# Patient Record
Sex: Female | Born: 1935 | Race: Black or African American | Hispanic: No | Smoking: Never smoker
Health system: Southern US, Community
[De-identification: ages and names within clinical notes are randomized; demographics above are authoritative.]

## PROBLEM LIST (undated history)

## (undated) ENCOUNTER — Emergency Department (HOSPITAL_COMMUNITY): Disposition: A | Discharge status: Home / Self Care | Payer: PRIVATE HEALTH INSURANCE

## (undated) DIAGNOSIS — M1990 Unspecified osteoarthritis, unspecified site: Secondary | ICD-10-CM

## (undated) DIAGNOSIS — I4719 Other supraventricular tachycardia: Secondary | ICD-10-CM

## (undated) DIAGNOSIS — I872 Venous insufficiency (chronic) (peripheral): Secondary | ICD-10-CM

## (undated) DIAGNOSIS — I4729 Other ventricular tachycardia: Secondary | ICD-10-CM

## (undated) DIAGNOSIS — I4821 Permanent atrial fibrillation: Secondary | ICD-10-CM

## (undated) DIAGNOSIS — E7800 Pure hypercholesterolemia, unspecified: Secondary | ICD-10-CM

## (undated) DIAGNOSIS — J189 Pneumonia, unspecified organism: Secondary | ICD-10-CM

## (undated) DIAGNOSIS — Z9981 Dependence on supplemental oxygen: Secondary | ICD-10-CM

## (undated) DIAGNOSIS — I3139 Other pericardial effusion (noninflammatory): Secondary | ICD-10-CM

## (undated) DIAGNOSIS — M301 Polyarteritis with lung involvement [Churg-Strauss]: Secondary | ICD-10-CM

## (undated) DIAGNOSIS — M1120 Other chondrocalcinosis, unspecified site: Secondary | ICD-10-CM

## (undated) DIAGNOSIS — I776 Arteritis, unspecified: Secondary | ICD-10-CM

## (undated) DIAGNOSIS — I10 Essential (primary) hypertension: Secondary | ICD-10-CM

## (undated) DIAGNOSIS — E876 Hypokalemia: Secondary | ICD-10-CM

## (undated) DIAGNOSIS — E0500 Thyrotoxicosis with diffuse goiter without thyrotoxic crisis or storm: Secondary | ICD-10-CM

## (undated) DIAGNOSIS — I509 Heart failure, unspecified: Secondary | ICD-10-CM

## (undated) DIAGNOSIS — J90 Pleural effusion, not elsewhere classified: Secondary | ICD-10-CM

## (undated) DIAGNOSIS — I5042 Chronic combined systolic (congestive) and diastolic (congestive) heart failure: Secondary | ICD-10-CM

## (undated) DIAGNOSIS — I471 Supraventricular tachycardia: Secondary | ICD-10-CM

## (undated) DIAGNOSIS — Z8489 Family history of other specified conditions: Secondary | ICD-10-CM

## (undated) DIAGNOSIS — I313 Pericardial effusion (noninflammatory): Secondary | ICD-10-CM

## (undated) DIAGNOSIS — E119 Type 2 diabetes mellitus without complications: Secondary | ICD-10-CM

## (undated) DIAGNOSIS — I38 Endocarditis, valve unspecified: Secondary | ICD-10-CM

## (undated) DIAGNOSIS — I272 Pulmonary hypertension, unspecified: Secondary | ICD-10-CM

## (undated) DIAGNOSIS — I472 Ventricular tachycardia: Secondary | ICD-10-CM

## (undated) DIAGNOSIS — D7218 Eosinophilia in diseases classified elsewhere: Secondary | ICD-10-CM

## (undated) DIAGNOSIS — M8580 Other specified disorders of bone density and structure, unspecified site: Secondary | ICD-10-CM

## (undated) DIAGNOSIS — R0602 Shortness of breath: Secondary | ICD-10-CM

## (undated) HISTORY — DX: Other chondrocalcinosis, unspecified site: M11.20

## (undated) HISTORY — DX: Arteritis, unspecified: I77.6

## (undated) HISTORY — DX: Thyrotoxicosis with diffuse goiter without thyrotoxic crisis or storm: E05.00

## (undated) HISTORY — PX: CATARACT EXTRACTION: SUR2

## (undated) HISTORY — DX: Venous insufficiency (chronic) (peripheral): I87.2

## (undated) HISTORY — PX: ABDOMINAL HYSTERECTOMY: SHX81

## (undated) HISTORY — DX: Other specified disorders of bone density and structure, unspecified site: M85.80

## (undated) HISTORY — DX: Polyarteritis with lung involvement (churg-strauss): M30.1

## (undated) HISTORY — PX: SALPINGOOPHORECTOMY: SHX82

## (undated) HISTORY — DX: Unspecified osteoarthritis, unspecified site: M19.90

## (undated) HISTORY — PX: THYROIDECTOMY: SHX17

## (undated) HISTORY — DX: Hypokalemia: E87.6

## (undated) HISTORY — DX: Essential (primary) hypertension: I10

## (undated) HISTORY — PX: FRACTURE SURGERY: SHX138

## (undated) HISTORY — DX: Pure hypercholesterolemia, unspecified: E78.00

## (undated) HISTORY — DX: Eosinophilia in diseases classified elsewhere: D72.18

---
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
## 1998-03-14 ENCOUNTER — Ambulatory Visit (HOSPITAL_COMMUNITY): Admission: RE | Admit: 1998-03-14 | Discharge: 1998-03-14 | Payer: Self-pay | Admitting: Specialist

## 1998-03-19 ENCOUNTER — Encounter: Admission: RE | Admit: 1998-03-19 | Discharge: 1998-03-19 | Payer: Self-pay | Admitting: Internal Medicine

## 1998-03-21 ENCOUNTER — Encounter: Admission: RE | Admit: 1998-03-21 | Discharge: 1998-03-21 | Payer: Self-pay | Admitting: Hematology and Oncology

## 1998-04-24 ENCOUNTER — Encounter: Admission: RE | Admit: 1998-04-24 | Discharge: 1998-04-24 | Payer: Self-pay | Admitting: Internal Medicine

## 1998-08-21 ENCOUNTER — Encounter: Admission: RE | Admit: 1998-08-21 | Discharge: 1998-08-21 | Payer: Self-pay | Admitting: Hematology and Oncology

## 1998-11-03 ENCOUNTER — Inpatient Hospital Stay (HOSPITAL_COMMUNITY): Admission: EM | Admit: 1998-11-03 | Discharge: 1998-11-06 | Payer: Self-pay | Admitting: Emergency Medicine

## 1998-11-03 ENCOUNTER — Encounter: Payer: Self-pay | Admitting: Emergency Medicine

## 1998-11-04 ENCOUNTER — Encounter: Payer: Self-pay | Admitting: Internal Medicine

## 1998-11-06 ENCOUNTER — Encounter: Payer: Self-pay | Admitting: Internal Medicine

## 1998-12-26 ENCOUNTER — Encounter: Admission: RE | Admit: 1998-12-26 | Discharge: 1998-12-26 | Payer: Self-pay | Admitting: Internal Medicine

## 1999-04-10 ENCOUNTER — Encounter: Admission: RE | Admit: 1999-04-10 | Discharge: 1999-04-10 | Payer: Self-pay | Admitting: Internal Medicine

## 1999-04-17 ENCOUNTER — Encounter: Admission: RE | Admit: 1999-04-17 | Discharge: 1999-04-17 | Payer: Self-pay | Admitting: Internal Medicine

## 1999-05-18 ENCOUNTER — Encounter: Payer: Self-pay | Admitting: Emergency Medicine

## 1999-05-18 ENCOUNTER — Emergency Department (HOSPITAL_COMMUNITY): Admission: EM | Admit: 1999-05-18 | Discharge: 1999-05-18 | Payer: Self-pay | Admitting: Emergency Medicine

## 1999-05-21 ENCOUNTER — Ambulatory Visit (HOSPITAL_COMMUNITY): Admission: RE | Admit: 1999-05-21 | Discharge: 1999-05-21 | Payer: Self-pay | Admitting: *Deleted

## 1999-05-30 ENCOUNTER — Ambulatory Visit (HOSPITAL_COMMUNITY): Admission: RE | Admit: 1999-05-30 | Discharge: 1999-05-30 | Payer: Self-pay | Admitting: *Deleted

## 1999-06-26 ENCOUNTER — Emergency Department (HOSPITAL_COMMUNITY): Admission: EM | Admit: 1999-06-26 | Discharge: 1999-06-26 | Payer: Self-pay | Admitting: Emergency Medicine

## 1999-06-26 ENCOUNTER — Encounter: Payer: Self-pay | Admitting: Emergency Medicine

## 1999-06-28 ENCOUNTER — Encounter: Admission: RE | Admit: 1999-06-28 | Discharge: 1999-06-28 | Payer: Self-pay | Admitting: Internal Medicine

## 1999-07-07 ENCOUNTER — Inpatient Hospital Stay (HOSPITAL_COMMUNITY): Admission: EM | Admit: 1999-07-07 | Discharge: 1999-07-08 | Payer: Self-pay | Admitting: Emergency Medicine

## 1999-07-07 ENCOUNTER — Encounter: Payer: Self-pay | Admitting: Internal Medicine

## 1999-07-07 ENCOUNTER — Encounter: Payer: Self-pay | Admitting: Emergency Medicine

## 1999-07-08 ENCOUNTER — Encounter: Payer: Self-pay | Admitting: Internal Medicine

## 1999-08-18 ENCOUNTER — Emergency Department (HOSPITAL_COMMUNITY): Admission: EM | Admit: 1999-08-18 | Discharge: 1999-08-18 | Payer: Self-pay | Admitting: Emergency Medicine

## 1999-08-18 ENCOUNTER — Encounter: Payer: Self-pay | Admitting: Internal Medicine

## 1999-09-21 ENCOUNTER — Inpatient Hospital Stay (HOSPITAL_COMMUNITY): Admission: EM | Admit: 1999-09-21 | Discharge: 1999-09-27 | Payer: Self-pay | Admitting: Emergency Medicine

## 1999-09-21 ENCOUNTER — Encounter: Payer: Self-pay | Admitting: Emergency Medicine

## 1999-09-21 ENCOUNTER — Encounter (INDEPENDENT_AMBULATORY_CARE_PROVIDER_SITE_OTHER): Payer: Self-pay | Admitting: Specialist

## 1999-09-23 ENCOUNTER — Encounter: Payer: Self-pay | Admitting: Internal Medicine

## 1999-09-26 ENCOUNTER — Encounter: Payer: Self-pay | Admitting: Internal Medicine

## 1999-10-01 ENCOUNTER — Encounter: Admission: RE | Admit: 1999-10-01 | Discharge: 1999-10-01 | Payer: Self-pay | Admitting: Internal Medicine

## 1999-10-28 ENCOUNTER — Encounter: Admission: RE | Admit: 1999-10-28 | Discharge: 1999-10-28 | Payer: Self-pay | Admitting: *Deleted

## 1999-10-30 ENCOUNTER — Encounter: Admission: RE | Admit: 1999-10-30 | Discharge: 1999-10-30 | Payer: Self-pay | Admitting: Hematology and Oncology

## 1999-12-04 ENCOUNTER — Ambulatory Visit (HOSPITAL_COMMUNITY): Admission: RE | Admit: 1999-12-04 | Discharge: 1999-12-04 | Payer: Self-pay | Admitting: *Deleted

## 1999-12-06 ENCOUNTER — Encounter: Admission: RE | Admit: 1999-12-06 | Discharge: 1999-12-06 | Payer: Self-pay | Admitting: Internal Medicine

## 1999-12-26 ENCOUNTER — Encounter: Admission: RE | Admit: 1999-12-26 | Discharge: 1999-12-26 | Payer: Self-pay | Admitting: Hematology and Oncology

## 2000-01-02 ENCOUNTER — Encounter: Admission: RE | Admit: 2000-01-02 | Discharge: 2000-01-02 | Payer: Self-pay | Admitting: Hematology and Oncology

## 2000-02-06 ENCOUNTER — Encounter: Admission: RE | Admit: 2000-02-06 | Discharge: 2000-02-06 | Payer: Self-pay | Admitting: Internal Medicine

## 2000-02-16 HISTORY — PX: SURAL NERVE BIOPSY: SUR153

## 2000-03-02 ENCOUNTER — Encounter: Admission: RE | Admit: 2000-03-02 | Discharge: 2000-03-02 | Payer: Self-pay | Admitting: Internal Medicine

## 2000-03-02 ENCOUNTER — Encounter: Payer: Self-pay | Admitting: Internal Medicine

## 2000-03-03 ENCOUNTER — Encounter: Payer: Self-pay | Admitting: Internal Medicine

## 2000-03-03 ENCOUNTER — Inpatient Hospital Stay (HOSPITAL_COMMUNITY): Admission: EM | Admit: 2000-03-03 | Discharge: 2000-03-13 | Payer: Self-pay | Admitting: Internal Medicine

## 2000-03-04 ENCOUNTER — Encounter: Payer: Self-pay | Admitting: Internal Medicine

## 2000-03-09 ENCOUNTER — Encounter: Payer: Self-pay | Admitting: Internal Medicine

## 2000-03-12 ENCOUNTER — Encounter: Payer: Self-pay | Admitting: Internal Medicine

## 2000-03-17 ENCOUNTER — Ambulatory Visit (HOSPITAL_COMMUNITY): Admission: RE | Admit: 2000-03-17 | Discharge: 2000-03-17 | Payer: Self-pay | Admitting: Neurosurgery

## 2000-03-17 ENCOUNTER — Encounter (INDEPENDENT_AMBULATORY_CARE_PROVIDER_SITE_OTHER): Payer: Self-pay | Admitting: Specialist

## 2000-04-02 ENCOUNTER — Encounter: Admission: RE | Admit: 2000-04-02 | Discharge: 2000-04-02 | Payer: Self-pay | Admitting: Internal Medicine

## 2000-04-22 ENCOUNTER — Encounter: Admission: RE | Admit: 2000-04-22 | Discharge: 2000-04-22 | Payer: Self-pay | Admitting: Hematology and Oncology

## 2000-05-01 ENCOUNTER — Encounter: Admission: RE | Admit: 2000-05-01 | Discharge: 2000-05-01 | Payer: Self-pay | Admitting: Internal Medicine

## 2000-05-18 ENCOUNTER — Encounter: Admission: RE | Admit: 2000-05-18 | Discharge: 2000-05-18 | Payer: Self-pay | Admitting: Internal Medicine

## 2000-05-25 ENCOUNTER — Encounter: Admission: RE | Admit: 2000-05-25 | Discharge: 2000-05-25 | Payer: Self-pay | Admitting: Internal Medicine

## 2000-07-01 ENCOUNTER — Encounter: Payer: Self-pay | Admitting: *Deleted

## 2000-07-01 ENCOUNTER — Ambulatory Visit (HOSPITAL_COMMUNITY): Admission: RE | Admit: 2000-07-01 | Discharge: 2000-07-01 | Payer: Self-pay | Admitting: *Deleted

## 2000-07-30 ENCOUNTER — Encounter: Admission: RE | Admit: 2000-07-30 | Discharge: 2000-07-30 | Payer: Self-pay | Admitting: Hematology and Oncology

## 2000-08-06 ENCOUNTER — Ambulatory Visit (HOSPITAL_COMMUNITY): Admission: RE | Admit: 2000-08-06 | Discharge: 2000-08-06 | Payer: Self-pay | Admitting: Hematology and Oncology

## 2000-08-27 ENCOUNTER — Encounter: Admission: RE | Admit: 2000-08-27 | Discharge: 2000-08-27 | Payer: Self-pay | Admitting: Internal Medicine

## 2000-09-28 ENCOUNTER — Encounter: Admission: RE | Admit: 2000-09-28 | Discharge: 2000-09-28 | Payer: Self-pay | Admitting: Internal Medicine

## 2000-11-11 ENCOUNTER — Ambulatory Visit (HOSPITAL_COMMUNITY): Admission: RE | Admit: 2000-11-11 | Discharge: 2000-11-11 | Payer: Self-pay

## 2000-11-23 ENCOUNTER — Encounter: Admission: RE | Admit: 2000-11-23 | Discharge: 2000-11-23 | Payer: Self-pay | Admitting: Internal Medicine

## 2001-01-19 ENCOUNTER — Encounter: Payer: Self-pay | Admitting: *Deleted

## 2001-01-19 ENCOUNTER — Encounter: Admission: RE | Admit: 2001-01-19 | Discharge: 2001-01-19 | Payer: Self-pay | Admitting: *Deleted

## 2001-01-25 ENCOUNTER — Encounter: Admission: RE | Admit: 2001-01-25 | Discharge: 2001-01-25 | Payer: Self-pay | Admitting: Internal Medicine

## 2001-04-12 ENCOUNTER — Encounter: Admission: RE | Admit: 2001-04-12 | Discharge: 2001-04-12 | Payer: Self-pay | Admitting: Internal Medicine

## 2001-06-21 ENCOUNTER — Encounter (INDEPENDENT_AMBULATORY_CARE_PROVIDER_SITE_OTHER): Payer: Self-pay | Admitting: Internal Medicine

## 2001-07-06 ENCOUNTER — Encounter: Admission: RE | Admit: 2001-07-06 | Discharge: 2001-07-06 | Payer: Self-pay

## 2001-10-05 ENCOUNTER — Encounter: Admission: RE | Admit: 2001-10-05 | Discharge: 2001-10-05 | Payer: Self-pay | Admitting: Internal Medicine

## 2001-10-07 ENCOUNTER — Encounter: Admission: RE | Admit: 2001-10-07 | Discharge: 2001-10-07 | Payer: Self-pay | Admitting: Internal Medicine

## 2002-01-25 ENCOUNTER — Encounter: Admission: RE | Admit: 2002-01-25 | Discharge: 2002-01-25 | Payer: Self-pay | Admitting: Internal Medicine

## 2002-03-02 ENCOUNTER — Encounter: Payer: Self-pay | Admitting: Internal Medicine

## 2002-03-02 ENCOUNTER — Ambulatory Visit (HOSPITAL_COMMUNITY): Admission: RE | Admit: 2002-03-02 | Discharge: 2002-03-02 | Payer: Self-pay | Admitting: Internal Medicine

## 2002-05-04 ENCOUNTER — Encounter: Admission: RE | Admit: 2002-05-04 | Discharge: 2002-05-04 | Payer: Self-pay | Admitting: Internal Medicine

## 2002-06-01 ENCOUNTER — Encounter: Admission: RE | Admit: 2002-06-01 | Discharge: 2002-06-01 | Payer: Self-pay | Admitting: Internal Medicine

## 2002-06-01 ENCOUNTER — Encounter: Payer: Self-pay | Admitting: Cardiology

## 2002-06-01 ENCOUNTER — Ambulatory Visit (HOSPITAL_COMMUNITY): Admission: RE | Admit: 2002-06-01 | Discharge: 2002-06-01 | Payer: Self-pay | Admitting: Internal Medicine

## 2002-08-12 ENCOUNTER — Emergency Department (HOSPITAL_COMMUNITY): Admission: EM | Admit: 2002-08-12 | Discharge: 2002-08-12 | Payer: Self-pay | Admitting: Emergency Medicine

## 2002-08-19 ENCOUNTER — Encounter: Payer: Self-pay | Admitting: Emergency Medicine

## 2002-08-19 ENCOUNTER — Emergency Department (HOSPITAL_COMMUNITY): Admission: EM | Admit: 2002-08-19 | Discharge: 2002-08-19 | Payer: Self-pay | Admitting: Emergency Medicine

## 2002-11-21 ENCOUNTER — Encounter: Admission: RE | Admit: 2002-11-21 | Discharge: 2002-11-21 | Payer: Self-pay | Admitting: Internal Medicine

## 2003-04-06 ENCOUNTER — Encounter: Admission: RE | Admit: 2003-04-06 | Discharge: 2003-04-06 | Payer: Self-pay | Admitting: Internal Medicine

## 2003-04-27 ENCOUNTER — Emergency Department (HOSPITAL_COMMUNITY): Admission: AD | Admit: 2003-04-27 | Discharge: 2003-04-27 | Payer: Self-pay | Admitting: Emergency Medicine

## 2003-05-09 ENCOUNTER — Encounter: Payer: Self-pay | Admitting: Internal Medicine

## 2003-05-09 ENCOUNTER — Encounter: Admission: RE | Admit: 2003-05-09 | Discharge: 2003-05-09 | Payer: Self-pay | Admitting: Internal Medicine

## 2003-05-09 LAB — HM DEXA SCAN

## 2003-06-06 ENCOUNTER — Encounter: Admission: RE | Admit: 2003-06-06 | Discharge: 2003-06-06 | Payer: Self-pay | Admitting: Internal Medicine

## 2004-01-10 ENCOUNTER — Ambulatory Visit (HOSPITAL_COMMUNITY): Admission: RE | Admit: 2004-01-10 | Discharge: 2004-01-10 | Payer: Self-pay | Admitting: Internal Medicine

## 2004-01-10 ENCOUNTER — Encounter: Admission: RE | Admit: 2004-01-10 | Discharge: 2004-01-10 | Payer: Self-pay | Admitting: Internal Medicine

## 2004-01-26 ENCOUNTER — Ambulatory Visit (HOSPITAL_COMMUNITY): Admission: RE | Admit: 2004-01-26 | Discharge: 2004-01-26 | Payer: Self-pay | Admitting: Internal Medicine

## 2004-05-06 ENCOUNTER — Ambulatory Visit: Payer: Self-pay | Admitting: Internal Medicine

## 2004-06-03 ENCOUNTER — Ambulatory Visit: Payer: Self-pay | Admitting: Internal Medicine

## 2004-12-20 ENCOUNTER — Ambulatory Visit: Payer: Self-pay | Admitting: Internal Medicine

## 2005-01-14 ENCOUNTER — Ambulatory Visit: Payer: Self-pay | Admitting: Internal Medicine

## 2005-03-04 ENCOUNTER — Ambulatory Visit: Payer: Self-pay | Admitting: Internal Medicine

## 2005-11-10 ENCOUNTER — Ambulatory Visit: Payer: Self-pay | Admitting: Internal Medicine

## 2005-12-17 ENCOUNTER — Ambulatory Visit (HOSPITAL_COMMUNITY): Admission: RE | Admit: 2005-12-17 | Discharge: 2005-12-17 | Payer: Self-pay | Admitting: Internal Medicine

## 2006-02-06 ENCOUNTER — Emergency Department (HOSPITAL_COMMUNITY): Admission: EM | Admit: 2006-02-06 | Discharge: 2006-02-06 | Payer: Self-pay | Admitting: Emergency Medicine

## 2006-04-03 ENCOUNTER — Ambulatory Visit: Payer: Self-pay | Admitting: Internal Medicine

## 2006-07-27 ENCOUNTER — Encounter (INDEPENDENT_AMBULATORY_CARE_PROVIDER_SITE_OTHER): Payer: Self-pay | Admitting: Internal Medicine

## 2006-07-27 DIAGNOSIS — H409 Unspecified glaucoma: Secondary | ICD-10-CM

## 2006-07-27 DIAGNOSIS — M949 Disorder of cartilage, unspecified: Secondary | ICD-10-CM

## 2006-07-27 DIAGNOSIS — M899 Disorder of bone, unspecified: Secondary | ICD-10-CM | POA: Insufficient documentation

## 2006-07-27 DIAGNOSIS — E0500 Thyrotoxicosis with diffuse goiter without thyrotoxic crisis or storm: Secondary | ICD-10-CM | POA: Insufficient documentation

## 2006-07-27 DIAGNOSIS — J45909 Unspecified asthma, uncomplicated: Secondary | ICD-10-CM | POA: Insufficient documentation

## 2006-07-27 DIAGNOSIS — I428 Other cardiomyopathies: Secondary | ICD-10-CM

## 2006-07-27 DIAGNOSIS — I10 Essential (primary) hypertension: Secondary | ICD-10-CM | POA: Insufficient documentation

## 2006-07-27 DIAGNOSIS — E7800 Pure hypercholesterolemia, unspecified: Secondary | ICD-10-CM

## 2006-08-05 ENCOUNTER — Ambulatory Visit: Payer: Self-pay | Admitting: Internal Medicine

## 2006-11-02 ENCOUNTER — Telehealth: Payer: Self-pay | Admitting: *Deleted

## 2007-03-09 ENCOUNTER — Ambulatory Visit: Payer: Self-pay | Admitting: Hospitalist

## 2007-03-31 ENCOUNTER — Encounter (INDEPENDENT_AMBULATORY_CARE_PROVIDER_SITE_OTHER): Payer: Self-pay | Admitting: Hospitalist

## 2007-03-31 ENCOUNTER — Ambulatory Visit: Payer: Self-pay | Admitting: Infectious Disease

## 2007-03-31 LAB — CONVERTED CEMR LAB
BUN: 12 mg/dL (ref 6–23)
CO2: 27 meq/L (ref 19–32)
Calcium: 9.2 mg/dL (ref 8.4–10.5)
Chloride: 105 meq/L (ref 96–112)
Cholesterol: 125 mg/dL (ref 0–200)
Creatinine, Ser: 0.74 mg/dL (ref 0.40–1.20)
Eosinophils Relative: 12 % — ABNORMAL HIGH (ref 0–5)
Glucose, Bld: 109 mg/dL — ABNORMAL HIGH (ref 70–99)
Lymphocytes Relative: 44 % (ref 12–46)
MCHC: 33.3 g/dL (ref 30.0–36.0)
MCV: 92 fL (ref 78.0–100.0)
Monocytes Absolute: 0.3 10*3/uL (ref 0.2–0.7)
Platelets: 264 10*3/uL (ref 150–400)
Potassium: 3.5 meq/L (ref 3.5–5.3)
RBC: 4.14 M/uL (ref 3.87–5.11)
RDW: 12.9 % (ref 11.5–14.0)
Sodium: 140 meq/L (ref 135–145)
Total Bilirubin: 0.7 mg/dL (ref 0.3–1.2)
Total Protein: 7 g/dL (ref 6.0–8.3)

## 2007-04-27 ENCOUNTER — Ambulatory Visit (HOSPITAL_COMMUNITY): Admission: RE | Admit: 2007-04-27 | Discharge: 2007-04-27 | Payer: Self-pay | Admitting: Anesthesiology

## 2007-04-27 LAB — HM MAMMOGRAPHY

## 2007-05-10 ENCOUNTER — Telehealth: Payer: Self-pay | Admitting: *Deleted

## 2007-08-17 ENCOUNTER — Telehealth (INDEPENDENT_AMBULATORY_CARE_PROVIDER_SITE_OTHER): Payer: Self-pay | Admitting: *Deleted

## 2007-08-30 ENCOUNTER — Telehealth: Payer: Self-pay | Admitting: *Deleted

## 2007-10-22 ENCOUNTER — Telehealth: Payer: Self-pay | Admitting: *Deleted

## 2008-01-07 ENCOUNTER — Telehealth: Payer: Self-pay | Admitting: *Deleted

## 2008-04-03 ENCOUNTER — Telehealth (INDEPENDENT_AMBULATORY_CARE_PROVIDER_SITE_OTHER): Payer: Self-pay | Admitting: *Deleted

## 2008-04-19 ENCOUNTER — Ambulatory Visit: Payer: Self-pay | Admitting: Internal Medicine

## 2008-04-19 ENCOUNTER — Encounter (INDEPENDENT_AMBULATORY_CARE_PROVIDER_SITE_OTHER): Payer: Self-pay | Admitting: *Deleted

## 2008-04-19 LAB — CONVERTED CEMR LAB
Alkaline Phosphatase: 61 units/L (ref 39–117)
BUN: 14 mg/dL (ref 6–23)
Basophils Relative: 1 % (ref 0–1)
Blood Glucose, Fingerstick: 111
CO2: 24 meq/L (ref 19–32)
Chloride: 103 meq/L (ref 96–112)
HCT: 39.2 % (ref 36.0–46.0)
Hemoglobin: 13.2 g/dL (ref 12.0–15.0)
Hgb A1c MFr Bld: 7.3 %
Lymphocytes Relative: 46 % (ref 12–46)
Lymphs Abs: 2.6 10*3/uL (ref 0.7–4.0)
MCHC: 33.7 g/dL (ref 30.0–36.0)
MCV: 92 fL (ref 78.0–100.0)
Microalb Creat Ratio: 68.6 mg/g — ABNORMAL HIGH (ref 0.0–30.0)
Monocytes Absolute: 0.4 10*3/uL (ref 0.1–1.0)
Neutrophils Relative %: 38 % — ABNORMAL LOW (ref 43–77)
Platelets: 271 10*3/uL (ref 150–400)
Potassium: 3.6 meq/L (ref 3.5–5.3)
RDW: 13.1 % (ref 11.5–15.5)
TSH: 0.545 microintl units/mL (ref 0.350–4.50)
Total Bilirubin: 0.6 mg/dL (ref 0.3–1.2)

## 2008-04-20 ENCOUNTER — Encounter (INDEPENDENT_AMBULATORY_CARE_PROVIDER_SITE_OTHER): Payer: Self-pay | Admitting: *Deleted

## 2008-06-19 ENCOUNTER — Encounter (INDEPENDENT_AMBULATORY_CARE_PROVIDER_SITE_OTHER): Payer: Self-pay | Admitting: *Deleted

## 2008-06-19 ENCOUNTER — Ambulatory Visit: Payer: Self-pay | Admitting: Infectious Diseases

## 2008-06-19 LAB — CONVERTED CEMR LAB
Cholesterol: 148 mg/dL (ref 0–200)
HDL: 50 mg/dL (ref >39)
Total CHOL/HDL Ratio: 3
Triglycerides: 108 mg/dL (ref <150)

## 2008-06-20 ENCOUNTER — Encounter (INDEPENDENT_AMBULATORY_CARE_PROVIDER_SITE_OTHER): Payer: Self-pay | Admitting: *Deleted

## 2008-07-17 ENCOUNTER — Encounter (INDEPENDENT_AMBULATORY_CARE_PROVIDER_SITE_OTHER): Payer: Self-pay | Admitting: *Deleted

## 2008-07-17 ENCOUNTER — Ambulatory Visit: Payer: Self-pay | Admitting: Infectious Diseases

## 2008-07-17 LAB — CONVERTED CEMR LAB
Bilirubin Urine: NEGATIVE
Glucose, Urine, Semiquant: NEGATIVE
Hgb A1c MFr Bld: 7.6 %
Nitrite: NEGATIVE
Protein, U semiquant: NEGATIVE
Specific Gravity, Urine: <1.005
Urobilinogen, UA: 0.2
WBC Urine, dipstick: NEGATIVE

## 2008-07-21 ENCOUNTER — Telehealth: Payer: Self-pay | Admitting: *Deleted

## 2008-11-23 ENCOUNTER — Telehealth (INDEPENDENT_AMBULATORY_CARE_PROVIDER_SITE_OTHER): Payer: Self-pay | Admitting: *Deleted

## 2008-12-27 ENCOUNTER — Ambulatory Visit: Payer: Self-pay | Admitting: Infectious Disease

## 2008-12-27 ENCOUNTER — Encounter (INDEPENDENT_AMBULATORY_CARE_PROVIDER_SITE_OTHER): Payer: Self-pay | Admitting: *Deleted

## 2008-12-27 LAB — CONVERTED CEMR LAB: Hgb A1c MFr Bld: 7.6 %

## 2009-01-01 LAB — CONVERTED CEMR LAB
Calcium: 9.7 mg/dL (ref 8.4–10.5)
GFR calc Af Amer: >60 mL/min (ref >60)
GFR calc non Af Amer: >60 mL/min (ref >60)
TSH: 0.742 microintl units/mL (ref 0.350–4.500)

## 2009-01-10 ENCOUNTER — Telehealth (INDEPENDENT_AMBULATORY_CARE_PROVIDER_SITE_OTHER): Payer: Self-pay | Admitting: *Deleted

## 2009-01-23 ENCOUNTER — Ambulatory Visit: Payer: Self-pay | Admitting: Internal Medicine

## 2009-01-23 LAB — CONVERTED CEMR LAB: Blood Glucose, Fingerstick: 102

## 2009-07-07 ENCOUNTER — Emergency Department (HOSPITAL_COMMUNITY): Admission: EM | Admit: 2009-07-07 | Discharge: 2009-07-07 | Payer: Self-pay | Admitting: Emergency Medicine

## 2009-07-18 ENCOUNTER — Ambulatory Visit: Payer: Self-pay | Admitting: Infectious Disease

## 2009-07-18 LAB — CONVERTED CEMR LAB
ALT: <8 units/L (ref 0–35)
AST: 10 units/L (ref 0–37)
Albumin: 4.4 g/dL (ref 3.5–5.2)
Blood Glucose, Fingerstick: 99
Chloride: 101 meq/L (ref 96–112)
Hgb A1c MFr Bld: 7 %
Microalb Creat Ratio: 24 mg/g (ref 0.0–30.0)
Microalb, Ur: 0.5 mg/dL (ref 0.00–1.89)
Sodium: 141 meq/L (ref 135–145)
Total Bilirubin: 0.6 mg/dL (ref 0.3–1.2)
Total Protein: 7.4 g/dL (ref 6.0–8.3)

## 2009-11-24 ENCOUNTER — Encounter: Payer: Self-pay | Admitting: Internal Medicine

## 2009-11-24 ENCOUNTER — Ambulatory Visit: Payer: Self-pay | Admitting: Infectious Diseases

## 2009-11-24 ENCOUNTER — Inpatient Hospital Stay (HOSPITAL_COMMUNITY): Admission: EM | Admit: 2009-11-24 | Discharge: 2009-11-24 | Payer: Self-pay | Admitting: Emergency Medicine

## 2009-12-06 ENCOUNTER — Ambulatory Visit: Payer: Self-pay | Admitting: Internal Medicine

## 2009-12-17 ENCOUNTER — Telehealth: Payer: Self-pay | Admitting: Internal Medicine

## 2010-07-29 ENCOUNTER — Encounter: Payer: Self-pay | Admitting: Internal Medicine

## 2010-09-08 ENCOUNTER — Encounter: Payer: Self-pay | Admitting: Hospitalist

## 2010-09-17 NOTE — Miscellaneous (Signed)
Summary: [facility] Discharge  Date of admission: 11/24/2009  Date of discharge: 11/24/2009  Brief reason for admission/active problems:  Ms. Heiner is a pleasant 77 year old woman with PMH of Asthma, CHF, DM, HTN, HLD, and pseudogoutwho was admitted to the hospital for neck pain and whitish productive cough and CXR suspects bilateral PNA. during the hospitalization, she does not have fever, WBC wnl, she feels good, so she will be discharged to home and continue antibiotics for 10 days.  Followup needed:  She will call outpatient Clinic ext week for appointment after she finishes her antibiotic. At that time, need to check her symptoms and may need CXR if symptoms no improvement.     The medication and problem lists have been updated.  Please see the dictated discharge summary for details.    Updated Prior Medication List: KLOR-CON M20 20 MEQ CR-TABS (POTASSIUM CHLORIDE CRYS CR) Take 1 tablet by mouth once a day HYDROCHLOROTHIAZIDE 25 MG TABS (HYDROCHLOROTHIAZIDE) Take one tablet by mouth once daily LOTENSIN 40 MG TABS (BENAZEPRIL HCL) Take 1 tablet by mouth two times a day AMLODIPINE BESYLATE 10 MG  TABS (AMLODIPINE BESYLATE) Take 1 tablet by mouth once a day ASPIRIN 81 MG  TBEC (ASPIRIN) one by mouth every day PREVACID 30 MG CPDR (LANSOPRAZOLE) Take 1 capsule by mouth once a day METFORMIN HCL 1000 MG TABS (METFORMIN HCL) Take 1 tablet by mouth two times a day SIMVASTATIN 40 MG TABS (SIMVASTATIN) Take 1 tablet by mouth once a day ADVAIR DISKUS 100-50 MCG/DOSE MISC (FLUTICASONE-SALMETEROL) every twelve hours daily SPIRIVA HANDIHALER 18 MCG CAPS (TIOTROPIUM BROMIDE MONOHYDRATE) one puff inhaled daily OSCAL 500/200 D-3 500-200 MG-UNIT TABS (CALCIUM-VITAMIN D) one tab three times daily with meals ISOPTO ATROPINE 1 % SOLN (ATROPINE SULFATE) 1 drop Left eye once daily ALPHAGAN P 0.1 % SOLN (BRIMONIDINE TARTRATE) 1 drop each eye every 12 hours PREDNISOLONE ACETATE 1 % SUSP  (PREDNISOLONE ACETATE) 1 drop left eye every other day TRUSOPT  SOLN (DORZOLAMIDE HCL SOLN)  AVELOX 400 MG TABS (MOXIFLOXACIN HCL) Take 1 tablet by mouth once a day for 10 days  Current Allergies: No known allergies  Prescriptions: AVELOX 400 MG TABS (MOXIFLOXACIN HCL) Take 1 tablet by mouth once a day for 10 days  #10 x 0   Entered and Authorized by:   Jackson Latino MD   Signed by:   Jackson Latino MD on 11/24/2009   Method used:   Electronically to        [facility] [address] #[id number]* (retail)       [address] [place], [place]  [address]       Ph: [phone]       Fax: [phone]   RxID:   [id number]    Patient Instructions: 1)  Please call outpatient Clinic at [phone] next week for appointment in 2 weeks after she finishes her avelox.  2)  If your symptoms become worsening, please call Clinic for appointment earlier.

## 2010-09-17 NOTE — Initial Assessments (Signed)
INTERNAL MEDICINE ADMISSION HISTORY AND PHYSICAL  First Contact: Dr. Tobie Lords [phone] Second Contact: Dr. Threasa Beards [phone]  PCP: Dr. Eben Burow  CC: Cough  HPI: [name] is a pleasant 77 year old woman with pmh significant for Asthma, CHF, DM, HTN, HLD, and pseudogout, who presents to the ED for neck pain and coughing spells that first began 1 day PTA. She states her pain began when she woke up, and said her pain was worse when she turned her head to the right. No fevers, or chills. No HA, chest pain, shortness of breath, abdominal pain, N/V, changes in bowel habits, and urinary complaints. Patient does mention having coughing spells, with productive white sputum, for approximately 5 days. Cough is worse at night. No sick contacts. No recent travel.   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 Cataract, left eye s/p removal 7/99 Grave's Disease, s/p partial thyroidectomy at age 33 Goiter, TSH 0.56 3/07 Chronic venous insufficiency Dilated cardiomyopathy Echo 2003 which revealed EF of 50-55%   MEDICATIONS:  KLOR-CON M20 20 MEQ CR-TABS  Take 1 tablet by mouth once a day HYDROCHLOROTHIAZIDE 25 MG TABS (HYDROCHLOROTHIAZIDE) Take one tablet by mouth once daily LOTENSIN 40 MG TABS (BENAZEPRIL HCL) Take 1 tablet by mouth two times a day AMLODIPINE BESYLATE 10 MG  TABS (AMLODIPINE BESYLATE) Take 1 tablet by mouth once a day ASPIRIN 81 MG  TBEC (ASPIRIN) one by mouth every day PREVACID 30 MG CPDR (LANSOPRAZOLE) Take 1 capsule by mouth once a day METFORMIN HCL 1000 MG TABS (METFORMIN HCL) Take 1 tablet by mouth two times a day SIMVASTATIN 40 MG TABS (SIMVASTATIN) Take 1 tablet by mouth once a day ADVAIR DISKUS 100-50 MCG/DOSE MISC (FLUTICASONE-SALMETEROL) every twelve hours daily SPIRIVA HANDIHALER 18 MCG CAPS (TIOTROPIUM BROMIDE MONOHYDRATE) one puff  inhaled daily OSCAL 500/200 D-3 500-200 MG-UNIT TABS (CALCIUM-VITAMIN D) one tab three times daily with meals ISOPTO ATROPINE 1 % SOLN (ATROPINE SULFATE) 1 drop Left eye once daily ALPHAGAN P 0.1 % SOLN (BRIMONIDINE TARTRATE) 1 drop each eye every 12 hours PREDNISOLONE ACETATE 1 % SUSP (PREDNISOLONE ACETATE) 1 drop left eye every other day TRUSOPT  SOLN (DORZOLAMIDE HCL SOLN)    SOCIAL HISTORY:  Lives in house, with family just next door. never smoker no alcohol.   FAMILY HISTORY Non-contributory  ROS:As per HPI  VITALS: T: 100.1 P:91  BP:122/77  R:18  O2SAT: 93 ON:RA  PHYSICAL EXAM: General:  alert, well-developed, and cooperative to examination.   Head:  normocephalic and atraumatic.   Eyes:  vision grossly intact, pupils equal, pupils round, pupils reactive to light, no injection and anicteric.   Mouth:  MMM, mildly erythematous pharynx, and no exudates.   Neck:  supple, full ROM, no thyromegaly, no JVD, and no carotid bruits.   Lungs:  fair air movement, coarse breath sounds, no wheezing and no crackles Heart:  tachy regular, no murmur, no gallop, and no rub.   Abdomen:  soft, non-tender, normal bowel sounds, no distention, no guarding, no rebound tenderness, no hepatomegaly, and no splenomegaly.   Msk:  no joint swelling, no joint warmth, and no redness over joints.   Pulses:  2+ DP/PT pulses bilaterally Extremities:  No cyanosis, clubbing, edema, Chronic pigmentation changes bilaterally in tibial areas Neurologic:  alert & oriented X3, cranial nerves II-XII intact, strength normal in all extremities,  Negative Brudinski and Kernig's sign.  LABS:  I-STAT-Comment                           SEE NOTE.    NOTIFIED PHYSICIAN  TCO2                                     27                0-100            mmol/L  Ionized Calcium                          1.05       l      1.12-1.32        mmol/L  Hemoglobin (HGB)                         12.2              12.0-15.0        g/dL   Hematocrit (HCT)                         36.0              36.0-46.0        %  Sodium (NA)                              136               135-145          mEq/L  Potassium (K)                            2.6        L      3.5-5.1          mEq/L  Chloride                                 99                96-112           mEq/L  Glucose                                  174        h      70-99            mg/dL  BUN                                      6                 6-23             mg/dL  Creatinine                               0.6  0.4-1.2          mg/dL     WBC                                      8.2               4.0-10.5         K/uL  RBC                                      3.75       l      3.87-5.11        MIL/uL  Hemoglobin (HGB)                         12.1              12.0-15.0        g/dL  Hematocrit (HCT)                         35.9       l      36.0-46.0        %  MCV                                      95.7              78.0-100.0       fL  MCHC                                     33.7              30.0-36.0        g/dL  RDW                                      12.6              11.5-15.5        %  Platelet Count (PLT)                     338               150-400          K/uL  Neutrophils, %                           70                43-77            %  Lymphocytes, %                           17                12-46            %  Monocytes, %  10                3-12             %  Eosinophils, %                           2                 0-5              %  Basophils, %                             1                 0-1              %  Neutrophils, Absolute                    5.8               1.7-7.7          K/uL  Lymphocytes, Absolute                    1.4               0.7-4.0          K/ul  Monocytes, Absolute                      0.8               0.1-1.0          K/uL  Eosinophils, Absolute                    0.2                0.0-0.7          K/uL  Basophils, Absolute                      0.0               0.0-0.1          K/uL  CXR  1.  Bibasilar infiltrates, suspicious for pneumonia.  Radiographic   followup recommended to confirm resolution.   2.  Mild cardiomegaly and probable COPD.   ASSESSMENT AND PLAN:  1) Cough - Differential diagnosis including CAP and Asthma exacerbation, however patient not wheezing and not short of breath, therefore asthma unlikely. CXR consistent with bibasilar infiltrates and patient did present with low grade fever. WBC is stable. Acute CHF exacerbation also on differential, however very low on the differential as no vascular congestion seen on CXR, and also no clinical signs of CHF.   Plan: -Start patient on Rocephin and Azithro for CAP -Check urine legionella and strep ag, sputum cx, and blood cx  2) Neck pain - likely MSK as patient awoke with pain. Kernig sign, Brudinski sign sign negative. Neck pain resolved when patient was seen. On physical exam patient had full ROM, without difficulty or pain.   3) HTN - Will resume home meds, Benezepril and Norvasc.   4) DM - Last A1C 7 from 07/2009. Will check A1C, hold metformin and start patient on SSI.  5) HLD - Last lipid panel 06/2008, therefore will check FLP and  restart statin.  6) Hypokalemia - likely secondary to hypertensive regimen and not taking potassium supplements. Will replete as needed, check magnesium and recheck K in am.   7) VTE PROPH: lovenox    Attending Physician:  I performed and/or observed a history and physical examination of the patient.  I discussed the case with the residents as noted and reviewed the residents' notes.  I agree with the findings and plan--please refer to the attending physician note for more details.  Signature  Printed Name

## 2010-09-17 NOTE — Progress Notes (Signed)
Summary: med refill/gp  Phone Note Refill Request Message from:  Fax from Pharmacy on Dec 17, 2009 11:38 AM  Refills Requested: Medication #1:  KLOR-CON M20 20 MEQ CR-TABS Take 1 tablet by mouth once a day   Dosage confirmed as above?Dosage Confirmed   Brand Name Necessary? No   Supply Requested: 1 year   Last Refilled: 01/01/2009  Method Requested: Electronic Initial call taken by: Chinita Pester RN,  Dec 17, 2009 11:38 AM  Follow-up for Phone Call        Refill approved-nurse to complete Follow-up by: Lars Mage MD,  Dec 18, 2009 8:49 AM     Appended Document: med refill/gp    Prescriptions: KLOR-CON M20 20 MEQ CR-TABS (POTASSIUM CHLORIDE CRYS CR) Take 1 tablet by mouth once a day  #30 x 11   Entered and Authorized by:   Lars Mage MD   Signed by:   Lars Mage MD on 12/18/2009   Method used:   Electronically to        [id number]* (retail)       [address] [place], [place]  [address]       Ph: [phone]       Fax: [phone]   RxID:   [id number]

## 2010-09-17 NOTE — Assessment & Plan Note (Signed)
Summary: ERFU FOR PNUEMONIA   Vital Signs:  Patient profile:   77 year old female Height:      63 inches Weight:      168.8 pounds BMI:     30.01 Temp:     99.0 degrees F oral Pulse rate:   91 / minute BP sitting:   109 / 70  Vitals Entered By: Filomena Jungling NT II (December 06, 2009 8:59 AM) CC: HFU Nutritional Status BMI of > 30 = obese  Does patient need assistance? Functional Status Self care Ambulation Normal   Primary Care Provider:  Lars Mage MD  CC:  HFU.  History of Present Illness: [name] is a 77 yo lady with PMH as outliined in the EMR comes today for a hospital f/u for PNA.   1: PNA: pt has completed her course of abx with resolution of her symptoms.  She denies any persitant cough, SOB, fever, and chills.    2. C/O left shoulder and arm pain: pt reports intermittent, achy pain in her left shoulder of moderate intensity.  She has not tried any OTC meds and feels the pain is currently tolerable.  SHe denies any trauma, falls, tingling, numbess, and weakness in her extremities.  She has no other concerns/complaints today.    Preventive Screening-Counseling & Management  Alcohol-Tobacco     Smoking Status: quit     Year Quit: MANAY YEARS AGO  Caffeine-Diet-Exercise     Does Patient Exercise: yes     Type of exercise: YARD WORK     Times/week: 1-2  Current Problems (verified): 1)  Pseudogout  (ICD-275.49) 2)  Hypertension  (ICD-401.9) 3)  Diabetes Mellitus, Type II  (ICD-250.00) 4)  Asthma  (ICD-493.90) 5)  Hypokalemia  (ICD-276.8) 6)  Unspecified Cardiac Dysrhythmia  (ICD-427.9) 7)  Vasculitis  (ICD-447.6) 8)  Total Hysterectomy and Bilateral Salpingoopherectomy, Hx of  (ICD-V45.77) 9)  Grave's Disease  (ICD-242.00) 10)  Thyroidectomy, Hx of  (ICD-V45.79) 11)  Cataract, Left Eye  (ICD-366.9) 12)  Glaucoma  (ICD-365.9) 13)  Hypercholesterolemia  (ICD-272.0) 14)  Venous Insufficiency, Chronic  (ICD-459.81) 15)  Cardiomyopathy, Dilated   (ICD-425.4) 16)  Lipoma Nec  (ICD-214.8) 17)  Osteopenia  (ICD-733.90) 18)  Uti  (ICD-599.0) 19)  Urinary Frequency  (ICD-788.41)  Current Medications (verified): 1)  Klor-Con M20 20 Meq Cr-Tabs (Potassium Chloride Crys Cr) .... Take 1 Tablet By Mouth Once A Day 2)  Hydrochlorothiazide 25 Mg Tabs (Hydrochlorothiazide) .... Take One Tablet By Mouth Once Daily 3)  Lotensin 40 Mg Tabs (Benazepril Hcl) .... Take 1 Tablet By Mouth Two Times A Day 4)  Amlodipine Besylate 10 Mg  Tabs (Amlodipine Besylate) .... Take 1 Tablet By Mouth Once A Day 5)  Aspirin 81 Mg  Tbec (Aspirin) .... One By Mouth Every Day 6)  Prevacid 30 Mg Cpdr (Lansoprazole) .... Take 1 Capsule By Mouth Once A Day 7)  Metformin Hcl 1000 Mg Tabs (Metformin Hcl) .... Take 1 Tablet By Mouth Two Times A Day 8)  Simvastatin 40 Mg Tabs (Simvastatin) .... Take 1 Tablet By Mouth Once A Day 9)  Advair Diskus 100-50 Mcg/dose Misc (Fluticasone-Salmeterol) .... Every Twelve Hours Daily 10)  Spiriva Handihaler 18 Mcg Caps (Tiotropium Bromide Monohydrate) .... One Puff Inhaled Daily 11)  Oscal 500/200 D-3 500-200 Mg-Unit Tabs (Calcium-Vitamin D) .... One Tab Three Times Daily With Meals 12)  Isopto Atropine 1 % Soln (Atropine Sulfate) .Marland Kitchen.. 1 Drop Left Eye Once Daily 13)  Alphagan P 0.1 % Soln (  Brimonidine Tartrate) .Marland Kitchen.. 1 Drop Each Eye Every 12 Hours 14)  Prednisolone Acetate 1 % Susp (Prednisolone Acetate) .Marland Kitchen.. 1 Drop Left Eye Every Other Day 15)  Trusopt  Soln (Dorzolamide Hcl Soln)  Allergies (verified): No Known Drug Allergies  Past History:  Past medical, surgical, family and social histories (including risk factors) reviewed, and no changes noted (except as noted below).  Past Medical History: Reviewed history from 12/27/2008 and no changes required. 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  Social History: Reviewed history from 07/17/2008 and no changes required. Lives in house, with family just next door. never smoker no alcohol.  Physical Exam  General:  alert, well-developed, well-nourished, and well-hydrated.   Head:  normocephalic and atraumatic.   Eyes:  vision grossly intact.   Mouth:  pharynx pink and moist.   Neck:  supple and no masses.   Lungs:  normal respiratory effort.  lungs are clear to ascultation. Heart:  normal rate, regular rhythm, no murmur, and no gallop.   Extremities:  trace edema in bilateral lower extremities, L>R.  Neurologic:  alert & oriented X3.     Impression & Recommendations:  Problem # 1:  PNEUMONIA (ICD-486) Pts symptoms are resolved and she has completed her course of abx.   I do not feel a repeat CXR or CBC is warranted at this time; as pt is entirely asymptomatic.  Advised her to return to clinic if she develops fever, chills, productive cough, SOB, or other concerning symptoms.  The following medications were removed from the medication list:    Avelox 400 Mg Tabs (Moxifloxacin hcl) .Marland Kitchen... Take 1 tablet by mouth once a day for 10 days  Problem # 2:  SHOULDER PAIN, LEFT (ICD-719.41) Her pain is most c/w OA.  Advised her to try OTC tylenol as needed for pain and to return to clinic if the pain becomes intolerable, or she develops any weakness.  Will f/u at her next visit.   Her updated medication list for this problem includes:    Aspirin 81 Mg Tbec (Aspirin) ..... One by mouth every day  Problem # 3:  SPECIAL SCREENING FOR MALIGNANT NEOPLASMS COLON (ICD-V76.51) Discharge summary indicated need for possibe GI referral for upper EGD/colonoscopy.  Pt is without abdominal pain, heartburn, hematemesis, changes in bowel  habits, dark black stool, BRBPR, nausea, vomiting, diarrhea, fam hx of colon ca, or other GI symptoms.  She is not interested in routing screening at this time but will consider it.    Complete Medication List: 1)  Klor-con M20 20 Meq Cr-tabs (Potassium chloride crys cr) .... Take 1 tablet by mouth once a day 2)  Hydrochlorothiazide 25 Mg Tabs (Hydrochlorothiazide) .... Take one tablet by mouth once daily 3)  Lotensin 40 Mg Tabs (Benazepril hcl) .... Take 1 tablet by mouth two times a day 4)  Amlodipine Besylate 10 Mg Tabs (Amlodipine besylate) .... Take 1 tablet by mouth once a day 5)  Aspirin 81 Mg Tbec (Aspirin) .... One by mouth every day 6)  Prevacid 30 Mg Cpdr (Lansoprazole) .... Take 1 capsule by mouth once a day 7)  Metformin Hcl 1000 Mg Tabs (Metformin hcl) .... Take 1 tablet by mouth two times a day  8)  Simvastatin 40 Mg Tabs (Simvastatin) .... Take 1 tablet by mouth once a day 9)  Advair Diskus 100-50 Mcg/dose Misc (Fluticasone-salmeterol) .... Every twelve hours daily 10)  Spiriva Handihaler 18 Mcg Caps (Tiotropium bromide monohydrate) .... One puff inhaled daily 11)  Oscal 500/200 D-3 500-200 Mg-unit Tabs (Calcium-vitamin d) .... One tab three times daily with meals 12)  Isopto Atropine 1 % Soln (Atropine sulfate) .Marland Kitchen.. 1 drop left eye once daily 13)  Alphagan P 0.1 % Soln (Brimonidine tartrate) .Marland Kitchen.. 1 drop each eye every 12 hours 14)  Prednisolone Acetate 1 % Susp (Prednisolone acetate) .Marland Kitchen.. 1 drop left eye every other day 15)  Trusopt Soln (Dorzolamide hcl soln)  Patient Instructions: 1)  Please schedule a follow-up appointment as needed. 2)  Continue to take all of your medicine regularly.  3)  Take 650-1000mg  of Tylenol (ACETAMINOPHEN) every 4-6 hours as needed for relief of pain.  AVOID taking more than 4000mg   in a 24 hour period (can cause liver damage in higher doses). Prescriptions: METFORMIN HCL 1000 MG TABS (METFORMIN HCL) Take 1 tablet by mouth two times a day  #180 x  3   Entered and Authorized by:   Nelda Bucks DO   Signed by:   Nelda Bucks DO on 12/06/2009   Method used:   Electronically to        [id number]* (retail)       [address] [place], [place]  [address]       Ph: [phone]       Fax: [phone]   RxID:   [id number]   Prevention & Chronic Care Immunizations   Influenza vaccine: Historical  (06/03/2004)    Tetanus booster: Not documented    Pneumococcal vaccine: Historical  (05/04/2002)    H. zoster vaccine: Not documented  Colorectal Screening   Hemoccult: Not documented    Colonoscopy: Not documented  Other Screening   Pap smear: Normal  (06/21/2001)    Mammogram: Assessment: BIRADS 1. No specific mammographic evidence of malignancy.  [facility]    (04/27/2007)   Mammogram due: 04/2008    DXA bone density scan: Not documented   Smoking status: quit  (12/06/2009)  Diabetes Mellitus   HgbA1C: 7.0  (07/18/2009)    Eye exam: No diabetic retinopathy.   Glaucoma.  -Exfolation syndrome OU Cataract.  OD Posterier Vitreous Detachment OU exam by Dr Eulah Pont   (05/22/2008)   Eye exam due: 08/2008    Foot exam: yes  (06/19/2008)   High risk foot: Not documented   Foot care education: Not documented    Urine microalbumin/creatinine ratio: 24.0  (07/18/2009)  Lipids   Total Cholesterol: 148  (06/19/2008)   LDL: 76  (06/19/2008)   LDL Direct: Not documented   HDL: 50  (06/19/2008)   Triglycerides: 108  (06/19/2008)    SGOT (AST): 10  (07/18/2009)   SGPT (ALT): <8 U/L  (07/18/2009)   Alkaline phosphatase: 52  (07/18/2009)   Total bilirubin: 0.6  (07/18/2009)  Hypertension   Last Blood Pressure: 109 / 70  (12/06/2009)   Serum creatinine: 0.70  (07/18/2009)   Serum potassium 3.5  (07/18/2009)  Self-Management Support :   Personal Goals (by the next clinic visit) :     Personal A1C goal: 7  (07/18/2009)      Personal blood pressure goal: 140/90  (07/18/2009)     Personal LDL goal:  70  (07/18/2009)    Diabetes self-management support: Written self-care plan  (07/18/2009)    Hypertension self-management support: Written self-care plan  (07/18/2009)    Lipid self-management support: Written self-care plan  (07/18/2009)

## 2010-09-19 NOTE — Letter (Signed)
Summary: [facility]  [facility]   Imported By: Shon Hough 08/30/2010 10:53:34  _____________________________________________________________________  External Attachment:    Type:   Image     Comment:   External Document

## 2010-10-17 HISTORY — PX: HEMIARTHROPLASTY HIP: SUR652

## 2010-10-21 ENCOUNTER — Emergency Department (HOSPITAL_COMMUNITY): Payer: PRIVATE HEALTH INSURANCE

## 2010-10-21 ENCOUNTER — Encounter: Payer: Self-pay | Admitting: Internal Medicine

## 2010-10-21 ENCOUNTER — Emergency Department (HOSPITAL_COMMUNITY)
Admission: EM | Admit: 2010-10-21 | Discharge: 2010-10-22 | Disposition: A | Discharge status: Other Acute Inpatient Hospital | Payer: PRIVATE HEALTH INSURANCE | Source: Home / Self Care | Attending: Emergency Medicine | Admitting: Emergency Medicine

## 2010-10-21 DIAGNOSIS — I498 Other specified cardiac arrhythmias: Secondary | ICD-10-CM | POA: Insufficient documentation

## 2010-10-21 DIAGNOSIS — S72009A Fracture of unspecified part of neck of unspecified femur, initial encounter for closed fracture: Secondary | ICD-10-CM | POA: Insufficient documentation

## 2010-10-21 DIAGNOSIS — E876 Hypokalemia: Secondary | ICD-10-CM | POA: Insufficient documentation

## 2010-10-21 DIAGNOSIS — E119 Type 2 diabetes mellitus without complications: Secondary | ICD-10-CM | POA: Insufficient documentation

## 2010-10-21 DIAGNOSIS — Y92009 Unspecified place in unspecified non-institutional (private) residence as the place of occurrence of the external cause: Secondary | ICD-10-CM | POA: Insufficient documentation

## 2010-10-21 DIAGNOSIS — W010XXA Fall on same level from slipping, tripping and stumbling without subsequent striking against object, initial encounter: Secondary | ICD-10-CM | POA: Insufficient documentation

## 2010-10-21 DIAGNOSIS — I10 Essential (primary) hypertension: Secondary | ICD-10-CM | POA: Insufficient documentation

## 2010-10-21 LAB — PROTIME-INR
INR: 0.98 (ref 0.00–1.49)
Prothrombin Time: 13.2 seconds (ref 11.6–15.2)

## 2010-10-21 LAB — POCT I-STAT, CHEM 8
BUN: 14 mg/dL (ref 6–23)
Calcium, Ion: 1.11 mmol/L — ABNORMAL LOW (ref 1.12–1.32)
Chloride: 100 mEq/L (ref 96–112)
Glucose, Bld: 197 mg/dL — ABNORMAL HIGH (ref 70–99)
Sodium: 140 mEq/L (ref 135–145)

## 2010-10-21 LAB — DIFFERENTIAL
Basophils Absolute: 0 10*3/uL (ref 0.0–0.1)
Basophils Relative: 0 % (ref 0–1)
Eosinophils Relative: 1 % (ref 0–5)
Monocytes Absolute: 0.5 10*3/uL (ref 0.1–1.0)
Neutro Abs: 7.2 10*3/uL (ref 1.7–7.7)
Neutrophils Relative %: 84 % — ABNORMAL HIGH (ref 43–77)

## 2010-10-21 LAB — URINE MICROSCOPIC-ADD ON

## 2010-10-21 LAB — URINALYSIS, ROUTINE W REFLEX MICROSCOPIC
Bilirubin Urine: NEGATIVE
Ketones, ur: NEGATIVE mg/dL
Nitrite: NEGATIVE
Protein, ur: 100 mg/dL — AB
Specific Gravity, Urine: 1.011 (ref 1.005–1.030)
Urobilinogen, UA: 1 mg/dL (ref 0.0–1.0)

## 2010-10-21 LAB — TYPE AND SCREEN: Antibody Screen: NEGATIVE

## 2010-10-21 LAB — CBC
MCHC: 33.7 g/dL (ref 30.0–36.0)
MCV: 92.5 fL (ref 78.0–100.0)
Platelets: 260 10*3/uL (ref 150–400)
RBC: 4.14 MIL/uL (ref 3.87–5.11)
WBC: 8.6 10*3/uL (ref 4.0–10.5)

## 2010-10-22 ENCOUNTER — Inpatient Hospital Stay (HOSPITAL_COMMUNITY): Payer: PRIVATE HEALTH INSURANCE

## 2010-10-22 ENCOUNTER — Encounter: Payer: Self-pay | Admitting: Internal Medicine

## 2010-10-22 ENCOUNTER — Inpatient Hospital Stay (HOSPITAL_COMMUNITY)
Admission: EM | Admit: 2010-10-22 | Discharge: 2010-10-29 | DRG: 470 | Disposition: A | Discharge status: Skilled Nursing Facility | Payer: PRIVATE HEALTH INSURANCE | Source: Other Acute Inpatient Hospital | Attending: Internal Medicine | Admitting: Internal Medicine

## 2010-10-22 DIAGNOSIS — R29898 Other symptoms and signs involving the musculoskeletal system: Secondary | ICD-10-CM | POA: Diagnosis not present

## 2010-10-22 DIAGNOSIS — A498 Other bacterial infections of unspecified site: Secondary | ICD-10-CM | POA: Diagnosis present

## 2010-10-22 DIAGNOSIS — Z7982 Long term (current) use of aspirin: Secondary | ICD-10-CM

## 2010-10-22 DIAGNOSIS — E876 Hypokalemia: Secondary | ICD-10-CM | POA: Diagnosis present

## 2010-10-22 DIAGNOSIS — I872 Venous insufficiency (chronic) (peripheral): Secondary | ICD-10-CM | POA: Diagnosis present

## 2010-10-22 DIAGNOSIS — H409 Unspecified glaucoma: Secondary | ICD-10-CM | POA: Diagnosis present

## 2010-10-22 DIAGNOSIS — I10 Essential (primary) hypertension: Secondary | ICD-10-CM | POA: Diagnosis present

## 2010-10-22 DIAGNOSIS — E0500 Thyrotoxicosis with diffuse goiter without thyrotoxic crisis or storm: Secondary | ICD-10-CM | POA: Diagnosis present

## 2010-10-22 DIAGNOSIS — S72009A Fracture of unspecified part of neck of unspecified femur, initial encounter for closed fracture: Principal | ICD-10-CM | POA: Diagnosis present

## 2010-10-22 DIAGNOSIS — N179 Acute kidney failure, unspecified: Secondary | ICD-10-CM | POA: Diagnosis not present

## 2010-10-22 DIAGNOSIS — E119 Type 2 diabetes mellitus without complications: Secondary | ICD-10-CM | POA: Diagnosis present

## 2010-10-22 DIAGNOSIS — K5900 Constipation, unspecified: Secondary | ICD-10-CM | POA: Diagnosis not present

## 2010-10-22 DIAGNOSIS — M1990 Unspecified osteoarthritis, unspecified site: Secondary | ICD-10-CM | POA: Diagnosis present

## 2010-10-22 DIAGNOSIS — W010XXA Fall on same level from slipping, tripping and stumbling without subsequent striking against object, initial encounter: Secondary | ICD-10-CM | POA: Diagnosis present

## 2010-10-22 DIAGNOSIS — E7800 Pure hypercholesterolemia, unspecified: Secondary | ICD-10-CM | POA: Diagnosis present

## 2010-10-22 DIAGNOSIS — I428 Other cardiomyopathies: Secondary | ICD-10-CM | POA: Diagnosis present

## 2010-10-22 DIAGNOSIS — T502X5A Adverse effect of carbonic-anhydrase inhibitors, benzothiadiazides and other diuretics, initial encounter: Secondary | ICD-10-CM | POA: Diagnosis present

## 2010-10-22 DIAGNOSIS — N390 Urinary tract infection, site not specified: Secondary | ICD-10-CM | POA: Diagnosis present

## 2010-10-22 DIAGNOSIS — J45909 Unspecified asthma, uncomplicated: Secondary | ICD-10-CM | POA: Diagnosis present

## 2010-10-22 LAB — CBC
MCH: 30.9 pg (ref 26.0–34.0)
MCHC: 33.7 g/dL (ref 30.0–36.0)
Platelets: 222 10*3/uL (ref 150–400)
RDW: 12.9 % (ref 11.5–15.5)

## 2010-10-22 LAB — GLUCOSE, CAPILLARY: Glucose-Capillary: 189 mg/dL — ABNORMAL HIGH (ref 70–99)

## 2010-10-22 LAB — BASIC METABOLIC PANEL
BUN: 7 mg/dL (ref 6–23)
CO2: 28 mEq/L (ref 19–32)
Chloride: 103 mEq/L (ref 96–112)
Creatinine, Ser: 0.69 mg/dL (ref 0.4–1.2)
GFR calc non Af Amer: >60 mL/min (ref >60)
GFR calc non Af Amer: >60 mL/min (ref >60)
Glucose, Bld: 201 mg/dL — ABNORMAL HIGH (ref 70–99)
Potassium: 2.8 mEq/L — ABNORMAL LOW (ref 3.5–5.1)
Potassium: 3.1 mEq/L — ABNORMAL LOW (ref 3.5–5.1)
Sodium: 138 mEq/L (ref 135–145)

## 2010-10-22 LAB — SURGICAL PCR SCREEN: MRSA, PCR: NEGATIVE

## 2010-10-22 LAB — ABO/RH
ABO/RH(D): A POS
ABO/RH(D): A POS

## 2010-10-22 NOTE — H&P (Signed)
Hospital Admission Note Date: 10/22/2010  Patient name: [name] Medical record number: [id number] Date of birth: [birth year] Age: 77 y.o. Gender: female PCP: Lars Mage, MD  Medical Service:  Attending physician: Dr. Coralee Pesa    Pager: Resident (R2/R3): Dr. Arvilla Market                Pager: [phone] Resident (R1): Dr. Odis Luster     Pager: [phone]  Chief Complaint:  History of Present Illness: Pt is an 77 y/o female with h/o osteopenia, HTN, DM2, HL presenting with a chief complaint of fall. According to the patient, she slipped and fell while taking out her garbage and landed on her right side. She states she was in her usual state of health, denies any preceding episode of chest pain, palpitations, weakness, feeling dizzy or lightheaded, blurry vision or any other systemic symptoms. She did not hit her head nor lose consciousness. She denies any prior history of falls. She did not She lives alone and normally functional and able to carry out her ADLs independently.  On presentation to the [facility] ED, she was found to be hypokalemic to 2.5, received 80mg  po and 20mg  KCl IV repletion.   Current Outpatient Prescriptions  Medication Sig Dispense Refill  . amLODipine (NORVASC) 10 MG tablet Take 10 mg by mouth daily.        Marland Kitchen aspirin 81 MG tablet Take 81 mg by mouth daily.        Marland Kitchen atropine 1 % ophthalmic solution Place 1 drop into the left eye daily.        . benazepril (LOTENSIN) 40 MG tablet Take 40 mg by mouth daily.        . brimonidine (ALPHAGAN P) 0.1 % SOLN 1 drop each eye every 12 hours       . calcium-vitamin D (OSCAL WITH D 500-200) 500-200 MG-UNIT per tablet Take 1 tablet by mouth 3 (three) times daily. With meals       . dorzolamide (TRUSOPT) 2 % ophthalmic solution        . Fluticasone-Salmeterol (ADVAIR DISKUS) 100-50 MCG/DOSE AEPB Inhale 1 puff into the lungs every 12 (twelve) hours.        . hydrochlorothiazide 25 MG tablet Take 25 mg by mouth daily.        . lansoprazole (PREVACID) 30  MG capsule Take 30 mg by mouth daily.        . metFORMIN (GLUCOPHAGE) 1000 MG tablet Take 1,000 mg by mouth 2 (two) times daily with a meal.        . potassium chloride SA (K-DUR,KLOR-CON) 20 MEQ tablet Take 20 mEq by mouth daily.        . prednisoLONE acetate (PRED FORTE) 1 % ophthalmic suspension 1 drop left eye every other day       . simvastatin (ZOCOR) 40 MG tablet Take 40 mg by mouth at bedtime.        Marland Kitchen tiotropium (SPIRIVA) 18 MCG inhalation capsule Place 18 mcg into inhaler and inhale daily.          Allergies: NKDA  Past Medical History  Diagnosis Date  . Hypertension   . Diabetes mellitus   . Asthma   . Osteoarthritis     left shoulder  . Pseudogout   . Hypokalemia     2/2 HCTZ on chronic K oral supp  . 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 Grave's disease     s/p thyroidectomy  . Glaucoma   .  Hypercholesteremia   . Venous insufficiency   . Cardiomyopathy     Dilated CM, EF 50-55% 2003  . Osteopenia     DEXA 2004  . Lipoma     left inner thigh  . Cataract     left eye    Past Surgical History  Procedure Date  . Abdominal hysterectomy   . Salpingoophorectomy   . Thyroidectomy     No family history on file.  Social History:    Lives in house, with family just next door.never smokerno alcohol.    Review of Systems: A comprehensive review of systems was negative.  Physical Exam: Vitals:   There were no vitals filed for this visit. General appearance: alert, cooperative and no distress Head: Normocephalic, without obvious abnormality, atraumatic Eyes: PERRL, EOMI Throat: lips, mucosa, and tongue normal; teeth and gums normal Neck: no adenopathy, no carotid bruit, no JVD, supple, symmetrical, trachea midline and thyroid not enlarged, symmetric, no tenderness/mass/nodules Lungs: clear to auscultation bilaterally Heart: regular rate and rhythm, S1, S2 normal, no murmur, click, rub or gallop Abdomen: soft, non-tender; bowel  sounds normal; no masses,  no organomegaly Extremities: extremities normal, atraumatic, no cyanosis or edema. Right hip is tender to palpation, no bruising or scarring noted. Pulses: 2+ and symmetric Skin: Skin color, texture, turgor normal. No rashes or lesions Neurologic: Grossly normal  Lab results: WBC                                      8.6               4.0-10.5         K/uL  RBC                                      4.14              3.87-5.11        MIL/uL  Hemoglobin (HGB)                         12.9              12.0-15.0        g/dL  Hematocrit (HCT)                         38.3              36.0-46.0        %  MCV                                      92.5              78.0-100.0       fL  MCH -                                    31.2              26.0-34.0        pg  MCHC  33.7              30.0-36.0        g/dL  RDW                                      12.7              11.5-15.5        %  Platelet Count (PLT)                     260               150-400          K/uL  Neutrophils, %                           84         h      43-77            %  Lymphocytes, %                           9          l      12-46            %  Monocytes, %                             6                 3-12             %  Eosinophils, %                           1                 0-5              %  Basophils, %                             0                 0-1              %  Neutrophils, Absolute                    7.2               1.7-7.7          K/uL  Lymphocytes, Absolute                    0.8               0.7-4.0          K/uL  Monocytes, Absolute                      0.5               0.1-1.0          K/uL  Eosinophils, Absolute  0.1               0.0-0.7          K/uL  Basophils, Absolute                      0.0               0.0-0.1          K/uL  Smear Review                             SEE NOTE.    LARGE PLATELETS  PRESENT  TCO2                                     25                0-100            mmol/L  Ionized Calcium                          1.11       l      1.12-1.32        mmol/L  Hemoglobin (HGB)                         13.9              12.0-15.0        g/dL  Hematocrit (HCT)                         41.0              36.0-46.0        %  Sodium (NA)                              140               135-145          mEq/L  Potassium (K)                            2.5        L      3.5-5.1          MEq/L  Chloride                                 100               96-112           mEq/L  Glucose                                  197        h      70-99            mg/dL  BUN  14                6-23             mg/dL  Creatinine                               0.9               0.4-1.2          Mg/dL   Color, Urine                             YELLOW            YELLOW  Appearance                               CLEAR             CLEAR  Specific Gravity                         1.011             1.005-1.030  pH                                       7.0               5.0-8.0  Urine Glucose                            NEGATIVE          NEG              mg/dL  Bilirubin                                NEGATIVE          NEG  Ketones                                  NEGATIVE          NEG              mg/dL  Blood                                    SMALL      a      NEG  Protein                                  100        a      NEG              mg/dL  Urobilinogen                             1.0               0.0-1.0  mg/dL  Nitrite                                  NEGATIVE          NEG  Leukocytes                               NEGATIVE          NEG  Squamous Epithelial / LPF                RARE              RARE  Casts / HPF                              SEE NOTE.  a      NEG    HYALINE CASTS  WBC / HPF                                0-2               <3                WBC/hpf  RBC / HPF                                3-6               <3               RBC/hpf  Bacteria / HPF                           RARE              RARE  Protime ( Prothrombin Time)              13.2              11.6-15.2        seconds  INR                                      0.98              0.00-1.49  PTT(a-Partial Thromboplastn Time)        30                24-37            seconds   Imaging results:   RIGHT HIP - COMPLETE 2+ VIEW    Comparison: None    Findings: There is a right-sided subcapital femoral neck fracture.   Fracture fragments are slightly impacted.  No dislocation.  No   radiopaque foreign bodies or soft tissue calcifications.  There is   a scoliosis deformity and multilevel spondylosis within the lumbar   spine.    IMPRESSION:    1.  Impacted right subcapital femoral neck fracture.  CHEST - 1 VIEW    IMPRESSION:   Mild cardiomegaly and pulmonary vascular congestion.    Low lung volumes and left basilar atelectasis or scarring.  Stable chronic rightward deviation of the trachea, likely due to   longstanding goiter.  Assessment & Plan by Problem:  1. Femoral neck fracture - surgery per ortho, planned for 10/22/10 am. Concern for osteoporosis given history. May consider repeating DEXA scan on outpt basis, last one was in 2004, patient may need to begin bisphosphonate therapy to reduce risk of recurrent fractures. In the meantime, f/u ortho recs, fall precautions, IVF NS @75cc /hr and pain control, currently on Morphine 0.5mg  IV q4h and Norco 10-325mg .  2. Hypokalemia - chronic and 2/2 Hydrochlorothiazide therapy, for which she is on K oral supplementation. No recent h/o diarrhea, vomitting or reduced po intake. Will check Mg, f/u am BMET and replete as needed.  3. HTN - will hold HCTZ given low K. Continue Norvasc 10mg .  4. DM2 - will start on SSI sensitive.   5. HL - continue ASA and statin.  6. Asthma - continue inhalers.  7. DVT ppx -  Heparin.

## 2010-10-23 DIAGNOSIS — S72009A Fracture of unspecified part of neck of unspecified femur, initial encounter for closed fracture: Secondary | ICD-10-CM

## 2010-10-23 DIAGNOSIS — E876 Hypokalemia: Secondary | ICD-10-CM

## 2010-10-23 DIAGNOSIS — W19XXXA Unspecified fall, initial encounter: Secondary | ICD-10-CM

## 2010-10-23 LAB — GLUCOSE, CAPILLARY: Glucose-Capillary: 209 mg/dL — ABNORMAL HIGH (ref 70–99)

## 2010-10-23 LAB — MAGNESIUM: Magnesium: 1.6 mg/dL (ref 1.5–2.5)

## 2010-10-23 LAB — CARDIAC PANEL(CRET KIN+CKTOT+MB+TROPI)
CK, MB: 6.4 ng/mL (ref 0.3–4.0)
CK, MB: 4.9 ng/mL — ABNORMAL HIGH (ref 0.3–4.0)
Relative Index: 1.2 (ref 0.0–2.5)
Relative Index: 1.1 (ref 0.0–2.5)
Total CK: 421 U/L — ABNORMAL HIGH (ref 7–177)

## 2010-10-23 LAB — BASIC METABOLIC PANEL
CO2: 27 mEq/L (ref 19–32)
Chloride: 101 mEq/L (ref 96–112)
GFR calc Af Amer: >60 mL/min (ref >60)
Sodium: 134 mEq/L — ABNORMAL LOW (ref 135–145)

## 2010-10-23 LAB — CBC
HCT: 31.6 % — ABNORMAL LOW (ref 36.0–46.0)
MCV: 91.6 fL (ref 78.0–100.0)
RBC: 3.45 MIL/uL — ABNORMAL LOW (ref 3.87–5.11)
WBC: 7.1 10*3/uL (ref 4.0–10.5)

## 2010-10-23 LAB — URINE CULTURE: Culture  Setup Time: 201203060111

## 2010-10-23 LAB — CROSSMATCH: Antibody Screen: NEGATIVE

## 2010-10-24 LAB — PROTIME-INR
INR: 1.29 (ref 0.00–1.49)
Prothrombin Time: 16.3 seconds — ABNORMAL HIGH (ref 11.6–15.2)

## 2010-10-24 LAB — MAGNESIUM: Magnesium: 2 mg/dL (ref 1.5–2.5)

## 2010-10-24 LAB — GLUCOSE, CAPILLARY
Glucose-Capillary: 186 mg/dL — ABNORMAL HIGH (ref 70–99)
Glucose-Capillary: 153 mg/dL — ABNORMAL HIGH (ref 70–99)
Glucose-Capillary: 234 mg/dL — ABNORMAL HIGH (ref 70–99)

## 2010-10-24 LAB — CBC
Hemoglobin: 9.8 g/dL — ABNORMAL LOW (ref 12.0–15.0)
MCH: 31.5 pg (ref 26.0–34.0)
Platelets: 177 10*3/uL (ref 150–400)
RBC: 3.11 MIL/uL — ABNORMAL LOW (ref 3.87–5.11)
WBC: 6.2 10*3/uL (ref 4.0–10.5)

## 2010-10-24 LAB — BASIC METABOLIC PANEL
Calcium: 8.1 mg/dL — ABNORMAL LOW (ref 8.4–10.5)
GFR calc Af Amer: >60 mL/min (ref >60)
GFR calc non Af Amer: >60 mL/min (ref >60)
Glucose, Bld: 168 mg/dL — ABNORMAL HIGH (ref 70–99)
Potassium: 4.5 mEq/L (ref 3.5–5.1)
Sodium: 134 mEq/L — ABNORMAL LOW (ref 135–145)

## 2010-10-25 ENCOUNTER — Inpatient Hospital Stay (HOSPITAL_COMMUNITY): Payer: PRIVATE HEALTH INSURANCE

## 2010-10-25 DIAGNOSIS — S72009A Fracture of unspecified part of neck of unspecified femur, initial encounter for closed fracture: Secondary | ICD-10-CM

## 2010-10-25 DIAGNOSIS — I6330 Cerebral infarction due to thrombosis of unspecified cerebral artery: Secondary | ICD-10-CM

## 2010-10-25 LAB — GLUCOSE, CAPILLARY
Glucose-Capillary: 197 mg/dL — ABNORMAL HIGH (ref 70–99)
Glucose-Capillary: 185 mg/dL — ABNORMAL HIGH (ref 70–99)

## 2010-10-25 LAB — CBC
HCT: 30.7 % — ABNORMAL LOW (ref 36.0–46.0)
Hemoglobin: 10.5 g/dL — ABNORMAL LOW (ref 12.0–15.0)
MCH: 32 pg (ref 26.0–34.0)
MCHC: 34.2 g/dL (ref 30.0–36.0)
MCV: 93.6 fL (ref 78.0–100.0)
RBC: 3.28 MIL/uL — ABNORMAL LOW (ref 3.87–5.11)

## 2010-10-25 LAB — BASIC METABOLIC PANEL
CO2: 22 mEq/L (ref 19–32)
Calcium: 8.5 mg/dL (ref 8.4–10.5)
Chloride: 106 mEq/L (ref 96–112)
Creatinine, Ser: 0.8 mg/dL (ref 0.4–1.2)
GFR calc Af Amer: >60 mL/min (ref >60)
Sodium: 137 mEq/L (ref 135–145)

## 2010-10-25 LAB — PROTIME-INR: Prothrombin Time: 16 seconds — ABNORMAL HIGH (ref 11.6–15.2)

## 2010-10-26 LAB — GLUCOSE, CAPILLARY
Glucose-Capillary: 216 mg/dL — ABNORMAL HIGH (ref 70–99)
Glucose-Capillary: 198 mg/dL — ABNORMAL HIGH (ref 70–99)

## 2010-10-26 LAB — PROTIME-INR: Prothrombin Time: 18.4 seconds — ABNORMAL HIGH (ref 11.6–15.2)

## 2010-10-27 LAB — BASIC METABOLIC PANEL
CO2: 22 mEq/L (ref 19–32)
Calcium: 8.5 mg/dL (ref 8.4–10.5)
Chloride: 103 mEq/L (ref 96–112)
Creatinine, Ser: 1.13 mg/dL (ref 0.4–1.2)
GFR calc Af Amer: 44 mL/min — ABNORMAL LOW (ref >60)
GFR calc non Af Amer: 46 mL/min — ABNORMAL LOW (ref >60)
Glucose, Bld: 208 mg/dL — ABNORMAL HIGH (ref 70–99)
Glucose, Bld: 202 mg/dL — ABNORMAL HIGH (ref 70–99)
Sodium: 135 mEq/L (ref 135–145)
Sodium: 132 mEq/L — ABNORMAL LOW (ref 135–145)

## 2010-10-27 LAB — GLUCOSE, CAPILLARY
Glucose-Capillary: 227 mg/dL — ABNORMAL HIGH (ref 70–99)
Glucose-Capillary: 225 mg/dL — ABNORMAL HIGH (ref 70–99)

## 2010-10-27 LAB — LIPID PANEL
Cholesterol: 132 mg/dL (ref 0–200)
LDL Cholesterol: 69 mg/dL (ref 0–99)
Total CHOL/HDL Ratio: 2.9 RATIO
Triglycerides: 92 mg/dL (ref <150)
VLDL: 18 mg/dL (ref 0–40)

## 2010-10-27 LAB — URINALYSIS, ROUTINE W REFLEX MICROSCOPIC
Nitrite: NEGATIVE
Specific Gravity, Urine: 1.02 (ref 1.005–1.030)
Urobilinogen, UA: 0.2 mg/dL (ref 0.0–1.0)

## 2010-10-27 LAB — URINE MICROSCOPIC-ADD ON

## 2010-10-28 LAB — BASIC METABOLIC PANEL
BUN: 33 mg/dL — ABNORMAL HIGH (ref 6–23)
CO2: 23 mEq/L (ref 19–32)
Calcium: 8.5 mg/dL (ref 8.4–10.5)
Chloride: 101 mEq/L (ref 96–112)
Creatinine, Ser: 0.89 mg/dL (ref 0.4–1.2)
GFR calc Af Amer: >60 mL/min (ref >60)
Glucose, Bld: 179 mg/dL — ABNORMAL HIGH (ref 70–99)

## 2010-10-28 LAB — CBC
MCH: 30.5 pg (ref 26.0–34.0)
MCHC: 33.3 g/dL (ref 30.0–36.0)
MCV: 91.5 fL (ref 78.0–100.0)
Platelets: 339 10*3/uL (ref 150–400)
RDW: 13 % (ref 11.5–15.5)
WBC: 7.9 10*3/uL (ref 4.0–10.5)

## 2010-10-28 LAB — GLUCOSE, CAPILLARY
Glucose-Capillary: 262 mg/dL — ABNORMAL HIGH (ref 70–99)
Glucose-Capillary: 220 mg/dL — ABNORMAL HIGH (ref 70–99)
Glucose-Capillary: 164 mg/dL — ABNORMAL HIGH (ref 70–99)

## 2010-10-28 LAB — URINE CULTURE: Culture: NO GROWTH

## 2010-10-28 NOTE — Op Note (Signed)
[name], [name]                ACCOUNT NO.:  [id number]  MEDICAL RECORD NO.:  [id number]           PATIENT TYPE:  I  LOCATION:  4733                         FACILITY:  [facility]  PHYSICIAN:  Eulas Post, MD    DATE OF BIRTH:  [birth year]  DATE OF PROCEDURE:  10/22/2010 DATE OF DISCHARGE:                              OPERATIVE REPORT   PREOPERATIVE DIAGNOSIS:  Right hip fracture, femoral neck.  POSTOPERATIVE DIAGNOSIS:  Right hip fracture, femoral neck.  OPERATIVE PROCEDURE:  Right hip cemented hemiarthroplasty.  ANESTHESIA:  General.  ESTIMATED BLOOD LOSS:  250 mL.  ATTENDING SURGEON:  Eulas Post, MD  FIRST ASSISTANT:  Janace Litten, PA-C  OPERATIVE IMPLANTS:  DePuy [place] Basic cemented size 3 femoral fracture stem of for the hip with a size 51-mm head ball and a size +0 femoral neck with a size 4 cement restrictor and a total of two bags of DePuy CMW1 cement with a single 13-mm stem centralizer.  PREOPERATIVE INDICATIONS:  [name] is an 77 year old woman who complained of right hip pain after a fall.  She was found to have a right femoral neck fracture.  She was optimized medically and then elected for right hip hemiarthroplasty.  Risks, benefits, and alternatives were discussed before the procedure including, but not limited to risks of infection, bleeding, nerve injury, leg-length discrepancy, dislocation, the need for revision surgery, periprosthetic fracture, blood clots, blood transfusion, cardiopulmonary complications, among others and she is willing to proceed.  OPERATIVE PROCEDURE:  The patient was brought to the operating room, placed in supine position.  IV antibiotics were given.  General anesthesia administered.  She returned in the lateral decubitus position.  All bony prominences were padded.  Right hip prep and drape was performed.  Posterolateral approach was performed.  Incision was made through the capsule, and the capsule was teed.   I tagged the capsule on the external rotators with the piriformis with #2 FiberWire. The femoral neck was exposed.  I resected the proximal femoral neck with a saw.  I was just slightly less than one thumb's breadth from the lesser trochanter.  I then exposed the femoral head and removed this from the acetabulum and all bony debris was removed.  I then sized femoral head to a 51.  Proximal femur was exposed and I used a cookie cutter followed by the opening reamer and then the canal finder followed by lateralizing reamer and then sequential broaching up to a size 3.  A +0 was trialed and had excellent restoration of soft tissue tension and leg length.  The above- named components were selected.  I sized the canal and placed the restrictor and then cleaned a canal and then cemented the real prosthesis into place.  I placed slight increased anteversion of her femoral neck compared to her native anatomy.  Once the cement had cured, I placed a +0 femoral neck and head and reduced the hip and had excellent range of motion with excellent stability and minimal shuck. The wounds were irrigated copiously.  I repaired the capsule and external rotators through drill holes in the  trochanter using #2 FiberWire.  I then irrigated again and repaired the capsule with #1 followed by 2-0 for subcutaneous tissue and 4-0 Monocryl for the skin. Steri-Strips and sterile gauze was applied.  She was awakened and extubated and returned to the PACU in stable satisfactory condition. There were no complications.  She tolerated the procedure well.  Janace Litten, Orthopedic PA-C was present and scrubbed throughout the case and was critical for completion including retraction as well as exposure and holding of leg and manipulation with dislocation and reduction maneuvers.  He also assisted with closure.  There were no complications and she tolerated the procedure well.     Eulas Post, MD     JPL/MEDQ   D:  10/22/2010  T:  10/23/2010  Job:  [id number]  Electronically Signed by Teryl Lucy MD on 10/28/2010 08:42:35 AM

## 2010-10-29 DIAGNOSIS — S72009A Fracture of unspecified part of neck of unspecified femur, initial encounter for closed fracture: Secondary | ICD-10-CM

## 2010-10-29 DIAGNOSIS — W19XXXA Unspecified fall, initial encounter: Secondary | ICD-10-CM

## 2010-10-29 DIAGNOSIS — E876 Hypokalemia: Secondary | ICD-10-CM

## 2010-10-29 LAB — PROTIME-INR
INR: 2.06 — ABNORMAL HIGH (ref 0.00–1.49)
Prothrombin Time: 23.4 seconds — ABNORMAL HIGH (ref 11.6–15.2)

## 2010-10-29 LAB — POCT I-STAT 4, (NA,K, GLUC, HGB,HCT): Hemoglobin: 13.6 g/dL (ref 12.0–15.0)

## 2010-10-29 LAB — GLUCOSE, CAPILLARY: Glucose-Capillary: 247 mg/dL — ABNORMAL HIGH (ref 70–99)

## 2010-11-06 LAB — POCT I-STAT, CHEM 8
Chloride: 99 mEq/L (ref 96–112)
Creatinine, Ser: 0.6 mg/dL (ref 0.4–1.2)
Glucose, Bld: 174 mg/dL — ABNORMAL HIGH (ref 70–99)
Hemoglobin: 12.2 g/dL (ref 12.0–15.0)
Potassium: 2.6 mEq/L — CL (ref 3.5–5.1)

## 2010-11-06 LAB — URINALYSIS, ROUTINE W REFLEX MICROSCOPIC
Bilirubin Urine: NEGATIVE
Ketones, ur: NEGATIVE mg/dL
Nitrite: NEGATIVE
Specific Gravity, Urine: 1.004 — ABNORMAL LOW (ref 1.005–1.030)
Urobilinogen, UA: 1 mg/dL (ref 0.0–1.0)

## 2010-11-06 LAB — CBC
HCT: 35.9 % — ABNORMAL LOW (ref 36.0–46.0)
Hemoglobin: 12.1 g/dL (ref 12.0–15.0)
MCV: 95.7 fL (ref 78.0–100.0)
Platelets: 338 10*3/uL (ref 150–400)
RBC: 3.75 MIL/uL — ABNORMAL LOW (ref 3.87–5.11)
RDW: 12.6 % (ref 11.5–15.5)
WBC: 7.2 10*3/uL (ref 4.0–10.5)
WBC: 8.2 10*3/uL (ref 4.0–10.5)

## 2010-11-06 LAB — COMPREHENSIVE METABOLIC PANEL
ALT: <8 U/L (ref 0–35)
Alkaline Phosphatase: 74 U/L (ref 39–117)
CO2: 28 mEq/L (ref 19–32)
GFR calc non Af Amer: >60 mL/min (ref >60)
Glucose, Bld: 205 mg/dL — ABNORMAL HIGH (ref 70–99)
Potassium: 3.2 mEq/L — ABNORMAL LOW (ref 3.5–5.1)
Sodium: 135 mEq/L (ref 135–145)
Total Protein: 6.7 g/dL (ref 6.0–8.3)

## 2010-11-06 LAB — HEMOGLOBIN A1C: Hgb A1c MFr Bld: 7.3 % — ABNORMAL HIGH (ref 4.6–6.1)

## 2010-11-06 LAB — DIFFERENTIAL
Basophils Absolute: 0 10*3/uL (ref 0.0–0.1)
Basophils Relative: 1 % (ref 0–1)
Eosinophils Absolute: 0.2 10*3/uL (ref 0.0–0.7)
Eosinophils Relative: 2 % (ref 0–5)
Neutrophils Relative %: 70 % (ref 43–77)

## 2010-11-06 LAB — LIPID PANEL
Total CHOL/HDL Ratio: 3.7 RATIO
Triglycerides: 83 mg/dL (ref <150)
VLDL: 17 mg/dL (ref 0–40)

## 2010-11-06 LAB — CULTURE, BLOOD (ROUTINE X 2)

## 2010-11-06 LAB — URINE CULTURE: Colony Count: NO GROWTH

## 2010-11-06 LAB — URINE MICROSCOPIC-ADD ON

## 2010-11-06 LAB — CULTURE, RESPIRATORY W GRAM STAIN: Culture: NORMAL

## 2010-11-06 LAB — GLUCOSE, CAPILLARY: Glucose-Capillary: 171 mg/dL — ABNORMAL HIGH (ref 70–99)

## 2010-11-06 LAB — MAGNESIUM: Magnesium: 1.7 mg/dL (ref 1.5–2.5)

## 2010-11-10 NOTE — Discharge Summary (Signed)
[name], [name]                ACCOUNT NO.:  [id number]  MEDICAL RECORD NO.:  [id number]           PATIENT TYPE:  I  LOCATION:  4733                         FACILITY:  [facility]  PHYSICIAN:  Tilford Pillar, MD     DATE OF BIRTH:  11-Mar-1926  DATE OF ADMISSION:  10/22/2010 DATE OF DISCHARGE:  10/29/2010                              DISCHARGE SUMMARY   DISCHARGE DIAGNOSES: 1. Right femoral neck fracture status post right hip cemented     hemiarthroplasty. 2. Question of urinary tract infection. 3. Hypertension. 4. Diabetes mellitus type 2. 5. Asthma. 6. Osteoarthritis. 7. Hypokalemia secondary to hydrochlorothiazide. 8. Pseudogout. 9. History of Churg-Strauss syndrome. 10.Graves disease status post thyroidectomy. 11.Glaucoma. 12.Hypercholesterolemia. 13.Venous insufficiency. 14.Dilated cardiomyopathy. 15.History of cataract, left eye.  DISCHARGE MEDICATIONS: 1. Albuterol 90 mcg inhaler 1 puff inhaled every 4 hours as needed for     shortness of breath or wheezing. 2. Amlodipine 10 mg 1 tablet by mouth daily. 3. Benazepril 40 mg 1 tablet by mouth daily. 4. Colace 100 mg 1 tablet by mouth twice daily. 5. Hydrocodone/acetaminophen 10/325 mg 1-2 tablets by mouth every 4     hours as needed for pain. 6. Crestor 10 mg 1 tablet by mouth daily. 7. Warfarin 4 mg 1 tablet by mouth daily.  Check INR on Wednesday 3/14     and weekly thereafter.  Adjust for a goal INR of 2.0 to 2.5. 8. Aspirin 81 mg 1 tablet by mouth daily. 9. Advair Diskus 100/50 one puff inhaled every 12 hours. 10.Alphagan ophthalmic drops 1 drop in each eye every 12 hours. 11.Atropine ophthalmic solution 1% 1 drop in left eye daily. 12.Dorzolamide ophthalmic drops 1 drop in each eye twice daily. 13.Metformin 1000 mg 1 tablet by mouth twice daily. 14.Prednisolone acetate ophthalmic drops 1 drop in left eye every     other day.  DISPOSITION AND FOLLOWUP:  [name] was discharged from [facility] on  October 29, 2010 in stable and improved condition.  She was discharged to [facility] skilled nursing facility where she should continue to undergo intensive physical therapy as she recovers from hip replacement.  She should continue warfarin therapy for an additional 4-6 weeks for DVT prophylaxis while recovering from hip arthroplasty.  INR should be checked on Wednesday March 14 and weekly thereafter with a goal INR of 2.0 to 2.5.  At discharge, she was at goal INR, stable on a dose of 4 mg warfarin daily.  She will follow up in clinic with the orthopedics group for evaluation of her recovery from hip repair.  She will also follow up with Dr. Eben Burow in the [facility] on December 02, 2010 an 8:15 a.m. at which time a BMET should be drawn to check her potassium and creatinine.  Her hypertension and diabetes management should also be addressed at that visit.  CONSULTATIONS:  Orthopedic Surgery, Eulas Post, MD  PROCEDURES PERFORMED: 1. Two-view plain x-ray of right hip on October 21, 2010 which    demonstrated impacted right subcapital femoral neck fracture. 2. One-view chest x-ray on October 21, 2010 which  demonstrated mild     cardiomegaly and pulmonary vascular congestion, low lung volumes     and left basilar atelectasis or scarring also seen.  Stable chronic     rightward deviation of the trachea likely due to longstanding     goiter. 3. Right hip cemented hemiarthroplasty on October 22, 2010 performed by     Dr. Teryl Lucy.  There were no complications and she tolerated     the procedure well. 4. Portable x-ray of pelvis on October 22, 2010 which demonstrated right     hip arthroplasty.  No complicating features. 5. CT of head without contrast on October 25, 2010 which demonstrated no     evidence of acute intracranial abnormality, atrophy, chronic small     vessel white matter ischemic changes and remote left cerebellar     infarct observed as well as paranasal  sinusitis.  ADMISSION HISTORY:  The patient is an 77 year old woman with history of osteopenia, hypertension, type 2 diabetes mellitus, hyperlipidemia who presented with a chief complaint of fall.  According to the patient, she had slipped and fallen while taking out her garbage and landed on her right side.  She stated that she was in her usual state of health prior to this incident.  She denied any preceding episode of chest pain, palpitations, weakness, feeling dizzy or lightheaded, blurry vision or any other systemic symptoms.  She did not strike her head or lose consciousness.  She denied any prior history of falls.  She had previously been living alone and was normally functional and able to carry out her activities of daily living independently.  On presentation to the [facility] Emergency Department, she was found to be hypokalemic to 2.5, received 80 mg of KCl by mouth and 20 mg of KCl by IV.  PHYSICAL EXAMINATION ON ADMISSION:  GENERAL APPEARANCE:  Alert, cooperative in no distress. HEENT:  HEAD:  Normocephalic without obvious abnormality, atraumatic. EYES:  Pupils equal, round, and reactive to light.  Extraocular motions intact.  Throat, lips, mucosa and tongue normal.  Teeth and gums normal. NECK:  No adenopathy.  No carotid bruit.  No JVD, supple, symmetric. Trachea midline and thyroid not enlarged, symmetric.  No tenderness, mass nodules. LUNGS:  Clear to auscultation bilaterally. HEART:  Regular rate and rhythm.  S1 and S2 normal.  No murmur, click, rub or gallop. ABDOMEN:  Soft, nontender.  Bowel sounds normal.  No masses or organomegaly. EXTREMITIES:  Extremities normal, atraumatic.  No cyanosis or edema. Right hip tender to palpation.  No bruising or scarring noted.  Pulses 2+ and symmetric. SKIN:  Skin color, texture, turgor are normal.  No rashes or lesions. NEUROLOGIC:  Grossly normal.  ADMITTING LABORATORY DATA:  White blood cell count 8.6, hemoglobin  12.9, hematocrit 38.3, platelet count 260, percent neutrophils 84.  Sodium 140, potassium 2.5, chloride 100, bicarb 25, BUN 14, creatinine 0.9, glucose 197.  HOSPITAL COURSE: 1. Right femoral neck fracture status post partial right hip     arthroplasty.  The patient presented with hip pain following a fall     at home while taking out the garbage.  X-ray of right hip     demonstrated femoral neck fracture.  She underwent partial right     hip arthroplasty on October 22, 2010 by Dr. Dion Saucier.  She tolerated the     procedure well.  Pain was controlled initially with morphine     following surgery.  She was started on warfarin for DVT  prophylaxis     which she will continue for 4-6 weeks with a target INR of 2 to     2.5.  The patient was mobilized with physical therapy starting the     day following surgery.  She will need extensive physical therapy     during recovery.  At discharge, pain was well-controlled with     Vicodin.  She was discharged to [facility] for further rehabilitation and inpatient physical therapy.     She will follow up with Orthopedics. 2. Tachycardia.  The patient appeared to be in sinus tachycardia with     frequent PACs throughout admission.  Heart rate ranged from 90s-     120s both before and after surgery.  She was asymptomatic.  Cardiac     enzymes were cycled and showed no indication of myocardial injury.     EKG showed no worrisome findings of active ischemia.  This should     continue to be monitored as an outpatient. 3. Hypokalemia.  The patient was hypokalemic with a potassium of 2.5     on admission.  She had been hypokalemic in the past as well.  It     was thought that this hypokalemia was secondary to her home dose of     hydrochlorothiazide which was stopped on admission.  Potassium was     repleted and was normal at discharge.  Hydrochlorothiazide should     be removed from her home medication list.  A BMET should be  checked     at her followup visit with Dr. Eben Burow for further evaluation of     potassium homeostasis. 4. Hypertension.  The patient was previously on hydrochlorothiazide,     amlodipine, and benazepril at home.  Hydrochlorothiazide was     stopped because of hypokalemia.  She was continued on amlodipine     and benazepril.  Blood pressure was stable throughout admission.     Hypertension management should continue to be followed as an     outpatient. 5. Asthma.  The patient has a long history of asthma.  She has never     smoked.  Home regimen included Spiriva and Advair.  Spiriva was     discontinued because it was thought to not be contributing much to     her asthma control.  The patient developed wheezes on the seventh     day of admission, which resolved with a few treatments of nebulized     albuterol and ipratropium.  She was discharged on Advair and     albuterol inhalers.  Asthma should continued to be managed as an     outpatient. 6. Question of new onset left-sided weakness.  On the third day of     admission, the patient was seen by PM&R physician for possible     rehabilitation placement.  Concern was raised for possible new     onset left-sided weakness.  The patient and family confirmed that     she had noticed some decreased strength in her left arm and leg     that had begun that morning, greater than 6 hours prior to     examination.  Exam demonstrated 4+/5 strength on the left compared     to 5/5 strength on the right.  Stat head CT was ordered which did     not show any acute findings.  Suspicion for acute stroke was low;  therefore, it was thought that the patient would not benefit from     further stroke workup.  However, patient was continued on daily     aspirin therapy, and a fasting lipid panel was checked for risk     stratification, which demonstrated good lipid control (LDL 69,      HDL 45 = at goal). The patient's symptoms improved over the next few       days.  At discharge, the patient's daughter stated that she felt her      mother was at baseline medically and neurologically. 7. Acute renal insufficiency.  On the sixth day of admission, the     patient's creatinine became elevated above baseline to 1.38.  This     was felt to be most likely prerenal and the patient was treated     with 1 liter of IV fluids.  Her creatinine subsequently returned to     baseline of 0.8.  At followup with a repeat BMET should be checked. 8. Diabetes mellitus type 2.  Metformin initially held on admission.     However, this was restarted prior to discharge.  Diabetes     management should be addressed at next clinic visit. 9. Question of urinary tract infection.  Urine culture on admission     grew E. coli; however, the patient denied urinary symptoms such as     dysuria or increased urinary frequency.  However, because the     patient had some mild fevers following surgery and seemed to have     poor insight into her physical symptoms, it was decided to treat     her possibly asymptomatic bacteriuria with 3 days of ciprofloxacin.     She completed this treatment prior to discharge.  DISCHARGE VITALS:  Temperature 98.6, blood pressure 125/73, pulse 81, respiratory rate 18, oxygen saturation 92% on room air.  DISCHARGE LABS:  Sodium 134, potassium 4.1, chloride 101, bicarb 23, BUN 33, creatinine 0.89, glucose 179.    ______________________________ Whitney Post, MD   ______________________________ Tilford Pillar, MD    EB/MEDQ  D:  10/29/2010  T:  10/29/2010  Job:  [id number]  cc:   Eulas Post, MD Lars Mage, MD  Electronically Signed by Whitney Post MD on 11/08/2010 08:14:17 AM Electronically Signed by Tilford Pillar  on 11/10/2010 06:55:50 PM

## 2010-11-19 LAB — GLUCOSE, CAPILLARY: Glucose-Capillary: 99 mg/dL (ref 70–99)

## 2010-11-20 LAB — BODY FLUID CULTURE: Culture: NO GROWTH

## 2010-11-20 LAB — PROTEIN, BODY FLUID: Total protein, fluid: <3 g/dL

## 2010-11-20 LAB — SYNOVIAL CELL COUNT + DIFF, W/ CRYSTALS: Monocyte-Macrophage-Synovial Fluid: 2 % — ABNORMAL LOW (ref 50–90)

## 2010-11-20 LAB — GLUCOSE, SEROUS FLUID

## 2010-12-02 ENCOUNTER — Ambulatory Visit (INDEPENDENT_AMBULATORY_CARE_PROVIDER_SITE_OTHER): Payer: 59 | Admitting: Internal Medicine

## 2010-12-02 ENCOUNTER — Encounter: Payer: Self-pay | Admitting: Internal Medicine

## 2010-12-02 VITALS — BP 135/68 | HR 65 | Temp 98.3°F

## 2010-12-02 DIAGNOSIS — E7800 Pure hypercholesterolemia, unspecified: Secondary | ICD-10-CM

## 2010-12-02 DIAGNOSIS — E876 Hypokalemia: Secondary | ICD-10-CM

## 2010-12-02 DIAGNOSIS — I10 Essential (primary) hypertension: Secondary | ICD-10-CM

## 2010-12-02 DIAGNOSIS — E119 Type 2 diabetes mellitus without complications: Secondary | ICD-10-CM

## 2010-12-02 DIAGNOSIS — M25561 Pain in right knee: Secondary | ICD-10-CM | POA: Insufficient documentation

## 2010-12-02 DIAGNOSIS — M949 Disorder of cartilage, unspecified: Secondary | ICD-10-CM

## 2010-12-02 DIAGNOSIS — M899 Disorder of bone, unspecified: Secondary | ICD-10-CM

## 2010-12-02 DIAGNOSIS — M25569 Pain in unspecified knee: Secondary | ICD-10-CM

## 2010-12-02 LAB — GLUCOSE, CAPILLARY: Glucose-Capillary: 151 mg/dL — ABNORMAL HIGH (ref 70–99)

## 2010-12-02 LAB — POCT GLYCOSYLATED HEMOGLOBIN (HGB A1C): Hemoglobin A1C: 6.5

## 2010-12-02 MED ORDER — LISINOPRIL-HYDROCHLOROTHIAZIDE 20-25 MG PO TABS: 1.0000 | ORAL_TABLET | Freq: Every day | ORAL | Status: DC

## 2010-12-02 MED ORDER — PRAVASTATIN SODIUM 40 MG PO TABS: 40.0000 mg | ORAL_TABLET | Freq: Every day | ORAL | Status: DC

## 2010-12-02 MED ORDER — MELOXICAM 7.5 MG PO TABS: 7.5000 mg | ORAL_TABLET | Freq: Every day | ORAL | Status: DC

## 2010-12-02 NOTE — Assessment & Plan Note (Signed)
I would start patient on pravastatin 40 mg a day. Given her age and risk factors I would not be very concerned about goal LDL. Patient was agreeable to the plan. We should not check her future lipid profiles.

## 2010-12-02 NOTE — Progress Notes (Signed)
  Subjective:    Patient ID: [name], female    DOB: 23-Mar-1926, 77 y.o.   MRN: [id number]  HPI patient is 77 year old female who is a patient of mine with past medical history most significant for a recent hospitalization for hip fracture status post hemiarthroplasty. Patient comes in today for hospital followup as well as complaint of knee pain.  Patient's blood pressure is well controlled today at 135/68. I would combine her blood pressure medications to a simpler regimen.  Patient's HBa1C is 6.5 today. I would continue metformin at thousand milligrams twice a day.  The patient complains of right knee pain, pain is 5/10 at its worse, worse with walking, better with rest, unable to sleep at night due to pain sometimes, associated with swelling, cold compresses helps with swelling and pain. Pain is nonradiating. No fever, chills or localized redness noted. Patient is currently undergoing physical therapy and is meeting all her goals.  No other complaints today.    Review of Systems  Constitutional: Negative for fever, activity change and appetite change.  HENT: Negative for sore throat.   Respiratory: Negative for cough and shortness of breath.   Cardiovascular: Negative for chest pain and leg swelling.  Gastrointestinal: Negative for nausea, abdominal pain, diarrhea, constipation and abdominal distention.  Genitourinary: Negative for frequency, hematuria and difficulty urinating.  Neurological: Negative for dizziness and headaches.  Psychiatric/Behavioral: Negative for suicidal ideas and behavioral problems.       Objective:   Physical Exam  Constitutional: She is oriented to person, place, and time. She appears well-developed and well-nourished.  HENT:  Head: Normocephalic and atraumatic.  Eyes: Conjunctivae and EOM are normal. Pupils are equal, round, and reactive to light. No scleral icterus.  Neck: Normal range of motion. Neck supple. No JVD present. No thyromegaly  present.  Cardiovascular: Normal rate, regular rhythm, normal heart sounds and intact distal pulses.  Exam reveals no gallop and no friction rub.   No murmur heard. Pulmonary/Chest: Effort normal and breath sounds normal. No respiratory distress. She has no wheezes. She has no rales.  Abdominal: Soft. Bowel sounds are normal. She exhibits no distension and no mass. There is no tenderness. There is no rebound and no guarding.  Musculoskeletal: Normal range of motion. She exhibits no edema and no tenderness.  Lymphadenopathy:    She has no cervical adenopathy.  Neurological: She is alert and oriented to person, place, and time.  Psychiatric: She has a normal mood and affect. Her behavior is normal.          Assessment & Plan:

## 2010-12-02 NOTE — Assessment & Plan Note (Signed)
Patient's most recent being that was done last week at [facility]. Her potassium was 3.4. Patient is currently taking 20 mEq tablet a day. I have asked her to stop her her potassium tablets and instead change in her table salt to rock salt which is potassium chloride. I would also consider stopping patient's hydrochlorothiazide as we can probably control her blood pressure with nondiuretic meds.

## 2010-12-02 NOTE — Assessment & Plan Note (Signed)
Encouraged the patient and daughter to buy calcium and vitamin D tablets over-the-counter and take it one tablet 2-3 times a day with meals.

## 2010-12-02 NOTE — Patient Instructions (Signed)
Knee Pain The knee is a joint between your thigh and lower leg. It is made up of bones, tendons, ligaments, and cartilage. Knee pain can be caused by many things. A few causes could be injury to the knee, gout, arthritis, infections, or overuse during physical activity. HOME CARE  Only take medicine as told by your doctor.   Keep a healthy weight. Being overweight can make the knee hurt more.   Stretch before exercising or playing sports.   If there is constant knee pain, change the way you or your child exercises. Ask your doctor for advice.   Make sure shoes fit well. Choose the right shoe for the sport or activity.   Protect the knees. Wear kneepads if needed.   Rest when tired.  GET HELP IF:  There is knee pain that does not stop.   There is knee pain that does not seem to be getting better.  GET HELP RIGHT AWAY IF:  The knee joint feels hot to the touch.   You or your child has a temperature by mouth above 101F, not controlled by medicine.   Your baby is older than 3 months with a rectal temperature of 102 F (38.9 C) or higher.   Your baby is 44 months old or younger with a rectal temperature of 100.4 F (38 C) or higher.  MAKE SURE YOU:  Understand these instructions.   Will watch this condition.   Will get help right away if you or your child is not doing well or gets worse.  Document Released: 10/31/2008 Document Re-Released: 10/29/2009 [facility] Patient Information 2011 [place], [place].   Please replace your table salt with rock salt. You have been started on a pain medication at night which you should take with food. Drink plenty of fluids throughout the day.

## 2010-12-02 NOTE — Assessment & Plan Note (Addendum)
Patient's blood pressure is very well controlled on current meds. Given patient's age and risk factors I would consider relaxing the goal of blood pressure to 160 systolic. I would change the patient's blood pressure medications hydrochlorothiazide and benezepril to a combination of lisinopril 20 and hydrochlorothiazide 25 for the sake of simplifying her meds list and decreasing the number of pills that she has to take a day. Both daughter and patient were agreeable to the above change. Patient's creatinine is 0.6 and potassium is 3.4 labs checked on 11/26/2010.

## 2010-12-05 ENCOUNTER — Other Ambulatory Visit: Payer: Self-pay | Admitting: Sports Medicine

## 2010-12-05 DIAGNOSIS — S72009A Fracture of unspecified part of neck of unspecified femur, initial encounter for closed fracture: Secondary | ICD-10-CM

## 2010-12-06 ENCOUNTER — Telehealth: Payer: Self-pay | Admitting: *Deleted

## 2010-12-06 MED ORDER — HYDROCODONE-ACETAMINOPHEN 5-500 MG PO TABS: 1.0000 | ORAL_TABLET | Freq: Four times a day (QID) | ORAL | Status: DC | PRN

## 2010-12-06 NOTE — Telephone Encounter (Signed)
I reviewed Dr. Sumner Boast note from her last visit. Knee and Hip pain- I will call her in Vicodin and she will need to be scheduled for a pain management visit in text 2-3 weeks.

## 2010-12-06 NOTE — Telephone Encounter (Signed)
Pt called and states she was given meloxicam for pain during last visit ( 4/16 ), is not helping  She states the doctor  said if it's not helping he would change to something stronger. Pain in knee on right.  Pharmacy - [facility] on [address].   Do you want to order something else or wait until Monday for Dr Eben Burow to change/

## 2010-12-10 ENCOUNTER — Ambulatory Visit
Admission: RE | Admit: 2010-12-10 | Discharge: 2010-12-10 | Disposition: A | Discharge status: Home / Self Care | Payer: 59 | Source: Ambulatory Visit | Attending: Sports Medicine | Admitting: Sports Medicine

## 2010-12-10 DIAGNOSIS — S72009A Fracture of unspecified part of neck of unspecified femur, initial encounter for closed fracture: Secondary | ICD-10-CM

## 2010-12-14 ENCOUNTER — Other Ambulatory Visit: Payer: Self-pay | Admitting: Internal Medicine

## 2010-12-15 ENCOUNTER — Other Ambulatory Visit: Payer: Self-pay | Admitting: Internal Medicine

## 2010-12-26 ENCOUNTER — Encounter: Payer: Self-pay | Admitting: Internal Medicine

## 2010-12-29 ENCOUNTER — Other Ambulatory Visit: Payer: Self-pay | Admitting: Internal Medicine

## 2010-12-31 NOTE — Telephone Encounter (Signed)
Called to pharmacy 

## 2011-01-03 NOTE — Consult Note (Signed)
[facility]  Patient:    LAIKEN, SANDY                         MRN: [id number] Proc. Date: 03/09/00 Adm. Date:  [id number] Attending:  Alfonso Ramus                          Consultation Report  BRIEF HISTORY:  Ms. Furnish is a 77 year old female who was admitted to the Teaching Service because of weakness of the left foot.  On top of that this lady has an ulcer in the right foot which has not healed and it has been draining it.  She also has a history of weight loss.  As part of the complete medical workup it was found that this lady has weakness of the left foot.  She denies any sensory changes or any shooting pain whatsoever.  Her history is positive for diabetes, hypertension, hyperthyroidism, asthma, glaucoma, venous insufficiency, weight loss, congestive heart failure, hyperlipidemia.  She also has a lesion on the liver and glaucoma.  PHYSICAL EXAMINATION:  Shows that indeed she had weakness of the left foot. She has 4/5 plantar flexion and 2/5 dorsiflexion.  Sensation seemed to be normal and the straight leg raising is negative.  On the right side beside the ulcer there is no evidence of any weakness.  The patient had EMG and nerve conduction velocities and this is the main reason that they called [place].  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  Dr. Noreene Filbert from the neurological service felt that probably a muscle biopsy would be indicated to help with the diagnoses.  We are going to schedule her for this week provided that the family and the physician involved agree with him. DD:  03/09/00 TD:  03/10/00 Job: [id number]

## 2011-01-03 NOTE — Discharge Summary (Signed)
[place]. [facility]  Patient:    [name], [name]                         MRN: [id number] Adm. Date:  [id number] Disc. Date: [id number] Attending:  Madaline Guthrie Dictator:   Bernell List, MS4 CC:         Gwenlyn Found, M.D.             Jannette Fogo, M.D.                           Discharge Summary  DICTATING FOR:  Jannette Fogo, M.D.  DISCHARGE DIAGNOSES: 1. Congestive heart failure. 2. Anasarca. 3. Hypokalemia. 4. Transient ventricular tachycardia.  DISCHARGE MEDICATIONS: 1. Lotensin 20 mg 1 p.o. q.d. 2. Digoxin 0.25 mg q p.o. q.d. 3. K-Dur 20 mEq 2 tablets p.o. b.i.d. 4. Lasix 40 mg 1 tablet p.o. q.d. 5. All asthma medications as prescribed previously which include Atrovent,    Flovent, Serevent, and Accolate. 6. All eye drops as previously prescribed which included scopolamine and    prednisone.  FOLLOWUP:  The patient will return to [facility] on Tuesday, October 01, 1999, for basic metabolic panel and digoxin level. Subsequently followups will be scheduled as needed by Dr. Gwenlyn Found. 1) Biventricular congestive heart failure, to assess patients symptoms as well s abdominal and lower extremity swelling.  2) Hypokalemia.  The patient will have  basic metabolic panel drawn on October 01, 1999.  3) Transient ventricular tachycardia.  The patient will have digoxin level drawn on October 01, 1999.  PROCEDURES:  None performed during this admission.  CONSULTS:  None during this admission.  HISTORY AND PHYSICAL:  The patient is a 77 year old African-American female with previous history of asthma, type 2 diabetes, Graves disease, glaucoma, and status post TAH and BSO for dysfunctional uterine bleeding.  The patient presented with increasing bilateral leg swelling as well as abdominal distention since January  2001.  The patient denied any palpitations or chest pain.  She denied any shortness of  breath at this time.  No fevers or chills.  PHYSICAL EXAMINATION:  VITAL SIGNS:  At presentation, her blood pressure was 144/105, pulse 112, respirations 20, 97% O2 saturation on room air.  GENERAL:  The patient was very obese and in no acute distress.  Alert and oriented x 4.  CARDIAC:  She had a 3/6 mid systolic murmur at the left upper sternal border. There was JVD present of 4 cm.  LUNGS:  Mild wheezes with decreased lung sounds on the left basilar region.  ABDOMEN:  Distended with positive bowel sounds and no tenderness.  EXTREMITIES:  Revealed 3+ pitting edema bilaterally up to the level of the knees. LABORATORY DATA:  At presentation, sodium 139, potassium 4.2, chloride 103, CO2 28, BUN 11, creatinine 0.8, glucose 144.  Total protein 6.1, albumin 2.9, AST 34, ALT 10, alkaline phosphatase 97, and total bilirubin 1.8.  WBC 3.4, hemoglobin 14.1, and platelets 247.  EKG revealed sinus tachycardia with occasional SVC with left axis deviation and  left atrial enlargement, poor R wave progression.  HOSPITAL COURSE:  #1 - CONGESTIVE HEART FAILURE AND ANASARCA:  The patient initially presented with extended abdomen and 3+ pitting edema.  Chest x-ray revealed enlarged heart borde4rs with what appeared to be left-sided pleural effusion.  Radiology was consulted to perform a left thoracentesis which was ultrasound-guided, and approximately  500 cc of pooled fluid was evacuated at this time.  The fluid was  analyzed to be transudative, and cultures were negative.  No malignant cells were seen.  The patient was then taken to radiology for a 2-D echocardiogram which revealed mild left ventricular dilatation with septal akinesis and arterial wall akinesis.  Left ventricular ejection fraction was decreased to 25 to 35%, and there was moderate decrease in right ventricular ejection fraction.  There was a small pericardial effusion and a moderate to severe tricuspid regurgitation  as well as moderate pulmonary hypertension.  The patient was also taken to lower extremity  Doppler scan to rule out DVT which was negative.  The patient was started on Lasix 40 mg b.i.d.  The patient diuresed to the point of decreased abdominal distention and decreasing lower extremity edema.  Ramipril was also initiated and later increased to 2.5 mg b.i.d.   Digoxin was added to the list of medications.  At he time of discharge, the patient had progressively diuresed most of her ascitic fluid as well as lower extremity edema.  Her blood pressure remained stable at around  110/80 with pulse between 90 and 105.  The patient remained in a good respiratory state with no complaints of shortness of breath or wheezing.  #2 - HYPOKALEMIA:  The patient was initially found to have normal level of potassium at 4.2 on admission.  However, two days after admission, her potassium was decreased to 2.3, probably due to vigorous diuresis.  Her potassium was replaced with oral potassium chloride, and at time of discharge, the potassium level was normalized between 3.5 and 4.0.  The patients magnesium level was also checked and was normal at 2.1.  #3 - VENTRICULAR TACHYCARDIA:  On day #6 of hospitalization, the patient had two runs of brief episodes of ventricular tachycardia which lasted only several seconds.  The patient remained asymptomatic, not complaining of heart palpitation, chest pain, or shortness of breath with no mental alterations.  The patients electrolytes were corrected with potassium having been normalized and magnesium  being normal.  The patient was started the day before on digoxin to symptomatically improve her congestive heart failure, and this may have caused those two brief episodes of ventricular tachycardia.  However, during the subsequent days, she ad no other episodes of ventricular tachycardia, and the patient continued to remain asymptomatic.  DISCHARGE  LABORATORY DATA:   Sodium 139, potassium 3.8, chloride 100, CO2 31, BUN  16, creatinine 0.8, glucose 162, magnesium 2.1, calcium 8.8.  DISPOSITION:  The patient will be discharged to home in stable condition.  RESIDENTS:  Bernell List, MS4, and Dr. Gwenlyn Found  ATTENDING:  Madaline Guthrie, M.D. DD:  09/27/99 TD:  09/29/99 Job: [id number]

## 2011-01-03 NOTE — Discharge Summary (Signed)
[place]. [facility]  Patient:    [name]                          MRN: [id number] Adm. Date:  [id number] Disc. Date: [id number] Attending:  Danella Penton Dictator:   Adrian Prows, ____                           Discharge Summary  DATE OF BIRTH:  [birth year]  NEUROLOGIC:  Cranial nerves II-XII intact.  Motor tone 5/5 in bilateral upper extremities, 5/5 in right lower extremity, and 4/5 in left lower extremity, except left foot dorsiflexion, which was 3/5.  Sensation was decreased to coarse touch on left foot dorsum plus a decrease to vibration on the left lower extremity distal to the ankle and the right lower extremity distal to the knee, otherwise normal.  The deep tendon reflexes were decreased bilaterally at the knee and the ankle.  ADMISSION LABORATORY DATA:  White blood cell count 10.8, hemoglobin 11.6, hematocrit 34, and platelets 323.  Sodium 136, potassium 2.6, chloride 103, bicarbonate 28, BUN 14, creatinine 0.7, glucose 119.  AST 18, ALT 8, alkaline phosphatase 160, total bilirubin 0.9, total protein 7.3, albumin 2.7, calcium 8.4.  Serum digoxin level 0.5 ng/ml.  HOSPITAL COURSE:  Overall the patient remained stable and without worsening complaints during her hospitalization, however, her presenting complaints remained at discharge despite extensive work-up and she is scheduled to receive biopsy the week after discharge in hopes of uncovering the underlying cause for all of her complaints.  #1 - WEIGHT LOSS:  The patient continued to lose small amounts of weight during her hospitalization despite good p.o. intake.  Work-up for the weight loss included multiple views of her chest and abdomen, including CT and liver ultrasound, which demonstrated a cystic liver and a large thyroid goiter which had been biopsied in the past without evidence of malignancy.  Cardiovascular and thoracic surgery, D. Karle Plumber, M.D., reviewed films and  determined there was no need for further work-up of this thyroid mass as it was definitely a goiter.  Also, the spot on the left lower lobe of the lung was deemed to be resolving since a prior admission this year and without likelihood of malignancy.  Further possibility of weight loss secondary to diuresis was deemed unlikely by history.  Other work-up included serial guaiacs which were negative, stools sent for ova and parasites which was negative, acute and chronic hepatitis B and C panels negative, PPD negative x 2, blood culture negative, SPEP and UPEP negative, and HIV negative.  The patient also denied depression and did not appear depressed during her hospitalization.  Amyloidosis was also considered as a possibility, but further work-up with ______ _____ and ________ biopsy were deferred pending left leg nerve and muscle biopsy.  Cardiac cachexia was also a possibility given her poor cardiac function.  The patients CHF was treated as per #6 below.  #2 - EOSINOPHILIA:  The patients eosinophilia remained 5000-6000 absolute eosinophils throughout her hospitalization.  Negative work-up included negative Cortrosyn stimulation test for Addisons disease, biopsy of ulcer site without evidence of Churg-Strauss, negative ANCA which was testing for for Churg-Strauss, negative ova and parasites, negative neoplasm work-up as per #1, total cholesterol within normal limits, and decreasing the likelihood of cholesterol emboli as the cause as also her lack of localizing neurologic symptoms besides #3 below makes this less likely.  As medicines were also considered a possibility for eosinophilia, zafirlukast, Lasix, and ramipril, were all discontinued.  Keflex, which the patient was taking before admission, was also considered a possibility, but was less likely given that it was held throughout her hospitalization and her eosinophil count did not decline.  #3 - FOOT DROP:  The patients foot drop  did not worsen or improve during her hospitalization.  She received assistance from physical therapy throughout with good mobility.  Work-up for causes included a positive erythrocyte sedimentation rate, although this was nonspecific.  It also included negative urine toxicology for heavy metals, except for a mild increase of arsenic just beyond the upper limits of normal, which was considered not to be a likely causative of her current problem.  Negative workup for Churg-Strauss and malignancy as mentioned above.  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  Follow-up for that included a negative MRI of the lumbar spine without evidence of spinal stenosis.  The patient was scheduled to be biopsied as an outpatient and is awaiting that procedure for further evaluation of her foot drop.  #4 - LEG ULCER:  X-ray on admit ruled out osteomyelitis.  This problem was treated by wound care nursing with slight improvement and without evidence of active infection during her hospitalization.  Venous insufficiency may have been causative.  Also, the patient has diabetes, but the location and appearance of the lesion and the patients well-controlled and short-length diabetes mellitus make these two possibilities less likely.  Biopsy of the ulcer lesion was performed on March 05, 2000, and was nonspecific, in particular without evidence of for Churg-Strauss.  Malignancy work-up with a related vasculitis was considered with a negative work-up thus far as above. Nerve and muscle biopsy may turn up some evidence for an etiology of this problem, as well as those mentioned above.  #5 - HYPERTENSION:  The patients hypertension was within reasonable control, but numerous medication changes were made in order to evaluate the likelihood of eosinophilia secondary  to medications.  #6 - CONGESTIVE HEART FAILURE:  An echocardiogram on March 05, 2000, showed a 20% left ventricular ejection fraction, as well as septal and anterior  akinesis, otherwise diffuse hypokinesis.  However, the patient had no signs or symptoms of problems with worsening congestive heart failure during her stay. Spironolactone was started in an attempt to decrease her likelihood of morbidities and mortality from her recurrent congestive heart failure, but was discontinued at discharge secondary itching, which was considered possibly due to the spironolactone.  #7 - ASTHMA:  The patient had no symptoms during her stay and had good oxygen saturation despite discontinuing Accolate secondary to concerns of its causing an eosinophilia.  #8 - GLAUCOMA:  The patient was without complaints during her hospitalization with continuation of home medicines per Alcide Clever. Eulah Pont, M.D., her ophthalmologist.  #9 - DIABETES MELLITUS TYPE 2:  The blood glucose remained good on CBG checks during her stay.  Diet was changed to regular, i.e., non-ADA, and CBG were decreased in frequency after the patients glucose was demonstrated to remain well controlled without medications or insulin.  Ever after these changes, the patients glucose well controlled and she does not seem to need medication or insulin at this point.  Part of these change may be due to her decrease in weight and thus her decreased peripheral insulin resistance.  DISCHARGE LABORATORY DATA:  Total cholesterol 166, triglycerides  63, HDL 47, LDL 84.  The erythrocyte sedimentation rate on March 04, 2000, was 51.  The IgE on March 05, 2000, was 503.  Total bilirubin 0.9 on March 08, 2000, alkaline phosphatase 140, AST 22, ALT 10, total protein 7.1, albumin 2.8, GGT 44, sodium 136, potassium 3.8, chloride 100, bicarbonate 32, BUN 14, creatinine 0.5, glucose 101, calcium 8.8.  WBC 9.0 with 53% eosinophils, hemoglobin 12.1, hematocrit 35.9,  platelets 349. DD:  03/15/00 TD:  03/17/00 Job: [id number]

## 2011-01-03 NOTE — Op Note (Signed)
[place]. [facility] ([place])  Patient:    [name]                          MRN: [id number] Proc. Date: 03/17/00 Adm. Date:  [id number] Disc. Date: [id number] Attending:  Danella Penton CC:         Mena Goes. Franky Macho, M.D.   Operative Report  PREOPERATIVE DIAGNOSIS: Peripheral neuropathy, diabetes mellitus.  POSTOPERATIVE DIAGNOSIS: Peripheral neuropathy, diabetes mellitus.  OPERATION/PROCEDURE: 1. Left sural nerve biopsy. 2. Left vastus lateralis muscle biopsy.  ANESTHESIA: Local plus IV sedation.  SURGEON: Tanya Nones. Jeral Fruit, M.D.  ASSISTANTMena Goes. Franky Macho, M.D.  BRIEF CLINICAL NOTE:  The patient is a 77 year old female with weakness of the left foot. She denies any pain. Clinical history of atrophy. She has a history of cancer, as well as diabetes. The patient was seen by the physical therapy service, as well as the neurologist who requested nerve and muscle biopsy. The patient was fully explained about the risks of the surgery and the procedure may not improve her condition, and probably will never know what is wrong with her, infection, and hematoma.  DESCRIPTION OF PROCEDURE:  The patient was taken to the operating room. IV sedation was started. The left leg was prepped with Betadine. Incision of the skin was made in the lateral aspect of the left side was made. Incision was carried down through the fascia. We found the vastus muscle which was identified by videography. Incision proximal and distal with removal of a good piece of muscle was done. The muscle was stretched over the tongue blade. Having good muscle biopsy, the area was irrigated. Hemostasis was achieved with bipolar and the wound was closed with Vicryl and Steri-Strips.  Then in the left foot, at the level of the lateral aspect of the mid ankle, the incision was carried out down through skin, fascia, and we were able to identify the sural nerve. Having done this, silk was  attached proximal and distal and the nerve was pulled, getting a good portion of the nerve. The nerve was found to be hypertrophic. It was light pale. Having achieved good hemostasis and having a good biopsy, the area was irrigated and hemostasis was done with bipolar and the wound was closed with Vicryl and Steri-Strips. The patient did well. DD:  03/17/00 TD:  03/18/00 Job: [id number]

## 2011-01-03 NOTE — Discharge Summary (Signed)
[place]. [facility]  Patient:    [name]                          MRN: [id number] Adm. Date:  [id number] Disc. Date: [id number] Attending:  Danella Penton Dictator:   Adrian Prows CC:         Outpatient clinic  [facility]  Alcide Clever. Eulah Pont, M.D.  Candy Sledge, M.D.  D. Karle Plumber, M.D.   Discharge Summary  DATE OF BIRTH:  [birth year].  DISCHARGE DIAGNOSES:  1. Weight loss.  2. Foot drop.  3. Nonhealing leg ulcer.  4. Hypereosinophilia.  5. Diabetes mellitus type 2, diet controlled.  6. Asthma.  7. Glaucoma.  8. Congestive heart failure.  9. History of Graves, now euthyroid with thyroid goiter. 10. Hypertension. 11. Left inner thigh lipoma. 12. Status post total abdominal hysterectomy and bilateral     salpingo-oophorectomy.  FOLLOW-UP:  Biopsies of the left sural nerve and overlying nerve to be scheduled the week of Sunday, March 15, 2000.  Please discontinue ________ acid if the patient is still itching after spironolactone was discontinued for that reason before discharge.  PROCEDURE: 1. March 02, 2000, right lower extremity x-ray negative for osteomyelitis. 2. March 02, 2000, abdominal CT revealed liver cyst and nothing else. 3. March 02, 2000, chest CT revealed evidence of a thyroid goiter, also    left lower lobe of the lung atelectasis versus scarring; much improved    from prior films. 4. March 02, 2000, chest x-ray revealed mediastinal mass, small right basal    pneumonia versus atelectasis. 5. March 05, 2000, echocardiogram demonstrating left ventricular ejection    fraction of 20%, also septal and anterior akinesis, otherwise diffuse    hypokinesis. 6. March 05, 2000, ulcer site punch biopsy showing no evidence of Churg-Strauss    as suspected, otherwise nonspecific. 7. March 12, 2000, MRI of the lumbar spine showing several insignificant    disc herniations but without evidence of spinal  stenosis.  CONSULTANTS: 1. Candy Sledge, M.D., neurology. 2. Norton Blizzard, M.D., cardiovascular and thoracic surgery. 3. Tanya Nones. Jeral Fruit, M.D., neurosurgery. 4. Noreene Larsson, M.D., pathology. 5. Oley Balm. Sung Amabile, M.D., pulmonology.  ADMITTING HISTORY AND PHYSICAL:  This is a 77 year old African-American woman who presented to the clinic with a nonhealing ulcer on the medial side of her right ankle x about two months with continuous serous drainage status post treatment with Keflex which illuminated a purulent drainage but did not cause the wound to heal. The patient also complains of a left foot weakness on dorsiflexion and thirdly of a 100 pound weight loss over the previous approximately six months which was documented and unattended.  PHYSICAL EXAMINATION:  GENERAL APPEARANCE:  The patient was in mild distress.  VITAL SIGNS:  Weight 152 pounds, temperature 97.9, blood pressure 136/86, pulse 88, respiratory rate 14, saturations 100% on room air.  HEENT:  The patients extraocular movements were intact.  Pupils are equal, round and reactive to light.  NECK:  There was no JVD, no carotid bruit.  There was positive thyromegaly.  LUNGS:  Clear to auscultation bilaterally.  CARDIOVASCULAR:  S1 and S2, regular rate and rhythm without murmurs, rubs, or gallops.  ABDOMEN:  Her abdomen was soft, nontender and nondistended with positive bowel sounds.  BACK:  There was no CVA tenderness.  EXTREMITIES:  Skin was dystrophic with thickened spots on bilateral lower extremities.  Right medial  malleolus ulceration about 1 cm in diameter with sharp borders, clear drainage and no smell, which was pink and appeared healthy without evidence of necrosis.  NEUROLOGICAL:  Cranial nerves II-XII intact.  Motor tone was 5/5 upper extremities and 4/5 in the left lower extremity.DD:  03/15/00 TD:  03/17/00 Job: [id number]

## 2011-03-18 ENCOUNTER — Ambulatory Visit (INDEPENDENT_AMBULATORY_CARE_PROVIDER_SITE_OTHER): Payer: PRIVATE HEALTH INSURANCE | Admitting: Internal Medicine

## 2011-03-18 ENCOUNTER — Ambulatory Visit (HOSPITAL_COMMUNITY)
Admission: RE | Admit: 2011-03-18 | Discharge: 2011-03-18 | Disposition: A | Discharge status: Home / Self Care | Payer: PRIVATE HEALTH INSURANCE | Source: Ambulatory Visit | Attending: Internal Medicine | Admitting: Internal Medicine

## 2011-03-18 VITALS — BP 174/88 | HR 79 | Temp 97.3°F | Ht 63.0 in | Wt 176.3 lb

## 2011-03-18 DIAGNOSIS — I499 Cardiac arrhythmia, unspecified: Secondary | ICD-10-CM

## 2011-03-18 DIAGNOSIS — E7800 Pure hypercholesterolemia, unspecified: Secondary | ICD-10-CM

## 2011-03-18 DIAGNOSIS — I4891 Unspecified atrial fibrillation: Secondary | ICD-10-CM | POA: Insufficient documentation

## 2011-03-18 DIAGNOSIS — R9431 Abnormal electrocardiogram [ECG] [EKG]: Secondary | ICD-10-CM | POA: Insufficient documentation

## 2011-03-18 DIAGNOSIS — E0500 Thyrotoxicosis with diffuse goiter without thyrotoxic crisis or storm: Secondary | ICD-10-CM

## 2011-03-18 DIAGNOSIS — I10 Essential (primary) hypertension: Secondary | ICD-10-CM

## 2011-03-18 DIAGNOSIS — E119 Type 2 diabetes mellitus without complications: Secondary | ICD-10-CM

## 2011-03-18 LAB — POCT GLYCOSYLATED HEMOGLOBIN (HGB A1C): Hemoglobin A1C: 7.2

## 2011-03-18 MED ORDER — LISINOPRIL-HYDROCHLOROTHIAZIDE 20-25 MG PO TABS: 1.0000 | ORAL_TABLET | Freq: Every day | ORAL | Status: DC

## 2011-03-18 NOTE — Assessment & Plan Note (Signed)
Patient was advised him to lose weight. No new changes to the medications today.

## 2011-03-18 NOTE — Assessment & Plan Note (Signed)
Check TSH today

## 2011-03-18 NOTE — Assessment & Plan Note (Signed)
Her lead EKG was reviewed and patient does not have atrial fibrillation. The left axis deviation and atrial premature complexes are seen which were seen in the previous EKG from 2009.

## 2011-03-18 NOTE — Progress Notes (Signed)
  Subjective:    Patient ID: [name], female    DOB: 1926/03/22, 77 y.o.   MRN: [id number]  HPI  [name] is 77 year old woman with past medical history as noted in the chart. Patient comes in today for a regular followup visit. She does not have any specific complaints.  Foot exam was done today which shows decrease in peripheral pulses but no decrease in any sensations.  HbA1c 7.2 today on thousand milligrams twice daily of metformin. Eye exam was done last week and patient is already on ACE inhibitor.   Patient's blood pressure is 174/88 today. She did not take her morning doses of blood pressure medication.  Review of Systems  Constitutional: Negative for fever, activity change and appetite change.  HENT: Negative for sore throat.   Respiratory: Negative for cough and shortness of breath.   Cardiovascular: Negative for chest pain and leg swelling.  Gastrointestinal: Negative for nausea, abdominal pain, diarrhea, constipation and abdominal distention.  Genitourinary: Negative for frequency, hematuria and difficulty urinating.  Neurological: Negative for dizziness and headaches.  Psychiatric/Behavioral: Negative for suicidal ideas and behavioral problems.       Objective:   Physical Exam  Constitutional: She is oriented to person, place, and time. She appears well-developed and well-nourished.  HENT:  Head: Normocephalic and atraumatic.  Eyes: Conjunctivae and EOM are normal. Pupils are equal, round, and reactive to light. No scleral icterus.  Neck: Normal range of motion. Neck supple. No JVD present. No thyromegaly present.  Cardiovascular: Normal heart sounds and intact distal pulses.  Exam reveals no gallop and no friction rub.   No murmur heard.      Patient had a regular rhythm on auscultation.  Pulmonary/Chest: Effort normal and breath sounds normal. No respiratory distress. She has no wheezes. She has no rales.  Abdominal: Soft. Bowel sounds are normal. She exhibits  no distension and no mass. There is no tenderness. There is no rebound and no guarding.  Musculoskeletal: Normal range of motion. She exhibits edema. She exhibits no tenderness.       Trace pedal edema bilaterally up to the knees.  Lymphadenopathy:    She has no cervical adenopathy.  Neurological: She is alert and oriented to person, place, and time.  Psychiatric: She has a normal mood and affect. Her behavior is normal.          Assessment & Plan:   GRAVE'S DISEASE Check TSH today.

## 2011-03-18 NOTE — Assessment & Plan Note (Signed)
Patient is currently taking Pravachol 40 continue on current medications.

## 2011-03-18 NOTE — Assessment & Plan Note (Signed)
The patient did not take her blood pressure medication this morning. Last office visit patient's blood pressure was well controlled at 135/78. I will continue to monitor this at this time and not change her medications today.

## 2011-03-18 NOTE — Patient Instructions (Signed)
Sodium-Controlled Diet Sodium is a mineral. It is found in many foods. Sodium may be found naturally or added during the making of a food. The most common form of sodium is salt, which is made up of sodium and chloride. Reducing your sodium intake involves changing your eating habits. The following guidelines will help you reduce the sodium in your diet:  Stop using the salt shaker.   Use salt sparingly in cooking and baking.   Substitute with sodium-free seasonings and spices.   Do not use a salt substitute (potassium chloride) without your caregiver's permission.   Include a variety of fresh, unprocessed foods in your diet.   Limit the use of processed and convenience foods that are high in sodium.  USE THE FOLLOWING FOODS SPARINGLY: Breads/Starches  Commercial bread stuffing, commercial pancake or waffle mixes, coating mixes. Waffles. Croutons. Prepared (boxed or frozen) potato, rice, or noodle mixes that contain salt or sodium. Salted French fries or hash browns. Salted popcorn, breads, crackers, chips, or snack foods.  Vegetables  Vegetables canned with salt or prepared in cream, butter, or cheese sauces. Sauerkraut. Tomato or vegetable juices canned with salt.   Fresh vegetables are allowed if rinsed thoroughly.  Fruit  Fruit is okay to eat.  Meat and Meat Substitutes  Salted or smoked meats, such as bacon or Canadian bacon, chipped or corned beef, hot dogs, salt pork, luncheon meats, pastrami, ham, or sausage. Canned or smoked fish, poultry, or meat. Processed cheese or cheese spreads, blue or Roquefort cheese. Battered or frozen fish products. Prepared spaghetti sauce. Baked beans. Reuben sandwiches. Salted nuts. Caviar.  Milk  Limit buttermilk to 1 cup per week.  Soups and Combination Foods  Bouillon cubes, canned or dried soups, broth, consomm. Convenience (frozen or packaged) dinners with more than 600 milligrams (mg) sodium. Pot pies, pizza, Asian food, fast food  cheeseburgers, and specialty sandwiches.  Desserts and Sweets  Regular (salted) desserts, pie, commercial fruit snack pies, commercial snack cakes, canned puddings.   Eat desserts and sweets in moderation.  Fats and Oils  Gravy mixes or canned gravy. No more than 1 to 2 tablespoons of salad dressing. Chip dips.   Eat fats and oils in moderation.  Beverages  See those listed under the vegetables and milk groups.  Condiments/Miscellaneous  Ketchup, mustard, meat sauces, salsa, regular (salted) and lite soy sauce or mustard. Dill pickles, olives, meat tenderizer. Prepared horseradish or pickle relish. Dutch-processed cocoa. Baking powder or baking soda used medicinally. Worcestershire sauce. "Light" salt. Salt substitute, unless approved by your caregiver.  Document Released: 01/24/2002 Document Re-Released: 10/29/2009 [employer] Patient Information 2011 [employer]. 

## 2011-03-20 ENCOUNTER — Other Ambulatory Visit: Payer: Self-pay | Admitting: Internal Medicine

## 2011-03-24 ENCOUNTER — Other Ambulatory Visit: Payer: PRIVATE HEALTH INSURANCE

## 2011-03-24 LAB — TSH: TSH: 1.039 u[IU]/mL (ref 0.350–4.500)

## 2011-05-21 LAB — GLUCOSE, CAPILLARY: Glucose-Capillary: 133 — ABNORMAL HIGH

## 2011-06-16 ENCOUNTER — Other Ambulatory Visit: Payer: Self-pay | Admitting: Internal Medicine

## 2011-06-19 NOTE — Telephone Encounter (Signed)
Refill called to Pharmacy. Angelina Ok, RN 06/19/2011 2:34 PM.

## 2011-06-23 ENCOUNTER — Other Ambulatory Visit: Payer: Self-pay | Admitting: Internal Medicine

## 2011-07-16 ENCOUNTER — Ambulatory Visit (INDEPENDENT_AMBULATORY_CARE_PROVIDER_SITE_OTHER): Payer: PRIVATE HEALTH INSURANCE | Admitting: Internal Medicine

## 2011-07-16 ENCOUNTER — Encounter: Payer: Self-pay | Admitting: Internal Medicine

## 2011-07-16 VITALS — BP 153/91 | HR 92 | Temp 99.1°F

## 2011-07-16 DIAGNOSIS — E7800 Pure hypercholesterolemia, unspecified: Secondary | ICD-10-CM

## 2011-07-16 DIAGNOSIS — M899 Disorder of bone, unspecified: Secondary | ICD-10-CM

## 2011-07-16 DIAGNOSIS — M949 Disorder of cartilage, unspecified: Secondary | ICD-10-CM

## 2011-07-16 DIAGNOSIS — Z299 Encounter for prophylactic measures, unspecified: Secondary | ICD-10-CM

## 2011-07-16 DIAGNOSIS — E119 Type 2 diabetes mellitus without complications: Secondary | ICD-10-CM

## 2011-07-16 DIAGNOSIS — Z23 Encounter for immunization: Secondary | ICD-10-CM

## 2011-07-16 DIAGNOSIS — I10 Essential (primary) hypertension: Secondary | ICD-10-CM

## 2011-07-16 DIAGNOSIS — D649 Anemia, unspecified: Secondary | ICD-10-CM

## 2011-07-16 LAB — CBC WITH DIFFERENTIAL/PLATELET
Eosinophils Absolute: 0.3 10*3/uL (ref 0.0–0.7)
Eosinophils Relative: 7 % — ABNORMAL HIGH (ref 0–5)
Hemoglobin: 12.8 g/dL (ref 12.0–15.0)
Lymphs Abs: 1.6 10*3/uL (ref 0.7–4.0)
MCH: 30.5 pg (ref 26.0–34.0)
MCV: 93.3 fL (ref 78.0–100.0)
Monocytes Relative: 8 % (ref 3–12)
Neutrophils Relative %: 49 % (ref 43–77)
RBC: 4.2 MIL/uL (ref 3.87–5.11)

## 2011-07-16 LAB — COMPLETE METABOLIC PANEL WITH GFR
BUN: 15 mg/dL (ref 6–23)
CO2: 27 mEq/L (ref 19–32)
Calcium: 9.8 mg/dL (ref 8.4–10.5)
Chloride: 101 mEq/L (ref 96–112)
Creat: 0.8 mg/dL (ref 0.50–1.10)
GFR, Est African American: 79 mL/min

## 2011-07-16 LAB — LIPID PANEL
Cholesterol: 175 mg/dL (ref 0–200)
Triglycerides: 87 mg/dL (ref <150)

## 2011-07-16 LAB — POCT GLYCOSYLATED HEMOGLOBIN (HGB A1C): Hemoglobin A1C: 7.6

## 2011-07-16 NOTE — Patient Instructions (Signed)
Please follow up in 6 months or as needed.

## 2011-07-17 ENCOUNTER — Other Ambulatory Visit: Payer: Self-pay | Admitting: Internal Medicine

## 2011-07-17 DIAGNOSIS — Z299 Encounter for prophylactic measures, unspecified: Secondary | ICD-10-CM | POA: Insufficient documentation

## 2011-07-17 MED ORDER — POTASSIUM CHLORIDE ER 10 MEQ PO TBCR: 10.0000 meq | EXTENDED_RELEASE_TABLET | Freq: Two times a day (BID) | ORAL | Status: DC

## 2011-07-17 NOTE — Assessment & Plan Note (Signed)
Continue calcium and vitamin D at this time.

## 2011-07-17 NOTE — Assessment & Plan Note (Signed)
Tdap given today.

## 2011-07-17 NOTE — Assessment & Plan Note (Signed)
Patient is very well controlled at this time. Continue metformin.

## 2011-07-17 NOTE — Progress Notes (Signed)
  Subjective:    Patient ID: [name], female    DOB: [birth year], 77 y.o.   MRN: [id number]  HPI  Patient is 77 years old woman with past medical history as noted in the chart. She is here today for an annual followup. Her blood pressure is slightly elevated at 153/91.  She has no complaints.  Review of Systems  Constitutional: Negative for fever, activity change and appetite change.  HENT: Negative for sore throat.   Respiratory: Negative for cough and shortness of breath.   Cardiovascular: Negative for chest pain and leg swelling.  Gastrointestinal: Negative for nausea, abdominal pain, diarrhea, constipation and abdominal distention.  Genitourinary: Negative for frequency, hematuria and difficulty urinating.  Neurological: Negative for dizziness and headaches.  Psychiatric/Behavioral: Negative for suicidal ideas and behavioral problems.       Objective:   Physical Exam  Constitutional: She is oriented to person, place, and time. She appears well-developed and well-nourished.  HENT:  Head: Normocephalic and atraumatic.  Eyes: Conjunctivae and EOM are normal. Pupils are equal, round, and reactive to light. No scleral icterus.  Neck: Normal range of motion. Neck supple. No JVD present. No thyromegaly present.  Cardiovascular: Normal rate, regular rhythm, normal heart sounds and intact distal pulses.  Exam reveals no gallop and no friction rub.   No murmur heard. Pulmonary/Chest: Effort normal and breath sounds normal. No respiratory distress. She has no wheezes. She has no rales.  Abdominal: Soft. Bowel sounds are normal. She exhibits no distension and no mass. There is no tenderness. There is no rebound and no guarding.  Musculoskeletal: Normal range of motion. She exhibits no edema and no tenderness.  Lymphadenopathy:    She has no cervical adenopathy.  Neurological: She is alert and oriented to person, place, and time.  Psychiatric: She has a normal mood and affect. Her  behavior is normal.          Assessment & Plan:

## 2011-07-17 NOTE — Assessment & Plan Note (Addendum)
Continue Pravachol at this time. I will check lipid profile and update. Will check lipid profile and hepatic enzymes.

## 2011-07-17 NOTE — Assessment & Plan Note (Addendum)
I will not change any medication at this time considering the benefit of blood pressure reduction versus risk of orthostasis. Will check metabolic profile for crt and K.

## 2011-09-21 ENCOUNTER — Other Ambulatory Visit: Payer: Self-pay | Admitting: Internal Medicine

## 2011-10-15 ENCOUNTER — Other Ambulatory Visit: Payer: Self-pay | Admitting: Internal Medicine

## 2011-11-08 ENCOUNTER — Other Ambulatory Visit: Payer: Self-pay | Admitting: Internal Medicine

## 2011-12-23 ENCOUNTER — Other Ambulatory Visit: Payer: Self-pay | Admitting: Internal Medicine

## 2012-01-16 ENCOUNTER — Emergency Department (HOSPITAL_COMMUNITY)
Admission: EM | Admit: 2012-01-16 | Discharge: 2012-01-16 | Disposition: A | Discharge status: Home / Self Care | Payer: PRIVATE HEALTH INSURANCE | Attending: Emergency Medicine | Admitting: Emergency Medicine

## 2012-01-16 ENCOUNTER — Encounter (HOSPITAL_COMMUNITY): Payer: Self-pay | Admitting: Emergency Medicine

## 2012-01-16 DIAGNOSIS — R0602 Shortness of breath: Secondary | ICD-10-CM | POA: Insufficient documentation

## 2012-01-16 DIAGNOSIS — E0500 Thyrotoxicosis with diffuse goiter without thyrotoxic crisis or storm: Secondary | ICD-10-CM | POA: Insufficient documentation

## 2012-01-16 DIAGNOSIS — J45909 Unspecified asthma, uncomplicated: Secondary | ICD-10-CM

## 2012-01-16 DIAGNOSIS — M19019 Primary osteoarthritis, unspecified shoulder: Secondary | ICD-10-CM | POA: Insufficient documentation

## 2012-01-16 DIAGNOSIS — R05 Cough: Secondary | ICD-10-CM | POA: Insufficient documentation

## 2012-01-16 DIAGNOSIS — I10 Essential (primary) hypertension: Secondary | ICD-10-CM | POA: Insufficient documentation

## 2012-01-16 DIAGNOSIS — M3130 Wegener's granulomatosis without renal involvement: Secondary | ICD-10-CM | POA: Insufficient documentation

## 2012-01-16 DIAGNOSIS — R059 Cough, unspecified: Secondary | ICD-10-CM | POA: Insufficient documentation

## 2012-01-16 DIAGNOSIS — E7800 Pure hypercholesterolemia, unspecified: Secondary | ICD-10-CM | POA: Insufficient documentation

## 2012-01-16 DIAGNOSIS — E119 Type 2 diabetes mellitus without complications: Secondary | ICD-10-CM | POA: Insufficient documentation

## 2012-01-16 MED ORDER — PREDNISONE 20 MG PO TABS
60.0000 mg | ORAL_TABLET | Freq: Once | ORAL | Status: AC
  Administered 2012-01-16: 60 mg via ORAL
  Filled 2012-01-16: qty 3

## 2012-01-16 MED ORDER — ALBUTEROL SULFATE HFA 108 (90 BASE) MCG/ACT IN AERS
2.0000 | INHALATION_SPRAY | RESPIRATORY_TRACT | Status: DC
  Administered 2012-01-16: 2 via RESPIRATORY_TRACT
  Filled 2012-01-16: qty 6.7

## 2012-01-16 MED ORDER — ALBUTEROL SULFATE (5 MG/ML) 0.5% IN NEBU
5.0000 mg | INHALATION_SOLUTION | Freq: Once | RESPIRATORY_TRACT | Status: AC
  Administered 2012-01-16: 5 mg via RESPIRATORY_TRACT
  Filled 2012-01-16: qty 1

## 2012-01-16 MED ORDER — IPRATROPIUM BROMIDE 0.02 % IN SOLN
0.5000 mg | Freq: Once | RESPIRATORY_TRACT | Status: AC
  Administered 2012-01-16: 0.5 mg via RESPIRATORY_TRACT
  Filled 2012-01-16: qty 2.5

## 2012-01-16 MED ORDER — PREDNISONE 20 MG PO TABS: 40.0000 mg | ORAL_TABLET | Freq: Every day | ORAL | Status: DC

## 2012-01-16 NOTE — ED Provider Notes (Signed)
History     CSN: [id number]  Arrival date & time 01/16/12  1306   First MD Initiated Contact with Patient 01/16/12 1318      Chief Complaint  Patient presents with  . Shortness of Breath    (Consider location/radiation/quality/duration/timing/severity/associated sxs/prior treatment) The history is provided by the patient.   patient's had shortness of breath since yesterday. Patient does not feel like passing out. The severity of the difficulty breathing is mild to moderate. She's a history of asthma. She states this feels like a typical asthma exacerbation. She states she is out of her albuterol inhaler. She still has her Advair. She states that he stopped both of her inhalers because she's doing so well. No Chest pain. She's had a cough with clear sputum it is chronic for her. No fevers. No peripheral swelling. No abdominal pain.  Past Medical History  Diagnosis Date  . Hypertension   . Diabetes mellitus   . Asthma   . Osteoarthritis     left shoulder  . Pseudogout   . Hypokalemia     2/2 HCTZ on chronic K oral supp  . 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 Grave's disease     s/p thyroidectomy  . Glaucoma   . Hypercholesteremia   . Venous insufficiency   . Cardiomyopathy     Dilated CM, EF 50-55% 2003  . Osteopenia     DEXA 2004  . Lipoma     left inner thigh  . Cataract     left eye  . VASCULITIS 07/27/2006    Past Surgical History  Procedure Date  . Abdominal hysterectomy   . Salpingoophorectomy   . Thyroidectomy     No family history on file.  History  Substance Use Topics  . Smoking status: Never Smoker   . Smokeless tobacco: Not on file  . Alcohol Use: No    OB History    Grav Para Term Preterm Abortions TAB SAB Ect Mult Living                  Review of Systems  Constitutional: Negative for activity change and appetite change.  HENT: Negative for neck stiffness.   Eyes: Negative for pain.  Respiratory: Positive for  cough and shortness of breath. Negative for chest tightness.   Cardiovascular: Negative for chest pain and leg swelling.  Gastrointestinal: Negative for nausea, vomiting, abdominal pain and diarrhea.  Genitourinary: Negative for flank pain.  Musculoskeletal: Negative for back pain.  Skin: Negative for rash.  Neurological: Negative for weakness, numbness and headaches.  Psychiatric/Behavioral: Negative for behavioral problems.    Allergies  Review of patient's allergies indicates no known allergies.  Home Medications   Current Outpatient Rx  Name Route Sig Dispense Refill  . ASPIRIN 81 MG PO TABS Oral Take 81 mg by mouth daily.      . ATROPINE SULFATE 1 % OP SOLN Left Eye Place 1 drop into the left eye as directed.     Marland Kitchen BRIMONIDINE TARTRATE 0.1 % OP SOLN Left Eye Place 1 drop into the left eye every 12 (twelve) hours.     Marland Kitchen CALCIUM CARBONATE-VITAMIN D 500-200 MG-UNIT PO TABS Oral Take 1 tablet by mouth daily. With meals    . DORZOLAMIDE HCL 2 % OP SOLN Left Eye Place 1 drop into the left eye as directed.     Marland Kitchen FLUTICASONE-SALMETEROL 100-50 MCG/DOSE IN AEPB Inhalation Inhale 1 puff into the lungs 2 (two)  times daily.     Marland Kitchen LISINOPRIL-HYDROCHLOROTHIAZIDE 20-25 MG PO TABS Oral Take 1 tablet by mouth daily. 30 tablet 11  . METFORMIN HCL 1000 MG PO TABS Oral Take 1,000 mg by mouth daily with breakfast.    . PRAVASTATIN SODIUM 40 MG PO TABS Oral Take 40 mg by mouth daily.    Marland Kitchen PREDNISOLONE ACETATE 1 % OP SUSP Left Eye Place 1 drop into the left eye every other day.     Marland Kitchen PREDNISONE 20 MG PO TABS Oral Take 2 tablets (40 mg total) by mouth daily. 4 tablet 0    BP 147/87  Pulse 108  Temp 98.5 F (36.9 C)  Resp 17  SpO2 94%  Physical Exam  Nursing note and vitals reviewed. Constitutional: She is oriented to person, place, and time. She appears well-developed and well-nourished.  HENT:  Head: Normocephalic and atraumatic.  Eyes: EOM are normal. Pupils are equal, round, and reactive to  light.  Neck: Normal range of motion. Neck supple.  Cardiovascular: Normal rate, regular rhythm and normal heart sounds.   No murmur heard. Pulmonary/Chest: Effort normal. No respiratory distress. She has wheezes. She has no rales.       Mild diffuse wheezes without rales. No respiratory distress.  Abdominal: Soft. Bowel sounds are normal. She exhibits no distension. There is no tenderness. There is no rebound and no guarding.  Musculoskeletal: Normal range of motion.  Neurological: She is alert and oriented to person, place, and time. No cranial nerve deficit.  Skin: Skin is warm and dry.  Psychiatric: She has a normal mood and affect. Her speech is normal.    ED Course  Procedures (including critical care time)  Labs Reviewed - No data to display No results found.   1. Asthma      Date: 01/16/2012  Rate: 107  Rhythm: sinus tachycardia and premature ventricular contractions (PVC)  QRS Axis: left  Intervals: normal  ST/T Wave abnormalities: nonspecific ST/T changes  Conduction Disutrbances:none  Narrative Interpretation:   Old EKG Reviewed: none available and changes noted    MDM   patient was shortness of breath and history of asthma. She feels much better after albuterol treatment and steroids. She'll be discharged home with a short course of steroids and an inhaler.         Juliet Rude. Rubin Payor, MD 01/16/12 1424

## 2012-01-16 NOTE — ED Notes (Signed)
Onset one day ago states history of asthma and had shortness of breath with wheezing took inhaler one day ago with relief and today no relief.  Denies any pain or chest pain.

## 2012-01-21 ENCOUNTER — Ambulatory Visit (INDEPENDENT_AMBULATORY_CARE_PROVIDER_SITE_OTHER): Payer: PRIVATE HEALTH INSURANCE | Admitting: Internal Medicine

## 2012-01-21 ENCOUNTER — Encounter: Payer: Self-pay | Admitting: Internal Medicine

## 2012-01-21 ENCOUNTER — Ambulatory Visit (HOSPITAL_COMMUNITY)
Admission: RE | Admit: 2012-01-21 | Discharge: 2012-01-21 | Disposition: A | Discharge status: Home / Self Care | Payer: PRIVATE HEALTH INSURANCE | Source: Ambulatory Visit | Attending: Internal Medicine | Admitting: Internal Medicine

## 2012-01-21 VITALS — BP 146/86 | HR 118 | Temp 99.4°F | Wt 182.2 lb

## 2012-01-21 DIAGNOSIS — I420 Dilated cardiomyopathy: Secondary | ICD-10-CM

## 2012-01-21 DIAGNOSIS — I499 Cardiac arrhythmia, unspecified: Secondary | ICD-10-CM

## 2012-01-21 DIAGNOSIS — I428 Other cardiomyopathies: Secondary | ICD-10-CM

## 2012-01-21 DIAGNOSIS — Z79899 Other long term (current) drug therapy: Secondary | ICD-10-CM

## 2012-01-21 DIAGNOSIS — E119 Type 2 diabetes mellitus without complications: Secondary | ICD-10-CM

## 2012-01-21 DIAGNOSIS — I10 Essential (primary) hypertension: Secondary | ICD-10-CM

## 2012-01-21 LAB — COMPLETE METABOLIC PANEL WITH GFR
ALT: 7 U/L (ref 0–35)
AST: 13 U/L (ref 0–37)
Alkaline Phosphatase: 58 U/L (ref 39–117)
CO2: 30 mEq/L (ref 19–32)
Sodium: 137 mEq/L (ref 135–145)
Total Bilirubin: 0.8 mg/dL (ref 0.3–1.2)
Total Protein: 7.3 g/dL (ref 6.0–8.3)

## 2012-01-21 LAB — POCT GLYCOSYLATED HEMOGLOBIN (HGB A1C): Hemoglobin A1C: 7.6

## 2012-01-21 LAB — PRO B NATRIURETIC PEPTIDE: Pro B Natriuretic peptide (BNP): 1879 pg/mL — ABNORMAL HIGH (ref <451)

## 2012-01-21 LAB — GLUCOSE, CAPILLARY: Glucose-Capillary: 183 mg/dL — ABNORMAL HIGH (ref 70–99)

## 2012-01-21 MED ORDER — FUROSEMIDE 20 MG PO TABS: 20.0000 mg | ORAL_TABLET | Freq: Every day | ORAL | Status: DC

## 2012-01-21 MED ORDER — METOPROLOL SUCCINATE ER 25 MG PO TB24: 25.0000 mg | ORAL_TABLET | Freq: Every day | ORAL | Status: DC

## 2012-01-21 NOTE — Progress Notes (Signed)
  Subjective:    Patient ID: [name], female    DOB: June 14, 1926, 78 y.o.   MRN: [id number]  HPI [name] is a 78 year old female with PMH of multiple medical problems most significant for DM, HRN and dilated cardiomyopathy.   She comes in today with chief complaint of dizziness. Comes in spells. No exacerbating factors. More likely when she is washing the dishes or sweeping the floor(prolonged standing). She has had h/o irregular heart rhythm but she denies palpitations when she has these spells. No falls but she had broken her hip 1 year ago from one of these falls. She uses a cane mostly and uses walker when she is coming down the steps. She gets short of breath/weak with few steps inside the home.  She complains of constipation and has been using prune juice to get her bowels moving.  Patient feels that she is about the same in terms of how she feels as compared to what she was feeling last week.   Review of Systems  Constitutional: Negative for fever, activity change and appetite change.  HENT: Negative for sore throat.   Respiratory: Positive for shortness of breath. Negative for cough.   Cardiovascular: Positive for leg swelling. Negative for chest pain.  Gastrointestinal: Positive for constipation. Negative for nausea, abdominal pain, diarrhea and abdominal distention.  Genitourinary: Negative for frequency, hematuria and difficulty urinating.  Neurological: Positive for dizziness. Negative for headaches.  Psychiatric/Behavioral: Negative for suicidal ideas and behavioral problems.       Objective:   Physical Exam  Constitutional: She is oriented to person, place, and time. She appears well-developed and well-nourished.  HENT:  Head: Normocephalic and atraumatic.  Eyes: Conjunctivae and EOM are normal. Pupils are equal, round, and reactive to light. No scleral icterus.  Neck: Normal range of motion. Neck supple. No JVD present. No thyromegaly present.  Cardiovascular:  Normal rate, regular rhythm, normal heart sounds and intact distal pulses.  Exam reveals no gallop and no friction rub.   No murmur heard. Pulmonary/Chest: Effort normal and breath sounds normal. No respiratory distress. She has no wheezes. She has no rales.  Abdominal: Soft. Bowel sounds are normal. She exhibits no distension and no mass. There is no tenderness. There is no rebound and no guarding.  Musculoskeletal: Normal range of motion. She exhibits no edema and no tenderness.  Lymphadenopathy:    She has no cervical adenopathy.  Neurological: She is alert and oriented to person, place, and time.  Psychiatric: She has a normal mood and affect. Her behavior is normal.          Assessment & Plan:

## 2012-01-21 NOTE — Patient Instructions (Signed)
Cardiac Arrhythmia Your heart is a muscle that works to pump blood through your body by regular contractions. The beating of your heart is controlled by a system of special pacemaker cells. These cells control the electrical activity of the heart. When the system controlling this regular beating is disturbed, a heart rhythm abnormality (arrhythmia) results. WHEN YOUR HEART SKIPS A BEAT One of the most common and least serious heart arrhythmias is called an ectopic or premature atrial heartbeat (PAC). This may be noticed as a small change in your regular pulse. A PAC originates from the top part (atrium) of the heart. Within the right atrium, the SA node is the area that normally controls the regularity of the heart. PACs occur in heart tissue outside of the SA node region. You may feel this as a skipped beat or heart flutter, especially if several occur in succession or occur frequently.  Another arrhythmia is ventricular premature complex (VCP or PVC). These extra beats start out in the bottom, more muscular chambers of the heart. In most cases a PVC is harmless. If there are underlying causes that are making the heart irritable such as an overactive thyroid or a prior heart attack PVCs may be of more concern. In a few cases, medications to control the heart rhythm may be prescribed. Things to try at home:  Cut down or avoid alcohol, tobacco and caffeine.   Get enough sleep.   Reduce stress.   Exercise more.  WHEN THE HEART BEATS TOO FAST Atrial tachycardia is a fast heart rate, which starts out in the atrium. It may last from minutes to much longer. Your heart may beat 140 to 240 times per minute instead of the normal 60 to 100.  Symptoms include a worried feeling (anxiety) and a sense that your heart is beating fast and hard.   You may be able to stop the fast rate by holding your breath or bearing down as if you were going to have a bowel movement.   This type of fast rate is usually not  dangerous.  Atrial fibrillation and atrial flutter are other fast rhythms that start in the atria. Both conditions keep the atria from filling with enough blood so the heart does not work well.  Symptoms include feeling light-headed or faint.   These fast rates may be the result of heart damage or disease. Too much thyroid hormone may play a role.   There may be no clear cause or it may be from heart disease or damage.   Medication or a special electrical treatment (cardioversion) may be needed to get the heart beating normally.  Ventricular tachycardia is a fast heart rate that starts in the lower muscular chambers (ventricles) This is a serious disorder that requires treatment as soon as possible. You need someone else to get and use a small defibrillator.  Symptoms include collapse, chest pain, or being short of breath.   Treatment may include medication, procedures to improve blood flow to the heart, or an implantable cardiac defibrillator (ICD).  DIAGNOSIS   A cardiogram (EKG or ECG) will be done to see the arrhythmia, as well as lab tests to check the underlying cause.   If the extra beats or fast rate come and go, you may wear a Holter monitor that records your heart rate for a longer period of time.  SEEK MEDICAL CARE IF:  You have irregular or fast heartbeats (palpitations).   You experience skipped beats.   You develop lightheadedness.     You have chest discomfort.   You have shortness of breath.   You have more frequent episodes, if you are already being treated.  SEEK IMMEDIATE MEDICAL CARE IF:   You have severe chest pain, especially if the pain is crushing or pressure-like and spreads to the arms, back, neck, or jaw, or if you have sweating, feeling sick to your stomach (nausea), or shortness of breath. THIS IS AN EMERGENCY. Do not wait to see if the pain will go away. Get medical help at once. Call 911 or 0 (operator). DO NOT drive yourself to the hospital.   You  feel dizzy or faint.   You have episodes of previously documented atrial tachycardia that do not resolve with the techniques your caregiver has taught you.   Irregular or rapid heartbeats begin to occur more often than in the past, especially if they are associated with more pronounced symptoms or of longer duration.  Document Released: 08/04/2005 Document Revised: 07/24/2011 Document Reviewed: 03/22/2008 [employer] Patient Information 2012 [employer]. 

## 2012-01-22 NOTE — Assessment & Plan Note (Signed)
Patient states that she has always had this irregular rhythm and and it does not bother her. I obtained 12-lead EKG to make sure the patient does not atrial fibrillation. With the new onset of weakness and 12-lead EKG suggestive of multifocal atrial tachycardia with a heart rate of about 120, I believe that patient would benefit from metoprolol xr. I would follow up the patient in 3-4 days after starting the medication.

## 2012-01-22 NOTE — Assessment & Plan Note (Addendum)
Although patient did not complain of any acute events. I discussed the case with Dr. Aundria Rud who agrees and feels that patient has been gradually worsening and likely on the verge of some collapse. Patient lives by herself although her daughters and son live nearby.  Patient has gained about 6 pounds as compared to July 2012. She is currently not taking any diuretics. I feel that patient would benefit from a diuretic at this time. I will start her on Lasix 20 mg daily. I would also obtain proBNP and kidney function test to get an idea of her electrolytes and creatinine.  Patient would need close followup and hence her son was called into the room and the situation was explained to him. His son agreed to bring her next week.

## 2012-01-22 NOTE — Assessment & Plan Note (Signed)
Patient is slightly hypertensive at this time. I will start her on metoprolol today along with Lasix. Also metoprolol was started for multifocal atrial tachycardia and Lasix for volume overload, these medications will help get the blood pressure down. Kidney function tests performed today to check creatinine and potassium.

## 2012-01-22 NOTE — Assessment & Plan Note (Signed)
HbA1c is fairly controlled at this time. I believe that patient's general health has deteriorated over the last few months. The goal for HbA1c should be liberal given her age. I would shoot for an HbA1c goal of less than 8 instead of less than 7.

## 2012-01-27 ENCOUNTER — Encounter: Payer: Self-pay | Admitting: Internal Medicine

## 2012-01-27 ENCOUNTER — Ambulatory Visit (INDEPENDENT_AMBULATORY_CARE_PROVIDER_SITE_OTHER): Payer: PRIVATE HEALTH INSURANCE | Admitting: Internal Medicine

## 2012-01-27 VITALS — BP 146/90 | HR 71 | Temp 98.5°F | Wt 182.2 lb

## 2012-01-27 DIAGNOSIS — I428 Other cardiomyopathies: Secondary | ICD-10-CM

## 2012-01-27 DIAGNOSIS — I10 Essential (primary) hypertension: Secondary | ICD-10-CM

## 2012-01-27 DIAGNOSIS — E7800 Pure hypercholesterolemia, unspecified: Secondary | ICD-10-CM

## 2012-01-27 DIAGNOSIS — I509 Heart failure, unspecified: Secondary | ICD-10-CM

## 2012-01-27 DIAGNOSIS — I499 Cardiac arrhythmia, unspecified: Secondary | ICD-10-CM

## 2012-01-27 LAB — COMPLETE METABOLIC PANEL WITH GFR
Albumin: 3.7 g/dL (ref 3.5–5.2)
BUN: 16 mg/dL (ref 6–23)
CO2: 32 mEq/L (ref 19–32)
Calcium: 9.5 mg/dL (ref 8.4–10.5)
Chloride: 98 mEq/L (ref 96–112)
Creat: 0.72 mg/dL (ref 0.50–1.10)
GFR, Est African American: 89 mL/min
Glucose, Bld: 167 mg/dL — ABNORMAL HIGH (ref 70–99)
Potassium: 3.1 mEq/L — ABNORMAL LOW (ref 3.5–5.3)

## 2012-01-27 LAB — PRO B NATRIURETIC PEPTIDE: Pro B Natriuretic peptide (BNP): 2209 pg/mL — ABNORMAL HIGH (ref <451)

## 2012-01-27 LAB — MAGNESIUM: Magnesium: 1.9 mg/dL (ref 1.5–2.5)

## 2012-01-27 MED ORDER — LISINOPRIL 20 MG PO TABS: 20.0000 mg | ORAL_TABLET | Freq: Every day | ORAL | Status: DC

## 2012-01-27 NOTE — Assessment & Plan Note (Signed)
The goal for blood pressure would be liberal given patient's dizziness.  I would aim for 160/90 other than 140/90. I discontinued her hydrochlorothiazide today in view of patient being on Lasix.

## 2012-01-27 NOTE — Patient Instructions (Signed)
Home Safety and Preventing Falls Falls are a leading cause of injury and while they affect all age groups, falls have greater short-term and long-term impact on older age groups. However, falls should not be a part of life or aging. It is possible for individuals and their families to use preventive measures to significantly decrease the likelihood that anyone, especially an older adult, will fall. There are many simple measures which can make your home safer with respect to preventing falls. The following actions can help reduce falls among all members of your family and are especially important as you age, when your balance, lower limb strength, coordination, and eyesight may be declining. The use of preventive measures will help to reduce you and your family's risk of falls and serious medical consequences. OUTDOORS  Repair cracks and edges of walkways and driveways.   Remove high doorway thresholds and trim shrubbery on the main path into your home.   Ensure there is good outside lighting at main entrances and along main walkways.   Clear walkways of tools, rocks, debris, and clutter.   Check that handrails are not broken and are securely fastened. Both sides of steps should have handrails.   In the garage, be attentive to and clean up grease or oil spills on the cement. This can make the surface extremely slippery.   In winter, have leaves, snow, and ice cleared regularly.   Use sand or salt on walkways during winter months.  BATHROOM  Install grab bars by the toilet and in the tub and shower.   Use non-skid mats or decals in the tub or shower.   If unable to easily stand unsupported while showering, place a plastic non slip stool in the shower to sit on when needed.   Install night lights.   Keep floors dry and clean up all water on the floor immediately.   Remove soap buildup in tub or shower on a regular basis.   Secure bath mats with non-slip, double-sided rug tape.    Remove tripping hazards from the floors.  BEDROOMS  Install night lights.   Do not use oversized bedding.   Make sure a bedside light is easy to reach.   Keep a telephone by your bedside.   Make sure that you can get in and out of your bed easily.   Have a firm chair, with side arms, to use for getting dressed.   Remove clutter from around closets.   Store clothing, bed coverings, and other household items where you can reach them comfortably.   Remove tripping hazards from the floor.  LIVING AREAS AND STAIRWAYS  Turn on lights to avoid having to walk through dark areas.   Keep lighting uniform in each room. Place brighter lightbulbs in darker areas, including stairways.   Replace lightbulbs that burn out in stairways immediately.   Arrange furniture to provide for clear pathways.   Keep furniture in the same place.   Eliminate or tape down electrical cables in high traffic areas.   Place handrails on both sides of stairways. Use handrails when going up or down stairs.   Most falls occur on the top or bottom 3 steps.   Fix any loose handrails. Make sure handrails on both sides of the stairways are as long as the stairs.   Remove all walkway obstacles.   Coil or tape electrical cords off to the side of walking areas and out of the way. If using many extension cords, have an electrician   put in a new wall outlet to reduce or eliminate them.   Make sure spills are cleaned up quickly and allow time for drying before walking on freshly cleaned floors.   Firmly attach carpet with non-skid or two-sided tape.   Keep frequently used items within easy reach.   Remove tripping hazards such as throw rugs and clutter in walkways. Never leave objects on stairs.   Get rid of throw rugs elsewhere if possible.   Eliminate uneven floor surfaces.   Make sure couches and chairs are easy to get into and out of.   Check carpeting to make sure it is firmly attached along stairs.    Make repairs to worn or loose carpet promptly.   Select a carpet pattern that does not visually hide the edge of steps.   Avoid placing throw rugs or scatter rugs at the top or bottom of stairways, or properly secure with carpet tape to prevent slippage.   Have an electrician put in a light switch at the top and bottom of the stairs.   Get light switches that glow.   Avoid the following practices: hurrying, inattention, obscured vision, carrying large loads, and wearing slip-on shoes.   Be aware of all pets.  KITCHEN  Place items that are used frequently, such as dishes and food, within easy reach.   Keep handles on pots and pans toward the center of the stove. Use back burners when possible.   Make sure spills are cleaned up quickly and allow time for drying.   Avoid walking on wet floors.   Avoid hot utensils and knives.   Position shelves so they are not too high or low.   Place commonly used objects within easy reach.   If necessary, use a sturdy step stool with a grab bar when reaching.   Make sure electrical cables are out of the way.   Do not use floor polish or wax that makes floors slippery.  OTHER HOME FALL PREVENTION STRATEGIES  Wear low heel or rubber sole shoes that are supportive and fit well.   Wear closed toe shoes.   Know and watch for side effects of medications. Have your caregiver or pharmacist look at all your medicines, even over-the-counter medicines. Some medicines can make you sleepy or dizzy.   Exercise regularly. Exercise makes you stronger and improves your balance and coordination.   Limit use of alcohol.   Use eyeglasses if necessary and keep them clean. Have your vision checked every year.   Organize your household in a manner that minimizes the need to walk distances when hurried, or go up and down stairs unnecessarily. For example, have a phone placed on at least each floor of your home. If possible, have a phone beside each sitting  or lying area where you spend the most time at home. Keep emergency numbers posted at all phones.   Use non-skid floor wax.   When using a ladder, make sure:   The base is firm.   All ladder feet are on level ground.   The ladder is angled against the wall properly.   When climbing a ladder, face the ladder and hold the ladder rungs firmly.   If reaching, always keep your hips and body weight centered between the rails.   When using a stepladder, make sure it is fully opened and both spreaders are firmly locked.   Do not climb a closed stepladder.   Avoid climbing beyond the second step from the top   of a stepladder and the 4th rung from the top of an extension ladder.   Learn and use mobility aids as needed.   Change positions slowly. Arise slowly from sitting and lying positions. Sit on the edge of your bed before getting to your feet.   If you have a history of falls, ask someone to add color or contrast paint or tape to grab bars and handrails in your home.   If you have a history of falls, ask someone to place contrasting color strips on first and last steps.   Install an electrical emergency response system if you need one, and know how to use it.   If you have a medical or other condition that causes you to have limited physical strength, it is important that you reach out to family and friends for occasional help.  FOR CHILDREN:  If young children are in the home, use safety gates. At the top of stairs use screw-mounted gates; use pressure-mounted gates for the bottom of the stairs and doorways between rooms.   Young children should be taught to descend stairs on their stomachs, feet first, and later using the handrail.   Keep drawers fully closed to prevent them from being climbed on or pulled out entirely.   Move chairs, cribs, beds and other furniture away from windows.   Consider installing window guards on windows ground floor and up, unless they are emergency  fire exits. Make sure they have easy release mechanisms.   Consider installing special locks that only allow the window to be opened to a certain height.   Never rely on window screens to prevent falls.   Never leave babies alone on changing tables, beds or sofas. Use a changing table that has a restraining strap.   When a child can pull to a standing position, the crib mattress should be adjusted to its lowest position. There should be at least 26 inches between the top rails of the crib drop side and the mattress. Toys, bumper pads, and other objects that can be used as steps to climb out should be removed from the crib.   On bunk beds never allow a child under age 6 to sleep on the top bunk. For older children, if the upper bunk is not against a wall, use guard rails on both sides. No matter how old a child is, keep the guard rails in place on the top bunk since children roll during sleep. Do not permit horseplay on bunks.   Grass and soil surfaces beneath backyard playground equipment should be replaced with hardwood chips, shredded wood mulch, sand, pea gravel, rubber, crushed stone, or another safer material at depths of at least 9 to 12 inches.   When riding bikes or using skates, skateboards, skis, or snowboards, require children to wear helmets. Look for those that have stickers stating that they meet or exceed safety standards.   Vertical posts or pickets in deck, balcony, and stairway railings should be no more than 3 1/2 inches apart if a young baby will have access to the area. The space between horizontal rails or bars, and between the floor and the first horizontal rail or bar, should be no more than 3 1/2 inches.  Document Released: 07/25/2002 Document Revised: 07/24/2011 Document Reviewed: 05/24/2009 [employer] Patient Information 2012 [employer]. 

## 2012-01-27 NOTE — Assessment & Plan Note (Signed)
Patient was started on Lasix 20 mg daily at last office visit. She was also started on potassium chloride 10 mEq twice a day. Patient said that she hadn't been taking her potassium as prescribed and instead has been eating a lot of bananas. Patient's potassium was 2.8 at last office visit and is 3.1 today. I will check magnesium today. I also discontinued hydrochlorothiazide from her medication list. Patient assured that she would start taking her potassium. I will have her come back in one to 2 weeks for a repeat basic metabolic profile. Patient's heart rate is much better today with metoprolol. Patient's blood pressure is still on the elevated side but given her age and symptoms of dizziness I would not treat it but he aggressively. The last echo on file is from 2003 and I believe that repeating an echo at this time would not change our management and hence decided against it.

## 2012-01-27 NOTE — Progress Notes (Signed)
  Subjective:    Patient ID: [name], female    DOB: 09/13/1925, 78 y.o.   MRN: [id number]  HPI Patient is here today for follow up of her last week's visit.  She was seen last week for the following:  1. Increased weakness and dizziness on and off: found to be in multifocal atrial tachycardia with HR of 120 and metoprolol started.  2. Dilated cardiomyopathy: Volume overload and started on lasix  20 mg daily. ProBNP was ~1800 last week.  3. Hypokalemia noted on BMP and oral potassium replacement started.  Patient is feeling much better today. Her HR is down to 71 per minute today. She has not had dizziness in last 6 days. Her weakness has improved. No complaints. No change in urinary habits. No change in bowel habits.      Review of Systems  Constitutional: Negative for fever, activity change and appetite change.  HENT: Negative for sore throat.   Respiratory: Negative for cough and shortness of breath.   Cardiovascular: Positive for leg swelling. Negative for chest pain.  Gastrointestinal: Negative for nausea, abdominal pain, diarrhea, constipation and abdominal distention.  Genitourinary: Negative for frequency, hematuria and difficulty urinating.  Neurological: Negative for dizziness and headaches.  Psychiatric/Behavioral: Negative for suicidal ideas and behavioral problems.       Objective:   Physical Exam  Constitutional: She is oriented to person, place, and time. She appears well-developed and well-nourished.  HENT:  Head: Normocephalic and atraumatic.  Eyes: Conjunctivae and EOM are normal. Pupils are equal, round, and reactive to light. No scleral icterus.  Neck: Normal range of motion. Neck supple. JVD present. No thyromegaly present.       6-7 cm with positive hepatojugular reflex  Cardiovascular: Normal rate, regular rhythm, normal heart sounds and intact distal pulses.  Exam reveals no gallop and no friction rub.   No murmur heard. Pulmonary/Chest: Effort  normal and breath sounds normal. No respiratory distress. She has no wheezes. She has no rales.  Abdominal: Soft. Bowel sounds are normal. She exhibits no distension and no mass. There is no tenderness. There is no rebound and no guarding.  Musculoskeletal: Normal range of motion. She exhibits edema. She exhibits no tenderness.       1+ pitting edema bilaterally left>right  Lymphadenopathy:    She has no cervical adenopathy.  Neurological: She is alert and oriented to person, place, and time.  Psychiatric: She has a normal mood and affect. Her behavior is normal.          Assessment & Plan:

## 2012-01-27 NOTE — Assessment & Plan Note (Signed)
Patient was started on metoprolol 25 mg XR. Her heart rate is much better controlled now and she feels a lot better than what she felt last week. Continue monitoring and adjust the dosage accordingly at future followup visits. No changes today.

## 2012-01-27 NOTE — Assessment & Plan Note (Signed)
Patient and her son are requesting to minimize the number of medications that she should be on. In view of this I discontinued pravastatin. Patient's last LDL was below 100.

## 2012-02-10 ENCOUNTER — Encounter: Payer: Self-pay | Admitting: Internal Medicine

## 2012-02-10 ENCOUNTER — Encounter: Payer: PRIVATE HEALTH INSURANCE | Admitting: Internal Medicine

## 2012-02-10 ENCOUNTER — Ambulatory Visit (INDEPENDENT_AMBULATORY_CARE_PROVIDER_SITE_OTHER): Payer: PRIVATE HEALTH INSURANCE | Admitting: Internal Medicine

## 2012-02-10 VITALS — BP 161/95 | HR 93 | Ht 63.0 in | Wt 180.8 lb

## 2012-02-10 DIAGNOSIS — N390 Urinary tract infection, site not specified: Secondary | ICD-10-CM

## 2012-02-10 LAB — BASIC METABOLIC PANEL WITH GFR
BUN: 7 mg/dL (ref 6–23)
Chloride: 102 mEq/L (ref 96–112)
Creat: 0.66 mg/dL (ref 0.50–1.10)
GFR, Est African American: >89 mL/min
GFR, Est Non African American: 81 mL/min
Glucose, Bld: 132 mg/dL — ABNORMAL HIGH (ref 70–99)

## 2012-02-10 MED ORDER — CIPROFLOXACIN HCL 500 MG PO TABS: 500.0000 mg | ORAL_TABLET | Freq: Two times a day (BID) | ORAL | Status: AC

## 2012-02-10 NOTE — Patient Instructions (Signed)

## 2012-02-10 NOTE — Progress Notes (Signed)
  Subjective:    Patient ID: [name], female    DOB: 1926/01/22, 78 y.o.   MRN: [id number]  HPI  Patient is here today for follow up of her last week's visit.   She was seen 2 weeks ago for the following:   1. Increased weakness and dizziness on and off: found to be in multifocal atrial tachycardia with HR of 120 and metoprolol was started. Patient's heart rate was 71 at last office visit after starting metoprolol. Patient's weakness and dizziness has completely disappeared now.  2. Dilated cardiomyopathy: Volume overload and started on lasix 20 mg daily. ProBNP was ~2200 last office visit. Patient was hypokalemic at last office visit and was told to take potassium as prescribed. I will check basic metabolic profile today.  3. Hypokalemia noted on BMP and oral potassium replacement started.   Patient is complaining of pain in her left knee which is worse than usual as she hit her leg. Swelling in her legs is much better. Patient is also complaining of lower abdominal pain associated with increased frequency of urination and burning. She denies any fever or chills at this time.  Review of Systems  Constitutional: Negative for fever, activity change and appetite change.  HENT: Negative for sore throat.   Respiratory: Negative for cough and shortness of breath.   Cardiovascular: Positive for leg swelling. Negative for chest pain.  Gastrointestinal: Positive for abdominal pain. Negative for nausea, diarrhea, constipation and abdominal distention.  Genitourinary: Positive for dysuria and frequency. Negative for hematuria and difficulty urinating.  Musculoskeletal: Positive for arthralgias and gait problem.  Neurological: Negative for dizziness and headaches.  Psychiatric/Behavioral: Negative for suicidal ideas and behavioral problems.       Objective:   Physical Exam  Constitutional: She is oriented to person, place, and time. She appears well-developed and well-nourished.  HENT:    Head: Normocephalic and atraumatic.  Eyes: Conjunctivae and EOM are normal. Pupils are equal, round, and reactive to light. No scleral icterus.  Neck: Normal range of motion. Neck supple. No JVD present. No thyromegaly present.  Cardiovascular: Normal rate, regular rhythm, normal heart sounds and intact distal pulses.  Exam reveals no gallop and no friction rub.   No murmur heard. Pulmonary/Chest: Effort normal and breath sounds normal. No respiratory distress. She has no wheezes. She has no rales.  Abdominal: Soft. Bowel sounds are normal. She exhibits no distension and no mass. There is tenderness. There is no rebound and no guarding.       Mild lower abdominal tenderness  Musculoskeletal: Normal range of motion. She exhibits edema (1+ pitting edema bilaterally right more than left). She exhibits no tenderness.  Lymphadenopathy:    She has no cervical adenopathy.  Neurological: She is alert and oriented to person, place, and time.  Psychiatric: She has a normal mood and affect. Her behavior is normal.          Assessment & Plan:

## 2012-02-12 NOTE — Assessment & Plan Note (Signed)
Urine analysis, urine culture for sensitivity. Treat with ciprofloxacin 500 mg twice a day for 3 days. Advised to drink plenty of water and fluids. Danger signs and symptoms discussed. Followup as needed.

## 2012-02-17 ENCOUNTER — Other Ambulatory Visit: Payer: Self-pay | Admitting: Internal Medicine

## 2012-02-18 ENCOUNTER — Emergency Department (HOSPITAL_COMMUNITY)
Admission: EM | Admit: 2012-02-18 | Discharge: 2012-02-18 | Disposition: A | Discharge status: Home / Self Care | Payer: PRIVATE HEALTH INSURANCE | Attending: Emergency Medicine | Admitting: Emergency Medicine

## 2012-02-18 ENCOUNTER — Encounter (HOSPITAL_COMMUNITY): Payer: Self-pay | Admitting: Physical Medicine and Rehabilitation

## 2012-02-18 ENCOUNTER — Emergency Department (HOSPITAL_COMMUNITY): Payer: PRIVATE HEALTH INSURANCE

## 2012-02-18 DIAGNOSIS — E7800 Pure hypercholesterolemia, unspecified: Secondary | ICD-10-CM | POA: Insufficient documentation

## 2012-02-18 DIAGNOSIS — M19019 Primary osteoarthritis, unspecified shoulder: Secondary | ICD-10-CM | POA: Insufficient documentation

## 2012-02-18 DIAGNOSIS — M7989 Other specified soft tissue disorders: Secondary | ICD-10-CM | POA: Insufficient documentation

## 2012-02-18 DIAGNOSIS — Z90710 Acquired absence of both cervix and uterus: Secondary | ICD-10-CM | POA: Insufficient documentation

## 2012-02-18 DIAGNOSIS — J45909 Unspecified asthma, uncomplicated: Secondary | ICD-10-CM | POA: Insufficient documentation

## 2012-02-18 DIAGNOSIS — I428 Other cardiomyopathies: Secondary | ICD-10-CM | POA: Insufficient documentation

## 2012-02-18 DIAGNOSIS — M3130 Wegener's granulomatosis without renal involvement: Secondary | ICD-10-CM | POA: Insufficient documentation

## 2012-02-18 DIAGNOSIS — M109 Gout, unspecified: Secondary | ICD-10-CM | POA: Insufficient documentation

## 2012-02-18 DIAGNOSIS — E119 Type 2 diabetes mellitus without complications: Secondary | ICD-10-CM | POA: Insufficient documentation

## 2012-02-18 DIAGNOSIS — I10 Essential (primary) hypertension: Secondary | ICD-10-CM | POA: Insufficient documentation

## 2012-02-18 DIAGNOSIS — M899 Disorder of bone, unspecified: Secondary | ICD-10-CM | POA: Insufficient documentation

## 2012-02-18 DIAGNOSIS — R609 Edema, unspecified: Secondary | ICD-10-CM

## 2012-02-18 DIAGNOSIS — Z7982 Long term (current) use of aspirin: Secondary | ICD-10-CM | POA: Insufficient documentation

## 2012-02-18 DIAGNOSIS — M949 Disorder of cartilage, unspecified: Secondary | ICD-10-CM | POA: Insufficient documentation

## 2012-02-18 LAB — BASIC METABOLIC PANEL
BUN: 17 mg/dL (ref 6–23)
Calcium: 8.9 mg/dL (ref 8.4–10.5)
Chloride: 101 mEq/L (ref 96–112)
Creatinine, Ser: 0.76 mg/dL (ref 0.50–1.10)
GFR calc Af Amer: 87 mL/min — ABNORMAL LOW (ref >90)
GFR calc non Af Amer: 75 mL/min — ABNORMAL LOW (ref >90)

## 2012-02-18 LAB — CBC WITH DIFFERENTIAL/PLATELET
Basophils Absolute: 0.1 10*3/uL (ref 0.0–0.1)
Basophils Relative: 3 % — ABNORMAL HIGH (ref 0–1)
Eosinophils Absolute: 0.2 10*3/uL (ref 0.0–0.7)
HCT: 35 % — ABNORMAL LOW (ref 36.0–46.0)
Hemoglobin: 11.6 g/dL — ABNORMAL LOW (ref 12.0–15.0)
MCH: 30.8 pg (ref 26.0–34.0)
MCHC: 33.1 g/dL (ref 30.0–36.0)
Monocytes Absolute: 0.4 10*3/uL (ref 0.1–1.0)
Monocytes Relative: 15 % — ABNORMAL HIGH (ref 3–12)
Neutro Abs: 1.1 10*3/uL — ABNORMAL LOW (ref 1.7–7.7)
RDW: 12.9 % (ref 11.5–15.5)

## 2012-02-18 LAB — TROPONIN I: Troponin I: <0.3 ng/mL (ref <0.30)

## 2012-02-18 MED ORDER — MORPHINE SULFATE 4 MG/ML IJ SOLN
4.0000 mg | Freq: Once | INTRAMUSCULAR | Status: AC
  Administered 2012-02-18: 4 mg via INTRAVENOUS
  Filled 2012-02-18: qty 1

## 2012-02-18 MED ORDER — FUROSEMIDE 10 MG/ML IJ SOLN
40.0000 mg | INTRAMUSCULAR | Status: AC
  Administered 2012-02-18: 40 mg via INTRAVENOUS
  Filled 2012-02-18: qty 4

## 2012-02-18 NOTE — ED Provider Notes (Signed)
Medical screening examination/treatment/procedure(s) were conducted as a shared visit with non-physician practitioner(s) and myself.  I personally evaluated the patient during the encounter She has chf.  Had recent decrease in diuretics by pcp.  She c/o leg pain and swelling. No doe. No cp. No cough, fever, chills, n/v.   No orthopnea or nocturia.  pe  Leg edema. Heart and lungs nl.  Not hypoxic.  cxr mild congestion. No pulm edema.  Will give diuretic and release to f/u with pcp.   Cheri Guppy, MD 02/18/12 [id number]

## 2012-02-18 NOTE — ED Notes (Signed)
Pt. C/o intermittent bilateral leg swelling. Edema present in both legs. Pedal pulses heard by doppler. Also c/o a sharp, tingling in sharp pain down the lateral side of the left foot. Not tender to touch.

## 2012-02-18 NOTE — ED Provider Notes (Signed)
History     CSN: [id number]  Arrival date & time 02/18/12  0944   First MD Initiated Contact with Patient 02/18/12 1017      Chief Complaint  Patient presents with  . Leg Swelling    (Consider location/radiation/quality/duration/timing/severity/associated sxs/prior treatment) HPI Comments: [name] 78 y.o. Female with a history significant for diabetes II and dilated cardiomyopathy presents with bilateral leg swelling that has been present for the past 4 days. The patient states that she has had this problem before but that it has normally resolved spontaneously. She states the swelling is less in the morning but worsens as the day progresses.Patient also complains of burning and tingling on the left lateral aspect of her foot. Denies, chest pain, palpitations, SOB, orthopnea.    The patient has medical history significant for:   Past Medical History:   Hypertension                                                 Diabetes mellitus                                            Asthma                                                       Osteoarthritis                                                 Comment:left shoulder   Pseudogout                                                   Hypokalemia                                                    Comment:2/2 HCTZ on chronic K oral supp   Churg-Strauss syndrome                                         Comment:Sural nerve biopsy 03/15/2000   Grave's disease                                                Comment:s/p thyroidectomy   Glaucoma  Hypercholesteremia                                           Venous insufficiency                                         Cardiomyopathy                                                 Comment:Dilated CM, EF 50-55% 2003   Osteopenia                                                     Comment:DEXA 2004   Lipoma                                                          Comment:left inner thigh   Cataract                                                       Comment:left eye   VASCULITIS                                      07/27/2006        The history is provided by the patient.    Past Medical History  Diagnosis Date  . Hypertension   . Diabetes mellitus   . Asthma   . Osteoarthritis     left shoulder  . Pseudogout   . Hypokalemia     2/2 HCTZ on chronic K oral supp  . 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 Grave's disease     s/p thyroidectomy  . Glaucoma   . Hypercholesteremia   . Venous insufficiency   . Cardiomyopathy     Dilated CM, EF 50-55% 2003  . Osteopenia     DEXA 2004  . Lipoma     left inner thigh  . Cataract     left eye  . VASCULITIS 07/27/2006    Past Surgical History  Procedure Date  . Abdominal hysterectomy   . Salpingoophorectomy   . Thyroidectomy     No family history on file.  History  Substance Use Topics  . Smoking status: Never Smoker   . Smokeless tobacco: Not on file  . Alcohol Use: No    OB History    Grav Para Term Preterm Abortions TAB SAB Ect Mult Living                  Review of Systems  Respiratory: Positive for wheezing. Negative  for shortness of breath.        Patient has mild expiratory wheezing secondary to knows asthma.  Cardiovascular: Positive for leg swelling. Negative for chest pain and palpitations.  Gastrointestinal: Negative for abdominal pain.  Skin: Positive for color change.       Hyperpigmentation due to venous stasis.    Allergies  Review of patient's allergies indicates no known allergies.  Home Medications   Current Outpatient Rx  Name Route Sig Dispense Refill  . ASPIRIN 81 MG PO TABS Oral Take 81 mg by mouth daily.      Marland Kitchen BRIMONIDINE TARTRATE 0.1 % OP SOLN Left Eye Place 1 drop into the left eye every 12 (twelve) hours.     . DORZOLAMIDE HCL 2 % OP SOLN Left Eye Place 1 drop into the left eye as directed.      Marland Kitchen FLUTICASONE-SALMETEROL 100-50 MCG/DOSE IN AEPB Inhalation Inhale 1 puff into the lungs 2 (two) times daily.     . FUROSEMIDE 20 MG PO TABS Oral Take 1 tablet (20 mg total) by mouth daily. 30 tablet 0  . LISINOPRIL 20 MG PO TABS Oral Take 1 tablet (20 mg total) by mouth daily. 30 tablet 5  . METFORMIN HCL 1000 MG PO TABS Oral Take 1,000 mg by mouth daily with breakfast.    . METOPROLOL SUCCINATE ER 25 MG PO TB24 Oral Take 1 tablet (25 mg total) by mouth daily. 30 tablet 0  . POTASSIUM CHLORIDE ER 10 MEQ PO CPCR Oral Take 10 mEq by mouth 2 (two) times daily.      BP 110/74  Pulse 84  Temp 99.5 F (37.5 C) (Oral)  Resp 20  SpO2 100%  Physical Exam  Constitutional: She appears well-developed and well-nourished.  HENT:  Head: Normocephalic and atraumatic.  Cardiovascular: Normal rate, regular rhythm, normal heart sounds and intact distal pulses.        2+ pitting edema noted on lower extremities bilaterally.  Pulmonary/Chest: Effort normal. No respiratory distress. She has wheezes. She has no rales.  Abdominal: Soft.    ED Course  Procedures (including critical care time)  Labs Reviewed  CBC WITH DIFFERENTIAL - Abnormal; Notable for the following:    WBC 2.6 (*)     RBC 3.77 (*)     Hemoglobin 11.6 (*)     HCT 35.0 (*)     Neutrophils Relative 40 (*)     Neutro Abs 1.1 (*)     Monocytes Relative 15 (*)     Eosinophils Relative 6 (*)     Basophils Relative 3 (*)     All other components within normal limits  BASIC METABOLIC PANEL - Abnormal; Notable for the following:    Glucose, Bld 140 (*)     GFR calc non Af Amer 75 (*)     GFR calc Af Amer 87 (*)     All other components within normal limits  PRO B NATRIURETIC PEPTIDE - Abnormal; Notable for the following:    Pro B Natriuretic peptide (BNP) 3170.0 (*)     All other components within normal limits  TROPONIN I   Dg Chest Portable 1 View  02/18/2012  *RADIOLOGY REPORT*  Clinical Data: Bilateral leg swelling,  hypertension, diabetes, asthma, shortness of breath  PORTABLE CHEST - 1 VIEW  Comparison: Portable exam 1052 hours compared to 10/21/2010  Findings: Enlargement of cardiac silhouette with pulmonary vascular congestion. Calcified tortuous aorta. Bibasilar atelectasis. No gross failure or consolidation. Prominent mediastinum likely accentuated  by rotation to the right but there appears to be chronic enlargement of the left paratracheal soft tissues question related to thyroid enlargement or mass unchanged since 11/23/2009. Question tiny left pleural effusion. No pneumothorax.  IMPRESSION: Enlargement of cardiac silhouette with pulmonary vascular congestion. Bibasilar atelectasis with question tiny left pleural effusion. Chronic enlargement of left superior mediastinal soft tissues with mass effect upon the trachea, question related to thyroid enlargement/goiter versus mass, unchanged since 2011.  Original Report Authenticated By: Lollie Marrow, M.D.     No diagnosis found.  Results for orders placed during the hospital encounter of 02/18/12  CBC WITH DIFFERENTIAL      Component Value Range   WBC 2.6 (*) 4.0 - 10.5 K/uL   RBC 3.77 (*) 3.87 - 5.11 MIL/uL   Hemoglobin 11.6 (*) 12.0 - 15.0 g/dL   HCT 54.0 (*) 98.1 - 19.1 %   MCV 92.8  78.0 - 100.0 fL   MCH 30.8  26.0 - 34.0 pg   MCHC 33.1  30.0 - 36.0 g/dL   RDW 47.8  29.5 - 62.1 %   Platelets 236  150 - 400 K/uL   Neutrophils Relative 40 (*) 43 - 77 %   Neutro Abs 1.1 (*) 1.7 - 7.7 K/uL   Lymphocytes Relative 37  12 - 46 %   Lymphs Abs 1.0  0.7 - 4.0 K/uL   Monocytes Relative 15 (*) 3 - 12 %   Monocytes Absolute 0.4  0.1 - 1.0 K/uL   Eosinophils Relative 6 (*) 0 - 5 %   Eosinophils Absolute 0.2  0.0 - 0.7 K/uL   Basophils Relative 3 (*) 0 - 1 %   Basophils Absolute 0.1  0.0 - 0.1 K/uL  BASIC METABOLIC PANEL      Component Value Range   Sodium 137  135 - 145 mEq/L   Potassium 4.7  3.5 - 5.1 mEq/L   Chloride 101  96 - 112 mEq/L   CO2 24  19  - 32 mEq/L   Glucose, Bld 140 (*) 70 - 99 mg/dL   BUN 17  6 - 23 mg/dL   Creatinine, Ser 3.08  0.50 - 1.10 mg/dL   Calcium 8.9  8.4 - 65.7 mg/dL   GFR calc non Af Amer 75 (*) >90 mL/min   GFR calc Af Amer 87 (*) >90 mL/min  TROPONIN I      Component Value Range   Troponin I <0.30  <0.30 ng/mL  PRO B NATRIURETIC PEPTIDE      Component Value Range   Pro B Natriuretic peptide (BNP) 3170.0 (*) 0 - 450 pg/mL     MDM  Patient is an 78 y/o female with a history of dilated CMO who presents with bilateral worsening leg edema. She denies SOB, DOE, CP, orthopnea. CBC, BMP, Troponin unremarkable. BNP elevated when compared to prior results. CXR remarkable for a small left sided pleural effusion and pulmonary congestion. O2% is 100% on room air. Home lasix is 20mg ; 40mg  given in ED for diuresis. On re-evaluation patient is diuresing and she feels improved. Discussed with [facility] resident Dr. Tonny Branch, who will arrange close follow-up in office. Patient is stable for discharge based on lab evidence that she is not in fulminant failure and improved in the ER. Patient comfortable with discharge home on 40mg  of Lasix. These findings were discussed with Dr. Jannifer Franklin.    Pixie Casino, PA-C 02/18/12 1355

## 2012-02-18 NOTE — ED Notes (Signed)
Pt presents to department for evaluation of bilateral lower leg swelling and pain. Onset x3 days ago. Pitting edema noted to bilateral lower legs. 8/10 pain at the time. Denies chest pain. Respirations unlabored. Pt is alert and oriented x4.

## 2012-02-18 NOTE — ED Provider Notes (Signed)
Medical screening examination/treatment/procedure(s) were conducted as a shared visit with non-physician practitioner(s) and myself.  I personally evaluated the patient during the encounter  Cheri Guppy, MD 02/18/12 [phone]

## 2012-02-19 ENCOUNTER — Other Ambulatory Visit: Payer: Self-pay | Admitting: Internal Medicine

## 2012-02-23 NOTE — Telephone Encounter (Signed)
I am refusing this opiate refill because I cannot find a diagnosis for it.  If she has pain, she will have to be seen.

## 2012-02-26 ENCOUNTER — Ambulatory Visit (INDEPENDENT_AMBULATORY_CARE_PROVIDER_SITE_OTHER): Payer: PRIVATE HEALTH INSURANCE | Admitting: Internal Medicine

## 2012-02-26 ENCOUNTER — Encounter: Payer: Self-pay | Admitting: Internal Medicine

## 2012-02-26 VITALS — BP 162/97 | HR 94 | Temp 97.6°F | Ht 63.0 in | Wt 196.4 lb

## 2012-02-26 DIAGNOSIS — I10 Essential (primary) hypertension: Secondary | ICD-10-CM

## 2012-02-26 DIAGNOSIS — J45909 Unspecified asthma, uncomplicated: Secondary | ICD-10-CM

## 2012-02-26 DIAGNOSIS — E119 Type 2 diabetes mellitus without complications: Secondary | ICD-10-CM

## 2012-02-26 DIAGNOSIS — I428 Other cardiomyopathies: Secondary | ICD-10-CM

## 2012-02-26 DIAGNOSIS — I429 Cardiomyopathy, unspecified: Secondary | ICD-10-CM

## 2012-02-26 MED ORDER — FUROSEMIDE 40 MG PO TABS: 40.0000 mg | ORAL_TABLET | Freq: Every day | ORAL | Status: DC

## 2012-02-26 NOTE — Assessment & Plan Note (Signed)
She is currently taking metformin 1000 mg twice a day. Her previous A1c was 7.6 at 01/21/12. We'll continue current regimen.  Marland Kitchen

## 2012-02-26 NOTE — Assessment & Plan Note (Signed)
Patient bilateral leg edema is likely due to cardiomyopathy. Patient may have mild congestive heart failure. Her pro BNP was 3170 at 02/18/12. Currently she is taking Lasix 20 mg daily, lisinopril and metoprolol. But her leg edema has not improved recently. Patient does not seem to have pulmonary edema given no rales or crackles on auscultation.   -Will increase her Lasix dose from 20 mg to 40 mg daily. We'll followup the patient's response in one week.

## 2012-02-26 NOTE — Assessment & Plan Note (Signed)
Patient is taking Proventil inhaler and a Advair.  Auscultation showed mild wheezing. She feels like her condition is at her baseline. We'll continue current regimen.

## 2012-02-26 NOTE — Progress Notes (Signed)
Patient ID: [name], female   DOB: 1926-03-07, 78 y.o.   MRN: [id number]  Subjective:   Patient ID: [name] female   DOB: [birth year] 78 y.o.   MRN: [id number]  HPI: [name] is a 78 y.o. with a past medical history as outlined below, who presents for a regular checkup.  Patient's reports that he's currently taking Lasix 20 mg daily, but her leg edema has not improved significantly. She still has severe bilateral leg edema,  worse on the left side. She also reports having mild shortness of breath which is near her baseline.  She does not have cough, chest pain, or palpitation.  Regarding her diabetes, she is currently taking metformin 1000 mg twice a day. Her previous A1c was 7.6 at 01/21/12.  Regarding her hypertension, she is currently taking lisinopril, Lasix and metoprolol. Today her blood pressure is 151/92. Repeat blood pressure is 162/97.  Regarding her hypercholesterolemia, patient is not taking any medications currently. Her LDL was 89 at 07/16/11.  Regarding her asthma, patient isn't taking Proventil inhaler and a Advair. She feels like her condition is at her baseline.    Past Medical History  Diagnosis Date  . Hypertension   . Diabetes mellitus   . Asthma   . Osteoarthritis     left shoulder  . Pseudogout   . Hypokalemia     2/2 HCTZ on chronic K oral supp  . 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 Grave's disease     s/p thyroidectomy  . Glaucoma   . Hypercholesteremia   . Venous insufficiency   . Cardiomyopathy     Dilated CM, EF 50-55% 2003  . Osteopenia     DEXA 2004  . Lipoma     left inner thigh  . Cataract     left eye  . VASCULITIS 07/27/2006   Current Outpatient Prescriptions  Medication Sig Dispense Refill  . aspirin 81 MG tablet Take 81 mg by mouth daily.        . brimonidine (ALPHAGAN P) 0.1 % SOLN Place 1 drop into the left eye every 12 (twelve) hours.       . dorzolamide (TRUSOPT) 2 % ophthalmic solution Place  1 drop into the left eye 2 (two) times daily.       . Fluticasone-Salmeterol (ADVAIR DISKUS) 100-50 MCG/DOSE AEPB Inhale 1 puff into the lungs 2 (two) times daily.       . furosemide (LASIX) 20 MG tablet Take 1 tablet (20 mg total) by mouth daily.  30 tablet  0  . lisinopril (PRINIVIL,ZESTRIL) 20 MG tablet Take 1 tablet (20 mg total) by mouth daily.  30 tablet  5  . metFORMIN (GLUCOPHAGE) 1000 MG tablet Take 1,000 mg by mouth daily with breakfast.      . metoprolol succinate (TOPROL XL) 25 MG 24 hr tablet Take 1 tablet (25 mg total) by mouth daily.  30 tablet  0  . potassium chloride (MICRO-K) 10 MEQ CR capsule Take 10 mEq by mouth 2 (two) times daily.      Marland Kitchen albuterol (PROVENTIL HFA;VENTOLIN HFA) 108 (90 BASE) MCG/ACT inhaler Inhale 1-2 puffs into the lungs every 6 (six) hours as needed. Shortness of breath       No family history on file. History   Social History  . Marital Status: Widowed    Spouse Name: N/A    Number of Children: N/A  . Years of Education: N/A   Social History Main  Topics  . Smoking status: Never Smoker   . Smokeless tobacco: None  . Alcohol Use: No  . Drug Use: No  . Sexually Active: None   Other Topics Concern  . None   Social History Narrative   Lives in house, with family just next door.never smokerno alcohol.   Review of Systems:   General: no fevers, chills, no changes in body weight, no changes in appetite Skin: no rash HEENT: no blurry vision, hearing changes or sore throat Pulm: no dyspnea, coughing, wheezing CV: no chest pain, palpitations, shortness of breath Abd: no nausea/vomiting, abdominal pain, diarrhea/constipation GU: no dysuria, hematuria, polyuria Ext: has bilateral leg edema. no arthralgias, myalgias Neuro: no weakness, numbness, or tingling  Objective:  Physical Exam: Filed Vitals:   02/26/12 0904 02/26/12 0918  BP: 151/92 162/97  Pulse: 96 94  Temp: 97.6 F (36.4 C)   TempSrc: Oral   Height: 5\' 3"  (1.6 m)   Weight: 196  lb 6.4 oz (89.086 kg)   SpO2: 97%    General: resting in bed, not in acute distress HEENT: PERRL, EOMI, no scleral icterus. Bilateral external jugular vein distension. Cardiac: S1/S2, RRR, No murmurs, gallops or rubs Pulm: Good air movement bilaterally, there is mild expiratory wheezing bilaterally.  No rales, rhonchi or rubs. Abd: Soft,  nondistended, nontender, no rebound pain, no organomegaly, BS present Ext:  2+DP/PT pulse bilaterally. bilateral pitting edema in legs, 2+ on the right and 3+ on the left side. Musculoskeletal: No joint deformities, erythema, or stiffness, ROM full and no nontender Skin: no rashes. No skin bruise. Neuro: alert and oriented X3, cranial nerves II-XII grossly intact, muscle strength 5/5 in all extremeties,  sensation to light touch intact.  Psych.: patient is not psychotic, no suicidal or hemocidal ideation.   Assessment & Plan:

## 2012-02-26 NOTE — Patient Instructions (Signed)
1. I increased your Lasix dose from 20 mg to 40 mg daily. Please take 40 mg Lasix daily from now. Please continue to take your potassium pills. 2. Please take all medications as prescribed.  3. If you have worsening of your symptoms or new symptoms arise, please call the clinic ([phone]), or go to the ER immediately if symptoms are severe.

## 2012-02-26 NOTE — Assessment & Plan Note (Signed)
she is currently taking lisinopril, Lasix and metoprolol. Today her blood pressure is 151/92. Repeat blood pressure is 162/97. Her Lasix dose will be increased to 40 mg daily for her leg edema, which showed also improve her blood pressure control.

## 2012-03-04 ENCOUNTER — Encounter (HOSPITAL_COMMUNITY): Payer: Self-pay | Admitting: General Practice

## 2012-03-04 ENCOUNTER — Ambulatory Visit (HOSPITAL_COMMUNITY)
Admission: RE | Admit: 2012-03-04 | Discharge: 2012-03-04 | Disposition: A | Discharge status: Home / Self Care | Payer: PRIVATE HEALTH INSURANCE | Source: Ambulatory Visit | Attending: Internal Medicine | Admitting: Internal Medicine

## 2012-03-04 ENCOUNTER — Inpatient Hospital Stay (HOSPITAL_COMMUNITY)
Admission: AD | Admit: 2012-03-04 | Discharge: 2012-03-12 | DRG: 292 | Disposition: A | Discharge status: Home Health | Payer: PRIVATE HEALTH INSURANCE | Source: Ambulatory Visit | Attending: Internal Medicine | Admitting: Internal Medicine

## 2012-03-04 ENCOUNTER — Ambulatory Visit (INDEPENDENT_AMBULATORY_CARE_PROVIDER_SITE_OTHER): Payer: PRIVATE HEALTH INSURANCE | Admitting: Internal Medicine

## 2012-03-04 ENCOUNTER — Encounter: Payer: Self-pay | Admitting: Internal Medicine

## 2012-03-04 VITALS — BP 127/86 | HR 100 | Temp 98.9°F | Ht 63.0 in | Wt 199.8 lb

## 2012-03-04 DIAGNOSIS — D72819 Decreased white blood cell count, unspecified: Secondary | ICD-10-CM | POA: Diagnosis present

## 2012-03-04 DIAGNOSIS — J45909 Unspecified asthma, uncomplicated: Secondary | ICD-10-CM | POA: Diagnosis present

## 2012-03-04 DIAGNOSIS — I5023 Acute on chronic systolic (congestive) heart failure: Principal | ICD-10-CM | POA: Diagnosis present

## 2012-03-04 DIAGNOSIS — H409 Unspecified glaucoma: Secondary | ICD-10-CM | POA: Diagnosis present

## 2012-03-04 DIAGNOSIS — I509 Heart failure, unspecified: Secondary | ICD-10-CM

## 2012-03-04 DIAGNOSIS — I428 Other cardiomyopathies: Secondary | ICD-10-CM | POA: Diagnosis present

## 2012-03-04 DIAGNOSIS — I471 Supraventricular tachycardia: Secondary | ICD-10-CM

## 2012-03-04 DIAGNOSIS — I5022 Chronic systolic (congestive) heart failure: Secondary | ICD-10-CM

## 2012-03-04 DIAGNOSIS — R9431 Abnormal electrocardiogram [ECG] [EKG]: Secondary | ICD-10-CM | POA: Insufficient documentation

## 2012-03-04 DIAGNOSIS — I872 Venous insufficiency (chronic) (peripheral): Secondary | ICD-10-CM | POA: Diagnosis present

## 2012-03-04 DIAGNOSIS — E785 Hyperlipidemia, unspecified: Secondary | ICD-10-CM | POA: Diagnosis present

## 2012-03-04 DIAGNOSIS — K219 Gastro-esophageal reflux disease without esophagitis: Secondary | ICD-10-CM | POA: Diagnosis present

## 2012-03-04 DIAGNOSIS — R197 Diarrhea, unspecified: Secondary | ICD-10-CM | POA: Diagnosis present

## 2012-03-04 DIAGNOSIS — I10 Essential (primary) hypertension: Secondary | ICD-10-CM | POA: Diagnosis present

## 2012-03-04 DIAGNOSIS — K2970 Gastritis, unspecified, without bleeding: Secondary | ICD-10-CM | POA: Diagnosis present

## 2012-03-04 DIAGNOSIS — IMO0001 Reserved for inherently not codable concepts without codable children: Secondary | ICD-10-CM | POA: Diagnosis present

## 2012-03-04 DIAGNOSIS — R0602 Shortness of breath: Secondary | ICD-10-CM

## 2012-03-04 DIAGNOSIS — E119 Type 2 diabetes mellitus without complications: Secondary | ICD-10-CM

## 2012-03-04 DIAGNOSIS — E876 Hypokalemia: Secondary | ICD-10-CM | POA: Diagnosis present

## 2012-03-04 DIAGNOSIS — Z7982 Long term (current) use of aspirin: Secondary | ICD-10-CM

## 2012-03-04 DIAGNOSIS — M7989 Other specified soft tissue disorders: Secondary | ICD-10-CM

## 2012-03-04 DIAGNOSIS — I498 Other specified cardiac arrhythmias: Secondary | ICD-10-CM | POA: Diagnosis present

## 2012-03-04 DIAGNOSIS — Z79899 Other long term (current) drug therapy: Secondary | ICD-10-CM

## 2012-03-04 HISTORY — DX: Heart failure, unspecified: I50.9

## 2012-03-04 LAB — COMPREHENSIVE METABOLIC PANEL
Alkaline Phosphatase: 75 U/L (ref 39–117)
BUN: 14 mg/dL (ref 6–23)
CO2: 26 mEq/L (ref 19–32)
Chloride: 102 mEq/L (ref 96–112)
Creatinine, Ser: 0.74 mg/dL (ref 0.50–1.10)
GFR calc Af Amer: 88 mL/min — ABNORMAL LOW (ref >90)
GFR calc non Af Amer: 76 mL/min — ABNORMAL LOW (ref >90)
Glucose, Bld: 187 mg/dL — ABNORMAL HIGH (ref 70–99)
Potassium: 3.2 mEq/L — ABNORMAL LOW (ref 3.5–5.1)
Total Bilirubin: 0.8 mg/dL (ref 0.3–1.2)

## 2012-03-04 LAB — PRO B NATRIURETIC PEPTIDE: Pro B Natriuretic peptide (BNP): 3468 pg/mL — ABNORMAL HIGH (ref 0–450)

## 2012-03-04 LAB — CBC WITH DIFFERENTIAL/PLATELET
Hemoglobin: 12.2 g/dL (ref 12.0–15.0)
Lymphocytes Relative: 29 % (ref 12–46)
Lymphs Abs: 0.9 10*3/uL (ref 0.7–4.0)
Monocytes Relative: 14 % — ABNORMAL HIGH (ref 3–12)
Neutro Abs: 1.4 10*3/uL — ABNORMAL LOW (ref 1.7–7.7)
Neutrophils Relative %: 46 % (ref 43–77)
RBC: 3.98 MIL/uL (ref 3.87–5.11)
WBC: 3 10*3/uL — ABNORMAL LOW (ref 4.0–10.5)

## 2012-03-04 LAB — GLUCOSE, CAPILLARY
Glucose-Capillary: 148 mg/dL — ABNORMAL HIGH (ref 70–99)
Glucose-Capillary: 146 mg/dL — ABNORMAL HIGH (ref 70–99)

## 2012-03-04 LAB — CARDIAC PANEL(CRET KIN+CKTOT+MB+TROPI)
CK, MB: 3.9 ng/mL (ref 0.3–4.0)
CK, MB: 3.8 ng/mL (ref 0.3–4.0)
Relative Index: 3.4 — ABNORMAL HIGH (ref 0.0–2.5)
Total CK: 102 U/L (ref 7–177)

## 2012-03-04 LAB — MAGNESIUM: Magnesium: 1.9 mg/dL (ref 1.5–2.5)

## 2012-03-04 LAB — T4, FREE: Free T4: 1.33 ng/dL (ref 0.80–1.80)

## 2012-03-04 MED ORDER — METOPROLOL SUCCINATE ER 25 MG PO TB24
25.0000 mg | ORAL_TABLET | Freq: Every day | ORAL | Status: DC
  Administered 2012-03-04 – 2012-03-06 (×3): 25 mg via ORAL
  Filled 2012-03-04 (×3): qty 1

## 2012-03-04 MED ORDER — ONDANSETRON HCL 4 MG/2ML IJ SOLN
4.0000 mg | Freq: Four times a day (QID) | INTRAMUSCULAR | Status: DC | PRN
  Administered 2012-03-06: 4 mg via INTRAVENOUS
  Filled 2012-03-04: qty 2

## 2012-03-04 MED ORDER — BRIMONIDINE TARTRATE 0.2 % OP SOLN
1.0000 [drp] | Freq: Two times a day (BID) | OPHTHALMIC | Status: DC
  Administered 2012-03-04 – 2012-03-12 (×17): 1 [drp] via OPHTHALMIC
  Filled 2012-03-04: qty 5

## 2012-03-04 MED ORDER — INSULIN ASPART 100 UNIT/ML ~~LOC~~ SOLN
0.0000 [IU] | Freq: Three times a day (TID) | SUBCUTANEOUS | Status: DC
  Administered 2012-03-04: 1 [IU] via SUBCUTANEOUS
  Administered 2012-03-05: 2 [IU] via SUBCUTANEOUS
  Administered 2012-03-05: 1 [IU] via SUBCUTANEOUS
  Administered 2012-03-06 (×2): 2 [IU] via SUBCUTANEOUS
  Administered 2012-03-06 – 2012-03-08 (×4): 1 [IU] via SUBCUTANEOUS
  Administered 2012-03-09: 3 [IU] via SUBCUTANEOUS
  Administered 2012-03-09: 2 [IU] via SUBCUTANEOUS
  Administered 2012-03-10: 3 [IU] via SUBCUTANEOUS
  Administered 2012-03-11 – 2012-03-12 (×2): 2 [IU] via SUBCUTANEOUS
  Administered 2012-03-12: 1 [IU] via SUBCUTANEOUS

## 2012-03-04 MED ORDER — LISINOPRIL 20 MG PO TABS
20.0000 mg | ORAL_TABLET | Freq: Every day | ORAL | Status: DC
  Administered 2012-03-04 – 2012-03-09 (×6): 20 mg via ORAL
  Filled 2012-03-04 (×8): qty 1

## 2012-03-04 MED ORDER — FLUTICASONE-SALMETEROL 100-50 MCG/DOSE IN AEPB
1.0000 | INHALATION_SPRAY | Freq: Two times a day (BID) | RESPIRATORY_TRACT | Status: DC
  Administered 2012-03-04 – 2012-03-05 (×3): 1 via RESPIRATORY_TRACT
  Filled 2012-03-04: qty 14

## 2012-03-04 MED ORDER — DORZOLAMIDE HCL 2 % OP SOLN
1.0000 [drp] | Freq: Two times a day (BID) | OPHTHALMIC | Status: DC
  Administered 2012-03-04 – 2012-03-12 (×17): 1 [drp] via OPHTHALMIC
  Filled 2012-03-04: qty 10

## 2012-03-04 MED ORDER — ALBUTEROL SULFATE (5 MG/ML) 0.5% IN NEBU
2.5000 mg | INHALATION_SOLUTION | RESPIRATORY_TRACT | Status: DC | PRN
  Administered 2012-03-05: 2.5 mg via RESPIRATORY_TRACT
  Filled 2012-03-04: qty 0.5

## 2012-03-04 MED ORDER — ASPIRIN 81 MG PO CHEW
81.0000 mg | CHEWABLE_TABLET | Freq: Every day | ORAL | Status: DC
  Administered 2012-03-04 – 2012-03-12 (×9): 81 mg via ORAL
  Filled 2012-03-04 (×7): qty 1

## 2012-03-04 MED ORDER — SODIUM CHLORIDE 0.9 % IJ SOLN
3.0000 mL | Freq: Two times a day (BID) | INTRAMUSCULAR | Status: DC
  Administered 2012-03-04 – 2012-03-12 (×16): 3 mL via INTRAVENOUS

## 2012-03-04 MED ORDER — HEPARIN SODIUM (PORCINE) 5000 UNIT/ML IJ SOLN
5000.0000 [IU] | Freq: Three times a day (TID) | INTRAMUSCULAR | Status: DC
  Administered 2012-03-04 – 2012-03-12 (×23): 5000 [IU] via SUBCUTANEOUS
  Filled 2012-03-04 (×27): qty 1

## 2012-03-04 MED ORDER — IPRATROPIUM BROMIDE 0.02 % IN SOLN
0.5000 mg | RESPIRATORY_TRACT | Status: DC | PRN
  Administered 2012-03-05: 0.5 mg via RESPIRATORY_TRACT
  Filled 2012-03-04: qty 2.5

## 2012-03-04 MED ORDER — BRIMONIDINE TARTRATE 0.1 % OP SOLN: 1.0000 [drp] | Freq: Two times a day (BID) | OPHTHALMIC | Status: DC

## 2012-03-04 MED ORDER — FUROSEMIDE 10 MG/ML IJ SOLN
40.0000 mg | Freq: Two times a day (BID) | INTRAMUSCULAR | Status: DC
  Administered 2012-03-05 (×2): 40 mg via INTRAVENOUS
  Filled 2012-03-04 (×6): qty 4

## 2012-03-04 MED ORDER — BIOTENE DRY MOUTH MT LIQD
15.0000 mL | Freq: Two times a day (BID) | OROMUCOSAL | Status: DC
  Administered 2012-03-05 – 2012-03-12 (×12): 15 mL via OROMUCOSAL

## 2012-03-04 MED ORDER — ASPIRIN 81 MG PO TABS: 81.0000 mg | ORAL_TABLET | Freq: Every day | ORAL | Status: DC

## 2012-03-04 MED ORDER — ACETAMINOPHEN 325 MG PO TABS
650.0000 mg | ORAL_TABLET | ORAL | Status: DC | PRN
  Administered 2012-03-06: 650 mg via ORAL
  Filled 2012-03-04: qty 2

## 2012-03-04 MED ORDER — SODIUM CHLORIDE 0.9 % IJ SOLN
3.0000 mL | INTRAMUSCULAR | Status: DC | PRN
  Administered 2012-03-09: 3 mL via INTRAVENOUS

## 2012-03-04 MED ORDER — FUROSEMIDE 10 MG/ML IJ SOLN
40.0000 mg | Freq: Every day | INTRAMUSCULAR | Status: DC
  Administered 2012-03-04: 40 mg via INTRAVENOUS
  Filled 2012-03-04: qty 4

## 2012-03-04 NOTE — Assessment & Plan Note (Addendum)
Patient's bilateral leg edema is likely due to congestive heart failure. Her pro BNP was 3170 at 02/18/12.  We increased her Lasix dose from 20 mg daily to 40 mg daily. She had good urine output after she started taking high dose of Lasix, but her leg edema did not improve. Her external jugular vein is extended. Her lung auscultation showed bilateral wheezing which was worse than previous visit. Her worsening wheezing could be from her chronic asthma, but it could also be from a cardiogenic source.   -will admit patient to Telemetry bed. -will get EKG -will suggest admitting team to repeat 2 D echo and venous doppler to rule out DVT given her asymmetric lower leg edema.

## 2012-03-04 NOTE — H&P (Signed)
Hospital Admission Note Date: 03/04/2012  Patient name: [name] Medical record number: [id number] Date of birth: Mar 05, 1926 Age: 78 y.o. Gender: female PCP: Lars Mage, MD  Medical Service: Internal Medicine Teaching Service, [facility]  Attending physician:  Dr. Blanch Media    1st Contact: Dr. Gwynn Burly  Pager: [phone] 2nd Contact: Dr. Johnette Abraham Pager: [phone] After 5 pm or weekends: 1st Contact:      Pager: [phone] 2nd Contact:      Pager: [phone]  Chief Complaint: bilateral leg swelling, shortness of breath  History of Present Illness: [name] is a 78 year old woman with a PMH significant for dilated cardiomyopathy, multifocal atrial tachycardia, hypertension, diabetes, grave's disease, hyperlipidemia, and pseudogout who presented to the internal medicine outpatient clinic complaining of worsening bilateral leg swelling over the last 2 months.  Over that same period she has noticed that she has gained about 20 lbs without trying as well as becoming more short of breath whenever she does any activity.  She denies chest pain, orthopnea, cough, fever, or chills.  She was seen in the [facility] on 7/11 and they increased her lasix to 40 mg daily.  She states that it did not help and over the week between visits she still gained 8 lbs.  She states that she was urinating often and without problems with the medication.    Meds: Current Outpatient Prescriptions on File Prior to Encounter  Medication Sig Dispense Refill  . albuterol (PROVENTIL HFA;VENTOLIN HFA) 108 (90 BASE) MCG/ACT inhaler Inhale 1-2 puffs into the lungs every 6 (six) hours as needed. Shortness of breath      . aspirin 81 MG tablet Take 81 mg by mouth daily.        . brimonidine (ALPHAGAN P) 0.1 % SOLN Place 1 drop into the left eye every 12 (twelve) hours.       . dorzolamide (TRUSOPT) 2 % ophthalmic solution Place 1 drop into the left eye 2 (two) times daily.       . Fluticasone-Salmeterol (ADVAIR  DISKUS) 100-50 MCG/DOSE AEPB Inhale 1 puff into the lungs 2 (two) times daily.       . potassium chloride (MICRO-K) 10 MEQ CR capsule Take 10 mEq by mouth 2 (two) times daily.       Allergies: Allergies as of 03/04/2012  . (No Known Allergies)   Past Medical History  Diagnosis Date  . Hypertension   . Diabetes mellitus   . Asthma   . Osteoarthritis     left shoulder  . Pseudogout   . Hypokalemia     2/2 HCTZ on chronic K oral supp  . 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 Grave's disease     s/p thyroidectomy  . Glaucoma   . Hypercholesteremia   . Venous insufficiency   . Cardiomyopathy     Dilated CM, EF 50-55% 2003  . Osteopenia     DEXA 2004  . Lipoma     left inner thigh  . Cataract     left eye  . VASCULITIS 07/27/2006  . CHF (congestive heart failure)    Past Surgical History  Procedure Date  . Abdominal hysterectomy   . Salpingoophorectomy   . Thyroidectomy   . Rt hip    History reviewed. No pertinent family history. History   Social History  . Marital Status: Widowed    Spouse Name: N/A    Number of Children: N/A  . Years of Education: N/A  Occupational History  . Not on file.   Social History Main Topics  . Smoking status: Never Smoker   . Smokeless tobacco: Never Used  . Alcohol Use: No  . Drug Use: No  . Sexually Active: No   Other Topics Concern  . Not on file   Social History Narrative   Lives in house, with family just next door.never smokerno alcohol.   Review of Systems: Constitutional: Denies fever, chills, diaphoresis, appetite change and fatigue.  HEENT: Denies photophobia, eye pain, redness, hearing loss, ear pain, congestion, sore throat, rhinorrhea, sneezing, mouth sores, trouble swallowing, neck pain, neck stiffness and tinnitus.   Respiratory: Positive for SOB, DOE.  Denies cough, chest tightness,  and wheezing.   Cardiovascular: Positive for  leg swelling.  Denies chest pain, palpitations.    Gastrointestinal: Denies nausea, vomiting, abdominal pain, diarrhea, constipation, blood in stool and abdominal distention.  Genitourinary: Denies dysuria, urgency, frequency, hematuria, flank pain and difficulty urinating.  Musculoskeletal: Denies myalgias, back pain, joint swelling, arthralgias and gait problem.  Skin: Denies pallor, rash and wound.  Neurological: Denies dizziness, seizures, syncope, weakness, light-headedness, numbness and headaches.  Hematological: Denies adenopathy. Easy bruising, personal or family bleeding history  Psychiatric/Behavioral: Denies suicidal ideation, mood changes, confusion, nervousness, sleep disturbance and agitation  Physical Exam: Blood pressure 151/94, pulse 97, temperature 99 F (37.2 C), temperature source Oral, resp. rate 20, height 5\' 7"  (1.702 m), weight 198 lb 3.1 oz (89.9 kg), SpO2 96.00%. Constitutional: Vital signs reviewed.  Patient is a well-developed and well-nourished elderly woman in no acute distress and cooperative with exam. Alert and oriented x3.  Head: Normocephalic and atraumatic Ear: TM normal bilaterally Mouth: no erythema or exudates, MMM Eyes: PERRL, EOMI, conjunctivae normal, No scleral icterus.  Neck: Supple, JVD to the angle of the jaw.  Trachea midline normal ROM, No mass, thyromegaly, or carotid bruit present.  Cardiovascular: irregularly irregular rate.  2/6 systolic murmur best heard at the apex.  S1 normal, S2 normal, no RG, pulses symmetric and intact bilaterally Pulmonary/Chest: mild bibasilar crackles noted on inspiration. Mild upper airway stridor noted. no wheezes, rales, or rhonchi Abdominal: Soft. Non-tender, non-distended, bowel sounds are normal, no masses, organomegaly, or guarding present.  GU: no CVA tenderness Musculoskeletal: 3+ pitting edema bilaterally with L>R.  Right leg edema extends to the knee while left extends to the upper thigh.  There is presacral edema.  No pain to palpation of the bilateral  calves.  No joint deformities, erythema, or stiffness, ROM full and no nontender Hematology: no cervical, inginal, or axillary adenopathy.  Neurological: A&O x3, Strength is normal and symmetric bilaterally, cranial nerve II-XII are grossly intact, no focal motor deficit, sensory intact to light touch bilaterally.  Skin: Warm, dry and intact. No rash, cyanosis, or clubbing.  Psychiatric: Normal mood and affect. speech and behavior is normal. Judgment and thought content normal. Cognition and memory are normal.   Lab results: Basic Metabolic Panel:  Basename 03/04/12 1300  NA 140  K 3.2*  CL 102  CO2 26  GLUCOSE 187*  BUN 14  CREATININE 0.74  CALCIUM 9.2  MG 1.9  PHOS --   Liver Function Tests:  Basename 03/04/12 1300  AST 18  ALT 7  ALKPHOS 75  BILITOT 0.8  PROT 6.9  ALBUMIN 3.7   CBC:  Basename 03/04/12 1300  WBC 3.0*  NEUTROABS 1.4*  HGB 12.2  HCT 37.2  MCV 93.5  PLT 233   Cardiac Enzymes:  Basename 03/04/12 1300  CKTOTAL 115  CKMB 3.9  CKMBINDEX --  TROPONINI <0.30   BNP:  Basename 03/04/12 1300  PROBNP 3468.0*   CBG:  Basename 03/04/12 0904  GLUCAP 148*   Other results: EKG: sinus rhythm with premature atrial complexes as well as PVC.  There is a loss of R wave progression in the precordial leads that has changed mildly since the previous (previous only had R wave in V6).  I, aVL and V6 have inverted T waves unchanged from previous.    Assessment & Plan by Problem: 1. Bilateral leg swelling:  Ms. Minch has a 2 month history of increasing lower extremity swelling that is mildly concerning because of its obvious asymmetry.  Her last Echocardiogram was in 2003 and showed a EF on the lower end of normal around 50-55%.  She has no classic orthopnea symptoms but also has some trouble breathing on exertion as well as occasional PND at night time per her report.  Differential diagnosis includes heart failure, both diastolic or systolic, nephrotic syndrome.   Her Creatinine is normal and she has no increased urination before the start of the Lasix.  With her previously low normal EF and obvious signs of fluid overload she likely has a worsening of CHF.  Because of the asymmetry we will have to check a venous doppler for possible DVT but I think it is much less likely.  - Admit to tele  - Lasix 40 mg IV BID for now but will likely need to increase it  - Daily weights  - Strict I&O  - 2D echo  - Venous doppler to r/o DVT  - Optimize her medical therapy if possible with increases of her lisinopril and metoprolol  - CE x3 to rule out ischemia as the cause for worsening  - Repeat EKG because of the loss of anterior forces that are noted on the EKG from clinic  - TED hose   2. Diabetes Mellitus Type II:  Her last A1C was relatively well controlled at 7.6 on metformin at home.  We will hold while she is in the hospital and cover her with SSI.  She will likely be able to go back on it as an outpatient and will likely need an addition of a second agent like Glipizide.  3.  Hypertension: Her BP is mildly elevated on evaluation in the ED today and she is mildly tachycardic.  With the need for diuresis we will continue her lisinopril and Metoprolol at her home doses for now but if she continues to be hypertensive we will need to push her Lisinopril and metoprolol higher for better control.  4.  Multifocal atrial tachycardia:  On admission her pulse was irregular and she was mildly tachycardic.  We will continue to monitor with diuresis and likely need to increase her metoprolol for better control of her heart rate.   5. Shortness of breath:  She has a history of "asthma" and is on Advair as an outpatient and uses her albuterol several times a week.  She has no PFTs that I see.  While in the hospital we will continue Duonebs PRN.    6. VTE: Heparin  Signed: Payal Stanforth 03/04/2012, 4:08 PM

## 2012-03-04 NOTE — Progress Notes (Signed)
Report called to RN on 4700.

## 2012-03-04 NOTE — Progress Notes (Signed)
1005AM - #22G angiocath  NSL started in right wrist; site unremarkable; blood return noted.

## 2012-03-04 NOTE — Progress Notes (Signed)
Utilization Review Completed.[name] T7/18/2013   

## 2012-03-04 NOTE — Progress Notes (Signed)
1017 telephone  Report received from [place] , Clinic RN 1130 To rm [phone] with family member .MD at bedside discussed DX and plan of care . Verbalized undestanding

## 2012-03-04 NOTE — Progress Notes (Signed)
Patient ID: [name], female   DOB: 14-Aug-1926, 78 y.o.   MRN: [id number]  Subjective:   Patient ID: [name] female   DOB: 1925-11-12 78 y.o.   MRN: [id number]  HPI:  [name] is a 78 y.o. with a past medical history as outlined below, who presents for followup visit.  Patient was seen at clinic by me at 02/26/12 due to leg edema. Her pro BNP was 3170 at 02/18/12. She was taking Lasix 20 mg daily, lisinopril and metoprolol. She had severe bilateral leg edema, worse on the left side. She also reported having mild shortness of breath which was near her baseline. She did not have cough, chest pain, or palpitation. Her lung auscultation shows expiratory wheezing. Patient was treated as mild congestive heart failure by increasing Lasix dose from 20 mg daily to 40 mg daily. Patient reports that she was urinating a lot after she started taking Lasix. Her blood pressure decreased from 162/79 to 127/86, but her body weight increased from 196.8 to 199.8. She still has similar level of leg edema, worse on the left side. She reports that she has occasional PND. She does not have cough, chest pain or palpitation. She has mild shortness of breath and wheezing. Patient does not have a fever, chills, any pain in her calf areas.   Denies abdominal pain,diarrhea, constipation, dysuria, urgency, frequency or hematuria.    Past Medical History  Diagnosis Date  . Hypertension   . Diabetes mellitus   . Asthma   . Osteoarthritis     left shoulder  . Pseudogout   . Hypokalemia     2/2 HCTZ on chronic K oral supp  . 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 Grave's disease     s/p thyroidectomy  . Glaucoma   . Hypercholesteremia   . Venous insufficiency   . Cardiomyopathy     Dilated CM, EF 50-55% 2003  . Osteopenia     DEXA 2004  . Lipoma     left inner thigh  . Cataract     left eye  . VASCULITIS 07/27/2006  . CHF (congestive heart failure)    No current  facility-administered medications for this visit.   No current outpatient prescriptions on file.   Facility-Administered Medications Ordered in Other Visits  Medication Dose Route Frequency Provider Last Rate Last Dose  . acetaminophen (TYLENOL) tablet 650 mg  650 mg Oral Q4H PRN Leodis Sias, MD      . albuterol (PROVENTIL) (5 MG/ML) 0.5% nebulizer solution 2.5 mg  2.5 mg Nebulization Q4H PRN Leodis Sias, MD       And  . ipratropium (ATROVENT) nebulizer solution 0.5 mg  0.5 mg Nebulization Q4H PRN Leodis Sias, MD      . aspirin chewable tablet 81 mg  81 mg Oral Daily Leodis Sias, MD      . brimonidine (ALPHAGAN) 0.2 % ophthalmic solution 1 drop  1 drop Left Eye Q12H Leodis Sias, MD      . dorzolamide (TRUSOPT) 2 % ophthalmic solution 1 drop  1 drop Left Eye BID Leodis Sias, MD      . Fluticasone-Salmeterol (ADVAIR) 100-50 MCG/DOSE inhaler 1 puff  1 puff Inhalation BID Leodis Sias, MD      . furosemide (LASIX) injection 40 mg  40 mg Intravenous Daily Leodis Sias, MD      . heparin injection 5,000 Units  5,000 Units Subcutaneous Q8H Leodis Sias, MD      .  insulin aspart (novoLOG) injection 0-9 Units  0-9 Units Subcutaneous TID WC Leodis Sias, MD      . lisinopril (PRINIVIL,ZESTRIL) tablet 20 mg  20 mg Oral Daily Leodis Sias, MD      . metoprolol succinate (TOPROL-XL) 24 hr tablet 25 mg  25 mg Oral Daily Leodis Sias, MD      . ondansetron [facility]) injection 4 mg  4 mg Intravenous Q6H PRN Leodis Sias, MD      . sodium chloride 0.9 % injection 3 mL  3 mL Intravenous Q12H Leodis Sias, MD      . sodium chloride 0.9 % injection 3 mL  3 mL Intravenous PRN Leodis Sias, MD      . DISCONTD: aspirin tablet 81 mg  81 mg Oral Daily Leodis Sias, MD      . DISCONTD: brimonidine (ALPHAGAN P) 0.1 % ophthalmic solution 1 drop  1 drop Left Eye Q12H Leodis Sias, MD       No  family history on file. History   Social History  . Marital Status: Widowed    Spouse Name: N/A    Number of Children: N/A  . Years of Education: N/A   Social History Main Topics  . Smoking status: Never Smoker   . Smokeless tobacco: Never Used  . Alcohol Use: No  . Drug Use: No  . Sexually Active: No   Other Topics Concern  . None   Social History Narrative   Lives in house, with family just next door.never smokerno alcohol.   General: no fevers, chills, no changes in body weight, no changes in appetite Skin: no rash HEENT: no blurry vision, hearing changes or sore throat Pulm: no coughing, has wheezing and mild SOB CV: no chest pain, palpitations, shortness of breath Abd: no nausea/vomiting, abdominal pain, diarrhea/constipation GU: no dysuria, hematuria, polyuria Ext: has bilateral leg edema. no arthralgias, myalgias Neuro: no weakness, numbness, or tingling    Objective:  Physical Exam: Filed Vitals:   03/04/12 0858  BP: 127/86  Pulse: 100  Temp: 98.9 F (37.2 C)  TempSrc: Oral  Height: 5\' 3"  (1.6 m)  Weight: 199 lb 12.8 oz (90.629 kg)  SpO2: 93%   General: resting in bed, not in acute distress  HEENT: PERRL, EOMI, no scleral icterus. Bilateral external jugular vein distension.  Cardiac: S1/S2, RRR, No murmurs, gallops or rubs  Pulm: Good air movement bilaterally, there is expiratory wheezing bilaterally. No rales, rhonchi or rubs.  Abd: Soft, nondistended, nontender, no rebound pain, no organomegaly, BS present  Ext: 2+DP/PT pulse bilaterally. bilateral pitting edema in legs, 2+ on the right and 3+ on the left side.  Musculoskeletal: No joint deformities, erythema, or stiffness, ROM full and no nontender  Skin: no rashes. No skin bruise.  Neuro: alert and oriented X3, cranial nerves II-XII grossly intact, muscle strength 5/5 in all extremeties, sensation to light touch intact.  Psych.: patient is not psychotic, no suicidal or hemocidal  ideation.    Assessment & Plan:

## 2012-03-05 ENCOUNTER — Other Ambulatory Visit: Payer: Self-pay

## 2012-03-05 ENCOUNTER — Encounter (HOSPITAL_COMMUNITY): Payer: Self-pay | Admitting: Internal Medicine

## 2012-03-05 ENCOUNTER — Inpatient Hospital Stay (HOSPITAL_COMMUNITY): Payer: PRIVATE HEALTH INSURANCE

## 2012-03-05 DIAGNOSIS — M7989 Other specified soft tissue disorders: Secondary | ICD-10-CM

## 2012-03-05 DIAGNOSIS — I509 Heart failure, unspecified: Secondary | ICD-10-CM

## 2012-03-05 DIAGNOSIS — I5023 Acute on chronic systolic (congestive) heart failure: Principal | ICD-10-CM

## 2012-03-05 DIAGNOSIS — R109 Unspecified abdominal pain: Secondary | ICD-10-CM

## 2012-03-05 DIAGNOSIS — R110 Nausea: Secondary | ICD-10-CM

## 2012-03-05 LAB — BASIC METABOLIC PANEL
CO2: 29 mEq/L (ref 19–32)
Chloride: 104 mEq/L (ref 96–112)
Creatinine, Ser: 0.75 mg/dL (ref 0.50–1.10)
GFR calc Af Amer: 88 mL/min — ABNORMAL LOW (ref >90)
GFR calc non Af Amer: 76 mL/min — ABNORMAL LOW (ref >90)
Potassium: 3.3 mEq/L — ABNORMAL LOW (ref 3.5–5.1)

## 2012-03-05 LAB — GLUCOSE, CAPILLARY
Glucose-Capillary: 126 mg/dL — ABNORMAL HIGH (ref 70–99)
Glucose-Capillary: 190 mg/dL — ABNORMAL HIGH (ref 70–99)
Glucose-Capillary: 115 mg/dL — ABNORMAL HIGH (ref 70–99)

## 2012-03-05 LAB — CARDIAC PANEL(CRET KIN+CKTOT+MB+TROPI)
Relative Index: INVALID (ref 0.0–2.5)
Troponin I: <0.3 ng/mL (ref <0.30)

## 2012-03-05 LAB — POTASSIUM: Potassium: 3.7 mEq/L (ref 3.5–5.1)

## 2012-03-05 MED ORDER — POTASSIUM CHLORIDE CRYS ER 20 MEQ PO TBCR
40.0000 meq | EXTENDED_RELEASE_TABLET | ORAL | Status: AC
  Administered 2012-03-05: 40 meq via ORAL
  Filled 2012-03-05 (×2): qty 2

## 2012-03-05 MED ORDER — FLUTICASONE-SALMETEROL 250-50 MCG/DOSE IN AEPB
1.0000 | INHALATION_SPRAY | Freq: Two times a day (BID) | RESPIRATORY_TRACT | Status: DC
  Administered 2012-03-06 – 2012-03-12 (×13): 1 via RESPIRATORY_TRACT
  Filled 2012-03-05 (×2): qty 14

## 2012-03-05 MED ORDER — POTASSIUM CHLORIDE CRYS ER 20 MEQ PO TBCR
40.0000 meq | EXTENDED_RELEASE_TABLET | Freq: Once | ORAL | Status: AC
  Administered 2012-03-05: 40 meq via ORAL

## 2012-03-05 NOTE — Progress Notes (Signed)
Notified that patient had a rhythm change from atrial fib to sinus rhythm.  EKG done to confirm, showed sinus rhythm with PVC's. Resident on call notified of change, no new orders needed at this time.  Will continue to monitor.  Troy Sine

## 2012-03-05 NOTE — Progress Notes (Signed)
Resident Addendum to Medical Student Note   I have seen and examined the patient, and reviewed the daily progress note by Eather Colas, MS IV and discussed the care of the patient with them. See below for documentation of my findings, assessment, and plans.   Subjective:    Currently, the patient is feeling well without chest pain, difficulty breathing, shortness of breath. States she continues to have occasional palpitations without associated shortness of breath or difficulty breathing. She also had episode of nausea this AM which has since resolved. LE edema improving per patient.  Interval Events: None.   Objective:    VS: Reviewed as documented in electronic medical record  Meds: Reviewed as documented in electronic medical record  Labs: Reviewed as documented in electronic medical record  Imaging: Reviewed as documented in electronic medical record   Physical Exam: General: Vital signs reviewed and noted. Well-developed, well-nourished, in no acute distress; alert, appropriate and cooperative throughout examination.  Lungs:  Normal respiratory effort. Mild basilar crackles.   Heart: Irregular rhythm, regular rate. S1 and S2 normal without gallop, rubs. (+) murmur.  Abdomen:  BS normoactive. Soft, Nondistended, non-tender.  No masses or organomegaly.  Extremities: 2-3+ pretibial edema, L > R. Ted hose in place.    Assessment/ Plan:    Pt is a 78 y.o. yo female with a PMHx of non-insulin dependent DMII, uncontrolled (A1c 7.6), multifocal atrial tachycardia, diastolic CHF, and remove history of systolic CHF (LV EF 25-35% in 4034) of unclear significance  who was admitted on 03/04/2012 with symptoms of worsening lower extremity edema. 2D echocardiogram on admission indicates systolic dysfunction with EF of 20-25%. Etiology of the cardiomyopathy remains unclear, and is yet to be elucidated.  1) New systolic cardiomyopathy - patient is noted to have progressively worsening  lower extremity edema, shortness of breath on admission, with 2D echocardiogram confirming LV EF 20-25% which is severely reduced from last echocardiogram in 2003 showing EF of 50-55%. It does not appear that this cardiomyopathy has previously been worked up. Acute ischemia contributing towards systolic dysfunction has been ruled out with negative cardiac enzymes and no new EKG changes.  - Will consult cardiology to aide in continued workup of her cardiomyopathy and optimization of her medical regimen. - Appreciate cardiology assistance and input in the management of our patient.  - Continue Lisinopril and BB for now.  2) Bilateral leg swelling - at least partially contributed by #1 in addition to venous insufficiency. Despite asymmetrical edema noted, LE dopplers are negative for DVT, but do show ruptured baker's cyst on left, likely contributing towards asymmetry. No indication of nephrotic syndrome, however, given hx of DMII and HTN, will also evaluate for nephrotic syndrome. Last microalb/ cr was within normal limits. - Continue TED hose. - Check urinalysis to evaluate for proteinuria  3) Diabetes Mellitus Type II, uncontrolled, HA1c 7.6 - noninsulin dependent. - Continue SSI for now.  - If no cardiac intervention needed, will resume Metformin soon. - Given age, can accept less tight blood sugar control.   4) Hypertension - Well controlled on current Lisinopril and Metoprolol. - Continue current.    5) Multifocal atrial tachycardia: On admission her pulse was irregular and she was mildly tachycardic.  - We will continue to monitor with diuresis  6) History of asthma - on Advair as an outpatient and uses her albuterol several times a week. She has no PFTs that I see.  - Will escalate dose of Advair given frequency of albuterol usage. -  While in the hospital we will continue Duonebs PRN.   7) VTE: Heparin   Length of Stay: 1   Signed: Johnette Abraham, Roma Schanz, Internal  Medicine Resident Pager: [phone] (7AM-5PM) 03/05/2012, 2:02 PM

## 2012-03-05 NOTE — Consult Note (Signed)
CARDIOLOGY CONSULT NOTE   Patient ID: [name] MRN: [id number] DOB/AGE: 1926-02-03 78 y.o.  Admit date: 03/04/2012  Primary Physician   Wendy Mage, MD Primary Cardiologist   New Reason for Consultation   CHF, LVD  MVH:QIONG [name] is an 78 y.o. female h/o CHF, DCM, HTN, DM , asthma,Grave's disease s/p thyroidectomy who cardiology has been consulted for evaluation of CHF exacerbation. She was admitted through the internal medicine service for bilateral leg edema, weight gain and shortness of breath. According to patient she been going to the  "clinic downstairs" (the internal medicine outpatient clinic) for swelling of her legs which has been going on for about a week.  She noticed that the swelling was not resolving. It was associated with SOB and DOE so saw the IM MD yesterday and was admitted to the hospital for further work up. She sleeps on one pillow at home but occasionally wakes up in the middle of the night, short of breath.Her weight is down from 198Ibs (03/04/12) to 188Ibs since admission. Currently, she complains of orthopnea, DOE, and she is still SOB at rest. She attributes part of this to her asthma. She also complains of generalized weakness, nausea and diarrhea x1. She denies chest pain, palpitations or dizziness.  Past Medical History  Diagnosis Date  . Hypertension   . Diabetes mellitus   . Asthma   . Osteoarthritis     left shoulder  . Pseudogout   . Hypokalemia     2/2 HCTZ on chronic K oral supp  . 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 Grave's disease     s/p thyroidectomy  . Glaucoma   . Hypercholesteremia   . Venous insufficiency   . Cardiomyopathy     Dilated CM, EF 50-55% 2003  . Osteopenia     DEXA 2004  . Lipoma     left inner thigh  . Cataract     left eye  . VASCULITIS 07/27/2006  . CHF (congestive heart failure)      Past Surgical History  Procedure Date  . Abdominal hysterectomy   . Salpingoophorectomy   .  Thyroidectomy   . Right hip cemented hemiarthroplasty 10/2010    for Right hip fracture, femoral neck - by Wendy Post, MD    No Known Allergies  I have reviewed the patient's current medications    . antiseptic oral rinse  15 mL Mouth Rinse BID  . aspirin  81 mg Oral Daily  . brimonidine  1 drop Left Eye Q12H  . dorzolamide  1 drop Left Eye BID  . Fluticasone-Salmeterol  1 puff Inhalation BID  . furosemide  40 mg Intravenous BID  . heparin  5,000 Units Subcutaneous Q8H  . insulin aspart  0-9 Units Subcutaneous TID WC  . lisinopril  20 mg Oral Daily  . metoprolol succinate  25 mg Oral Daily  . potassium chloride  40 mEq Oral Q4H  . sodium chloride  3 mL Intravenous Q12H  . DISCONTD: furosemide  40 mg Intravenous Daily     acetaminophen, albuterol, ipratropium, ondansetron (ZOFRAN) IV, sodium chloride  Medication Sig  albuterol (PROVENTIL HFA;VENTOLIN HFA) 108 (90 BASE) MCG/ACT inhaler Inhale 1-2 puffs into the lungs every 6 (six) hours as needed. Shortness of breath  aspirin 81 MG tablet Take 81 mg by mouth daily.    atropine 1 % ophthalmic solution Place 1 drop into the left eye every other day.  brimonidine (ALPHAGAN P) 0.1 %  SOLN Place 1 drop into the left eye every 12 (twelve) hours.   dorzolamide (TRUSOPT) 2 % ophthalmic solution Place 1 drop into the left eye 2 (two) times daily.   Fluticasone-Salmeterol (ADVAIR DISKUS) 100-50 MCG/DOSE AEPB Inhale 1 puff into the lungs 2 (two) times daily.   furosemide (LASIX) 40 MG tablet Take 40 mg by mouth daily.  lisinopril (PRINIVIL,ZESTRIL) 20 MG tablet Take 20 mg by mouth daily.  metFORMIN (GLUCOPHAGE) 1000 MG tablet Take 1,000 mg by mouth 2 (two) times daily with a meal.  metoprolol succinate (TOPROL-XL) 25 MG 24 hr tablet Take 25 mg by mouth daily.  potassium chloride (MICRO-K) 10 MEQ CR capsule Take 10 mEq by mouth 2 (two) times daily.  prednisoLONE acetate (PRED FORTE) 1 % ophthalmic suspension Place 1 drop into the left  eye 2 (two) times daily.   History   Social History  . Marital Status: Widowed    Spouse Name: N/A    Number of Children: N/A  . Years of Education: N/A   Occupational History  . Retired    Social History Main Topics  . Smoking status: Never Smoker   . Smokeless tobacco: Never Used  . Alcohol Use: No  . Drug Use: No  . Sexually Active: No   Other Topics Concern  . Not on file   Social History Narrative   Lives in house, with family just next door. Never smoker, no alcohol.   Family Status  Relation Status Death Age  . Mother Deceased   . Father Deceased       ROS:  + Nausea, +generalized weakness, +DOE. Full 14 point review of systems is negative except as stated in the history of present illness.                                                                                                           Physical Exam: Blood pressure 119/76, pulse 83, temperature 98.4 F (36.9 C), temperature source Oral, resp. rate 19, height 5\' 7"  (1.702 m), weight 188 lb 15 oz (85.7 kg), SpO2 100.00%.  General: Well developed, well nourished, female mild distress from SOB. Head: Eyes PERRLA, No xanthomas.   Normocephalic and atraumatic, oropharynx without edema or exudate. Dentition - poor Lungs: wheezing, rales no rhonchi. Heart: HRRR S1 S2, no rub/gallop, no significant murmur, pulses are 2+ radials, pedal pulses are decreased but capillary refill is WNL.  Neck: No carotid bruits. No lymphadenopathy.  +JVD at 10 cm. Abdomen: Bowel sounds present, abdomen soft and non-tender . Msk:   + weakness, no spine or cva tenderness, no joint deformities or effusions. Extremities: 4+ pitting edema left leg, 3+ pitting edema right leg.  Neuro: Alert and oriented X 3. No focal deficits noted. Psych:  Good affect, responds appropriately Skin: No rashes or lesions noted.  Labs:   Lab Results  Component Value Date   WBC 3.0* 03/04/2012   HGB 12.2 03/04/2012   HCT 37.2 03/04/2012   MCV 93.5  03/04/2012   PLT 233 03/04/2012  Lab 03/05/12 0437 03/04/12 1300  NA 141 --  K 3.3* --  CL 104 --  CO2 29 --  BUN 13 --  CREATININE 0.75 --  CALCIUM 8.7 --  PROT -- 6.9  BILITOT -- 0.8  ALKPHOS -- 75  ALT -- 7  AST -- 18  GLUCOSE 139* --   Magnesium  Date Value Range Status  03/04/2012 1.9  1.5 - 2.5 mg/dL Final    Basename 57/84/69 0437 03/04/12 1957 03/04/12 1300  CKTOTAL 89 102 115  CKMB 3.4 3.8 3.9  TROPONINI <0.30 <0.30 <0.30   Pro B Natriuretic peptide (BNP)  Date/Time Value Range Status  03/04/2012  1:00 PM 3468.0* 0 - 450 pg/mL Final  02/18/2012 10:50 AM 3170.0* 0 - 450 pg/mL Final   TSH  Date/Time Value Range Status  03/04/2012  1:00 PM 1.627  0.350 - 4.500 uIU/mL Final    Echo: 03/04/2012 Study Conclusions - Left ventricle: The cavity size was normal. [name] thickness was increased in a pattern of moderate LVH. Systolic function was severely reduced. The estimated ejection fraction was in the range of 20% to 25%. there were no regional [name] motion abnormalities. - Aortic valve: Mild regurgitation. - Mitral valve: Moderate regurgitation. - Right atrium: The atrium was moderately dilated. - Tricuspid valve: Moderate regurgitation. - Pulmonary arteries: Systolic pressure was moderately increased. PA peak pressure: 56mm Hg (S). - Pericardium, extracardiac: A small to moderate pericardial effusion was identified.  06/01/2002 SUMMARY - Overall left ventricular systolic function was at the lower limits of normal. Left ventricular ejection fraction was estimated , range being 50 % to 55 %. Although no diagnostic left ventricular regional [name] motion abnormality was identified, this possibility cannot be completely excluded on the basis of this study. Left ventricular [name] thickness was increased. Doppler parameters were consistent with abnormal left ventricular relaxation. - Aortic valve thickness was mildly increased. There was mild aortic valvular  regurgitation. - There was mild mitral annular calcification. There was mild mitral valvular regurgitation. - The left atrium was dilated. The possibility of a patent foramen ovale cannot be excluded on the basis of this study.  February 2001  2-D echocardiogram revealed mild left ventricular dilatation with septal akinesis and arterial  [name] akinesis. Left ventricular ejection fraction was decreased to 25 to 35%, and  There was moderate decrease in right ventricular ejection fraction. There was a  small pericardial effusion and a moderate to severe tricuspid regurgitation as well  as moderate pulmonary hypertension.  ECG: 04-Mar-2012 08:41:20  Sinus rhythm with Premature atrial complexes with Abberant conduction Left axis deviation Septal infarct , age undetermined Possible Lateral infarct , age undetermined Inferior infarct , age undetermined Abnormal ECG Vent. rate 88 BPM PR interval 174 ms QRS duration 102 ms QT/QTc 382/462 ms P-R-T axes 74 -59 87   Radiology:  Dg Chest 2 View 03/05/2012  *RADIOLOGY REPORT*  Clinical Data: Heart failure  CHEST - 2 VIEW  Comparison: 02/18/2012  Findings: Enlargement of the cardiac silhouette appears the same. The aorta shows calcification and unfolding.  There are small bilateral pleural effusions with basilar atelectasis.  There is venous hypertension but no frank edema.  IMPRESSION: Cardiomegaly, venous hypertension, small effusions and mild basilar atelectasis. Since the comparison study of 07/03, there is more pleural fluid.  Original Report Authenticated By: Thomasenia Sales, M.D.   ASSESSMENT AND PLAN:   1. Acute on chronic systolic heart failure- LV function has markedly reduced from 50-55% to 20-25%. First set of enzymes  negative, ECG non acute. ProBnp (drawn yesterday) was elevated. Although she has lost 10Ibs since yesterday, she is still volume overloaded, continues to have DOE and SOB. Continue increased dose of Lasix, continue BB, ACEI,  follow daily weights, and I &Os. CEs were negative. Pt has never had an ischemic eval. MD advise on further eval.   2. Atrial tachycardia- resolved, continue BB.   Other issues per IM 1. Asthma- She has a history of asthma which could be contributing to her SOB. Consider PRN meds (albuterol MDI at home) in addition to current regimen. Will leave to primary MD 2.Bakers cyst - seen on [place] 3. DM 4. Hypokalemia 5. leucopenia 6. Diarrhea 7. HTN- stable. 8. Hypercholesterolemia- Her last cholesterol level was in 2012 which was abnormal. Will recheck levels and treat based on results.  Signed: Theodore Demark 03/05/2012, 3:10 PM Co-Sign MD I have taken a history, reviewed medications, allergies, PMH, SH, FH, and reviewed ROS and examined the patient.  I agree with the assessment and plan . I cannot explain the significant drop in EF from previous echocardiogram. At this time, she presents with right ventricular heart failure, secondary to left ventricular heart failure and asthma. Diurese for comfort. Give educational HF materials for self-management at home. Family also needs to be instructed.   Kaelon Weekes C. Daleen Squibb, MD, [facility] Pager:  [phone]

## 2012-03-05 NOTE — Progress Notes (Signed)
VASCULAR LAB PRELIMINARY  PRELIMINARY  PRELIMINARY  PRELIMINARY  Bilateral lower extremity venous duplex completed.    Preliminary report:  Right - No evidence of DVT, superficial thrombosis, or Baker's cyst. Left - No evidence of DVT or superficial thrombosis. There is an area of mixed echoes coursing approximately 4 cm from the popliteal fossa into the calf which is consistent with a ruptured Baker's cyst.  Jared Whorley, RVS 03/05/2012, 9:49 AM

## 2012-03-05 NOTE — H&P (Signed)
Internal Medicine Teaching Service Attending Note Date: 03/05/2012  Patient name: [name]  Medical record number: [id number]  Date of birth: 05/09/1926   I have seen and evaluated Ferne Coe and discussed their care with the Residency Team. Please see Dr Donnelly Stager H&P for full details. Briefly, Ms Nishida has had 2 months of increasing B leg edema & weight gain despite escalation of her home lasix dose. She has also noticed increased DOE these summer months. She notices this when she goes to the grocery store. She contributes this to not seeing her pul MD in over one year.   She lives alone although her daughter lives nearby and her son helps her with errands. She is independent in her ADL's inc cooking, cleaning, and hygiene.   Her PMHx, meds, allergies, soc hx, fam hx were reviewed.  On exam, she was a documented 20 lb weight gain since 02/10/2012. Her BP is now 119/76, O2 sat 100%, HR 83.  She is able to speak in full sentences.  H regular rhythm freq interrupted by an irr irr rhythm, systolic murmur. L good air movement with wheezing B. No crackles. Ext stocks on but pitting edema to knees B and above knee on R post thigh   Labs and imagine reviewed  Assessment and Plan: I agree with the formulated Assessment and Plan with the following changes:  1. New systolic cardiomyopathy - EF was found to be 20-25% with no wall motion abnl. She had mod LVH. Her last ECHo 2003 showed na EF of 55% although the 2001 ECHO also showed an EF of 25-35%. She didn't have an ischemic W/U at that time. She is on an ACEI and BB. She is diuresing well on IV lasix and her creatinine remains stable. Cards has been asked to eval pt - ischemic W/U on an 78 yo without wall motion abnl may not be indicated but will await their recs. We are cycling CE and EKG's. TSH is nl.   2. LE edema - her new cardiomyopathy could be contributing but also has d/o venous insuff. She is wearing stockings. Lasix might help a bit.  Will check UA to R/O nephrotic syndrome but unlikely. Dopplers negative for DVT but showed ruptured baker's cyst that could contribute to asymmetry.   3. Multifocal atrial tachy - monitor on tele.  4. DM - Hold metformin as we need to diurese her and use SSI.  5. HTN - now normotensive.  6. Dispo - need to improve sxs and try to est euvolemia. Likely D/C 2 days.  Burns Spain, MD 7/19/20132:49 PM

## 2012-03-05 NOTE — Progress Notes (Signed)
Patient ID: [name], female   DOB: [birth year], 78 y.o.   MRN: [id number] Medical Student Daily Progress Note  Subjective: Patient reported one instance of nausea but that it went away. Otherwise she's doing well. She's urinating a lot.  Interval Events: No acute events overnight. Objective: Vital signs in last 24 hours: Filed Vitals:   03/04/12 2203 03/05/12 0202 03/05/12 0514 03/05/12 1043  BP: 117/76 109/57 139/89 125/84  Pulse: 84 78 85 76  Temp: 97.5 F (36.4 C) 97.6 F (36.4 C) 97.5 F (36.4 C)   TempSrc: Oral Oral Oral   Resp: 20 22 20    Height:      Weight:   85.7 kg (188 lb 15 oz)   SpO2: 91% 91% 98% 100%   Weight change:   Intake/Output Summary (Last 24 hours) at 03/05/12 1135 Last data filed at 03/05/12 1104  Gross per 24 hour  Intake   1163 ml  Output   1550 ml  Net   -387 ml   Physical Exam: BP 125/84  Pulse 76  Temp 97.5 F (36.4 C) (Oral)  Resp 20  Ht 5\' 7"  (1.702 m)  Wt 85.7 kg (188 lb 15 oz)  BMI 29.59 kg/m2  SpO2 100%  General Appearance:    Alert, cooperative, no distress, appears stated age  Back:     Symmetric, no curvature, ROM normal, no CVA tenderness  Lungs:     Wheezes bilaterally  Chest Wall:    No tenderness or deformity   Heart:    Irregular rhythm, PVC's  Abdomen:     Soft, non-tender, bowel sounds active,    no masses, no organomegaly   Lab Results: Basic Metabolic Panel:  Lab 03/05/12 4098 03/04/12 1300  NA 141 140  K 3.3* 3.2*  CL 104 102  CO2 29 26  GLUCOSE 139* 187*  BUN 13 14  CREATININE 0.75 0.74  CALCIUM 8.7 9.2  MG -- 1.9  PHOS -- --   Liver Function Tests:  Lab 03/04/12 1300  AST 18  ALT 7  ALKPHOS 75  BILITOT 0.8  PROT 6.9  ALBUMIN 3.7   No results found for this basename: LIPASE:2,AMYLASE:2 in the last 168 hours No results found for this basename: AMMONIA:2 in the last 168 hours CBC:  Lab 03/04/12 1300  WBC 3.0*  NEUTROABS 1.4*  HGB 12.2  HCT 37.2  MCV 93.5  PLT 233   Cardiac  Enzymes:  Lab 03/05/12 0437 03/04/12 1957 03/04/12 1300  CKTOTAL 89 102 115  CKMB 3.4 3.8 3.9  CKMBINDEX -- -- --  TROPONINI <0.30 <0.30 <0.30   BNP:  Lab 03/04/12 1300  PROBNP 3468.0*   D-Dimer: No results found for this basename: DDIMER:2 in the last 168 hours CBG:  Lab 03/05/12 1106 03/05/12 0617 03/04/12 1712 03/04/12 0904  GLUCAP 190* 126* 146* 148*   Hemoglobin A1C: No results found for this basename: HGBA1C in the last 168 hours Fasting Lipid Panel: No results found for this basename: CHOL,HDL,LDLCALC,TRIG,CHOLHDL,LDLDIRECT in the last 119 hours Thyroid Function Tests:  Lab 03/04/12 1300  TSH 1.627  T4TOTAL --  FREET4 1.33  T3FREE --  THYROIDAB --   Coagulation: No results found for this basename: LABPROT:4,INR:4 in the last 168 hours Anemia Panel: No results found for this basename: VITAMINB12,FOLATE,FERRITIN,TIBC,IRON,RETICCTPCT in the last 168 hours Urine Drug Screen: Drugs of Abuse  No results found for this basename: labopia, cocainscrnur, labbenz, amphetmu, thcu, labbarb    Alcohol Level: No results found for this basename:  ETH:2 in the last 168 hours Urinalysis: No results found for this basename: COLORURINE:2,APPERANCEUR:2,LABSPEC:2,PHURINE:2,GLUCOSEU:2,HGBUR:2,BILIRUBINUR:2,KETONESUR:2,PROTEINUR:2,UROBILINOGEN:2,NITRITE:2,LEUKOCYTESUR:2 in the last 168 hours Misc. Labs:   Micro Results: No results found for this or any previous visit (from the past 240 hour(s)). Studies/Results: Dg Chest 2 View  03/05/2012  *RADIOLOGY REPORT*  Clinical Data: Heart failure  CHEST - 2 VIEW  Comparison: 02/18/2012  Findings: Enlargement of the cardiac silhouette appears the same. The aorta shows calcification and unfolding.  There are small bilateral pleural effusions with basilar atelectasis.  There is venous hypertension but no frank edema.  IMPRESSION: Cardiomegaly, venous hypertension, small effusions and mild basilar atelectasis. Since the comparison study of  07/03, there is more pleural fluid.  Original Report Authenticated By: Thomasenia Sales, M.D.   Medications: I have reviewed the patient's current medications. Scheduled Meds:   . antiseptic oral rinse  15 mL Mouth Rinse BID  . aspirin  81 mg Oral Daily  . brimonidine  1 drop Left Eye Q12H  . dorzolamide  1 drop Left Eye BID  . Fluticasone-Salmeterol  1 puff Inhalation BID  . furosemide  40 mg Intravenous BID  . heparin  5,000 Units Subcutaneous Q8H  . insulin aspart  0-9 Units Subcutaneous TID WC  . lisinopril  20 mg Oral Daily  . metoprolol succinate  25 mg Oral Daily  . potassium chloride  40 mEq Oral Q4H  . sodium chloride  3 mL Intravenous Q12H  . DISCONTD: aspirin  81 mg Oral Daily  . DISCONTD: brimonidine  1 drop Left Eye Q12H  . DISCONTD: furosemide  40 mg Intravenous Daily   Continuous Infusions:  PRN Meds:.acetaminophen, albuterol, ipratropium, ondansetron (ZOFRAN) IV, sodium chloride Assessment/Plan: 1. CHF exacerbation- Initial differential in patient with edema and history of CHF is ischemia, pneumonia, or over hydration. CE in the ED were negative and chest x-ray was unremarkable. Patient's EF is 25% on 2d echo. This is likely a CHF exacerbation, Lasix 40 mg BID was started and since yesterday at 4 pm she is negative 1.25 liters. Creatinine is normal. Most of the edema is in her legs and so hose will be continued. Cardiology will be consulted for recs and possible f/u on discharge. - on tele -Continue Lasix 40mg  BID -Continue TED hose -Consult Cards  2. DM II- On SSI, will need adjustments as an outpatient.  3. Hypertension- On home doses of lisinopril and metoprolol. Blood pressure has been stable. -Continue lisinopril and metoprolol.  4. Irregular heart rhythm- On exam, heart rhythm is irregular. On EKG there's two distinct p waves. Will need to be follow up as an outpatient. -On aspirin  5. Shortness of breath- Awaiting venous dopplers giving presentation of left  swelling greater than right. Patient has history of decreased venous drainage on left so this is likely the cause. Patient has an unclear history of "Asthma". On exam today she had wheezes and so patient was encouraged to use PRN medication albuterol. Will need outpatient PFT's and follow up for optimal management of her possible asthma. - PRN advair - Venous dopplers - Outpatient PFT's and follow up  6. Dispo- Patient lives at home alone and has a history of falls. She walks with a cain but unclear on her ability to get around. Will appreciate PT/OT recs. -PT/OT consult   LOS: 1 day   This is a Psychologist, occupational Note.  The care of the patient was discussed with Dr. Saralyn Pilar and the assessment and plan formulated with their assistance.  Please  see their attached note for official documentation of the daily encounter.  Eather Colas T 03/05/2012, 11:35 AM

## 2012-03-05 NOTE — Progress Notes (Signed)
1825  Pt complained of shortness of breath ,with expiratory wheezing on auscultation . Respiratory treatment provided as charted . Respiratory therapist at bedside . Kept hob elevated   1845  Pt breathing better no wheezing . Family in attendance.

## 2012-03-06 ENCOUNTER — Encounter (HOSPITAL_COMMUNITY): Payer: Self-pay | Admitting: Internal Medicine

## 2012-03-06 DIAGNOSIS — I471 Supraventricular tachycardia: Secondary | ICD-10-CM

## 2012-03-06 DIAGNOSIS — I498 Other specified cardiac arrhythmias: Secondary | ICD-10-CM

## 2012-03-06 LAB — GLUCOSE, CAPILLARY
Glucose-Capillary: 161 mg/dL — ABNORMAL HIGH (ref 70–99)
Glucose-Capillary: 163 mg/dL — ABNORMAL HIGH (ref 70–99)

## 2012-03-06 LAB — BASIC METABOLIC PANEL
BUN: 18 mg/dL (ref 6–23)
CO2: 27 mEq/L (ref 19–32)
CO2: 25 mEq/L (ref 19–32)
Calcium: 9.3 mg/dL (ref 8.4–10.5)
Calcium: 9.1 mg/dL (ref 8.4–10.5)
Creatinine, Ser: 0.83 mg/dL (ref 0.50–1.10)
GFR calc Af Amer: 73 mL/min — ABNORMAL LOW (ref >90)
GFR calc non Af Amer: 63 mL/min — ABNORMAL LOW (ref >90)
GFR calc non Af Amer: 65 mL/min — ABNORMAL LOW (ref >90)
Glucose, Bld: 169 mg/dL — ABNORMAL HIGH (ref 70–99)
Potassium: 4 mEq/L (ref 3.5–5.1)
Sodium: 139 mEq/L (ref 135–145)
Sodium: 137 mEq/L (ref 135–145)

## 2012-03-06 LAB — URINALYSIS, MICROSCOPIC ONLY
Ketones, ur: 15 mg/dL — AB
Nitrite: NEGATIVE
Protein, ur: 100 mg/dL — AB
Specific Gravity, Urine: 1.029 (ref 1.005–1.030)
Urobilinogen, UA: 1 mg/dL (ref 0.0–1.0)
pH: 5.5 (ref 5.0–8.0)

## 2012-03-06 MED ORDER — FUROSEMIDE 40 MG PO TABS
40.0000 mg | ORAL_TABLET | Freq: Two times a day (BID) | ORAL | Status: DC
  Filled 2012-03-06 (×3): qty 1

## 2012-03-06 MED ORDER — ALBUTEROL SULFATE (5 MG/ML) 0.5% IN NEBU: 2.5000 mg | INHALATION_SOLUTION | Freq: Four times a day (QID) | RESPIRATORY_TRACT | Status: DC

## 2012-03-06 MED ORDER — IPRATROPIUM BROMIDE 0.02 % IN SOLN
0.5000 mg | RESPIRATORY_TRACT | Status: DC
  Administered 2012-03-06: 0.5 mg via RESPIRATORY_TRACT
  Filled 2012-03-06: qty 2.5

## 2012-03-06 MED ORDER — ALBUTEROL SULFATE (5 MG/ML) 0.5% IN NEBU
2.5000 mg | INHALATION_SOLUTION | RESPIRATORY_TRACT | Status: DC
  Administered 2012-03-06: 2.5 mg via RESPIRATORY_TRACT
  Filled 2012-03-06: qty 0.5

## 2012-03-06 MED ORDER — ALBUTEROL SULFATE (5 MG/ML) 0.5% IN NEBU
2.5000 mg | INHALATION_SOLUTION | Freq: Four times a day (QID) | RESPIRATORY_TRACT | Status: DC
  Filled 2012-03-06: qty 0.5

## 2012-03-06 MED ORDER — FUROSEMIDE 10 MG/ML IJ SOLN
60.0000 mg | Freq: Two times a day (BID) | INTRAMUSCULAR | Status: DC
  Administered 2012-03-06 (×2): 60 mg via INTRAVENOUS
  Filled 2012-03-06 (×5): qty 6

## 2012-03-06 MED ORDER — ALBUTEROL SULFATE (5 MG/ML) 0.5% IN NEBU
2.5000 mg | INHALATION_SOLUTION | RESPIRATORY_TRACT | Status: DC | PRN
  Filled 2012-03-06: qty 0.5

## 2012-03-06 MED ORDER — METOPROLOL SUCCINATE ER 50 MG PO TB24
50.0000 mg | ORAL_TABLET | Freq: Every day | ORAL | Status: DC
  Administered 2012-03-06 – 2012-03-10 (×5): 50 mg via ORAL
  Filled 2012-03-06 (×6): qty 1

## 2012-03-06 MED ORDER — FUROSEMIDE 10 MG/ML IJ SOLN: 40.0000 mg | Freq: Two times a day (BID) | INTRAMUSCULAR | Status: DC

## 2012-03-06 NOTE — Progress Notes (Signed)
Subjective:    Currently, the patient is feeling well this morning without shortness of breath, difficulty breathing, chest pain, abdominal pain, nausea, vomiting. However, overnight, she did have some intermittent lower abdominal pain. As well, she experienced episode of shorntess of breath overnight while laying in bed. She was   Interval Events: None.   Objective:    Vital Signs:   Temp:  [97.4 F (36.3 C)-98.4 F (36.9 C)] 97.4 F (36.3 C) (07/20 0418) Pulse Rate:  [75-86] 75  (07/20 0418) Resp:  [18-20] 20  (07/20 0418) BP: (119-141)/(76-88) 137/88 mmHg (07/20 0418) SpO2:  [95 %-100 %] 96 % (07/20 0750) Weight:  [188 lb 6.4 oz (85.458 kg)] 188 lb 6.4 oz (85.458 kg) (07/20 0418) Last BM Date: 03/05/12  24-hour weight change: Weight change: -9 lb 12.7 oz (-4.442 kg)  Intake/Output: This is inaccurate because was not being strictly monitored.  Intake/Output Summary (Last 24 hours) at 03/06/12 0940 Last data filed at 03/06/12 0900  Gross per 24 hour  Intake    563 ml  Output    850 ml  Net   -287 ml      Physical Exam: General: Vital signs reviewed and noted. Well-developed, well-nourished, in no acute distress; alert, appropriate and cooperative throughout examination. No JVD appreciated.  Lungs:  Normal respiratory effort. Occasional expiratory wheezes. Otherwise, no significant crackles appreciated.   Heart: RRR. S1 and S2 normal without gallop, or rubs. (+) Murmur.   Abdomen:  BS normoactive. Soft, Nondistended, non-tender.  No masses or organomegaly.  Extremities: 1-2+ pretibial edema, L > R. Ted hose in place.      Labs:  Basic Metabolic Panel:  Lab 03/06/12 1610 03/05/12 1732 03/05/12 0437 03/04/12 1300  NA 139 -- 141 140  K 4.0 3.7 3.3* 3.2*  CL 101 -- 104 102  CO2 27 -- 29 26  GLUCOSE 136* -- 139* 187*  BUN 18 -- 13 14  CREATININE 0.83 -- 0.75 0.74  CALCIUM 9.3 -- 8.7 9.2  MG -- -- -- 1.9  PHOS -- -- -- --    Liver Function Tests:  Lab  03/04/12 1300  AST 18  ALT 7  ALKPHOS 75  BILITOT 0.8  PROT 6.9  ALBUMIN 3.7    CBC:  Lab 03/04/12 1300  WBC 3.0*  NEUTROABS 1.4*  HGB 12.2  HCT 37.2  MCV 93.5  PLT 233    Cardiac Enzymes:  Lab 03/05/12 0437 03/04/12 1957 03/04/12 1300  CKTOTAL 89 102 115  CKMB 3.4 3.8 3.9  CKMBINDEX -- -- --  TROPONINI <0.30 <0.30 <0.30    CBG:  Lab 03/06/12 0520 03/05/12 2056 03/05/12 1637 03/05/12 1106 03/05/12 0617  GLUCAP 143* 159* 115* 190* 126*    Imaging: Dg Chest 2 View  03/05/2012  *RADIOLOGY REPORT*  Clinical Data: Heart failure  CHEST - 2 VIEW  Comparison: 02/18/2012  Findings: Enlargement of the cardiac silhouette appears the same. The aorta shows calcification and unfolding.  There are small bilateral pleural effusions with basilar atelectasis.  There is venous hypertension but no frank edema.  IMPRESSION: Cardiomegaly, venous hypertension, small effusions and mild basilar atelectasis. Since the comparison study of 07/03, there is more pleural fluid.  Original Report Authenticated By: Thomasenia Sales, M.D.       Medications:    Infusions:    Scheduled Medications:    . ipratropium  0.5 mg Nebulization Q4H   And  . albuterol  2.5 mg Nebulization Q4H  . antiseptic oral  rinse  15 mL Mouth Rinse BID  . aspirin  81 mg Oral Daily  . brimonidine  1 drop Left Eye Q12H  . dorzolamide  1 drop Left Eye BID  . Fluticasone-Salmeterol  1 puff Inhalation BID  . furosemide  40 mg Intravenous BID  . heparin  5,000 Units Subcutaneous Q8H  . insulin aspart  0-9 Units Subcutaneous TID WC  . lisinopril  20 mg Oral Daily  . metoprolol succinate  25 mg Oral Daily  . potassium chloride  40 mEq Oral Q4H  . potassium chloride  40 mEq Oral Once  . sodium chloride  3 mL Intravenous Q12H  . DISCONTD: Fluticasone-Salmeterol  1 puff Inhalation BID    PRN Medications: acetaminophen, ondansetron (ZOFRAN) IV, sodium chloride, DISCONTD: albuterol, DISCONTD: ipratropium   Assessment/  Plan:    Pt is a 78 y.o. yo female with a PMHx of non-insulin dependent DMII, uncontrolled (A1c 7.6), multifocal atrial tachycardia, diastolic CHF, and remove history of systolic CHF (LV EF 25-35% in 1610) of unclear significance  who was admitted on 03/04/2012 with symptoms of worsening lower extremity edema. 2D echocardiogram on admission indicates systolic dysfunction with EF of 20-25%. Etiology of the cardiomyopathy remains unclear, and is yet to be elucidated.  1) Acute on chronic systolic CHF exacerbation - patient is noted to have progressively worsening lower extremity edema, shortness of breath on admission, with 2D echocardiogram confirming LV EF 20-25% which is severely reduced from last echocardiogram in 2003 showing EF of 50-55%. It does not appear that this cardiomyopathy has previously been worked up. Acute ischemia contributing towards systolic dysfunction has been ruled out with negative cardiac enzymes and no new EKG changes.  - Appreciate cardiology assistance and input in the management of our patient.  - Lasix regimen has been increased per cardiology given the minimal input and output and noted.  - Continue Lisinopril at current. - Toprol-XL was increased in dosage on 7 /20 per cardiology. - Cardiology plans Myoview in future to exclude any significant ischemic burden. Is not clear yet, when this will be scheduled. - Unclear if input and output is being accurately measured. I will discuss this with the nurse.  2) Bilateral leg swelling - improving. At least partially contributed by #1 in addition to venous insufficiency. Despite asymmetrical edema noted, LE dopplers are negative for DVT, but do show ruptured baker's cyst on left, likely contributing towards asymmetry. No indication of nephrotic syndrome, however, given hx of DMII and HTN, will also evaluate for nephrotic syndrome. Last microalb/ cr was within normal limits. - Continue TED hose. - Check urinalysis to evaluate for  proteinuria - pending at this time.  3) Abdominal pain - unclear etiology, the patient is not tender on my examination. She does have malodorous urine, although no overt dysuria. - Will continue to monitor the - Check urinalysis.  4) Hypertension - Well controlled on current Lisinopril and Metoprolol. - Continue current.    5) Multifocal atrial tachycardia: On admission her pulse was irregular and she was mildly tachycardic.  - We will continue to monitor with diuresis  6) History of asthma - on Advair as an outpatient and uses her albuterol several times a week (3x) - thereby indicating that she is not at baseline controlled of her asthma. She has no PFTs that I am able to locate in Epic - Dosage of Advair was increased on 7/19, given the frequency of her baseline albuterol usage. - While in the hospital we will continue  PRN albuterol now (will DC atrovent as the patient has asthma, not specifically COPD, and she is already received for the 24 hours of treatment with Atrovent as needed.)  7) Diabetes Mellitus Type II, HA1c 7.6 - noninsulin dependent. - Continue SSI for now.  - If no cardiac intervention needed, will resume Metformin soon. - Given age, can accept less tight blood sugar control with a goal A1c of < 8.   8) VTE: Heparin  9) Disposition - deferred at this time, pending improved diuresis.   Length of Stay: 2   Signed: Johnette Abraham, Roma Schanz, Internal Medicine Resident Pager: [phone] (7AM-5PM) 03/06/2012, 9:40 AM

## 2012-03-06 NOTE — Progress Notes (Signed)
   Subjective: Feels somewhat better. Less leg swelling. No chest pain. Appetite OK.   Objective: Blood pressure 137/88, pulse 75, temperature 97.4 F (36.3 C), temperature source Oral, resp. rate 20, height 5\' 7"  (1.702 m), weight 188 lb 6.4 oz (85.458 kg), SpO2 96.00%.  Intake/Output Summary (Last 24 hours) at 03/06/12 1001 Last data filed at 03/06/12 0900  Gross per 24 hour  Intake    563 ml  Output    850 ml  Net   -287 ml    Telemetry - Presently sinus rhythm with prolonged PR. Frequent ectopy noted.  Exam -   General - NAD.  Lungs - Mild EEW, decreased at bases.  Cardiac - RRR with ectopy.  Extremities - 2+ edema.  Lab Results -  Basic Metabolic Panel:  Lab 03/06/12 2956 03/05/12 1732 03/05/12 0437 03/04/12 1300  NA 139 -- 141 140  K 4.0 3.7 3.3* --  CL 101 -- 104 102  CO2 27 -- 29 26  GLUCOSE 136* -- 139* 187*  BUN 18 -- 13 14  CREATININE 0.83 -- 0.75 0.74  CALCIUM 9.3 -- 8.7 9.2  MG -- -- -- 1.9    Liver Function Tests:  Lab 03/04/12 1300  AST 18  ALT 7  ALKPHOS 75  BILITOT 0.8  PROT 6.9  ALBUMIN 3.7    CBC:  Lab 03/04/12 1300  WBC 3.0*  HGB 12.2  HCT 37.2  MCV 93.5  PLT 233    Cardiac Enzymes:  Lab 03/05/12 0437 03/04/12 1957 03/04/12 1300  CKTOTAL 89 102 115  CKMB 3.4 3.8 3.9  CKMBINDEX -- -- --  TROPONINI <0.30 <0.30 <0.30    BNP:  Basename 03/04/12 1300 02/18/12 1050 01/27/12 0902  PROBNP 3468.0* 3170.0* 2209*    Current Medications    . ipratropium  0.5 mg Nebulization Q4H   And  . albuterol  2.5 mg Nebulization Q4H  . antiseptic oral rinse  15 mL Mouth Rinse BID  . aspirin  81 mg Oral Daily  . brimonidine  1 drop Left Eye Q12H  . dorzolamide  1 drop Left Eye BID  . Fluticasone-Salmeterol  1 puff Inhalation BID  . furosemide  40 mg Oral BID  . heparin  5,000 Units Subcutaneous Q8H  . insulin aspart  0-9 Units Subcutaneous TID WC  . lisinopril  20 mg Oral Daily  . metoprolol succinate  25 mg Oral Daily  .  potassium chloride  40 mEq Oral Q4H  . potassium chloride  40 mEq Oral Once  . sodium chloride  3 mL Intravenous Q12H  . DISCONTD: Fluticasone-Salmeterol  1 puff Inhalation BID  . DISCONTD: furosemide  40 mg Intravenous BID    Assessment:  1. A/C systolic CHF, LVEF now 20% with moderate pulmonary HTN. DIffuse HK with normal troponins. No clear h/o IHD and infarct. Medical therapy being adjusted. Weight change does not seem realistic based on intake and output.  2. Palpitations with documented ectopic atrial tachycardia on monitor. Wonder if she has possible tachycardia-mediated CM.  3. Asthma.  4. HTN.  5. DM2.  Plan:  On ASA, Lisinopril, Lasix, Toprol XL - will increase dose if tolerated from pulmonary perspective. Needs further diuresis. Would eventually consider at least a Myoview to exclude any significant ischemic burden that might need further evaluation, although history is of nonischemic CM.  Jonelle Sidle, M.D., F.A.C.C.

## 2012-03-07 LAB — HEPATIC FUNCTION PANEL
ALT: 7 U/L (ref 0–35)
AST: 23 U/L (ref 0–37)
Albumin: 3.4 g/dL — ABNORMAL LOW (ref 3.5–5.2)
Indirect Bilirubin: 0.4 mg/dL (ref 0.3–0.9)
Total Protein: 6.4 g/dL (ref 6.0–8.3)

## 2012-03-07 LAB — CBC
HCT: 34.6 % — ABNORMAL LOW (ref 36.0–46.0)
MCH: 30.9 pg (ref 26.0–34.0)
MCHC: 32.7 g/dL (ref 30.0–36.0)
Platelets: 206 10*3/uL (ref 150–400)
RDW: 13.5 % (ref 11.5–15.5)
WBC: 2.7 10*3/uL — ABNORMAL LOW (ref 4.0–10.5)

## 2012-03-07 LAB — BASIC METABOLIC PANEL
BUN: 17 mg/dL (ref 6–23)
CO2: 28 mEq/L (ref 19–32)
Calcium: 9 mg/dL (ref 8.4–10.5)
Chloride: 102 mEq/L (ref 96–112)
Creatinine, Ser: 0.88 mg/dL (ref 0.50–1.10)
GFR calc Af Amer: 68 mL/min — ABNORMAL LOW (ref >90)
GFR calc non Af Amer: 59 mL/min — ABNORMAL LOW (ref >90)

## 2012-03-07 LAB — GLUCOSE, CAPILLARY: Glucose-Capillary: 129 mg/dL — ABNORMAL HIGH (ref 70–99)

## 2012-03-07 MED ORDER — FUROSEMIDE 10 MG/ML IJ SOLN
80.0000 mg | Freq: Two times a day (BID) | INTRAMUSCULAR | Status: DC
  Administered 2012-03-08 – 2012-03-09 (×4): 80 mg via INTRAVENOUS
  Filled 2012-03-07 (×6): qty 8

## 2012-03-07 NOTE — Evaluation (Signed)
Physical Therapy Evaluation Patient Details Name: [name] MRN: [id number] DOB: 03-Sep-1925 Today's Date: 03/07/2012 Time: 1478-2956 PT Time Calculation (min): 18 min  PT Assessment / Plan / Recommendation Clinical Impression  Pt is an 78 y/o female admitted s/p CHF exacerbation along with the below PT problem list.  Pt would benefit from acute PT to maximize independence and facilitate d/c home with HHPT.    PT Assessment  Patient needs continued PT services    Follow Up Recommendations  Home health PT    Barriers to Discharge None      Equipment Recommendations  None recommended by PT    Recommendations for Other Services     Frequency Min 3X/week    Precautions / Restrictions Precautions Precautions: Fall Restrictions Weight Bearing Restrictions: No   Pertinent Vitals/Pain None      Mobility  Bed Mobility Bed Mobility: Supine to Sit Supine to Sit: 4: Min guard;HOB flat Details for Bed Mobility Assistance: Guarding for balance with cues for sequence. Transfers Transfers: Sit to Stand;Stand to Sit Sit to Stand: 4: Min assist;With upper extremity assist;From bed Stand to Sit: 4: Min assist;With upper extremity assist;To chair/3-in-1 Details for Transfer Assistance: Assist for balance and to slow descent with cues for safest hand placement due to patient with tendency to put hands on RW. Ambulation/Gait Ambulation/Gait Assistance: 4: Min assist Ambulation Distance (Feet): 110 Feet Assistive device: Rolling walker Ambulation/Gait Assistance Details: Assist for balance with cues for tall posture and pursed lip breathing. Gait Pattern: Step-through pattern;Decreased stride length;Trunk flexed;Narrow base of support Stairs: No Wheelchair Mobility Wheelchair Mobility: No    Exercises     PT Diagnosis: Difficulty walking;Generalized weakness  PT Problem List: Decreased strength;Decreased activity tolerance;Decreased balance;Decreased mobility;Decreased knowledge of  use of DME;Cardiopulmonary status limiting activity PT Treatment Interventions: DME instruction;Gait training;Stair training;Functional mobility training;Therapeutic activities;Balance training;Patient/family education   PT Goals Acute Rehab PT Goals PT Goal Formulation: With patient Time For Goal Achievement: 03/14/12 Potential to Achieve Goals: Good Pt will go Supine/Side to Sit: with modified independence PT Goal: Supine/Side to Sit - Progress: Goal set today Pt will go Sit to Supine/Side: with modified independence PT Goal: Sit to Supine/Side - Progress: Goal set today Pt will go Sit to Stand: with modified independence PT Goal: Sit to Stand - Progress: Goal set today Pt will go Stand to Sit: with modified independence PT Goal: Stand to Sit - Progress: Goal set today Pt will Ambulate: >150 feet;with modified independence;with least restrictive assistive device PT Goal: Ambulate - Progress: Goal set today Pt will Go Up / Down Stairs: 1-2 stairs;with min assist;with least restrictive assistive device PT Goal: Up/Down Stairs - Progress: Goal set today  Visit Information  Last PT Received On: 03/07/12 Assistance Needed: +1    Subjective Data  Subjective: "I am up for it!" Patient Stated Goal: Get well.   Prior Functioning  Home Living Lives With: Alone Available Help at Discharge: Family;Available PRN/intermittently Type of Home: House Home Access: Stairs to enter [employer] of Steps: 2 Entrance Stairs-Rails: None Home Layout: One level Home Adaptive Equipment: Walker - rolling Prior Function Level of Independence: Independent with assistive device(s) (Uses RW or single point cane.) Able to Take Stairs?: Yes Driving: No Vocation: Retired Musician: No difficulties    Cognition  Overall Cognitive Status: Appears within functional limits for tasks assessed/performed Arousal/Alertness: Awake/alert Orientation Level: Appears intact for tasks  assessed Behavior During Session: [facility] for tasks performed    Extremity/Trunk Assessment Right Upper Extremity Assessment  RUE ROM/Strength/Tone: Within functional levels RUE Sensation: WFL - Light Touch RUE Coordination: WFL - gross/fine motor Left Upper Extremity Assessment LUE ROM/Strength/Tone: Within functional levels LUE Sensation: WFL - Light Touch LUE Coordination: WFL - gross/fine motor Right Lower Extremity Assessment RLE ROM/Strength/Tone: Within functional levels RLE Sensation: WFL - Light Touch RLE Coordination: WFL - gross/fine motor Left Lower Extremity Assessment LLE ROM/Strength/Tone: Within functional levels LLE Sensation: WFL - Light Touch LLE Coordination: WFL - gross/fine motor Trunk Assessment Trunk Assessment: Normal   Balance Balance Balance Assessed: No  End of Session PT - End of Session Equipment Utilized During Treatment: Gait belt;Oxygen (On 4L O2 via [place].) Activity Tolerance: Patient tolerated treatment well Patient left: in chair;with call bell/phone within reach Nurse Communication: Mobility status  GP     Cephus Shelling 03/07/2012, 8:11 AM  03/07/2012 Cephus Shelling, PT, DPT [phone]

## 2012-03-07 NOTE — Progress Notes (Signed)
   Subjective: Feels better today. No chest pain. Nonproductive cough.   Objective: Blood pressure 126/69, pulse 79, temperature 98.4 F (36.9 C), temperature source Oral, resp. rate 18, height 5\' 7"  (1.702 m), weight 190 lb 4.1 oz (86.3 kg), SpO2 100.00%.  Intake/Output Summary (Last 24 hours) at 03/07/12 0851 Last data filed at 03/07/12 0810  Gross per 24 hour  Intake   1020 ml  Output   1502 ml  Net   -482 ml    Telemetry - Sinus rhythm with prolonged PR. Frequent ectopy noted.   Exam -   General - NAD.   Lungs - Mild EEW, decreased at bases.   Cardiac - RRR with ectopy.   Extremities - 2+ edema.  Lab Results -  Basic Metabolic Panel:  Lab 03/07/12 1610 03/06/12 0950 03/06/12 0540 03/04/12 1300  NA 142 137 139 --  K 4.0 4.0 4.0 --  CL 102 100 101 --  CO2 28 25 27  --  GLUCOSE 144* 169* 136* --  BUN 17 18 18  --  CREATININE 0.88 0.81 0.83 --  CALCIUM 9.0 9.1 9.3 --  MG -- -- -- 1.9    Liver Function Tests:  Lab 03/04/12 1300  AST 18  ALT 7  ALKPHOS 75  BILITOT 0.8  PROT 6.9  ALBUMIN 3.7    CBC:  Lab 03/07/12 0500 03/04/12 1300  WBC 2.7* 3.0*  HGB 11.3* 12.2  HCT 34.6* 37.2  MCV 94.5 93.5  PLT 206 233    Cardiac Enzymes:  Lab 03/05/12 0437 03/04/12 1957 03/04/12 1300  CKTOTAL 89 102 115  CKMB 3.4 3.8 3.9  CKMBINDEX -- -- --  TROPONINI <0.30 <0.30 <0.30    Current Medications    . antiseptic oral rinse  15 mL Mouth Rinse BID  . aspirin  81 mg Oral Daily  . brimonidine  1 drop Left Eye Q12H  . dorzolamide  1 drop Left Eye BID  . Fluticasone-Salmeterol  1 puff Inhalation BID  . furosemide  60 mg Intravenous BID  . heparin  5,000 Units Subcutaneous Q8H  . insulin aspart  0-9 Units Subcutaneous TID WC  . lisinopril  20 mg Oral Daily  . metoprolol succinate  50 mg Oral Daily  . sodium chloride  3 mL Intravenous Q12H  . DISCONTD: albuterol  2.5 mg Nebulization Q4H  . DISCONTD: albuterol  2.5 mg Nebulization Q6H  . DISCONTD: albuterol   2.5 mg Nebulization Q6H  . DISCONTD: furosemide  40 mg Intravenous BID  . DISCONTD: furosemide  40 mg Intravenous BID  . DISCONTD: furosemide  40 mg Oral BID  . DISCONTD: ipratropium  0.5 mg Nebulization Q4H  . DISCONTD: metoprolol succinate  25 mg Oral Daily    Assessment:  1. A/C systolic CHF, LVEF now 20% with moderate pulmonary HTN. DIffuse HK with normal troponins. No clear h/o IHD or infarct. Medical therapy being adjusted. Gentle diuresis continues. Still evidence of volume overload.  2. Palpitations with documented ectopic atrial tachycardia on monitor. Wonder if she has possible tachycardia-mediated CM.   3. Asthma.   4. HTN.   5. DM2.  Plan:  Will increase Lasix dose and continue other cardiac medications. BMET AM. Would consider followup Myoview to assess for ischemia once she is more stable from volume perspective.  Jonelle Sidle, M.D., F.A.C.C.

## 2012-03-07 NOTE — Progress Notes (Signed)
Subjective:    Currently, the patient states that her lower extremity edema is improving, she remains without chest pain, shortness of breath, difficulty breathing. She has experienced some nausea upon attempting to eat her lunch today. However, she has not experienced any overt abdominal pain, or vomiting. Confirms loose stools, 3-4 times daily. No fevers or chills.  Interval Events: None.    Objective:    Vital Signs:   Temp:  [98.1 F (36.7 C)-98.4 F (36.9 C)] 98.4 F (36.9 C) (07/21 0532) Pulse Rate:  [79-88] 79  (07/21 0532) Resp:  [18] 18  (07/21 0532) BP: (123-126)/(69-80) 126/69 mmHg (07/21 0532) SpO2:  [95 %-100 %] 100 % (07/21 0700) Weight:  [190 lb 4.1 oz (86.3 kg)] 190 lb 4.1 oz (86.3 kg) (07/21 0532) Last BM Date: 03/06/12  24-hour weight change: Weight change: 1 lb 13.7 oz (0.842 kg)  Intake/Output:   Intake/Output Summary (Last 24 hours) at 03/07/12 1014 Last data filed at 03/07/12 0810  Gross per 24 hour  Intake    780 ml  Output   1101 ml  Net   -321 ml      Physical Exam: General: Vital signs reviewed and noted. Well-developed, well-nourished, in no acute distress; alert, appropriate and cooperative throughout examination. No JVD appreciated.  Lungs:  Normal respiratory effort. CTA bilaterally, without significant crackles, wheezes appreciated.   Heart: RRR. S1 and S2 normal without gallop, or rubs. (+) Murmur.   Abdomen:  BS normoactive. Soft, Nondistended, non-tender.  No masses or organomegaly.  Extremities: 1-2+ pretibial edema, L > R. No TED hose today.     Labs:  Basic Metabolic Panel:  Lab 03/07/12 9147 03/06/12 0950 03/06/12 0540 03/05/12 1732 03/05/12 0437 03/04/12 1300  NA 142 137 139 -- 141 140  K 4.0 4.0 4.0 3.7 3.3* --  CL 102 100 101 -- 104 102  CO2 28 25 27  -- 29 26  GLUCOSE 144* 169* 136* -- 139* 187*  BUN 17 18 18  -- 13 14  CREATININE 0.88 0.81 0.83 -- 0.75 0.74  CALCIUM 9.0 9.1 9.3 -- -- --  MG -- -- -- -- -- 1.9  PHOS  -- -- -- -- -- --    Liver Function Tests:  Lab 03/04/12 1300  AST 18  ALT 7  ALKPHOS 75  BILITOT 0.8  PROT 6.9  ALBUMIN 3.7    CBC:  Lab 03/07/12 0500 03/04/12 1300  WBC 2.7* 3.0*  NEUTROABS -- 1.4*  HGB 11.3* 12.2  HCT 34.6* 37.2  MCV 94.5 93.5  PLT 206 233    Cardiac Enzymes:  Lab 03/05/12 0437 03/04/12 1957 03/04/12 1300  CKTOTAL 89 102 115  CKMB 3.4 3.8 3.9  CKMBINDEX -- -- --  TROPONINI <0.30 <0.30 <0.30    CBG:  Lab 03/07/12 0637 03/06/12 2101 03/06/12 1627 03/06/12 1130 03/06/12 0520  GLUCAP 129* 163* 161* 149* 143*    Imaging: No results found.     Medications:    Infusions:    Scheduled Medications:    . antiseptic oral rinse  15 mL Mouth Rinse BID  . aspirin  81 mg Oral Daily  . brimonidine  1 drop Left Eye Q12H  . dorzolamide  1 drop Left Eye BID  . Fluticasone-Salmeterol  1 puff Inhalation BID  . furosemide  80 mg Intravenous BID  . heparin  5,000 Units Subcutaneous Q8H  . insulin aspart  0-9 Units Subcutaneous TID WC  . lisinopril  20 mg Oral Daily  .  metoprolol succinate  50 mg Oral Daily  . sodium chloride  3 mL Intravenous Q12H  . DISCONTD: albuterol  2.5 mg Nebulization Q4H  . DISCONTD: albuterol  2.5 mg Nebulization Q6H  . DISCONTD: albuterol  2.5 mg Nebulization Q6H  . DISCONTD: furosemide  40 mg Intravenous BID  . DISCONTD: furosemide  60 mg Intravenous BID  . DISCONTD: furosemide  40 mg Oral BID  . DISCONTD: ipratropium  0.5 mg Nebulization Q4H  . DISCONTD: metoprolol succinate  25 mg Oral Daily    PRN Medications: acetaminophen, albuterol, ondansetron (ZOFRAN) IV, sodium chloride   Assessment/ Plan:    Pt is a 78 y.o. yo Lowery with a PMHx of non-insulin dependent DMII, uncontrolled (A1c 7.6), multifocal atrial tachycardia, diastolic CHF, and remove history of systolic CHF (LV EF 25-35% in 1610) of unclear significance  who was admitted on 03/04/2012 with symptoms of worsening lower extremity edema. 2D  echocardiogram on admission indicates systolic dysfunction with EF of 20-25%. Etiology of the cardiomyopathy remains unclear, and is yet to be elucidated.  1) Acute on chronic systolic CHF exacerbation - patient is noted to have progressively worsening lower extremity edema, shortness of breath on admission, with 2D echocardiogram confirming LV EF 20-25% which is severely reduced from last echocardiogram in 2003 showing EF of 50-55%. It does not appear that this cardiomyopathy has previously been worked up. Acute ischemia contributing towards systolic dysfunction has been ruled out with negative cardiac enzymes and no new EKG changes.  - Appreciate cardiology assistance and input in the management of our patient.  - Lasix regimen has been was further increased this AM. - Continue Lisinopril at current. - Toprol-XL was increased in dosage on 7 /20 per cardiology. - Cardiology plans Myoview in future to exclude any significant ischemic burden. Is not clear yet, when this will be scheduled. - Unclear if input and output is being accurately measured. I will discuss this with the nurse.  2) Bilateral leg swelling - improving. At least partially contributed by #1 in addition to venous insufficiency. Despite asymmetrical edema noted, LE dopplers are negative for DVT, but do show ruptured baker's cyst on left, likely contributing towards asymmetry. No indication of nephrotic syndrome, however, given hx of DMII and HTN, will also evaluate for nephrotic syndrome. Last microalb/ cr was within normal limits. - Continue TED hose. - Check urinalysis to evaluate for proteinuria - pending at this time.  3) Nausea / abdominal pain - unclear etiology, the patient is not tender on my examination. UA negative for evidence of significant infection (WBC only 3-6 even though small leukocytes noted). However, she does also have a history of gallbladder thickening and sludge noted per [place] in 2001 and hepatic cysts. Loose  stools improving. - Will provide supportive tx with Zofran. - Add urine culture to UA already collected. - Check lipase and CMET - if abnormal, will consider abdominal ultrasound.  - If increased frequency of loose stools, or worsening leukocytosis, will consider to check C. difficile PCR.  4) Hypertension - Well controlled on current Lisinopril and Metoprolol, and is tolerating the Lasix regimen. - Continue current.    5) Multifocal atrial tachycardia: On admission her pulse was irregular and she was mildly tachycardic.  - We will continue to monitor with diuresis  6) History of asthma - on Advair as an outpatient and uses her albuterol several times a week (3x) - thereby indicating that she is not at baseline controlled of her asthma. She has no  PFTs that I am able to locate in Epic - Dosage of Advair was increased on 7/19, given the frequency of her baseline albuterol usage. - While in the hospital we will continue PRN albuterol now  7) Diabetes Mellitus Type II, HA1c 7.6 - noninsulin dependent. - Continue SSI for now.  - If no cardiac intervention needed, will resume Metformin soon. - Given age, can accept less tight blood sugar control with a goal A1c of < 8.   8) Neutropenia -   9) VTE: Heparin  10) Disposition - deferred at this time, pending improved diuresis.   Length of Stay: 3   Signed: Johnette Abraham, Roma Schanz, Internal Medicine Resident Pager: [phone] (7AM-5PM) 03/07/2012, 10:14 AM

## 2012-03-08 DIAGNOSIS — I5023 Acute on chronic systolic (congestive) heart failure: Secondary | ICD-10-CM

## 2012-03-08 LAB — GLUCOSE, CAPILLARY
Glucose-Capillary: 186 mg/dL — ABNORMAL HIGH (ref 70–99)
Glucose-Capillary: 137 mg/dL — ABNORMAL HIGH (ref 70–99)
Glucose-Capillary: 190 mg/dL — ABNORMAL HIGH (ref 70–99)
Glucose-Capillary: 146 mg/dL — ABNORMAL HIGH (ref 70–99)

## 2012-03-08 MED ORDER — PANTOPRAZOLE SODIUM 40 MG PO TBEC
40.0000 mg | DELAYED_RELEASE_TABLET | Freq: Every day | ORAL | Status: DC
  Administered 2012-03-08 – 2012-03-12 (×5): 40 mg via ORAL
  Filled 2012-03-08 (×3): qty 1

## 2012-03-08 MED ORDER — ALUM & MAG HYDROXIDE-SIMETH 200-200-20 MG/5ML PO SUSP: 15.0000 mL | ORAL | Status: DC | PRN

## 2012-03-08 NOTE — Progress Notes (Signed)
Patient ID: [name], female   DOB: June 09, 1926, 78 y.o.   MRN: [id number] Medical Student Daily Progress Note  Subjective: States that she is doing ok. Says she has abdominal pain which comes and goes. Also has nausea associated with eating.  Interval Events: No acute events overnight. Objective: Vital signs in last 24 hours: Filed Vitals:   03/07/12 2245 03/08/12 0630 03/08/12 0859 03/08/12 0904  BP:  124/66 124/69   Pulse:  74 81   Temp:  98.2 F (36.8 C)    TempSrc:  Oral    Resp:  18 16   Height:      Weight:  86.6 kg (190 lb 14.7 oz)    SpO2: 95% 97% 99% 97%   Weight change: 0.3 kg (10.6 oz)  Intake/Output Summary (Last 24 hours) at 03/08/12 1338 Last data filed at 03/08/12 1324  Gross per 24 hour  Intake    700 ml  Output    951 ml  Net   -251 ml   Physical Exam: General appearance: alert, cooperative and no distress Back: symmetric, no curvature. ROM normal. No CVA tenderness. Lungs: clear to auscultation bilaterally Heart: irregularly irregular rhythm Abdomen: soft, non-tender; bowel sounds normal; no masses,  no organomegaly Extremities: edema 1+ on right and 2+ on left Lab Results: Basic Metabolic Panel:  Lab 03/07/12 4098 03/06/12 0950 03/04/12 1300  NA 142 137 --  K 4.0 4.0 --  CL 102 100 --  CO2 28 25 --  GLUCOSE 144* 169* --  BUN 17 18 --  CREATININE 0.88 0.81 --  CALCIUM 9.0 9.1 --  MG -- -- 1.9  PHOS -- -- --   Liver Function Tests:  Lab 03/07/12 0500 03/04/12 1300  AST 23 18  ALT 7 7  ALKPHOS 67 75  BILITOT 0.6 0.8  PROT 6.4 6.9  ALBUMIN 3.4* 3.7   No results found for this basename: LIPASE:2,AMYLASE:2 in the last 168 hours No results found for this basename: AMMONIA:2 in the last 168 hours CBC:  Lab 03/07/12 0500 03/04/12 1300  WBC 2.7* 3.0*  NEUTROABS -- 1.4*  HGB 11.3* 12.2  HCT 34.6* 37.2  MCV 94.5 93.5  PLT 206 233   Cardiac Enzymes:  Lab 03/05/12 0437 03/04/12 1957 03/04/12 1300  CKTOTAL 89 102 115  CKMB 3.4 3.8  3.9  CKMBINDEX -- -- --  TROPONINI <0.30 <0.30 <0.30   BNP:  Lab 03/04/12 1300  PROBNP 3468.0*   D-Dimer: No results found for this basename: DDIMER:2 in the last 168 hours CBG:  Lab 03/08/12 1113 03/08/12 0620 03/07/12 2119 03/07/12 1620 03/07/12 1118 03/07/12 0637  GLUCAP 146* 132* 190* 137* 186* 129*   Hemoglobin A1C: No results found for this basename: HGBA1C in the last 168 hours Fasting Lipid Panel: No results found for this basename: CHOL,HDL,LDLCALC,TRIG,CHOLHDL,LDLDIRECT in the last 119 hours Thyroid Function Tests:  Lab 03/04/12 1300  TSH 1.627  T4TOTAL --  FREET4 1.33  T3FREE --  THYROIDAB --   Coagulation: No results found for this basename: LABPROT:4,INR:4 in the last 168 hours Anemia Panel: No results found for this basename: VITAMINB12,FOLATE,FERRITIN,TIBC,IRON,RETICCTPCT in the last 168 hours Urine Drug Screen: Drugs of Abuse  No results found for this basename: labopia, cocainscrnur, labbenz, amphetmu, thcu, labbarb    Alcohol Level: No results found for this basename: ETH:2 in the last 168 hours Urinalysis:  Lab 03/06/12 1112  COLORURINE AMBER*  LABSPEC 1.029  PHURINE 5.5  GLUCOSEU NEGATIVE  HGBUR NEGATIVE  BILIRUBINUR SMALL*  KETONESUR 15*  PROTEINUR 100*  UROBILINOGEN 1.0  NITRITE NEGATIVE  LEUKOCYTESUR SMALL*   Misc. Labs:   Micro Results: No results found for this or any previous visit (from the past 240 hour(s)). Studies/Results: No results found. Medications: I have reviewed the patient's current medications. Scheduled Meds:   . antiseptic oral rinse  15 mL Mouth Rinse BID  . aspirin  81 mg Oral Daily  . brimonidine  1 drop Left Eye Q12H  . dorzolamide  1 drop Left Eye BID  . Fluticasone-Salmeterol  1 puff Inhalation BID  . furosemide  80 mg Intravenous BID  . heparin  5,000 Units Subcutaneous Q8H  . insulin aspart  0-9 Units Subcutaneous TID WC  . lisinopril  20 mg Oral Daily  . metoprolol succinate  50 mg Oral Daily    . pantoprazole  40 mg Oral Q1200  . sodium chloride  3 mL Intravenous Q12H   Continuous Infusions:  PRN Meds:.acetaminophen, albuterol, alum & mag hydroxide-simeth, ondansetron (ZOFRAN) IV, sodium chloride Assessment/Plan: [name] is a 78 y/o lady with PMH significant for DMII not dependent on insulin, multifocal atrial tachycardia, diastolic CHF, and a history of systolic CHF (25%-35% in 2001) was admitted on 03/04/2012 from an outpatient medical clinic with worsening leg edema and shortness of breath. After admission, echocardiogram confirmed systolic heart failure with an EF of 20-25%. Unknown cause of heart failure but is being worked up by the primary team and Cardiology.  1. Acute on chronic CHF exacerbation- patient had initial 3+ edema bilaterally along with shortness of breath upon presentation to the ED from a medical clinic. Patient's past history indicated an initial EF that would classify her as having systolic heart failure but a following echo showed a normal EF which with symptoms of CHF, would classify her as having diastolic heart failure. Echo for this admission on 03/04/2012 showed EF of 20-25%. The cause of this heart failure is unclear at this time and has not been previously worked up. In the ED acute ischemia was ruled out 2/2 negative cardiac enzymes and normal EKG. Cardiology has been consulted to further work up this acute exacerbation 2/2 what appears to be chronic congestive heart failure. -Cardiology assistance appreciated -Lasix regiment is 80 mg IV BID -Continue Lisinopril 20 mg -Continue Metoprolol 50 mg -Cardiology is considering possible Myoview on Wednesday -I/O's, patient was negative 314 ml from yesterday at 0800. Unclear if accurate.  2. Leg Swelling- Patient's chief complaint was bilateral leg swelling. On presentation, that patient had 3+ pitting edema bilaterally. Today the patient has slight or 1+ edema on the right and 2+ edema on the left. LE dopplers  were ordered because of worse edema on the left side and revealed no DVT's only previous ruptured Bursa Sac. Also, [name] has a history of venous insufficiency on the left which might contribute to the worse edema on that side as compared to the right. Initial Lasix regiment was 40 mg BID but this has been increased to it's current level of 80 mg IV BID. No sign of nephrotic syndrome. -continue diuresis, baseline weight appears to be around 180 lbs -continue TED hose  3. Nausea/ abdominal pain- patient denies any tenderness to palpation. Etiologies include UTI, GERD, dyspepsia, hepatitis, and gall bladder etiologies. Hepatitis panel was negative and UA was negative for any signs of a UTI. Patient has a history of gallbladder sludge in 2001. Bowel sounds are present. Will prescribe Zofran for a nausea and a PPI for possible  GERD/dyspepsia. Consider abdominal ultrasound if no improvement. -Continue Zofran -Add PPI -Consider abdominal ultrasound if no improvement  4. Hypertension- Well controlled on Lisinopril 20 mg and Metoprolol 50 mg.  5. Multifocal atrial tachycardia- Has an irregular rhythm but is not afib. -Will continue to monitor -On aspirin 81mg   6. Self report of Asthma- Patient states that she has asthma but there's no record of any PFT's diagnosing the patient with asthma. Would consider outpatient PFT's to assess lung function. -Continue scheduled Advair -Continue albuterol PRN  7. Non-insulin dependent DM II- Sugars are ranging from 132-190 which are adequate considering the inpatient setting. Will continue coverage with SSI -will likely restart Metformin as outpatient pending Cardiac work up  8. Prophy- Heparin  9. Dispo- Pending further diuresis and further cardiac work-up     LOS: 4 days   This is a Psychologist, occupational Note.  The care of the patient was discussed with Dr. Saralyn Pilar and the assessment and plan formulated with their assistance.  Please see their attached  note for official documentation of the daily encounter.  Eather Colas T 03/08/2012, 1:38 PM

## 2012-03-08 NOTE — Progress Notes (Signed)
Physical Therapy Treatment Patient Details Name: [name] MRN: [id number] DOB: 09-Apr-1926 Today's Date: 03/08/2012 Time: 7829-5621 PT Time Calculation (min): 17 min  PT Assessment / Plan / Recommendation Comments on Treatment Session  Pt fatigues quickly.      Follow Up Recommendations  Home health PT    Barriers to Discharge        Equipment Recommendations  None recommended by PT    Recommendations for Other Services    Frequency Min 3X/week   Plan Discharge plan remains appropriate    Precautions / Restrictions Precautions Precautions: Fall Restrictions Weight Bearing Restrictions: No       Mobility  Bed Mobility Bed Mobility: Supine to Sit;Sitting - Scoot to Edge of Bed Supine to Sit: 5: Supervision;HOB flat Sitting - Scoot to Edge of Bed: 5: Supervision Details for Bed Mobility Assistance: supervision for safety.  no physical assist needed.  Transfers Transfers: Sit to Stand;Stand to Sit Sit to Stand: 4: Min guard;With upper extremity assist;From bed Stand to Sit: 4: Min guard;With upper extremity assist;With armrests;To chair/3-in-1 Details for Transfer Assistance: cues for hand placement & technique.   Ambulation/Gait Ambulation/Gait Assistance: 4: Min guard Ambulation Distance (Feet): 80 Feet Assistive device: Rolling walker Ambulation/Gait Assistance Details: distance limited by fatigue.  Cues for tall posture & proper breathing.  Trialed ambulation without supplemental 02.  Sats 95% at midpoint of distance.     Gait Pattern: Step-through pattern;Decreased stride length;Trunk flexed      PT Goals Acute Rehab PT Goals Time For Goal Achievement: 03/14/12 Potential to Achieve Goals: Good PT Goal: Supine/Side to Sit - Progress: Progressing toward goal PT Goal: Sit to Stand - Progress: Not progressing PT Goal: Stand to Sit - Progress: Not progressing PT Goal: Ambulate - Progress: Not progressing  Visit Information  Last PT Received On:  03/08/12 Assistance Needed: +1    Subjective Data      Cognition  Overall Cognitive Status: Appears within functional limits for tasks assessed/performed Arousal/Alertness: Awake/alert Orientation Level: Appears intact for tasks assessed Behavior During Session: [facility] for tasks performed    Balance     End of Session PT - End of Session Equipment Utilized During Treatment: Gait belt;Oxygen Activity Tolerance: Patient limited by fatigue Patient left: in chair;with call bell/phone within reach Nurse Communication: Mobility status    Verdell Face, [place] [phone] 03/08/2012

## 2012-03-08 NOTE — Progress Notes (Signed)
   Subjective: No chest pain. Dyspnea improved   Objective: Blood pressure 124/69, pulse 81, temperature 98.2 F (36.8 C), temperature source Oral, resp. rate 16, height 5\' 7"  (1.702 m), weight 86.6 kg (190 lb 14.7 oz), SpO2 97.00%.  Intake/Output Summary (Last 24 hours) at 03/08/12 1132 Last data filed at 03/08/12 0823  Gross per 24 hour  Intake    480 ml  Output    551 ml  Net    -71 ml    Telemetry - Sinus rhythm  Exam -   General - NAD.   Lungs - Mild decreased at bases.   Cardiac - RRR  Extremities - 2+ edema.  Lab Results -  Basic Metabolic Panel:  Lab 03/07/12 5409 03/06/12 0950 03/06/12 0540 03/04/12 1300  NA 142 137 139 --  K 4.0 4.0 4.0 --  CL 102 100 101 --  CO2 28 25 27  --  GLUCOSE 144* 169* 136* --  BUN 17 18 18  --  CREATININE 0.88 0.81 0.83 --  CALCIUM 9.0 9.1 9.3 --  MG -- -- -- 1.9    Liver Function Tests:  Lab 03/07/12 0500 03/04/12 1300  AST 23 18  ALT 7 7  ALKPHOS 67 75  BILITOT 0.6 0.8  PROT 6.4 6.9  ALBUMIN 3.4* 3.7    CBC:  Lab 03/07/12 0500 03/04/12 1300  WBC 2.7* 3.0*  HGB 11.3* 12.2  HCT 34.6* 37.2  MCV 94.5 93.5  PLT 206 233    Cardiac Enzymes:  Lab 03/05/12 0437 03/04/12 1957 03/04/12 1300  CKTOTAL 89 102 115  CKMB 3.4 3.8 3.9  CKMBINDEX -- -- --  TROPONINI <0.30 <0.30 <0.30    Current Medications    . antiseptic oral rinse  15 mL Mouth Rinse BID  . aspirin  81 mg Oral Daily  . brimonidine  1 drop Left Eye Q12H  . dorzolamide  1 drop Left Eye BID  . Fluticasone-Salmeterol  1 puff Inhalation BID  . furosemide  80 mg Intravenous BID  . heparin  5,000 Units Subcutaneous Q8H  . insulin aspart  0-9 Units Subcutaneous TID WC  . lisinopril  20 mg Oral Daily  . metoprolol succinate  50 mg Oral Daily  . pantoprazole  40 mg Oral Q1200  . sodium chloride  3 mL Intravenous Q12H    Assessment:  1. A/C systolic CHF, LVEF now 20% with moderate pulmonary HTN. DIffuse HK with normal troponins. No clear h/o IHD or  infarct. Medical therapy being adjusted. Gentle diuresis continues. Still evidence of volume overload. Follow renal function.  2. Palpitations with documented ectopic atrial tachycardia on monitor. Wonder if she has possible tachycardia-mediated CM. Continue beta blocker.  3. Asthma.   4. HTN.   5. DM2.  Plan:  Continue lasix other cardiac medications. BMET AM. Plan Myoview to assess for ischemia once she is more stable from volume perspective, probably Wed  Olga Millers M.D., F.A.C.C. 11:36 AM

## 2012-03-08 NOTE — Progress Notes (Signed)
Resident Addendum to Medical Student Note   I have seen and examined the patient, and reviewed the daily progress note by Eather Colas, MS IV and discussed the care of the patient with them. See below for documentation of my findings, assessment, and plans.   Subjective:    Currently, the patient has no acute pulmonary complaints. She does complain of persistent abdominal discomfort and nausea. No vomiting, blood in stools, loose stools any more, no fevers, chills.  Interval Events: None.   Objective:    VS: Reviewed as documented in electronic medical record  Meds: Reviewed as documented in electronic medical record  Labs: Reviewed as documented in electronic medical record  Imaging: Reviewed as documented in electronic medical record    Physical Exam: General: Vital signs reviewed and noted. Well-developed, well-nourished, in no acute distress; alert, appropriate and cooperative throughout examination. No JVD appreciated.  Lungs:  Normal respiratory effort. CTA bilaterally, without significant crackles, wheezes appreciated.   Heart: RRR. S1 and S2 normal without gallop, or rubs. (+) Murmur.   Abdomen:  BS normoactive. Soft, Nondistended, non-tender.  No masses or organomegaly.  Extremities: 1-2+ pretibial edema, L > R. (+) TED hose today.     Assessment/ Plan:    Pt is a 78 y.o. yo female with a PMHx of non-insulin dependent DMII, uncontrolled (A1c 7.6), multifocal atrial tachycardia, diastolic CHF, and remove history of systolic CHF (LV EF 25-35% in 4540) of unclear significance who was admitted on 03/04/2012 with symptoms of worsening lower extremity edema. 2D echocardiogram on admission indicates systolic dysfunction with EF of 20-25%. Etiology of the cardiomyopathy remains unclear, and is yet to be elucidated.   1) Acute on chronic systolic CHF exacerbation - patient is noted to have progressively worsening lower extremity edema, shortness of breath on admission, with 2D  echocardiogram confirming LV EF 20-25% which is severely reduced from last echocardiogram in 2003 showing EF of 50-55%. It does not appear that this cardiomyopathy has previously been worked up. Acute ischemia contributing towards systolic dysfunction has been ruled out with negative cardiac enzymes and no new EKG changes.  - Appreciate cardiology assistance and input in the management of our patient.  - Continue Lasix at current. - Continue Lisinopril at current.  - Toprol-XL was increased in dosage on 7/20 per cardiology.  - Cardiology plans Myoview in future to exclude any significant ischemic burden - anticipated to be performed 03/10/2012  2) Bilateral leg swelling - improving. At least partially contributed by #1 in addition to venous insufficiency. Despite asymmetrical edema noted, LE dopplers are negative for DVT, but do show ruptured baker's cyst on left, likely contributing towards asymmetry. No indication of nephrotic syndrome, however, given hx of DMII and HTN, will also evaluate for nephrotic syndrome. Last microalb/ cr was within normal limits. UA showing 100 protein, not consistent with a nephrotic range. - Continue TED hose.    3) Nausea / abdominal pain - unclear etiology, the patient is not tender on my examination. UA negative for evidence of significant infection (WBC only 3-6 even though small leukocytes noted). However, she does also have a history of gallbladder thickening and sludge noted per Abd [place] in 2001 and hepatic cysts. Loose stools improving. LFTs and lipase unremarkable. - Will provide supportive tx with Zofran.  - Add urine culture to UA already collected.  - Adding PPI and PRN Maalox today. - If increased frequency of loose stools, or worsening leukocytosis, will consider to check C. difficile PCR.  4) Hypertension - Well controlled on current Lisinopril and Metoprolol, and is tolerating the Lasix regimen.  - Continue current.   5) Multifocal atrial tachycardia:  On admission her pulse was irregular and she was mildly tachycardic.  - We will continue to monitor with diuresis   6) History of asthma - on Advair as an outpatient and uses her albuterol several times a week (3x) - thereby indicating that she is not at baseline controlled of her asthma. She has no PFTs that I am able to locate in Epic  - Dosage of Advair was increased on 7/19, given the frequency of her baseline albuterol usage.  - While in the hospital we will continue PRN albuterol now   7) Diabetes Mellitus Type II, HA1c 7.6 - noninsulin dependent.  - Continue SSI for now.  - If no cardiac intervention needed, will resume Metformin soon.  - Given age, can accept less tight blood sugar control with a goal A1c of < 8.   8) VTE: Heparin   10) Disposition - deferred at this time, pending improved diuresis and further cardiac workup.  Length of Stay: 4   Signed: Johnette Abraham, Roma Schanz, Internal Medicine Resident Pager: [phone] (7AM-5PM) 03/08/2012, 5:46 PM

## 2012-03-09 LAB — LIPID PANEL
Cholesterol: 115 mg/dL (ref 0–200)
Total CHOL/HDL Ratio: 3.1 RATIO
Triglycerides: 75 mg/dL (ref <150)
VLDL: 15 mg/dL (ref 0–40)

## 2012-03-09 LAB — GLUCOSE, CAPILLARY
Glucose-Capillary: 218 mg/dL — ABNORMAL HIGH (ref 70–99)
Glucose-Capillary: 109 mg/dL — ABNORMAL HIGH (ref 70–99)
Glucose-Capillary: 209 mg/dL — ABNORMAL HIGH (ref 70–99)

## 2012-03-09 MED ORDER — FUROSEMIDE 80 MG PO TABS
80.0000 mg | ORAL_TABLET | Freq: Two times a day (BID) | ORAL | Status: DC
  Administered 2012-03-09: 80 mg via ORAL
  Filled 2012-03-09 (×5): qty 1

## 2012-03-09 MED ORDER — FUROSEMIDE 80 MG PO TABS
80.0000 mg | ORAL_TABLET | Freq: Two times a day (BID) | ORAL | Status: DC
  Filled 2012-03-09 (×3): qty 1

## 2012-03-09 NOTE — Progress Notes (Signed)
Patient ID: Maricruz Lucero, female   DOB: 04-07-1926, 78 y.o.   MRN: [id number] Medical Student Daily Progress Note  Subjective: Patient states she did fine overnight. No shortness of breath, abdominal pain, nausea, or vomiting. Tolerated her diet well this morning. Has been up and walking with PT/OT and that has been going well.  Interval Events: No acute events overnight. Objective: Vital signs in last 24 hours: Filed Vitals:   03/09/12 0722 03/09/12 0836 03/09/12 0838 03/09/12 0909  BP: 135/98 159/84    Pulse: 75     Temp:      TempSrc:      Resp:      Height:      Weight:      SpO2:  87% 94% 96%   Weight change: 2.8 kg (6 lb 2.8 oz)  Intake/Output Summary (Last 24 hours) at 03/09/12 0950 Last data filed at 03/09/12 4098  Gross per 24 hour  Intake   1263 ml  Output   2901 ml  Net  -1638 ml   Physical Exam: General appearance: alert, cooperative and no distress  Back: symmetric, no curvature. ROM normal. No CVA tenderness.  Lungs: clear to auscultation bilaterally  Heart: irregularly irregular rhythm  Abdomen: soft, non-tender; bowel sounds normal; no masses, no organomegaly  Extremities: slight edema on right and 1+ on left  Lab Results: Basic Metabolic Panel:  Lab 03/07/12 1191 03/06/12 0950 03/04/12 1300  NA 142 137 --  K 4.0 4.0 --  CL 102 100 --  CO2 28 25 --  GLUCOSE 144* 169* --  BUN 17 18 --  CREATININE 0.88 0.81 --  CALCIUM 9.0 9.1 --  MG -- -- 1.9  PHOS -- -- --   Liver Function Tests:  Lab 03/07/12 0500 03/04/12 1300  AST 23 18  ALT 7 7  ALKPHOS 67 75  BILITOT 0.6 0.8  PROT 6.4 6.9  ALBUMIN 3.4* 3.7   No results found for this basename: LIPASE:2,AMYLASE:2 in the last 168 hours No results found for this basename: AMMONIA:2 in the last 168 hours CBC:  Lab 03/07/12 0500 03/04/12 1300  WBC 2.7* 3.0*  NEUTROABS -- 1.4*  HGB 11.3* 12.2  HCT 34.6* 37.2  MCV 94.5 93.5  PLT 206 233   Cardiac Enzymes:  Lab 03/05/12 0437 03/04/12 1957 03/04/12  1300  CKTOTAL 89 102 115  CKMB 3.4 3.8 3.9  CKMBINDEX -- -- --  TROPONINI <0.30 <0.30 <0.30   BNP:  Lab 03/04/12 1300  PROBNP 3468.0*   D-Dimer: No results found for this basename: DDIMER:2 in the last 168 hours CBG:  Lab 03/09/12 0601 03/08/12 2125 03/08/12 1638 03/08/12 1113 03/08/12 0620 03/07/12 2119  GLUCAP 196* 197* 116* 146* 132* 190*   Hemoglobin A1C: No results found for this basename: HGBA1C in the last 168 hours Fasting Lipid Panel:  Lab 03/09/12 0525  CHOL 115  HDL 37*  LDLCALC 63  TRIG 75  CHOLHDL 3.1  LDLDIRECT --   Thyroid Function Tests:  Lab 03/04/12 1300  TSH 1.627  T4TOTAL --  FREET4 1.33  T3FREE --  THYROIDAB --   Coagulation: No results found for this basename: LABPROT:4,INR:4 in the last 168 hours Anemia Panel: No results found for this basename: VITAMINB12,FOLATE,FERRITIN,TIBC,IRON,RETICCTPCT in the last 168 hours Urine Drug Screen: Drugs of Abuse  No results found for this basename: labopia, cocainscrnur, labbenz, amphetmu, thcu, labbarb    Alcohol Level: No results found for this basename: ETH:2 in the last 168 hours Urinalysis:  Lab  03/06/12 1112  COLORURINE AMBER*  LABSPEC 1.029  PHURINE 5.5  GLUCOSEU NEGATIVE  HGBUR NEGATIVE  BILIRUBINUR SMALL*  KETONESUR 15*  PROTEINUR 100*  UROBILINOGEN 1.0  NITRITE NEGATIVE  LEUKOCYTESUR SMALL*   Misc. Labs:   Micro Results: Recent Results (from the past 240 hour(s))  URINE CULTURE     Status: Normal   Collection Time   03/07/12 11:12 AM      Component Value Range Status Comment   Specimen Description URINE, CLEAN CATCH   Final    Special Requests ADDED 03/07/12 1325   Final    Culture  Setup Time 03/07/2012 19:13   Final    Colony Count >=100,000 COLONIES/ML   Final    Culture GROUP B STREP(S.AGALACTIAE)ISOLATED   Final    Report Status 03/09/2012 FINAL   Final    Studies/Results: No results found. Medications: I have reviewed the patient's current medications. Scheduled  Meds:   . antiseptic oral rinse  15 mL Mouth Rinse BID  . aspirin  81 mg Oral Daily  . brimonidine  1 drop Left Eye Q12H  . dorzolamide  1 drop Left Eye BID  . Fluticasone-Salmeterol  1 puff Inhalation BID  . furosemide  80 mg Oral BID  . heparin  5,000 Units Subcutaneous Q8H  . insulin aspart  0-9 Units Subcutaneous TID WC  . lisinopril  20 mg Oral Daily  . metoprolol succinate  50 mg Oral Daily  . pantoprazole  40 mg Oral Q1200  . sodium chloride  3 mL Intravenous Q12H  . DISCONTD: furosemide  80 mg Intravenous BID   Continuous Infusions:  PRN Meds:.acetaminophen, albuterol, alum & mag hydroxide-simeth, ondansetron (ZOFRAN) IV, sodium chloride Assessment/Plan: Ms. Regner is a 78 y/o lady with PMH significant for DMII not dependent on insulin, multifocal atrial tachycardia, diastolic CHF, and a history of systolic CHF (25%-35% in 2001) was admitted on 03/04/2012 from an outpatient medical clinic with worsening leg edema and shortness of breath. After admission, echocardiogram confirmed systolic heart failure with an EF of 20-25%. Unknown cause of heart failure but is being worked up by the primary team and Cardiology.   1. Acute on chronic CHF exacerbation- patient had initial 3+ edema bilaterally along with shortness of breath upon presentation to the ED from a medical clinic. Patient's past history indicated an initial EF that would classify her as having systolic heart failure but a following echo showed a normal EF which with symptoms of CHF, would classify her as having diastolic heart failure. Echo for this admission on 03/04/2012 showed EF of 20-25%. The cause of this heart failure is unclear at this time and has not been previously worked up. In the ED acute ischemia was ruled out 2/2 negative cardiac enzymes and normal EKG. Cardiology has been consulted to further work up this acute exacerbation 2/2 what appears to be chronic congestive heart failure.  -Cardiology assistance  appreciated  -Lasix regiment was 80 mg IV BID, will switch to 80mg  Lasix PO  -Continue Lisinopril 20 mg  -Continue Metoprolol 50 mg  -Cardiology is considering possible Myoview on Wednesday  -I/O's, patient was negative 1.5 to 2 liters from yesterday at 0800.   2. Leg Swelling- Patient's chief complaint was bilateral leg swelling. On presentation, that patient had 3+ pitting edema bilaterally. Today the patient has slight edema on the right and 1+ edema on the left. LE dopplers were ordered because of worse edema on the left side and revealed no DVT's only previous ruptured  Bursa Sac. Also, Ms. Amrhein has a history of venous insufficiency on the left which might contribute to the worse edema on that side as compared to the right. Initial Lasix regiment was 40 mg BID but was increased to 80 mg IV BID. Now that patient has been diuresis well, we will switch to PO Lasix. No sign of nephrotic syndrome.  -continue diuresis, baseline weight appears to be around 180 lbs  -continue TED hose   3. Nausea/ abdominal pain- patient denies any tenderness to palpation. Etiologies include UTI, GERD, dyspepsia, hepatitis, and gall bladder etiologies. Hepatitis panel was negative and UA was negative for any signs of a UTI, however urine cultures grew out Group B Strep. Since patient is asymptomatic, treatment is not required. Patient has a history of gallbladder sludge in 2001. Bowel sounds are present. Will prescribe Zofran for a nausea and a PPI for possible GERD/dyspepsia. Patient denies any nausea or abdominal pain since adding PPI.  -Continue Zofran  -Continue PPI  -Will not treat asymptomatic UTI    4. Hypertension- Well controlled on Lisinopril 20 mg and Metoprolol 50 mg.   5. Multifocal atrial tachycardia- Has an irregular rhythm but is not afib.  -Will continue to monitor  -On aspirin 81mg    6. Self report of Asthma- Patient states that she has asthma but there's no record of any PFT's diagnosing the  patient with asthma. Would consider outpatient PFT's to assess lung function. Will have patient ambulate with and without oxygen and measure 02 sat's.  -Continue scheduled Advair  -Continue albuterol PRN -Continue ambulation via nurse and PT/OT   7. Non-insulin dependent DM II- Sugars are ranging from 132-190 which are adequate considering the inpatient setting. Will continue coverage with SSI  -will likely restart Metformin as outpatient pending Cardiac work up   8. Prophy- Heparin   9. Dispo- Pending further diuresis and further cardiac work-up    LOS: 5 days   This is a Psychologist, occupational Note.  The care of the patient was discussed with Dr. Tonny Branch and the assessment and plan formulated with their assistance.  Please see their attached note for official documentation of the daily encounter.  Eather Colas T 03/09/2012, 9:50 AM

## 2012-03-09 NOTE — Care Management Note (Signed)
    Page 1 of 1   03/09/2012     4:42:43 PM   CARE MANAGEMENT NOTE 03/09/2012  Patient:  [name], [name]   Account Number:  [id number]  Date Initiated:  03/09/2012  Documentation initiated by:  Donn Pierini  Subjective/Objective Assessment:   Pt admitted with CHF     Action/Plan:   PTA pt lived at home alone, daughter lives nearby- PT eval   Anticipated DC Date:  03/11/2012   Anticipated DC Plan:  HOME W HOME HEALTH SERVICES      DC Planning Services  CM consult      [facility] Choice  HOME HEALTH   Choice offered to / List presented to:  C-1 Patient        HH arranged  HH-2 PT      Status of service:  In process, will continue to follow Medicare Important Message given?   (If response is "NO", the following Medicare IM given date fields will be blank) Date Medicare IM given:   Date Additional Medicare IM given:    Discharge Disposition:    Per UR Regulation:    If discussed at Long Length of Stay Meetings, dates discussed:    Comments:  03/09/12- 1430- Donn Pierini RN, BSN [phone] Pt with order for HH-PT- spoke with pt at bedside regarding HH needs- per conversation pt states that she has cane and walker at home- she is agreeable to [facility]- states that she has had HH in past but does not remember agency name- list of HH agencies for [facility] left with pt for review with her daughter. CM to f/u in am regarding agency of choice.

## 2012-03-09 NOTE — Progress Notes (Signed)
Internal Medicine Teaching Service Attending Note Date: 03/09/2012  Patient name: [name]  Medical record number: [id number]  Date of birth: 07-18-1926    This patient has been seen and discussed with the house staff. Please see their note for complete details. I concur with their findings with the following additions/corrections: I have reviewed MSIV Locklear's progress note.  Ms Berdan was sitting in chair visiting with her daughter. She has no complaints. Her breathing is good. Her leg edema has decreased. She is walking with PT. She slept well.  Filed Vitals:   03/09/12 0836 03/09/12 0838 03/09/12 0909 03/09/12 1458  BP: 159/84   126/82  Pulse:    66  Temp:    98.2 F (36.8 C)  TempSrc:    Oral  Resp:    18  Height:      Weight:      SpO2: 87% 94% 96% 98%   GEN : NAD, able to speak in full sentences H irr irr  LCTA B with GAM Ext trace edema on R and + 1 on L LE Neuro no focal  Labs reviewed - creatinine stable. LDL 63.  Dr Fabio Bering note reviewed - needs cont diuresis prior to Myoview  A/P. 1. New onset acute systolic cardiomyopathy - pt has diuresed well. Negative 1641 yesterday and 703 today so far. Lasix changed to PO 80 BID. Follow I/O and creatinine. Dr Eden Emms to arrange [facility] when pt is clinically stable. She remains on ACEI, BB.   2. HTN - not well at goal. Cont to adjust anti-HTN.  3. DM II - Pt was on metformin and glipizide at home. Those have been held and is on SSI here. She was well controlled at home. Follow CBG's  4. MAT - rate controlled.  5. Asthma - on dounebs. Outpt PFT's  Alorah Mcree 03/09/2012, 3:40 PM

## 2012-03-09 NOTE — Progress Notes (Signed)
Physical Therapy Treatment Patient Details Name: [name] MRN: [id number] DOB: 24-Jul-1926 Today's Date: 03/09/2012 Time: 4098-1191 PT Time Calculation (min): 22 min  PT Assessment / Plan / Recommendation Comments on Treatment Session  Pt able to increase ambulation distance today.  Ambulates without supplemental 02 but pt with SOB.  Cues for pursed lip breathing- pt with difficulty returning demonstration for breathing technique.     Follow Up Recommendations  Home health PT    Barriers to Discharge        Equipment Recommendations       Recommendations for Other Services    Frequency Min 3X/week   Plan Discharge plan remains appropriate    Precautions / Restrictions Precautions Precautions: Fall Restrictions Weight Bearing Restrictions: No     Pertinent Vitals/Pain 02 sats 95% RA during ambulation 88% RA at rest when returned from room.  Supplemental 02 reapplied.      Mobility  Bed Mobility Bed Mobility: Supine to Sit;Sitting - Scoot to Edge of Bed Supine to Sit: 6: Modified independent (Device/Increase time) Sitting - Scoot to Edge of Bed: 6: Modified independent (Device/Increase time) Transfers Transfers: Sit to Stand;Stand to Sit Sit to Stand: 4: Min guard;With upper extremity assist;From bed;From chair/3-in-1;With armrests Stand to Sit: 4: Min guard;With upper extremity assist;With armrests;To chair/3-in-1 Details for Transfer Assistance: Performed sit<>stand 5x's for strengthening, activity tolerance, & safe technique.   Ambulation/Gait Ambulation/Gait Assistance: 4: Min guard Ambulation Distance (Feet): 120 Feet Assistive device: Rolling walker Ambulation/Gait Assistance Details: Cues for tall posture & reinforcement of proper breathing technique.   Gait Pattern: Decreased stride length;Trunk flexed Stairs: No Wheelchair Mobility Wheelchair Mobility: No      PT Goals Acute Rehab PT Goals Time For Goal Achievement: 03/14/12 Potential to Achieve  Goals: Good PT Goal: Supine/Side to Sit - Progress: Met PT Goal: Sit to Stand - Progress: Progressing toward goal PT Goal: Stand to Sit - Progress: Progressing toward goal PT Goal: Ambulate - Progress: Progressing toward goal  Visit Information  Last PT Received On: 03/09/12 Assistance Needed: +1    Subjective Data      Cognition  Overall Cognitive Status: Appears within functional limits for tasks assessed/performed Arousal/Alertness: Awake/alert Orientation Level: Appears intact for tasks assessed Behavior During Session: [facility] [employer] for tasks performed    Balance  Balance Balance Assessed: No  End of Session PT - End of Session Equipment Utilized During Treatment: Gait belt Activity Tolerance: Patient tolerated treatment well Patient left: in chair;with call bell/phone within reach;with nursing in room Nurse Communication: Mobility status    Verdell Face, [place] [phone] 03/09/2012

## 2012-03-09 NOTE — Progress Notes (Signed)
PROGRESS NOTE  Subjective:   Pt was admitted with worsening dyspnea.  Echo showed new severely reduced LV function with EF of 20-25%.  She has been diuresing well for the past several days.   Objective:    Vital Signs:   Temp:  [97.5 F (36.4 C)-97.9 F (36.6 C)] 97.5 F (36.4 C) (07/23 0532) Pulse Rate:  [70-77] 75  (07/23 0722) Resp:  [18-20] 18  (07/23 0532) BP: (121-159)/(77-109) 159/84 mmHg (07/23 0836) SpO2:  [87 %-99 %] 96 % (07/23 0909) Weight:  [197 lb 1.5 oz (89.4 kg)] 197 lb 1.5 oz (89.4 kg) (07/23 0532)  Last BM Date: 03/07/12   24-hour weight change: Weight change: 6 lb 2.8 oz (2.8 kg)  Weight trends: Filed Weights   03/07/12 0532 03/08/12 0630 03/09/12 0532  Weight: 190 lb 4.1 oz (86.3 kg) 190 lb 14.7 oz (86.6 kg) 197 lb 1.5 oz (89.4 kg)    Intake/Output:  07/22 0701 - 07/23 0700 In: 1260 [P.O.:1260] Out: 2901 [Urine:2900; Stool:1] Total I/O In: 583 [P.O.:580; I.V.:3] Out: 1126 [Urine:1125; Stool:1]   Physical Exam: BP 159/84  Pulse 75  Temp 97.5 F (36.4 C) (Oral)  Resp 18  Ht 5\' 7"  (1.702 m)  Wt 197 lb 1.5 oz (89.4 kg)  BMI 30.87 kg/m2  SpO2 96%  General: Vital signs reviewed and noted. Well-developed, well-nourished, in no acute distress; alert, appropriate and cooperative .  Head: Normocephalic, atraumatic.  Eyes: conjunctivae/corneas clear.  EOM's intact.   Throat: normal  Neck: Supple. Normal carotids. No JVD  Lungs:  Clear to auscultation  Heart: Irregularly irregular,  + systolic murmur  Abdomen:  Soft, non-tender, non-distended with normoactive bowel sounds. No hepatomegaly. No rebound/guarding. No abdominal masses.  Extremities: Distal pedal pulses are 2+ . 2+ pitting edema  Neurologic: A&O X3, CN II - XII are grossly intact. Motor strength is 5/5 in the all 4 extremities.  Psych: Responds to questions appropriately with normal affect.    Labs: BMET:  Basename 03/07/12 0500  NA 142  K 4.0  CL 102  CO2 28  GLUCOSE 144*    BUN 17  CREATININE 0.88  CALCIUM 9.0  MG --  PHOS --    Liver function tests:  Basename 03/07/12 0500  AST 23  ALT 7  ALKPHOS 67  BILITOT 0.6  PROT 6.4  ALBUMIN 3.4*   No results found for this basename: LIPASE:2,AMYLASE:2 in the last 72 hours  CBC:  Basename 03/07/12 0500  WBC 2.7*  NEUTROABS --  HGB 11.3*  HCT 34.6*  MCV 94.5  PLT 206    Cardiac Enzymes: No results found for this basename: CKTOTAL:4,CKMB:4,TROPONINI:4 in the last 72 hours  Coagulation Studies: No results found for this basename: LABPROT:5,INR:5 in the last 72 hours  Other: No components found with this basename: POCBNP:3 No results found for this basename: DDIMER in the last 72 hours No results found for this basename: HGBA1C in the last 72 hours  Basename 03/09/12 0525  CHOL 115  HDL 37*  LDLCALC 63  TRIG 75  CHOLHDL 3.1   No results found for this basename: TSH,T4TOTAL,FREET3,T3FREE,THYROIDAB in the last 72 hours No results found for this basename: VITAMINB12,FOLATE,FERRITIN,TIBC,IRON,RETICCTPCT in the last 72 hours   Tele:  03/09/2012 Sinus rhythm, frequent PACs Medications:    Infusions:    Scheduled Medications:    . antiseptic oral rinse  15 mL Mouth Rinse BID  . aspirin  81 mg Oral Daily  . brimonidine  1 drop Left  Eye Q12H  . dorzolamide  1 drop Left Eye BID  . Fluticasone-Salmeterol  1 puff Inhalation BID  . furosemide  80 mg Oral BID  . heparin  5,000 Units Subcutaneous Q8H  . insulin aspart  0-9 Units Subcutaneous TID WC  . lisinopril  20 mg Oral Daily  . metoprolol succinate  50 mg Oral Daily  . pantoprazole  40 mg Oral Q1200  . sodium chloride  3 mL Intravenous Q12H  . DISCONTD: furosemide  80 mg Intravenous BID  . DISCONTD: furosemide  80 mg Oral BID    Assessment/ Plan:     Acute on chronic systolic heart failure (03/04/2012) Etiology is not clear at this point.  For myoview testing soon,  I think she needs further "tuning "  Continue diuresis.     DIABETES MELLITUS, TYPE II (07/27/2006) Plan per Int. Med  HYPERTENSION (07/27/2006) She will need to avoid salt.   Disposition:  Length of Stay: 5  Vesta Mixer, Montez Hageman., MD, [facility] 03/09/2012, 11:20 AM Office [phone] Pager [phone]

## 2012-03-10 LAB — URINE CULTURE: Colony Count: >=100000

## 2012-03-10 LAB — GLUCOSE, CAPILLARY
Glucose-Capillary: 137 mg/dL — ABNORMAL HIGH (ref 70–99)
Glucose-Capillary: 131 mg/dL — ABNORMAL HIGH (ref 70–99)
Glucose-Capillary: 225 mg/dL — ABNORMAL HIGH (ref 70–99)

## 2012-03-10 MED ORDER — FUROSEMIDE 80 MG PO TABS
120.0000 mg | ORAL_TABLET | Freq: Two times a day (BID) | ORAL | Status: DC
  Administered 2012-03-10 – 2012-03-11 (×2): 120 mg via ORAL
  Filled 2012-03-10 (×4): qty 1

## 2012-03-10 MED ORDER — REGADENOSON 0.4 MG/5ML IV SOLN
0.4000 mg | Freq: Once | INTRAVENOUS | Status: AC
  Administered 2012-03-11: 0.4 mg via INTRAVENOUS
  Filled 2012-03-10: qty 5

## 2012-03-10 MED ORDER — LISINOPRIL 40 MG PO TABS
40.0000 mg | ORAL_TABLET | Freq: Every day | ORAL | Status: DC
  Administered 2012-03-10 – 2012-03-12 (×3): 40 mg via ORAL
  Filled 2012-03-10 (×3): qty 1

## 2012-03-10 MED ORDER — FUROSEMIDE 80 MG PO TABS
80.0000 mg | ORAL_TABLET | Freq: Three times a day (TID) | ORAL | Status: DC
  Filled 2012-03-10 (×3): qty 1

## 2012-03-10 NOTE — Progress Notes (Addendum)
Resident Addendum to Medical Student Note   I have seen and examined the patient, and reviewed the daily progress note by Eather Colas, MS IV and discussed the care of the patient with them. See below for documentation of my findings, assessment, and plans.   Subjective:    Currently, the patient denies chest pain, shortness of breath, difficulty breathing, abdominal pain, nausea, vomiting, diarrhea. Not urinating as well on the oral lasix at current dose.    Interval Events: None.    Objective:    VS: Reviewed as documented in electronic medical record  Meds: Reviewed as documented in electronic medical record  Labs: Reviewed as documented in electronic medical record  Imaging: Reviewed as documented in electronic medical record    Physical Exam: General: Vital signs reviewed and noted. Well-developed, well-nourished, in no acute distress; alert, appropriate and cooperative throughout examination. No JVD appreciated.  Lungs:  Normal respiratory effort. CTA bilaterally, without significant crackles, wheezes appreciated.   Heart: RRR. S1 and S2 normal without gallop, or rubs. (+) Murmur.   Abdomen:  BS normoactive. Soft, Nondistended, non-tender.  No masses or organomegaly.  Extremities: 1-2+ pretibial edema, L > R. (+) TED hose today.     Assessment/ Plan:    Pt is a 78 y.o. yo female with a PMHx of non-insulin dependent DMII, uncontrolled (A1c 7.6), multifocal atrial tachycardia, diastolic CHF, and remove history of systolic CHF (LV EF 25-35% in 1610) of unclear significance who was admitted on 03/04/2012 with symptoms of worsening lower extremity edema. 2D echocardiogram on admission indicates systolic dysfunction with EF of 20-25%. Etiology of the cardiomyopathy remains unclear, and is yet to be elucidated.   1) Acute on chronic systolic CHF exacerbation - patient is noted to have progressively worsening lower extremity edema, shortness of breath on admission, with 2D  echocardiogram confirming LV EF 20-25% which is severely reduced from last echocardiogram in 2003 showing EF of 50-55%. It does not appear that this cardiomyopathy has previously been worked up. Acute ischemia contributing towards systolic dysfunction has been ruled out with negative cardiac enzymes and no new EKG changes.  - Appreciate cardiology assistance and input in the management of our patient.  - Continue Lasix at current - consider to escalate given reduced urinary output.  - Continue Lisinopril at current.  - Toprol-XL was increased in dosage on 7/20 per cardiology.  - Cardiology plans Myoview today - pending result.  2) Bilateral leg swelling - improving. At least partially contributed by #1 in addition to venous insufficiency. Despite asymmetrical edema noted, LE dopplers are negative for DVT, but do show ruptured baker's cyst on left, likely contributing towards asymmetry. No indication of nephrotic syndrome, however, given hx of DMII and HTN, will also evaluate for nephrotic syndrome. Last microalb/ cr was within normal limits. UA showing 100 protein, not consistent with a nephrotic range. - Continue TED hose.    3) Nausea / abdominal pain - Resolved. Possible secondary to gastritis/ GERD. Improved after addition of PPI. Urine culture does show GBS, which can be seen in elderly patients. However, the patient has remained without urinary complaints such as dysuria, hematuria and WBC only 3-6 even though small leukocytes noted). Otherwise, LFTs and lipase unremarkable. - Continue PPI and PRN Maalox today. - Will consider treatment for GBS if symptoms arise. - If increased frequency of loose stools, or worsening leukocytosis, will consider to check C. difficile PCR.   4) Hypertension - Well controlled on current Lisinopril and Metoprolol, and is  tolerating the Lasix regimen.  - Continue current.   5) Multifocal atrial tachycardia: Stable. On admission her pulse was irregular and she was  mildly tachycardic.  - We will continue to monitor with diuresis   6) History of asthma - on Advair as an outpatient and uses her albuterol several times a week (3x) - thereby indicating that she is not at baseline controlled of her asthma. She has no PFTs that I am able to locate in Epic  - Dosage of Advair was increased on 7/19, given the frequency of her baseline albuterol usage.  - Continue PRN albuterol now   7) Diabetes Mellitus Type II, HA1c 7.6 - noninsulin dependent.  - Continue SSI for now.  - Will resume Metformin at least 48 hours after Myoview.  - Given age, can accept less tight blood sugar control with a goal A1c of < 8.   8) VTE: Heparin   10) Disposition - deferred at this time, pending improved diuresis and Myoview today. Will need PT at home. Hopefully home soon after Myoview results obtained.    Length of Stay: 6 days.   Signed: Johnette Abraham, Roma Schanz, Internal Medicine Resident Pager: [phone] (7AM-5PM) 03/10/2012, 7:53 AM      ADDENDUM TO ABOVE: Unfortunately, the Myoview could not be performed today, and therefore, will be deferred until tomorrow. I have placed order for Myoview and Lexiscan to be performed tomorrow. Therefore, pt will need to be made NPO after midnight. She will be presently resumed of her prior diet, and will resume Lasix at prior recently increased dose of 120mg  BID (to improve compliance).   Signed: Johnette Abraham, Roma Schanz, Internal Medicine Resident Pager: [phone] (7AM-5PM) 03/10/2012, 1:12 PM

## 2012-03-10 NOTE — Progress Notes (Signed)
Internal Medicine Teaching Service Attending Note Date: 03/10/2012  Patient name: [name]  Medical record number: [id number]  Date of birth: 1925/10/10    This patient has been seen and discussed with the house staff. Please see their note for complete details. I concur with their findings with the following additions/corrections: Ms Wenk has no complaints today. She still has +2 edema in legs B. Lasix has been increased to TID - to improve compliance at home we will simplify to BID dosing, same total daily dose. Pt states she is able to get up and go to toilet at home without difficulty. For Myoview today.    BUTCHER,ELIZABETH 03/10/2012, 12:15 PM

## 2012-03-10 NOTE — Progress Notes (Signed)
RESIDENT ADDENDUM TO MEDICAL STUDENT NOTE    I have seen and examined the patient, and reviewed the daily progress note by Eather Colas, MS IV and discussed the care of the patient with them. Please see my progress note from 03/10/2012 for further details regarding assessment and plan.    Signed:  Johnette Abraham, Roma Schanz, Internal Medicine Resident  Pager: [phone] (7AM-5PM)  03/10/2012, 12:34 PM

## 2012-03-10 NOTE — Progress Notes (Signed)
Physical Therapy Treatment Patient Details Name: [name] MRN: [id number] DOB: Jun 06, 1926 Today's Date: 03/10/2012 Time: 4098-1191 PT Time Calculation (min): 19 min  PT Assessment / Plan / Recommendation Comments on Treatment Session  Pt cont's to make consistent progress with mobility but she reports feeling weak & she is aware she needs to increase activity.  Encouraged ambulation with Nsing 2-3x's/day with Nsing.  Although pt not mod (I) with all mobilty, she reports her son checks in on her multiple times during the day.      Follow Up Recommendations  Home health PT    Barriers to Discharge        Equipment Recommendations  None recommended by PT    Recommendations for Other Services    Frequency Min 3X/week   Plan Discharge plan remains appropriate    Precautions / Restrictions Precautions Precautions: Fall Restrictions Weight Bearing Restrictions: No     Pertinent Vitals/Pain No pain reported.     Mobility  Bed Mobility Bed Mobility: Supine to Sit;Sitting - Scoot to Edge of Bed Supine to Sit: 6: Modified independent (Device/Increase time) Sitting - Scoot to Edge of Bed: 6: Modified independent (Device/Increase time) Transfers Transfers: Sit to Stand;Stand to Sit Sit to Stand: 5: Supervision;With upper extremity assist;With armrests;From bed;From chair/3-in-1 Stand to Sit: 5: Supervision;With upper extremity assist;With armrests;To chair/3-in-1 Details for Transfer Assistance: reinforcement cues for hand placement.  performed 5x's.  by final trial pt mod (I).   Ambulation/Gait Ambulation/Gait Assistance: 5: Supervision Ambulation Distance (Feet): 140 Feet Assistive device: Rolling walker Ambulation/Gait Assistance Details: cues for tall posture, increased step/stride length, & pursed lip breathing.   Gait Pattern: Decreased stride length;Trunk flexed (decreased floor clearance) Stairs: No Wheelchair Mobility Wheelchair Mobility: No      PT Goals Acute  Rehab PT Goals Time For Goal Achievement: 03/14/12 Potential to Achieve Goals: Good PT Goal: Supine/Side to Sit - Progress: Met PT Goal: Sit to Stand - Progress: Progressing toward goal PT Goal: Stand to Sit - Progress: Progressing toward goal PT Goal: Ambulate - Progress: Progressing toward goal  Visit Information  Last PT Received On: 03/10/12 Assistance Needed: +1    Subjective Data      Cognition  Overall Cognitive Status: Appears within functional limits for tasks assessed/performed Arousal/Alertness: Awake/alert Orientation Level: Appears intact for tasks assessed Behavior During Session: [facility] for tasks performed    Balance     End of Session PT - End of Session Equipment Utilized During Treatment: Gait belt Activity Tolerance: Patient tolerated treatment well;Patient limited by fatigue Patient left: in chair;with call bell/phone within reach Nurse Communication: Mobility status    Verdell Face, [place] [phone] 03/10/2012

## 2012-03-10 NOTE — Progress Notes (Addendum)
Patient ID: Josephina Melcher, female   DOB: 08/27/1925, 78 y.o.   MRN: [id number]   Patient Name: Raylea Adcox Date of Encounter: 03/10/2012    SUBJECTIVE  Sitting in chair. Says she can lay flat for Myoview. Minimal diuresis yesterday. HRand SBP still increased.  CURRENT MEDS    . antiseptic oral rinse  15 mL Mouth Rinse BID  . aspirin  81 mg Oral Daily  . brimonidine  1 drop Left Eye Q12H  . dorzolamide  1 drop Left Eye BID  . Fluticasone-Salmeterol  1 puff Inhalation BID  . furosemide  80 mg Oral BID  . heparin  5,000 Units Subcutaneous Q8H  . insulin aspart  0-9 Units Subcutaneous TID WC  . lisinopril  20 mg Oral Daily  . metoprolol succinate  50 mg Oral Daily  . pantoprazole  40 mg Oral Q1200  . sodium chloride  3 mL Intravenous Q12H  . DISCONTD: furosemide  80 mg Intravenous BID  . DISCONTD: furosemide  80 mg Oral BID    OBJECTIVE  Filed Vitals:   03/09/12 1919 03/09/12 2100 03/10/12 0449 03/10/12 0759  BP:  116/61 154/81   Pulse:  71 82 84  Temp:  98.1 F (36.7 C) 98.1 F (36.7 C)   TempSrc:  Oral Oral   Resp:  18 18 18   Height:      Weight:   195 lb 15.8 oz (88.9 kg)   SpO2: 94% 95% 100% 98%    Intake/Output Summary (Last 24 hours) at 03/10/12 0846 Last data filed at 03/10/12 0453  Gross per 24 hour  Intake   1223 ml  Output   1325 ml  Net   -102 ml   Filed Weights   03/08/12 0630 03/09/12 0532 03/10/12 0449  Weight: 190 lb 14.7 oz (86.6 kg) 197 lb 1.5 oz (89.4 kg) 195 lb 15.8 oz (88.9 kg)    PHYSICAL EXAM  General: Pleasant, NAD. Obese Neuro: Alert and oriented X 3. Moves all extremities spontaneously. Psych: Normal affect. HEENT:  Normal  Neck: Supple without bruits or JVD. Lungs:  Resp regular and unlabored, CTA. Heart: IRRR no s3, s4, or murmurs. Abdomen: Soft, non-tender, non-distended, BS + x 4.  Extremities: No clubbing, cyanosis, 2+ pitting edema, DP/PT/Radials 2+ and equal bilaterally.  Accessory Clinical Findings  CBC No results  found for this basename: WBC:2,NEUTROABS:2,HGB:2,HCT:2,MCV:2,PLT:2 in the last 72 hours Basic Metabolic Panel No results found for this basename: NA:2,K:2,CL:2,CO2:2,GLUCOSE:2,BUN:2,CREATININE:2,CALCIUM:2,MG:2,PHOS:2 in the last 72 hours Liver Function Tests No results found for this basename: AST:2,ALT:2,ALKPHOS:2,BILITOT:2,PROT:2,ALBUMIN:2 in the last 72 hours No results found for this basename: LIPASE:2,AMYLASE:2 in the last 72 hours Cardiac Enzymes No results found for this basename: CKTOTAL:3,CKMB:3,CKMBINDEX:3,TROPONINI:3 in the last 72 hours BNP No components found with this basename: POCBNP:3 D-Dimer No results found for this basename: DDIMER:2 in the last 72 hours Hemoglobin A1C No results found for this basename: HGBA1C in the last 72 hours Fasting Lipid Panel  Basename 03/09/12 0525  CHOL 115  HDL 37*  LDLCALC 63  TRIG 75  CHOLHDL 3.1  LDLDIRECT --   Thyroid Function Tests No results found for this basename: TSH,T4TOTAL,FREET3,T3FREE,THYROIDAB in the last 72 hours  TELE  NSR  ECG    Radiology/Studies  Dg Chest 2 View  03/05/2012  *RADIOLOGY REPORT*  Clinical Data: Heart failure  CHEST - 2 VIEW  Comparison: 02/18/2012  Findings: Enlargement of the cardiac silhouette appears the same. The aorta shows calcification and unfolding.  There are small bilateral pleural effusions with  basilar atelectasis.  There is venous hypertension but no frank edema.  IMPRESSION: Cardiomegaly, venous hypertension, small effusions and mild basilar atelectasis. Since the comparison study of 07/03, there is more pleural fluid.  Original Report Authenticated By: Thomasenia Sales, M.D.   Dg Chest Portable 1 View  02/18/2012  *RADIOLOGY REPORT*  Clinical Data: Bilateral leg swelling, hypertension, diabetes, asthma, shortness of breath  PORTABLE CHEST - 1 VIEW  Comparison: Portable exam 1052 hours compared to 10/21/2010  Findings: Enlargement of cardiac silhouette with pulmonary vascular  congestion. Calcified tortuous aorta. Bibasilar atelectasis. No gross failure or consolidation. Prominent mediastinum likely accentuated by rotation to the right but there appears to be chronic enlargement of the left paratracheal soft tissues question related to thyroid enlargement or mass unchanged since 11/23/2009. Question tiny left pleural effusion. No pneumothorax.  IMPRESSION: Enlargement of cardiac silhouette with pulmonary vascular congestion. Bibasilar atelectasis with question tiny left pleural effusion. Chronic enlargement of left superior mediastinal soft tissues with mass effect upon the trachea, question related to thyroid enlargement/goiter versus mass, unchanged since 2011.  Original Report Authenticated By: Lollie Marrow, M.D.    ASSESSMENT AND PLAN  Principal Problem:  *Acute on chronic systolic heart failure Active Problems:  DIABETES MELLITUS, TYPE II  HYPERTENSION  Irregular heart rhythm  Leg swelling  Shortness of breath  Atrial tachycardia   For myoview today. Will increase lisinopril, check BMET in am. Will also increase lasix to 80mg  po tid today. May need to readdress dose in am.  Signed, Valera Castle MD

## 2012-03-10 NOTE — Progress Notes (Signed)
Patient ID: [name], female   DOB: 1926-02-09, 78 y.o.   MRN: [id number] Medical Student Daily Progress Note  Subjective: No nausea or vomiting. Has a good appetite. Slept on and off last night. No more abdominal pain.  Interval Events: No acute events overnight. Objective: Vital signs in last 24 hours: Filed Vitals:   03/09/12 2100 03/10/12 0449 03/10/12 0759 03/10/12 0930  BP: 116/61 154/81  133/66  Pulse: 71 82 84 80  Temp: 98.1 F (36.7 C) 98.1 F (36.7 C)  97.1 F (36.2 C)  TempSrc: Oral Oral  Oral  Resp: 18 18 18 20   Height:      Weight:  88.9 kg (195 lb 15.8 oz)    SpO2: 95% 100% 98% 100%   Weight change: -0.5 kg (-1 lb 1.6 oz)  Intake/Output Summary (Last 24 hours) at 03/10/12 1150 Last data filed at 03/10/12 1114  Gross per 24 hour  Intake    883 ml  Output    901 ml  Net    -18 ml   Physical Exam: General appearance: alert, cooperative and no distress  Back: symmetric, no curvature. ROM normal. No CVA tenderness.  Lungs: some wheezes bilaterally  Heart: irregularly irregular rhythm  Abdomen: soft, non-tender; bowel sounds normal; no masses, no organomegaly  Extremities: 1+ edema on right and 1+ on left  Lab Results: Basic Metabolic Panel:  Lab 03/07/12 4098 03/06/12 0950 03/04/12 1300  NA 142 137 --  K 4.0 4.0 --  CL 102 100 --  CO2 28 25 --  GLUCOSE 144* 169* --  BUN 17 18 --  CREATININE 0.88 0.81 --  CALCIUM 9.0 9.1 --  MG -- -- 1.9  PHOS -- -- --   Liver Function Tests:  Lab 03/07/12 0500 03/04/12 1300  AST 23 18  ALT 7 7  ALKPHOS 67 75  BILITOT 0.6 0.8  PROT 6.4 6.9  ALBUMIN 3.4* 3.7   No results found for this basename: LIPASE:2,AMYLASE:2 in the last 168 hours No results found for this basename: AMMONIA:2 in the last 168 hours CBC:  Lab 03/07/12 0500 03/04/12 1300  WBC 2.7* 3.0*  NEUTROABS -- 1.4*  HGB 11.3* 12.2  HCT 34.6* 37.2  MCV 94.5 93.5  PLT 206 233   Cardiac Enzymes:  Lab 03/05/12 0437 03/04/12 1957 03/04/12 1300   CKTOTAL 89 102 115  CKMB 3.4 3.8 3.9  CKMBINDEX -- -- --  TROPONINI <0.30 <0.30 <0.30   BNP:  Lab 03/04/12 1300  PROBNP 3468.0*   D-Dimer: No results found for this basename: DDIMER:2 in the last 168 hours CBG:  Lab 03/10/12 1105 03/10/12 0618 03/09/12 2102 03/09/12 1644 03/09/12 1119 03/09/12 0601  GLUCAP 131* 137* 209* 109* 218* 196*   Hemoglobin A1C: No results found for this basename: HGBA1C in the last 168 hours Fasting Lipid Panel:  Lab 03/09/12 0525  CHOL 115  HDL 37*  LDLCALC 63  TRIG 75  CHOLHDL 3.1  LDLDIRECT --   Thyroid Function Tests:  Lab 03/04/12 1300  TSH 1.627  T4TOTAL --  FREET4 1.33  T3FREE --  THYROIDAB --   Coagulation: No results found for this basename: LABPROT:4,INR:4 in the last 168 hours Anemia Panel: No results found for this basename: VITAMINB12,FOLATE,FERRITIN,TIBC,IRON,RETICCTPCT in the last 168 hours Urine Drug Screen: Drugs of Abuse  No results found for this basename: labopia, cocainscrnur, labbenz, amphetmu, thcu, labbarb    Alcohol Level: No results found for this basename: ETH:2 in the last 168 hours Urinalysis:  Lab 03/06/12 1112  COLORURINE AMBER*  LABSPEC 1.029  PHURINE 5.5  GLUCOSEU NEGATIVE  HGBUR NEGATIVE  BILIRUBINUR SMALL*  KETONESUR 15*  PROTEINUR 100*  UROBILINOGEN 1.0  NITRITE NEGATIVE  LEUKOCYTESUR SMALL*   Misc. Labs:   Micro Results: Recent Results (from the past 240 hour(s))  URINE CULTURE     Status: Normal   Collection Time   03/07/12 11:12 AM      Component Value Range Status Comment   Specimen Description URINE, CLEAN CATCH   Final    Special Requests ADDED 03/07/12 1325   Final    Culture  Setup Time 03/07/2012 19:13   Final    Colony Count >=100,000 COLONIES/ML   Final    Culture GROUP B STREP(S.AGALACTIAE)ISOLATED   Final    Report Status 03/09/2012 FINAL   Final    Studies/Results: No results found. Medications: I have reviewed the patient's current medications. Scheduled Meds:    . antiseptic oral rinse  15 mL Mouth Rinse BID  . aspirin  81 mg Oral Daily  . brimonidine  1 drop Left Eye Q12H  . dorzolamide  1 drop Left Eye BID  . Fluticasone-Salmeterol  1 puff Inhalation BID  . heparin  5,000 Units Subcutaneous Q8H  . insulin aspart  0-9 Units Subcutaneous TID WC  . lisinopril  40 mg Oral Daily  . metoprolol succinate  50 mg Oral Daily  . pantoprazole  40 mg Oral Q1200  . sodium chloride  3 mL Intravenous Q12H  . DISCONTD: furosemide  80 mg Oral BID  . DISCONTD: furosemide  80 mg Oral TID  . DISCONTD: lisinopril  20 mg Oral Daily   Continuous Infusions:  PRN Meds:.acetaminophen, albuterol, alum & mag hydroxide-simeth, ondansetron (ZOFRAN) IV, sodium chloride Assessment/Plan: Ms. Pusch is a 78 y/o lady with PMH significant for DMII not dependent on insulin, multifocal atrial tachycardia, diastolic CHF, and a history of systolic CHF (25%-35% in 2001) was admitted on 03/04/2012 from an outpatient medical clinic with worsening leg edema and shortness of breath. After admission, echocardiogram confirmed systolic heart failure with an EF of 20-25%. Unknown cause of heart failure but is being worked up by the primary team and Cardiology.   1. Acute on chronic CHF exacerbation- patient had initial 3+ edema bilaterally along with shortness of breath upon presentation to the ED from a medical clinic. Patient's past history indicated an initial EF that would classify her as having systolic heart failure but a following echo showed a normal EF which with symptoms of CHF, would classify her as having diastolic heart failure. Echo for this admission on 03/04/2012 showed EF of 20-25%. The cause of this heart failure is unclear at this time and has not been previously worked up. In the ED acute ischemia was ruled out 2/2 negative cardiac enzymes and normal EKG. Cardiology has been consulted to further work up this acute exacerbation 2/2 what appears to be chronic congestive heart  failure.  -Cardiology assistance appreciated  -Lasix regiment was 80 mg PO TID, will switch to 120mg  Lasix PO BID for easier dosing  -increase Lisinopril 20 mg to 40 mg  -Continue Metoprolol 50 mg  -Cardiology will be doing a myoview today -I/O's, patient was negative 260 ml from yesterday at 0800.   2. Leg Swelling- Patient's chief complaint was bilateral leg swelling. On presentation, that patient had 3+ pitting edema bilaterally. Today the patient has slight edema on the right and 1+ edema on the left. LE dopplers were ordered  because of worse edema on the left side and revealed no DVT's only previous ruptured Bursa Sac. Also, Ms. Day has a history of venous insufficiency on the left which might contribute to the worse edema on that side as compared to the right. Initial Lasix regiment was 40 mg BID but was increased to 80 mg IV BID and now she is on 120 mg PO BID. No sign of nephrotic syndrome.  -continue diuresis, baseline weight appears to be less than 190 lbs  -continue TED hose   3. Nausea/ abdominal pain- patient denies any tenderness to palpation. Etiologies include UTI, GERD, dyspepsia, hepatitis, and gall bladder etiologies. Hepatitis panel was negative and UA was negative for any signs of a UTI, however urine cultures grew out Group B Strep. Since patient is asymptomatic, treatment is not required. Patient has a history of gallbladder sludge in 2001. Bowel sounds are present. Will prescribe Zofran for a nausea and a PPI for possible GERD/dyspepsia. Patient denies any nausea or abdominal pain since adding PPI.  -Continue Zofran  -Continue PPI  -Will not treat asymptomatic UTI   4. Hypertension-  on Lisinopril 40 mg and Metoprolol 50 mg.   5. Multifocal atrial tachycardia- Has an irregular rhythm but is not afib.  -Will continue to monitor  -On aspirin 81mg    6. Self report of Asthma- Patient states that she has asthma but there's no record of any PFT's diagnosing the patient  with asthma. Would consider outpatient PFT's to assess lung function. Will have patient ambulate with and without oxygen and measure 02 sat's.  -Continue scheduled Advair  -Continue albuterol PRN  -Continue ambulation via nurse and PT/OT   7. Non-insulin dependent DM II- Sugars are ranging from 130's to 190 which are adequate considering the inpatient setting. Will continue coverage with SSI  -will likely restart Metformin as outpatient pending Cardiac work up   8. Prophy- Heparin   9. Dispo- Pending further diuresis and further cardiac work-up. Will need home PT.   LOS: 6 days   This is a Psychologist, occupational Note.  The care of the patient was discussed with Dr. Saralyn Pilar and the assessment and plan formulated with their assistance.  Please see their attached note for official documentation of the daily encounter.  Eather Colas T 03/10/2012, 11:50 AM

## 2012-03-11 ENCOUNTER — Inpatient Hospital Stay (HOSPITAL_COMMUNITY): Payer: PRIVATE HEALTH INSURANCE

## 2012-03-11 DIAGNOSIS — R079 Chest pain, unspecified: Secondary | ICD-10-CM

## 2012-03-11 DIAGNOSIS — J45909 Unspecified asthma, uncomplicated: Secondary | ICD-10-CM

## 2012-03-11 DIAGNOSIS — E119 Type 2 diabetes mellitus without complications: Secondary | ICD-10-CM

## 2012-03-11 DIAGNOSIS — I10 Essential (primary) hypertension: Secondary | ICD-10-CM

## 2012-03-11 DIAGNOSIS — E876 Hypokalemia: Secondary | ICD-10-CM

## 2012-03-11 LAB — BASIC METABOLIC PANEL
Chloride: 99 mEq/L (ref 96–112)
Creatinine, Ser: 0.69 mg/dL (ref 0.50–1.10)
GFR calc Af Amer: >90 mL/min (ref >90)
Sodium: 144 mEq/L (ref 135–145)

## 2012-03-11 LAB — GLUCOSE, CAPILLARY
Glucose-Capillary: 134 mg/dL — ABNORMAL HIGH (ref 70–99)
Glucose-Capillary: 127 mg/dL — ABNORMAL HIGH (ref 70–99)
Glucose-Capillary: 165 mg/dL — ABNORMAL HIGH (ref 70–99)
Glucose-Capillary: 193 mg/dL — ABNORMAL HIGH (ref 70–99)

## 2012-03-11 MED ORDER — OMEPRAZOLE 20 MG PO CPDR: 20.0000 mg | DELAYED_RELEASE_CAPSULE | Freq: Every day | ORAL | Status: DC

## 2012-03-11 MED ORDER — POTASSIUM CHLORIDE CRYS ER 20 MEQ PO TBCR
40.0000 meq | EXTENDED_RELEASE_TABLET | ORAL | Status: AC
  Administered 2012-03-11 (×2): 40 meq via ORAL
  Filled 2012-03-11 (×2): qty 2

## 2012-03-11 MED ORDER — POTASSIUM CHLORIDE ER 10 MEQ PO CPCR: 40.0000 meq | ORAL_CAPSULE | Freq: Every day | ORAL | Status: DC

## 2012-03-11 MED ORDER — POTASSIUM CHLORIDE CRYS ER 20 MEQ PO TBCR
40.0000 meq | EXTENDED_RELEASE_TABLET | ORAL | Status: DC
  Administered 2012-03-11: 40 meq via ORAL
  Filled 2012-03-11: qty 2

## 2012-03-11 MED ORDER — TECHNETIUM TC 99M TETROFOSMIN IV KIT
10.0000 | PACK | Freq: Once | INTRAVENOUS | Status: AC | PRN
  Administered 2012-03-11: 10 via INTRAVENOUS

## 2012-03-11 MED ORDER — LISINOPRIL 40 MG PO TABS: 40.0000 mg | ORAL_TABLET | Freq: Every day | ORAL | Status: DC

## 2012-03-11 MED ORDER — POTASSIUM CHLORIDE CRYS ER 20 MEQ PO TBCR: 40.0000 meq | EXTENDED_RELEASE_TABLET | Freq: Once | ORAL | Status: DC

## 2012-03-11 MED ORDER — FUROSEMIDE 40 MG PO TABS: 80.0000 mg | ORAL_TABLET | Freq: Two times a day (BID) | ORAL | Status: DC

## 2012-03-11 MED ORDER — TECHNETIUM TC 99M TETROFOSMIN IV KIT
30.0000 | PACK | Freq: Once | INTRAVENOUS | Status: AC | PRN
  Administered 2012-03-11: 30 via INTRAVENOUS

## 2012-03-11 MED ORDER — FUROSEMIDE 80 MG PO TABS
80.0000 mg | ORAL_TABLET | Freq: Two times a day (BID) | ORAL | Status: DC
  Administered 2012-03-11 – 2012-03-12 (×2): 80 mg via ORAL
  Filled 2012-03-11 (×4): qty 1

## 2012-03-11 MED ORDER — REGADENOSON 0.4 MG/5ML IV SOLN
INTRAVENOUS | Status: AC | PRN
  Administered 2012-03-11: 0.4 mg via INTRAVENOUS

## 2012-03-11 MED ORDER — METFORMIN HCL 1000 MG PO TABS: 1000.0000 mg | ORAL_TABLET | Freq: Two times a day (BID) | ORAL | Status: DC

## 2012-03-11 MED ORDER — POTASSIUM CHLORIDE CRYS ER 20 MEQ PO TBCR: 40.0000 meq | EXTENDED_RELEASE_TABLET | ORAL | Status: DC

## 2012-03-11 MED ORDER — FLUTICASONE-SALMETEROL 250-50 MCG/DOSE IN AEPB: 1.0000 | INHALATION_SPRAY | Freq: Two times a day (BID) | RESPIRATORY_TRACT | Status: DC

## 2012-03-11 MED ORDER — METOPROLOL SUCCINATE ER 50 MG PO TB24
75.0000 mg | ORAL_TABLET | Freq: Every day | ORAL | Status: DC
  Administered 2012-03-11: 25 mg via ORAL
  Administered 2012-03-12: 75 mg via ORAL
  Filled 2012-03-11 (×2): qty 1

## 2012-03-11 MED ORDER — METOPROLOL SUCCINATE ER 25 MG PO TB24: 75.0000 mg | ORAL_TABLET | Freq: Every day | ORAL | Status: DC

## 2012-03-11 NOTE — Progress Notes (Signed)
Internal Medicine Teaching Service Attending Note Date: 03/11/2012  Patient name: [name]  Medical record number: [id number]  Date of birth: 1926/02/29    This patient has been seen and discussed with the house staff. Please see their note for complete details. I concur with their findings with the following additions/corrections:  Pt had no issues with her myoview. Pt had trouble sleeping 2/2 polyuria. She thinks her legs have gone down and is happy that she can feel her R shin. Hasn't used O2 past few days. No dyspnea.  Soc Hx : lives with daughter who is there all the time. Knows about home PT referral  Filed Vitals:   03/11/12 1022 03/11/12 1025 03/11/12 1027 03/11/12 1343  BP: 151/82 151/84 152/85 137/82  Pulse: 99 91 105 102  Temp:    98.6 F (37 C)  TempSrc:    Oral  Resp:    18  Height:      Weight:      SpO2:    98%   Gen : sitting in recliner. NAD. Speaking in full sentences Ext : + 1 edema LLE , trace on R  Creatinine normal at 0.69, K low at 2.9  A/P 1. New onset acute systolic cardionyopathy - on ACEI, BB, Lasix. Ischemic W/UI (myoview) P. Await results. Diuresing well - net negative 4 L yesterday. Will cont lasix as outpt but will need close F/U. Explained that we cannot remove all fluid from her LE - just try to help control it. Consider stockings  2. HTN - will need cont monitoring and med adjustment as an outpt.   3. Asthma - Cont advair and alb  4. Dispo - in AM if myoview is without ischemia.   BUTCHER,ELIZABETH 03/11/2012, 3:51 PM

## 2012-03-11 NOTE — ED Notes (Signed)
Chest pressure and shortness of breath resolved.  Mild dizziness remaining

## 2012-03-11 NOTE — Progress Notes (Addendum)
Lexiscan Myoview done. Pt K+ 2.9, frequent ectopy on tele with PVCs, PACs and pairs. Will make sure she gets the second KDUR 40 meq this am.

## 2012-03-11 NOTE — ED Notes (Signed)
Start of exam

## 2012-03-11 NOTE — Clinical Documentation Improvement (Addendum)
Abnormal Labs Clarification  THIS DOCUMENT IS NOT A PERMANENT PART OF THE MEDICAL RECORD  Please update your documentation within the medical record to reflect your response to this query.                                                                                    03/11/12  Dr. Rogelia Boga and/or Associates,  Abnormal findings (laboratory, x-ray, pathologic, and other diagnostic results) are not coded and reported unless the physician indicates their clinical significance.     The medical record reflects the following clinical findings:  03/11/12 ==> THIS WILL BE ADDRESSED TODAY, PROGRESS NOTE IS NOT COMPLETE BECAUSE PT IS IN A PROCEDURE.  K+ 2.9 Treated with po K+ Serial Chemistries  03/10/12  Urine Cx.  ==> THIS IS NOT CLINICALLY SIGNIFICANT - THIS HAS ALREADY BEEN ADDRESSED IN MY NOTE, PLEASE READ THE SECTION DETAINING THIS. >=100,000 COLONIES/ML GROUP B STREP(S.AGALACTIAE)ISOLATED Note: TESTING AGAINST S. AGALACTIAE NOT ROUTINELY PERFORMED DUE TO PREDICTABILITY OF AMP/PEN/VAN SUSCEPTIBILITY       Based on your clinical judgment, please document the clinical conditions associated with the abnormal results and associated treatment in the progress notes and discharge summary.    In responding to this query please exercise your independent judgment.    The fact that a query is asked, does not imply that any particular answer is desired or expected.   Reviewed:  no additional documentation provided - Johnette Abraham, D.O., 12:53 PM, 03/11/2012    Thank You,  Jerral Ralph  RN BSN CCDS Certified Clinical Documentation Specialist: Cell   [phone]  Health Information Management [place]   TO RESPOND TO THE THIS QUERY, FOLLOW THE INSTRUCTIONS BELOW:  If needed, update documentation for the patient's encounter via the notes activity.  Access this query again and click edit on the [employer].  After updating, or not, click F2 to complete all  highlighted (required) fields concerning your review. Select "additional documentation in the medical record" OR "no additional documentation provided".  Click Sign note button.  The deficiency will fall out of your InBasket *Please let us know if you are not able to complete this workflow by phone or e-mail (listed below).

## 2012-03-11 NOTE — Progress Notes (Signed)
   Subjective: No chest pain or dyspnea   Objective: Blood pressure 152/84, pulse 82, temperature 98 F (36.7 C), temperature source Oral, resp. rate 20, height 5\' 7"  (1.702 m), weight 85.276 kg (188 lb), SpO2 95.00%.  Intake/Output Summary (Last 24 hours) at 03/11/12 0800 Last data filed at 03/11/12 0700  Gross per 24 hour  Intake    460 ml  Output   3703 ml  Net  -3243 ml    Telemetry - Sinus rhythm, NSVT  Exam -   General - NAD.   Lungs - CTA  Cardiac - RRR  Extremities - 1+ edema.  Lab Results -  Basic Metabolic Panel:  Lab 03/11/12 1610 03/07/12 0500 03/06/12 0950 03/04/12 1300  NA 144 142 137 --  K 2.9* 4.0 4.0 --  CL 99 102 100 --  CO2 36* 28 25 --  GLUCOSE 140* 144* 169* --  BUN 12 17 18  --  CREATININE 0.69 0.88 0.81 --  CALCIUM 8.7 9.0 9.1 --  MG -- -- -- 1.9    Liver Function Tests:  Lab 03/07/12 0500 03/04/12 1300  AST 23 18  ALT 7 7  ALKPHOS 67 75  BILITOT 0.6 0.8  PROT 6.4 6.9  ALBUMIN 3.4* 3.7    CBC:  Lab 03/07/12 0500 03/04/12 1300  WBC 2.7* 3.0*  HGB 11.3* 12.2  HCT 34.6* 37.2  MCV 94.5 93.5  PLT 206 233    Cardiac Enzymes:  Lab 03/05/12 0437 03/04/12 1957 03/04/12 1300  CKTOTAL 89 102 115  CKMB 3.4 3.8 3.9  CKMBINDEX -- -- --  TROPONINI <0.30 <0.30 <0.30    Current Medications    . antiseptic oral rinse  15 mL Mouth Rinse BID  . aspirin  81 mg Oral Daily  . brimonidine  1 drop Left Eye Q12H  . dorzolamide  1 drop Left Eye BID  . Fluticasone-Salmeterol  1 puff Inhalation BID  . furosemide  120 mg Oral BID  . heparin  5,000 Units Subcutaneous Q8H  . insulin aspart  0-9 Units Subcutaneous TID WC  . lisinopril  40 mg Oral Daily  . metoprolol succinate  50 mg Oral Daily  . pantoprazole  40 mg Oral Q1200  . potassium chloride  40 mEq Oral Q4H  . regadenoson  0.4 mg Intravenous Once  . sodium chloride  3 mL Intravenous Q12H  . DISCONTD: furosemide  80 mg Oral BID  . DISCONTD: furosemide  80 mg Oral TID  .  DISCONTD: lisinopril  20 mg Oral Daily    Assessment:  1. A/C systolic CHF, LVEF now 20% with moderate pulmonary HTN. DIffuse HK with normal troponins. No clear h/o IHD or infarct. Medical therapy being adjusted. Gentle diuresis continues. Still evidence of volume overload. Continue present dose of lasix; supplement K. Follow renal function.  2. Palpitations with documented ectopic atrial tachycardia on monitor; now in sinus. Wonder if she has possible tachycardia-mediated CM. Continue beta blocker; increase toprol to 75 mg po daily.  3. Asthma.   4. HTN.   5. DM2.  Plan:  Continue lasix other cardiac medications. BMET AM. Plan Myoview to assess for ischemia (scheduled for today per patient).  Olga Millers M.D., F.A.C.C. 8:00 AM

## 2012-03-11 NOTE — ED Notes (Signed)
Lexiscan given IV

## 2012-03-11 NOTE — ED Notes (Signed)
C/O mild shortness of breath, dizziness and chest pressure

## 2012-03-11 NOTE — ED Notes (Signed)
Dizziness resolved

## 2012-03-11 NOTE — Progress Notes (Signed)
HOME HEALTH AGENCIES SERVING [place]   Agencies that are Medicare-Certified and are affiliated with [facility]  Telephone Number Address  [facility]   [facility] has ownership interest in this company; however, you are under no obligation to use this agency. [phone] or  [phone] [address] [place], [place] [address]   Agencies that are Medicare-Certified and are not affiliated with [facility] Agency Telephone Number Address  [facility] [phone] Fax [phone] [address] [place], [place]  [address]  [facility] [phone] or [phone] Fax [phone] [address] [place], [place] [address]  [facility] [phone] Fax [phone] [address] [place], [place] [address]  [facility] [phone] Fax [phone] [address] [place], [place]  [address]  [facility] [phone] Fax [phone] [address] [place], [place] [address]  [facility] [phone] [address] [place], [place] [address]  [facility] [phone]  [address] [place], [place] [address]  [facility] [phone] or [phone] Fax [phone] [address] [place], [place]  [address]  [facility] [phone] Fax [phone] [address] [place], [place]  [address]  [facility]  [phone] Fax [phone] [address] [place], [place] [address]               Agencies that are not Medicare-Certified and are not affiliated with [facility] Agency Telephone Number Address  [facility] [place] [phone] or [phone] Fax [phone] [address] [place], [place]  [address]  [facility] [phone] Fax [phone] [address] [place], [place]  [address]  [facility]  [phone] Fax [phone] [address] [place], [place] [address]  [facility] [phone] Fax [phone] [address] [place], [place] [address]  [facility] [phone] or [phone] Fax [phone] [address] [place], [place]  [address]  [facility] [phone] or [phone] Fax [phone] [address], [address] [place], [place]  [address]  [facility] [phone] Fax [phone] [address] [place], [place]  [address]  [facility] [phone] [address] [place], [place]  [address]  [facility] [phone] Fax [phone] [address] [place], [place]  [address]  [facility] [phone] Fax [phone] [address] [place], [place]  [address]  [facility] [phone] Fax [phone] [address] [place], [place] [address]  [facility] [phone] Fax [phone] [address] [place], [place]  [address]  In to speak with patient regarding discharge to home with home health RN and PT. Choice of agencies had been  given to patient on 03/09/12. [facility] afternoon for services. Debbie,RN with [facility] notified of referral.

## 2012-03-11 NOTE — ED Notes (Signed)
Stress test completed without adverse effects or complications

## 2012-03-11 NOTE — Progress Notes (Signed)
RESIDENT INTERIM PROGRESS NOTE   I have been unable to see the patient today, as she has been in her Myoview, however, my attending Dr. Rogelia Boga, will be seeing the patient. I spoke with Dr. Jens Som, [facility], regarding discharge planning. He suggested that ONLY IF Myoview is negative, would the patient be ok for discharge for today. He recommended to decrease the Lasix to 80 mg BID, so as to hopefully avoid overdiuresis. He also recommended follow-up BMET in 1 week - which we have already planned during her follow-up clinic appt on 03/17/2012.   I have completed the discharge prescriptions in anticipation - However, will obviously not input the discharge instructions until results of Myoview are obtained and it is determined that the patient is safe for discharge.   Cardiology and PCP follow-up have been arranged, and Dr. Daleen Squibb will ultimately be her primary cardiologist for long-term care.    Signed:  Johnette Abraham, Roma Schanz, Internal Medicine Resident  Pager: [phone] (7AM-5PM)  03/11/2012, 1:13 PM

## 2012-03-11 NOTE — Progress Notes (Signed)
Pt's cbg=127 this am and due to get novolog,  pt has been npo since 0001 for a stress test this am. Dr.  Burtis Junes  Made aware, she said it was ok not to give insulin coverage this am.----Diantha Paxson, Avin Gibbons, rn

## 2012-03-11 NOTE — Progress Notes (Deleted)
In speaking with patient about why she was readmitted the following is noted: Conservation officer, historic buildings monitors weights and the [employer] has provided a weight machine that sends in daily weights and she is called if the weight is not received. She has no trouble getting her medications and takes them as ordered. She has had fluid build up even with medications and had went to [place] office for SOB for which they increased Lasix which did not seem to help, thus, she was admitted for diuresing. Last two months this patient has needed two pillows to sleep on. Base weight is 189.  Increase in weight took two weeks per patient.  Weight on admission 89.9 kg.  CHF education reinforced.  Thanks, [employer]

## 2012-03-11 NOTE — ED Notes (Signed)
R Barrett, PA at bedside for start and throughout stress test

## 2012-03-11 NOTE — Progress Notes (Signed)
Patient ID: [name], female   DOB: 1925/09/10, 78 y.o.   MRN: [id number] Medical Student Daily Progress Note  Subjective: Patient states that she did not sleep well because she went to the bathroom twice. No abdominal pain, nausea, vomiting. States she is breathing well without the nasal cannula. The myoview test went ok, had some dizzyness.  Interval Events: Did not receive myoview yesterday, received it today. Was a little dizzy during test but denies any dizzyness at the moment. Objective: Vital signs in last 24 hours: Filed Vitals:   03/10/12 1400 03/10/12 2033 03/10/12 2100 03/11/12 0623  BP: 138/63  123/73 152/84  Pulse: 95  80 82  Temp: 98.1 F (36.7 C)  98.4 F (36.9 C) 98 F (36.7 C)  TempSrc: Oral  Oral Oral  Resp: 20  20 20   Height:      Weight:    85.276 kg (188 lb)  SpO2: 95% 94% 91% 95%   Weight change: -3.624 kg (-7 lb 15.8 oz)  Intake/Output Summary (Last 24 hours) at 03/11/12 0909 Last data filed at 03/11/12 0829  Gross per 24 hour  Intake    460 ml  Output   4453 ml  Net  -3993 ml   Physical Exam: General appearance: alert, cooperative and no distress  Back: symmetric, no curvature. ROM normal. No CVA tenderness.  Lungs: slight wheezes bilaterally Heart: irregularly irregular rhythm  Abdomen: soft, non-tender; bowel sounds normal; no masses, no organomegaly  Extremities: 1+ edema on right and 1+ on left  Lab Results: Basic Metabolic Panel:  Lab 03/11/12 7829 03/07/12 0500 03/04/12 1300  NA 144 142 --  K 2.9* 4.0 --  CL 99 102 --  CO2 36* 28 --  GLUCOSE 140* 144* --  BUN 12 17 --  CREATININE 0.69 0.88 --  CALCIUM 8.7 9.0 --  MG 1.8 -- 1.9  PHOS -- -- --   Liver Function Tests:  Lab 03/07/12 0500 03/04/12 1300  AST 23 18  ALT 7 7  ALKPHOS 67 75  BILITOT 0.6 0.8  PROT 6.4 6.9  ALBUMIN 3.4* 3.7   No results found for this basename: LIPASE:2,AMYLASE:2 in the last 168 hours No results found for this basename: AMMONIA:2 in the last 168  hours CBC:  Lab 03/07/12 0500 03/04/12 1300  WBC 2.7* 3.0*  NEUTROABS -- 1.4*  HGB 11.3* 12.2  HCT 34.6* 37.2  MCV 94.5 93.5  PLT 206 233   Cardiac Enzymes:  Lab 03/05/12 0437 03/04/12 1957 03/04/12 1300  CKTOTAL 89 102 115  CKMB 3.4 3.8 3.9  CKMBINDEX -- -- --  TROPONINI <0.30 <0.30 <0.30   BNP:  Lab 03/04/12 1300  PROBNP 3468.0*   D-Dimer: No results found for this basename: DDIMER:2 in the last 168 hours CBG:  Lab 03/11/12 0639 03/10/12 2148 03/10/12 1626 03/10/12 1105 03/10/12 0618 03/09/12 2102  GLUCAP 127* 134* 225* 131* 137* 209*   Hemoglobin A1C: No results found for this basename: HGBA1C in the last 168 hours Fasting Lipid Panel:  Lab 03/09/12 0525  CHOL 115  HDL 37*  LDLCALC 63  TRIG 75  CHOLHDL 3.1  LDLDIRECT --   Thyroid Function Tests:  Lab 03/04/12 1300  TSH 1.627  T4TOTAL --  FREET4 1.33  T3FREE --  THYROIDAB --   Coagulation: No results found for this basename: LABPROT:4,INR:4 in the last 168 hours Anemia Panel: No results found for this basename: VITAMINB12,FOLATE,FERRITIN,TIBC,IRON,RETICCTPCT in the last 168 hours Urine Drug Screen: Drugs of Abuse  No  results found for this basename: labopia, cocainscrnur, labbenz, amphetmu, thcu, labbarb    Alcohol Level: No results found for this basename: ETH:2 in the last 168 hours Urinalysis:  Lab 03/06/12 1112  COLORURINE AMBER*  LABSPEC 1.029  PHURINE 5.5  GLUCOSEU NEGATIVE  HGBUR NEGATIVE  BILIRUBINUR SMALL*  KETONESUR 15*  PROTEINUR 100*  UROBILINOGEN 1.0  NITRITE NEGATIVE  LEUKOCYTESUR SMALL*   Misc. Labs:   Micro Results: Recent Results (from the past 240 hour(s))  URINE CULTURE     Status: Normal   Collection Time   03/07/12 11:12 AM      Component Value Range Status Comment   Specimen Description URINE, CLEAN CATCH   Final    Special Requests ADDED 03/07/12 1325   Final    Culture  Setup Time 03/07/2012 19:13   Final    Colony Count >=100,000 COLONIES/ML   Final     Culture     Final    Value: GROUP B STREP(S.AGALACTIAE)ISOLATED     Note: TESTING AGAINST S. AGALACTIAE NOT ROUTINELY PERFORMED DUE TO PREDICTABILITY OF AMP/PEN/VAN SUSCEPTIBILITY.   Report Status 03/10/2012 FINAL   Final    Studies/Results: No results found. Medications: I have reviewed the patient's current medications. Scheduled Meds:   . antiseptic oral rinse  15 mL Mouth Rinse BID  . aspirin  81 mg Oral Daily  . brimonidine  1 drop Left Eye Q12H  . dorzolamide  1 drop Left Eye BID  . Fluticasone-Salmeterol  1 puff Inhalation BID  . furosemide  120 mg Oral BID  . heparin  5,000 Units Subcutaneous Q8H  . insulin aspart  0-9 Units Subcutaneous TID WC  . lisinopril  40 mg Oral Daily  . metoprolol succinate  75 mg Oral Daily  . pantoprazole  40 mg Oral Q1200  . potassium chloride  40 mEq Oral Q4H  . regadenoson  0.4 mg Intravenous Once  . sodium chloride  3 mL Intravenous Q12H  . DISCONTD: furosemide  80 mg Oral TID  . DISCONTD: metoprolol succinate  50 mg Oral Daily  . DISCONTD: potassium chloride  40 mEq Oral Q4H  . DISCONTD: potassium chloride  40 mEq Oral Once   Continuous Infusions:  PRN Meds:.acetaminophen, albuterol, alum & mag hydroxide-simeth, ondansetron (ZOFRAN) IV, sodium chloride Assessment/Plan: [name] is a 78 y/o lady with PMH significant for DMII not dependent on insulin, multifocal atrial tachycardia, diastolic CHF, and a history of systolic CHF (25%-35% in 2001) was admitted on 03/04/2012 from an outpatient medical clinic with worsening leg edema and shortness of breath. After admission, echocardiogram confirmed systolic heart failure with an EF of 20-25%. Unknown cause of heart failure but is being worked up by the primary team and Cardiology.   1. Acute on chronic CHF exacerbation- patient had initial 3+ edema bilaterally along with shortness of breath upon presentation to the ED from a medical clinic. Patient's past history indicated an initial EF that would  classify her as having systolic heart failure but a following echo showed a normal EF which with symptoms of CHF, would classify her as having diastolic heart failure. Echo for this admission on 03/04/2012 showed EF of 20-25%. The cause of this heart failure is unclear at this time and has not been previously worked up. In the ED acute ischemia was ruled out 2/2 negative cardiac enzymes and normal EKG. Cardiology has been consulted to further work up this acute exacerbation 2/2 what appears to be chronic congestive heart failure.  -Cardiology assistance  appreciated  -Lasix regiment was 80 mg PO TID, was switched to 120mg  Lasix PO BID yesterday. Cardiology recommended decreasing Lasix to 80 mg BID  -continue Lisinopril 40 mg  -will increase Metoprolol from 50 mg to 75 mg  -Cardiology will be doing a myoview today  -I/O's, patient was negative 2.5 L from yesterday -will replace K+   2. Leg Swelling- Patient's chief complaint was bilateral leg swelling. On presentation, that patient had 3+ pitting edema bilaterally. Today the patient has slight edema on the right and 1+ edema on the left. LE dopplers were ordered because of worse edema on the left side and revealed no DVT's only previous ruptured Bursa Sac. Also, [name] has a history of venous insufficiency on the left which might contribute to the worse edema on that side as compared to the right. Lasix regiment now is 80 mg BID. No sign of nephrotic syndrome.  -continue diuresis regiment on 80 mg BID -continue TED hose   3. Nausea/ abdominal pain- patient denies any tenderness to palpation. Etiologies include UTI, GERD, dyspepsia, hepatitis, and gall bladder etiologies. Hepatitis panel was negative  however urine cultures grew out Group B Strep. Since patient is asymptomatic, treatment is not required. Patient has a history of gallbladder sludge in 2001. Bowel sounds are present. On Zofran for nausea and a PPI for possible GERD/dyspepsia. Patient  denies any nausea or abdominal pain since adding PPI.  -Continue Zofran  -Continue PPI  -Will not treat asymptomatic UTI   4. Hypertension- on Lisinopril 40 mg and will increase Metoprolol to 75 mg.   5. Multifocal atrial tachycardia- Has an irregular rhythm but is not afib. Rate controled. -Will continue to monitor  -On aspirin 81mg    6. Self report of Asthma- Patient states that she has asthma but there's no record of any PFT's diagnosing the patient with asthma. Would consider outpatient PFT's to assess lung function. Will have patient ambulate with and without oxygen and measure 02 sat's.  -Continue scheduled Advair  -Continue albuterol PRN  -Continue ambulation via nurse and PT/OT   7. Non-insulin dependent DM II- Sugars are ranging from 130's to 190 which are adequate considering the inpatient setting. Will continue coverage with SSI  -will likely restart home regiment on discharge   8. Prophy- Heparin   9. Dispo- Pending myoview results. Will need home PT. Consider OT consult. Has walker and bed side commode at home. Maybe home today depending on myoview results.    LOS: 7 days   This is a Psychologist, occupational Note.  The care of the patient was discussed with Dr. Rogelia Boga and the assessment and plan formulated with their assistance.  Please see their attached note for official documentation of the daily encounter.  Eather Colas T 03/11/2012, 9:09 AM

## 2012-03-11 NOTE — Progress Notes (Signed)
PT Cancel Note:   PT session cancelled due to pt just returning back to room from stress test & lunch arriving.    Verdell Face, [place] [phone] 03/11/2012

## 2012-03-12 LAB — GLUCOSE, CAPILLARY
Glucose-Capillary: 143 mg/dL — ABNORMAL HIGH (ref 70–99)
Glucose-Capillary: 196 mg/dL — ABNORMAL HIGH (ref 70–99)

## 2012-03-12 LAB — BASIC METABOLIC PANEL
CO2: 36 mEq/L — ABNORMAL HIGH (ref 19–32)
Calcium: 8.9 mg/dL (ref 8.4–10.5)
Chloride: 102 mEq/L (ref 96–112)
Creatinine, Ser: 0.74 mg/dL (ref 0.50–1.10)
GFR calc non Af Amer: 76 mL/min — ABNORMAL LOW (ref >90)
Potassium: 3.5 mEq/L (ref 3.5–5.1)

## 2012-03-12 MED ORDER — POTASSIUM CHLORIDE CRYS ER 20 MEQ PO TBCR
40.0000 meq | EXTENDED_RELEASE_TABLET | Freq: Once | ORAL | Status: AC
  Administered 2012-03-12: 40 meq via ORAL
  Filled 2012-03-12: qty 2

## 2012-03-12 NOTE — Progress Notes (Signed)
Medical Student Daily Progress Note  Subjective: Patient slept well yesterday. No nausea, vomiting, abdominal pain, shortness of breath, or chest pain.  Interval Events: Patient had myoview yesterday which was abnormal. Objective: Vital signs in last 24 hours: Filed Vitals:   03/11/12 2141 03/12/12 0527 03/12/12 0832 03/12/12 0900  BP: 113/84 124/84  115/70  Pulse: 82 81  82  Temp: 98.2 F (36.8 C) 98.2 F (36.8 C)  97.6 F (36.4 C)  TempSrc: Oral Oral  Oral  Resp: 18 18  18   Height:      Weight:  80.559 kg (177 lb 9.6 oz)    SpO2: 98% 100% 95% 98%   Weight change: -4.717 kg (-10 lb 6.4 oz)  Intake/Output Summary (Last 24 hours) at 03/12/12 1250 Last data filed at 03/12/12 1100  Gross per 24 hour  Intake    923 ml  Output   2150 ml  Net  -1227 ml   Physical Exam: General appearance: alert, cooperative and no distress  Back: symmetric, no curvature. ROM normal. No CVA tenderness.  Lungs: CTA bilaterally  Heart: irregularly irregular rhythm  Abdomen: soft, non-tender; bowel sounds normal; no masses, no organomegaly  Extremities: trace edema on right and 1+ on left  Lab Results: Basic Metabolic Panel:  Lab 03/12/12 0960 03/11/12 0514  NA 144 144  K 3.5 2.9*  CL 102 99  CO2 36* 36*  GLUCOSE 144* 140*  BUN 11 12  CREATININE 0.74 0.69  CALCIUM 8.9 8.7  MG -- 1.8  PHOS -- --   Liver Function Tests:  Lab 03/07/12 0500  AST 23  ALT 7  ALKPHOS 67  BILITOT 0.6  PROT 6.4  ALBUMIN 3.4*   No results found for this basename: LIPASE:2,AMYLASE:2 in the last 168 hours No results found for this basename: AMMONIA:2 in the last 168 hours CBC:  Lab 03/07/12 0500  WBC 2.7*  NEUTROABS --  HGB 11.3*  HCT 34.6*  MCV 94.5  PLT 206   Cardiac Enzymes: No results found for this basename: CKTOTAL:3,CKMB:3,CKMBINDEX:3,TROPONINI:3 in the last 168 hours BNP: No results found for this basename: PROBNP:3 in the last 168 hours D-Dimer: No results found for this basename:  DDIMER:2 in the last 168 hours CBG:  Lab 03/12/12 1113 03/12/12 0619 03/11/12 2140 03/11/12 1608 03/11/12 1406 03/11/12 0639  GLUCAP 196* 143* 187* 193* 165* 127*   Hemoglobin A1C: No results found for this basename: HGBA1C in the last 168 hours Fasting Lipid Panel:  Lab 03/09/12 0525  CHOL 115  HDL 37*  LDLCALC 63  TRIG 75  CHOLHDL 3.1  LDLDIRECT --   Thyroid Function Tests: No results found for this basename: TSH,T4TOTAL,FREET4,T3FREE,THYROIDAB in the last 168 hours Coagulation: No results found for this basename: LABPROT:4,INR:4 in the last 168 hours Anemia Panel: No results found for this basename: VITAMINB12,FOLATE,FERRITIN,TIBC,IRON,RETICCTPCT in the last 168 hours Urine Drug Screen: Drugs of Abuse  No results found for this basename: labopia, cocainscrnur, labbenz, amphetmu, thcu, labbarb    Alcohol Level: No results found for this basename: ETH:2 in the last 168 hours Urinalysis:  Lab 03/06/12 1112  COLORURINE AMBER*  LABSPEC 1.029  PHURINE 5.5  GLUCOSEU NEGATIVE  HGBUR NEGATIVE  BILIRUBINUR SMALL*  KETONESUR 15*  PROTEINUR 100*  UROBILINOGEN 1.0  NITRITE NEGATIVE  LEUKOCYTESUR SMALL*   Misc. Labs:   Micro Results: Recent Results (from the past 240 hour(s))  URINE CULTURE     Status: Normal   Collection Time   03/07/12 11:12 AM  Component Value Range Status Comment   Specimen Description URINE, CLEAN CATCH   Final    Special Requests ADDED 03/07/12 1325   Final    Culture  Setup Time 03/07/2012 19:13   Final    Colony Count >=100,000 COLONIES/ML   Final    Culture     Final    Value: GROUP B STREP(S.AGALACTIAE)ISOLATED     Note: TESTING AGAINST S. AGALACTIAE NOT ROUTINELY PERFORMED DUE TO PREDICTABILITY OF AMP/PEN/VAN SUSCEPTIBILITY.   Report Status 03/10/2012 FINAL   Final    Studies/Results: Nm Myocar Multi W/spect W/wall Motion / Ef  03/11/2012  *RADIOLOGY REPORT*  Clinical Data:  chest pain, CHF  MYOCARDIAL IMAGING WITH SPECT (REST AND  PHARMACOLOGIC-STRESS) GATED LEFT VENTRICULAR WALL MOTION STUDY LEFT VENTRICULAR EJECTION FRACTION  Technique:  Standard myocardial SPECT imaging was performed after resting intravenous injection of 10 mCi Tc-55m tetrofosmin. Subsequently, intravenous infusion of regadenoson was performed under the supervision of the Cardiology staff.  At peak effect of the drug, 30 mCi Tc-77m tetrofosmin was injected intravenously and standard myocardial SPECT  imaging was performed.  Quantitative gated imaging was also performed to evaluate left ventricular wall motion, and estimate left ventricular ejection fraction.  Comparison:  None.  Findings: Lexiscan stress ECG showed PACs but no significant ST changes from baseline.  This is reported elsewhere in the record. The LV was significantly dilated.  On gated images, there was global hypokinesis with EF 17%.  On perfusion images, there was was medium-sized, moderate basal to mid inferolateral perfusion defect seen both at rest and with stress (primarily fixed).  There was a small, mild mid-anteroseptal perfusion defect seen only with stress (reversible).  IMPRESSION: 1. EF 17% with dilated LV and global hypokinesis.  2. Primarily fixed basal to mid inferolateral perfusion defect could represent prior infarction with minimal peri-infarct ischemia.  The reversible, mild mid-anteroseptal perfusion defect may be attenuation from shifting breast artifact but cannot rule out ischemia.  3. The above noted perfusion defects do not seem profound enough to have caused the degree of cardiomyopathy that is present.  Original Report Authenticated By: [email or web address]   Medications: I have reviewed the patient's current medications. Scheduled Meds:   . antiseptic oral rinse  15 mL Mouth Rinse BID  . aspirin  81 mg Oral Daily  . brimonidine  1 drop Left Eye Q12H  . dorzolamide  1 drop Left Eye BID  . Fluticasone-Salmeterol  1 puff Inhalation BID  . furosemide  80 mg Oral BID  . heparin  5,000  Units Subcutaneous Q8H  . insulin aspart  0-9 Units Subcutaneous TID WC  . lisinopril  40 mg Oral Daily  . metoprolol succinate  75 mg Oral Daily  . pantoprazole  40 mg Oral Q1200  . potassium chloride  40 mEq Oral Q4H  . potassium chloride  40 mEq Oral Once  . sodium chloride  3 mL Intravenous Q12H  . DISCONTD: furosemide  120 mg Oral BID   Continuous Infusions:  PRN Meds:.acetaminophen, albuterol, alum & mag hydroxide-simeth, ondansetron (ZOFRAN) IV, sodium chloride Assessment/Plan: Ms. Rhudy is a 78 y/o lady with PMH significant for DMII not dependent on insulin, multifocal atrial tachycardia, diastolic CHF, and a history of systolic CHF (25%-35% in 2001) was admitted on 03/04/2012 from an outpatient medical clinic with worsening leg edema and shortness of breath. After admission, echocardiogram confirmed systolic heart failure with an EF of 20-25%.  1. Acute on chronic CHF exacerbation- patient had initial 3+ edema bilaterally along  with shortness of breath upon presentation to the ED from a medical clinic. Patient's past history indicated an initial EF that would classify her as having systolic heart failure but a following echo showed a normal EF which with symptoms of CHF, would classify her as having diastolic heart failure. Echo for this admission on 03/04/2012 showed EF of 20-25%. The cause of this heart failure is unclear at this time and has not been previously worked up. In the ED acute ischemia was ruled out 2/2 negative cardiac enzymes and normal EKG. Cardiology has performed a myoview which shows global hypokinesis and some perfusion defects that might represent some prior ischemia. -Cardiology assistance appreciated  -Lasix regiment is 80 mg BID -continue Lisinopril 40 mg  -continue metoprolol 75 mg  -I/O's, patient was negative 1.1 L from yesterday  - K+ is normal   2. Leg Swelling- Patient's chief complaint was bilateral leg swelling. On presentation, the patient had 3+  pitting edema bilaterally. Today the patient has slight edema on the right and 1+ edema on the left. LE dopplers were ordered because of worse edema on the left side and revealed no DVT's only previous ruptured Bursa Sac. Also, Ms. Grupp has a history of venous insufficiency on the left which might contribute to the worse edema on that side as compared to the right. Lasix regiment now is 80 mg BID. No sign of nephrotic syndrome.  -continue diuresis regiment on 80 mg BID  -continue TED hose   3. Nausea/ abdominal pain- patient denies any tenderness to palpation. Etiologies include UTI, GERD, dyspepsia, hepatitis, and gall bladder etiologies. Hepatitis panel was negative however urine cultures grew out Group B Strep. Since patient is asymptomatic, treatment is not required. Patient has a history of gallbladder sludge in 2001. Bowel sounds are present. On Zofran for nausea and a PPI for possible GERD/dyspepsia. Patient denies any nausea or abdominal pain since adding PPI.  -Continue Zofran  -Continue PPI  -Will not treat asymptomatic UTI   4. Hypertension- on Lisinopril 40 mg and Metoprolol 75 mg.   5. Multifocal atrial tachycardia- Has an irregular rhythm but is not afib. Rate controled.  -Will continue to monitor  -On aspirin 81mg    6. Self report of Asthma- Patient states that she has asthma but there's no record of any PFT's diagnosing the patient with asthma. Would consider outpatient PFT's to assess lung function. Will have patient ambulate with and without oxygen and measure 02 sat's.  -Continue scheduled Advair  -Continue albuterol PRN  -Continue ambulation via nurse and PT/OT   7. Non-insulin dependent DM II- Sugars are ranging from 130's to 190 which are adequate considering the inpatient setting. Will continue coverage with SSI  -will likely restart home regiment on discharge   8. Prophy- Heparin   9. Dispo-  Will need home PT. Has walker and bed side commode at home. Cardiology has  recommended medical management so we will optimize her regiment and d/c her home today.     LOS: 8 days   This is a Psychologist, occupational Note.  The care of the patient was discussed with Dr. Saralyn Pilar and the assessment and plan formulated with their assistance.  Please see their attached note for official documentation of the daily encounter.  Eather Colas T 03/12/2012, 12:50 PM

## 2012-03-12 NOTE — Progress Notes (Signed)
Pt d/c to home. D/c instructions and medications reviewed with PT > pt questions answered. Pt states understanding

## 2012-03-12 NOTE — Progress Notes (Signed)
Internal Medicine Teaching Service Attending Note Date: 03/12/2012  Patient name: [name]  Medical record number: [id number]  Date of birth: [birth year]    This patient has been seen and discussed with the house staff. Please see their note for complete details. I concur with their findings with the following additions/corrections: Ms Bilello is feeling well today. Lungs are clear. Trace edema on R & L LE. Dr Saralyn Pilar discussed Myoview with cards and med mgmt only. Pt is on BB, ACEI, and ASA. Close F/U as outpt.   BUTCHER,ELIZABETH 03/12/2012, 1:13 PM

## 2012-03-12 NOTE — Progress Notes (Signed)
PT Cancellation Note  Treatment cancelled today due to patient's refusal to participate.  Patient prepared for discharge.  Patient declined stair instruction.  Vena Austria 03/12/2012, 4:09 PM [phone]

## 2012-03-12 NOTE — Progress Notes (Signed)
Resident Addendum to Medical Student Note   I have seen and examined the patient, and reviewed the daily progress note by Eather Colas, MS IV and discussed the care of the patient with them. See below for documentation of my findings, assessment, and plans.   Subjective:    Currently, the patient states she is feeling well. She has no shortness of breath, difficulty breathing, chest pain, nausea, vomiting, diarrhea, fever, chills. States she tolerated the myoview well yesterday without resultant chest pain, shortness of breath.    Interval Events: Myoview performed 07/25, with abnormal results.   Objective:    VS: Reviewed as documented in electronic medical record  Meds: Reviewed as documented in electronic medical record  Labs: Reviewed as documented in electronic medical record  Imaging: Reviewed as documented in electronic medical record    Physical Exam: General: Vital signs reviewed and noted. Well-developed, well-nourished, in no acute distress; alert, appropriate and cooperative throughout examination. No JVD appreciated.  Lungs:  Normal respiratory effort. CTA bilaterally, without significant crackles, wheezes appreciated.   Heart: RRR. S1 and S2 normal without gallop, or rubs. (+) Murmur.   Abdomen:  BS normoactive. Soft, Nondistended, non-tender.  No masses or organomegaly.  Extremities: Trace right pretibial edema, persistent 1-2+ Lt pretibial edema. (-) TED hose today.     Assessment/ Plan:    Pt is a 78 y.o. yo female with a PMHx of non-insulin dependent DMII, uncontrolled (A1c 7.6), multifocal atrial tachycardia, diastolic CHF, and remove history of systolic CHF (LV EF 25-35% in 4010) of unclear significance who was admitted on 03/04/2012 with symptoms of worsening lower extremity edema. 2D echocardiogram on admission indicates systolic dysfunction with EF of 20-25%. Etiology of the cardiomyopathy remains unclear, and is yet to be elucidated.   1) Acute on  chronic systolic CHF exacerbation - Volume status stable at this time. Patient is noted to have progressively worsening lower extremity edema, shortness of breath on admission, with 2D echocardiogram confirming LV EF 20-25% which is severely reduced from last echocardiogram in 2003 showing EF of 50-55%. This cardiomyopathy was not previously worked up. CE negative x 3. Myoview performed on 07/25 was abnormal showing  EF 17% with dilated LV and global hypokinesis. Also showed primarily fixed basal to mid inferolateral perfusion defect could represent prior infarction with minimal peri-infarct ischemia.  The reversible, mild mid-anteroseptal perfusion defect may be attenuation from shifting breast artifact but cannot rule out ischemia.  - Appreciate cardiology assistance and input in the management of our patient.  - Continue Lasix, Lisinopril, and Toprol-XL at current dosing. - Follow-up with cardiology regarding further inpatient workup.  2) Bilateral leg swelling - improving. At least partially contributed by #1 in addition to venous insufficiency. Despite asymmetrical edema noted, LE dopplers are negative for DVT, but do show ruptured baker's cyst on left, likely contributing towards asymmetry. No indication of nephrotic syndrome, however, given hx of DMII and HTN, will also evaluate for nephrotic syndrome. Last microalb/ cr was within normal limits. UA showing 100 protein, not consistent with a nephrotic range. - Continue TED hose.    3) Nausea / abdominal pain - Resolved. Possible secondary to gastritis/ GERD. Improved after addition of PPI. Urine culture does show GBS, which can be seen in elderly patients. However, the patient has remained without urinary complaints such as dysuria, hematuria and WBC only 3-6 even though small leukocytes noted). Otherwise, LFTs and lipase unremarkable. - Continue PPI and PRN Maalox today. - Will consider treatment for GBS  if symptoms arise. - If increased frequency  of loose stools, or worsening leukocytosis, will consider to check C. difficile PCR.   4) Hypokalemia - Resolved after repletion. Noted to have hypokalemia with K 2.9 on  07/25 secondary to high doses of lasix, was treated with KDur, and is stable this AM.  - Will add daily KDur while on this elevated dosage of lasix.  5) Multifocal atrial tachycardia: Stable. On admission her pulse was irregular and she was mildly tachycardic. Also thought possible contributer to #1. - We will continue to monitor with diuresis and with escalations of beta-blocker.  6) History of asthma - on Advair as an outpatient and uses her albuterol several times a week (3x) - thereby indicating that she is not at baseline controlled of her asthma. She has no PFTs that I am able to locate in Epic  - Dosage of Advair was increased on 7/19, given the frequency of her baseline albuterol usage.  - Continue PRN albuterol now   7) Diabetes Mellitus Type II, HA1c 7.6 - noninsulin dependent.  - Continue SSI for now.  - Will resume Metformin at least 48 hours after Myoview (on 03/13/2012).  - Given age, can accept less tight blood sugar control with a goal A1c of < 8.   8) Hypertension - Well controlled on current Lisinopril and Metoprolol, and is tolerating the Lasix regimen.  - Continue current.   9) VTE: Heparin   10) Disposition - deferred at this time. Will need PT at home. Hopefully home soon after follow-up with cardiology.   Length of Stay: 8 days.   Signed: Johnette Abraham, Roma Schanz, Internal Medicine Resident Pager: [phone] (7AM-5PM) 03/12/2012, 9:24 AM

## 2012-03-12 NOTE — Progress Notes (Signed)
RESIDENT ADDENDUM TO MEDICAL STUDENT NOTE    I have seen and examined the patient, and reviewed the daily progress note by Eather Colas, MS IV and discussed the care of the patient with them. Please see my progress note from 03/12/2012 for further details regarding assessment and plan.    Signed:  Johnette Abraham, Roma Schanz, Internal Medicine Resident  Pager: [phone] (7AM-5PM)  03/12/2012, 1:07 PM

## 2012-03-12 NOTE — Discharge Summary (Signed)
Patient Name:  [name] MRN: [id number]  PCP: Lars Mage, MD DOB:  [birth year]       Date of Admission:  03/04/2012  Date of Discharge:  03/12/2012      Attending Physician: Dr. Bonnetta Barry att. providers found         DISCHARGE DIAGNOSES: 1. Acute on chronic systolic CHF exacerbation - 2-D echocardiogram (03/04/2012) showed LV EF 20-25%. This was likely the cause of the patient's progressively worsening shortness breath and lower extremity edema. Provided IV diuresis with Lasix, which was then transitioned to oral. Net -8.7 L during hospital course. Escalated of her home ACE-I, BB, and lasix regimen. Myoview performed (03/11/2012) with abnormal results, see below for details. She was not started on statin, as her LDL was 63. 2. Bilateral leg swelling - improving, Multifactorial, contributed by #1 in addition to venous insufficiency, and ruptured Baker's cyst left. LE dopplers are negative for DVT 3. Nausea / abdominal pain - Resolved. Possibly secondary to gastritis/ GERD as her symptoms improved significantly after initiation of PPI. Urine culture grew group B strep, however, without urinary symptoms - will need to follow-up outpt if not improved. 4. Hypokalemia - secondary to lasix, repleted. Normal by discharge. To go home on potassium supplementation. 5. Multifocal atrial tachycardia - Stable. Escalated her beta blocker therapy to Toprol-XL 75 mg daily. 6. Asthma - without acute exacerbation. However, had prior uncontrolled baseline asthma with albuterol usage 2-3 times weekly. Was escalated of her home Advair dosage. Likely needs outpatient PFTs to further classify.   7. Diabetes Mellitus Type II, HA1c 7.6 - noninsulin dependent. Metformin held 2/2 need for Myoview, can be resumed on 03/13/2012.  8. Hypertension - overall regimen significantly escalated - needs outpatient monitoring.    DISPOSITION AND FOLLOW-UP: [name] is to follow-up with the listed providers as detailed below, at  which time, the following should be addressed:   1. Internal medicine:  Acute on chronic CHF exacerbation - please assess volume status, she was increased of her home lasix significantly (from prior 40mg  to 80mg  BID). Check BMET, assess stability of renal function, and check potassium to see if the KDur is appropriate.  Abdominal pain - please assess for continued resolution. If persistent, consider recheck UA, ask about symptoms, and possibly treat for UTI if appropriate, as her UCx did show GBS, that was not treated because pt remained without urinary symptoms.   Asthma - Will need outpatient PFTs once pulmonary symptoms stabilize. Please ask about improvement of symptoms with escalation of her Advair (that was done in the hospital course).  DMII - please check CBG, consider goal of < 8 instead of tight blood sugar control, if determined appropriate, given pt's age.   2. Cardiology  Please follow-up the patient's heart failure, and determine if additional workup is needed.  3. Labs / imaging needed at time of follow-up:   BMET - assess renal function, and K to assess stability with KDur that was prescribed at discharge.  CBG - assess blood sugar control after restarting metformin.  4. Pending labs/ test needing follow-up: None.    DISCHARGE INSTRUCTIONS: Follow-up Information    Follow up with Annett Gula, MD on 03/17/2012. (INTERNAL [facility]ICINE OUTPATIENT CLINIC - You have an appointment on 03/17/2012 at 9:00 AM .)    Contact information:   [address] [place] [address] [phone]       Follow up with Jacolyn Reedy, PA on 03/24/2012. [facility]  C[facility]Y - Your appointment is on 03/24/2012 at 10:00AM --> YOU MUST ARRIVE BY 9:45AM)    Contact information:   [address] [place] [address] [phone]         Discharge Orders    Future Appointments: Provider: Department:  Dept Phone: Center:   03/17/2012 9:00 AM Annett Gula, MD [facility] [phone] [facility]   03/24/2012 10:00 AM Dyann Kief, PA [facility] [phone] [facility]     Future Orders Please Complete By Expires   Diet - low sodium heart healthy      Increase activity slowly      Call MD for:  temperature >100.4      Call MD for:  persistant nausea and vomiting      (HEART FAILURE PATIENTS) Call MD:  Anytime you have any of the following symptoms: 1) 3 pound weight gain in 24 hours or 5 pounds in 1 week 2) shortness of breath, with or without a dry hacking cough 3) swelling in the hands, feet or stomach 4) if you have to sleep on extra pillows at night in order to breathe.      Call MD for:  extreme fatigue      Call MD for:  persistant dizziness or light-headedness      Call MD for:  difficulty breathing, headache or visual disturbances      Call MD for:  severe uncontrolled pain          DISCHARGE MEDICATIONS: Medication List  As of 03/12/2012  2:44 PM   STOP taking these medications         ADVAIR DISKUS 100-50 MCG/DOSE Aepb      lisinopril 20 MG tablet      prednisoLONE acetate 1 % ophthalmic suspension         TAKE these medications         albuterol 108 (90 BASE) MCG/ACT inhaler   Commonly known as: PROVENTIL HFA;VENTOLIN HFA   Inhale 1-2 puffs into the lungs every 6 (six) hours as needed. Shortness of breath      aspirin 81 MG tablet   Take 81 mg by mouth daily.      atropine 1 % ophthalmic solution   Place 1 drop into the left eye every other day.      brimonidine 0.1 % Soln   Commonly known as: ALPHAGAN P   Place 1 drop into the left eye every 12 (twelve) hours.      Fluticasone-Salmeterol 250-50 MCG/DOSE Aepb   Commonly known as: ADVAIR   Inhale 1 puff into the lungs 2 (two) times daily.      furosemide 40 MG tablet   Commonly known as: LASIX   Take 2 tablets (80 mg total) by mouth 2 (two) times daily.      lisinopril 40 MG tablet   Commonly  known as: PRINIVIL,ZESTRIL   Take 1 tablet (40 mg total) by mouth daily.      metFORMIN 1000 MG tablet   Commonly known as: GLUCOPHAGE   Take 1 tablet (1,000 mg total) by mouth 2 (two) times daily with a meal. RESTART ON 03/13/2012.   Start taking on: 03/13/2012      metoprolol succinate 25 MG 24 hr tablet   Commonly known as: TOPROL-XL   Take 3 tablets (75 mg total) by mouth daily.      omeprazole 20 MG capsule   Commonly known as: PRILOSEC  Take 1 capsule (20 mg total) by mouth daily. PLEASE TAKE 30 MINUTES BEFORE YOUR BREAKFAST.      potassium chloride 10 MEQ CR capsule   Commonly known as: MICRO-K   Take 4 capsules (40 mEq total) by mouth daily.      TRUSOPT 2 % ophthalmic solution   Generic drug: dorzolamide   Place 1 drop into the left eye 2 (two) times daily.             CONSULTS:  [facility]   PROCEDURES PERFORMED:   Dg Chest 2 View (03/05/2012) - Cardiomegaly, venous hypertension, small effusions and mild basilar atelectasis. Since the comparison study of 07/03, there is more pleural fluid.  Original Report Authenticated By: Thomasenia Sales, M.D.   Nm Myocar Multi W/spect W/wall Motion / Ef (03/11/2012) - 1. EF 17% with dilated LV and global hypokinesis.  2. Primarily fixed basal to mid inferolateral perfusion defect could represent prior infarction with minimal peri-infarct ischemia.  The reversible, mild mid-anteroseptal perfusion defect may be attenuation from shifting breast artifact but cannot rule out ischemia.  3. The above noted perfusion defects do not seem profound enough to have caused the degree of cardiomyopathy that is present.  Original Report Authenticated By: [email or web address]   Dg Chest Portable 1 View (02/18/2012) - Enlargement of cardiac silhouette with pulmonary vascular congestion. Bibasilar atelectasis with question tiny left pleural effusion. Chronic enlargement of left superior mediastinal soft tissues with mass effect upon the trachea, question  related to thyroid enlargement/goiter versus mass, unchanged since 2011.  Original Report Authenticated By: Lollie Marrow, M.D.  2-D Echocardiogram (03/04/2012)   Left ventricle: Moderate LVH. Systolic function was severely reduced with LV EF in the range of 20% to 25%. No regional wall motion abnormalities.  Aortic valve: Mild regurgitation.  Mitral valve: Moderate regurgitation  Right atrium: The atrium was moderately dilated.   Tricuspid valve: Moderate regurgitation.  Pulmonary arteries: Systolic pressure was moderately increased. PA peak pressure: 56mm Hg (S).  Pericardium, extracardiac: A small to moderate pericardial effusion was identified.   ADMISSION DATA: H&P: Ms. Ashbaugh is a 78 year old woman with a PMH significant for dilated cardiomyopathy, multifocal atrial tachycardia, hypertension, diabetes, grave's disease, hyperlipidemia, and pseudogout who presented to the internal medicine outpatient clinic complaining of worsening bilateral leg swelling over the last 2 months. Over that same period she has noticed that she has gained about 20 lbs without trying as well as becoming more short of breath whenever she does any activity. She denies chest pain, orthopnea, cough, fever, or chills. She was seen in the [facility] on 7/11 and they increased her lasix to 40 mg daily. She states that it did not help and over the week between visits she still gained 8 lbs. She states that she was urinating often and without problems with the medication.   Physical Exam: Vital Signs: Blood pressure 151/94, pulse 97, temperature 99 F (37.2 C), temperature source Oral, resp. rate 20, height 5\' 7"  (1.702 m), weight 198 lb 3.1 oz (89.9 kg), SpO2 96.00%.   Exam: Constitutional: Vital signs reviewed. Patient is a well-developed and well-nourished elderly woman in no acute distress and cooperative with exam. Alert and oriented x3.  Head: Normocephalic and atraumatic  Ear: TM normal bilaterally  Mouth: no  erythema or exudates, MMM  Eyes: PERRL, EOMI, conjunctivae normal, No scleral icterus.  Neck: Supple, JVD to the angle of the jaw. Trachea midline normal ROM, No mass, thyromegaly, or carotid bruit present.  Cardiovascular: irregularly irregular rate. 2/6 systolic murmur best heard at the apex. S1 normal, S2 normal, no RG, pulses symmetric and intact bilaterally  Pulmonary/Chest: mild bibasilar crackles noted on inspiration. Mild upper airway stridor noted. no wheezes, rales, or rhonchi  Abdominal: Soft. Non-tender, non-distended, bowel sounds are normal, no masses, organomegaly, or guarding present.  GU: no CVA tenderness Musculoskeletal: 3+ pitting edema bilaterally with L>R. Right leg edema extends to the knee while left extends to the upper thigh. There is presacral edema. No pain to palpation of the bilateral calves. No joint deformities, erythema, or stiffness, ROM full and no nontender Hematology: no cervical, inginal, or axillary adenopathy.  Neurological: A&O x3, Strength is normal and symmetric bilaterally, cranial nerve II-XII are grossly intact, no focal motor deficit, sensory intact to light touch bilaterally.  Skin: Warm, dry and intact. No rash, cyanosis, or clubbing.  Psychiatric: Normal mood and affect. speech and behavior is normal. Judgment and thought content normal. Cognition and memory are normal.    Labs: Basic Metabolic Panel:  Basename  03/04/12 1300   NA  140   K  3.2*   CL  102   CO2  26   GLUCOSE  187*   BUN  14   CREATININE  0.74   CALCIUM  9.2   MG  1.9   PHOS  --    Liver Function Tests:  Basename  03/04/12 1300   AST  18   ALT  7   ALKPHOS  75   BILITOT  0.8   PROT  6.9   ALBUMIN  3.7    CBC:  Basename  03/04/12 1300   WBC  3.0*   NEUTROABS  1.4*   HGB  12.2   HCT  37.2   MCV  93.5   PLT  233    Cardiac Enzymes:  Basename  03/04/12 1300   CKTOTAL  115   CKMB  3.9   CKMBINDEX  --   TROPONINI  <0.30    BNP:  Basename  03/04/12 1300     PROBNP  3468.0*    CBG:  Basename  03/04/12 0904   GLUCAP  148*      HOSPITAL COURSE: 1. Acute on chronic systolic CHF exacerbation - on admission, the patient described a 2 month history of progressively worsening lower extremity edema, shortness of breath. A 2D echocardiogram was performed on 03/04/2012 and confirmed LV EF 20-25% which is severely reduced from last echocardiogram in 2003 showing EF of 50-55% (and previously actually noted to be 20-25% in 2001 - which was not further investigated at that time). Given the progressive worsening of this patient's cardiomyopathy, [facility] was consulted. The patient was provided with initial IV diuresis with Lasix, which was transitioned to oral Lasix (at a much increased dose from prior 20mg  BID) by the time of discharge, with great diuretic response. Specifically, the patient was able to diurese 8.7 L during hospital course, as well, her shortness of breath resolved, and lower extremity edema significantly improved by the time of discharge. As well, the patient was escalated of her home lisinopril and Toprol-XL. To better understand the cause of this patient's cardiomyopathy, cardiac enzymes were cycled and were negative x3. As well, a Lexiscan Myoview was performed on 03/11/2012. The Myoview showed EF 17% with dilated LV and global hypokinesis. Also, it showed primarily fixed basal to mid inferolateral perfusion defect could represent prior infarction with minimal peri-infarct ischemia. The reversible, mild mid-anteroseptal perfusion defect may be  attenuation from shifting breast artifact but cannot rule out ischemia. I discussed these findings with the cardiologist, who recommended to continue current medical management including aspirin, beta blocker, ACE-I, and lasix. The patient will follow-up closely with outpatient clinic for repeat BMET, and with cardiology for continued monitoring and workup of her cardiomyopathy.  2. Bilateral leg  swelling - improving with right trace edema and left 1-2+ edema by time of discharge. Thought to be multifactorial, contributed by #1 in addition to venous insufficiency, and ruptured Baker's cyst left. Despite asymmetrical edema noted, LE dopplers are negative for DVT, but do show ruptured baker's cyst on left, likely contributing towards asymmetry. No indication of nephrotic syndrome, with last microalb/ cr was within normal limits. UA showing 100 protein. She was maintained on TED hose and was diuresed as described in #1 above.   3. Nausea / abdominal pain - Resolved. On 07/21, the patient described some nausea, and lower quadrant abdominal pain. It is not completely clear the etiology of her pain. However, thought possible secondary to gastritis/ GERD as her symptoms improved significantly after initiation of PPI and PRN Maalox. Otherwise, LFTs and lipase were within normal limits. Urinalysis was checked, and urine culture did ultimately grow group B strep, however, this was thought not to be clinically significant because the patient remained afebrile without symptoms of dysuria, hematuria, malodorous urine.   4. Hypokalemia - on 03/11/2012, the patient was noted to be hypokalemic with K 2.9 secondary to high doses of lasix, was treated with KDur, and is stable this AM. Given that she will require ongoing higher dose Lasix at the time of discharge, she will be continued on daily potassium supplementation. Her renal function and potassium levels will need to be followed as an outpatient, with adjustment of dosage of potassium as indicated.  5. Multifocal atrial tachycardia: Stable. On admission her pulse was irregular and she was mildly tachycardic. This was thought possible contributer to #1. Cardiology service escalated her beta blocker therapy to Toprol-XL 75 mg daily, from 25 mg daily. With this escalation of therapy, the patient remained in normal sinus rhythm with regular rates during hospital  course.  6. Asthma - without acute exacerbation. On admission, the patient was noted to have some wheezing, although in mild. She did not have other indication of acute asthma exacerbation. The patient did however, indicate that at home, she uses her albuterol 2-3 times a week. Therefore, in the setting of uncontrolled baseline asthma, the patient was escalated of her dosages Advair during hospital course. She will be discharged home with this increased dosage. She was maintained on when necessary albuterol. Note, the patient does not have pulmonary function tests in our Epic system, and may benefit from outpatient PFTs after respiratory and cardiac symptoms stabilize.  7. Diabetes Mellitus Type II, HA1c 7.6 - noninsulin dependent. At home, the patient takes metformin. However, during hospital course, with need for Myoview, her metformin was held. It can be resumed on 03/13/2012. She was continued on sliding-scale insulin, with good blood sugar control in the 100s. Ultimately, given the patient's age, I suspect that she can  have a higher blood sugar goal (maintaining hemoglobin A1c less than 8), this can be discussed as an outpatient with her PCP.   8. Hypertension - Well controlled during the hospital course. Of note, her regimen was adjusted significantly during hospital course (prior to admission, the patient was on lisinopril 20mg , Toprol-XL 25mg , lasix 40mg ). During the admission, the patient's lisinopril was increased to  40 mg, Toprol-XL increased to 75 mg, and Lasix increased to 80 mg twice a day. As well, her blood pressures well tolerated these escalations, without resultant hypotension.    DISCHARGE DATA: Vital Signs: BP 113/67  Pulse 90  Temp 97.7 F (36.5 C) (Oral)  Resp 19  Ht 5\' 7"  (1.702 m)  Wt 177 lb 9.6 oz (80.559 kg)  BMI 27.82 kg/m2  SpO2 94%  Labs: Results for orders placed during the hospital encounter of 03/04/12 (from the past 24 hour(s))  GLUCOSE, CAPILLARY     Status:  Abnormal   Collection Time   03/11/12  4:08 PM      Component Value Range   Glucose-Capillary 193 (*) 70 - 99 mg/dL  GLUCOSE, CAPILLARY     Status: Abnormal   Collection Time   03/11/12  9:40 PM      Component Value Range   Glucose-Capillary 187 (*) 70 - 99 mg/dL   Comment 1 Notify RN     Comment 2 Documented in Chart    GLUCOSE, CAPILLARY     Status: Abnormal   Collection Time   03/12/12  6:19 AM      Component Value Range   Glucose-Capillary 143 (*) 70 - 99 mg/dL   Comment 1 Notify RN     Comment 2 Documented in Chart    BASIC METABOLIC PANEL     Status: Abnormal   Collection Time   03/12/12  6:25 AM      Component Value Range   Sodium 144  135 - 145 mEq/L   Potassium 3.5  3.5 - 5.1 mEq/L   Chloride 102  96 - 112 mEq/L   CO2 36 (*) 19 - 32 mEq/L   Glucose, Bld 144 (*) 70 - 99 mg/dL   BUN 11  6 - 23 mg/dL   Creatinine, Ser 1.61  0.50 - 1.10 mg/dL   Calcium 8.9  8.4 - 09.6 mg/dL   GFR calc non Af Amer 76 (*) >90 mL/min   GFR calc Af Amer 88 (*) >90 mL/min  GLUCOSE, CAPILLARY     Status: Abnormal   Collection Time   03/12/12 11:13 AM      Component Value Range   Glucose-Capillary 196 (*) 70 - 99 mg/dL   Comment 1 Notify RN     Comment 2 Documented in Chart       Time spent on discharge: 45 minutes  Signed: Johnette Abraham, DO  PGY III, Internal Medicine Resident 03/12/2012, 2:44 PM

## 2012-03-12 NOTE — Progress Notes (Signed)
   Interim Progress Note   I spoke with Dr. Jens Som, of [facility], regarding the patient's Myoview completed on 07/25. He has had an opportunity to review the study, and feels that medical management of any underlying cardiac disease is most appropriate at this time. As no further cardiac workup will occur during the hospital course, it is okay to discharge the patient home today. He recommended to continue current lasix therapy, have close clinic follow-up for repeat BMET, and to follow-up with cardiology, with Dr. Daleen Squibb ultimately to serve as her primary cardiologist.  I have discussed the plan with Dr. Rogelia Boga as well, and we will plan to discharge the patient home today with home health PT.  Signed: Johnette Abraham, Roma Schanz, Internal Medicine Resident Pager: [phone] (7AM-5PM) 03/12/2012, 1:00 PM

## 2012-03-17 ENCOUNTER — Encounter: Payer: PRIVATE HEALTH INSURANCE | Admitting: Internal Medicine

## 2012-03-24 ENCOUNTER — Encounter: Payer: PRIVATE HEALTH INSURANCE | Admitting: Physician Assistant

## 2012-03-24 ENCOUNTER — Telehealth: Payer: Self-pay | Admitting: Internal Medicine

## 2012-03-24 ENCOUNTER — Other Ambulatory Visit: Payer: Self-pay | Admitting: Internal Medicine

## 2012-03-24 ENCOUNTER — Other Ambulatory Visit (HOSPITAL_COMMUNITY): Payer: Self-pay | Admitting: Internal Medicine

## 2012-03-24 NOTE — Telephone Encounter (Signed)
Ms. Berte had  A Rx refill request today, however, she had missed her post hospitalization appointment and Cardiology appointment today.  I called and spoke with her and she notes that she was not aware of these two appointments.  I discussed with her the need to be seen within the next few weeks to evaluate her for her new medications and possible side effects and to get some blood work.  She expressed understanding.  I have requested that she have an appointment either with her PCP Dr. Eben Burow or within an acute visit slot.

## 2012-03-24 NOTE — Telephone Encounter (Signed)
Called and spoke with [name].  She was not aware that she had a Cardiology appointment this morning and thus was not there.  Will refill her medication for 1 month and attempt to schedule an appointment for her with her PCP within the month for bloodwork and medication evaluation.

## 2012-03-24 NOTE — Telephone Encounter (Signed)
Ms. Pittinger has an appointment currently with Cardiology.  Will defer to that clinic concerning possible changes to her lasix and K-dur dosages.  If Rx are not filled by that clinic at appointment today, will readdress this afternoon.

## 2012-03-29 ENCOUNTER — Telehealth: Payer: Self-pay | Admitting: *Deleted

## 2012-03-29 NOTE — Telephone Encounter (Signed)
Physical therapist calls and ask for 1 f/u visit next week for pt and then home PT will end, permission is given, is this ok w/ you?

## 2012-03-29 NOTE — Telephone Encounter (Signed)
Yes it is ok with me for her to have 1 additional PT visit.  Thank you

## 2012-04-08 ENCOUNTER — Other Ambulatory Visit (HOSPITAL_COMMUNITY): Payer: Self-pay | Admitting: Internal Medicine

## 2012-04-12 ENCOUNTER — Telehealth: Payer: Self-pay | Admitting: *Deleted

## 2012-04-12 ENCOUNTER — Other Ambulatory Visit: Payer: Self-pay | Admitting: Internal Medicine

## 2012-04-12 MED ORDER — FUROSEMIDE 40 MG PO TABS: 80.0000 mg | ORAL_TABLET | Freq: Two times a day (BID) | ORAL | Status: DC

## 2012-04-12 NOTE — Telephone Encounter (Signed)
Patient was discharged on lasix 40mg  tab  2 tabs twice daily (80mg  Lasix twice daily) when discharged from hospital. I have changed the medlist on EPIC to reflect the change.

## 2012-04-12 NOTE — Telephone Encounter (Signed)
Please clarify the disch from hosp med list and correct the chart med list, NP called about furosemide, i spoke to dr Criselda Peaches and she did clarify that med  Thanks, h.

## 2012-04-26 ENCOUNTER — Encounter: Payer: Self-pay | Admitting: Internal Medicine

## 2012-04-26 ENCOUNTER — Ambulatory Visit (INDEPENDENT_AMBULATORY_CARE_PROVIDER_SITE_OTHER): Payer: PRIVATE HEALTH INSURANCE | Admitting: Internal Medicine

## 2012-04-26 VITALS — BP 141/81 | HR 79 | Temp 97.3°F | Wt 178.1 lb

## 2012-04-26 DIAGNOSIS — I498 Other specified cardiac arrhythmias: Secondary | ICD-10-CM

## 2012-04-26 DIAGNOSIS — I428 Other cardiomyopathies: Secondary | ICD-10-CM

## 2012-04-26 DIAGNOSIS — I10 Essential (primary) hypertension: Secondary | ICD-10-CM

## 2012-04-26 DIAGNOSIS — I5023 Acute on chronic systolic (congestive) heart failure: Secondary | ICD-10-CM

## 2012-04-26 DIAGNOSIS — R0602 Shortness of breath: Secondary | ICD-10-CM

## 2012-04-26 DIAGNOSIS — Z299 Encounter for prophylactic measures, unspecified: Secondary | ICD-10-CM

## 2012-04-26 DIAGNOSIS — E119 Type 2 diabetes mellitus without complications: Secondary | ICD-10-CM

## 2012-04-26 DIAGNOSIS — I471 Supraventricular tachycardia: Secondary | ICD-10-CM

## 2012-04-26 DIAGNOSIS — Z79899 Other long term (current) drug therapy: Secondary | ICD-10-CM

## 2012-04-26 LAB — BASIC METABOLIC PANEL WITH GFR
Calcium: 9.5 mg/dL (ref 8.4–10.5)
GFR, Est African American: 86 mL/min
GFR, Est Non African American: 75 mL/min
Sodium: 138 mEq/L (ref 135–145)

## 2012-04-26 LAB — PRO B NATRIURETIC PEPTIDE: Pro B Natriuretic peptide (BNP): 4510 pg/mL — ABNORMAL HIGH (ref <451)

## 2012-04-26 LAB — GLUCOSE, CAPILLARY: Glucose-Capillary: 127 mg/dL — ABNORMAL HIGH (ref 70–99)

## 2012-04-26 MED ORDER — FUROSEMIDE 80 MG PO TABS: 80.0000 mg | ORAL_TABLET | Freq: Two times a day (BID) | ORAL | Status: DC

## 2012-04-26 MED ORDER — POTASSIUM CHLORIDE ER 10 MEQ PO CPCR: 20.0000 meq | ORAL_CAPSULE | Freq: Two times a day (BID) | ORAL | Status: DC

## 2012-04-26 NOTE — Patient Instructions (Signed)
Heart Failure Heart failure (HF) means your heart has trouble pumping blood. The blood is not circulated very well in your body because your heart is weak. HF may cause blood to back up into your lungs. This is commonly called "fluid in the lungs." HF may also cause your ankles and legs to puff up (swell). It is important to take good care of yourself when you have HF. HOME CARE Medicine  Take your medicine as told by your doctor.   Do not stop taking your medicine unless told to by your doctor.   Be sure to get your medicine refilled before it runs out.   Do not skip any doses of medicine.   Tell your doctor if you cannot afford your medicine.   Keep a list of all the medicine you take. This should include the name, how much you take, and when you take it.   Ask your doctor if you have any questions about your medicine. Do not take over-the-counter medicine unless your doctor says it is okay.  What you eat  Do not drink alcohol unless your doctor says it is okay.   Avoid food that is high in fat. Avoid foods fried in oil or made with fat.   Eat a healthy diet. A dietitian can help you with healthy food choices.   Limit how much salt you eat. Do not eat more than 1500 milligrams (mg) of salt (sodium) a day.   Do not add salt to your food.   Do not eat food made with a lot of salt. Here are some examples:   Canned vegetables.   Canned soups.   Canned drinks.   Hot dogs.   Fast food.   Pizza.   Chips.  Check your weight  Weigh yourself every morning. You should do this after you pee (urinate) and before you eat breakfast.   Wear the same amount of clothes each time you weigh yourself.   Write down your weight every day. Tell your doctor if you gain 3 lb/1.4 kg or more in 1 day or 5 lb/2.3 kg in a week.  Blood pressure monitoring  Buy a home blood pressure cuff.   Check your blood pressure as told by your doctor. Write down your blood pressure numbers on a sheet  of paper.   Bring your blood pressure numbers to your doctor visits.  Smoking  Smoking is bad for your heart.   Ask your doctor how to stop smoking.  Exercise  Talk to your doctor about exercise.   Ask how much exercise is right for you.   Exercise as much as you can. Stop if you feel tired, have problems breathing, or have chest pain.  Keep all your doctor appointments. GET HELP RIGHT AWAY IF:   You have trouble breathing.   You have a cough that does not go away.   You cannot sleep because you have trouble breathing.   You gain 3 lb/1.4 kg or more in 1 day or 5 lb/2.3 kg in a week.   You have puffy ankles or legs.   You have an enlarged (bloated) belly (abdomen).   You pass out (faint).   You have really bad chest pain or pressure. This includes pain or pressure in your:   Arms.   Jaw.   Neck.   Back.  If you have any of the above problems, call your local emergency services (911 in [place] Do not drive yourself to the hospital. MAKE   SURE YOU:   Understand these instructions.   Will watch your condition.   Will get help right away if you are not doing well or get worse.  Document Released: 05/13/2008 Document Revised: 07/24/2011 Document Reviewed: 12/05/2008 [employer] Patient Information 2012 [employer]. 

## 2012-04-27 NOTE — Assessment & Plan Note (Signed)
Continue beta blocker at current doses as heart rate is better controlled. We may consider going up to 100 mg dose.

## 2012-04-27 NOTE — Progress Notes (Signed)
  Subjective:    Patient ID: [name], female    DOB: [birth year], 78 y.o.   MRN: [id number]  HPI  [name] is 78 year old female with past medical history most significant for recent admission for acute on chronic systolic congestive heart failure.  This is a hospital followup appointment.  Patient is out of her Lasix and potassium pills and has not taken them for last 2 days. Patient complains of progressive leg swelling for last 2 days. No shortness of breath, orthopnea or PND noted. E. she does have a herself every day and her weight is right what it was at discharge from the hospital.   Blood pressure is also mildly elevated which could be because of the volume overload from lack of Lasix.  No other complaints today.  Review of Systems  Constitutional: Negative for fever, activity change and appetite change.  HENT: Negative for sore throat.   Respiratory: Negative for cough and shortness of breath.   Cardiovascular: Positive for leg swelling. Negative for chest pain.  Gastrointestinal: Negative for nausea, abdominal pain, diarrhea, constipation and abdominal distention.  Genitourinary: Negative for frequency, hematuria and difficulty urinating.  Neurological: Negative for dizziness and headaches.  Psychiatric/Behavioral: Negative for suicidal ideas and behavioral problems.       Objective:   Physical Exam  Constitutional: She is oriented to person, place, and time. She appears well-developed and well-nourished.  HENT:  Head: Normocephalic and atraumatic.  Eyes: Conjunctivae and EOM are normal. Pupils are equal, round, and reactive to light. No scleral icterus.  Neck: Normal range of motion. Neck supple. No JVD present. No thyromegaly present.  Cardiovascular: Normal rate, regular rhythm, normal heart sounds and intact distal pulses.  Exam reveals no gallop and no friction rub.   No murmur heard.      JVD about 8 cm  Pulmonary/Chest: Effort normal and breath sounds normal.  No respiratory distress. She has no wheezes. She has no rales.  Abdominal: Soft. Bowel sounds are normal. She exhibits no distension and no mass. There is no tenderness. There is no rebound and no guarding.  Musculoskeletal: Normal range of motion. She exhibits edema (2+ pitting edema bilaterally left more than right). She exhibits no tenderness.  Lymphadenopathy:    She has no cervical adenopathy.  Neurological: She is alert and oriented to person, place, and time.  Psychiatric: She has a normal mood and affect. Her behavior is normal.          Assessment & Plan:

## 2012-04-27 NOTE — Assessment & Plan Note (Signed)
Patient refused flu vaccine and the zoster vaccine

## 2012-04-27 NOTE — Assessment & Plan Note (Signed)
Patient has chronic systolic congestive heart failure. Patient is on maximal doses of lisinopril and 75 mg metoprolol xr at this time. I believe that patient's accumulation of fluid is secondary to lack of Lasix for last 2 days. I would restart Lasix at 80 mg twice a day. I gave the son and [name] strict instructions on daily weight monitoring.  I would check basic metabolic profile today. I would also check a pro BNP today. Followup in one month.

## 2012-04-27 NOTE — Assessment & Plan Note (Signed)
HbA1c today is 8.1. I would have a liberal goal with [name]. I would not make any changes to the regimen today.

## 2012-05-27 ENCOUNTER — Encounter: Payer: Self-pay | Admitting: Internal Medicine

## 2012-05-27 ENCOUNTER — Ambulatory Visit (INDEPENDENT_AMBULATORY_CARE_PROVIDER_SITE_OTHER): Payer: PRIVATE HEALTH INSURANCE | Admitting: Internal Medicine

## 2012-05-27 VITALS — BP 145/83 | HR 89 | Temp 97.4°F | Wt 170.0 lb

## 2012-05-27 DIAGNOSIS — I5023 Acute on chronic systolic (congestive) heart failure: Secondary | ICD-10-CM

## 2012-05-27 DIAGNOSIS — K219 Gastro-esophageal reflux disease without esophagitis: Secondary | ICD-10-CM

## 2012-05-27 DIAGNOSIS — E119 Type 2 diabetes mellitus without complications: Secondary | ICD-10-CM

## 2012-05-27 LAB — BASIC METABOLIC PANEL WITH GFR
BUN: 12 mg/dL (ref 6–23)
Creat: 0.8 mg/dL (ref 0.50–1.10)
GFR, Est African American: 78 mL/min
GFR, Est Non African American: 68 mL/min

## 2012-05-27 MED ORDER — METOPROLOL SUCCINATE ER 100 MG PO TB24: 100.0000 mg | ORAL_TABLET | Freq: Every day | ORAL | Status: DC

## 2012-05-27 MED ORDER — OMEPRAZOLE 20 MG PO CPDR: 20.0000 mg | DELAYED_RELEASE_CAPSULE | ORAL | Status: DC

## 2012-05-27 MED ORDER — BRIMONIDINE TARTRATE 0.1 % OP SOLN: 1.0000 [drp] | Freq: Two times a day (BID) | OPHTHALMIC | Status: DC

## 2012-05-27 NOTE — Assessment & Plan Note (Signed)
Patient on omeprazole since long time. No symptoms. I will taper it with goal to take it off from med list.

## 2012-05-27 NOTE — Patient Instructions (Signed)
Take omeprazole every other day and then stop once you are done with current prescription. Keep weighing every day and call our office if she gains >5 pounds or if she feels worse.

## 2012-05-27 NOTE — Progress Notes (Signed)
  Subjective:    Patient ID: [name], female    DOB: March 18, 1926, 78 y.o.   MRN: [id number]  HPI  This is a follow up visit for heart failure management. Patient's weight has decreased to her dry weight. She feels her breathing is much better. No orthopnea or PND noted. She is taking all her meds as prescribed. Swelling in her feet has decreased significantly. She is making good amount of urine.  She wants a refil of alphagan. i encouraged her to see her eye doctor.  Foot exam done today.  No health maintenance pending at this time.   Review of Systems  Constitutional: Negative for fever, activity change and appetite change.  HENT: Negative for sore throat.   Respiratory: Negative for cough and shortness of breath.   Cardiovascular: Positive for leg swelling. Negative for chest pain.  Gastrointestinal: Negative for nausea, abdominal pain, diarrhea, constipation and abdominal distention.  Genitourinary: Negative for frequency, hematuria and difficulty urinating.  Neurological: Negative for dizziness and headaches.  Psychiatric/Behavioral: Negative for suicidal ideas and behavioral problems.       Objective:   Physical Exam  Constitutional: She is oriented to person, place, and time. She appears well-developed and well-nourished.  HENT:  Head: Normocephalic and atraumatic.       No JVD noted  Eyes: Conjunctivae normal and EOM are normal. Pupils are equal, round, and reactive to light. No scleral icterus.  Neck: Normal range of motion. Neck supple. No JVD present. No thyromegaly present.  Cardiovascular: Normal rate, regular rhythm, normal heart sounds and intact distal pulses.  Exam reveals no gallop and no friction rub.   No murmur heard. Pulmonary/Chest: Effort normal and breath sounds normal. No respiratory distress. She has no wheezes. She has no rales.  Abdominal: Soft. Bowel sounds are normal. She exhibits no distension and no mass. There is no tenderness. There is no  rebound and no guarding.  Musculoskeletal: Normal range of motion. She exhibits edema (2+ bilateral upto knees). She exhibits no tenderness.  Lymphadenopathy:    She has no cervical adenopathy.  Neurological: She is alert and oriented to person, place, and time.  Psychiatric: She has a normal mood and affect. Her behavior is normal.          Assessment & Plan:

## 2012-05-27 NOTE — Assessment & Plan Note (Signed)
Foot exam today. Slightly above goal HBa1c (<8) Liberal goal for BP given her age(150/90) although we can argue that with CHF this is too high for her. Increase metoprolol today and recheck at next visit.

## 2012-05-27 NOTE — Assessment & Plan Note (Addendum)
Change metoprolol to 100mg  ER daily as HR is 89 yoday. Continue current doses of lasix and lisinopril. Continue to monitor daily weights. Check BMET today for electrolyte abnormalities.

## 2012-05-31 ENCOUNTER — Encounter: Payer: PRIVATE HEALTH INSURANCE | Admitting: Internal Medicine

## 2012-06-01 ENCOUNTER — Encounter: Payer: PRIVATE HEALTH INSURANCE | Admitting: Internal Medicine

## 2012-10-11 ENCOUNTER — Encounter: Payer: Self-pay | Admitting: Internal Medicine

## 2012-10-11 ENCOUNTER — Ambulatory Visit (INDEPENDENT_AMBULATORY_CARE_PROVIDER_SITE_OTHER): Payer: PRIVATE HEALTH INSURANCE | Admitting: Internal Medicine

## 2012-10-11 VITALS — BP 136/82 | HR 68 | Temp 98.4°F | Ht 64.0 in | Wt 183.3 lb

## 2012-10-11 DIAGNOSIS — E119 Type 2 diabetes mellitus without complications: Secondary | ICD-10-CM

## 2012-10-11 DIAGNOSIS — Z79899 Other long term (current) drug therapy: Secondary | ICD-10-CM

## 2012-10-11 DIAGNOSIS — I428 Other cardiomyopathies: Secondary | ICD-10-CM

## 2012-10-11 DIAGNOSIS — I498 Other specified cardiac arrhythmias: Secondary | ICD-10-CM

## 2012-10-11 DIAGNOSIS — I10 Essential (primary) hypertension: Secondary | ICD-10-CM

## 2012-10-11 DIAGNOSIS — Z299 Encounter for prophylactic measures, unspecified: Secondary | ICD-10-CM

## 2012-10-11 DIAGNOSIS — I471 Supraventricular tachycardia: Secondary | ICD-10-CM

## 2012-10-11 LAB — POCT GLYCOSYLATED HEMOGLOBIN (HGB A1C): Hemoglobin A1C: 7.6

## 2012-10-11 LAB — BASIC METABOLIC PANEL WITH GFR
CO2: 31 mEq/L (ref 19–32)
Glucose, Bld: 143 mg/dL — ABNORMAL HIGH (ref 70–99)
Potassium: 3.7 mEq/L (ref 3.5–5.3)
Sodium: 143 mEq/L (ref 135–145)

## 2012-10-11 MED ORDER — METOPROLOL SUCCINATE ER 25 MG PO TB24: 75.0000 mg | ORAL_TABLET | Freq: Every day | ORAL | Status: DC

## 2012-10-11 MED ORDER — PRAVASTATIN SODIUM 40 MG PO TABS: 40.0000 mg | ORAL_TABLET | Freq: Every evening | ORAL | Status: DC

## 2012-10-11 MED ORDER — LISINOPRIL 20 MG PO TABS: ORAL_TABLET | ORAL | Status: DC

## 2012-10-11 NOTE — Assessment & Plan Note (Signed)
Keep metoprolol at current doses.

## 2012-10-11 NOTE — Assessment & Plan Note (Addendum)
The patient's diabetes is controlled at this time with Hbac1 of 7.6. We have liberal goal of HBa1c of 8.

## 2012-10-11 NOTE — Progress Notes (Signed)
  Subjective:    Patient ID: [name], female    DOB: 02/11/1926, 79 y.o.   MRN: [id number]  HPI  Patient is an 79 year old female with past medical history as noted in the chart.  She is complaining of occasional dizziness especially when she stands up. Patient denies any feeling of spinning of head. Her blood pressure today is well-controlled at 136/82.   Patient's heart rate is now much better controlled with metoprolol.  Patient is currently taking 80 mg twice a day of Lasix which has kept her bilateral leg swelling down. She is still taking only 10 mEq twice a day of potassium.  Her diabetes is better controlled with Hba1c of 7.6 today.  Patient requests refills on her medications.   Review of Systems  Constitutional: Negative for fever, activity change and appetite change.  HENT: Negative for sore throat.   Respiratory: Negative for cough and shortness of breath.   Cardiovascular: Negative for chest pain and leg swelling.  Gastrointestinal: Negative for nausea, abdominal pain, diarrhea, constipation and abdominal distention.  Genitourinary: Negative for frequency, hematuria and difficulty urinating.  Neurological: Negative for dizziness and headaches.  Psychiatric/Behavioral: Negative for suicidal ideas and behavioral problems.       Objective:   Physical Exam  Constitutional: She is oriented to person, place, and time. She appears well-developed and well-nourished.  HENT:  Head: Normocephalic and atraumatic.  Eyes: Conjunctivae and EOM are normal. Pupils are equal, round, and reactive to light. No scleral icterus.  Neck: Normal range of motion. Neck supple. No JVD present. No thyromegaly present.  Cardiovascular: Normal rate, regular rhythm, normal heart sounds and intact distal pulses.  Exam reveals no gallop and no friction rub.   No murmur heard. Pulmonary/Chest: Effort normal and breath sounds normal. No respiratory distress. She has no wheezes. She has no rales.   Abdominal: Soft. Bowel sounds are normal. She exhibits no distension and no mass. There is no tenderness. There is no rebound and no guarding.  Musculoskeletal: Normal range of motion. She exhibits edema (1+ pitting edema bilaterally up to knees). She exhibits no tenderness.  Lymphadenopathy:    She has no cervical adenopathy.  Neurological: She is alert and oriented to person, place, and time.  Psychiatric: She has a normal mood and affect. Her behavior is normal.          Assessment & Plan:

## 2012-10-11 NOTE — Assessment & Plan Note (Addendum)
Keep Lasix at current dose of 80 mg twice a day. Metoprolol 75 mg daily. Statin Aspirin lisinopril

## 2012-10-11 NOTE — Assessment & Plan Note (Signed)
Patient blood pressure is very well-controlled at this time. Given her symptoms of dizziness and her age I think I would be a little more liberal with blood pressure control. Decrease the lisinopril to 20 mg daily. Check kidney function test for potassium and creatinine.

## 2012-10-11 NOTE — Patient Instructions (Signed)
Fall Prevention, Elderly Falls are the leading cause of injuries, accidents, and accidental deaths in people over the age of 65. Falling is a real threat to your ability to live on your own. CAUSES    Poor eyesight or poor hearing can make you more likely to fall.   Illnesses and physical conditions can affect your strength and balance.   Poor lighting, throw rugs and pets in your home can make you more likely to trip or slip.   The side effects of some medicines can upset your balance and lead to falling. These include medicines for depression, sleep problems, high blood pressure, diabetes, and heart conditions.  PREVENTION   Be sure your home is as safe as possible. Here are some tips:  Wear shoes with non-skid soles (not house slippers).   Be sure your home and outside area are well lit.   Use night lights throughout your house, including hallways and stairways.   Remove clutter and clean up spills on floors and walkways.   Remove throw rugs or fasten them to the floor with carpet tape. Tack down carpet edges.   Do not place electrical cords across pathways.   Install grab bars in your bathtub, shower, and toilet area. Towel bars should not be used as a grab bar.   Install handrails on both sides of stairways.   Do not climb on stools or stepladders. Get someone else to help with jobs that require climbing.   Do not wax your floors at all, or use a non-skid wax.   Repair uneven or unsafe sidewalks, walkways or stairs.   Keep frequently used items within reach.   Be aware of pets so you do not trip.  Get regular check-ups from your doctor, and take good care of yourself:  Have your eyes checked every year for vision changes, cataracts, glaucoma, and other eye problems. Wear eyeglasses as directed.   Have your hearing checked every 2 years, or anytime you or others think that you cannot hear well. Use hearing aids as directed.   See your caregiver if you have foot pain  or corns. Sore feet can contribute to falls.   Let your caregiver know if a medicine is making you feel dizzy or making you lose your balance.   Use a cane, walker, or wheelchair as directed. Use walker or wheelchair brakes when getting in and out.   When you get up from bed, sit on the side of the bed for 1 to 2 minutes before you stand up. This will give your blood pressure time to adjust, and you will feel less dizzy.   If you need to go to the bathroom often, consider using a bedside commode.  Keep your body in good shape:  Get regular exercise, especially walking.   Do exercises to strengthen the muscles you use for walking and lifting.   Do not smoke.   Minimize use of alcohol.  SEEK IMMEDIATE MEDICAL CARE IF:    You feel dizzy, weak, or unsteady on your feet.   You feel confused.   You fall.  Document Released: 08/04/2005 Document Revised: 07/24/2011 Document Reviewed: 01/29/2007 [employer] Patient Information 2012 [employer]. 

## 2012-10-11 NOTE — Assessment & Plan Note (Signed)
Patient advised to use walker. She was given printout material on how to avoid falls. Patient was also counseled for about 10 minutes on the importance of avoiding falls at this age and doing everything possible to avoid them.

## 2012-10-12 ENCOUNTER — Other Ambulatory Visit: Payer: PRIVATE HEALTH INSURANCE

## 2012-10-14 ENCOUNTER — Other Ambulatory Visit (HOSPITAL_COMMUNITY): Payer: Self-pay | Admitting: Internal Medicine

## 2012-10-30 ENCOUNTER — Emergency Department (HOSPITAL_COMMUNITY)
Admission: EM | Admit: 2012-10-30 | Discharge: 2012-10-30 | Disposition: A | Discharge status: Home / Self Care | Payer: PRIVATE HEALTH INSURANCE | Attending: Emergency Medicine | Admitting: Emergency Medicine

## 2012-10-30 ENCOUNTER — Emergency Department (HOSPITAL_COMMUNITY): Payer: PRIVATE HEALTH INSURANCE

## 2012-10-30 ENCOUNTER — Encounter (HOSPITAL_COMMUNITY): Payer: Self-pay | Admitting: Emergency Medicine

## 2012-10-30 DIAGNOSIS — E119 Type 2 diabetes mellitus without complications: Secondary | ICD-10-CM | POA: Insufficient documentation

## 2012-10-30 DIAGNOSIS — Z8739 Personal history of other diseases of the musculoskeletal system and connective tissue: Secondary | ICD-10-CM | POA: Insufficient documentation

## 2012-10-30 DIAGNOSIS — Z8669 Personal history of other diseases of the nervous system and sense organs: Secondary | ICD-10-CM | POA: Insufficient documentation

## 2012-10-30 DIAGNOSIS — Z8639 Personal history of other endocrine, nutritional and metabolic disease: Secondary | ICD-10-CM | POA: Insufficient documentation

## 2012-10-30 DIAGNOSIS — Z7982 Long term (current) use of aspirin: Secondary | ICD-10-CM | POA: Insufficient documentation

## 2012-10-30 DIAGNOSIS — I5022 Chronic systolic (congestive) heart failure: Secondary | ICD-10-CM | POA: Insufficient documentation

## 2012-10-30 DIAGNOSIS — Z9849 Cataract extraction status, unspecified eye: Secondary | ICD-10-CM | POA: Insufficient documentation

## 2012-10-30 DIAGNOSIS — Z8679 Personal history of other diseases of the circulatory system: Secondary | ICD-10-CM | POA: Insufficient documentation

## 2012-10-30 DIAGNOSIS — Z862 Personal history of diseases of the blood and blood-forming organs and certain disorders involving the immune mechanism: Secondary | ICD-10-CM | POA: Insufficient documentation

## 2012-10-30 DIAGNOSIS — J45909 Unspecified asthma, uncomplicated: Secondary | ICD-10-CM | POA: Insufficient documentation

## 2012-10-30 DIAGNOSIS — R05 Cough: Secondary | ICD-10-CM | POA: Insufficient documentation

## 2012-10-30 DIAGNOSIS — I10 Essential (primary) hypertension: Secondary | ICD-10-CM | POA: Insufficient documentation

## 2012-10-30 DIAGNOSIS — R059 Cough, unspecified: Secondary | ICD-10-CM | POA: Insufficient documentation

## 2012-10-30 DIAGNOSIS — Z79899 Other long term (current) drug therapy: Secondary | ICD-10-CM | POA: Insufficient documentation

## 2012-10-30 DIAGNOSIS — R042 Hemoptysis: Secondary | ICD-10-CM

## 2012-10-30 LAB — BASIC METABOLIC PANEL
Calcium: 9.1 mg/dL (ref 8.4–10.5)
Chloride: 100 mEq/L (ref 96–112)
Creatinine, Ser: 0.62 mg/dL (ref 0.50–1.10)
GFR calc Af Amer: >90 mL/min (ref >90)
GFR calc non Af Amer: 81 mL/min — ABNORMAL LOW (ref >90)

## 2012-10-30 LAB — CBC WITH DIFFERENTIAL/PLATELET
Basophils Absolute: 0.1 10*3/uL (ref 0.0–0.1)
Basophils Relative: 2 % — ABNORMAL HIGH (ref 0–1)
Eosinophils Relative: 13 % — ABNORMAL HIGH (ref 0–5)
HCT: 40.8 % (ref 36.0–46.0)
MCHC: 32.4 g/dL (ref 30.0–36.0)
Monocytes Absolute: 0.3 10*3/uL (ref 0.1–1.0)
Neutro Abs: 1.2 10*3/uL — ABNORMAL LOW (ref 1.7–7.7)
RDW: 14 % (ref 11.5–15.5)

## 2012-10-30 LAB — PROTIME-INR: Prothrombin Time: 13.4 seconds (ref 11.6–15.2)

## 2012-10-30 MED ORDER — SALINE NASAL SPRAY 0.65 % NA SOLN: 2.0000 | NASAL | Status: DC

## 2012-10-30 NOTE — ED Notes (Signed)
Back from xray

## 2012-10-30 NOTE — ED Notes (Addendum)
Pt. Started spitting up blood 2 days ago and also had a nose bleed at the same time.  Denies Fever, cold, chills, N/V or diarrhea. Has hx. Of asthma and keeps a cough. Sputum has a yellow tinge with old blood in it.

## 2012-10-30 NOTE — ED Notes (Signed)
Old and New EKG given to Dr Read Drivers

## 2012-10-30 NOTE — ED Notes (Signed)
Patient transported to X-ray 

## 2012-10-30 NOTE — ED Provider Notes (Signed)
History    CSN: [id number] Arrival date & time 10/30/12  0605 First MD Initiated Contact with Patient 10/30/12 [phone]    Chief Complaint  Patient presents with  . Hemoptysis   HPI  Pt has woken up this last couple of days and has had to spit up blood.  She has had a sporadic bloody nose the last couple of days.  She has noticed some blood streaking in the sputum.  No fever.  She noticed the blood only in the am.  Not during the day.  The amount has been scant.  No chest pain.  No bleeding elsewhere.  She has history of asthma and uses an inhaler but that is not bothering her today.  She has a cup with her where she has some mucoid sputum with a tinge of blood streaking.  No frank gross blood.  No blood clots coughed up. Past Medical History  Diagnosis Date  . Hypertension   . Diabetes mellitus   . Asthma   . Osteoarthritis     left shoulder  . Pseudogout   . Hypokalemia     2/2 HCTZ on chronic K oral supp  . 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 Grave's disease     s/p thyroidectomy  . Glaucoma(365)   . Hypercholesteremia   . Venous insufficiency   . Cardiomyopathy     Dilated CM, EF 50-55% 2003  . Osteopenia     DEXA 2004  . Lipoma     left inner thigh  . Cataract     left eye  . VASCULITIS 07/27/2006  . Systolic CHF, chronic     Past Surgical History  Procedure Laterality Date  . Abdominal hysterectomy    . Salpingoophorectomy    . Thyroidectomy    . Right hip cemented hemiarthroplasty  10/2010    for Right hip fracture, femoral neck - by Eulas Post, MD    History reviewed. No pertinent family history.  History  Substance Use Topics  . Smoking status: Never Smoker   . Smokeless tobacco: Never Used  . Alcohol Use: No    OB History   Grav Para Term Preterm Abortions TAB SAB Ect Mult Living                  Review of Systems  Constitutional: Negative for fever.  Respiratory: Positive for cough. Negative for shortness of breath.    Cardiovascular: Negative for chest pain.  All other systems reviewed and are negative.    Allergies  Review of patient's allergies indicates no known allergies.  Home Medications   Current Outpatient Rx  Name  Route  Sig  Dispense  Refill  . ADVAIR DISKUS 250-50 MCG/DOSE AEPB      INHALE 1 PUFF INTO THE LUNGS TWICE DAILY   1 each   5   . albuterol (PROVENTIL HFA;VENTOLIN HFA) 108 (90 BASE) MCG/ACT inhaler   Inhalation   Inhale 1-2 puffs into the lungs every 6 (six) hours as needed. Shortness of breath         . aspirin 81 MG tablet   Oral   Take 81 mg by mouth daily.           . brimonidine (ALPHAGAN P) 0.1 % SOLN   Left Eye   Place 1 drop into the left eye every 12 (twelve) hours.   15 mL   1   . dorzolamide (TRUSOPT) 2 % ophthalmic solution  Left Eye   Place 1 drop into the left eye 2 (two) times daily.          . furosemide (LASIX) 80 MG tablet   Oral   Take 1 tablet (80 mg total) by mouth 2 (two) times daily.   60 tablet   5   . lisinopril (PRINIVIL,ZESTRIL) 40 MG tablet   Oral   Take 40 mg by mouth daily.         . metFORMIN (GLUCOPHAGE) 1000 MG tablet   Oral   Take 1 tablet (1,000 mg total) by mouth 2 (two) times daily with a meal. RESTART ON 03/13/2012.         . metoprolol succinate (TOPROL XL) 25 MG 24 hr tablet   Oral   Take 3 tablets (75 mg total) by mouth daily. Take with or immediately following a meal.   90 tablet   1   . potassium chloride (MICRO-K) 10 MEQ CR capsule   Oral   Take 2 capsules (20 mEq total) by mouth 2 (two) times daily.   120 capsule   5   . pravastatin (PRAVACHOL) 40 MG tablet   Oral   Take 1 tablet (40 mg total) by mouth every evening.   30 tablet   11   . sodium chloride (OCEAN NASAL SPRAY) 0.65 % nasal spray   Nasal   Place 2 sprays into the nose every 4 (four) hours.   30 mL   12     BP 151/93  Pulse 65  Temp(Src) 98.4 F (36.9 C) (Oral)  Resp 20  SpO2 98%  Physical Exam  Nursing note  and vitals reviewed. Constitutional: No distress.  HENT:  Head: Normocephalic and atraumatic.  Right Ear: External ear normal.  Left Ear: External ear normal.  Mouth/Throat: No oropharyngeal exudate.  Small amount of dried blood right nares  Eyes: Conjunctivae are normal. Right eye exhibits no discharge. Left eye exhibits no discharge. No scleral icterus.  Neck: Neck supple. No tracheal deviation present.  Cardiovascular: Normal rate, regular rhythm and intact distal pulses.   Pulmonary/Chest: Effort normal and breath sounds normal. No stridor. No respiratory distress. She has no wheezes. She has no rales.  Abdominal: Soft. Bowel sounds are normal. She exhibits no distension. There is no tenderness. There is no rebound and no guarding.  Musculoskeletal: She exhibits edema. She exhibits no tenderness.  Neurological: She is alert. She has normal strength. No sensory deficit. Cranial nerve deficit:  no gross defecits noted. She exhibits normal muscle tone. She displays no seizure activity. Coordination normal.  Skin: Skin is warm and dry. No rash noted. She is not diaphoretic.  Psychiatric: She has a normal mood and affect.    ED Course  Procedures (including critical care time) EKG Rate 58 SINUS RHYTHM ~ normal P axis, V-rate 50- 99 LEFT ANTERIOR FASCICULAR BLOCK ~ axis(240,-40), init forces inf ANTERIOR INFARCT, AGE INDETERMINATE ~ Q >74mS in V2 V3  Labs Reviewed  CBC WITH DIFFERENTIAL - Abnormal; Notable for the following:    WBC 3.0 (*)    Neutrophils Relative 39 (*)    Neutro Abs 1.2 (*)    Eosinophils Relative 13 (*)    Basophils Relative 2 (*)    All other components within normal limits  BASIC METABOLIC PANEL - Abnormal; Notable for the following:    Potassium 3.3 (*)    CO2 33 (*)    Glucose, Bld 142 (*)    GFR calc non Af [place]yse Dago  81 (*)    All other components within normal limits  PROTIME-INR   Dg Chest 2 View  10/30/2012  *RADIOLOGY REPORT*  Clinical Data:  Hemoptysis.  CHEST - 2 VIEW  Comparison: Two-view chest 03/05/2012.  Findings: The heart is enlarged.  Previous small pleural effusions have cleared.  Opacities at the left lung base likely represent scarring. No focal airspace disease is evident otherwise. Degenerative changes are noted in the thoracolumbar spine.  IMPRESSION:  1.  Cardiomegaly without failure. 2.  Stable opacities at the left lung base, likely representing scarring.   Original Report Authenticated By: Marin Roberts, M.D.      1. Hemoptysis       MDM  Pt does not have findings on CXR to suggest pulmonary hemorrhage or pneumonia.  She is not short of breath.  I doubt PE, CHF.   Pt has mentioned some blood in her nares.  She is mostly noticing these symptoms in the am suggesting it could be from nosebleed and post nasal drip.  Heme stable.  Will have her use saline nasal spray.  Follow up with PCP next week if the symptoms persist.  Return to the ED for worsening symptoms.        Celene Kras, MD 10/30/12 [phone]

## 2012-10-30 NOTE — ED Notes (Signed)
Patient waiting for her soon to return to the ED.

## 2012-10-30 NOTE — ED Notes (Signed)
Patient discharged with instructions using the teach back method patient and her son are both able to verbalize an understanding.

## 2012-11-02 ENCOUNTER — Ambulatory Visit (INDEPENDENT_AMBULATORY_CARE_PROVIDER_SITE_OTHER): Payer: PRIVATE HEALTH INSURANCE | Admitting: Internal Medicine

## 2012-11-02 ENCOUNTER — Encounter: Payer: Self-pay | Admitting: Internal Medicine

## 2012-11-02 VITALS — BP 144/82 | HR 74 | Temp 98.5°F | Ht 64.0 in | Wt 168.5 lb

## 2012-11-02 DIAGNOSIS — I10 Essential (primary) hypertension: Secondary | ICD-10-CM

## 2012-11-02 DIAGNOSIS — R040 Epistaxis: Secondary | ICD-10-CM

## 2012-11-02 DIAGNOSIS — E119 Type 2 diabetes mellitus without complications: Secondary | ICD-10-CM

## 2012-11-02 MED ORDER — OXYMETAZOLINE HCL 0.05 % NA SOLN: 2.0000 | Freq: Two times a day (BID) | NASAL | Status: DC

## 2012-11-02 NOTE — Progress Notes (Signed)
Subjective:   Patient ID: [name] female   DOB: 1925-12-07 79 y.o.   MRN: [id number]  HPI: [name] is a 79 y.o. African American female with PMH of dilated cardiomyopathy, systolic CHF EF 20-25% Echo 02/2012, DM2, and atrial tachycardia presenting to the clinic today for ED follow up visit for complaints of 2 weeks of epistaxis episodes.  Her son is present in the room as well today.  They reports that for the past two weeks, [name] has been having intermittent episodes of epistaxis and bloody sputum usually in the mornings ever 3-4 days.  The episodes last approximately 20-30 minutes and resolve with impaction of nostril with tissue paper.  The nose bleeds are described with slow dripping of bright red blood and the bloody sputum is not illicited by cough, but usually during nosebleed if she feels like she has to spit she notices it.  Today, she brought in a cup with approximately a little more than a tea-spoon of dark red clotted blood mixed with sputum.  Mo visible epistaxis or hemoptysis at time of visit.  She has tried the ocean nasal spray given to her by the ED on 10/30/12 with no relief.  She denies any chest pain, worsening shortness of breath, prior similar episodes, recent nasal congestion or cold symptoms, fever, chills, abdominal pain, bloody BMs or hematuria at this time.    Past Medical History  Diagnosis Date  . Hypertension   . Diabetes mellitus   . Asthma   . Osteoarthritis     left shoulder  . Pseudogout   . Hypokalemia     2/2 HCTZ on chronic K oral supp  . 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 Grave's disease     s/p thyroidectomy  . Glaucoma(365)   . Hypercholesteremia   . Venous insufficiency   . Cardiomyopathy     Dilated CM, EF 50-55% 2003  . Osteopenia     DEXA 2004  . Lipoma     left inner thigh  . Cataract     left eye  . VASCULITIS 07/27/2006  . Systolic CHF, chronic    Current Outpatient Prescriptions  Medication  Sig Dispense Refill  . ADVAIR DISKUS 250-50 MCG/DOSE AEPB INHALE 1 PUFF INTO THE LUNGS TWICE DAILY  1 each  5  . albuterol (PROVENTIL HFA;VENTOLIN HFA) 108 (90 BASE) MCG/ACT inhaler Inhale 1-2 puffs into the lungs every 6 (six) hours as needed. Shortness of breath      . aspirin 81 MG tablet Take 81 mg by mouth daily.        . brimonidine (ALPHAGAN P) 0.1 % SOLN Place 1 drop into the left eye every 12 (twelve) hours.  15 mL  1  . dorzolamide (TRUSOPT) 2 % ophthalmic solution Place 1 drop into the left eye 2 (two) times daily.       . furosemide (LASIX) 80 MG tablet Take 1 tablet (80 mg total) by mouth 2 (two) times daily.  60 tablet  5  . lisinopril (PRINIVIL,ZESTRIL) 40 MG tablet Take 40 mg by mouth daily.      . metFORMIN (GLUCOPHAGE) 1000 MG tablet Take 1 tablet (1,000 mg total) by mouth 2 (two) times daily with a meal. RESTART ON 03/13/2012.      . metoprolol succinate (TOPROL XL) 25 MG 24 hr tablet Take 3 tablets (75 mg total) by mouth daily. Take with or immediately following a meal.  90 tablet  1  .  potassium chloride (MICRO-K) 10 MEQ CR capsule Take 2 capsules (20 mEq total) by mouth 2 (two) times daily.  120 capsule  5  . pravastatin (PRAVACHOL) 40 MG tablet Take 1 tablet (40 mg total) by mouth every evening.  30 tablet  11  . sodium chloride (OCEAN NASAL SPRAY) 0.65 % nasal spray Place 2 sprays into the nose every 4 (four) hours.  30 mL  12   No current facility-administered medications for this visit.   No family history on file. History   Social History  . Marital Status: Widowed    Spouse Name: N/A    Number of Children: N/A  . Years of Education: N/A   Occupational History  . Retired    Social History Main Topics  . Smoking status: Never Smoker   . Smokeless tobacco: Never Used  . Alcohol Use: No  . Drug Use: No  . Sexually Active: No   Other Topics Concern  . None   Social History Narrative   Lives in house, with family just next door.    Never smoker, no  alcohol.               Review of Systems: Constitutional: Denies fever, chills, diaphoresis, appetite change and fatigue.  HEENT: epistaxis and bloody sputum.  Denies photophobia, eye pain, redness, hearing loss, ear pain, congestion, sore throat, rhinorrhea, sneezing, mouth sores, trouble swallowing, neck pain, neck stiffness and tinnitus.   Respiratory: Denies SOB, DOE, cough, chest tightness,  and wheezing.   Cardiovascular: Denies chest pain, palpitations and leg swelling.  Gastrointestinal: Denies nausea, vomiting, abdominal pain, diarrhea, constipation, blood in stool and abdominal distention.  Genitourinary: Denies dysuria, urgency, frequency, hematuria, flank pain and difficulty urinating.  Musculoskeletal: Denies myalgias, back pain, joint swelling, arthralgias and gait problem. lower extremity swelling.   Skin: Denies pallor, rash and wound.  Neurological: Denies dizziness, seizures, syncope, weakness, light-headedness, numbness and headaches.  Hematological: Denies adenopathy. Easy bruising, personal or family bleeding history  Psychiatric/Behavioral: Denies suicidal ideation, mood changes, confusion, nervousness, sleep disturbance and agitation  Objective:  Physical Exam: Filed Vitals:   11/02/12 1401  BP: 144/82  Pulse: 74  Temp: 98.5 F (36.9 C)  TempSrc: Oral  Height: 5\' 4"  (1.626 m)  Weight: 168 lb 8 oz (76.431 kg)  SpO2: 97%   Constitutional: Vital signs reviewed.  Patient is a well-developed and well-nourished female in no acute distress and cooperative with exam. Alert and oriented x3.  Head: Normocephalic and atraumatic Ear: TM normal bilaterally Mouth: no erythema or exudates, MMM.  No visible blood and no blood on cotton swan of mouth Eyes: PERRL, EOMI, conjunctivae normal, No scleral icterus.  Nose: crusted dried up bright red blood on base of medial side of right nostril.  No blood noted on cotton swab of both nostrils Neck: Supple, Trachea midline normal  ROM Cardiovascular:RRR, S1 normal, S2 normal, no MRG, pulses symmetric and intact bilaterally Pulmonary/Chest: CTAB, no wheezes, rales, or rhonchi Abdominal: Soft. Non-tender, non-distended, bowel sounds are normal, no masses, organomegaly, or guarding present.  GU: no CVA tenderness Musculoskeletal: No joint deformities, erythema, or stiffness, ROM full and nontender.  +1 pitting edema b/l lower extremities.  Faint distal pulses b/l feet and darkened skin.   Hematology: no cervical adenopathy Neurological: A&O x3, Strength is normal and symmetric bilaterally, cranial nerve II-XII are grossly intact, no focal motor deficit, sensory intact to light touch bilaterally.  Skin: Warm, dry and intact. No rash, cyanosis, or clubbing.  Psychiatric:  Normal mood and affect. speech and behavior is normal. Judgment and thought content normal. Cognition and memory are normal.   Assessment & Plan:  Discussed with Dr. Julieanne Manson asa 2 weeks, try afrin spray, avoid irritation to nostril area and allow healing.

## 2012-11-02 NOTE — Assessment & Plan Note (Signed)
Acute onset, ~2 weeks ago, mainly in the morning, bright red dripping blood from nose and dark red blood with spit but not associated with cough.  The blood in saliva is noticed only at time of nosebleed, not on separate occasions.  On aspirin.  No new medications.  Does admit to forcefully and frequently cleaning her nose, especially with wash cloth that possibly could have damaged vessels in nose leading to bleed.  Likely anterior in origin with involvement of kiesselbach's plexus as that is usually the most common. No chest pain, shortness of breath, prior similar episodes, acid reflux, abdominal pain, or sinus pain or congestion.    -hold aspirin for 2 weeks and monitor, will likely need to resume once bleeding resolved -recommended to avoid touching inner nostrils and allow healing -afrin nasal spray </3 days -continue to monitor -instructed to call clinic if worsening or no improvement -if sudden shortness of breath or chest pain to go to ED

## 2012-11-02 NOTE — Assessment & Plan Note (Signed)
BP Readings from Last 3 Encounters:  11/02/12 144/82  10/30/12 148/72  10/11/12 136/82   Lab Results  Component Value Date   NA 140 10/30/2012   K 3.3* 10/30/2012   CREATININE 0.62 10/30/2012   Assessment:  Blood pressure control: controlled  Progress toward BP goal:  at goal  Plan:  Medications:  continue current medications lasix 80mg  bid, lisinopril 40mg  qd, and toprol xl 75mg  qd  Educational resources provided: brochure  Self management tools provided:

## 2012-11-02 NOTE — Patient Instructions (Signed)
Please stop taking aspirin for at least 2 weeks, then call if you have no more bleeding and we may resume.  [phone]  Please try nasal afrin spray--can be found over the counter: Intranasal: Instill 2-3 sprays into each nostril twice daily for less than 3 days  If no improvement in your bleeding or worsening call the clinic and let us know [phone] or if severe, go to ED.    If you experience any chest pain or shortness of breath go to emergency room right away  Continue to use nasal packing of your nostril when it bleeds to stop  Please try not to irritate the nostrils as much as possible, use water to flush your nose if neccessary time to heal  Thank you for coming in today  Nosebleed A nosebleed can be caused by many things, including:  Getting hit hard in the nose.  Infections.  Dry nose.  Colds.  Medicines. Your doctor may do lab testing if you get nosebleeds a lot and the cause is not known. HOME CARE   If your nose was packed with material, keep it there until your doctor takes it out. Put the pack back in your nose if the pack falls out.  Do not blow your nose for 12 hours after the nosebleed.  Sit up and bend forward if your nose starts bleeding again. Pinch the front half of your nose nonstop for 20 minutes.  Put petroleum jelly inside your nose every morning if you have a dry nose.  Use a humidifier to make the air less dry.  Do not take aspirin.  Try not to strain, lift, or bend at the waist for many days after the nosebleed. GET HELP RIGHT AWAY IF:   Nosebleeds keep happening and are hard to stop or control.  You have bleeding or bruises that are not normal on other parts of the body.  You have a fever.  The nosebleeds get worse.  You get lightheaded, feel faint, sweaty, or throw up (vomit) blood. MAKE SURE YOU:   Understand these instructions.  Will watch your condition.  Will get help right away if you are not doing well or get  worse. Document Released: 05/13/2008 Document Revised: 10/27/2011 Document Reviewed: 05/13/2008 [facility] Patient Information 2013 [place], [place].

## 2012-11-02 NOTE — Assessment & Plan Note (Addendum)
Lab Results  Component Value Date   HGBA1C 7.6 10/11/2012   HGBA1C 8.1 04/26/2012   HGBA1C 7.6 01/21/2012    Assessment:  Diabetes control: fair control  Progress toward A1C goal:  unchanged  Plan:  Medications:  continue current medications  Home glucose monitoring:   Frequency:     Timing:    Instruction/counseling given: reminded to get eye exam, reminded to bring blood glucose meter & log to each visit, reminded to bring medications to each visit and discussed foot care  Educational resources provided: brochure  Self management tools provided:   Recheck a1c may 2014

## 2012-11-29 ENCOUNTER — Telehealth: Payer: Self-pay | Admitting: *Deleted

## 2012-11-29 ENCOUNTER — Ambulatory Visit (INDEPENDENT_AMBULATORY_CARE_PROVIDER_SITE_OTHER): Payer: PRIVATE HEALTH INSURANCE | Admitting: Internal Medicine

## 2012-11-29 ENCOUNTER — Encounter: Payer: Self-pay | Admitting: Internal Medicine

## 2012-11-29 VITALS — BP 143/83 | HR 94 | Temp 98.6°F | Ht 64.0 in | Wt 166.0 lb

## 2012-11-29 DIAGNOSIS — R040 Epistaxis: Secondary | ICD-10-CM

## 2012-11-29 DIAGNOSIS — E119 Type 2 diabetes mellitus without complications: Secondary | ICD-10-CM

## 2012-11-29 NOTE — Patient Instructions (Signed)
General Instructions:  Please follow-up at the clinic in 2 weeks, at which time we will reevaluate your nosebleed - OR, please follow-up in the clinic sooner if needed.  There have been changes in your medications:  STOP Astelin, Afrin nasal sprays  CONTINUE nasal saline spray as needed  Use your humidifier especially at nighttime   Go to the ER if you are having worsening bleeding, starting to feel lightheaded or dizzy or short of breath, etc.  If you have been started on new medication(s), and you develop symptoms concerning for allergic reaction, including, but not limited to, throat closing, tongue swelling, rash, please stop the medication immediately and call the clinic at [phone], and go to the ER.  If you are diabetic, please bring your meter to your next visit.  If symptoms worsen, or new symptoms arise, please call the clinic or go to the ER.  PLEASE BRING ALL OF YOUR MEDICATIONS  IN A BAG TO YOUR NEXT APPOINTMENT   Treatment Goals:  Goals (1 Years of Data) as of 11/29/12         As of Today 11/02/12 10/30/12 10/30/12 10/30/12     Blood Pressure    . Blood Pressure < 140/90  143/83 144/82 148/72 151/93 151/63     Result Component    . HEMOGLOBIN A1C < 8.0          . LDL CALC < 100            Progress Toward Treatment Goals:  Treatment Goal 11/02/2012  Hemoglobin A1C unchanged  Blood pressure at goal    Self Care Goals & Plans:  Self Care Goal 10/11/2012  Manage my medications take my medicines as prescribed; bring my medications to every visit; refill my medications on time  Eat healthy foods drink diet soda or water instead of juice or soda; eat more vegetables; eat foods that are low in salt; eat baked foods instead of fried foods       Care Management & Community Referrals:  Referral 11/02/2012  Referrals made for care management support none needed

## 2012-11-29 NOTE — Assessment & Plan Note (Signed)
Assessment: Patient presents for followup of her epistaxis. Still having daily episodes mostly in the AM and PM, particularly if she blows her nose. She was seen in clinic on 3/18 for this issue and at that time thought to be contributed by manual irritation of the nares from using a washcloth to clean her nose. She was prescribed afrin spray, which she has continued to use and I think now is actually drying her nose out too much and with the continued nose blowing, I think is precipitating her continued episodes of epistaxis. On exam, there is no current evidence of bleed and nothing to suggest posterior bleed at this point. She remains without SOB, CP, lightheadedness, dizziness.  Plan:      Stop afrin and astelin.  Continue nasal saline - increase frequency.  Use humidifier throughout the day, especially at night.  Advised not to blow her nose so hard.  Consider ENT referral next visit if persistent.

## 2012-11-29 NOTE — Progress Notes (Signed)
Patient: [name]   MRN: [id number]  DOB: [birth year]  PCP: Lars Mage, MD   Subjective:    HPI: Ms. Gurbani Figge is a 79 y.o. female with a PMHx as outlined below, who presented to clinic today for the following:  1) Epistaxis - patient has had issues with recurrent epistaxis occuring since 10/2012. She was seen in clinic on 03/18 at which time she described dripping blood from her nose and with spitting of blood clots. She admitted at that time to aggressively cleaning her nose with a wash cloth - this was thought to be the precipitating factors towards her bleeding issues. She was held of her aspirin for a few days, took it for a   Today, patient indicates that she continues to have daily episodes of epistaxis, usually occuring in early AM and at nighttime. Usually precipitated when she blows her nose or picks at it, however, can occur spontaneously as well.   No fevers, chills, ear pain or discharge, sore throat, facial pain. Last episode was this AM. Symptoms can last for 15-30 minutes at a time, usually does nasal packing with resolution of symptoms. No longer using wash clothes or picking on inside of her nose.    Review of Systems: Per HPI.   Current Outpatient Medications: Medication Sig  . ADVAIR DISKUS 250-50 MCG/DOSE AEPB INHALE 1 PUFF INTO THE LUNGS TWICE DAILY  . albuterol (PROVENTIL HFA;VENTOLIN HFA) 108 (90 BASE) MCG/ACT inhaler Inhale 1-2 puffs into the lungs every 6 (six) hours as needed. Shortness of breath  . aspirin 81 MG tablet Take 81 mg by mouth daily.  . brimonidine (ALPHAGAN P) 0.1 % SOLN Place 1 drop into the left eye every 12 (twelve) hours.  . dorzolamide (TRUSOPT) 2 % ophthalmic solution Place 1 drop into the left eye 2 (two) times daily.   . furosemide (LASIX) 80 MG tablet Take 1 tablet (80 mg total) by mouth 2 (two) times daily.  Marland Kitchen lisinopril (PRINIVIL,ZESTRIL) 40 MG tablet Take 40 mg by mouth daily.  . metFORMIN (GLUCOPHAGE) 1000 MG tablet Take 1  tablet (1,000 mg total) by mouth 2 (two) times daily with a meal. RESTART ON 03/13/2012.  . metoprolol succinate (TOPROL XL) 25 MG 24 hr tablet Take 3 tablets (75 mg total) by mouth daily. Take with or immediately following a meal.  . oxymetazoline (AFRIN) 0.05 % nasal spray Place 2 sprays into the nose 2 (two) times daily. For less than or equal to 3 days  . potassium chloride (MICRO-K) 10 MEQ CR capsule Take 2 capsules (20 mEq total) by mouth 2 (two) times daily.  . pravastatin (PRAVACHOL) 40 MG tablet Take 1 tablet (40 mg total) by mouth every evening.  . sodium chloride (OCEAN NASAL SPRAY) 0.65 % nasal spray Place 2 sprays into the nose every 4 (four) hours.    Allergies: No Known Allergies   Past Medical History  Diagnosis Date  . Hypertension   . Diabetes mellitus   . Asthma   . Osteoarthritis     left shoulder  . Pseudogout   . Hypokalemia     2/2 HCTZ on chronic K oral supp  . 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 Grave's disease     s/p thyroidectomy  . Glaucoma(365)   . Hypercholesteremia   . Venous insufficiency   . Cardiomyopathy     Dilated CM, EF 50-55% 2003  . Osteopenia     DEXA 2004  . Lipoma  left inner thigh  . Cataract     left eye  . VASCULITIS 07/27/2006  . Systolic CHF, chronic      Objective:    Physical Exam: Filed Vitals:   11/29/12 1412  BP: 143/83  Pulse: 94  Temp: 98.6 F (37 C)     General: Vital signs reviewed and noted. Well-developed, well-nourished, in no acute distress; alert, appropriate and cooperative throughout examination.  Head: Normocephalic, atraumatic.  Eyes: conjunctivae/corneas clear. PERRL, EOM's intact.   Nose: Mucous membranes moist, inflammed, erythematous. No active bleeding appreciated. No blood clots.  Throat: Oropharynx nonerythematous, no exudate appreciated.   Neck: No deformities, masses, or tenderness noted.  Lungs:  Normal respiratory effort. Clear to auscultation BL without  crackles or wheezes.  Heart: RRR. S1 and S2 normal without gallop, rubs. (+) systolic murmur.  Abdomen:  BS normoactive. Soft, Nondistended, non-tender.  No masses or organomegaly.  Extremities: Trace pretibial edema.    Assessment/ Plan:   The patient's case and plan of care was discussed with attending physician, Dr. Debe Coder.

## 2012-11-29 NOTE — Telephone Encounter (Signed)
Pt called to report that she had a 15 minute nose bleed, she used  About 3 tissues.  This is not new for pt, onset 4 weeks ago. This happens about every 3 days. Pt seen in clinic on 3/18 for same complaint and was asked to hold ASA.  This was done and restarted after 2 weeks..  Will see today at 2:00

## 2012-12-13 ENCOUNTER — Ambulatory Visit (INDEPENDENT_AMBULATORY_CARE_PROVIDER_SITE_OTHER): Payer: PRIVATE HEALTH INSURANCE | Admitting: Internal Medicine

## 2012-12-13 VITALS — BP 165/89 | HR 89 | Temp 98.6°F | Ht 62.5 in | Wt 165.0 lb

## 2012-12-13 DIAGNOSIS — E119 Type 2 diabetes mellitus without complications: Secondary | ICD-10-CM

## 2012-12-13 DIAGNOSIS — R040 Epistaxis: Secondary | ICD-10-CM

## 2012-12-13 NOTE — Patient Instructions (Addendum)
Please do not pick your nose. Please take all your medications as prescribed. Eye exam due in June.

## 2012-12-20 NOTE — Progress Notes (Signed)
INTERNAL MEDICINE TEACHING ATTENDING ADDENDUM - Inez Catalina, MD: I reviewed with the resident Dr. Saralyn Pilar, Ms. Padmore's  medical history, physical examination, diagnosis and results of tests and treatment and I agree with the patient's care as documented.

## 2012-12-21 NOTE — Progress Notes (Signed)
  Subjective:    Patient ID: [name], female    DOB: June 20, 1926, 79 y.o.   MRN: [id number]  Epistaxis     Patient presented today for followup of her nosebleeds.  She denies any further nosebleeds after being asked to be careful about not to pick up on her nose.   No other complaints today.  Review of Systems  Constitutional: Negative for fever, activity change and appetite change.  HENT: Positive for nosebleeds. Negative for sore throat.   Respiratory: Negative for cough and shortness of breath.   Cardiovascular: Negative for chest pain and leg swelling.  Gastrointestinal: Negative for nausea, abdominal pain, diarrhea, constipation and abdominal distention.  Genitourinary: Negative for frequency, hematuria and difficulty urinating.  Neurological: Negative for dizziness and headaches.  Psychiatric/Behavioral: Negative for suicidal ideas and behavioral problems.       Objective:   Physical Exam  Constitutional: She is oriented to person, place, and time. She appears well-developed and well-nourished.  HENT:  Head: Normocephalic and atraumatic.  No bleeding/clots were seen on nasal  Eyes: Conjunctivae and EOM are normal. Pupils are equal, round, and reactive to light. No scleral icterus.  Neck: Normal range of motion. Neck supple. No JVD present. No thyromegaly present.  Cardiovascular: Normal rate, regular rhythm, normal heart sounds and intact distal pulses.  Exam reveals no gallop and no friction rub.   No murmur heard. Pulmonary/Chest: Effort normal and breath sounds normal. No respiratory distress. She has no wheezes. She has no rales.  Abdominal: Soft. Bowel sounds are normal. She exhibits no distension and no mass. There is no tenderness. There is no rebound and no guarding.  Musculoskeletal: Normal range of motion. She exhibits edema (Chronic 1+ pitting edema bilaterally). She exhibits no tenderness.  Lymphadenopathy:    She has no cervical adenopathy.  Neurological:  She is alert and oriented to person, place, and time.  Psychiatric: She has a normal mood and affect. Her behavior is normal.          Assessment & Plan:

## 2012-12-21 NOTE — Assessment & Plan Note (Signed)
Patient is due for an eye examination in June. HbA1c is at goal.

## 2012-12-21 NOTE — Assessment & Plan Note (Signed)
No need for further followup regarding this. Patient was told to continue aspirin.

## 2013-01-03 ENCOUNTER — Encounter: Payer: PRIVATE HEALTH INSURANCE | Admitting: Internal Medicine

## 2013-01-16 ENCOUNTER — Encounter (HOSPITAL_COMMUNITY): Payer: Self-pay | Admitting: Emergency Medicine

## 2013-01-16 ENCOUNTER — Emergency Department (HOSPITAL_COMMUNITY)
Admission: EM | Admit: 2013-01-16 | Discharge: 2013-01-16 | Disposition: A | Discharge status: Home / Self Care | Payer: PRIVATE HEALTH INSURANCE | Attending: Emergency Medicine | Admitting: Emergency Medicine

## 2013-01-16 ENCOUNTER — Emergency Department (HOSPITAL_COMMUNITY): Payer: PRIVATE HEALTH INSURANCE

## 2013-01-16 DIAGNOSIS — M1120 Other chondrocalcinosis, unspecified site: Secondary | ICD-10-CM

## 2013-01-16 DIAGNOSIS — I5022 Chronic systolic (congestive) heart failure: Secondary | ICD-10-CM | POA: Insufficient documentation

## 2013-01-16 DIAGNOSIS — Z8679 Personal history of other diseases of the circulatory system: Secondary | ICD-10-CM | POA: Insufficient documentation

## 2013-01-16 DIAGNOSIS — E119 Type 2 diabetes mellitus without complications: Secondary | ICD-10-CM | POA: Insufficient documentation

## 2013-01-16 DIAGNOSIS — Z8669 Personal history of other diseases of the nervous system and sense organs: Secondary | ICD-10-CM | POA: Insufficient documentation

## 2013-01-16 DIAGNOSIS — I10 Essential (primary) hypertension: Secondary | ICD-10-CM | POA: Insufficient documentation

## 2013-01-16 DIAGNOSIS — Z79899 Other long term (current) drug therapy: Secondary | ICD-10-CM | POA: Insufficient documentation

## 2013-01-16 DIAGNOSIS — M2548 Effusion, other site: Secondary | ICD-10-CM | POA: Insufficient documentation

## 2013-01-16 DIAGNOSIS — Z7982 Long term (current) use of aspirin: Secondary | ICD-10-CM | POA: Insufficient documentation

## 2013-01-16 DIAGNOSIS — J45909 Unspecified asthma, uncomplicated: Secondary | ICD-10-CM | POA: Insufficient documentation

## 2013-01-16 DIAGNOSIS — Z872 Personal history of diseases of the skin and subcutaneous tissue: Secondary | ICD-10-CM | POA: Insufficient documentation

## 2013-01-16 DIAGNOSIS — Z862 Personal history of diseases of the blood and blood-forming organs and certain disorders involving the immune mechanism: Secondary | ICD-10-CM | POA: Insufficient documentation

## 2013-01-16 DIAGNOSIS — IMO0001 Reserved for inherently not codable concepts without codable children: Secondary | ICD-10-CM | POA: Insufficient documentation

## 2013-01-16 DIAGNOSIS — M25532 Pain in left wrist: Secondary | ICD-10-CM

## 2013-01-16 DIAGNOSIS — Z8639 Personal history of other endocrine, nutritional and metabolic disease: Secondary | ICD-10-CM | POA: Insufficient documentation

## 2013-01-16 DIAGNOSIS — Z8739 Personal history of other diseases of the musculoskeletal system and connective tissue: Secondary | ICD-10-CM | POA: Insufficient documentation

## 2013-01-16 DIAGNOSIS — E7800 Pure hypercholesterolemia, unspecified: Secondary | ICD-10-CM | POA: Insufficient documentation

## 2013-01-16 DIAGNOSIS — M25539 Pain in unspecified wrist: Secondary | ICD-10-CM | POA: Insufficient documentation

## 2013-01-16 LAB — POCT I-STAT, CHEM 8
BUN: 8 mg/dL (ref 6–23)
Calcium, Ion: 1.09 mmol/L — ABNORMAL LOW (ref 1.13–1.30)
Chloride: 98 mEq/L (ref 96–112)
Creatinine, Ser: 0.6 mg/dL (ref 0.50–1.10)
Glucose, Bld: 161 mg/dL — ABNORMAL HIGH (ref 70–99)

## 2013-01-16 MED ORDER — HYDROCODONE-ACETAMINOPHEN 5-325 MG PO TABS
1.0000 | ORAL_TABLET | Freq: Once | ORAL | Status: AC
  Administered 2013-01-16: 1 via ORAL
  Filled 2013-01-16: qty 1

## 2013-01-16 MED ORDER — COLCHICINE 0.6 MG PO TABS: 0.6000 mg | ORAL_TABLET | Freq: Two times a day (BID) | ORAL | Status: DC

## 2013-01-16 MED ORDER — HYDROCODONE-ACETAMINOPHEN 5-325 MG PO TABS: ORAL_TABLET | ORAL | Status: DC

## 2013-01-16 NOTE — ED Provider Notes (Signed)
Medical screening examination/treatment/procedure(s) were performed by non-physician practitioner and as supervising physician I was immediately available for consultation/collaboration.  Doug Sou, MD 01/16/13 1654

## 2013-01-16 NOTE — ED Provider Notes (Signed)
Complains of left wrist pain onset last night. Pain is worse with moving her wrist. On exam left upper extremity skin is intact wrist is mildly warm swollen red and tender.radial pulse 2+  Doug Sou, MD 01/16/13 1654

## 2013-01-16 NOTE — ED Notes (Signed)
Patient states that she woke this am with pain in the left hand she denies injury

## 2013-01-16 NOTE — ED Provider Notes (Signed)
History     CSN: [id number]  Arrival date & time 01/16/13  1191   First MD Initiated Contact with Patient 01/16/13 [id number]      Chief Complaint  Patient presents with  . Wrist Pain    (Consider location/radiation/quality/duration/timing/severity/associated sxs/prior treatment) HPI  [name] is a 79 y.o. female with PMH of pseudogout who lives independently and walks with a walker complaining of acute onset of left wrist pain that woke her from sleep at approximately 3 AM. Pain was so severe that she called her daughter crying. She denies injury. Pain is exacerbated by movement and especially supination. She denies trauma, prior similar episodes, numbness or paresthesia.   Past Medical History  Diagnosis Date  . Hypertension   . Diabetes mellitus   . Asthma   . Osteoarthritis     left shoulder  . Pseudogout   . Hypokalemia     2/2 HCTZ on chronic K oral supp  . 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 Grave's disease     s/p thyroidectomy  . Glaucoma   . Hypercholesteremia   . Venous insufficiency   . Cardiomyopathy     Dilated CM, EF 50-55% 2003  . Osteopenia     DEXA 2004  . Lipoma     left inner thigh  . Cataract     left eye  . VASCULITIS 07/27/2006  . Systolic CHF, chronic     Past Surgical History  Procedure Laterality Date  . Abdominal hysterectomy    . Salpingoophorectomy    . Thyroidectomy    . Right hip cemented hemiarthroplasty  10/2010    for Right hip fracture, femoral neck - by Eulas Post, MD    No family history on file.  History  Substance Use Topics  . Smoking status: Never Smoker   . Smokeless tobacco: Never Used  . Alcohol Use: No    OB History   Grav Para Term Preterm Abortions TAB SAB Ect Mult Living                  Review of Systems  Constitutional: Negative for fever.  Respiratory: Negative for shortness of breath.   Cardiovascular: Negative for chest pain.  Gastrointestinal: Negative for  nausea, vomiting, abdominal pain and diarrhea.  Musculoskeletal: Positive for myalgias and joint swelling.  All other systems reviewed and are negative.    Allergies  Review of patient's allergies indicates no known allergies.  Home Medications   Current Outpatient Rx  Name  Route  Sig  Dispense  Refill  . ADVAIR DISKUS 250-50 MCG/DOSE AEPB      INHALE 1 PUFF INTO THE LUNGS TWICE DAILY   1 each   5   . albuterol (PROVENTIL HFA;VENTOLIN HFA) 108 (90 BASE) MCG/ACT inhaler   Inhalation   Inhale 1-2 puffs into the lungs every 6 (six) hours as needed. Shortness of breath         . aspirin 81 MG tablet   Oral   Take 81 mg by mouth daily.         . brimonidine (ALPHAGAN P) 0.1 % SOLN   Left Eye   Place 1 drop into the left eye every 12 (twelve) hours.   15 mL   1   . dorzolamide (TRUSOPT) 2 % ophthalmic solution   Left Eye   Place 1 drop into the left eye 2 (two) times daily.          Marland Kitchen  furosemide (LASIX) 80 MG tablet   Oral   Take 1 tablet (80 mg total) by mouth 2 (two) times daily.   60 tablet   5   . lisinopril (PRINIVIL,ZESTRIL) 40 MG tablet   Oral   Take 40 mg by mouth daily.         . metFORMIN (GLUCOPHAGE) 1000 MG tablet   Oral   Take 1 tablet (1,000 mg total) by mouth 2 (two) times daily with a meal. RESTART ON 03/13/2012.         . metoprolol succinate (TOPROL XL) 25 MG 24 hr tablet   Oral   Take 3 tablets (75 mg total) by mouth daily. Take with or immediately following a meal.   90 tablet   1   . potassium chloride (MICRO-K) 10 MEQ CR capsule   Oral   Take 2 capsules (20 mEq total) by mouth 2 (two) times daily.   120 capsule   5   . pravastatin (PRAVACHOL) 40 MG tablet   Oral   Take 1 tablet (40 mg total) by mouth every evening.   30 tablet   11     BP 137/108  Pulse 73  Temp(Src) 98.6 F (37 C)  Resp 18  SpO2 90%  Physical Exam  Nursing note and vitals reviewed. Constitutional: She is oriented to person, place, and time.  She appears well-developed and well-nourished.  Frail   HENT:  Head: Normocephalic.  edentulous   Eyes: Conjunctivae and EOM are normal.  Cardiovascular: Normal rate.   Pulmonary/Chest: Effort normal. No stridor.  Musculoskeletal: Normal range of motion.       Arms: Mild swelling, trace warmth and tenderness to palpation of the dorsum of left wrist on the radial side, reduced range of motion, neurovascularly intact  Neurological: She is alert and oriented to person, place, and time.  Psychiatric: She has a normal mood and affect.    ED Course  Procedures (including critical care time)  Labs Reviewed  POCT I-STAT, CHEM 8 - Abnormal; Notable for the following:    Glucose, Bld 161 (*)    Calcium, Ion 1.09 (*)    All other components within normal limits   Dg Wrist Complete Left  01/16/2013   *RADIOLOGY REPORT*  Clinical Data: Left wrist pain at ulna.  No injury.  LEFT WRIST - COMPLETE 3+ VIEW  Comparison: None.  Findings: Mild osteopenia.  Widening of the scapholunate interval. 6 mm.  Calcification in the triangular fibrocartilage.  IMPRESSION: Widening of the scapholunate interval, suggesting ligamentous insufficiency.  Calcification in the triangular fibrocartilage, suspicious for calcium pyrophosphate deposition disease.   Original Report Authenticated By: Jeronimo Greaves, M.D.     1. Wrist pain, acute, left       MDM   Filed Vitals:   01/16/13 1045 01/16/13 1100 01/16/13 1130 01/16/13 1138  BP:   167/71   Pulse: 66 59 57   Temp:    98.7 F (37.1 C)  TempSrc:    Oral  Resp:    20  SpO2: 96% 96% 97%      [name] is a 79 y.o. female with past medical history significant for pseudogout (patient and daughter state that nobody has ever told him that she had pseudogout.) complaining of acute onset of left wrist pain at 3 AM this morning.   Pain control with Norco, plain films shows widening of the scapholunate interval suggesting ligamentous insufficiency and there is also  calcification suspicious for pseudogout.  Patient ambulates  with a walker, her daughter lives around the corner from her. Her daughter states that she can stay with her the course of the next several days to help her with her ADLs. We've had extensive discussions on fall precautions.  This is a shared visit with the attending physician who personally evaluated the patient and agrees with the care plan.   Medications  HYDROcodone-acetaminophen (NORCO/VICODIN) 5-325 MG per tablet 1 tablet (1 tablet Oral Given 01/16/13 0934)    The patient is hemodynamically stable, appropriate for, and amenable to, discharge at this time. Pt verbalized understanding and agrees with care plan. Outpatient follow-up and return precautions given.    New Prescriptions   COLCHICINE 0.6 MG TABLET    Take 1 tablet (0.6 mg total) by mouth 2 (two) times daily.   HYDROCODONE-ACETAMINOPHEN (NORCO/VICODIN) 5-325 MG PER TABLET    Take 1-2 tablets by mouth every 6 hours as needed for pain.           Joni Reining Zuzanna Maroney, PA-C 01/16/13 1140

## 2013-01-16 NOTE — ED Notes (Signed)
Daughter stated. She's been up since 300 this morning crying of hand and wrist pain.

## 2013-01-16 NOTE — ED Notes (Signed)
No injury

## 2013-01-16 NOTE — ED Notes (Signed)
Patient transported to X-ray 

## 2013-02-04 ENCOUNTER — Other Ambulatory Visit: Payer: Self-pay | Admitting: Internal Medicine

## 2013-02-08 NOTE — Telephone Encounter (Signed)
Medication refill for lisinopril

## 2013-02-24 ENCOUNTER — Other Ambulatory Visit: Payer: Self-pay

## 2013-03-09 ENCOUNTER — Other Ambulatory Visit: Payer: Self-pay | Admitting: Internal Medicine

## 2013-03-10 NOTE — Telephone Encounter (Signed)
Medication refill

## 2013-03-11 ENCOUNTER — Other Ambulatory Visit: Payer: Self-pay | Admitting: Internal Medicine

## 2013-03-14 ENCOUNTER — Other Ambulatory Visit: Payer: Self-pay | Admitting: Internal Medicine

## 2013-03-14 NOTE — Telephone Encounter (Signed)
Blood pressure medication refill

## 2013-03-14 NOTE — Telephone Encounter (Signed)
Medication refill

## 2013-03-16 ENCOUNTER — Encounter: Payer: Self-pay | Admitting: Internal Medicine

## 2013-03-16 ENCOUNTER — Ambulatory Visit (INDEPENDENT_AMBULATORY_CARE_PROVIDER_SITE_OTHER): Payer: PRIVATE HEALTH INSURANCE | Admitting: Internal Medicine

## 2013-03-16 VITALS — BP 168/89 | HR 75 | Temp 98.6°F | Ht 64.0 in | Wt 182.4 lb

## 2013-03-16 DIAGNOSIS — E876 Hypokalemia: Secondary | ICD-10-CM

## 2013-03-16 DIAGNOSIS — E785 Hyperlipidemia, unspecified: Secondary | ICD-10-CM

## 2013-03-16 DIAGNOSIS — E119 Type 2 diabetes mellitus without complications: Secondary | ICD-10-CM

## 2013-03-16 LAB — LIPID PANEL
HDL: 54 mg/dL (ref >39)
LDL Cholesterol: 63 mg/dL (ref 0–99)
Triglycerides: 74 mg/dL (ref <150)
VLDL: 15 mg/dL (ref 0–40)

## 2013-03-16 MED ORDER — METFORMIN HCL 1000 MG PO TABS: 1000.0000 mg | ORAL_TABLET | Freq: Two times a day (BID) | ORAL | Status: DC

## 2013-03-16 MED ORDER — FUROSEMIDE 80 MG PO TABS: 80.0000 mg | ORAL_TABLET | Freq: Two times a day (BID) | ORAL | Status: DC

## 2013-03-16 MED ORDER — POTASSIUM CHLORIDE ER 10 MEQ PO CPCR: 20.0000 meq | ORAL_CAPSULE | Freq: Two times a day (BID) | ORAL | Status: DC

## 2013-03-16 NOTE — Patient Instructions (Addendum)
Please take your Lasix 80mg  BID as prescribed.  This will help reduce your swelling and help with keeping your weight and blood pressure normal.   Please apply clean, damp warm towel to the scrape on your lower leg.  Do not rub the scab off.  Please call the clinic if you feel feverish or your leg begins to be painful or red or looks worse. Please continue to check your blood sugar before breakfast.  Diabetes Meal Planning Guide The diabetes meal planning guide is a tool to help you plan your meals and snacks. It is important for people with diabetes to manage their blood glucose (sugar) levels. Choosing the right foods and the right amounts throughout your day will help control your blood glucose. Eating right can even help you improve your blood pressure and reach or maintain a healthy weight. CARBOHYDRATE COUNTING MADE EASY When you eat carbohydrates, they turn to sugar. This raises your blood glucose level. Counting carbohydrates can help you control this level so you feel better. When you plan your meals by counting carbohydrates, you can have more flexibility in what you eat and balance your medicine with your food intake. Carbohydrate counting simply means adding up the total amount of carbohydrate grams in your meals and snacks. Try to eat about the same amount at each meal. Foods with carbohydrates are listed below. Each portion below is 1 carbohydrate serving or 15 grams of carbohydrates. Ask your dietician how many grams of carbohydrates you should eat at each meal or snack. Grains and Starches  1 slice bread.   English muffin or hotdog/hamburger bun.   cup cold cereal (unsweetened).   cup cooked pasta or rice.   cup starchy vegetables (corn, potatoes, peas, beans, winter squash).  1 tortilla (6 inches).   bagel.  1 waffle or pancake (size of a CD).   cup cooked cereal.  4 to 6 small crackers. *Whole grain is recommended. Fruit  1 cup fresh unsweetened berries, melon,  papaya, pineapple.  1 small fresh fruit.   banana or mango.   cup fruit juice (4 oz unsweetened).   cup canned fruit in natural juice or water.  2 tbs dried fruit.  12 to 15 grapes or cherries. Milk and Yogurt  1 cup fat-free or 1% milk.  1 cup soy milk.  6 oz light yogurt with sugar-free sweetener.  6 oz low-fat soy yogurt.  6 oz plain yogurt. Vegetables  1 cup raw or  cup cooked is counted as 0 carbohydrates or a "free" food.  If you eat 3 or more servings at 1 meal, count them as 1 carbohydrate serving. Other Carbohydrates   oz chips or pretzels.   cup ice cream or frozen yogurt.   cup sherbet or sorbet.  2 inch square cake, no frosting.  1 tbs honey, sugar, jam, jelly, or syrup.  2 small cookies.  3 squares of graham crackers.  3 cups popcorn.  6 crackers.  1 cup broth-based soup.  Count 1 cup casserole or other mixed foods as 2 carbohydrate servings.  Foods with less than 20 calories in a serving may be counted as 0 carbohydrates or a "free" food. You may want to purchase a book or computer software that lists the carbohydrate gram counts of different foods. In addition, the nutrition facts panel on the labels of the foods you eat are a good source of this information. The label will tell you how big the serving size is and the total number  of carbohydrate grams you will be eating per serving. Divide this number by 15 to obtain the number of carbohydrate servings in a portion. Remember, 1 carbohydrate serving equals 15 grams of carbohydrate. SERVING SIZES Measuring foods and serving sizes helps you make sure you are getting the right amount of food. The list below tells how big or small some common serving sizes are.  1 oz.........4 stacked dice.  3 oz........Marland KitchenDeck of cards.  1 tsp.......Marland KitchenTip of little finger.  1 tbs......Marland KitchenMarland KitchenThumb.  2 tbs.......Marland KitchenGolf ball.   cup......Marland KitchenHalf of a fist.  1 cup.......Marland KitchenA fist. SAMPLE DIABETES MEAL  PLAN Below is a sample meal plan that includes foods from the grain and starches, dairy, vegetable, fruit, and meat groups. A dietician can individualize a meal plan to fit your calorie needs and tell you the number of servings needed from each food group. However, controlling the total amount of carbohydrates in your meal or snack is more important than making sure you include all of the food groups at every meal. You may interchange carbohydrate containing foods (dairy, starches, and fruits). The meal plan below is an example of a 2000 calorie diet using carbohydrate counting. This meal plan has 17 carbohydrate servings. Breakfast  1 cup oatmeal (2 carb servings).   cup light yogurt (1 carb serving).  1 cup blueberries (1 carb serving).   cup almonds. Snack  1 large apple (2 carb servings).  1 low-fat string cheese stick. Lunch  Chicken breast salad.  1 cup spinach.   cup chopped tomatoes.  2 oz chicken breast, sliced.  2 tbs low-fat [place] dressing.  12 whole-wheat crackers (2 carb servings).  12 to 15 grapes (1 carb serving).  1 cup low-fat milk (1 carb serving). Snack  1 cup carrots.   cup hummus (1 carb serving). Dinner  3 oz broiled salmon.  1 cup brown rice (3 carb servings). Snack  1  cups steamed broccoli (1 carb serving) drizzled with 1 tsp olive oil and lemon juice.  1 cup light pudding (2 carb servings). DIABETES MEAL PLANNING WORKSHEET Your dietician can use this worksheet to help you decide how many servings of foods and what types of foods are right for you.  BREAKFAST Food Group and Servings / Carb Servings Grain/Starches __________________________________ Dairy __________________________________________ Vegetable ______________________________________ Fruit ___________________________________________ Meat __________________________________________ Fat ____________________________________________ LUNCH Food Group and Servings / Carb  Servings Grain/Starches ___________________________________ Dairy ___________________________________________ Fruit ____________________________________________ Meat ___________________________________________ Fat _____________________________________________ Laural Golden Food Group and Servings / Carb Servings Grain/Starches ___________________________________ Dairy ___________________________________________ Fruit ____________________________________________ Meat ___________________________________________ Fat _____________________________________________ SNACKS Food Group and Servings / Carb Servings Grain/Starches ___________________________________ Dairy ___________________________________________ Vegetable _______________________________________ Fruit ____________________________________________ Meat ___________________________________________ Fat _____________________________________________ DAILY TOTALS Starches _________________________ Vegetable ________________________ Fruit ____________________________ Dairy ____________________________ Meat ____________________________ Fat ______________________________ Document Released: 05/01/2005 Document Revised: 10/27/2011 Document Reviewed: 03/12/2009 [employer] Patient Information 2014 [employer]

## 2013-03-16 NOTE — Progress Notes (Signed)
Patient ID: Alleyah Twombly, female   DOB: [birth year], 79 y.o.   MRN: [id number]   Subjective:   Patient ID: Emoree Sasaki female   DOB: Jun 26, 1926 79 y.o.   MRN: [id number]  HPI: Ms.Livana Borelli is a 79 y.o. female with a PMH of HTN, HLD, DM type 2 presents for routine health maintenance exam.  She reports falling on her right lower extremity (shin) about 2 weeks ago while walking up the steps to her home.  She denies lightheadedness, CP or palpitations prior but says her "leg gave out."  She has been cleaning the area with peroxide and both her and her son say the area looks improved.    Regarding her diabetes, she says she has been indulging at family cook-outs recently but plans to get back to her healthy eating routine.  She cooks her own meals and says she avoids fast foods.  She reports compliance with her diabetes medications.  Her HgbA1c today is 7.8, up from 7.6 five months ago but still below her goal of 8.  She says she recently went to the eye doctor, Dr. Roda Shutters and was told to continue her eye drops.  She denies numbness, tingling or loss of sensation of the extremities.  Her blood pressure has been elevated at past office visits despite two antihypertensives and lasix use.  Her son says when they check her blood pressure at the store it is usually in the 130s.    Past Medical History  Diagnosis Date  . Hypertension   . Diabetes mellitus   . Asthma   . Osteoarthritis     left shoulder  . Pseudogout   . Hypokalemia     2/2 HCTZ on chronic K oral supp  . 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 Grave's disease     s/p thyroidectomy  . Glaucoma   . Hypercholesteremia   . Venous insufficiency   . Cardiomyopathy     Dilated CM, EF 50-55% 2003  . Osteopenia     DEXA 2004  . Lipoma     left inner thigh  . Cataract     left eye  . VASCULITIS 07/27/2006  . Systolic CHF, chronic    Current Outpatient Prescriptions  Medication Sig Dispense Refill  .  albuterol (PROVENTIL HFA;VENTOLIN HFA) 108 (90 BASE) MCG/ACT inhaler Inhale 1-2 puffs into the lungs every 6 (six) hours as needed. Shortness of breath      . aspirin 81 MG tablet Take 81 mg by mouth daily.      . brimonidine (ALPHAGAN P) 0.1 % SOLN Place 1 drop into the left eye every 12 (twelve) hours.  15 mL  1  . colchicine 0.6 MG tablet Take 1 tablet (0.6 mg total) by mouth 2 (two) times daily.  10 tablet  0  . dorzolamide (TRUSOPT) 2 % ophthalmic solution Place 1 drop into the left eye 2 (two) times daily.       . Fluticasone-Salmeterol (ADVAIR) 250-50 MCG/DOSE AEPB Inhale 1 puff into the lungs every 12 (twelve) hours.      . furosemide (LASIX) 80 MG tablet Take 1 tablet (80 mg total) by mouth 2 (two) times daily.  60 tablet  5  . HYDROcodone-acetaminophen (NORCO/VICODIN) 5-325 MG per tablet Take 1-2 tablets by mouth every 6 hours as needed for pain.  10 tablet  0  . lisinopril (PRINIVIL,ZESTRIL) 20 MG tablet TAKE 1 TABLET BY MOUTH EVERY DAY  30 tablet  0  .  lisinopril (PRINIVIL,ZESTRIL) 20 MG tablet TAKE 1 TABLET BY MOUTH EVERY DAY  30 tablet  0  . lisinopril (PRINIVIL,ZESTRIL) 40 MG tablet Take 40 mg by mouth daily.      . metFORMIN (GLUCOPHAGE) 1000 MG tablet Take 1 tablet (1,000 mg total) by mouth 2 (two) times daily with a meal. RESTART ON 03/13/2012.      . metoprolol succinate (TOPROL XL) 25 MG 24 hr tablet Take 3 tablets (75 mg total) by mouth daily. Take with or immediately following a meal.  90 tablet  1  . metoprolol succinate (TOPROL-XL) 100 MG 24 hr tablet TAKE 1 TABLET BY MOUTH DAILY WITH OR IMMEDIATELY FOLLOWING A MEAL  30 tablet  0  . potassium chloride (MICRO-K) 10 MEQ CR capsule Take 2 capsules (20 mEq total) by mouth 2 (two) times daily.  120 capsule  5  . pravastatin (PRAVACHOL) 40 MG tablet Take 1 tablet (40 mg total) by mouth every evening.  30 tablet  11   No current facility-administered medications for this visit.   No family history on file. History   Social  History  . Marital Status: Widowed    Spouse Name: N/A    Number of Children: N/A  . Years of Education: N/A   Occupational History  . Retired    Social History Main Topics  . Smoking status: Never Smoker   . Smokeless tobacco: Never Used  . Alcohol Use: No  . Drug Use: No  . Sexually Active: No   Other Topics Concern  . None   Social History Narrative   Lives in house, with family just next door.    Never smoker, no alcohol.               Review of Systems: She denies fever, fatigue, chills, headache, change in vision, CP/SOB, constipation/diarrhea, dysuria Objective:  Physical Exam: Filed Vitals:   03/16/13 1344  BP: 168/89  Pulse: 75  Temp: 98.6 F (37 C)  TempSrc: Oral  Height: 5\' 4"  (1.626 m)  Weight: 82.736 kg (182 lb 6.4 oz)  SpO2: 98%   General: Well nourished and in NAD Cardiac: RRR, no rubs, murmurs or gallops Pulm: clear to auscultation bilaterally, moving normal volumes of air Abd: soft, nontender, nondistended, BS present Ext: warm and well perfused; healing scar along the shin without erythema and slightly TTP; +3 lower extremity edema, +2 DPs Neuro: alert and oriented X3  Assessment & Plan:  79 yo female with PMH of HTN, HLD, DM type 2 here for health maintenance exam.    1. Diabetes Type 2 - Stable; her Hgb A1c is at goal (<8.0) with metformin 1000mg  BID.  Her HgbA1c increased slightly from 7.6 possibly related to recent dietary indiscretion.  Discussed the importance of adhering to healthy diet.  Will send for records from Dr. Roda Shutters regarding her recent eye exam.  Will obtain urine microalbumin/Cr to check for nephropathy.  Meformin rx refilled.    2. Lower extremity edema - Patent is currently on 80mg  Lasix BID but reports skipping 3-4 days recently because of urinary frequency.  This increased edema is likely due to these missed doses.  Explained the importance of adhering to Lasix and patient voiced understanding.  Lasix rx refilled.  3.  Hypertension - Elevated in office.  Likely related to patient non-compliance with Lasix.  Patient to continue metoprolol 100mg  and lisinopril 20mg  and begin taking Lasix as prescribed.  Will re-check BP in 1 month.  4. Hyperlipidemia - Last  lipid 1 year ago at goal.  Will check lipid profile.  Patient to continue pravastatin.  5. Healing scrape on Right lower shin - no signs of infection.  Instructed patient to apply clean, warm-water soaked towel to the area as needed and not to pick at scabs that form.  Instructed patient to return to office is feverish or if the wound becomes red, tender or stops improving.

## 2013-03-17 NOTE — Progress Notes (Signed)
Case discussed with Dr. Wilson at the time of the visit.  We reviewed the resident's history and exam and pertinent patient test results.  I agree with the assessment, diagnosis, and plan of care documented in the resident's note. 

## 2013-04-12 ENCOUNTER — Other Ambulatory Visit: Payer: Self-pay | Admitting: Internal Medicine

## 2013-04-12 NOTE — Telephone Encounter (Signed)
Medication refill toprol 

## 2013-04-17 ENCOUNTER — Encounter (HOSPITAL_COMMUNITY): Payer: Self-pay | Admitting: Emergency Medicine

## 2013-04-17 ENCOUNTER — Other Ambulatory Visit: Payer: Self-pay

## 2013-04-17 ENCOUNTER — Emergency Department (HOSPITAL_COMMUNITY): Payer: PRIVATE HEALTH INSURANCE

## 2013-04-17 ENCOUNTER — Emergency Department (HOSPITAL_COMMUNITY)
Admission: EM | Admit: 2013-04-17 | Discharge: 2013-04-17 | Disposition: A | Discharge status: Home / Self Care | Payer: PRIVATE HEALTH INSURANCE | Attending: Emergency Medicine | Admitting: Emergency Medicine

## 2013-04-17 DIAGNOSIS — Z79899 Other long term (current) drug therapy: Secondary | ICD-10-CM | POA: Insufficient documentation

## 2013-04-17 DIAGNOSIS — H409 Unspecified glaucoma: Secondary | ICD-10-CM | POA: Insufficient documentation

## 2013-04-17 DIAGNOSIS — E7800 Pure hypercholesterolemia, unspecified: Secondary | ICD-10-CM | POA: Insufficient documentation

## 2013-04-17 DIAGNOSIS — I340 Nonrheumatic mitral (valve) insufficiency: Secondary | ICD-10-CM

## 2013-04-17 DIAGNOSIS — R0989 Other specified symptoms and signs involving the circulatory and respiratory systems: Secondary | ICD-10-CM | POA: Insufficient documentation

## 2013-04-17 DIAGNOSIS — I080 Rheumatic disorders of both mitral and aortic valves: Secondary | ICD-10-CM | POA: Insufficient documentation

## 2013-04-17 DIAGNOSIS — M1990 Unspecified osteoarthritis, unspecified site: Secondary | ICD-10-CM | POA: Insufficient documentation

## 2013-04-17 DIAGNOSIS — M3130 Wegener's granulomatosis without renal involvement: Secondary | ICD-10-CM | POA: Insufficient documentation

## 2013-04-17 DIAGNOSIS — M899 Disorder of bone, unspecified: Secondary | ICD-10-CM | POA: Insufficient documentation

## 2013-04-17 DIAGNOSIS — R609 Edema, unspecified: Secondary | ICD-10-CM | POA: Insufficient documentation

## 2013-04-17 DIAGNOSIS — R0602 Shortness of breath: Secondary | ICD-10-CM | POA: Insufficient documentation

## 2013-04-17 DIAGNOSIS — I5022 Chronic systolic (congestive) heart failure: Secondary | ICD-10-CM | POA: Insufficient documentation

## 2013-04-17 DIAGNOSIS — I428 Other cardiomyopathies: Secondary | ICD-10-CM | POA: Insufficient documentation

## 2013-04-17 DIAGNOSIS — I429 Cardiomyopathy, unspecified: Secondary | ICD-10-CM

## 2013-04-17 DIAGNOSIS — H269 Unspecified cataract: Secondary | ICD-10-CM | POA: Insufficient documentation

## 2013-04-17 DIAGNOSIS — J45909 Unspecified asthma, uncomplicated: Secondary | ICD-10-CM | POA: Insufficient documentation

## 2013-04-17 DIAGNOSIS — I872 Venous insufficiency (chronic) (peripheral): Secondary | ICD-10-CM | POA: Insufficient documentation

## 2013-04-17 DIAGNOSIS — R0601 Orthopnea: Secondary | ICD-10-CM | POA: Insufficient documentation

## 2013-04-17 DIAGNOSIS — E876 Hypokalemia: Secondary | ICD-10-CM | POA: Insufficient documentation

## 2013-04-17 DIAGNOSIS — E0500 Thyrotoxicosis with diffuse goiter without thyrotoxic crisis or storm: Secondary | ICD-10-CM | POA: Insufficient documentation

## 2013-04-17 DIAGNOSIS — Z7982 Long term (current) use of aspirin: Secondary | ICD-10-CM | POA: Insufficient documentation

## 2013-04-17 DIAGNOSIS — R0609 Other forms of dyspnea: Secondary | ICD-10-CM | POA: Insufficient documentation

## 2013-04-17 LAB — CBC WITH DIFFERENTIAL/PLATELET
Eosinophils Absolute: 0.3 10*3/uL (ref 0.0–0.7)
Eosinophils Relative: 9 % — ABNORMAL HIGH (ref 0–5)
HCT: 37.1 % (ref 36.0–46.0)
Lymphocytes Relative: 41 % (ref 12–46)
Lymphs Abs: 1.3 10*3/uL (ref 0.7–4.0)
MCH: 30.8 pg (ref 26.0–34.0)
MCV: 91.4 fL (ref 78.0–100.0)
Monocytes Absolute: 0.4 10*3/uL (ref 0.1–1.0)
Monocytes Relative: 14 % — ABNORMAL HIGH (ref 3–12)
RBC: 4.06 MIL/uL (ref 3.87–5.11)
WBC: 3.1 10*3/uL — ABNORMAL LOW (ref 4.0–10.5)

## 2013-04-17 LAB — COMPREHENSIVE METABOLIC PANEL
ALT: 6 U/L (ref 0–35)
BUN: 14 mg/dL (ref 6–23)
CO2: 27 mEq/L (ref 19–32)
Calcium: 9.3 mg/dL (ref 8.4–10.5)
Creatinine, Ser: 0.69 mg/dL (ref 0.50–1.10)
GFR calc Af Amer: 89 mL/min — ABNORMAL LOW (ref >90)
GFR calc non Af Amer: 77 mL/min — ABNORMAL LOW (ref >90)
Glucose, Bld: 151 mg/dL — ABNORMAL HIGH (ref 70–99)

## 2013-04-17 LAB — POCT I-STAT TROPONIN I: Troponin i, poc: 0 ng/mL (ref 0.00–0.08)

## 2013-04-17 MED ORDER — ALBUTEROL SULFATE HFA 108 (90 BASE) MCG/ACT IN AERS: 1.0000 | INHALATION_SPRAY | Freq: Four times a day (QID) | RESPIRATORY_TRACT | Status: DC | PRN

## 2013-04-17 MED ORDER — POTASSIUM CHLORIDE CRYS ER 20 MEQ PO TBCR
40.0000 meq | EXTENDED_RELEASE_TABLET | Freq: Once | ORAL | Status: AC
  Administered 2013-04-17: 40 meq via ORAL
  Filled 2013-04-17: qty 2

## 2013-04-17 NOTE — ED Notes (Signed)
Assisted pt to potty chair to urinate.

## 2013-04-17 NOTE — ED Notes (Signed)
Pt.reports progressing shortness of breath for the past several days , denies cough , no fever or chills. No chest pain .

## 2013-04-17 NOTE — ED Provider Notes (Signed)
CSN: [id number]     Arrival date & time 04/17/13  0124 History   First MD Initiated Contact with Patient 04/17/13 [phone]     Chief Complaint  Patient presents with  . Shortness of Breath   (Consider location/radiation/quality/duration/timing/severity/associated sxs/prior Treatment) HPI Patient is a very pleasant 79 year old woman with asthma and severe dilated cardiomyopathy who presents with complaints of shortness of breath. She says she usually uses her [employer] inhaler and she is short of breath. However, she has not been using the inhaler because she only has 60 doses left and she is afraid that she is going to run out. The patient does not have any refills.  She reports compliance with all other medications. She has chronic orthopnea and PND. She has chronic dyspnea on exertion. She says she feels the symptoms have worsened slightly. She has lower leg edema which is chronic. The patient is unsure if the edema is worse. Patient's daughter believes that the edema is slightly worse.  Of note, the patient was seen by her cardiologist is in 48 hours ago.  The patient denies fever and chest pain. She has not been compliant with low-sodium diet    Past Medical History  Diagnosis Date  . Hypertension   . Diabetes mellitus   . Asthma   . Osteoarthritis     left shoulder  . Pseudogout   . Hypokalemia     2/2 HCTZ on chronic K oral supp  . 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 Grave's disease     s/p thyroidectomy  . Glaucoma   . Hypercholesteremia   . Venous insufficiency   . Cardiomyopathy     Dilated CM, EF 50-55% 2003  . Osteopenia     DEXA 2004  . Lipoma     left inner thigh  . Cataract     left eye  . VASCULITIS 07/27/2006  . Systolic CHF, chronic    Past Surgical History  Procedure Laterality Date  . Abdominal hysterectomy    . Salpingoophorectomy    . Thyroidectomy    . Right hip cemented hemiarthroplasty  10/2010    for Right hip fracture,  femoral neck - by Eulas Post, MD   No family history on file. History  Substance Use Topics  . Smoking status: Never Smoker   . Smokeless tobacco: Never Used  . Alcohol Use: No   OB History   Grav Para Term Preterm Abortions TAB SAB Ect Mult Living                 Review of Systems Gen: no weight loss, fevers, chills, night sweats Eyes: no discharge or drainage, no occular pain or visual changes Nose: no epistaxis or rhinorrhea Mouth: no dental pain, no sore throat Neck: no neck pain Lungs: As per history of present illness, otherwise negative CV: As per history of present illness, otherwise negative Abd: no abdominal pain, nausea, vomiting GU: no dysuria or gross hematuria MSK: no myalgias or arthralgias Neuro: no headache, no focal neurologic deficits Skin: no rash Psyche: negative.  Allergies  Review of patient's allergies indicates no known allergies.  Home Medications   Current Outpatient Rx  Name  Route  Sig  Dispense  Refill  . albuterol (PROVENTIL HFA;VENTOLIN HFA) 108 (90 BASE) MCG/ACT inhaler   Inhalation   Inhale 1-2 puffs into the lungs every 6 (six) hours as needed. Shortness of breath         . aspirin  81 MG tablet   Oral   Take 81 mg by mouth daily.         . brimonidine (ALPHAGAN P) 0.1 % SOLN   Left Eye   Place 1 drop into the left eye every 12 (twelve) hours.   15 mL   1   . dorzolamide (TRUSOPT) 2 % ophthalmic solution   Left Eye   Place 1 drop into the left eye 2 (two) times daily.          . Fluticasone-Salmeterol (ADVAIR) 250-50 MCG/DOSE AEPB   Inhalation   Inhale 1 puff into the lungs every 12 (twelve) hours.         . furosemide (LASIX) 80 MG tablet   Oral   Take 1 tablet (80 mg total) by mouth 2 (two) times daily.   60 tablet   2   . lisinopril (PRINIVIL,ZESTRIL) 40 MG tablet   Oral   Take 40 mg by mouth daily.         . metFORMIN (GLUCOPHAGE) 1000 MG tablet   Oral   Take 1 tablet (1,000 mg total) by mouth 2  (two) times daily with a meal.   60 tablet   2   . metoprolol succinate (TOPROL-XL) 25 MG 24 hr tablet   Oral   Take 75 mg by mouth daily. Take with or immediately following a meal.         . potassium chloride (MICRO-K) 10 MEQ CR capsule   Oral   Take 20 mEq by mouth 2 (two) times daily.         . pravastatin (PRAVACHOL) 40 MG tablet   Oral   Take 1 tablet (40 mg total) by mouth every evening.   30 tablet   11    BP 144/129  Pulse 76  Temp(Src) 97.5 F (36.4 C) (Oral)  Resp 18  SpO2 99% Physical Exam Gen: well developed and well nourished appearing, appears chronically ill. Head: NCAT Eyes: PERL, EOMI Nose: no epistaixis or rhinorrhea Mouth/throat: mucosa is moist and pink Neck: supple, no stridor the patient has a very wide and short neck. However, no JVD is appreciated Lungs: Respiratory rate is 24 times per minute, breath sounds are diminished at both bases, I am unable to appreciate rales. No wheezing CV: RRR Abd: soft, morbidly obese, notender, nondistended Back: no ttp, no cva ttp Extremities 1+ symmetric pretibial edema Skin: no rashese, wnl Neuro: CN ii-xii grossly intact, no focal deficits Psyche; normal affect,  calm and cooperative.   ED Course  Procedures (including critical care time) Labs Review Results for orders placed during the hospital encounter of 04/17/13 (from the past 48 hour(s))  PRO B NATRIURETIC PEPTIDE     Status: Abnormal   Collection Time    04/17/13  1:45 AM      Result Value Range   Pro B Natriuretic peptide (BNP) 3490.0 (*) 0 - 450 pg/mL  CBC WITH DIFFERENTIAL     Status: Abnormal   Collection Time    04/17/13  1:45 AM      Result Value Range   WBC 3.1 (*) 4.0 - 10.5 K/uL   RBC 4.06  3.87 - 5.11 MIL/uL   Hemoglobin 12.5  12.0 - 15.0 g/dL   HCT 16.1  09.6 - 04.5 %   MCV 91.4  78.0 - 100.0 fL   MCH 30.8  26.0 - 34.0 pg   MCHC 33.7  30.0 - 36.0 g/dL   RDW 40.9  81.1 -  15.5 %   Platelets 267  150 - 400 K/uL   Neutrophils  Relative % 35 (*) 43 - 77 %   Neutro Abs 1.1 (*) 1.7 - 7.7 K/uL   Lymphocytes Relative 41  12 - 46 %   Lymphs Abs 1.3  0.7 - 4.0 K/uL   Monocytes Relative 14 (*) 3 - 12 %   Monocytes Absolute 0.4  0.1 - 1.0 K/uL   Eosinophils Relative 9 (*) 0 - 5 %   Eosinophils Absolute 0.3  0.0 - 0.7 K/uL   Basophils Relative 2 (*) 0 - 1 %   Basophils Absolute 0.1  0.0 - 0.1 K/uL  COMPREHENSIVE METABOLIC PANEL     Status: Abnormal   Collection Time    04/17/13  1:45 AM      Result Value Range   Sodium 139  135 - 145 mEq/L   Potassium 3.8  3.5 - 5.1 mEq/L   Chloride 104  96 - 112 mEq/L   CO2 27  19 - 32 mEq/L   Glucose, Bld 151 (*) 70 - 99 mg/dL   BUN 14  6 - 23 mg/dL   Creatinine, Ser 9.81  0.50 - 1.10 mg/dL   Calcium 9.3  8.4 - 19.1 mg/dL   Total Protein 6.9  6.0 - 8.3 g/dL   Albumin 3.4 (*) 3.5 - 5.2 g/dL   AST 16  0 - 37 U/L   ALT 6  0 - 35 U/L   Alkaline Phosphatase 94  39 - 117 U/L   Total Bilirubin 0.8  0.3 - 1.2 mg/dL   GFR calc non Af Amer 77 (*) >90 mL/min   GFR calc Af Amer 89 (*) >90 mL/min   Comment: (NOTE)     The eGFR has been calculated using the CKD EPI equation.     This calculation has not been validated in all clinical situations.     eGFR's persistently <90 mL/min signify possible Chronic Kidney     Disease.  POCT I-STAT TROPONIN I     Status: None   Collection Time    04/17/13  1:50 AM      Result Value Range   Troponin i, poc 0.00  0.00 - 0.08 ng/mL   Comment 3            Comment: Due to the release kinetics of cTnI,     a negative result within the first hours     of the onset of symptoms does not rule out     myocardial infarction with certainty.     If myocardial infarction is still suspected,     repeat the test at appropriate intervals.    Imaging Review Dg Chest 2 View  04/17/2013   *RADIOLOGY REPORT*  Clinical Data: Shortness of breath.  CHEST - 2 VIEW  Comparison: 10/30/2012.  Findings: Chronic cardiomegaly, marked.  Mediastinal contours are distorted  by rightward rotation, similar to prior.  There is rightward deviation of the trachea which has been present on multiple prior studies, dating back to at least 01/17/2011.  No significant tracheal narrowing.  Band-like opacities in the retrocardiac lung, scarring versus atelectasis.  No edema, significant effusion, pneumothorax.  There is chronic blunting of the left costophrenic sulcus.  IMPRESSION:  1.  Chronic cardiomegaly without failure. 2.  Left base scarring. 3.  Chronic rightward deviation of the trachea, usually related to enlarged thyroid or ectatic vasculature.   Original Report Authenticated By: Tiburcio Pea    MDM  Patient with severe dilated cardiomyopathy and valvular heart disease. I have suggested admission for judicious diuresis. However, the patient refuses. She says she feels good enough to go home. She says she just wants a refill of ProAir.  I have recommended that she take two 80mg  lasix tabs rather than 1 in the morning and f/u with her PCP on Tuesday. Pt counseled to return for worsening sx. We will supplement hypokalemia.    Brandt Loosen, MD 04/18/13 [phone]

## 2013-05-02 ENCOUNTER — Other Ambulatory Visit: Payer: Self-pay | Admitting: Internal Medicine

## 2013-05-02 ENCOUNTER — Ambulatory Visit (INDEPENDENT_AMBULATORY_CARE_PROVIDER_SITE_OTHER): Payer: PRIVATE HEALTH INSURANCE | Admitting: Internal Medicine

## 2013-05-02 ENCOUNTER — Encounter: Payer: Self-pay | Admitting: Internal Medicine

## 2013-05-02 VITALS — BP 139/77 | HR 69 | Temp 98.8°F | Ht 64.0 in | Wt 184.4 lb

## 2013-05-02 DIAGNOSIS — S81801D Unspecified open wound, right lower leg, subsequent encounter: Secondary | ICD-10-CM

## 2013-05-02 DIAGNOSIS — E119 Type 2 diabetes mellitus without complications: Secondary | ICD-10-CM

## 2013-05-02 DIAGNOSIS — S8990XA Unspecified injury of unspecified lower leg, initial encounter: Secondary | ICD-10-CM

## 2013-05-02 DIAGNOSIS — Z5189 Encounter for other specified aftercare: Secondary | ICD-10-CM

## 2013-05-02 DIAGNOSIS — S81801A Unspecified open wound, right lower leg, initial encounter: Secondary | ICD-10-CM | POA: Insufficient documentation

## 2013-05-02 LAB — GLUCOSE, CAPILLARY: Glucose-Capillary: 186 mg/dL — ABNORMAL HIGH (ref 70–99)

## 2013-05-02 MED ORDER — DOXYCYCLINE HYCLATE 100 MG PO TABS: 100.0000 mg | ORAL_TABLET | Freq: Two times a day (BID) | ORAL | Status: AC

## 2013-05-02 NOTE — Assessment & Plan Note (Signed)
Assessment:  Non-healing wound of right lower leg that began as a scrape ~ 8 weeks ago.  Initially was improving but patient has noticed the wound getting worse in the past three weeks.  Redness, tenderness and exudate concerning for infection.  Patient is afebrile and otherwise feeling well.  Plan:   1) Doxycycline 100mg  BID x 7days  2) Wound care referral  3) Return to clinic in 6 weeks for routine health maintenance

## 2013-05-02 NOTE — Patient Instructions (Addendum)
1. You have been prescribed Doxycycline 100mg  twice daily for seven days.  Please take all of this medication.  You will be contacted with an appointment to see wound care.   2. Please take all medications as prescribed.    3. If you have worsening of your symptoms or new symptoms arise, please call the clinic ([phone]), or go to the ER immediately if symptoms are severe.  Doxycycline tablets or capsules What is this medicine? DOXYCYCLINE (dox i SYE kleen) is a tetracycline antibiotic. It kills certain bacteria or stops their growth. It is used to treat many kinds of infections, like dental, skin, respiratory, and urinary tract infections. It also treats acne, Lyme disease, malaria, and certain sexually transmitted infections. This medicine may be used for other purposes; ask your health care provider or pharmacist if you have questions. What should I tell my health care provider before I take this medicine? They need to know if you have any of these conditions: -liver disease -long exposure to sunlight like working outdoors -stomach problems like colitis -an unusual or allergic reaction to doxycycline, tetracycline antibiotics, other medicines, foods, dyes, or preservatives -pregnant or trying to get pregnant -breast-feeding How should I use this medicine? Take this medicine by mouth with a full glass of water. Follow the directions on the prescription label. It is best to take this medicine without food, but if it upsets your stomach take it with food. Take your medicine at regular intervals. Do not take your medicine more often than directed. Take all of your medicine as directed even if you think you are better. Do not skip doses or stop your medicine early. Talk to your pediatrician regarding the use of this medicine in children. Special care may be needed. While this drug may be prescribed for children as young as 39 years old for selected conditions, precautions do apply. Overdosage: If  you think you have taken too much of this medicine contact a poison control center or emergency room at once. NOTE: This medicine is only for you. Do not share this medicine with others. What if I miss a dose? If you miss a dose, take it as soon as you can. If it is almost time for your next dose, take only that dose. Do not take double or extra doses. What may interact with this medicine? -antacids -barbiturates -birth control pills -bismuth subsalicylate -carbamazepine -methoxyflurane -other antibiotics -phenytoin -vitamins that contain iron -warfarin This list may not describe all possible interactions. Give your health care provider a list of all the medicines, herbs, non-prescription drugs, or dietary supplements you use. Also tell them if you smoke, drink alcohol, or use illegal drugs. Some items may interact with your medicine. What should I watch for while using this medicine? Tell your doctor or health care professional if your symptoms do not improve. Do not treat diarrhea with over the counter products. Contact your doctor if you have diarrhea that lasts more than 2 days or if it is severe and watery. Do not take this medicine just before going to bed. It may not dissolve properly when you lay down and can cause pain in your throat. Drink plenty of fluids while taking this medicine to also help reduce irritation in your throat. This medicine can make you more sensitive to the sun. Keep out of the sun. If you cannot avoid being in the sun, wear protective clothing and use sunscreen. Do not use sun lamps or tanning beds/booths. Birth control pills may not  work properly while you are taking this medicine. Talk to your doctor about using an extra method of birth control. If you are being treated for a sexually transmitted infection, avoid sexual contact until you have finished your treatment. Your sexual partner may also need treatment. Avoid antacids, aluminum, calcium, magnesium, and  iron products for 4 hours before and 2 hours after taking a dose of this medicine. If you are using this medicine to prevent malaria, you should still protect yourself from contact with mosquitos. Stay in screened-in areas, use mosquito nets, keep your body covered, and use an insect repellent. What side effects may I notice from receiving this medicine? Side effects that you should report to your doctor or health care professional as soon as possible: -allergic reactions like skin rash, itching or hives, swelling of the face, lips, or tongue -difficulty breathing -fever -itching in the rectal or genital area -pain on swallowing -redness, blistering, peeling or loosening of the skin, including inside the mouth -severe stomach pain or cramps -unusual bleeding or bruising -unusually weak or tired -yellowing of the eyes or skin Side effects that usually do not require medical attention (report to your doctor or health care professional if they continue or are bothersome): -diarrhea -loss of appetite -nausea, vomiting This list may not describe all possible side effects. Call your doctor for medical advice about side effects. You may report side effects to FDA at 1-800-FDA-1088. Where should I keep my medicine? Keep out of the reach of children. Store at room temperature, below 30 degrees C (86 degrees F). Protect from light. Keep container tightly closed. Throw away any unused medicine after the expiration date. Taking this medicine after the expiration date can make you seriously ill. NOTE: This sheet is a summary. It may not cover all possible information. If you have questions about this medicine, talk to your doctor, pharmacist, or health care provider.  2012, [employer]. (11/23/2007 4:53:02 PM)

## 2013-05-02 NOTE — Progress Notes (Signed)
  Subjective:    Patient ID: [name], female    DOB: 1925/09/16, 79 y.o.   MRN: [id number]  HPI Comments: [name] is an 79 year old woman with a PMH of HTN, dilated cardiomyopathy, DM type 2, HLD and glaucoma.  She is here for a non-healing wound on the right lower extremity.  She was seen clinic for routine check up 6 weeks ago at which time the wound appeared to be a minor scrape without sign of infection.  She says the wound is pruritic and sometime oozes pink exudate.  She denies fever or pain in the area of the wound.  The patient says she has been cleaning the wound with a warm-water soaked towel daily.  She has also been using peroxide.       Review of Systems  Constitutional: Negative for fever and appetite change.  Respiratory: Negative for shortness of breath.   Cardiovascular: Positive for leg swelling.  Gastrointestinal: Negative for diarrhea and constipation.  Skin: Positive for wound.       Objective:   Physical Exam  Constitutional: She appears well-nourished. No distress.  HENT:  Mouth/Throat: Oropharynx is clear and moist. No oropharyngeal exudate.  Eyes: Pupils are equal, round, and reactive to light.  Neck: Neck supple.  Cardiovascular: Normal rate, regular rhythm and normal heart sounds.   Pulmonary/Chest: Effort normal and breath sounds normal. No respiratory distress. She has no wheezes. She has no rales.  Abdominal: Soft. Bowel sounds are normal. She exhibits no distension. There is no tenderness.  Musculoskeletal:  2+ pretibial edema  Skin: Skin is warm. She is not diaphoretic.  Wound on R pretibial skin with displaced scab.  + yellow exudate. Skin surrounding wound is TTP and slightly erythematous.  There is no odor.          Assessment & Plan:  Please see problem based assessment and plan.

## 2013-05-03 NOTE — Progress Notes (Signed)
I saw and evaluated the patient.  I personally confirmed the key portions of the history and exam documented by Dr. Andrey Campanile and I reviewed pertinent patient test results.  The assessment, diagnosis, and plan were formulated together and I agree with the documentation in the resident's note. Wound care referral as pt likely will need debridement 2/2 eschar.

## 2013-05-06 ENCOUNTER — Other Ambulatory Visit: Payer: Self-pay | Admitting: Internal Medicine

## 2013-05-09 NOTE — Telephone Encounter (Signed)
Medication refill

## 2013-05-14 ENCOUNTER — Other Ambulatory Visit: Payer: Self-pay | Admitting: Internal Medicine

## 2013-05-17 ENCOUNTER — Inpatient Hospital Stay (HOSPITAL_COMMUNITY)
Admission: EM | Admit: 2013-05-17 | Discharge: 2013-05-19 | DRG: 292 | Disposition: A | Discharge status: Home / Self Care | Payer: PRIVATE HEALTH INSURANCE | Attending: Internal Medicine | Admitting: Internal Medicine

## 2013-05-17 ENCOUNTER — Emergency Department (HOSPITAL_COMMUNITY): Payer: PRIVATE HEALTH INSURANCE

## 2013-05-17 ENCOUNTER — Encounter (HOSPITAL_COMMUNITY): Payer: Self-pay | Admitting: *Deleted

## 2013-05-17 DIAGNOSIS — R7989 Other specified abnormal findings of blood chemistry: Secondary | ICD-10-CM

## 2013-05-17 DIAGNOSIS — R778 Other specified abnormalities of plasma proteins: Secondary | ICD-10-CM

## 2013-05-17 DIAGNOSIS — I38 Endocarditis, valve unspecified: Secondary | ICD-10-CM | POA: Diagnosis present

## 2013-05-17 DIAGNOSIS — E7800 Pure hypercholesterolemia, unspecified: Secondary | ICD-10-CM | POA: Diagnosis present

## 2013-05-17 DIAGNOSIS — J45909 Unspecified asthma, uncomplicated: Secondary | ICD-10-CM | POA: Diagnosis present

## 2013-05-17 DIAGNOSIS — Z9119 Patient's noncompliance with other medical treatment and regimen: Secondary | ICD-10-CM | POA: Diagnosis present

## 2013-05-17 DIAGNOSIS — I482 Chronic atrial fibrillation, unspecified: Secondary | ICD-10-CM | POA: Diagnosis present

## 2013-05-17 DIAGNOSIS — I2489 Other forms of acute ischemic heart disease: Secondary | ICD-10-CM | POA: Diagnosis present

## 2013-05-17 DIAGNOSIS — E0500 Thyrotoxicosis with diffuse goiter without thyrotoxic crisis or storm: Secondary | ICD-10-CM | POA: Diagnosis present

## 2013-05-17 DIAGNOSIS — I313 Pericardial effusion (noninflammatory): Secondary | ICD-10-CM | POA: Diagnosis not present

## 2013-05-17 DIAGNOSIS — I509 Heart failure, unspecified: Secondary | ICD-10-CM

## 2013-05-17 DIAGNOSIS — Z91199 Patient's noncompliance with other medical treatment and regimen due to unspecified reason: Secondary | ICD-10-CM | POA: Diagnosis present

## 2013-05-17 DIAGNOSIS — I10 Essential (primary) hypertension: Secondary | ICD-10-CM | POA: Diagnosis present

## 2013-05-17 DIAGNOSIS — H409 Unspecified glaucoma: Secondary | ICD-10-CM | POA: Diagnosis present

## 2013-05-17 DIAGNOSIS — I3139 Other pericardial effusion (noninflammatory): Secondary | ICD-10-CM | POA: Diagnosis not present

## 2013-05-17 DIAGNOSIS — I5042 Chronic combined systolic (congestive) and diastolic (congestive) heart failure: Secondary | ICD-10-CM | POA: Diagnosis present

## 2013-05-17 DIAGNOSIS — S81801A Unspecified open wound, right lower leg, initial encounter: Secondary | ICD-10-CM

## 2013-05-17 DIAGNOSIS — I248 Other forms of acute ischemic heart disease: Secondary | ICD-10-CM | POA: Diagnosis present

## 2013-05-17 DIAGNOSIS — I252 Old myocardial infarction: Secondary | ICD-10-CM | POA: Diagnosis present

## 2013-05-17 DIAGNOSIS — S81801S Unspecified open wound, right lower leg, sequela: Secondary | ICD-10-CM

## 2013-05-17 DIAGNOSIS — I4891 Unspecified atrial fibrillation: Secondary | ICD-10-CM | POA: Diagnosis present

## 2013-05-17 DIAGNOSIS — M1990 Unspecified osteoarthritis, unspecified site: Secondary | ICD-10-CM | POA: Diagnosis present

## 2013-05-17 DIAGNOSIS — E876 Hypokalemia: Secondary | ICD-10-CM | POA: Diagnosis present

## 2013-05-17 DIAGNOSIS — E119 Type 2 diabetes mellitus without complications: Secondary | ICD-10-CM

## 2013-05-17 DIAGNOSIS — I4719 Other supraventricular tachycardia: Secondary | ICD-10-CM

## 2013-05-17 DIAGNOSIS — I5043 Acute on chronic combined systolic (congestive) and diastolic (congestive) heart failure: Secondary | ICD-10-CM | POA: Diagnosis present

## 2013-05-17 DIAGNOSIS — I428 Other cardiomyopathies: Secondary | ICD-10-CM | POA: Diagnosis present

## 2013-05-17 DIAGNOSIS — R609 Edema, unspecified: Secondary | ICD-10-CM | POA: Diagnosis present

## 2013-05-17 DIAGNOSIS — D7218 Eosinophilia in diseases classified elsewhere: Secondary | ICD-10-CM | POA: Diagnosis present

## 2013-05-17 DIAGNOSIS — I471 Supraventricular tachycardia: Secondary | ICD-10-CM

## 2013-05-17 HISTORY — DX: Supraventricular tachycardia: I47.1

## 2013-05-17 HISTORY — DX: Pulmonary hypertension, unspecified: I27.20

## 2013-05-17 HISTORY — DX: Pleural effusion, not elsewhere classified: J90

## 2013-05-17 HISTORY — DX: Endocarditis, valve unspecified: I38

## 2013-05-17 HISTORY — DX: Chronic combined systolic (congestive) and diastolic (congestive) heart failure: I50.42

## 2013-05-17 HISTORY — DX: Pericardial effusion (noninflammatory): I31.3

## 2013-05-17 HISTORY — DX: Ventricular tachycardia: I47.2

## 2013-05-17 HISTORY — DX: Other supraventricular tachycardia: I47.19

## 2013-05-17 HISTORY — DX: Other ventricular tachycardia: I47.29

## 2013-05-17 HISTORY — DX: Other pericardial effusion (noninflammatory): I31.39

## 2013-05-17 LAB — CBC
HCT: 37.1 % (ref 36.0–46.0)
MCHC: 34 g/dL (ref 30.0–36.0)
Platelets: 282 10*3/uL (ref 150–400)
RDW: 14 % (ref 11.5–15.5)

## 2013-05-17 LAB — BASIC METABOLIC PANEL
BUN: 23 mg/dL (ref 6–23)
GFR calc Af Amer: 87 mL/min — ABNORMAL LOW (ref >90)
GFR calc non Af Amer: 75 mL/min — ABNORMAL LOW (ref >90)
Potassium: 4 mEq/L (ref 3.5–5.1)

## 2013-05-17 LAB — POCT I-STAT TROPONIN I

## 2013-05-17 MED ORDER — FUROSEMIDE 10 MG/ML IJ SOLN
60.0000 mg | Freq: Once | INTRAMUSCULAR | Status: AC
  Administered 2013-05-17: 60 mg via INTRAVENOUS
  Filled 2013-05-17: qty 6

## 2013-05-17 NOTE — H&P (Signed)
Date: 05/18/2013               Patient Name:  [name] MRN: [id number]  DOB: Jun 19, 1926 Age / Sex: 79 y.o., female   PCP: Evelena Peat, DO         Medical Service: Internal Medicine Teaching Service         Attending Physician: Dr. Orvan Falconer    First Contact: Dr. Claudell Kyle Pager: [phone]  Second Contact: Dr. Bosie Clos Pager: [phone]       After Hours (After 5p/  First Contact Pager: [phone]  weekends / holidays): Second Contact Pager: [phone]   Chief Complaint: shortness of breath  History of Present Illness:  [name] is an 79 year old woman with history of DM2 (A1C 7.8 in 02/2013), HTN, dilated cardiomyopathy, Grave's disease (s/p thyroidectomy), glaucoma who presents to the ED with increased shortness of breath x 1-2 days as well as bilateral lower extremity edema. Patient is a poor historian.  States that SOB worse with exertion (she walks with walker at home) and she has also had orthopnea; unclear if she has had any weight gain.  She takes Lasix 80 mg BID and reports good medication compliance. She has been hospitalized twice in past for heart failure exacerbations. Patient lives alone but has one of her six daughters with her around the clock.  She is not on home O2.   Of note, she was seen in [facility] on 05/02/13 for non-healing right lower extremity wound.  At that appointment, wound was pruritic with yellow discharge and some surround erythema but no pain, no fevers.  Patient was prescribed doxycycline 100 mg PO BID x 7 days which she says she never picked up; she was also referred to wound care for likely debridement but she does not think she ever saw wound care.   Denies chest pain, cough, lightheadedness, fever/chills, abdominal pain, changes in bowel/bladder habits.   In the ED, patient was placed on 2L O2 andreceived Lasix 60 mg IV.  She says her SOB is much improved and her leg swelling is already visibly reduced with these treatments.  She was urinating frequently so nurse  placed Foley catheter which will also be helpful in measuring Is and Os.    Meds: Current Facility-Administered Medications  Medication Dose Route Frequency Provider Last Rate Last Dose  . albuterol (PROVENTIL HFA;VENTOLIN HFA) 108 (90 BASE) MCG/ACT inhaler 1-2 puff  1-2 puff Inhalation Q6H PRN Annett Gula, MD      . aspirin chewable tablet 81 mg  81 mg Oral Daily Cliffton Asters, MD      . dorzolamide (TRUSOPT) 2 % ophthalmic solution 1 drop  1 drop Left Eye BID Annett Gula, MD   1 drop at 05/18/13 0239  . enoxaparin (LOVENOX) injection 40 mg  40 mg Subcutaneous Q24H Annett Gula, MD      . furosemide (LASIX) injection 60 mg  60 mg Intravenous BID Annett Gula, MD      . insulin aspart (novoLOG) injection 0-15 Units  0-15 Units Subcutaneous TID WC Annett Gula, MD      . lisinopril (PRINIVIL,ZESTRIL) tablet 20 mg  20 mg Oral Daily Annett Gula, MD      . metFORMIN (GLUCOPHAGE) tablet 1,000 mg  1,000 mg Oral BID WC Annett Gula, MD      . potassium chloride SA (K-DUR,KLOR-CON) CR tablet 20 mEq  20 mEq Oral BID Annett Gula, MD   20 mEq at  05/18/13 0239  . simvastatin (ZOCOR) tablet 20 mg  20 mg Oral q1800 Annett Gula, MD      . sodium chloride 0.9 % injection 3 mL  3 mL Intravenous Q12H Annett Gula, MD   3 mL at 05/18/13 0244  home meds- ASA, furosemide 80 BID, lisinopril 20 mg daily, metformin 1000 mg BID, K 20 meq daily, pravastatin 40 mg daily  Allergies: Allergies as of 05/17/2013  . (No Known Allergies)   Past Medical History  Diagnosis Date  . Hypertension   . Diabetes mellitus   . Asthma   . Osteoarthritis     left shoulder  . Pseudogout   . Hypokalemia     2/2 HCTZ on chronic K oral supp  . 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 Grave's disease     s/p thyroidectomy  . Glaucoma   . Hypercholesteremia   . Venous insufficiency   . Cardiomyopathy     Dilated CM, EF 50-55% 2003  . Osteopenia     DEXA 2004  . Lipoma     left  inner thigh  . Cataract     left eye  . VASCULITIS 07/27/2006  . Systolic CHF, chronic    Past Surgical History  Procedure Laterality Date  . Abdominal hysterectomy    . Salpingoophorectomy    . Thyroidectomy    . Right hip cemented hemiarthroplasty  10/2010    for Right hip fracture, femoral neck - by Eulas Post, MD   History reviewed. No pertinent family history. History   Social History  . Marital Status: Widowed    Spouse Name: N/A    Number of Children: N/A  . Years of Education: N/A   Occupational History  . Retired    Social History Main Topics  . Smoking status: Never Smoker   . Smokeless tobacco: Never Used  . Alcohol Use: No  . Drug Use: No  . Sexual Activity: No   Other Topics Concern  . Not on file   Social History Narrative   Lives in house, with family just next door.    Never smoker, no alcohol.      Used to work at [employer].    Before that, worked in food prep.                    Review of Systems: Review of Systems  Constitutional: Negative for fever, chills, weight loss, malaise/fatigue and diaphoresis.  Eyes: Negative for blurred vision.  Respiratory: Positive for shortness of breath. Negative for cough and wheezing.   Cardiovascular: Positive for orthopnea and leg swelling. Negative for chest pain and palpitations.  Gastrointestinal: Negative for nausea, vomiting, abdominal pain, diarrhea, constipation and blood in stool.  Genitourinary: Negative for dysuria.  Neurological: Negative for dizziness, focal weakness, loss of consciousness, weakness and headaches.    Physical Exam: Blood pressure 144/91, pulse 103, temperature 97.3 F (36.3 C), temperature source Oral, resp. rate 21, height 5\' 4"  (1.626 m), weight 171 lb 8.3 oz (77.8 kg), SpO2 94.00%.on 2L General: alert, cooperative, and in no apparent distress, pleasantly demented HEENT: NCAT pupils equal round and reactive to light, vision grossly intact,  oropharynx clear and non-erythematous  Neck: supple, no lymphadenopathy Lungs: mild crackles at bilateral lung bases, mild end expiratory wheezing, normal work of respiration, no rhonchi Heart: irregularly irregular rate, 2/6 systolic murmur at LLSB no gallops, rubs Abdomen: soft, non-tender, non-distended, normal bowel sounds  Extremities: 1+ pitting edema to mid-shins of right lower extremity, 2+ of left lower extremity, no pain to palpation of bilateral calves; wound on right pretibial skin with scab, no surrounding erythema, no odor; warm well perfused, no cyanosis, clubbing Neurologic: alert & oriented X3, cranial nerves II-XII intact, strength grossly intact, sensation intact to light touch   Lab results: Basic Metabolic Panel:  Recent Labs  16/10/96 1954  NA 138  K 4.0  CL 103  CO2 21  GLUCOSE 121*  BUN 23  CREATININE 0.76  CALCIUM 8.7   CBC:  Recent Labs  05/17/13 1954  WBC 3.4*  HGB 12.6  HCT 37.1  MCV 89.8  PLT 282   BNP:  Recent Labs  05/17/13 1955  PROBNP 4185.0*    Imaging results:  Dg Chest 2 View  05/17/2013   CLINICAL DATA:  Shortness of breath and chest pain.  EXAM: CHEST  2 VIEW  COMPARISON:  04/17/2013.  FINDINGS: The heart is enlarged but stable. There is tortuosity, ectasia and calcification of the thoracic aorta. Prominent mediastinal and hilar contours are unchanged. There is central vascular congestion but no overt pulmonary edema. No pleural effusions. The bony thorax is intact.  IMPRESSION: Stable cardiac enlargement and moderate central vascular congestion without overt pulmonary edema.   Electronically Signed   By: Loralie Champagne M.D.   On: 05/17/2013 19:49    Other results: EKG: atrial fibrillation with rate of 100, left axis deviation (old), inverted T waves in aVL (old), no LVH, no ST changes  Assessment & Plan by Problem: #Acute on chronic heart failure (combined systolic and diastolic)- Pt presenting with SOB and bilateral lower  extremity edema x 1-2 days, + orthopnea.  She takes Lasix 80 mg BID at home with good medication compliance (this dose initiated in 02/2012 during admission for heart failure exacerbation).  On exam, mild bibasilar crackles appreciated as well as 1+ pitting edema of right lower extremity and 2+ pitting edema of left lower extremity (no erythema or tenderness to palpation of bilateral calves).  CXR showed stable cardiac enlargement with moderate central vascular congestion without overt pulmonary edema;  pBNP 4185, increased from 3490 on 04/17/13.  Of note, pt has had asymmetric leg swelling in 02/2012 with previous heart failure exacerbation, also L>R (3+ pitting edema at that time).  LE dopplers in 02/2012 negative for DVT but showed ruptured Baker's cyst, thought to explain the asymmetrical edema.  Echo in 02/2012 showed EF 20-25% which was reduced from 2003 echo which showed EF of 50-55% and abnormal left ventricular relaxation (previously noted to be 20-25% in 2001 which was not further investigated at that time).  Lexiscan Myoview performed on 03/11/12 showed EF 17% with dilated LV and global hypokinesis.  Pt received Lasix 60 mg IV in ED and has already diuresed 2L since.  She was prescribed B-blocker in past (Toprol-XL 75 mg daily) which is reasonable given her heart failure, but this is no longer on her med list, and pt does not think she has been taking. Troponin x1 negative, troponin x 2 borderline at 0.37; no chest pain.  -admit to telemetry -consult to heart failure team -Lasix 60 mg IV BID (home dose is 80 mg PO BID) -continue home lisinopril, statin, ASA; holding metoprolol during acute CHF exacerbation -daily weights -strict Is and Os (foley catheter in place) -PT/OT consult  #Atrial fibrillation- On admission, pt's pulse was irregular, and she was mildly tachycardic. We will continue to monitor with diuresis and likely  need to restart metoprolol for better rate control though not dangerous at  this time.  CHADS vasc score 6 (moderate to high) so pt is candidate for anticoagulation but need to weigh risks and benefits. S/p thyroidectomy, reportedly on no thyroid medications; TSH 1.63 in 02/2012 but will recheck.  She has had multifocal atrial tachycardia in past.  -repeat EKG pending -continue to monitor, restart metoprolol asap for rate control -consider anticoagulation with risk-benefit analysis -TSH in AM  #Right pretibial wound- stable, no obvious drainage, no erythema. Pt did not take the doxycycline she was prescribed by [facility] earlier this month and did not follow-up with wound care as outpatient.  -consult to wound care  #DM2- A1C 7.8 on 7/30.14, on metformin 1000 mg BID at home. We will hold this medication while she is inpatient and cover her with SSI.  -CBGs before meals and qhs -SSI moderate -heart healthy carb mod diet  #HTN- stable, BP 140s/80s-90s.  Holding metoprolol for now, continue home dose of lisinopril.  # DVT PPX- lovenox   Dispo: Disposition is deferred at this time, awaiting improvement of current medical problems. Anticipated discharge in approximately 2-3 day(s).   The patient does have a current PCP Evelena Peat, DO) and does need an [facility] hospital follow-up appointment after discharge.   Signed: Rocco Serene, MD 05/18/2013, 4:15 AM   Attending Addendum: I have seen and examined [name] Jervis Trapani and discussed her management with my team. I agree that she probably has acute on chronic heart failure. She says she has scales at home and has been told not to gain weight but cannot tell me her recent weight, if it has changed or what she is supposed to do if she does gain weight. She is feeling better today after diuresis. Cardiology will assist [place] with her new onset A fib and heart failure and we will work with [facility] to arrange out patient care management.  Cliffton Asters, MD [facility] [phone] pager   [phone]  cell 05/18/2013, 4:58 PM

## 2013-05-17 NOTE — ED Notes (Signed)
Patient stood on bedside scale with assistance from 2 staff members. Patient able to balance and bear own weight during weight collection.

## 2013-05-17 NOTE — ED Notes (Signed)
Pt. Just placed in Pod A 3,.

## 2013-05-17 NOTE — ED Notes (Signed)
Pt reports onset of sob yesterday and then had some left side chest pains today, denies recent cough. Has swelling to her legs but states that's normal for her. ekg being done at triage. Airway is intact, speaking in full sentences.

## 2013-05-17 NOTE — ED Provider Notes (Signed)
CSN: [id number]     Arrival date & time 05/17/13  1824 History   First MD Initiated Contact with Patient 05/17/13 1847     Chief Complaint  Patient presents with  . Shortness of Breath  . Chest Pain   (Consider location/radiation/quality/duration/timing/severity/associated sxs/prior Treatment) Patient is a 79 y.o. female presenting with shortness of breath and chest pain. The history is provided by the patient.  Shortness of Breath Associated symptoms: chest pain   Chest Pain Associated symptoms: shortness of breath    patient here complaining of shortness of breath which began yesterday. No cough reported. No wheezing noted. She does note orthopnea and dyspnea on exertion. On review of the patient's old medical chart, she does have a prior history of cardiomyopathy. Denies any anginal type chest pain. No fever or chills. Symptoms are better with rest and worse with exertion. No treatment used prior to arrival. Does note some bilateral lower extremity edema.  Past Medical History  Diagnosis Date  . Hypertension   . Diabetes mellitus   . Asthma   . Osteoarthritis     left shoulder  . Pseudogout   . Hypokalemia     2/2 HCTZ on chronic K oral supp  . 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 Grave's disease     s/p thyroidectomy  . Glaucoma   . Hypercholesteremia   . Venous insufficiency   . Cardiomyopathy     Dilated CM, EF 50-55% 2003  . Osteopenia     DEXA 2004  . Lipoma     left inner thigh  . Cataract     left eye  . VASCULITIS 07/27/2006  . Systolic CHF, chronic    Past Surgical History  Procedure Laterality Date  . Abdominal hysterectomy    . Salpingoophorectomy    . Thyroidectomy    . Right hip cemented hemiarthroplasty  10/2010    for Right hip fracture, femoral neck - by Eulas Post, MD   History reviewed. No pertinent family history. History  Substance Use Topics  . Smoking status: Never Smoker   . Smokeless tobacco: Never Used  .  Alcohol Use: No   OB History   Grav Para Term Preterm Abortions TAB SAB Ect Mult Living                 Review of Systems  Respiratory: Positive for shortness of breath.   Cardiovascular: Positive for chest pain.  All other systems reviewed and are negative.    Allergies  Review of patient's allergies indicates no known allergies.  Home Medications   Current Outpatient Rx  Name  Route  Sig  Dispense  Refill  . albuterol (PROVENTIL HFA;VENTOLIN HFA) 108 (90 BASE) MCG/ACT inhaler   Inhalation   Inhale 1-2 puffs into the lungs every 6 (six) hours as needed. Shortness of breath         . albuterol (PROVENTIL HFA;VENTOLIN HFA) 108 (90 BASE) MCG/ACT inhaler   Inhalation   Inhale 1-2 puffs into the lungs every 6 (six) hours as needed for wheezing.   1 Inhaler   0   . aspirin 81 MG tablet   Oral   Take 81 mg by mouth daily.         . brimonidine (ALPHAGAN P) 0.1 % SOLN   Left Eye   Place 1 drop into the left eye every 12 (twelve) hours.   15 mL   1   . dorzolamide (TRUSOPT) 2 %  ophthalmic solution   Left Eye   Place 1 drop into the left eye 2 (two) times daily.          . Fluticasone-Salmeterol (ADVAIR) 250-50 MCG/DOSE AEPB   Inhalation   Inhale 1 puff into the lungs every 12 (twelve) hours.         . furosemide (LASIX) 80 MG tablet   Oral   Take 1 tablet (80 mg total) by mouth 2 (two) times daily.   60 tablet   2   . lisinopril (PRINIVIL,ZESTRIL) 20 MG tablet      TAKE 1 TABLET BY MOUTH EVERY DAY   30 tablet   0   . lisinopril (PRINIVIL,ZESTRIL) 40 MG tablet   Oral   Take 40 mg by mouth daily.         . metFORMIN (GLUCOPHAGE) 1000 MG tablet   Oral   Take 1 tablet (1,000 mg total) by mouth 2 (two) times daily with a meal.   60 tablet   2   . metoprolol succinate (TOPROL-XL) 25 MG 24 hr tablet   Oral   Take 75 mg by mouth daily. Take with or immediately following a meal.         . potassium chloride (MICRO-K) 10 MEQ CR capsule   Oral    Take 20 mEq by mouth 2 (two) times daily.         . pravastatin (PRAVACHOL) 40 MG tablet   Oral   Take 1 tablet (40 mg total) by mouth every evening.   30 tablet   11    BP 141/89  Pulse 104  Temp(Src) 98.1 F (36.7 C) (Oral)  Resp 20  SpO2 100% Physical Exam  Nursing note and vitals reviewed. Constitutional: She is oriented to person, place, and time. She appears well-developed and well-nourished.  Non-toxic appearance. No distress.  HENT:  Head: Normocephalic and atraumatic.  Eyes: Conjunctivae, EOM and lids are normal. Pupils are equal, round, and reactive to light.  Neck: Normal range of motion. Neck supple. No tracheal deviation present. No mass present.  Cardiovascular: Normal rate, regular rhythm and normal heart sounds.  Exam reveals no gallop.   No murmur heard. Pulmonary/Chest: Effort normal and breath sounds normal. No stridor. No respiratory distress. She has no decreased breath sounds. She has no wheezes. She has no rhonchi. She has no rales.  Abdominal: Soft. Normal appearance and bowel sounds are normal. She exhibits no distension. There is no tenderness. There is no rebound and no CVA tenderness.  Musculoskeletal: Normal range of motion. She exhibits no edema and no tenderness.  Bilateral 2+ pitting edema lower extremity  Neurological: She is alert and oriented to person, place, and time. She has normal strength. No cranial nerve deficit or sensory deficit. GCS eye subscore is 4. GCS verbal subscore is 5. GCS motor subscore is 6.  Skin: Skin is warm and dry. No abrasion and no rash noted.  Psychiatric: She has a normal mood and affect. Her speech is normal and behavior is normal.    ED Course  Procedures (including critical care time) Labs Review Labs Reviewed  CBC  BASIC METABOLIC PANEL  PRO B NATRIURETIC PEPTIDE   Imaging Review No results found.  MDM  No diagnosis found.  Date: 05/17/2013  Rate: 106  Rhythm: atrial fibrillation  QRS Axis: left   Intervals: normal  ST/T Wave abnormalities: nonspecific ST changes  Conduction Disutrbances:none  Narrative Interpretation:   Old EKG Reviewed: none available  Patient with mild CHF here and given Lasix 60 mg IV push. Will be admitted by outpatient clinic  Toy Baker, MD 05/17/13 2134

## 2013-05-17 NOTE — ED Notes (Signed)
IV access attempted x2, IV team consulted for IV placement.

## 2013-05-18 ENCOUNTER — Encounter (HOSPITAL_COMMUNITY): Payer: Self-pay | Admitting: Internal Medicine

## 2013-05-18 ENCOUNTER — Other Ambulatory Visit: Payer: Self-pay

## 2013-05-18 DIAGNOSIS — I4891 Unspecified atrial fibrillation: Secondary | ICD-10-CM

## 2013-05-18 DIAGNOSIS — R7989 Other specified abnormal findings of blood chemistry: Secondary | ICD-10-CM

## 2013-05-18 DIAGNOSIS — I509 Heart failure, unspecified: Secondary | ICD-10-CM

## 2013-05-18 DIAGNOSIS — I498 Other specified cardiac arrhythmias: Secondary | ICD-10-CM

## 2013-05-18 DIAGNOSIS — I482 Chronic atrial fibrillation, unspecified: Secondary | ICD-10-CM | POA: Diagnosis present

## 2013-05-18 DIAGNOSIS — I3139 Other pericardial effusion (noninflammatory): Secondary | ICD-10-CM | POA: Diagnosis not present

## 2013-05-18 DIAGNOSIS — I313 Pericardial effusion (noninflammatory): Secondary | ICD-10-CM | POA: Diagnosis not present

## 2013-05-18 DIAGNOSIS — J45909 Unspecified asthma, uncomplicated: Secondary | ICD-10-CM | POA: Diagnosis present

## 2013-05-18 DIAGNOSIS — I5043 Acute on chronic combined systolic (congestive) and diastolic (congestive) heart failure: Principal | ICD-10-CM

## 2013-05-18 DIAGNOSIS — I5042 Chronic combined systolic (congestive) and diastolic (congestive) heart failure: Secondary | ICD-10-CM | POA: Diagnosis present

## 2013-05-18 DIAGNOSIS — I38 Endocarditis, valve unspecified: Secondary | ICD-10-CM | POA: Diagnosis present

## 2013-05-18 LAB — URINALYSIS, ROUTINE W REFLEX MICROSCOPIC
Bilirubin Urine: NEGATIVE
Leukocytes, UA: NEGATIVE
Nitrite: NEGATIVE
Specific Gravity, Urine: 1.007 (ref 1.005–1.030)
Urobilinogen, UA: 0.2 mg/dL (ref 0.0–1.0)
pH: 6 (ref 5.0–8.0)

## 2013-05-18 LAB — BASIC METABOLIC PANEL
CO2: 29 mEq/L (ref 19–32)
Calcium: 8.8 mg/dL (ref 8.4–10.5)
Chloride: 101 mEq/L (ref 96–112)
Creatinine, Ser: 0.75 mg/dL (ref 0.50–1.10)
GFR calc Af Amer: 87 mL/min — ABNORMAL LOW (ref >90)
Glucose, Bld: 163 mg/dL — ABNORMAL HIGH (ref 70–99)
Potassium: 3.5 mEq/L (ref 3.5–5.1)
Sodium: 140 mEq/L (ref 135–145)

## 2013-05-18 LAB — TROPONIN I
Troponin I: 0.37 ng/mL (ref <0.30)
Troponin I: 0.3 ng/mL (ref <0.30)

## 2013-05-18 LAB — CBC WITH DIFFERENTIAL/PLATELET
Basophils Relative: 1 % (ref 0–1)
Eosinophils Absolute: 0.2 10*3/uL (ref 0.0–0.7)
Eosinophils Relative: 5 % (ref 0–5)
HCT: 40.5 % (ref 36.0–46.0)
Hemoglobin: 13.3 g/dL (ref 12.0–15.0)
Lymphs Abs: 1 10*3/uL (ref 0.7–4.0)
MCH: 29.6 pg (ref 26.0–34.0)
MCV: 90.2 fL (ref 78.0–100.0)
Monocytes Absolute: 0.7 10*3/uL (ref 0.1–1.0)
Monocytes Relative: 21 % — ABNORMAL HIGH (ref 3–12)
Neutro Abs: 1.3 10*3/uL — ABNORMAL LOW (ref 1.7–7.7)
Platelets: 311 10*3/uL (ref 150–400)
RBC: 4.49 MIL/uL (ref 3.87–5.11)

## 2013-05-18 LAB — GLUCOSE, CAPILLARY
Glucose-Capillary: 113 mg/dL — ABNORMAL HIGH (ref 70–99)
Glucose-Capillary: 168 mg/dL — ABNORMAL HIGH (ref 70–99)
Glucose-Capillary: 150 mg/dL — ABNORMAL HIGH (ref 70–99)

## 2013-05-18 MED ORDER — ASPIRIN 81 MG PO CHEW
81.0000 mg | CHEWABLE_TABLET | Freq: Every day | ORAL | Status: DC
  Administered 2013-05-18 – 2013-05-19 (×2): 81 mg via ORAL
  Filled 2013-05-18 (×2): qty 1

## 2013-05-18 MED ORDER — SIMVASTATIN 20 MG PO TABS
20.0000 mg | ORAL_TABLET | Freq: Every day | ORAL | Status: DC
  Administered 2013-05-18 – 2013-05-19 (×2): 20 mg via ORAL
  Filled 2013-05-18 (×2): qty 1

## 2013-05-18 MED ORDER — COLLAGENASE 250 UNIT/GM EX OINT
TOPICAL_OINTMENT | Freq: Every day | CUTANEOUS | Status: DC
  Administered 2013-05-18: 12:00:00 via TOPICAL
  Administered 2013-05-19: 1 via TOPICAL
  Filled 2013-05-18: qty 30

## 2013-05-18 MED ORDER — METFORMIN HCL 500 MG PO TABS
1000.0000 mg | ORAL_TABLET | Freq: Two times a day (BID) | ORAL | Status: DC
  Administered 2013-05-18 (×2): 1000 mg via ORAL
  Filled 2013-05-18 (×5): qty 2

## 2013-05-18 MED ORDER — HEPARIN BOLUS VIA INFUSION
3000.0000 [IU] | Freq: Once | INTRAVENOUS | Status: AC
  Administered 2013-05-18: 16:00:00 3000 [IU] via INTRAVENOUS
  Filled 2013-05-18: qty 3000

## 2013-05-18 MED ORDER — FUROSEMIDE 10 MG/ML IJ SOLN
60.0000 mg | Freq: Two times a day (BID) | INTRAMUSCULAR | Status: DC
  Administered 2013-05-18: 10:00:00 60 mg via INTRAVENOUS
  Filled 2013-05-18 (×2): qty 6

## 2013-05-18 MED ORDER — ALBUTEROL SULFATE HFA 108 (90 BASE) MCG/ACT IN AERS
1.0000 | INHALATION_SPRAY | Freq: Four times a day (QID) | RESPIRATORY_TRACT | Status: DC | PRN
  Filled 2013-05-18: qty 6.7

## 2013-05-18 MED ORDER — POTASSIUM CHLORIDE CRYS ER 20 MEQ PO TBCR
20.0000 meq | EXTENDED_RELEASE_TABLET | Freq: Two times a day (BID) | ORAL | Status: DC
  Administered 2013-05-18 – 2013-05-19 (×4): 20 meq via ORAL
  Filled 2013-05-18 (×5): qty 1

## 2013-05-18 MED ORDER — METOPROLOL SUCCINATE ER 50 MG PO TB24
50.0000 mg | ORAL_TABLET | Freq: Every day | ORAL | Status: DC
  Administered 2013-05-18: 17:00:00 50 mg via ORAL
  Filled 2013-05-18 (×3): qty 1

## 2013-05-18 MED ORDER — DIGOXIN 125 MCG PO TABS
0.1250 mg | ORAL_TABLET | Freq: Every day | ORAL | Status: DC
  Administered 2013-05-18 – 2013-05-19 (×2): 0.125 mg via ORAL
  Filled 2013-05-18 (×3): qty 1

## 2013-05-18 MED ORDER — FUROSEMIDE 10 MG/ML IJ SOLN
60.0000 mg | Freq: Two times a day (BID) | INTRAMUSCULAR | Status: DC
  Administered 2013-05-18: 60 mg via INTRAVENOUS
  Filled 2013-05-18 (×2): qty 6

## 2013-05-18 MED ORDER — INSULIN ASPART 100 UNIT/ML ~~LOC~~ SOLN
0.0000 [IU] | Freq: Three times a day (TID) | SUBCUTANEOUS | Status: DC
  Administered 2013-05-18: 17:00:00 2 [IU] via SUBCUTANEOUS
  Administered 2013-05-18: 3 [IU] via SUBCUTANEOUS
  Administered 2013-05-19 (×2): 2 [IU] via SUBCUTANEOUS
  Administered 2013-05-19: 13:00:00 3 [IU] via SUBCUTANEOUS

## 2013-05-18 MED ORDER — SODIUM CHLORIDE 0.9 % IJ SOLN
3.0000 mL | Freq: Two times a day (BID) | INTRAMUSCULAR | Status: DC
  Administered 2013-05-18 – 2013-05-19 (×3): 3 mL via INTRAVENOUS

## 2013-05-18 MED ORDER — ENOXAPARIN SODIUM 40 MG/0.4ML ~~LOC~~ SOLN
40.0000 mg | SUBCUTANEOUS | Status: DC
  Administered 2013-05-18: 07:00:00 40 mg via SUBCUTANEOUS
  Filled 2013-05-18 (×2): qty 0.4

## 2013-05-18 MED ORDER — LISINOPRIL 20 MG PO TABS
20.0000 mg | ORAL_TABLET | Freq: Every day | ORAL | Status: DC
Start: 2013-05-18 — End: 2013-05-19
  Administered 2013-05-18 – 2013-05-19 (×2): 20 mg via ORAL
  Filled 2013-05-18 (×2): qty 1

## 2013-05-18 MED ORDER — DORZOLAMIDE HCL 2 % OP SOLN
1.0000 [drp] | Freq: Two times a day (BID) | OPHTHALMIC | Status: DC
  Administered 2013-05-18 – 2013-05-19 (×4): 1 [drp] via OPHTHALMIC
  Filled 2013-05-18: qty 10

## 2013-05-18 MED ORDER — HEPARIN (PORCINE) IN NACL 100-0.45 UNIT/ML-% IJ SOLN
1250.0000 [IU]/h | INTRAMUSCULAR | Status: AC
  Administered 2013-05-18: 1050 [IU]/h via INTRAVENOUS
  Administered 2013-05-19: 14:00:00 1250 [IU]/h via INTRAVENOUS
  Filled 2013-05-18 (×3): qty 250

## 2013-05-18 MED ORDER — ASPIRIN 81 MG PO TABS: 81.0000 mg | ORAL_TABLET | Freq: Every day | ORAL | Status: DC

## 2013-05-18 NOTE — Progress Notes (Signed)
ANTICOAGULATION CONSULT NOTE - Initial Consult  Pharmacy Consult for Heparin Indication: atrial fibrillation  No Known Allergies  Patient Measurements: Height: 5\' 4"  (162.6 cm) Weight: 171 lb 8.3 oz (77.8 kg) (Bed Scale) IBW/kg (Calculated) : 54.7  Vital Signs: Temp: 97.2 F (36.2 C) (10/01 1403) Temp src: Oral (10/01 1403) BP: 107/70 mmHg (10/01 1403) Pulse Rate: 53 (10/01 1403)  Labs:  Recent Labs  05/17/13 1954 05/18/13 0220 05/18/13 0941 05/18/13 1427  HGB 12.6  --  13.3  --   HCT 37.1  --  40.5  --   PLT 282  --  311  --   CREATININE 0.76  --  0.75  --   TROPONINI  --  0.37* 0.30* 0.35*    Estimated Creatinine Clearance: 51.9 ml/min (by C-G formula based on Cr of 0.75).   Medical History: Past Medical History  Diagnosis Date  . Hypertension   . Diabetes mellitus   . Asthma   . Osteoarthritis     left shoulder  . Pseudogout   . Hypokalemia     a. Previously felt due to diuretics, req supplementation.  . Churg-Strauss syndrome     a. Sural nerve biopsy 02/2000.  Darene Lamer disease     a. s/p thyroidectomy.  . Glaucoma   . Hypercholesteremia   . Venous insufficiency   . Osteopenia     DEXA 2004  . Lipoma     left inner thigh  . Cataract     left eye  . VASCULITIS   . Chronic combined systolic and diastolic heart failure     a. EF 25-35% 2001 req diuresis, thoracentesis. b. EF improved to 50-55% by echo 2003. c. EF 20-25% by echo 02/2012 with mild AI, mod MR/TR, PAP , sm-mod pericardial effusion --> subsequent abnormal Myoview. Treated medically. ?Tachy-mediated.  . Pleural effusion, left     a. Thoracentesis 2001 - per notes, no malignant cells, was transudative.  Marland Kitchen NSVT (nonsustained ventricular tachycardia)     a. During CHF adm 2001.  . Multifocal atrial tachycardia     a. Documented as OP 01/2012.  Marland Kitchen Ectopic atrial tachycardia     a. Documented on tele as IP 02/2012.  . Pulmonary HTN     a. Mod by echo 02/2012.  . Valvular heart disease      a. Echo 02/2012: mod MR/TR, mild AI.  Marland Kitchen Pericardial effusion     a. Echo 02/2012: small-mod pericardial effusion.    Assessment: 79 year old admitted with Afib.  Beginning IV heparin.  Goal of Therapy:  Heparin level 0.3-0.7 units/ml Monitor platelets by anticoagulation protocol: Yes   Plan:  1) Heparin 3000 units iv bolus x 1 2) Heparin drip at 1050 units / hr 3) Daily heparin level, CBC  Thank you. Okey Regal, PharmD [phone]  05/18/2013,3:54 PM

## 2013-05-18 NOTE — Progress Notes (Signed)
Student Pharmacist rounding with [facility], asked to consult for atrial fibrillation anticoagulation in this 79 year old female who was admitted to the hospital on May 17, 2013 with shortness of breath and lower extremity edema.  She has a past medical history significant for diabetes, hypertension, dilated cardiomyopathy, Grave's disease (status-post thyroidectomy), GERD, and glaucoma.  Upon examination, she denies chest pain, shortness of breath, orthopnea, and worsening of leg swelling.  Have evaluated her baseline laboratory values as documented in her history and physical exam.  Her blood pressure is 144/111, pulse 116, respirations 19, SpO2 96% room air, CrCl 47.5 mL/min.    Due to the patient having heart failure, hypertension, diabetes, and being an 79 year old female, her calculated CHA2DS2-VASc score was found to be 6.  A score of 6 places the patient at an annual risk of stroke and thromboembolism with no antithrombotic therapy at 12.5%; thus, it is imperative that this patient is anticoagulated.  Given the fact that she was not compliant with her recent antibiotic and her fragility, I would recommend the initiation of a NOAC over the initiation of warfarin for the management of atrial fibrillation.  As her calculated CrCl is 47.5 mL/min, rivaroxaban should be initiated at a dose of 15 mg by mouth daily with food.  As an alternative, she may also be initiated on dabigatran 150 mg by mouth twice daily.  However, dabigatran may worsen her preexisting condition GERD.  Since she does not have an elevated serum creatinine nor is malnourished, a final option would consist of initiating the patient on apixaban 5 mg by mouth twice daily.  All of the aforementioned options should be initiated once the continuous unfractionated heparin drip has been discontinued.  Upon examination of the above options, I would suggest initiating her on rivaroxaban 15 mg by mouth daily with food.  As  the patient is likely fragile, she will most likely receive greater benefits with rivaroxaban therapy.  A sub-study analysis in the ROCKET-AF trial demonstrated rivaroxaban to be equally efficacious to warfarin, yet found increased safety with rivaroxaban use compared to that of warfarin in "fragile" patients.  Additionally, this therapy is once daily and increases the possibility that she will be compliant with this regimen.  Thank you for the opportunity to see this patient.  Russ Halo, PharmD Candidate

## 2013-05-18 NOTE — Progress Notes (Signed)
Pt a/o, no c/o pain, pt has wound to right shin, dressing applied per WOC RN orders

## 2013-05-18 NOTE — Progress Notes (Signed)
pts HR was in 130's-150's, PA Danna Dunn notified, metoprolol and digoxin given as ordered, pts HR now 100-115's, pt started on heparin gtt at 10.5cc/hr

## 2013-05-18 NOTE — Consult Note (Signed)
Cardiology Consultation Note  Patient ID: [name], MRN: [id number], DOB/AGE: 1926-07-17 79 y.o. Admit date: 05/17/2013   Date of Consult: 05/18/2013 Primary Physician: Evelena Peat, DO Primary Cardiologist: Seen by Dr. Daleen Squibb & Dr. Jens Som during 02/2012 admission but has not been seen as outpatient  Chief Complaint: SOB, LEE Reason for Consult: acute on chronic systolic CHF, newly recognized atrial fibrillation  HPI: [name] is an 79 y/o F with history of systolic CHF, pericardial effusion, moderate pulm HTN, mod MR/TR, HTN, DM, multifocal atrial tachycardia, Churg-strauss syndrome who presented to [facility] 05/17/2013 with SOB and newly recognized atrial fibrillation. Cardiac history dates back to 2001 when she was admitted for systolic CHF with EF 25-35%. It doesn't appear she ever had cath. F/u echo 2003 showed improved EF. However, in 02/2012 she returned to the hospital with CHF and was found to have EF 20-25% by echo 02/2012 with mild AI, mod MR/TR, PAP , small-mod pericardial effusion. She underwent subsequent Myoview which was abnormal (see below). Per notes, she was treated medically. She did have ectopic atrial tach documented during that admission so Dr. Jens Som wondered if she had tachy-mediated cardiomyopathy. She has been on beta blocker for this. She has been doing OK recently except for a recent non-healing RLE wound for which she was prescribed doxycycline but never picked up rx or went to wound care center as instructed.  She presented to [facility] yesterday with SOB with DOE/orthopnea x 2 days and progressive LEE for several weeks. She had been out of her Lasix for 2 days as she was waiting for a family member to pick it up from the pharmacy. She lives alone but has several relatives look in on her regularly. She otherwise reports med and diet compliance (does eat some canned foods but always drains the liquid, makes sure to eat unsalted crackers, still  tries to cook a lot from "raw foods"). She had an episode of chest discomfort for 5 minutes in the acute phase of dyspnea which reminded her of a prior asthma attack, prompting her to seek care. She also felt heart palpitations. She walks with a walker and denies any other episodes of CP including with exertion. On admission she was found to have elevated HR of 104, BP 141/89, pBNP 4185, troponins 0.05->0.37->0.30. She is in a newly documented atrial fibrillation. She has been treated with IV lasix 60mg  x 2 doses so far with improvement in sx/diuresis. She is no longer SOB and is able to lie near flat without symptoms. Overall her weight is actually down from a few weeks ago (184) - was 175 on admission, down to 171 with -4.5L diuresis so far. Metoprolol was held due to acute CHF. She denies any recent falls.  Wt Readings from Last 3 Encounters:  05/18/13 171 lb 8.3 oz (77.8 kg)  05/02/13 184 lb 6.4 oz (83.643 kg)  03/16/13 182 lb 6.4 oz (82.736 kg)    Past Medical History  Diagnosis Date  . Hypertension   . Diabetes mellitus   . Asthma   . Osteoarthritis     left shoulder  . Pseudogout   . Hypokalemia     a. Previously felt due to diuretics, req supplementation.  . Churg-Strauss syndrome     a. Sural nerve biopsy 02/2000.  Darene Lamer disease     a. s/p thyroidectomy.  . Glaucoma   . Hypercholesteremia   . Venous insufficiency   . Osteopenia     DEXA  2004  . Lipoma     left inner thigh  . Cataract     left eye  . VASCULITIS   . Chronic combined systolic and diastolic heart failure     a. EF 25-35% 2001 req diuresis, thoracentesis. b. EF improved to 50-55% by echo 2003. c. EF 20-25% by echo 02/2012 with mild AI, mod MR/TR, PAP , sm-mod pericardial effusion --> subsequent abnormal Myoview. Treated medically. ?Tachy-mediated.  . Pleural effusion, left     a. Thoracentesis 2001 - per notes, no malignant cells, was transudative.  Marland Kitchen NSVT (nonsustained ventricular tachycardia)     a.  During CHF adm 2001.  . Multifocal atrial tachycardia     a. Documented as OP 01/2012.  Marland Kitchen Ectopic atrial tachycardia     a. Documented on tele as IP 02/2012.  . Pulmonary HTN     a. Mod by echo 02/2012.  . Valvular heart disease     a. Echo 02/2012: mod MR/TR, mild AI.  Marland Kitchen Pericardial effusion     a. Echo 02/2012: small-mod pericardial effusion.    No prior history of stroke or mini stroke  Most Recent Cardiac Studies: 2D echo 03/04/12 - Left ventricle: The cavity size was normal. Wall thickness was increased in a pattern of moderate LVH. Systolic function was severely reduced. The estimated ejection fraction was in the range of 20% to 25%. there were no regional wall motion abnormalities. - Aortic valve: Mild regurgitation. - Mitral valve: Moderate regurgitation. - Right atrium: The atrium was moderately dilated. - Tricuspid valve: Moderate regurgitation. - Pulmonary arteries: Systolic pressure was moderately increased. PA peak pressure: 56mm Hg (S). - Pericardium, extracardiac: A small to moderate pericardial effusion was identified. LA size = 59mm  2D Echo 2003 SUMMARY - Overall left ventricular systolic function was at the lower limits of normal. Left ventricular ejection fraction was estimated , range being 50 % to 55 %. Although no diagnostic left ventricular regional wall motion abnormality was identified, this possibility cannot be completely excluded on the basis of this study. Left ventricular wall thickness was increased. Doppler parameters were consistent with abnormal left ventricular relaxation. - Aortic valve thickness was mildly increased. There was mild aortic valvular regurgitation. - There was mild mitral annular calcification. There was mild mitral valvular regurgitation. - The left atrium was dilated. The possibility of a patent foramen ovale cannot be excluded on the basis of this study.  Nuclear Stress Test 03/11/12 1. EF 17% with dilated LV and global  hypokinesis. 2. Primarily fixed basal to mid inferolateral perfusion defect could represent prior infarction with minimal peri-infarct ischemia. The reversible, mild mid-anteroseptal perfusion defect may be attenuation from shifting breast artifact but cannot rule out ischemia. 3. The above noted perfusion defects do not seem profound enough to have caused the degree of cardiomyopathy that is present.   Surgical History:  Past Surgical History  Procedure Laterality Date  . Abdominal hysterectomy    . Salpingoophorectomy    . Thyroidectomy    . Right hip cemented hemiarthroplasty  10/2010    for Right hip fracture, femoral neck - by Eulas Post, MD     Home Meds: Prior to Admission medications   Medication Sig Start Date End Date Taking? Authorizing Provider  albuterol (PROVENTIL HFA;VENTOLIN HFA) 108 (90 BASE) MCG/ACT inhaler Inhale 1-2 puffs into the lungs every 6 (six) hours as needed. Shortness of breath   Yes Historical Provider, MD  aspirin 81 MG tablet Take 81 mg by mouth daily.  Yes Historical Provider, MD  brimonidine (ALPHAGAN P) 0.1 % SOLN Place 1 drop into the left eye every 12 (twelve) hours. 05/27/12  Yes Lars Mage, MD  dorzolamide (TRUSOPT) 2 % ophthalmic solution Place 1 drop into the left eye 2 (two) times daily.    Yes Historical Provider, MD  furosemide (LASIX) 80 MG tablet Take 80 mg by mouth 2 (two) times daily.   Yes Historical Provider, MD  lisinopril (PRINIVIL,ZESTRIL) 20 MG tablet Take 20 mg by mouth daily.   Yes Historical Provider, MD  metFORMIN (GLUCOPHAGE) 1000 MG tablet Take 1 tablet (1,000 mg total) by mouth 2 (two) times daily with a meal. 03/16/13  Yes Evelena Peat, DO  metoprolol succinate (TOPROL-XL) 100 MG 24 hr tablet Take 100 mg by mouth daily. Take with or immediately following a meal.   Yes Historical Provider, MD  potassium chloride (MICRO-K) 10 MEQ CR capsule Take 20 mEq by mouth 2 (two) times daily. 03/16/13  Yes Evelena Peat, DO  pravastatin  (PRAVACHOL) 40 MG tablet Take 1 tablet (40 mg total) by mouth every evening. 10/11/12 10/11/13 Yes Lars Mage, MD    Inpatient Medications:  . aspirin  81 mg Oral Daily  . collagenase   Topical Daily  . dorzolamide  1 drop Left Eye BID  . enoxaparin (LOVENOX) injection  40 mg Subcutaneous Q24H  . furosemide  60 mg Intravenous BID  . insulin aspart  0-15 Units Subcutaneous TID WC  . lisinopril  20 mg Oral Daily  . metFORMIN  1,000 mg Oral BID WC  . potassium chloride  20 mEq Oral BID  . simvastatin  20 mg Oral q1800  . sodium chloride  3 mL Intravenous Q12H      Allergies: No Known Allergies  History   Social History  . Marital Status: Widowed    Spouse Name: N/A    Number of Children: N/A  . Years of Education: N/A   Occupational History  . Retired    Social History Main Topics  . Smoking status: Never Smoker   . Smokeless tobacco: Never Used  . Alcohol Use: No  . Drug Use: No  . Sexual Activity: No   Other Topics Concern  . Not on file   Social History Narrative   Lives in house, with family just next door.    Never smoker, no alcohol.      Used to work at [employer].    Before that, worked in food prep.                    Family History  Problem Relation Age of Onset  . Diabetes Mother   . Diabetes Father   . Heart disease Neg Hx     Review of Systems: General: negative for chills, fever, night sweats.  Cardiovascular: see above Dermatological: negative for rash Respiratory: negative for cough or wheezing Urologic: negative for hematuria Abdominal: negative for nausea, vomiting, diarrhea, bright red blood per rectum, melena, or hematemesis Neurologic: negative for visual changes, syncope, or dizziness All other systems reviewed and are otherwise negative except as noted above.  Labs:  Recent Labs  05/18/13 0220 05/18/13 0941  TROPONINI 0.37* 0.30*   Lab Results  Component Value Date   WBC 3.2* 05/18/2013   HGB 13.3  05/18/2013   HCT 40.5 05/18/2013   MCV 90.2 05/18/2013   PLT 311 05/18/2013    Recent Labs Lab 05/18/13 0941  NA 140  K 3.5  CL  101  CO2 29  BUN 19  CREATININE 0.75  CALCIUM 8.8  GLUCOSE 163*   Lab Results  Component Value Date   CHOL 132 03/16/2013   HDL 54 03/16/2013   LDLCALC 63 03/16/2013   TRIG 74 03/16/2013    Radiology/Studies:  Dg Chest 2 View 05/17/2013   CLINICAL DATA:  Shortness of breath and chest pain.  EXAM: CHEST  2 VIEW  COMPARISON:  04/17/2013.  FINDINGS: The heart is enlarged but stable. There is tortuosity, ectasia and calcification of the thoracic aorta. Prominent mediastinal and hilar contours are unchanged. There is central vascular congestion but no overt pulmonary edema. No pleural effusions. The bony thorax is intact.  IMPRESSION: Stable cardiac enlargement and moderate central vascular congestion without overt pulmonary edema.   Electronically Signed   By: Loralie Champagne M.D.   On: 05/17/2013 19:49   EKG:  10/1: atrial fib 81bpm inferior infarct age undetermined, anterior infarct age undetermined, nonspecific ST-T changes Admit EKG yesterday similar EKGs appear similar to 01/2012 & 03/2012 except now appearing like more typical afib  Physical Exam: Blood pressure 107/70, pulse 106, temperature 97.2 F (36.2 C), temperature source Oral, resp. rate 20, height 5\' 4"  (1.626 m), weight 171 lb 8.3 oz (77.8 kg), SpO2 96.00%. General: Well developed, well nourished AAF in no acute distress. She is able to lie at reclined comfortably at about 20 degrees in bed without any dyspnea. Head: Normocephalic, atraumatic, sclera non-icteric, no xanthomas, nares are without discharge. Bilateral ptosis of lids. Neck: Negative for carotid bruits. JVD not elevated. Lungs: Diminished BS at bases. Faint exp wheezes at lung apices. Otherwise no rales or rhonchi. Breathing is unlabored. Heart: RRR with S1 S2. No murmurs, rubs, or gallops appreciated. Abdomen: Soft, non-tender,  non-distended with normoactive bowel sounds. No hepatomegaly. No rebound/guarding. No obvious abdominal masses. Msk:  Strength and tone appear normal for age. Extremities: No clubbing or cyanosis. Tr-1+bilat LE edema.  Distal pedal pulses are 2+ and equal bilaterally. Neuro: Alert and oriented X 3. No facial asymmetry. No focal deficit. Moves all extremities spontaneously. Psych:  Responds to questions appropriately with a normal affect.   Assessment and Plan:   1. Acute on chronic combined systolic and diastolic CHF, exacerbation likely due to combination of afib & being out of Lasix for 2 days 2. Newly recognized atrial fibrillation with RVR (CHADS2VASC=6) 3. Elevated troponin this admission with history of abnormal myoview 02/2012, likely reflecting demand ischemia instead of actual MI 4. HTN 5. Diabetes mellitus type II 6. Asthma 7. H/o valvular heart disease (mod MR/TR, mild AI by echo 02/2012) 8. H/o sm-mod pericardial effusion by echo 02/2012 9. H/o Churg-strauss syndrome  10. H/o multifocal atrial tachycardia & ectopic atrial tachycardia  Plan: - Continue ACEI - Resume metoprolol at 1/2 dose - may be difficult to push up beta blocker due to asthma. We have elected metoprolol over coreg for better rate control. - Add digoxin for rate control (will need to follow this as outpt - note HR of "53" in computer was an entry error) - F/u TSH - Continue IV Lasix through today given brisk diuresis - likely change to oral in AM - PT/OT eval to assess her functionality - We feel she requires anticoagulation given high CHADSVASC score. May be a candidate for Eliquis but will ask care management to assess cost of NOACs. Start IV heparin per pharmacy until info is available. - Consider addition of spironolactone - Re: elevated troponin, continue to manage medically at  this time due to poverty of anginal symptoms. If she develops chest pain, could reconsider.  Signed, Ronie Spies PA-C 05/18/2013,  2:28 PM   The patient was seen, examined and discussed with Ronie Spies, PA-C and I agree with the above.  I summary, Mrs Lowery was admitted with acute CHF, most likely as a combination of a-fib with RVR and medication non-compliance in the last few days prior to the admission. The patient responded well to iv Lasix. Almost euvolemic, we will continue with low dose Lasix.  Regarding her atrial fibrillation, it is of unknown duration, her LA on the 2013 echo measures 5.9 cm and its highly unlikely she will be able to maintain SR if cardioverted. Therefore we will prefer rate control with Metoprolol and Digoxin.  Because her CHADS2-VASc score is 6 we will start her on anticoagulation with Heparin drip and consult care management for evaluation of the cost of any new anticoagulants. Otherwise we will start her on Coumadin. TSH is pending. Minimal troponin elevation, now downtrending, most probably in the settings of afib with RVR, prior pharmacologic nuclear stress test in 2013 showed small scar in the basal to mid inferolateral perfusion defect that could represent prior infarction with minimal peri-infarct ischemia. This was out of proportion to the degree of the LV dysfunction so her etiology is most probably combination of ischemic and non-ischemic. We will continue medical therapy for now.

## 2013-05-18 NOTE — Progress Notes (Signed)
CRITICAL VALUE ALERT  Critical value received:  Troponin 0.37  Date of notification:  05/18/13  Time of notification:  0343  Critical value read back:yes  Nurse who received alert:  Leanor Kail  MD notified (1st page):  Rocco Serene  Time of first page:  [phone]  MD notified (2nd page):  Time of second page:  Responding MD:  Rocco Serene  Time MD responded:  [phone]

## 2013-05-18 NOTE — Progress Notes (Signed)
Patient evaluated for community based chronic disease management services with [facility] Care Management Program as a benefit of patient's [employer]. Spoke with patient at bedside to explain [facility] Care Management services.  Patient will receive a post discharge transition of care call and will be evaluated for monthly home visits for assessments and CHF disease process education.  Left contact information and [facility] literature at bedside. Made inpatient Case Manager aware that [facility] Care Management following. Of note, [facility] Care Management services does not replace or interfere with any services that are arranged by inpatient case management or social work.  For additional questions or referrals please contact Anibal Henderson BSN RN [facility] Liaison at [phone].

## 2013-05-18 NOTE — Progress Notes (Signed)
Subjective: Patient seen at the bedside. She feels well. This morning she denies chest pain, shortness of breath, orthopnea, PND, worsening leg swelling. She is lying flat in bed without symptoms. She does not recall taking any beta blockers. She has never been told she has an irregular heart rhythm. She also does not think she takes any medicines for her thyroid disease.  Care management stopped by to talk to her about [facility]. Patient will receive a post discharge transition of care call and will be evaluated for monthly home visits for assessments and CHF disease process education. I think this will be a very positive thing for her moving forward, especially if she returns to living alone.   Objective: Vital signs in last 24 hours: Filed Vitals:   05/18/13 0152 05/18/13 0519 05/18/13 0929 05/18/13 1403  BP: 144/91 144/83 145/94 107/70  Pulse: 103 78    Temp: 97.3 F (36.3 C) 97.7 F (36.5 C)  97.2 F (36.2 C)  TempSrc: Oral Oral  Oral  Resp: 21 19  20   Height: 5\' 4"  (1.626 m)     Weight: 171 lb 8.3 oz (77.8 kg)     SpO2: 94% 100%  96%   Weight change:   Intake/Output Summary (Last 24 hours) at 05/18/13 1649 Last data filed at 05/18/13 1428  Gross per 24 hour  Intake    600 ml  Output   5075 ml  Net  -4475 ml   Physical Exam:  General: alert, cooperative, and in no apparent distress HEENT: NCAT pupils equal round and reactive to light, vision grossly intact, oropharynx clear and non-erythematous  Neck: supple, no lymphadenopathy, no JVP Lungs: CTAP, normal work of respiration, no rhonchi Heart: irregularly irregular rate, 2/6 systolic murmur at LLSB. No gallops or rubs. She does have a diffuse PMI. Abdomen: soft, non-tender, non-distended, normal bowel sounds  Extremities: 1+ pitting edema to mid-shins of right lower extremity, 2+ of left lower extremity, no pain to palpation of bilateral calves; wound on right pretibial skin with scab, no surrounding erythema, no odor; warm  well perfused, no cyanosis, clubbing Neurologic: alert & oriented X3, cranial nerves II-XII intact, strength grossly intact, sensation intact to light touch   Lab Results: Basic Metabolic Panel:  Recent Labs Lab 05/17/13 1954 05/18/13 0941  NA 138 140  K 4.0 3.5  CL 103 101  CO2 21 29  GLUCOSE 121* 163*  BUN 23 19  CREATININE 0.76 0.75  CALCIUM 8.7 8.8   CBC:  Recent Labs Lab 05/17/13 1954 05/18/13 0941  WBC 3.4* 3.2*  NEUTROABS  --  1.3*  HGB 12.6 13.3  HCT 37.1 40.5  MCV 89.8 90.2  PLT 282 311   Cardiac Enzymes:  Recent Labs Lab 05/18/13 0220 05/18/13 0941 05/18/13 1427  TROPONINI 0.37* 0.30* 0.35*   BNP:  Recent Labs Lab 05/17/13 1955  PROBNP 4185.0*   CBG:  Recent Labs Lab 05/18/13 0246 05/18/13 0631 05/18/13 1059 05/18/13 1608  GLUCAP 128* 113* 168* 150*   Urinalysis:  Recent Labs Lab 05/18/13 0016  COLORURINE YELLOW  LABSPEC 1.007  PHURINE 6.0  GLUCOSEU NEGATIVE  HGBUR NEGATIVE  BILIRUBINUR NEGATIVE  KETONESUR NEGATIVE  PROTEINUR NEGATIVE  UROBILINOGEN 0.2  NITRITE NEGATIVE  LEUKOCYTESUR NEGATIVE    Studies/Results: Dg Chest 2 View  05/17/2013   CLINICAL DATA:  Shortness of breath and chest pain.  EXAM: CHEST  2 VIEW  COMPARISON:  04/17/2013.  FINDINGS: The heart is enlarged but stable. There is tortuosity, ectasia and  calcification of the thoracic aorta. Prominent mediastinal and hilar contours are unchanged. There is central vascular congestion but no overt pulmonary edema. No pleural effusions. The bony thorax is intact.  IMPRESSION: Stable cardiac enlargement and moderate central vascular congestion without overt pulmonary edema.   Electronically Signed   By: Loralie Champagne M.D.   On: 05/17/2013 19:49   Medications: I have reviewed the patient's current medications. Scheduled Meds: . aspirin  81 mg Oral Daily  . collagenase   Topical Daily  . digoxin  0.125 mg Oral Daily  . dorzolamide  1 drop Left Eye BID  . furosemide   60 mg Intravenous BID  . heparin  3,000 Units Intravenous Once  . insulin aspart  0-15 Units Subcutaneous TID WC  . lisinopril  20 mg Oral Daily  . metFORMIN  1,000 mg Oral BID WC  . metoprolol succinate  50 mg Oral Daily  . potassium chloride  20 mEq Oral BID  . simvastatin  20 mg Oral q1800  . sodium chloride  3 mL Intravenous Q12H   Continuous Infusions: . heparin     PRN Meds:.albuterol  Assessment/Plan: #Acute on chronic heart failure (combined systolic and diastolic) - Patient appears nearly euvolemic. She has no orthopnea, no JVD, no SOB, no suggestion of pulmonary edema on lung exam. Her weight is down from 175 on admission to 171 this morning, with 4.5 liters diuresis status post IV Lasix. Cardiology came by to see her this afternoon. They agree that this is likely acute on chronic combined systolic and diastolic heart failure exacerbation, likely due to new-onset atrial fibrillation and perhaps also some degree of noncompliance with her Lasix. They recommended adding digoxin. - Appreciate cardiology recommendations - Continue ACE inhibitor, statin, ASA - Continue Lasix 60 mg IV BID with plans to change to po in the morning - Continue home K-Dur for hypokalemia - Daily weights - Strict Is and Os (foley catheter in place)  - PT/OT evaluate and treat to assess functionality  #Atrial fibrillation- CHADS vasc score 6 (moderate to high). Her left atrium on 2013 echo was enlarged at 5.9cm, so per cardiology it is highly unlikely she will be able to maintain SR if cardioverted. We will therefore manage her medically with metoprolol and digoxin. Cardiology does feel that she needs anticoagulation and perhaps would be a good candidate for NOACs, but we need to involve care management to assess the cost. Other option is of course coumadin. - Resuming at metoprolol at 50 mg daily - Adding digoxin 0.125mg  daily for rate control, this will require some outpatient followup - Heparin gtt per  pharmacy for anticoagulation in the setting of atrial fibrillation - Follow up TSH  #Elevated troponins - 0.37>0.30>0.35. Cardiology feels her mildly elevated troponins likely reflect demand ischemia, not MI. Prior nuclear stress test in 2013 did show a small basal- to mid-inferolateral perfusion defect that could represent prior infarction with minimal peri-infarct ischemia. Patient denies chest pain or other anginal symptoms at this time. - Continue to manage medically  #Right pretibial wound - Stable, no obvious drainage, no erythema. Pt did not take the doxycycline she was prescribed by [facility] earlier this month and did not follow-up with wound care as outpatient.  - Appreciate wound care recommendations - Enzymatic debridement ointment to be applied daily   #DM2- A1C 7.8 on 7/30.14. - Continue home metformin 1000mg  BID - CBGs before meals and qhs  - SSI moderate  - Heart healthy carb mod diet   #HTN -  Stable, BP 140s/80s-90s. - Continue home medications  # DVT PPX - Heparin gtt   Dispo: Disposition is deferred at this time, awaiting improvement of current medical problems.  Anticipated discharge in approximately 1-3 day(s).   The patient does have a current PCP Evelena Peat, DO) and does need an [facility] hospital follow-up appointment after discharge.  The patient does not have transportation limitations that hinder transportation to clinic appointments.  .Services Needed at time of discharge: Y = Yes, Blank = No PT:   OT:   RN:   Equipment:   Other:     LOS: 1 day   Vivi Barrack, MD 05/18/2013, 4:49 PM

## 2013-05-18 NOTE — Consult Note (Signed)
WOC consult Note Reason for Consult: evaluation wound RLE. Pt reports she fell 3 months ago and was unable to get herself up from lying face down position, her son was unable to lift her completely and was forced to "drag her in the house". She sustained a injury to the pretibial area at that time. She reports it was bigger and has since healed some, she has been using H2O2.  I was not able to palpate her pulses easily, so I checked with doppler and I was able to obtain by doppler.  Wound type: non healing trauma wound. Measurement: 4cm x 2.5cm x 0.2cm  Wound bed:90% necrotic small visible pink area  Drainage (amount, consistency, odor) expressed yellow thick pus with palpation of the wound. Periwound: intact Dressing procedure/placement/frequency: enzymatic debridement ointment to be applied daily, cover with moist 2x2 gauze, wrap with kerlix. Change daily.   Suggested her family or the patient continue this until the wound bed is clear of necrotic tissue, then moist wound healing with saline gauze until healed.  Discussed POC with patient and bedside nurse.  Re consult if needed, will not follow at this time. Thanks  Mariana Goytia [employer], CWOCN [phone])

## 2013-05-18 NOTE — Progress Notes (Signed)
Utilization Review Completed Adriel Kessen J. Nicol Herbig, RN, BSN, NCM [phone]  

## 2013-05-19 LAB — CBC
HCT: 40 % (ref 36.0–46.0)
MCH: 28.9 pg (ref 26.0–34.0)
MCHC: 31.8 g/dL (ref 30.0–36.0)
MCV: 91.1 fL (ref 78.0–100.0)
Platelets: 282 10*3/uL (ref 150–400)
RBC: 4.39 MIL/uL (ref 3.87–5.11)

## 2013-05-19 LAB — BASIC METABOLIC PANEL
BUN: 14 mg/dL (ref 6–23)
Chloride: 97 mEq/L (ref 96–112)
Creatinine, Ser: 0.65 mg/dL (ref 0.50–1.10)
Glucose, Bld: 166 mg/dL — ABNORMAL HIGH (ref 70–99)
Potassium: 4.3 mEq/L (ref 3.5–5.1)

## 2013-05-19 LAB — GLUCOSE, CAPILLARY: Glucose-Capillary: 133 mg/dL — ABNORMAL HIGH (ref 70–99)

## 2013-05-19 MED ORDER — COLLAGENASE 250 UNIT/GM EX OINT: TOPICAL_OINTMENT | Freq: Every day | CUTANEOUS | Status: DC

## 2013-05-19 MED ORDER — FUROSEMIDE 80 MG PO TABS
80.0000 mg | ORAL_TABLET | Freq: Two times a day (BID) | ORAL | Status: DC
  Administered 2013-05-19: 18:00:00 80 mg via ORAL
  Filled 2013-05-19 (×2): qty 1

## 2013-05-19 MED ORDER — RIVAROXABAN 15 MG PO TABS: 15.0000 mg | ORAL_TABLET | Freq: Every day | ORAL | Status: DC

## 2013-05-19 MED ORDER — METOPROLOL SUCCINATE ER 100 MG PO TB24
100.0000 mg | ORAL_TABLET | Freq: Every day | ORAL | Status: DC
  Administered 2013-05-19: 12:00:00 100 mg via ORAL
  Filled 2013-05-19: qty 1

## 2013-05-19 MED ORDER — METOPROLOL SUCCINATE ER 50 MG PO TB24: 150.0000 mg | ORAL_TABLET | Freq: Every day | ORAL | Status: DC

## 2013-05-19 MED ORDER — RIVAROXABAN 15 MG PO TABS
15.0000 mg | ORAL_TABLET | Freq: Every day | ORAL | Status: DC
  Administered 2013-05-19: 15 mg via ORAL
  Filled 2013-05-19: qty 1

## 2013-05-19 MED ORDER — DIGOXIN 125 MCG PO TABS: 0.1250 mg | ORAL_TABLET | Freq: Every day | ORAL | Status: DC

## 2013-05-19 NOTE — Progress Notes (Signed)
Nutrition Brief Note  Patient identified on the Malnutrition Screening Tool (MST) Report  Wt Readings from Last 15 Encounters:  05/19/13 165 lb 2 oz (74.9 kg)  05/02/13 184 lb 6.4 oz (83.643 kg)  03/16/13 182 lb 6.4 oz (82.736 kg)  12/13/12 165 lb (74.844 kg)  11/29/12 166 lb (75.297 kg)  11/02/12 168 lb 8 oz (76.431 kg)  10/11/12 183 lb 4.8 oz (83.144 kg)  05/27/12 170 lb (77.111 kg)  04/26/12 178 lb 1.6 oz (80.786 kg)  03/12/12 177 lb 9.6 oz (80.559 kg)  03/04/12 199 lb 12.8 oz (90.629 kg)  02/26/12 196 lb 6.4 oz (89.086 kg)  02/10/12 180 lb 12.8 oz (82.01 kg)  01/27/12 182 lb 3.2 oz (82.645 kg)  01/21/12 182 lb 3.2 oz (82.645 kg)    Body mass index is 28.33 kg/(m^2). Patient meets criteria for Overweight based on current BMI.   Current diet order is Heart healthy/ Carb Modified, patient is consuming approximately 65-100% of meals at this time. Pt states she had a poor appetite 3-4 days PTA and was not eating much but, her appetite is good now and she is eating well. Weight loss likely related to fluid. Pt reports following a low-sal diet at home. Provided "Sodium Free Flavoring Tips" and "Heart Healthy Shopping Tips" to aid pt's food choices.  Labs and medications reviewed.   No nutrition interventions warranted at this time. If nutrition issues arise, please consult RD.   Ian Malkin RD, LDN Inpatient Clinical Dietitian Pager: [phone] After Hours Pager: [phone]

## 2013-05-19 NOTE — Progress Notes (Signed)
Daughter and another son came to visit and were concerned about patient needing a home health nurse for medication compliance and with fingersticks for blood sugar. Spoke with Case Manager and stated that patient is able to have that arranged as long as there is a doctor's order. Spoke with Dr. Montez Morita, and stated will put in order for home health nurse. Notified family of what will be arranged.   Heparin drip has been stopped around 5:15pm and Xarelto will be given at this time. Patient is currently eating dinner while family is present. Waiting on another daughter to come pick up patient. Will continue to monitor patient to end of shift.

## 2013-05-19 NOTE — Progress Notes (Signed)
[name] to be D/C'd Home per MD order.  Discussed with the patient and all questions fully answered.    Medication List         albuterol 108 (90 BASE) MCG/ACT inhaler  Commonly known as:  PROVENTIL HFA;VENTOLIN HFA  Inhale 1-2 puffs into the lungs every 6 (six) hours as needed. Shortness of breath     aspirin 81 MG tablet  Take 81 mg by mouth daily.     brimonidine 0.1 % Soln  Commonly known as:  ALPHAGAN P  Place 1 drop into the left eye every 12 (twelve) hours.     collagenase ointment  Commonly known as:  SANTYL  Apply topically daily.     digoxin 0.125 MG tablet  Commonly known as:  LANOXIN  Take 1 tablet (0.125 mg total) by mouth daily.     furosemide 80 MG tablet  Commonly known as:  LASIX  Take 80 mg by mouth 2 (two) times daily.     lisinopril 20 MG tablet  Commonly known as:  PRINIVIL,ZESTRIL  Take 20 mg by mouth daily.     metFORMIN 1000 MG tablet  Commonly known as:  GLUCOPHAGE  Take 1 tablet (1,000 mg total) by mouth 2 (two) times daily with a meal.     metoprolol succinate 50 MG 24 hr tablet  Commonly known as:  TOPROL-XL  Take 3 tablets (150 mg total) by mouth daily. Take with or immediately following a meal.     potassium chloride 10 MEQ CR capsule  Commonly known as:  MICRO-K  Take 20 mEq by mouth 2 (two) times daily.     pravastatin 40 MG tablet  Commonly known as:  PRAVACHOL  Take 1 tablet (40 mg total) by mouth every evening.     Rivaroxaban 15 MG Tabs tablet  Commonly known as:  XARELTO  Take 1 tablet (15 mg total) by mouth daily with supper.     TRUSOPT 2 % ophthalmic solution  Generic drug:  dorzolamide  Place 1 drop into the left eye 2 (two) times daily.        VVS, Skin clean, dry and intact without evidence of skin break down, no evidence of skin tears noted. IV catheter discontinued intact. Site without signs and symptoms of complications. Dressing and pressure applied.  An After Visit Summary was printed and given to the  patient. Patient escorted via WC, and D/C home via private auto.  [name] 05/19/2013 4:42 PM

## 2013-05-19 NOTE — Progress Notes (Addendum)
ANTICOAGULATION CONSULT NOTE - Initial Consult  Pharmacy Consult for Heparin Indication: atrial fibrillation  No Known Allergies  Patient Measurements: Height: 5\' 4"  (162.6 cm) Weight: 165 lb 2 oz (74.9 kg) (Scale B) IBW/kg (Calculated) : 54.7  Vital Signs: Temp: 98.2 F (36.8 C) (10/02 0433) Temp src: Oral (10/02 0433) BP: 124/83 mmHg (10/02 0433) Pulse Rate: 138 (10/02 0910)  Labs:  Recent Labs  05/17/13 1954 05/18/13 0220 05/18/13 0941 05/18/13 1427 05/19/13 0630 05/19/13 0854  HGB 12.6  --  13.3  --  12.7  --   HCT 37.1  --  40.5  --  40.0  --   PLT 282  --  311  --  282  --   HEPARINUNFRC  --   --   --   --  <0.10*  --   CREATININE 0.76  --  0.75  --   --  0.65  TROPONINI  --  0.37* 0.30* 0.35*  --   --     Estimated Creatinine Clearance: 51 ml/min (by C-G formula based on Cr of 0.65).   Medical History: Past Medical History  Diagnosis Date  . Hypertension   . Diabetes mellitus   . Asthma   . Osteoarthritis     left shoulder  . Pseudogout   . Hypokalemia     a. Previously felt due to diuretics, req supplementation.  . Churg-Strauss syndrome     a. Sural nerve biopsy 02/2000.  Darene Lamer disease     a. s/p thyroidectomy.  . Glaucoma   . Hypercholesteremia   . Venous insufficiency   . Osteopenia     DEXA 2004  . Lipoma     left inner thigh  . Cataract     left eye  . VASCULITIS   . Chronic combined systolic and diastolic heart failure     a. EF 25-35% 2001 req diuresis, thoracentesis. b. EF improved to 50-55% by echo 2003. c. EF 20-25% by echo 02/2012 with mild AI, mod MR/TR, PAP , sm-mod pericardial effusion --> subsequent abnormal Myoview. Treated medically. ?Tachy-mediated.  . Pleural effusion, left     a. Thoracentesis 2001 - per notes, no malignant cells, was transudative.  Marland Kitchen NSVT (nonsustained ventricular tachycardia)     a. During CHF adm 2001.  . Multifocal atrial tachycardia     a. Documented as OP 01/2012.  Marland Kitchen Ectopic atrial  tachycardia     a. Documented on tele as IP 02/2012.  . Pulmonary HTN     a. Mod by echo 02/2012.  . Valvular heart disease     a. Echo 02/2012: mod MR/TR, mild AI.  Marland Kitchen Pericardial effusion     a. Echo 02/2012: small-mod pericardial effusion.    Assessment: 79 year old admitted with Afib, on IV heparin, with subtherapeutic heparin level this morning. Per RN, no interruptions with infusion last night. Hgb/plts are stable, No bleeding noted per chart.   Goal of Therapy:  Heparin level 0.3-0.7 units/ml Monitor platelets by anticoagulation protocol: Yes   Plan:  - Increase heparin rate to 1250 units/hr - f/u 8 hr anti-Xa level at 1700 - Daily heparin level, CBC - F/u plans for oral anticoagulations.   Thank you. Bayard Hugger, PharmD, BCPS  Clinical Pharmacist  Pager: [phone]   05/19/2013,9:56 AM  Addendum: Care management was consulted and she will be able to afford Xarelto. Pharmacy is consulted to switch IV heparin to xarelto. Her LFTs were wnl in August. Scr 0.65 today, est. crcl ~  50 which is borderline for 20 mg daily, but given her old age, severe heart failure, which could put her at risk for renal function change, will start her on 15 mg daily with evening meal. Medication reviewed, no significant drug interaction with xarelto.  Plan: - Start xarelto 15 mg daily, first dose today at 1700 - d/c IV heparin right before xarelto is given. - monitor renal function and CBC  Bayard Hugger, PharmD, BCPS  Clinical Pharmacist  Pager: [phone]

## 2013-05-19 NOTE — Progress Notes (Signed)
Have discussed with student. Have seen and agree with this note. Hulen Luster, PharmD-Professor, [employer] [employer].

## 2013-05-19 NOTE — Progress Notes (Addendum)
Subjective: Patient seen at the bedside. She feels very good this morning. She denies chest pain, shortness of breath, orthopnea, PND, worsening leg swelling. She says she feels back to normal. She walked with PT and says it went well, denies fatigue (though the PT herself said the patient was "worn out").  We talked about her living situation. She lives alone, but tells me she's "not really alone" because her children check in on her all the time, every 1-2 hours. Her son Leonette Most usually picks up her medications for her and helps her put them in a pill box. His # is [id number]. There was a miscommunication last week among her kids, which is why she did not get her medications including Lasix.  Care management stopped by yesterday to talk to her about [facility]. Patient will receive a post discharge transition of care call and will be evaluated for monthly home visits for assessments and CHF disease process education. I think this will be a very positive thing for her moving forward, especially if she returns to living alone.  I spoke with care management about the cost of rivaroxaban for her. It would be covered by her insurance with a co-pay of $3.   Objective: Vital signs in last 24 hours: Filed Vitals:   05/18/13 2010 05/18/13 2100 05/19/13 0150 05/19/13 0433  BP: 144/111 135/99 132/84 124/83  Pulse: 116 109 102 107  Temp: 97.6 F (36.4 C)  97.3 F (36.3 C) 98.2 F (36.8 C)  TempSrc: Oral  Oral Oral  Resp: 19  20 20   Height:      Weight:    165 lb 2 oz (74.9 kg)  SpO2: 96%  95% 100%   Weight change: -10 lb 2.8 oz (-4.616 kg)  Intake/Output Summary (Last 24 hours) at 05/19/13 0726 Last data filed at 05/19/13 0100  Gross per 24 hour  Intake    840 ml  Output   3650 ml  Net  -2810 ml   Physical Exam:  General: alert, cooperative, and in no apparent distress HEENT: NCAT pupils equal round and reactive to light, vision grossly intact, oropharynx clear and non-erythematous  Neck:  supple, no lymphadenopathy, distended EJV but not elevated >3cm Lungs: CTAP, normal work of respiration, no rhonchi Heart: irregularly irregular rate. No gallops or rubs. Diffuse PMI. Abdomen: soft, non-tender, non-distended, normal bowel sounds  Extremities: 1+ pitting edema to mid-shins of right lower extremity, 2+ of left lower extremity, no pain to palpation of bilateral calves; wound now with gauze dressing; warm well perfused, no cyanosis, clubbing Neurologic: alert & oriented X3, cranial nerves II-XII intact, strength grossly intact, sensation intact to light touch   Lab Results: Basic Metabolic Panel:  Recent Labs Lab 05/17/13 1954 05/18/13 0941  NA 138 140  K 4.0 3.5  CL 103 101  CO2 21 29  GLUCOSE 121* 163*  BUN 23 19  CREATININE 0.76 0.75  CALCIUM 8.7 8.8   CBC:  Recent Labs Lab 05/18/13 0941 05/19/13 0630  WBC 3.2* 3.6*  NEUTROABS 1.3*  --   HGB 13.3 12.7  HCT 40.5 40.0  MCV 90.2 91.1  PLT 311 282   Cardiac Enzymes:  Recent Labs Lab 05/18/13 0220 05/18/13 0941 05/18/13 1427  TROPONINI 0.37* 0.30* 0.35*   BNP:  Recent Labs Lab 05/17/13 1955  PROBNP 4185.0*   CBG:  Recent Labs Lab 05/18/13 0246 05/18/13 0631 05/18/13 1059 05/18/13 1608 05/18/13 2111 05/19/13 0615  GLUCAP 128* 113* 168* 150* 133* 150*  Urinalysis:  Recent Labs Lab 05/18/13 0016  COLORURINE YELLOW  LABSPEC 1.007  PHURINE 6.0  GLUCOSEU NEGATIVE  HGBUR NEGATIVE  BILIRUBINUR NEGATIVE  KETONESUR NEGATIVE  PROTEINUR NEGATIVE  UROBILINOGEN 0.2  NITRITE NEGATIVE  LEUKOCYTESUR NEGATIVE    Studies/Results: Dg Chest 2 View  05/17/2013   CLINICAL DATA:  Shortness of breath and chest pain.  EXAM: CHEST  2 VIEW  COMPARISON:  04/17/2013.  FINDINGS: The heart is enlarged but stable. There is tortuosity, ectasia and calcification of the thoracic aorta. Prominent mediastinal and hilar contours are unchanged. There is central vascular congestion but no overt pulmonary edema.  No pleural effusions. The bony thorax is intact.  IMPRESSION: Stable cardiac enlargement and moderate central vascular congestion without overt pulmonary edema.   Electronically Signed   By: Loralie Champagne M.D.   On: 05/17/2013 19:49   Medications: I have reviewed the patient's current medications. Scheduled Meds: . aspirin  81 mg Oral Daily  . collagenase   Topical Daily  . digoxin  0.125 mg Oral Daily  . dorzolamide  1 drop Left Eye BID  . furosemide  60 mg Intravenous BID  . insulin aspart  0-15 Units Subcutaneous TID WC  . lisinopril  20 mg Oral Daily  . metFORMIN  1,000 mg Oral BID WC  . metoprolol succinate  50 mg Oral Daily  . potassium chloride  20 mEq Oral BID  . simvastatin  20 mg Oral q1800  . sodium chloride  3 mL Intravenous Q12H   Continuous Infusions: . heparin 1,050 Units/hr (05/18/13 1820)   PRN Meds:.albuterol  Assessment/Plan: #Acute on chronic heart failure (combined systolic and diastolic) - Due to new-onset atrial fibrillation and noncompliance with her Lasix. Patient euvolemic this morning s/p IV Lasix 60mg  x3. She has no orthopnea, no JVD, no SOB, no suggestion of pulmonary edema/crackles on lung exam. Her weight is down from 175 on admission to 165 this morning. She is -6.2L since admission. - Appreciate cardiology recommendations - Continue ACE inhibitor, statin, ASA, BB - Will transition her back to her home diuretic regimen, Lasix 80mg  po BID - D/c foley - Continue daily weights - Continue home K-Dur - PT recommends home health PT with close supervision for initial few days - Will follow up OT recs - I will call son Leonette Most to discuss her outpatient management  #Atrial fibrillation - TSH wnl at 1.505. CHADS vasc score 6 (moderate to high). Her left atrium on 2013 echo was enlarged at 5.9cm, so per cardiology it is highly unlikely she will be able to maintain SR if cardioverted. We will therefore manage her medically with metoprolol and digoxin.  Cardiology does feel that she needs anticoagulation and perhaps would be a good candidate for NOACs. Our pharmacy student did some research and suggested initiating her on rivaroxaban 15 mg by mouth daily with food, based on its once daily dosing and increased safety profile in elderly patients. Fortunately she has insurance that will cover this med.  - Increase metoprolol to 100mg  daily for better rate control (rate 120-130s when up and working with PT) - Continue digoxin 0.125mg  daily, this will require some outpatient followup - Pharmacy consult for bridge from heparin gtt to rivaroxaban  #Elevated troponins - 0.37>0.30>0.35. Most likely reflect demand ischemia, not MI. Patient denies chest pain or other anginal symptoms at this time. - Continue to manage medically  #Right pretibial wound - Stable, no obvious drainage, no erythema. Pt did not take the doxycycline she was prescribed by  [facility] earlier this month and did not follow-up with wound care as outpatient.  - Appreciate wound care recommendations - Enzymatic debridement ointment to be applied daily   #DM2- A1C 7.8 on 7/30.14. Takes metformin 1000mg  BID at home. CBGs 150, 133, 150. - CBGs before meals and qhs  - SSI moderate  - Heart healthy carb mod diet   #HTN - Stable, BP 124/83 this am. - Continue lisinopril, metoprolol, lasix  # DVT PPX - Heparin gtt   Dispo: Disposition is deferred at this time, awaiting improvement of current medical problems.  Anticipated discharge in approximately 1-3 day(s).   The patient does have a current PCP Evelena Peat, DO) and does need an [facility] hospital follow-up appointment after discharge.  The patient does not have transportation limitations that hinder transportation to clinic appointments.  .Services Needed at time of discharge: Y = Yes, Blank = No PT:   OT:   RN:   Equipment:   Other:     LOS: 2 days   Vivi Barrack, MD 05/19/2013, 7:26 AM

## 2013-05-19 NOTE — Evaluation (Addendum)
Physical Therapy Evaluation Patient Details Name: [name] MRN: [id number] DOB: 1926-05-23 Today's Date: 05/19/2013 Time: 9629-5284 PT Time Calculation (min): 31 min  PT Assessment / Plan / Recommendation History of Present Illness  Pt adm with CHF.  Clinical Impression  Pt admitted with above. Pt currently with functional limitations due to the deficits listed below (see PT Problem List).  Pt will benefit from skilled PT to increase their independence and safety with mobility to allow discharge to the venue listed below. Pt initially will need incr support from her family.     PT Assessment  Patient needs continued PT services    Follow Up Recommendations  Home health PT;Supervision/Assistance - 24 hour (for initial few days)    Does the patient have the potential to tolerate intense rehabilitation      Barriers to Discharge        Equipment Recommendations  None recommended by PT    Recommendations for Other Services     Frequency Min 3X/week    Precautions / Restrictions Precautions Precautions: Fall Restrictions Weight Bearing Restrictions: No   Pertinent Vitals/Pain HR mid 130's with amb.      Mobility  Bed Mobility Bed Mobility: Supine to Sit;Sitting - Scoot to Edge of Bed Supine to Sit: 6: Modified independent (Device/Increase time);HOB elevated;With rails Sitting - Scoot to Edge of Bed: 6: Modified independent (Device/Increase time) Details for Bed Mobility Assistance: Incr time Transfers Transfers: Sit to Stand;Stand to Sit Sit to Stand: 4: Min assist;4: Min guard;With upper extremity assist;From bed;From toilet Stand to Sit: 4: Min assist;With upper extremity assist;With armrests;To chair/3-in-1;To toilet Details for Transfer Assistance: Verbal cues for hand placement. Ambulation/Gait Ambulation/Gait Assistance: 4: Min assist Ambulation Distance (Feet): 90 Feet Assistive device: Rolling walker Ambulation/Gait Assistance Details: Verbal cues to stay  closer to walker. Gait Pattern: Step-through pattern;Decreased step length - right;Decreased step length - left;Shuffle;Trunk flexed Gait velocity: decr    Exercises     PT Diagnosis: Difficulty walking;Generalized weakness  PT Problem List: Decreased strength;Decreased activity tolerance;Decreased balance;Decreased mobility;Decreased knowledge of use of DME PT Treatment Interventions: DME instruction;Gait training;Functional mobility training;Therapeutic activities;Therapeutic exercise;Balance training;Patient/family education     PT Goals(Current goals can be found in the care plan section) Acute Rehab PT Goals Patient Stated Goal: Go home PT Goal Formulation: With patient Time For Goal Achievement: 05/26/13 Potential to Achieve Goals: Good  Visit Information  Last PT Received On: 05/19/13 Assistance Needed: +1 History of Present Illness: Pt adm with CHF.       Prior Functioning  Home Living Family/patient expects to be discharged to:: Private residence Living Arrangements: Alone Available Help at Discharge:  (pt reports family can be there as much as needed.) Type of Home: House Home Access: Stairs to enter [employer] of Steps: 2 Entrance Stairs-Rails: None Home Layout: One level Home Equipment: Walker - 2 wheels;Cane - single point;Toilet riser;Bedside commode Prior Function Level of Independence: Independent with assistive device(s) Comments: amb with rolling walker Communication Communication: No difficulties    Cognition  Cognition Arousal/Alertness: Awake/alert Behavior During Therapy: WFL for tasks assessed/performed Overall Cognitive Status: Within Functional Limits for tasks assessed    Extremity/Trunk Assessment Upper Extremity Assessment Upper Extremity Assessment: Defer to OT evaluation Lower Extremity Assessment Lower Extremity Assessment: Generalized weakness   Balance Balance Balance Assessed: Yes Static Sitting Balance Static  Sitting - Balance Support: Bilateral upper extremity supported Static Sitting - Level of Assistance: 6: Modified independent (Device/Increase time) Static Standing Balance Static Standing - Balance Support: Bilateral  upper extremity supported;During functional activity Static Standing - Level of Assistance: 5: Stand by assistance  End of Session PT - End of Session Equipment Utilized During Treatment: Gait belt Activity Tolerance: Patient limited by fatigue Patient left: in chair Nurse Communication: Mobility status  GP     Nestor Wieneke 05/19/2013, 9:30 AM  [facility] PT [phone]

## 2013-05-19 NOTE — Progress Notes (Signed)
Dressing change performed to the right leg. Removed old dressing. Cleaned wound with normal saline for small to moderate amount of yellow drainage with slight odor. Applied Santynl ointment and applied wet to dry gauze then wrapped with kerlex dressing. Patient complained of no pain or discomfort prior to, during or after dressing change. Will continue to monitor to end of shift.

## 2013-05-19 NOTE — Progress Notes (Signed)
Patient Name: [name] Date of Encounter: 05/19/2013  Principal Problem:   Acute on chronic combined systolic and diastolic congestive heart failure Active Problems:   DIABETES MELLITUS, TYPE II   HYPERTENSION   CARDIOMYOPATHY, DILATED   Wound of right leg   Atrial fibrillation   Elevated troponin   Asthma   Valvular heart disease   Churg-Strauss syndrome   Pericardial effusion   SUBJECTIVE  The patient is feeling well, no CP or SOB.  CURRENT MEDS . aspirin  81 mg Oral Daily  . collagenase   Topical Daily  . digoxin  0.125 mg Oral Daily  . dorzolamide  1 drop Left Eye BID  . furosemide  80 mg Oral BID  . insulin aspart  0-15 Units Subcutaneous TID WC  . lisinopril  20 mg Oral Daily  . metoprolol succinate  100 mg Oral Daily  . potassium chloride  20 mEq Oral BID  . simvastatin  20 mg Oral q1800  . sodium chloride  3 mL Intravenous Q12H    OBJECTIVE  Filed Vitals:   05/18/13 2100 05/19/13 0150 05/19/13 0433 05/19/13 0910  BP: 135/99 132/84 124/83   Pulse: 109 102 107 138  Temp:  97.3 F (36.3 C) 98.2 F (36.8 C)   TempSrc:  Oral Oral   Resp:  20 20   Height:      Weight:   165 lb 2 oz (74.9 kg)   SpO2:  95% 100%     Intake/Output Summary (Last 24 hours) at 05/19/13 1129 Last data filed at 05/19/13 0931  Gross per 24 hour  Intake    840 ml  Output   3150 ml  Net  -2310 ml   Filed Weights   05/17/13 2225 05/18/13 0152 05/19/13 0433  Weight: 175 lb 4.8 oz (79.516 kg) 171 lb 8.3 oz (77.8 kg) 165 lb 2 oz (74.9 kg)    PHYSICAL EXAM  General: Pleasant, NAD. Neuro: Alert and oriented X 3. Moves all extremities spontaneously. Psych: Normal affect. HEENT:  Normal  Neck: Supple without bruits or JVD. Lungs:  Resp regular and unlabored, CTA. Heart: RRR no s3, s4, or murmurs. Abdomen: Soft, non-tender, non-distended, BS + x 4.  Extremities: No clubbing, cyanosis or edema. DP/PT/Radials 2+ and equal bilaterally.  Accessory Clinical  Findings  CBC  Recent Labs  05/18/13 0941 05/19/13 0630  WBC 3.2* 3.6*  NEUTROABS 1.3*  --   HGB 13.3 12.7  HCT 40.5 40.0  MCV 90.2 91.1  PLT 311 282   Basic Metabolic Panel  Recent Labs  05/18/13 0941 05/19/13 0854  NA 140 134*  K 3.5 4.3  CL 101 97  CO2 29 27  GLUCOSE 163* 166*  BUN 19 14  CREATININE 0.75 0.65  CALCIUM 8.8 8.3*   Cardiac Enzymes  Recent Labs  05/18/13 0220 05/18/13 0941 05/18/13 1427  TROPONINI 0.37* 0.30* 0.35*   Thyroid Function Tests  Recent Labs  05/18/13 0941  TSH 1.505    TELE  A fib with HR 90-105  Radiology/Studies  Dg Chest 2 View  05/17/2013   CLINICAL DATA:  Shortness of breath and chest pain.  EXAM: CHEST  2 VIEW  COMPARISON:  04/17/2013.  FINDINGS: The heart is enlarged but stable. There is tortuosity, ectasia and calcification of the thoracic aorta. Prominent mediastinal and hilar contours are unchanged. There is central vascular congestion but no overt pulmonary edema. No pleural effusions. The bony thorax is intact.  IMPRESSION: Stable cardiac enlargement and moderate  central vascular congestion without overt pulmonary edema.   Electronically Signed   By: Loralie Champagne M.D.   On: 05/17/2013 19:49    ASSESSMENT AND PLAN  1. Acute on chronic combined systolic and diastolic CHF, exacerbation likely due to combination of afib & being out of Lasix for 2 days  2. Newly recognized atrial fibrillation with RVR (CHADS2VASC=6)  3. Elevated troponin this admission with history of abnormal myoview 02/2012, likely reflecting demand ischemia instead of actual MI  4. HTN  5. Diabetes mellitus type II  6. Asthma  7. H/o valvular heart disease (mod MR/TR, mild AI by echo 02/2012)  8. H/o sm-mod pericardial effusion by echo 02/2012  9. H/o Churg-strauss syndrome  10. H/o multifocal atrial tachycardia & ectopic atrial tachycardia   Mrs Albino was admitted with acute CHF, most likely as a combination of a-fib with RVR and medication  non-compliance in the last few days prior to the admission.   1. CHF - The patient responded well to iv Lasix. Almost euvolemic, we will continue with low dose Lasix.   2. Atrial fibrillation with RVR - borderline rate controlled - we will uptitrate BB,  it is of unknown duration, her LA on the 2013 echo measures 5.9 cm and its highly unlikely she will be able to maintain SR if cardioverted. Therefore we will prefer rate control with Metoprolol and Digoxin.  Because her CHADS2-VASc score is 6 we will start her on anticoagulation with Heparin drip. Care management was consulted and she will be able to afford Xarelto.  Plan for discharge tomorrow.   3. Minimal troponin elevation, now downtrending, most probably in the settings of afib with RVR, prior pharmacologic nuclear stress test in 2013 showed small scar in the basal to mid inferolateral perfusion defect that could represent prior infarction with minimal peri-infarct ischemia. This was out of proportion to the degree of the LV dysfunction so her etiology is most probably combination of ischemic and non-ischemic. We will continue medical therapy for now.   Signed, Lars Masson MD

## 2013-05-19 NOTE — Progress Notes (Addendum)
Spoke with Latanya Maudlin, the patient's son. He confirmed he is the son who coordinates her medicines. We talked about her new medicines, digoxin and rivaroxaban, what they do, how they should be taken (with dinner since biggest meal), and how important it is that she take them as instructed and every day. He confirmed understanding. He appreciated that we had ordered home health PT for his mom. He understands that she will need to be watched very closely in the first few days after his discharge.   Vivi Barrack, MD  [email or web address] Pager # [phone] Office # [phone]

## 2013-05-19 NOTE — Discharge Summary (Signed)
Name: [name] MRN: [id number] DOB: 08-08-1926 79 y.o. PCP: Evelena Peat, DO  Date of Admission: 05/17/2013  6:45 PM Date of Discharge: 05/19/2013 Attending Physician: Cliffton Asters, MD  Discharge Diagnosis:  1. Acute on chronic heart failure (combined systolic and diastolic) 2. Newly discovered atrial fibrillation 3. Mildly elevated troponins 4. Right pretibial wound 5. DM2 6. HTN  Discharge Medications:   Medication List    ASK your doctor about these medications       albuterol 108 (90 BASE) MCG/ACT inhaler  Commonly known as:  PROVENTIL HFA;VENTOLIN HFA  Inhale 1-2 puffs into the lungs every 6 (six) hours as needed. Shortness of breath     aspirin 81 MG tablet  Take 81 mg by mouth daily.     brimonidine 0.1 % Soln  Commonly known as:  ALPHAGAN P  Place 1 drop into the left eye every 12 (twelve) hours.     furosemide 80 MG tablet  Commonly known as:  LASIX  Take 80 mg by mouth 2 (two) times daily.     lisinopril 20 MG tablet  Commonly known as:  PRINIVIL,ZESTRIL  Take 20 mg by mouth daily.     metFORMIN 1000 MG tablet  Commonly known as:  GLUCOPHAGE  Take 1 tablet (1,000 mg total) by mouth 2 (two) times daily with a meal.     metoprolol succinate 100 MG 24 hr tablet  Commonly known as:  TOPROL-XL  Take 100 mg by mouth daily. Take with or immediately following a meal.     potassium chloride 10 MEQ CR capsule  Commonly known as:  MICRO-K  Take 20 mEq by mouth 2 (two) times daily.     pravastatin 40 MG tablet  Commonly known as:  PRAVACHOL  Take 1 tablet (40 mg total) by mouth every evening.     TRUSOPT 2 % ophthalmic solution  Generic drug:  dorzolamide  Place 1 drop into the left eye 2 (two) times daily.        Disposition and follow-up:   WendyEmaya Lowery was discharged from [facility] in stable condition.  At the hospital follow up visit please address:  1.  Can we please get her set up with Dr. Alexandria Lodge in the anticoagulation  clinic? What is her volume status? Has she had any trouble taking her mediations?   2.  Labs / imaging needed at time of follow-up: Digoxin level, HgbA1C  3.  Pending labs/ test needing follow-up: None  Follow-up Appointments:     Follow-up Information   Follow up with Dow Adolph, MD On 05/26/2013. (Internal medicine clinic ground floor, 1:15 pm)    Specialty:  Internal Medicine   Contact information:   [address] [place] [address] [phone]       Follow up with [facility] On 06/01/2013. (with Herma Carson, PA at 12 pm)    Contact information:   [address] [place] [address]       Discharge Instructions:  Future Appointments Provider Department Dept Phone   05/20/2013 9:00 AM [facility] [phone]   05/26/2013 1:15 PM Dow Adolph, MD [facility] [phone]   06/01/2013 12:00 PM Dyann Kief, PA-C [facility] [place] Office [phone]      Consultations: Treatment Team:  Rounding Lbcardiology, MD  Procedures Performed:  Dg Chest 2 View  05/17/2013   CLINICAL DATA:  Shortness of  breath and chest pain.  EXAM: CHEST  2 VIEW  COMPARISON:  04/17/2013.  FINDINGS: The heart is enlarged but stable. There is tortuosity, ectasia and calcification of the thoracic aorta. Prominent mediastinal and hilar contours are unchanged. There is central vascular congestion but no overt pulmonary edema. No pleural effusions. The bony thorax is intact.  IMPRESSION: Stable cardiac enlargement and moderate central vascular congestion without overt pulmonary edema.   Electronically Signed   By: Loralie Champagne M.D.   On: 05/17/2013 19:49    Admission HPI:  [name] is an 79 year old woman with history of DM2 (A1C 7.8 in 02/2013), HTN, dilated cardiomyopathy, Grave's disease (s/p thyroidectomy), glaucoma who presents to the ED with increased shortness of breath x 1-2  days as well as bilateral lower extremity edema. Patient is a poor historian. States that SOB worse with exertion (she walks with walker at home) and she has also had orthopnea; unclear if she has had any weight gain. She takes Lasix 80 mg BID and reports good medication compliance. She has been hospitalized twice in past for heart failure exacerbations. Patient lives alone but has one of her six daughters with her around the clock. She is not on home O2.   Of note, she was seen in [facility] on 05/02/13 for non-healing right lower extremity wound. At that appointment, wound was pruritic with yellow discharge and some surround erythema but no pain, no fevers. Patient was prescribed doxycycline 100 mg PO BID x 7 days which she says she never picked up; she was also referred to wound care for likely debridement but she does not think she ever saw wound care.   Denies chest pain, cough, lightheadedness, fever/chills, abdominal pain, changes in bowel/bladder habits.   In the ED, patient was placed on 2L O2 andreceived Lasix 60 mg IV. She says her SOB is much improved and her leg swelling is already visibly reduced with these treatments. She was urinating frequently so nurse placed Foley catheter which will also be helpful in measuring Is and Os.    Hospital Course by problem list:  1. Acute on chronic heart failure (combined systolic and diastolic) - Pt presenting with SOB and bilateral lower extremity edema x 1-2 days, + orthopnea. She takes Lasix 80 mg BID at home, but after further questioning had been noncompliant x2 days when there was a miscommunication between her children about who would pick up her medicines at the pharmacy. She also was found to have new-onset atrial fibrillation, discussed below, which likely contributed to her current exacerbation. On exam, mild bibasilar crackles appreciated as well as 1+ pitting edema of right lower extremity and 2+ pitting edema of left lower extremity (no erythema or  tenderness to palpation of bilateral calves). CXR showed stable cardiac enlargement with moderate central vascular congestion without overt pulmonary edema; pBNP 4185, increased from 3490 on 04/17/13. Echo in 02/2012 showed EF 20-25% which was reduced from 2003 echo, which showed EF of 50-55% and abnormal left ventricular relaxation. Lexiscan Myoview performed on 03/11/12 showed EF 17% with dilated LV and global hypokinesis.   Patient was euvolemic on the day of discharge s/p IV Lasix 60mg  x3 and rate control of her AFib as below. She denied orthopnea, no JVD, no SOB, no suggestion of pulmonary edema/crackles on lung exam. Her weight trended down from 175 on admission to 165, -6.2L. We continued her ACE inhibitor, statin, ASA, BB and transitioned her back to her home diuretic regimen, Lasix 80mg  po BID.  Home health PT was arranged. I spoke with her son, Whisper Kurka, who does most of her medication management, to encourage medication compliance and communication between the many children who take care of her. We also set her up with [facility] follow up.  2. Newly discovered atrial fibrillation - Rate 90-100s initially, increasing to 120-130s with exertion when she worked with PT. TSH wnl at 1.505. CHADS vasc score 6 (moderate to high). Her left atrium on 2013 echo was enlarged at 5.9cm, so per cardiology it is highly unlikely she will be able to maintain SR if cardioverted. We therefore decided to manage her medically with metoprolol and digoxin. Cardiology also did feel that she needed anticoagulation and would be a good candidate for NOACs. Our pharmacy colleagues suggested initiating her on rivaroxaban 15 mg by mouth daily with food, based on its once daily dosing and increased safety profile in elderly patients. Fortunately she has insurance that will cover this med. We therefore made the following medication changes: increase metoprolol dose to 100mg  daily for better rate control, add digoxin 0.125mg  daily, add  Xarelto 15mg  daily. Patient was on a heparin gtt until night of discharge, when it was discontinued and she was given her first Xarelto with dinner. Patient and family were given literature on digoxin and xarelto. The importance of compliance was emphasized.  3. Mildly elevated troponins - 0.37>0.30>0.35. Per cardiology most likely reflect demand ischemia, not MI. Patient denied chest pain or other anginal symptoms. We decided to manage medically, but she may warrant further work up if she develops overt chest pain in the future.  4. Right pretibial wound - Stable, no obvious drainage, no erythema. Pt did not take the doxycycline she was prescribed by [facility] earlier this month, and did not follow-up with wound care as an outpatient. Wound care saw her here and recommended enzymatic debridement ointment to be applied daily. She was also scheduled for an outpatient wound care appointment the day after discharge.  5. DM2 - A1C 7.8 on 03/16/13. Takes metformin 1000mg  BID at home. Good control on SSI moderate scale, CBGs 150, 133, 150 here. Recommend outpatient follow up.  6. HTN - Stable on lisinopril, metoprolol, lasix  Discharge Vitals:   BP 115/76  Pulse 103  Temp(Src) 98.2 F (36.8 C) (Oral)  Resp 20  Ht 5\' 4"  (1.626 m)  Wt 165 lb 2 oz (74.9 kg)  BMI 28.33 kg/m2  SpO2 100%  Discharge Labs:  Results for orders placed during the hospital encounter of 05/17/13 (from the past 24 hour(s))  TROPONIN I     Status: Abnormal   Collection Time    05/18/13  2:27 PM      Result Value Range   Troponin I 0.35 (*) <0.30 ng/mL  GLUCOSE, CAPILLARY     Status: Abnormal   Collection Time    05/18/13  4:08 PM      Result Value Range   Glucose-Capillary 150 (*) 70 - 99 mg/dL  GLUCOSE, CAPILLARY     Status: Abnormal   Collection Time    05/18/13  9:11 PM      Result Value Range   Glucose-Capillary 133 (*) 70 - 99 mg/dL  GLUCOSE, CAPILLARY     Status: Abnormal   Collection Time    05/19/13  6:15 AM       Result Value Range   Glucose-Capillary 150 (*) 70 - 99 mg/dL  HEPARIN LEVEL (UNFRACTIONATED)     Status: Abnormal   Collection Time  05/19/13  6:30 AM      Result Value Range   Heparin Unfractionated <0.10 (*) 0.30 - 0.70 IU/mL  CBC     Status: Abnormal   Collection Time    05/19/13  6:30 AM      Result Value Range   WBC 3.6 (*) 4.0 - 10.5 K/uL   RBC 4.39  3.87 - 5.11 MIL/uL   Hemoglobin 12.7  12.0 - 15.0 g/dL   HCT 16.1  09.6 - 04.5 %   MCV 91.1  78.0 - 100.0 fL   MCH 28.9  26.0 - 34.0 pg   MCHC 31.8  30.0 - 36.0 g/dL   RDW 40.9  81.1 - 91.4 %   Platelets 282  150 - 400 K/uL  BASIC METABOLIC PANEL     Status: Abnormal   Collection Time    05/19/13  8:54 AM      Result Value Range   Sodium 134 (*) 135 - 145 mEq/L   Potassium 4.3  3.5 - 5.1 mEq/L   Chloride 97  96 - 112 mEq/L   CO2 27  19 - 32 mEq/L   Glucose, Bld 166 (*) 70 - 99 mg/dL   BUN 14  6 - 23 mg/dL   Creatinine, Ser 7.82  0.50 - 1.10 mg/dL   Calcium 8.3 (*) 8.4 - 10.5 mg/dL   GFR calc non Af Amer 79 (*) >90 mL/min   GFR calc Af Amer >90  >90 mL/min  GLUCOSE, CAPILLARY     Status: Abnormal   Collection Time    05/19/13 11:16 AM      Result Value Range   Glucose-Capillary 154 (*) 70 - 99 mg/dL   Comment 1 Notify RN      Signed: Vivi Barrack, MD 05/19/2013, 1:54 PM   Time Spent on Discharge: 30 minutes Services Ordered on Discharge: Home health PT Equipment Ordered on Discharge: None

## 2013-05-20 ENCOUNTER — Other Ambulatory Visit (HOSPITAL_BASED_OUTPATIENT_CLINIC_OR_DEPARTMENT_OTHER): Payer: Self-pay | Admitting: General Surgery

## 2013-05-20 ENCOUNTER — Ambulatory Visit (HOSPITAL_COMMUNITY)
Admission: RE | Admit: 2013-05-20 | Discharge: 2013-05-20 | Disposition: A | Discharge status: Home / Self Care | Payer: PRIVATE HEALTH INSURANCE | Source: Ambulatory Visit | Attending: General Surgery | Admitting: General Surgery

## 2013-05-20 ENCOUNTER — Other Ambulatory Visit (HOSPITAL_COMMUNITY): Payer: Self-pay | Admitting: General Surgery

## 2013-05-20 ENCOUNTER — Encounter (HOSPITAL_BASED_OUTPATIENT_CLINIC_OR_DEPARTMENT_OTHER): Payer: PRIVATE HEALTH INSURANCE | Attending: General Surgery

## 2013-05-20 DIAGNOSIS — I872 Venous insufficiency (chronic) (peripheral): Secondary | ICD-10-CM

## 2013-05-20 DIAGNOSIS — E119 Type 2 diabetes mellitus without complications: Secondary | ICD-10-CM | POA: Insufficient documentation

## 2013-05-20 DIAGNOSIS — M2340 Loose body in knee, unspecified knee: Secondary | ICD-10-CM | POA: Insufficient documentation

## 2013-05-20 DIAGNOSIS — I517 Cardiomegaly: Secondary | ICD-10-CM | POA: Insufficient documentation

## 2013-05-20 DIAGNOSIS — E1169 Type 2 diabetes mellitus with other specified complication: Secondary | ICD-10-CM | POA: Insufficient documentation

## 2013-05-20 DIAGNOSIS — Z79899 Other long term (current) drug therapy: Secondary | ICD-10-CM | POA: Insufficient documentation

## 2013-05-20 DIAGNOSIS — I70209 Unspecified atherosclerosis of native arteries of extremities, unspecified extremity: Secondary | ICD-10-CM | POA: Insufficient documentation

## 2013-05-20 DIAGNOSIS — S8990XA Unspecified injury of unspecified lower leg, initial encounter: Secondary | ICD-10-CM | POA: Insufficient documentation

## 2013-05-20 DIAGNOSIS — Z96649 Presence of unspecified artificial hip joint: Secondary | ICD-10-CM | POA: Insufficient documentation

## 2013-05-20 DIAGNOSIS — M869 Osteomyelitis, unspecified: Secondary | ICD-10-CM

## 2013-05-20 DIAGNOSIS — M899 Disorder of bone, unspecified: Secondary | ICD-10-CM | POA: Insufficient documentation

## 2013-05-20 DIAGNOSIS — L97809 Non-pressure chronic ulcer of other part of unspecified lower leg with unspecified severity: Secondary | ICD-10-CM | POA: Insufficient documentation

## 2013-05-20 DIAGNOSIS — I10 Essential (primary) hypertension: Secondary | ICD-10-CM | POA: Insufficient documentation

## 2013-05-20 DIAGNOSIS — T148XXA Other injury of unspecified body region, initial encounter: Secondary | ICD-10-CM

## 2013-05-20 DIAGNOSIS — W1809XA Striking against other object with subsequent fall, initial encounter: Secondary | ICD-10-CM | POA: Insufficient documentation

## 2013-05-20 DIAGNOSIS — I739 Peripheral vascular disease, unspecified: Secondary | ICD-10-CM

## 2013-05-20 LAB — GLUCOSE, CAPILLARY: Glucose-Capillary: 121 mg/dL — ABNORMAL HIGH (ref 70–99)

## 2013-05-20 NOTE — Care Management Note (Signed)
    Page 1 of 1   05/20/2013     11:48:01 AM   CARE MANAGEMENT NOTE 05/20/2013  Patient:  [name], [name]   Account Number:  [id number]  Date Initiated:  05/20/2013  Documentation initiated by:  Oletta Cohn  Subjective/Objective Assessment:   79 yo woman with hx of DM2 (A1C 7.8 in 02/2013), HTN, dilated cardiomyopathy, Grave's disease (s/p thyroidectomy), glaucoma who presents to the ED with increased shortness of breath x 1-2 days as well as BLE//HM WITH SPOUSE AND SON     Action/Plan:   Diurese//Home with Home Health   Anticipated DC Date:  05/19/2013   Anticipated DC Plan:  HOME W HOME HEALTH SERVICES      DC Planning Services  CM consult      [facility] Choice  Resumption Of Svcs/PTA [facility]   Choice offered to / List presented to:  C-4 Adult Children        HH arranged  HH-2 PT      HH agency  [facility]   Status of service:  Completed, signed off Medicare Important Message given?   (If response is "NO", the following Medicare IM given date fields will be blank) Date Medicare IM given:   Date Additional Medicare IM given:    Discharge Disposition:    Per UR Regulation:    If discussed at Long Length of Stay Meetings, dates discussed:    Comments:  05/20/13 1030 [name], RN, BSN, [place] [phone] Confirm with Kizzie Furnish of [facility] that pt previously with [facility].  MD orders to resume RN, PT noted.  Spoke with son Pehrson) to verify adress and phone number.

## 2013-05-20 NOTE — H&P (Signed)
[name], [name] NO.:  [id number]  MEDICAL RECORD NO.:  [id number]  LOCATION:  XRAY                         FACILITY:  [facility]  PHYSICIAN:  Joanne Gavel, M.D.        DATE OF BIRTH:  November 29, 1925  DATE OF ADMISSION:  05/20/2013 DATE OF DISCHARGE:                             HISTORY & PHYSICAL   CHIEF COMPLAINT:  Wound, right lower extremity.  HISTORY OF PRESENT ILLNESS:  This is an 79 year old female with diabetes mellitus type 2, who fell and banged her leg approximately 5 weeks ago. The wound has been treated with washings and peroxide.  PAST MEDICAL HISTORY:  The patient has a recent history of hospitalization for congestive heart failure.  She denies ever having a heart attack.  She does have a history of valvular heart disease, pericardial effusion, and atrial fibrillation.  This is thought to be secondary to cardiomyopathy.  Past medical history, hypertension, cardiomyopathy, asthma, Churg- Strauss syndrome.  Cigarettes, none.  Alcohol, none.  PAST SURGICAL HISTORY:  Hip replacement, right side.  MEDICATIONS:  Digoxin, Xarelto, metoprolol, albuterol, brimonidine, furosemide, lisinopril, metformin, pravastatin, and eye drops.  ALLERGIES:  None.  REVIEW OF SYSTEMS:  As above.  PHYSICAL EXAMINATION:  VITAL SIGNS:  Temperature 98.4, pulse 108, with a slight irregularity, respirations 17, blood pressure 111/79, glucose is 121. GENERAL APPEARANCE:  A well developed, well nourished.  She seems awake and alert. HEAD EYES EARS, NOSE, THROAT:  Essentially negative. CHEST:  Clear. HEART:  Now with a slightly irregular rhythm.  I hear no murmurs. ABDOMEN:  Not examined. EXTREMITIES:  Examination of lower extremity reveals peripheral pulses are not palpable.  ABI was not obtainable.  Both lower extremities have some pretibial edema.  On the right anterior leg, there is a 3.4 x 1.9 x 0.2 wound with a sick, mostly adherent slough.  IMPRESSION:  Diabetic  ulcer, right lower extremity, history of trauma, history of congestive heart failure.  PLAN OF TREATMENT:  We will start with Santyl and Hydrogel.  X-ray of the leg, rule out bone involvement.  Vascular studies to rule out peripheral arterial disease.  We will see her in 7 days.     Joanne Gavel, M.D.     RA/MEDQ  D:  05/20/2013  T:  05/20/2013  Job:  [id number]

## 2013-05-23 ENCOUNTER — Telehealth: Payer: Self-pay | Admitting: *Deleted

## 2013-05-23 NOTE — Telephone Encounter (Signed)
Received PA request from pt's pharmacy.  Contacted pt's insurance at [phone] to initiate PA.  Pt has A-Fib (427.31). PA approved though 05/2014.  Will inform pharmacy of approval.[place], Darlene Cassady10/6/20149:21 AM

## 2013-05-25 ENCOUNTER — Ambulatory Visit (HOSPITAL_BASED_OUTPATIENT_CLINIC_OR_DEPARTMENT_OTHER)
Admission: RE | Admit: 2013-05-25 | Discharge: 2013-05-25 | Disposition: A | Discharge status: Home / Self Care | Payer: PRIVATE HEALTH INSURANCE | Source: Ambulatory Visit | Attending: Cardiovascular Disease | Admitting: Cardiovascular Disease

## 2013-05-25 ENCOUNTER — Ambulatory Visit (HOSPITAL_COMMUNITY)
Admission: RE | Admit: 2013-05-25 | Discharge: 2013-05-25 | Disposition: A | Discharge status: Home / Self Care | Payer: PRIVATE HEALTH INSURANCE | Source: Ambulatory Visit | Attending: Cardiovascular Disease | Admitting: Cardiovascular Disease

## 2013-05-25 DIAGNOSIS — I872 Venous insufficiency (chronic) (peripheral): Secondary | ICD-10-CM

## 2013-05-25 DIAGNOSIS — I739 Peripheral vascular disease, unspecified: Secondary | ICD-10-CM

## 2013-05-25 NOTE — Progress Notes (Signed)
Lower Extremity Arterial Duplex Completed. °Brianna L Mazza,RVT °

## 2013-05-25 NOTE — Progress Notes (Signed)
Lower Extremity Venous Duplex Completed. °Brianna L Mazza,RVT °

## 2013-05-26 ENCOUNTER — Ambulatory Visit (INDEPENDENT_AMBULATORY_CARE_PROVIDER_SITE_OTHER): Payer: PRIVATE HEALTH INSURANCE | Admitting: Internal Medicine

## 2013-05-26 ENCOUNTER — Encounter: Payer: Self-pay | Admitting: Internal Medicine

## 2013-05-26 VITALS — BP 137/77 | HR 90 | Temp 97.3°F | Wt 166.8 lb

## 2013-05-26 DIAGNOSIS — I5043 Acute on chronic combined systolic (congestive) and diastolic (congestive) heart failure: Secondary | ICD-10-CM

## 2013-05-26 DIAGNOSIS — I498 Other specified cardiac arrhythmias: Secondary | ICD-10-CM

## 2013-05-26 DIAGNOSIS — I471 Supraventricular tachycardia: Secondary | ICD-10-CM

## 2013-05-26 DIAGNOSIS — I10 Essential (primary) hypertension: Secondary | ICD-10-CM

## 2013-05-26 DIAGNOSIS — E119 Type 2 diabetes mellitus without complications: Secondary | ICD-10-CM

## 2013-05-26 DIAGNOSIS — I4719 Other supraventricular tachycardia: Secondary | ICD-10-CM

## 2013-05-26 DIAGNOSIS — I509 Heart failure, unspecified: Secondary | ICD-10-CM

## 2013-05-26 DIAGNOSIS — IMO0002 Reserved for concepts with insufficient information to code with codable children: Secondary | ICD-10-CM

## 2013-05-26 DIAGNOSIS — I4891 Unspecified atrial fibrillation: Secondary | ICD-10-CM

## 2013-05-26 DIAGNOSIS — S81801S Unspecified open wound, right lower leg, sequela: Secondary | ICD-10-CM

## 2013-05-26 MED ORDER — ACETAMINOPHEN 325 MG PO TABS: 325.0000 mg | ORAL_TABLET | Freq: Three times a day (TID) | ORAL | Status: DC | PRN

## 2013-05-26 MED ORDER — ACETAMINOPHEN 325 MG PO TABS: 650.0000 mg | ORAL_TABLET | Freq: Three times a day (TID) | ORAL | Status: DC | PRN

## 2013-05-26 NOTE — Assessment & Plan Note (Signed)
BP Readings from Last 3 Encounters:  05/26/13 137/77  05/19/13 112/56  05/02/13 139/77    Lab Results  Component Value Date   NA 134* 05/19/2013   K 4.3 05/19/2013   CREATININE 0.65 05/19/2013    Assessment: Blood pressure control:   well controlled  Progress toward BP goal:   at goal  Comments: compliant with medications   Plan: Medications:  continue current medications Educational resources provided:   Self management tools provided:   Other plans: has [facility] and home PT.

## 2013-05-26 NOTE — Assessment & Plan Note (Addendum)
Symptoms back to baseline. Continue with Lasix 80 mg twice a day. Emphasized about limiting fluid intake.   Plan  Continue with digoxin and Xarelto.  Check CMP Check digoxin level Keep appt with cardiology on 06/01/2013.

## 2013-05-26 NOTE — Assessment & Plan Note (Signed)
Follow up with wound center  Tylenol as needed for pain  Cont with home PT and [facility] is involved too

## 2013-05-26 NOTE — Progress Notes (Signed)
Patient ID: [name], female   DOB: 12-23-1925, 79 y.o.   MRN: [id number]   Subjective:   HPI: [name] is a 79 y.o. African American woman, with past medical history of combined CHF, atrial fibrillation, diabetes, right tibial ulcer, presents to the clinic for hospital followup visit. She is accompanied by her son who also manages her medications.  She was discharged on 05/19/2013 for acute CHF exacerbation which was thought to be secondary to noncompliance with Lasix in addition with A fib diagnosed during her hospitalization. Cards was involved in her care and she was started on Digoxin and Xarelto. She was restarted on Lasix 80 mg twice a day. Discharge weight was 165lb. She weighs 166 pounds today. She reports feeling well since returning home except for some abdominal pain, and nausea, which she attributes to her new medications. No history of vomiting, altered mental status. She is also complaining of pain in her right lower extremity, which she attributes to the ulcer. She is following up with wound Center.  Past Medical History  Diagnosis Date  . Hypertension   . Diabetes mellitus   . Asthma   . Osteoarthritis     left shoulder  . Pseudogout   . Hypokalemia     a. Previously felt due to diuretics, req supplementation.  . Churg-Strauss syndrome     a. Sural nerve biopsy 02/2000.  Darene Lamer disease     a. s/p thyroidectomy.  . Glaucoma   . Hypercholesteremia   . Venous insufficiency   . Osteopenia     DEXA 2004  . Lipoma     left inner thigh  . Cataract     left eye  . VASCULITIS   . Chronic combined systolic and diastolic heart failure     a. EF 25-35% 2001 req diuresis, thoracentesis. b. EF improved to 50-55% by echo 2003. c. EF 20-25% by echo 02/2012 with mild AI, mod MR/TR, PAP , sm-mod pericardial effusion --> subsequent abnormal Myoview. Treated medically. ?Tachy-mediated.  . Pleural effusion, left     a. Thoracentesis 2001 - per notes, no malignant  cells, was transudative.  Marland Kitchen NSVT (nonsustained ventricular tachycardia)     a. During CHF adm 2001.  . Multifocal atrial tachycardia     a. Documented as OP 01/2012.  Marland Kitchen Ectopic atrial tachycardia     a. Documented on tele as IP 02/2012.  . Pulmonary HTN     a. Mod by echo 02/2012.  . Valvular heart disease     a. Echo 02/2012: mod MR/TR, mild AI.  Marland Kitchen Pericardial effusion     a. Echo 02/2012: small-mod pericardial effusion.   Current Outpatient Prescriptions  Medication Sig Dispense Refill  . albuterol (PROVENTIL HFA;VENTOLIN HFA) 108 (90 BASE) MCG/ACT inhaler Inhale 1-2 puffs into the lungs every 6 (six) hours as needed. Shortness of breath      . brimonidine (ALPHAGAN P) 0.1 % SOLN Place 1 drop into the left eye every 12 (twelve) hours.  15 mL  1  . collagenase (SANTYL) ointment Apply topically daily.  15 g  0  . digoxin (LANOXIN) 0.125 MG tablet Take 1 tablet (0.125 mg total) by mouth daily.  30 tablet  3  . dorzolamide (TRUSOPT) 2 % ophthalmic solution Place 1 drop into the left eye 2 (two) times daily.       . furosemide (LASIX) 80 MG tablet Take 80 mg by mouth 2 (two) times daily.      Marland Kitchen lisinopril (PRINIVIL,ZESTRIL) 20  MG tablet Take 20 mg by mouth daily.      . metFORMIN (GLUCOPHAGE) 1000 MG tablet Take 1 tablet (1,000 mg total) by mouth 2 (two) times daily with a meal.  60 tablet  2  . metoprolol succinate (TOPROL-XL) 50 MG 24 hr tablet Take 3 tablets (150 mg total) by mouth daily. Take with or immediately following a meal.  90 tablet  3  . pravastatin (PRAVACHOL) 40 MG tablet Take 1 tablet (40 mg total) by mouth every evening.  30 tablet  11  . Rivaroxaban (XARELTO) 15 MG TABS tablet Take 1 tablet (15 mg total) by mouth daily with supper.  30 tablet  11  . acetaminophen (TYLENOL) 325 MG tablet Take 1 tablet (325 mg total) by mouth every 8 (eight) hours as needed for pain. May take an extra pill for pain.  60 tablet  0   No current facility-administered medications for this visit.    Family History  Problem Relation Age of Onset  . Diabetes Mother   . Diabetes Father   . Heart disease Neg Hx    History   Social History  . Marital Status: Widowed    Spouse Name: N/A    Number of Children: N/A  . Years of Education: N/A   Occupational History  . Retired    Social History Main Topics  . Smoking status: Never Smoker   . Smokeless tobacco: Never Used  . Alcohol Use: No  . Drug Use: No  . Sexual Activity: None   Other Topics Concern  . None   Social History Narrative   Lives in house, with family just next door.    Never smoker, no alcohol.      Used to work at [employer].    Before that, worked in food prep.                   Review of Systems: Constitutional: Denies fever, chills, diaphoresis, appetite change and fatigue.  Respiratory: Denies SOB, DOE, cough, chest tightness, and wheezing. Denies chest pain. Cardiovascular: No chest pain, palpitations and leg swelling.  Gastrointestinal: No abdominal pain, nausea, vomiting, bloody stools Genitourinary: No dysuria, frequency, hematuria, or flank pain. She reports nocturia, which she attributes to her Lasix. Advised her to take her Lasix evening dose at around 2 to 4 PM Psych: No depression symptoms. No SI or SA.   Objective:  Physical Exam: Filed Vitals:   05/26/13 1326  BP: 137/77  Pulse: 90  Temp: 97.3 F (36.3 C)  TempSrc: Oral  Weight: 166 lb 12.8 oz (75.66 kg)  SpO2: 97%   General: Elderly woman No acute distress. Son in examination room HEENT: Normal oral mucosa. MMM.  Lungs: CTA bilaterally. Heart: RRR; no extra sounds or murmurs  Abdomen: Non-distended, normal BS, soft, nontender; no hepatosplenomegaly  Extremities: Bilateral, lower extremity edema. The dressing over her right shin ulcer is clean and dry. Peripheral pulses are present Neurologic: Normal EOM,  Alert and oriented x3. No obvious neurologic/cranial nerve deficits.  Assessment & Plan:  I have  discussed my assessment and plan  with  my attending in the clinic, Dr. Dalphine Handing  as detailed under problem based charting.

## 2013-05-26 NOTE — Assessment & Plan Note (Signed)
Asymptomatic since returning home. No palpitations or chest pain.   Plan  Cont with Xarelto  Discontinue Aspirin

## 2013-05-26 NOTE — Patient Instructions (Signed)
Please take Tylenol 2 pills three times a day as needed for pain  Please stop Aspirin and Potassium pills  We will check your labs today  Please keep your appointment with cardiology and the wound center I will give you a call

## 2013-05-27 LAB — COMPLETE METABOLIC PANEL WITH GFR
Alkaline Phosphatase: 88 U/L (ref 39–117)
BUN: 12 mg/dL (ref 6–23)
CO2: 31 mEq/L (ref 19–32)
Calcium: 8.3 mg/dL — ABNORMAL LOW (ref 8.4–10.5)
Creat: 0.75 mg/dL (ref 0.50–1.10)
GFR, Est African American: 84 mL/min
GFR, Est Non African American: 73 mL/min
Glucose, Bld: 114 mg/dL — ABNORMAL HIGH (ref 70–99)
Total Bilirubin: 1 mg/dL (ref 0.3–1.2)

## 2013-05-27 LAB — DIGOXIN LEVEL: Digoxin Level: 0.2 ng/mL — ABNORMAL LOW (ref 0.8–2.0)

## 2013-05-27 NOTE — Progress Notes (Signed)
Case discussed with Dr.Kazibwe at the time of the visit.  We reviewed the resident's history and exam and pertinent patient test results.  I agree with the assessment, diagnosis, and plan of care documented in the resident's note.    

## 2013-05-29 ENCOUNTER — Encounter (HOSPITAL_COMMUNITY): Payer: Self-pay | Admitting: Emergency Medicine

## 2013-05-29 ENCOUNTER — Emergency Department (HOSPITAL_COMMUNITY)
Admission: EM | Admit: 2013-05-29 | Discharge: 2013-05-29 | Disposition: A | Discharge status: Home / Self Care | Payer: PRIVATE HEALTH INSURANCE | Attending: Emergency Medicine | Admitting: Emergency Medicine

## 2013-05-29 DIAGNOSIS — T38801A Poisoning by unspecified hormones and synthetic substitutes, accidental (unintentional), initial encounter: Secondary | ICD-10-CM | POA: Insufficient documentation

## 2013-05-29 DIAGNOSIS — I5042 Chronic combined systolic (congestive) and diastolic (congestive) heart failure: Secondary | ICD-10-CM | POA: Insufficient documentation

## 2013-05-29 DIAGNOSIS — R42 Dizziness and giddiness: Secondary | ICD-10-CM | POA: Insufficient documentation

## 2013-05-29 DIAGNOSIS — Z8739 Personal history of other diseases of the musculoskeletal system and connective tissue: Secondary | ICD-10-CM | POA: Insufficient documentation

## 2013-05-29 DIAGNOSIS — T383X1A Poisoning by insulin and oral hypoglycemic [antidiabetic] drugs, accidental (unintentional), initial encounter: Secondary | ICD-10-CM | POA: Insufficient documentation

## 2013-05-29 DIAGNOSIS — Y9389 Activity, other specified: Secondary | ICD-10-CM | POA: Insufficient documentation

## 2013-05-29 DIAGNOSIS — T50901A Poisoning by unspecified drugs, medicaments and biological substances, accidental (unintentional), initial encounter: Secondary | ICD-10-CM

## 2013-05-29 DIAGNOSIS — T458X1A Poisoning by other primarily systemic and hematological agents, accidental (unintentional), initial encounter: Secondary | ICD-10-CM | POA: Insufficient documentation

## 2013-05-29 DIAGNOSIS — E7800 Pure hypercholesterolemia, unspecified: Secondary | ICD-10-CM | POA: Insufficient documentation

## 2013-05-29 DIAGNOSIS — T45511A Poisoning by anticoagulants, accidental (unintentional), initial encounter: Secondary | ICD-10-CM | POA: Insufficient documentation

## 2013-05-29 DIAGNOSIS — Y929 Unspecified place or not applicable: Secondary | ICD-10-CM | POA: Insufficient documentation

## 2013-05-29 DIAGNOSIS — E876 Hypokalemia: Secondary | ICD-10-CM | POA: Insufficient documentation

## 2013-05-29 DIAGNOSIS — E119 Type 2 diabetes mellitus without complications: Secondary | ICD-10-CM | POA: Insufficient documentation

## 2013-05-29 DIAGNOSIS — Z8669 Personal history of other diseases of the nervous system and sense organs: Secondary | ICD-10-CM | POA: Insufficient documentation

## 2013-05-29 DIAGNOSIS — I10 Essential (primary) hypertension: Secondary | ICD-10-CM | POA: Insufficient documentation

## 2013-05-29 DIAGNOSIS — Z79899 Other long term (current) drug therapy: Secondary | ICD-10-CM | POA: Insufficient documentation

## 2013-05-29 DIAGNOSIS — J45909 Unspecified asthma, uncomplicated: Secondary | ICD-10-CM | POA: Insufficient documentation

## 2013-05-29 LAB — COMPREHENSIVE METABOLIC PANEL
ALT: 5 U/L (ref 0–35)
AST: 18 U/L (ref 0–37)
Alkaline Phosphatase: 90 U/L (ref 39–117)
BUN: 17 mg/dL (ref 6–23)
CO2: 27 mEq/L (ref 19–32)
Calcium: 8.2 mg/dL — ABNORMAL LOW (ref 8.4–10.5)
Creatinine, Ser: 0.72 mg/dL (ref 0.50–1.10)
GFR calc Af Amer: 88 mL/min — ABNORMAL LOW (ref >90)
Glucose, Bld: 136 mg/dL — ABNORMAL HIGH (ref 70–99)
Potassium: 2.8 mEq/L — ABNORMAL LOW (ref 3.5–5.1)
Sodium: 137 mEq/L (ref 135–145)
Total Bilirubin: 0.7 mg/dL (ref 0.3–1.2)
Total Protein: 6.8 g/dL (ref 6.0–8.3)

## 2013-05-29 LAB — CBC
HCT: 39.3 % (ref 36.0–46.0)
Hemoglobin: 12.5 g/dL (ref 12.0–15.0)
MCH: 29.1 pg (ref 26.0–34.0)
MCHC: 31.8 g/dL (ref 30.0–36.0)
Platelets: 262 10*3/uL (ref 150–400)
RBC: 4.3 MIL/uL (ref 3.87–5.11)

## 2013-05-29 LAB — PROTIME-INR
INR: 3.31 — ABNORMAL HIGH (ref 0.00–1.49)
Prothrombin Time: 32.4 seconds — ABNORMAL HIGH (ref 11.6–15.2)

## 2013-05-29 LAB — GLUCOSE, CAPILLARY: Glucose-Capillary: 125 mg/dL — ABNORMAL HIGH (ref 70–99)

## 2013-05-29 MED ORDER — POTASSIUM CHLORIDE CRYS ER 20 MEQ PO TBCR
40.0000 meq | EXTENDED_RELEASE_TABLET | Freq: Once | ORAL | Status: AC
  Administered 2013-05-29: 40 meq via ORAL
  Filled 2013-05-29: qty 2

## 2013-05-29 NOTE — Progress Notes (Signed)
Case discussed with Dr.Kazibwe at the time of the visit.  We reviewed the resident's history and exam and pertinent patient test results.  I agree with the assessment, diagnosis, and plan of care documented in the resident's note.    

## 2013-05-29 NOTE — ED Notes (Signed)
Unable to get a urine she just voided

## 2013-05-29 NOTE — ED Notes (Signed)
Pt and family reports that pt became confused and took her daily meds 3 times today, that included her lisinopril, xarelto and metformin. No acute distress noted at this time.

## 2013-05-29 NOTE — ED Notes (Signed)
Pt up  To the br with walker.  Ambulated with little effort

## 2013-05-29 NOTE — ED Provider Notes (Signed)
Medical screening examination/treatment/procedure(s) were performed by non-physician practitioner and as supervising physician I was immediately available for consultation/collaboration.  Rubyann Lingle N Baylyn Sickles, DO 05/29/13 2010 

## 2013-05-29 NOTE — ED Provider Notes (Signed)
CSN: [id number]     Arrival date & time 05/29/13  1421 History   First MD Initiated Contact with Patient 05/29/13 1552     Chief Complaint  Patient presents with  . Drug Overdose   (Consider location/radiation/quality/duration/timing/severity/associated sxs/prior Treatment) HPI Comments: Pt and daughter states that by 11:30 this morning she pt took double her morning dose of medications and her evening dose of medication:pt states that she feels a little dizzy:daughter states that she went to pharmacy and pharmacist stated that it should be fine but daughter was concerned:ingestion was accidental  The history is provided by the patient and a relative. No language interpreter was used.    Past Medical History  Diagnosis Date  . Hypertension   . Diabetes mellitus   . Asthma   . Osteoarthritis     left shoulder  . Pseudogout   . Hypokalemia     a. Previously felt due to diuretics, req supplementation.  . Churg-Strauss syndrome     a. Sural nerve biopsy 02/2000.  Darene Lamer disease     a. s/p thyroidectomy.  . Glaucoma   . Hypercholesteremia   . Venous insufficiency   . Osteopenia     DEXA 2004  . Lipoma     left inner thigh  . Cataract     left eye  . VASCULITIS   . Chronic combined systolic and diastolic heart failure     a. EF 25-35% 2001 req diuresis, thoracentesis. b. EF improved to 50-55% by echo 2003. c. EF 20-25% by echo 02/2012 with mild AI, mod MR/TR, PAP , sm-mod pericardial effusion --> subsequent abnormal Myoview. Treated medically. ?Tachy-mediated.  . Pleural effusion, left     a. Thoracentesis 2001 - per notes, no malignant cells, was transudative.  Marland Kitchen NSVT (nonsustained ventricular tachycardia)     a. During CHF adm 2001.  . Multifocal atrial tachycardia     a. Documented as OP 01/2012.  Marland Kitchen Ectopic atrial tachycardia     a. Documented on tele as IP 02/2012.  . Pulmonary HTN     a. Mod by echo 02/2012.  . Valvular heart disease     a. Echo 02/2012: mod  MR/TR, mild AI.  Marland Kitchen Pericardial effusion     a. Echo 02/2012: small-mod pericardial effusion.   Past Surgical History  Procedure Laterality Date  . Abdominal hysterectomy    . Salpingoophorectomy    . Thyroidectomy    . Hemiarthroplasty hip Right 10/2010    cemented for hip fracture, femoral neck - by Eulas Post, MD  . Cataract extraction Right   . Hemiarthroplasty hip     Family History  Problem Relation Age of Onset  . Diabetes Mother   . Diabetes Father   . Heart disease Neg Hx    History  Substance Use Topics  . Smoking status: Never Smoker   . Smokeless tobacco: Never Used  . Alcohol Use: No   OB History   Grav Para Term Preterm Abortions TAB SAB Ect Mult Living                 Review of Systems  Constitutional: Negative.   Respiratory: Negative.   Cardiovascular: Negative.   Neurological: Positive for dizziness.    Allergies  Review of patient's allergies indicates no known allergies.  Home Medications   Current Outpatient Rx  Name  Route  Sig  Dispense  Refill  . acetaminophen (TYLENOL) 325 MG tablet   Oral   Take 1 tablet (  325 mg total) by mouth every 8 (eight) hours as needed for pain. May take an extra pill for pain.   60 tablet   0   . albuterol (PROVENTIL HFA;VENTOLIN HFA) 108 (90 BASE) MCG/ACT inhaler   Inhalation   Inhale 1-2 puffs into the lungs every 6 (six) hours as needed. Shortness of breath         . brimonidine (ALPHAGAN P) 0.1 % SOLN   Left Eye   Place 1 drop into the left eye every 12 (twelve) hours.   15 mL   1   . digoxin (LANOXIN) 0.125 MG tablet   Oral   Take 1 tablet (0.125 mg total) by mouth daily.   30 tablet   3   . dorzolamide (TRUSOPT) 2 % ophthalmic solution   Left Eye   Place 1 drop into the left eye 2 (two) times daily.          . furosemide (LASIX) 80 MG tablet   Oral   Take 80 mg by mouth 2 (two) times daily.         Marland Kitchen lisinopril (PRINIVIL,ZESTRIL) 20 MG tablet   Oral   Take 20 mg by mouth  daily.         . metFORMIN (GLUCOPHAGE) 1000 MG tablet   Oral   Take 1 tablet (1,000 mg total) by mouth 2 (two) times daily with a meal.   60 tablet   2   . pravastatin (PRAVACHOL) 40 MG tablet   Oral   Take 1 tablet (40 mg total) by mouth every evening.   30 tablet   11   . Rivaroxaban (XARELTO) 15 MG TABS tablet   Oral   Take 1 tablet (15 mg total) by mouth daily with supper.   30 tablet   11    BP 133/87  Pulse 66  Temp(Src) 97.4 F (36.3 C) (Oral)  Resp 20  SpO2 96% Physical Exam  Nursing note and vitals reviewed. Constitutional: She is oriented to person, place, and time. She appears well-developed and well-nourished.  HENT:  Head: Normocephalic and atraumatic.  Eyes: Conjunctivae and EOM are normal.  Neck: Neck supple.  Cardiovascular: Normal rate and regular rhythm.   Pulmonary/Chest: Effort normal and breath sounds normal.  Abdominal: Soft. Bowel sounds are normal. There is no tenderness.  Musculoskeletal: Normal range of motion.  Neurological: She is alert and oriented to person, place, and time. Coordination normal.  Skin: Skin is warm and dry.    ED Course  Procedures (including critical care time) Labs Review Labs Reviewed  CBC - Abnormal; Notable for the following:    WBC 3.0 (*)    All other components within normal limits  COMPREHENSIVE METABOLIC PANEL - Abnormal; Notable for the following:    Potassium 2.8 (*)    Glucose, Bld 136 (*)    Calcium 8.2 (*)    Albumin 3.2 (*)    GFR calc non Af Amer 76 (*)    GFR calc Af Amer 88 (*)    All other components within normal limits  APTT - Abnormal; Notable for the following:    aPTT 54 (*)    All other components within normal limits  PROTIME-INR - Abnormal; Notable for the following:    Prothrombin Time 32.4 (*)    INR 3.31 (*)    All other components within normal limits  GLUCOSE, CAPILLARY - Abnormal; Notable for the following:    Glucose-Capillary 125 (*)    All other  components within  normal limits   Imaging Review No results found.  EKG Interpretation   None       MDM   1. Accidental medication overdose, initial encounter   2. Hypokalemia    Pt is ambulating without any problem with a walker which is her baseline:daughter stays with her at night:spoke with Debbie at poison control who said that there is nothing to be done:pt given potassium replacement here and pt is okay to follow up as she was just taken off potassium by pcp    Teressa Lower, NP 05/29/13 1728

## 2013-05-29 NOTE — ED Notes (Signed)
The pt lives alone in the daytime and family stays with her  During the night.  She accidentally took 2 extra doses of meds this am around 0930.  The pt is alert skin warm and dry.  She took extra meds 1130am.  She took her xarelto and metformin.  She walks with a walker at home.  She has a wound on her rt lower that she is seen at the wound center

## 2013-05-31 ENCOUNTER — Other Ambulatory Visit: Payer: Self-pay | Admitting: Internal Medicine

## 2013-05-31 DIAGNOSIS — E876 Hypokalemia: Secondary | ICD-10-CM

## 2013-05-31 NOTE — Progress Notes (Signed)
I called patient and instructed her to take potassium at an increased dose 20 Meq three times daily (from twice daily). Son will bring her in Friday for a BMET at [facility] to make sure that her potassium is replaced with a goal of >4. I will give them a call with results.

## 2013-06-01 ENCOUNTER — Encounter: Payer: PRIVATE HEALTH INSURANCE | Admitting: Physician Assistant

## 2013-06-03 ENCOUNTER — Other Ambulatory Visit (INDEPENDENT_AMBULATORY_CARE_PROVIDER_SITE_OTHER): Payer: PRIVATE HEALTH INSURANCE

## 2013-06-03 DIAGNOSIS — E876 Hypokalemia: Secondary | ICD-10-CM

## 2013-06-03 LAB — BASIC METABOLIC PANEL WITH GFR
CO2: 28 mEq/L (ref 19–32)
Calcium: 9 mg/dL (ref 8.4–10.5)
Chloride: 103 mEq/L (ref 96–112)
GFR, Est Non African American: 63 mL/min
Potassium: 4.1 mEq/L (ref 3.5–5.3)

## 2013-06-06 ENCOUNTER — Encounter: Payer: Self-pay | Admitting: Internal Medicine

## 2013-06-06 NOTE — Progress Notes (Signed)
Quick Note:  I called patient and encouraged her to continue taking KCl pills at 20 meq tid until further instructions. Her potassium level should be maintained >4. She verbalized understanding. ______

## 2013-06-09 ENCOUNTER — Telehealth: Payer: Self-pay | Admitting: *Deleted

## 2013-06-09 NOTE — Telephone Encounter (Signed)
Done

## 2013-06-09 NOTE — Telephone Encounter (Signed)
Yes. She should take 20 meq (2 pills) three times daily. You may call her back with the information.  Thanks.  Tamy Accardo.

## 2013-06-09 NOTE — Telephone Encounter (Signed)
Received call from [facility] nurse that is seeing pt in home at this time.  She wanted to verify the dose of Potassium pt should be taking as it's different that what is on the bottle.  I read last note from Dr Zada Girt and verified dose of Kcl 10 meq - take two pills three times a day until further instructions. Next visit is 11/26.  Dr. Zada Girt - is this correct?

## 2013-06-11 ENCOUNTER — Other Ambulatory Visit: Payer: Self-pay | Admitting: Internal Medicine

## 2013-06-14 ENCOUNTER — Other Ambulatory Visit: Payer: Self-pay | Admitting: *Deleted

## 2013-06-14 MED ORDER — ALBUTEROL SULFATE HFA 108 (90 BASE) MCG/ACT IN AERS: 1.0000 | INHALATION_SPRAY | Freq: Four times a day (QID) | RESPIRATORY_TRACT | Status: DC | PRN

## 2013-06-14 NOTE — Telephone Encounter (Signed)
Pt is out completely and getting nervous about it

## 2013-06-15 ENCOUNTER — Telehealth: Payer: Self-pay | Admitting: *Deleted

## 2013-06-15 DIAGNOSIS — J45909 Unspecified asthma, uncomplicated: Secondary | ICD-10-CM

## 2013-06-15 MED ORDER — FLUTICASONE-SALMETEROL 250-50 MCG/DOSE IN AEPB: 1.0000 | INHALATION_SPRAY | Freq: Two times a day (BID) | RESPIRATORY_TRACT | Status: DC

## 2013-06-15 NOTE — Telephone Encounter (Signed)
Call from pt's son - requesting refill on Advair. He was informed Albuterol refill was sent to the pharmacy yesterday.

## 2013-06-24 ENCOUNTER — Encounter (HOSPITAL_BASED_OUTPATIENT_CLINIC_OR_DEPARTMENT_OTHER): Payer: PRIVATE HEALTH INSURANCE | Attending: General Surgery

## 2013-06-24 DIAGNOSIS — E1169 Type 2 diabetes mellitus with other specified complication: Secondary | ICD-10-CM | POA: Insufficient documentation

## 2013-06-24 DIAGNOSIS — L97809 Non-pressure chronic ulcer of other part of unspecified lower leg with unspecified severity: Secondary | ICD-10-CM | POA: Insufficient documentation

## 2013-06-27 ENCOUNTER — Other Ambulatory Visit: Payer: Self-pay | Admitting: Internal Medicine

## 2013-06-27 ENCOUNTER — Telehealth: Payer: Self-pay | Admitting: *Deleted

## 2013-06-27 NOTE — Telephone Encounter (Signed)
Nurse with [facility] called from pts home - going to Wound Care for legs. Having pain in legs about 7. Uses [facility]. Has no pain med. Stanton Kidney Jamicia Haaland RN 06/27/13 3:30PM

## 2013-06-29 ENCOUNTER — Telehealth: Payer: Self-pay | Admitting: *Deleted

## 2013-06-29 ENCOUNTER — Emergency Department (HOSPITAL_COMMUNITY): Payer: PRIVATE HEALTH INSURANCE

## 2013-06-29 ENCOUNTER — Emergency Department (HOSPITAL_COMMUNITY)
Admission: EM | Admit: 2013-06-29 | Discharge: 2013-06-29 | Disposition: A | Discharge status: Home / Self Care | Payer: PRIVATE HEALTH INSURANCE | Attending: Emergency Medicine | Admitting: Emergency Medicine

## 2013-06-29 ENCOUNTER — Encounter (HOSPITAL_COMMUNITY): Payer: Self-pay | Admitting: Emergency Medicine

## 2013-06-29 DIAGNOSIS — Z8669 Personal history of other diseases of the nervous system and sense organs: Secondary | ICD-10-CM | POA: Insufficient documentation

## 2013-06-29 DIAGNOSIS — R42 Dizziness and giddiness: Secondary | ICD-10-CM | POA: Insufficient documentation

## 2013-06-29 DIAGNOSIS — I5042 Chronic combined systolic (congestive) and diastolic (congestive) heart failure: Secondary | ICD-10-CM | POA: Insufficient documentation

## 2013-06-29 DIAGNOSIS — Z79899 Other long term (current) drug therapy: Secondary | ICD-10-CM | POA: Insufficient documentation

## 2013-06-29 DIAGNOSIS — R042 Hemoptysis: Secondary | ICD-10-CM | POA: Insufficient documentation

## 2013-06-29 DIAGNOSIS — M19019 Primary osteoarthritis, unspecified shoulder: Secondary | ICD-10-CM | POA: Insufficient documentation

## 2013-06-29 DIAGNOSIS — Z872 Personal history of diseases of the skin and subcutaneous tissue: Secondary | ICD-10-CM | POA: Insufficient documentation

## 2013-06-29 DIAGNOSIS — I472 Ventricular tachycardia, unspecified: Secondary | ICD-10-CM | POA: Insufficient documentation

## 2013-06-29 DIAGNOSIS — R5381 Other malaise: Secondary | ICD-10-CM | POA: Insufficient documentation

## 2013-06-29 DIAGNOSIS — J45909 Unspecified asthma, uncomplicated: Secondary | ICD-10-CM | POA: Insufficient documentation

## 2013-06-29 DIAGNOSIS — I4729 Other ventricular tachycardia: Secondary | ICD-10-CM | POA: Insufficient documentation

## 2013-06-29 DIAGNOSIS — I2789 Other specified pulmonary heart diseases: Secondary | ICD-10-CM | POA: Insufficient documentation

## 2013-06-29 DIAGNOSIS — IMO0002 Reserved for concepts with insufficient information to code with codable children: Secondary | ICD-10-CM | POA: Insufficient documentation

## 2013-06-29 DIAGNOSIS — E119 Type 2 diabetes mellitus without complications: Secondary | ICD-10-CM | POA: Insufficient documentation

## 2013-06-29 DIAGNOSIS — H409 Unspecified glaucoma: Secondary | ICD-10-CM | POA: Insufficient documentation

## 2013-06-29 DIAGNOSIS — E7800 Pure hypercholesterolemia, unspecified: Secondary | ICD-10-CM | POA: Insufficient documentation

## 2013-06-29 LAB — BASIC METABOLIC PANEL
BUN: 10 mg/dL (ref 6–23)
CO2: 28 mEq/L (ref 19–32)
Chloride: 101 mEq/L (ref 96–112)
Creatinine, Ser: 0.66 mg/dL (ref 0.50–1.10)
GFR calc Af Amer: >90 mL/min (ref >90)
Glucose, Bld: 101 mg/dL — ABNORMAL HIGH (ref 70–99)
Potassium: 3.8 mEq/L (ref 3.5–5.1)
Sodium: 139 mEq/L (ref 135–145)

## 2013-06-29 LAB — CBC
HCT: 36.8 % (ref 36.0–46.0)
Hemoglobin: 12 g/dL (ref 12.0–15.0)
MCV: 90.2 fL (ref 78.0–100.0)
RBC: 4.08 MIL/uL (ref 3.87–5.11)
WBC: 2.8 10*3/uL — ABNORMAL LOW (ref 4.0–10.5)

## 2013-06-29 NOTE — ED Provider Notes (Signed)
Patient seen/examined in the Emergency Department in conjunction with Resident Physician Provider [place] Patient reports "spitting up blood" but now improved Exam : awake/alert, lungs are clear.  No distress noted Plan: imaging/labs ordered.  I anticipate d/c home    Joya Gaskins, MD 06/29/13 1553

## 2013-06-29 NOTE — ED Provider Notes (Signed)
CSN: [id number]     Arrival date & time 06/29/13  1443 History   First MD Initiated Contact with Patient 06/29/13 1505     Chief Complaint  Patient presents with  . Hemoptysis  . Fatigue  . Dizziness   (Consider location/radiation/quality/duration/timing/severity/associated sxs/prior Treatment) HPI Onset was last night. She has been intermittently spitting up small amounts of blood. She normally spits up but never with blood in it. Now resolved. The pain is none. Modifying factors: none.  Associated symptoms: no chest pain, no SOB.  Recent medical care: here via EMS. Home health worker noted blood in spit and advised coming here to get evaluated.   Past Medical History  Diagnosis Date  . Hypertension   . Diabetes mellitus   . Asthma   . Osteoarthritis     left shoulder  . Pseudogout   . Hypokalemia     a. Previously felt due to diuretics, req supplementation.  . Churg-Strauss syndrome     a. Sural nerve biopsy 02/2000.  Darene Lamer disease     a. s/p thyroidectomy.  . Glaucoma   . Hypercholesteremia   . Venous insufficiency   . Osteopenia     DEXA 2004  . Lipoma     left inner thigh  . Cataract     left eye  . VASCULITIS   . Chronic combined systolic and diastolic heart failure     a. EF 25-35% 2001 req diuresis, thoracentesis. b. EF improved to 50-55% by echo 2003. c. EF 20-25% by echo 02/2012 with mild AI, mod MR/TR, PAP , sm-mod pericardial effusion --> subsequent abnormal Myoview. Treated medically. ?Tachy-mediated.  . Pleural effusion, left     a. Thoracentesis 2001 - per notes, no malignant cells, was transudative.  Marland Kitchen NSVT (nonsustained ventricular tachycardia)     a. During CHF adm 2001.  . Multifocal atrial tachycardia     a. Documented as OP 01/2012.  Marland Kitchen Ectopic atrial tachycardia     a. Documented on tele as IP 02/2012.  . Pulmonary HTN     a. Mod by echo 02/2012.  . Valvular heart disease     a. Echo 02/2012: mod MR/TR, mild AI.  Marland Kitchen Pericardial effusion    a. Echo 02/2012: small-mod pericardial effusion.   Past Surgical History  Procedure Laterality Date  . Abdominal hysterectomy    . Salpingoophorectomy    . Thyroidectomy    . Hemiarthroplasty hip Right 10/2010    cemented for hip fracture, femoral neck - by Eulas Post, MD  . Cataract extraction Right   . Hemiarthroplasty hip     Family History  Problem Relation Age of Onset  . Diabetes Mother   . Diabetes Father   . Heart disease Neg Hx    History  Substance Use Topics  . Smoking status: Never Smoker   . Smokeless tobacco: Never Used  . Alcohol Use: No   OB History   Grav Para Term Preterm Abortions TAB SAB Ect Mult Living                 Review of Systems Constitutional: Negative for fever.  Eyes: Negative for vision loss.  ENT: Negative for difficulty swallowing.  Cardiovascular: Negative for chest pain. Respiratory: Negative for respiratory distress.  Gastrointestinal:  Negative for vomiting.  Genitourinary: Negative for inability to void.  Musculoskeletal: Negative for gait problem.  Integumentary: Negative for rash.  Neurological: Negative for new focal weakness.     Allergies  Review of patient's  allergies indicates no known allergies.  Home Medications   Current Outpatient Rx  Name  Route  Sig  Dispense  Refill  . acetaminophen (TYLENOL) 325 MG tablet   Oral   Take 1 tablet (325 mg total) by mouth every 8 (eight) hours as needed for pain. May take an extra pill for pain.   60 tablet   0   . albuterol (PROVENTIL HFA;VENTOLIN HFA) 108 (90 BASE) MCG/ACT inhaler   Inhalation   Inhale 1-2 puffs into the lungs every 6 (six) hours as needed for shortness of breath.   18 g   11   . brimonidine (ALPHAGAN P) 0.1 % SOLN   Left Eye   Place 1 drop into the left eye every 12 (twelve) hours.   15 mL   1   . digoxin (LANOXIN) 0.125 MG tablet   Oral   Take 1 tablet (0.125 mg total) by mouth daily.   30 tablet   3   . dorzolamide (TRUSOPT) 2 %  ophthalmic solution   Left Eye   Place 1 drop into the left eye 2 (two) times daily.          . Fluticasone-Salmeterol (ADVAIR) 250-50 MCG/DOSE AEPB   Inhalation   Inhale 1 puff into the lungs 2 (two) times daily.   60 each   11   . furosemide (LASIX) 80 MG tablet   Oral   Take 80 mg by mouth 2 (two) times daily.         Marland Kitchen lisinopril (PRINIVIL,ZESTRIL) 20 MG tablet   Oral   Take 20 mg by mouth daily.         . metFORMIN (GLUCOPHAGE) 1000 MG tablet   Oral   Take 1 tablet (1,000 mg total) by mouth 2 (two) times daily with a meal.   60 tablet   2   . metoprolol succinate (TOPROL-XL) 50 MG 24 hr tablet   Oral   Take 100 mg by mouth daily.         . potassium chloride (MICRO-K) 10 MEQ CR capsule   Oral   Take 20 mEq by mouth 3 (three) times daily. Increased Potassioum dose to three times a day         . pravastatin (PRAVACHOL) 40 MG tablet   Oral   Take 1 tablet (40 mg total) by mouth every evening.   30 tablet   11   . Rivaroxaban (XARELTO) 15 MG TABS tablet   Oral   Take 1 tablet (15 mg total) by mouth daily with supper.   30 tablet   11    BP 148/79  Pulse 66  Temp(Src) 98.5 F (36.9 C) (Oral)  Resp 20  SpO2 98% Physical Exam Nursing note and vitals reviewed.  Constitutional: Pt is alert and appears stated age. Head: Atraumatic, normocephalic. Eyes: No injection, no scleral icterus. Ears: Normal Nose Normal Throat: Small area of irritation at top of mouth. Uvula midline, no exudates.  Respiratory: No respiratory distress. Equal breathing bilaterally. Cardiovascular: Normal rate. Extremities warm and well perfused.  Abdomen: Soft, non-tender. MSK: Extremities are atraumatic without deformity. Skin: No rash, no wounds.   Neuro: No motor nor sensory deficit.     ED Course  Procedures (including critical care time) Labs Review Labs Reviewed  CBC - Abnormal; Notable for the following:    WBC 2.8 (*)    All other components within normal  limits  BASIC METABOLIC PANEL - Abnormal; Notable for the following:  Glucose, Bld 101 (*)    GFR calc non Af Amer 78 (*)    All other components within normal limits  PROTIME-INR - Abnormal; Notable for the following:    Prothrombin Time 16.6 (*)    All other components within normal limits  DIGOXIN LEVEL   Imaging Review Dg Chest 2 View  06/29/2013   CLINICAL DATA:  Hemoptysis.  Dizziness.  EXAM: CHEST  2 VIEW  COMPARISON:  05/20/2013  FINDINGS: There is marked cardiac enlargement. There is no pleural effusion or edema Both lungs are clear. The visualized skeletal structures are unremarkable.  IMPRESSION: No active cardiopulmonary disease.   Electronically Signed   By: Signa Kell M.D.   On: 06/29/2013 16:32    EKG Interpretation   None       MDM   1. Spitting up blood    79 y.o. female presents w/ spitting up small amount of blood since yesterday. Not associated with cough or emesis. Pt looks well. Spitting up blood has now resolved. Pt denies feeling dizzy or weak to me. Family reports patient at baseline. Exam with potential source of bleeding in mouth. CXR normal. Labs unremarkable. No episodes during ED stay. Will d/c home with pcp follow up. Counseling provided regarding diagnosis, treatment plan, follow up recommendations, and return precautions. Questions answered.       I independently viewed, interpreted, and used in my medical decision making all ordered lab and imaging tests. Medical Decision Making discussed with ED attending Joya Gaskins, MD      Charm Barges, MD 06/29/13 1723

## 2013-06-29 NOTE — Telephone Encounter (Signed)
Home health nurse calls and states pt started "spitting up" blood last pm, vital signs are stable per [facility], pt is alert and oriented. It is very apparent blood, this is not coughing that is the cause per evaluation, pt states she just needs to spit and it is bloody. It is at intervals but she has a jar that is noted to be bloody sputum. She is on xarelto. HHN is advised to ask pt to go to urg care or ED asap, pt states her son will come pick her up and bring her.

## 2013-06-29 NOTE — Telephone Encounter (Signed)
Agree, she needs urgent evaluation.  Thanks

## 2013-06-29 NOTE — ED Notes (Signed)
Pt. Has c/o "spitting up blood" since lastnight. Pt. reports feeling dizzy and weak.  Pt. Denies any injuries, fevers, or SOB.  Pt. Was sent her by Home health nurse.

## 2013-06-30 NOTE — Telephone Encounter (Signed)
Pt's family stated he had called 2 days ago.  Can u refill med?  Thanks

## 2013-06-30 NOTE — ED Provider Notes (Signed)
I have personally seen and examined the patient.  I have discussed the plan of care with the resident.  I have reviewed the documentation on PMH/FH/Soc. History.  I have reviewed the documentation of the resident and agree.   Joya Gaskins, MD 06/30/13 [phone]

## 2013-06-30 NOTE — Telephone Encounter (Signed)
Refill done - pt called, no answer; message left.

## 2013-07-02 ENCOUNTER — Encounter (HOSPITAL_COMMUNITY): Payer: Self-pay | Admitting: Emergency Medicine

## 2013-07-02 ENCOUNTER — Emergency Department (HOSPITAL_COMMUNITY)
Admission: EM | Admit: 2013-07-02 | Discharge: 2013-07-02 | Disposition: A | Discharge status: Home / Self Care | Payer: PRIVATE HEALTH INSURANCE | Attending: Emergency Medicine | Admitting: Emergency Medicine

## 2013-07-02 ENCOUNTER — Emergency Department (HOSPITAL_COMMUNITY): Payer: PRIVATE HEALTH INSURANCE

## 2013-07-02 DIAGNOSIS — R110 Nausea: Secondary | ICD-10-CM | POA: Insufficient documentation

## 2013-07-02 DIAGNOSIS — R6889 Other general symptoms and signs: Secondary | ICD-10-CM | POA: Insufficient documentation

## 2013-07-02 DIAGNOSIS — Z79899 Other long term (current) drug therapy: Secondary | ICD-10-CM | POA: Insufficient documentation

## 2013-07-02 DIAGNOSIS — K8020 Calculus of gallbladder without cholecystitis without obstruction: Secondary | ICD-10-CM | POA: Insufficient documentation

## 2013-07-02 DIAGNOSIS — Z8669 Personal history of other diseases of the nervous system and sense organs: Secondary | ICD-10-CM | POA: Insufficient documentation

## 2013-07-02 DIAGNOSIS — I2789 Other specified pulmonary heart diseases: Secondary | ICD-10-CM | POA: Insufficient documentation

## 2013-07-02 DIAGNOSIS — R630 Anorexia: Secondary | ICD-10-CM | POA: Insufficient documentation

## 2013-07-02 DIAGNOSIS — E119 Type 2 diabetes mellitus without complications: Secondary | ICD-10-CM | POA: Insufficient documentation

## 2013-07-02 DIAGNOSIS — J159 Unspecified bacterial pneumonia: Secondary | ICD-10-CM | POA: Insufficient documentation

## 2013-07-02 DIAGNOSIS — J189 Pneumonia, unspecified organism: Secondary | ICD-10-CM

## 2013-07-02 DIAGNOSIS — J45909 Unspecified asthma, uncomplicated: Secondary | ICD-10-CM | POA: Insufficient documentation

## 2013-07-02 DIAGNOSIS — Z7901 Long term (current) use of anticoagulants: Secondary | ICD-10-CM | POA: Insufficient documentation

## 2013-07-02 DIAGNOSIS — M19019 Primary osteoarthritis, unspecified shoulder: Secondary | ICD-10-CM | POA: Insufficient documentation

## 2013-07-02 DIAGNOSIS — R12 Heartburn: Secondary | ICD-10-CM | POA: Insufficient documentation

## 2013-07-02 DIAGNOSIS — I5042 Chronic combined systolic (congestive) and diastolic (congestive) heart failure: Secondary | ICD-10-CM | POA: Insufficient documentation

## 2013-07-02 LAB — CBC WITH DIFFERENTIAL/PLATELET
Basophils Absolute: 0 10*3/uL (ref 0.0–0.1)
Basophils Relative: 2 % — ABNORMAL HIGH (ref 0–1)
Eosinophils Relative: 10 % — ABNORMAL HIGH (ref 0–5)
HCT: 37.3 % (ref 36.0–46.0)
Hemoglobin: 12.1 g/dL (ref 12.0–15.0)
Lymphs Abs: 1.1 10*3/uL (ref 0.7–4.0)
MCH: 29.8 pg (ref 26.0–34.0)
MCV: 91.9 fL (ref 78.0–100.0)
Monocytes Relative: 15 % — ABNORMAL HIGH (ref 3–12)
Neutro Abs: 0.7 10*3/uL — ABNORMAL LOW (ref 1.7–7.7)
Platelets: 210 10*3/uL (ref 150–400)
RBC: 4.06 MIL/uL (ref 3.87–5.11)
WBC: 2.4 10*3/uL — ABNORMAL LOW (ref 4.0–10.5)

## 2013-07-02 LAB — BASIC METABOLIC PANEL
CO2: 28 mEq/L (ref 19–32)
Chloride: 102 mEq/L (ref 96–112)
Creatinine, Ser: 0.65 mg/dL (ref 0.50–1.10)
GFR calc Af Amer: >90 mL/min (ref >90)
Glucose, Bld: 116 mg/dL — ABNORMAL HIGH (ref 70–99)
Potassium: 3.6 mEq/L (ref 3.5–5.1)

## 2013-07-02 MED ORDER — LIDOCAINE VISCOUS 2 % MT SOLN
15.0000 mL | Freq: Once | OROMUCOSAL | Status: AC
  Administered 2013-07-02: 15 mL via OROMUCOSAL
  Filled 2013-07-02: qty 15

## 2013-07-02 MED ORDER — LEVOFLOXACIN 750 MG PO TABS: 750.0000 mg | ORAL_TABLET | Freq: Every day | ORAL | Status: DC

## 2013-07-02 MED ORDER — ALUM & MAG HYDROXIDE-SIMETH 200-200-20 MG/5ML PO SUSP
30.0000 mL | Freq: Once | ORAL | Status: AC
  Administered 2013-07-02: 30 mL via ORAL
  Filled 2013-07-02: qty 30

## 2013-07-02 NOTE — ED Notes (Signed)
Pt arrived to ED with a complaint of abdominal pain.  Pt states she has had blood tinged sputum for several days.  Pt states she has been having abdominal pain located mid line medially.  Pt was seen here several days ago for same and her medication regiment was change after her visit to her PCP.  Pt states she had a bowel movement about an hour from this note time.

## 2013-07-02 NOTE — ED Provider Notes (Signed)
CSN: [id number]     Arrival date & time 07/02/13  0549 History   First MD Initiated Contact with Patient 07/02/13 0606     Chief Complaint  Patient presents with  . Abdominal Pain  . Nausea   (Consider location/radiation/quality/duration/timing/severity/associated sxs/prior Treatment) Patient is a 79 y.o. female presenting with abdominal pain. The history is provided by the patient. No language interpreter was used.  Abdominal Pain Pain location:  Epigastric Pain quality: aching   Pain radiates to:  Does not radiate Pain severity:  Mild Duration:  2 days Context: not recent illness, not sick contacts and not suspicious food intake   Associated symptoms: nausea   Associated symptoms: no chills, no diarrhea, no dysuria, no fever and no vomiting   Pt is a 79 year old female who presents with c/o abdominal pain and nausea. She reports that over the last couple days she has had an upset stomach and her belly has been hurting. She reports that her appetite has been decreased. She denies vomiting, diarrhea, constipation or suspicious food exposure. She reports that her stomach feels irritated and sometime she has a burning sensation in her throat and some heartburn the last few days. She also reports having a cough over the last 2-3 days and she reports coughing up blood tinged mucous. She denies chest pain, shortness of breath, chills or fever. She denies recent sick exposure or illness. She does report that they have had the heat on in her house and feels like it is dry. She periodically uses a humidifier but has not used one this cold season yet.   Past Medical History  Diagnosis Date  . Hypertension   . Diabetes mellitus   . Asthma   . Osteoarthritis     left shoulder  . Pseudogout   . Hypokalemia     a. Previously felt due to diuretics, req supplementation.  . Churg-Strauss syndrome     a. Sural nerve biopsy 02/2000.  Darene Lamer disease     a. s/p thyroidectomy.  . Glaucoma   .  Hypercholesteremia   . Venous insufficiency   . Osteopenia     DEXA 2004  . Lipoma     left inner thigh  . Cataract     left eye  . VASCULITIS   . Chronic combined systolic and diastolic heart failure     a. EF 25-35% 2001 req diuresis, thoracentesis. b. EF improved to 50-55% by echo 2003. c. EF 20-25% by echo 02/2012 with mild AI, mod MR/TR, PAP , sm-mod pericardial effusion --> subsequent abnormal Myoview. Treated medically. ?Tachy-mediated.  . Pleural effusion, left     a. Thoracentesis 2001 - per notes, no malignant cells, was transudative.  Marland Kitchen NSVT (nonsustained ventricular tachycardia)     a. During CHF adm 2001.  . Multifocal atrial tachycardia     a. Documented as OP 01/2012.  Marland Kitchen Ectopic atrial tachycardia     a. Documented on tele as IP 02/2012.  . Pulmonary HTN     a. Mod by echo 02/2012.  . Valvular heart disease     a. Echo 02/2012: mod MR/TR, mild AI.  Marland Kitchen Pericardial effusion     a. Echo 02/2012: small-mod pericardial effusion.   Past Surgical History  Procedure Laterality Date  . Abdominal hysterectomy    . Salpingoophorectomy    . Thyroidectomy    . Hemiarthroplasty hip Right 10/2010    cemented for hip fracture, femoral neck - by Eulas Post, MD  .  Cataract extraction Right   . Hemiarthroplasty hip     Family History  Problem Relation Age of Onset  . Diabetes Mother   . Diabetes Father   . Heart disease Neg Hx    History  Substance Use Topics  . Smoking status: Never Smoker   . Smokeless tobacco: Never Used  . Alcohol Use: No   OB History   Grav Para Term Preterm Abortions TAB SAB Ect Mult Living                 Review of Systems  Constitutional: Negative for fever and chills.  Gastrointestinal: Positive for nausea and abdominal pain. Negative for vomiting and diarrhea.  Genitourinary: Negative for dysuria and decreased urine volume.  All other systems reviewed and are negative.    Allergies  Review of patient's allergies indicates no  known allergies.  Home Medications   Current Outpatient Rx  Name  Route  Sig  Dispense  Refill  . acetaminophen (TYLENOL) 325 MG tablet   Oral   Take 1 tablet (325 mg total) by mouth every 8 (eight) hours as needed for pain. May take an extra pill for pain.   60 tablet   0   . albuterol (PROVENTIL HFA;VENTOLIN HFA) 108 (90 BASE) MCG/ACT inhaler   Inhalation   Inhale 1-2 puffs into the lungs every 6 (six) hours as needed for shortness of breath.   18 g   11   . brimonidine (ALPHAGAN P) 0.1 % SOLN   Left Eye   Place 1 drop into the left eye every 12 (twelve) hours.   15 mL   1   . digoxin (LANOXIN) 0.125 MG tablet   Oral   Take 1 tablet (0.125 mg total) by mouth daily.   30 tablet   3   . dorzolamide (TRUSOPT) 2 % ophthalmic solution   Left Eye   Place 1 drop into the left eye 2 (two) times daily.          . Fluticasone-Salmeterol (ADVAIR) 250-50 MCG/DOSE AEPB   Inhalation   Inhale 1 puff into the lungs 2 (two) times daily.   60 each   11   . furosemide (LASIX) 80 MG tablet   Oral   Take 80 mg by mouth 2 (two) times daily.         Marland Kitchen lisinopril (PRINIVIL,ZESTRIL) 20 MG tablet   Oral   Take 20 mg by mouth daily.         . metFORMIN (GLUCOPHAGE) 1000 MG tablet   Oral   Take 1 tablet (1,000 mg total) by mouth 2 (two) times daily with a meal.   60 tablet   2   . metoprolol succinate (TOPROL-XL) 50 MG 24 hr tablet   Oral   Take 100 mg by mouth daily.         . potassium chloride (MICRO-K) 10 MEQ CR capsule      TAKE 2 CAPSULES BY MOUTH TWICE DAILY   120 capsule   0   . pravastatin (PRAVACHOL) 40 MG tablet   Oral   Take 1 tablet (40 mg total) by mouth every evening.   30 tablet   11   . Rivaroxaban (XARELTO) 15 MG TABS tablet   Oral   Take 1 tablet (15 mg total) by mouth daily with supper.   30 tablet   11    BP 166/86  Pulse 65  Temp(Src) 98.6 F (37 C) (Oral)  Resp 18  SpO2  100% Physical Exam  Nursing note and vitals  reviewed. Constitutional: She is oriented to person, place, and time. She appears well-developed and well-nourished. No distress.  HENT:  Head: Normocephalic and atraumatic.  Mouth/Throat: Oropharynx is clear and moist.  Eyes: Conjunctivae and EOM are normal.  Neck: Normal range of motion. Neck supple. No JVD present. No tracheal deviation present. No thyromegaly present.  Cardiovascular: Normal rate, regular rhythm and normal heart sounds.   Pulmonary/Chest: Effort normal and breath sounds normal. No respiratory distress.  Abdominal: Soft. Bowel sounds are normal. There is tenderness in the epigastric area.  Musculoskeletal: Normal range of motion.  Lymphadenopathy:    She has no cervical adenopathy.  Neurological: She is alert and oriented to person, place, and time.  Skin: Skin is warm and dry.  Psychiatric: She has a normal mood and affect. Her behavior is normal. Judgment and thought content normal.    ED Course  Procedures (including critical care time) Labs Review Labs Reviewed  CBC WITH DIFFERENTIAL  BASIC METABOLIC PANEL   Imaging Review No results found.  EKG Interpretation   None       MDM   1. Community acquired pneumonia   2. Cholelithiasis     Chest x-ray; perihilar infiltrates, suggestive of pneumonia. Has been spitting up blood for a couple days. No history of fever or chills. Cholelithiasis via ultrasound, no acute cholecystitis. Mild abdominal tenderness and decreased appetite. Monitor symptoms, return to PCP on Monday or Tuesday for follow-up. Levaquin prescribed for pneumonia. No difficulty breathing, shortness of breath, hypoxia or tachypnea here. VS stable for discharge.      Irish Elders, NP 07/02/13 [phone]

## 2013-07-02 NOTE — ED Notes (Signed)
Pt states she has been spitting up blood for two to three days and she has abdominal pain,  She followed up with her PCP and they increased her "blood thinner" and potassium tablets

## 2013-07-02 NOTE — ED Notes (Signed)
Pt states not actually vomiting just spitting up dark blood

## 2013-07-04 NOTE — ED Provider Notes (Signed)
Medical screening examination/treatment/procedure(s) were conducted as a shared visit with non-physician practitioner(s) and myself.  I personally evaluated the patient during the encounter.  EKG Interpretation   None       Overall well appearing. Two complaints, likely unrelated. Mild PNA, home on abx. [facility] follow up for gallstones. No cholecystitis. Negative murphys  Lyanne Co, MD 07/04/13 [phone]

## 2013-07-13 ENCOUNTER — Encounter: Payer: PRIVATE HEALTH INSURANCE | Admitting: Internal Medicine

## 2013-07-18 ENCOUNTER — Ambulatory Visit (INDEPENDENT_AMBULATORY_CARE_PROVIDER_SITE_OTHER): Payer: PRIVATE HEALTH INSURANCE | Admitting: Internal Medicine

## 2013-07-18 ENCOUNTER — Encounter: Payer: Self-pay | Admitting: Internal Medicine

## 2013-07-18 VITALS — BP 141/79 | HR 74 | Temp 99.4°F | Wt 158.1 lb

## 2013-07-18 DIAGNOSIS — I509 Heart failure, unspecified: Secondary | ICD-10-CM

## 2013-07-18 DIAGNOSIS — I4891 Unspecified atrial fibrillation: Secondary | ICD-10-CM

## 2013-07-18 DIAGNOSIS — E7800 Pure hypercholesterolemia, unspecified: Secondary | ICD-10-CM

## 2013-07-18 DIAGNOSIS — I5043 Acute on chronic combined systolic (congestive) and diastolic (congestive) heart failure: Secondary | ICD-10-CM

## 2013-07-18 DIAGNOSIS — J189 Pneumonia, unspecified organism: Secondary | ICD-10-CM

## 2013-07-18 LAB — BASIC METABOLIC PANEL WITH GFR
BUN: 15 mg/dL (ref 6–23)
CO2: 25 mEq/L (ref 19–32)
Calcium: 8.9 mg/dL (ref 8.4–10.5)
Chloride: 103 mEq/L (ref 96–112)
Creat: 0.63 mg/dL (ref 0.50–1.10)
Potassium: 4 mEq/L (ref 3.5–5.3)

## 2013-07-18 LAB — GLUCOSE, CAPILLARY: Glucose-Capillary: 96 mg/dL (ref 70–99)

## 2013-07-18 NOTE — Assessment & Plan Note (Signed)
Assessment Patient was recently treated for community-acquired pneumonia with Levaquin.  She is symptom-free now.  Plan - Repeat chest x-ray in 6 weeks

## 2013-07-18 NOTE — Progress Notes (Signed)
Patient ID: [name], female   DOB: [birth year], 79 y.o.   MRN: [id number]    Patient: [name]   MRN: [id number]  DOB: 1926/04/23  PCP: Evelena Peat, DO   Subjective:    CC: Complications w/medications   HPI: [name] is a 79 y.o. female with a PMHx of HTN, DM, Asthma, Churg-Strauss syndrome, NSVT, A fib on Xarelto, CHF with EF of 20-25%, MR and TR, recent hospitalization in Sep.2014  for CHF and new onset A fib, who presented to clinic today for the follow up visit.  Patient states that she is doing well and no complaints. She endorses medical compliance. States that she is wondering why she takes so many medications and would like to discuss with me about her medications. Her son states that she seems to be sleepy at times during the day since she was started on multiple medications in Sep. 2014 though patient denies it. Her discharge weight was 165 lbs. She is 158 lbs today.  Of note, she was recently evaluated at [facility] ED for CAP and was treated with Levaquin. She has finished the ABx course now states that she feels fine now.      Current Outpatient Medications: Current Outpatient Prescriptions  Medication Sig Dispense Refill  . albuterol (PROVENTIL HFA;VENTOLIN HFA) 108 (90 BASE) MCG/ACT inhaler Inhale 1-2 puffs into the lungs every 6 (six) hours as needed for shortness of breath.  18 g  11  . brimonidine (ALPHAGAN P) 0.1 % SOLN Place 1 drop into the left eye every 12 (twelve) hours.  15 mL  1  . digoxin (LANOXIN) 0.125 MG tablet Take 1 tablet (0.125 mg total) by mouth daily.  30 tablet  3  . dorzolamide (TRUSOPT) 2 % ophthalmic solution Place 1 drop into the left eye 2 (two) times daily.       . Fluticasone-Salmeterol (ADVAIR) 250-50 MCG/DOSE AEPB Inhale 1 puff into the lungs 2 (two) times daily.  60 each  11  . furosemide (LASIX) 80 MG tablet Take 80 mg by mouth 2 (two) times daily.      Marland Kitchen lisinopril (PRINIVIL,ZESTRIL) 20 MG tablet Take 20 mg by mouth daily.      .  metFORMIN (GLUCOPHAGE) 1000 MG tablet Take 1 tablet (1,000 mg total) by mouth 2 (two) times daily with a meal.  60 tablet  2  . metoprolol succinate (TOPROL-XL) 50 MG 24 hr tablet Take 100 mg by mouth daily.      . potassium chloride (MICRO-K) 10 MEQ CR capsule TAKE 2 CAPSULES BY MOUTH TWICE DAILY  120 capsule  0  . Rivaroxaban (XARELTO) 15 MG TABS tablet Take 1 tablet (15 mg total) by mouth daily with supper.  30 tablet  11   No current facility-administered medications for this visit.    Allergies: No Known Allergies  Past Medical History  Diagnosis Date  . Hypertension   . Diabetes mellitus   . Asthma   . Osteoarthritis     left shoulder  . Pseudogout   . Hypokalemia     a. Previously felt due to diuretics, req supplementation.  . Churg-Strauss syndrome     a. Sural nerve biopsy 02/2000.  Darene Lamer disease     a. s/p thyroidectomy.  . Glaucoma   . Hypercholesteremia   . Venous insufficiency   . Osteopenia     DEXA 2004  . Lipoma     left inner thigh  . Cataract     left eye  .  VASCULITIS   . Chronic combined systolic and diastolic heart failure     a. EF 25-35% 2001 req diuresis, thoracentesis. b. EF improved to 50-55% by echo 2003. c. EF 20-25% by echo 02/2012 with mild AI, mod MR/TR, PAP , sm-mod pericardial effusion --> subsequent abnormal Myoview. Treated medically. ?Tachy-mediated.  . Pleural effusion, left     a. Thoracentesis 2001 - per notes, no malignant cells, was transudative.  Marland Kitchen NSVT (nonsustained ventricular tachycardia)     a. During CHF adm 2001.  . Multifocal atrial tachycardia     a. Documented as OP 01/2012.  Marland Kitchen Ectopic atrial tachycardia     a. Documented on tele as IP 02/2012.  . Pulmonary HTN     a. Mod by echo 02/2012.  . Valvular heart disease     a. Echo 02/2012: mod MR/TR, mild AI.  Marland Kitchen Pericardial effusion     a. Echo 02/2012: small-mod pericardial effusion.    Objective:    Physical Exam: Filed Vitals:   07/18/13 1115  BP: 141/79    Pulse: 74  Temp: 99.4 F (37.4 C)  TempSrc: Oral  Weight: 158 lb 1.6 oz (71.714 kg)  SpO2: 97%    General: Vital signs reviewed and noted. Well-developed, well-nourished, in no acute distress; alert, appropriate and cooperative throughout examination.  Head: Normocephalic, atraumatic.  Lungs:  Normal respiratory effort. Clear to auscultation BL without crackles or wheezes.  Heart:  irregularly irregular. S1 and S2 normal without gallop, rubs. 2-3/6 systolic murmur noted  Abdomen:  BS normoactive. Soft, Nondistended, non-tender.  No masses or organomegaly.  Extremities: No pretibial edema.   Assessment/ Plan:

## 2013-07-18 NOTE — Assessment & Plan Note (Signed)
Assessment Patient was hospitalized for CHF and new onset A fib in Sep. 2014. She is on metoprolol and Digoxin for rate control, and xarelto for anticoagulation therapy. She endorses medical compliance.   Plan: - Continue current therapy - Digi level  - cardiology referral.

## 2013-07-18 NOTE — Assessment & Plan Note (Addendum)
Assessment: Patient states that she is doing well and no complaints. She endorses medical compliance. States that she is wondering why she takes so many medications and would like to discuss with me about her medications. Her son states that she seems to be sleepy at times during the day since she was started on multiple medications in Sep. 2014 though patient denies it.  Plan: - will remove statin given the risk outweighs the benefit at her age.

## 2013-07-18 NOTE — Patient Instructions (Signed)
1. Will send you for cardiology referral 2. Stop Pravastatin 3. Follow up in 3 months Heart Failure Heart failure is a condition in which the heart has trouble pumping blood. This means your heart does not pump blood efficiently for your body to work well. In some cases of heart failure, fluid may back up into your lungs or you may have swelling (edema) in your lower legs. Heart failure is usually a long-term (chronic) condition. It is important for you to take good care of yourself and follow your caregiver's treatment plan. CAUSES  Some health conditions can cause heart failure. Those health conditions include:  High blood pressure (hypertension) causes the heart muscle to work harder than normal. When pressure in the blood vessels is high, the heart needs to pump (contract) with more force in order to circulate blood throughout the body. High blood pressure eventually causes the heart to become stiff and weak.  Coronary artery disease (CAD) is the buildup of cholesterol and fat (plaque) in the arteries of the heart. The blockage in the arteries deprives the heart muscle of oxygen and blood. This can cause chest pain and may lead to a heart attack. High blood pressure can also contribute to CAD.  Heart attack (myocardial infarction) occurs when 1 or more arteries in the heart become blocked. The loss of oxygen damages the muscle tissue of the heart. When this happens, part of the heart muscle dies. The injured tissue does not contract as well and weakens the heart's ability to pump blood.  Abnormal heart valves can cause heart failure when the heart valves do not open and close properly. This makes the heart muscle pump harder to keep the blood flowing.  Heart muscle disease (cardiomyopathy or myocarditis) is damage to the heart muscle from a variety of causes. These can include drug or alcohol abuse, infections, or unknown reasons. These can increase the risk of heart failure.  Lung disease  makes the heart work harder because the lungs do not work properly. This can cause a strain on the heart, leading it to fail.  Diabetes increases the risk of heart failure. High blood sugar contributes to high fat (lipid) levels in the blood. Diabetes can also cause slow damage to tiny blood vessels that carry important nutrients to the heart muscle. When the heart does not get enough oxygen and food, it can cause the heart to become weak and stiff. This leads to a heart that does not contract efficiently.  Other conditions can contribute to heart failure. These include abnormal heart rhythms, thyroid problems, and low blood counts (anemia). Certain unhealthy behaviors can increase the risk of heart failure. Those unhealthy behaviors include:  Being overweight.  Smoking or chewing tobacco.  Eating foods high in fat and cholesterol.  Abusing illicit drugs or alcohol.  Lacking physical activity. SYMPTOMS  Heart failure symptoms may vary and can be hard to detect. Symptoms may include:  Shortness of breath with activity, such as climbing stairs.  Persistent cough.  Swelling of the feet, ankles, legs, or abdomen.  Unexplained weight gain.  Difficulty breathing when lying flat (orthopnea).  Waking from sleep because of the need to sit up and get more air.  Rapid heartbeat.  Fatigue and loss of energy.  Feeling lightheaded, dizzy, or close to fainting.  Loss of appetite.  Nausea.  Increased urination during the night (nocturia). DIAGNOSIS  A diagnosis of heart failure is based on your history, symptoms, physical examination, and diagnostic tests. Diagnostic tests for  heart failure may include:  Echocardiography.  Electrocardiography.  Chest X-ray.  Blood tests.  Exercise stress test.  Cardiac angiography.  Radionuclide scans. TREATMENT  Treatment is aimed at managing the symptoms of heart failure. Medicines, behavioral changes, or surgical intervention may be  necessary to treat heart failure.  Medicines to help treat heart failure may include:  Angiotensin-converting enzyme (ACE) inhibitors. This type of medicine blocks the effects of a blood protein called angiotensin-converting enzyme. ACE inhibitors relax (dilate) the blood vessels and help lower blood pressure.  Angiotensin receptor blockers. This type of medicine blocks the actions of a blood protein called angiotensin. Angiotensin receptor blockers dilate the blood vessels and help lower blood pressure.  Water pills (diuretics). Diuretics cause the kidneys to remove salt and water from the blood. The extra fluid is removed through urination. This loss of extra fluid lowers the volume of blood the heart pumps.  Beta blockers. These prevent the heart from beating too fast and improve heart muscle strength.  Digitalis. This increases the force of the heartbeat.  Healthy behavior changes include:  Obtaining and maintaining a healthy weight.  Stopping smoking or chewing tobacco.  Eating heart healthy foods.  Limiting or avoiding alcohol.  Stopping illicit drug use.  Physical activity as directed by your caregiver.  Surgical treatment for heart failure may include:  A procedure to open blocked arteries, repair damaged heart valves, or remove damaged heart muscle tissue.  A pacemaker to improve heart muscle function and control certain abnormal heart rhythms.  An internal cardioverter defibrillator to treat certain serious abnormal heart rhythms.  A left ventricular assist device to assist the pumping ability of the heart. HOME CARE INSTRUCTIONS   Take your medicine as directed by your caregiver. Medicines are important in reducing the workload of your heart, slowing the progression of heart failure, and improving your symptoms.  Do not stop taking your medicine unless directed by your caregiver.  Do not skip any dose of medicine.  Refill your prescriptions before you run out  of medicine. Your medicines are needed every day.  Take over-the-counter medicine only as directed by your caregiver or pharmacist.  Engage in moderate physical activity if directed by your caregiver. Moderate physical activity can benefit some people. The elderly and people with severe heart failure should consult with a caregiver for physical activity recommendations.  Eat heart healthy foods. Food choices should be free of trans fat and low in saturated fat, cholesterol, and salt (sodium). Healthy choices include fresh or frozen fruits and vegetables, fish, lean meats, legumes, fat-free or low-fat dairy products, and whole grain or high fiber foods. Talk to a dietitian to learn more about heart healthy foods.  Limit sodium if directed by your caregiver. Sodium restriction may reduce symptoms of heart failure in some people. Talk to a dietitian to learn more about heart healthy seasonings.  Use healthy cooking methods. Healthy cooking methods include roasting, grilling, broiling, baking, poaching, steaming, or stir-frying. Talk to a dietitian to learn more about healthy cooking methods.  Limit fluids if directed by your caregiver. Fluid restriction may reduce symptoms of heart failure in some people.  Weigh yourself every day. Daily weights are important in the early recognition of excess fluid. You should weigh yourself every morning after you urinate and before you eat breakfast. Wear the same amount of clothing each time you weigh yourself. Record your daily weight. Provide your caregiver with your weight record.  Monitor and record your blood pressure if directed by  your caregiver.  Check your pulse if directed by your caregiver.  Lose weight if directed by your caregiver. Weight loss may reduce symptoms of heart failure in some people.  Stop smoking or chewing tobacco. Nicotine makes your heart work harder by causing your blood vessels to constrict. Do not use nicotine gum or patches  before talking to your caregiver.  Schedule and attend follow-up visits as directed by your caregiver. It is important to keep all your appointments.  Limit alcohol intake to no more than 1 drink per day for nonpregnant women and 2 drinks per day for men. Drinking more than that is harmful to your heart. Tell your caregiver if you drink alcohol several times a week. Talk with your caregiver about whether alcohol is safe for you. If your heart has already been damaged by alcohol or you have severe heart failure, drinking alcohol should be stopped completely.  Stop illicit drug use.  Stay up-to-date with immunizations. It is especially important to prevent respiratory infections through current pneumococcal and influenza immunizations.  Manage other health conditions such as hypertension, diabetes, thyroid disease, or abnormal heart rhythms as directed by your caregiver.  Learn to manage stress.  Plan rest periods when fatigued.  Learn strategies to manage high temperatures. If the weather is extremely hot:  Avoid vigorous physical activity.  Use air conditioning or fans or seek a cooler location.  Avoid caffeine and alcohol.  Wear loose-fitting, lightweight, and light-colored clothing.  Learn strategies to manage cold temperatures. If the weather is extremely cold:  Avoid vigorous physical activity.  Layer clothes.  Wear mittens or gloves, a hat, and a scarf when going outside.  Avoid alcohol.  Obtain ongoing education and support as needed.  Participate or seek rehabilitation as needed to maintain or improve independence and quality of life. SEEK MEDICAL CARE IF:   Your weight increases by 03 lb/1.4 kg in 1 day or 05 lb/2.3 kg in a week.  You have increasing shortness of breath that is unusual for you.  You are unable to participate in your usual physical activities.  You tire easily.  You cough more than normal, especially with physical activity.  You have any or  more swelling in areas such as your hands, feet, ankles, or abdomen.  You are unable to sleep because it is hard to breathe.  You feel like your heart is beating fast (palpitations).  You become dizzy or lightheaded upon standing up. SEEK IMMEDIATE MEDICAL CARE IF:   You have difficulty breathing.  There is a change in mental status such as decreased alertness or difficulty with concentration.  You have a pain or discomfort in your chest.  You have an episode of fainting (syncope). MAKE SURE YOU:   Understand these instructions.  Will watch your condition.  Will get help right away if you are not doing well or get worse. Document Released: 08/04/2005 Document Revised: 11/29/2012 Document Reviewed: 08/26/2012 [facility] Patient Information 2014 [place], [place].

## 2013-07-18 NOTE — Assessment & Plan Note (Signed)
Assessment: Patient states that she is doing well. Endorses medical compliance. Her weight is 158 lbs today, which is 7 lbs down since her discharge in Oct. 2014.   Plan: - Continue current therapy. - Check her BMP today - She states that she does not see a cardiologist as outpatient. Will send cardiology referral.

## 2013-07-19 LAB — DIGOXIN LEVEL: Digoxin Level: 0.7 ng/mL — ABNORMAL LOW (ref 0.8–2.0)

## 2013-07-22 ENCOUNTER — Encounter (HOSPITAL_BASED_OUTPATIENT_CLINIC_OR_DEPARTMENT_OTHER): Payer: PRIVATE HEALTH INSURANCE | Attending: General Surgery

## 2013-07-22 DIAGNOSIS — I87319 Chronic venous hypertension (idiopathic) with ulcer of unspecified lower extremity: Secondary | ICD-10-CM | POA: Insufficient documentation

## 2013-07-22 DIAGNOSIS — L97909 Non-pressure chronic ulcer of unspecified part of unspecified lower leg with unspecified severity: Secondary | ICD-10-CM | POA: Insufficient documentation

## 2013-07-26 NOTE — Telephone Encounter (Signed)
Pt had appt with Dr Dierdre Searles 07/18/13.

## 2013-07-28 ENCOUNTER — Other Ambulatory Visit: Payer: Self-pay | Admitting: Internal Medicine

## 2013-07-28 NOTE — Progress Notes (Signed)
Case discussed with Dr. Li soon after the resident saw the patient. We reviewed the resident's history and exam and pertinent patient test results. I agree with the assessment, diagnosis, and plan of care documented in the resident's note. 

## 2013-08-11 ENCOUNTER — Emergency Department (HOSPITAL_COMMUNITY)
Admission: EM | Admit: 2013-08-11 | Discharge: 2013-08-11 | Disposition: A | Discharge status: Home / Self Care | Payer: PRIVATE HEALTH INSURANCE | Attending: Emergency Medicine | Admitting: Emergency Medicine

## 2013-08-11 ENCOUNTER — Encounter (HOSPITAL_COMMUNITY): Payer: Self-pay | Admitting: Emergency Medicine

## 2013-08-11 DIAGNOSIS — IMO0002 Reserved for concepts with insufficient information to code with codable children: Secondary | ICD-10-CM | POA: Insufficient documentation

## 2013-08-11 DIAGNOSIS — Z7901 Long term (current) use of anticoagulants: Secondary | ICD-10-CM | POA: Insufficient documentation

## 2013-08-11 DIAGNOSIS — Z79899 Other long term (current) drug therapy: Secondary | ICD-10-CM | POA: Insufficient documentation

## 2013-08-11 DIAGNOSIS — I10 Essential (primary) hypertension: Secondary | ICD-10-CM | POA: Insufficient documentation

## 2013-08-11 DIAGNOSIS — I472 Ventricular tachycardia, unspecified: Secondary | ICD-10-CM | POA: Insufficient documentation

## 2013-08-11 DIAGNOSIS — R109 Unspecified abdominal pain: Secondary | ICD-10-CM | POA: Insufficient documentation

## 2013-08-11 DIAGNOSIS — R197 Diarrhea, unspecified: Secondary | ICD-10-CM | POA: Insufficient documentation

## 2013-08-11 DIAGNOSIS — I38 Endocarditis, valve unspecified: Secondary | ICD-10-CM | POA: Insufficient documentation

## 2013-08-11 DIAGNOSIS — I4729 Other ventricular tachycardia: Secondary | ICD-10-CM | POA: Insufficient documentation

## 2013-08-11 DIAGNOSIS — E119 Type 2 diabetes mellitus without complications: Secondary | ICD-10-CM | POA: Insufficient documentation

## 2013-08-11 DIAGNOSIS — Z8669 Personal history of other diseases of the nervous system and sense organs: Secondary | ICD-10-CM | POA: Insufficient documentation

## 2013-08-11 DIAGNOSIS — J45909 Unspecified asthma, uncomplicated: Secondary | ICD-10-CM | POA: Insufficient documentation

## 2013-08-11 DIAGNOSIS — Z8739 Personal history of other diseases of the musculoskeletal system and connective tissue: Secondary | ICD-10-CM | POA: Insufficient documentation

## 2013-08-11 DIAGNOSIS — R42 Dizziness and giddiness: Secondary | ICD-10-CM | POA: Insufficient documentation

## 2013-08-11 DIAGNOSIS — I5042 Chronic combined systolic (congestive) and diastolic (congestive) heart failure: Secondary | ICD-10-CM | POA: Insufficient documentation

## 2013-08-11 LAB — URINE MICROSCOPIC-ADD ON

## 2013-08-11 LAB — BASIC METABOLIC PANEL
BUN: 12 mg/dL (ref 6–23)
Calcium: 8.8 mg/dL (ref 8.4–10.5)
Creatinine, Ser: 0.61 mg/dL (ref 0.50–1.10)
GFR calc Af Amer: >90 mL/min (ref >90)
GFR calc non Af Amer: 80 mL/min — ABNORMAL LOW (ref >90)
Glucose, Bld: 112 mg/dL — ABNORMAL HIGH (ref 70–99)
Potassium: 3.6 mEq/L (ref 3.5–5.1)

## 2013-08-11 LAB — URINALYSIS, ROUTINE W REFLEX MICROSCOPIC
Bilirubin Urine: NEGATIVE
Glucose, UA: NEGATIVE mg/dL
Ketones, ur: NEGATIVE mg/dL
Nitrite: NEGATIVE
Protein, ur: NEGATIVE mg/dL
Specific Gravity, Urine: 1.006 (ref 1.005–1.030)
Urobilinogen, UA: 0.2 mg/dL (ref 0.0–1.0)

## 2013-08-11 LAB — CBC
HCT: 35.4 % — ABNORMAL LOW (ref 36.0–46.0)
Hemoglobin: 11.7 g/dL — ABNORMAL LOW (ref 12.0–15.0)
MCH: 30.3 pg (ref 26.0–34.0)
MCHC: 33.1 g/dL (ref 30.0–36.0)
MCV: 91.7 fL (ref 78.0–100.0)
RDW: 15.6 % — ABNORMAL HIGH (ref 11.5–15.5)

## 2013-08-11 MED ORDER — METRONIDAZOLE 500 MG PO TABS: 500.0000 mg | ORAL_TABLET | Freq: Three times a day (TID) | ORAL | Status: DC

## 2013-08-11 MED ORDER — SODIUM CHLORIDE 0.9 % IV BOLUS (SEPSIS)
1000.0000 mL | Freq: Once | INTRAVENOUS | Status: AC
  Administered 2013-08-11: 1000 mL via INTRAVENOUS

## 2013-08-11 NOTE — ED Provider Notes (Signed)
CSN: [id number]     Arrival date & time 08/11/13  0756 History   First MD Initiated Contact with Patient 08/11/13 0840     Chief Complaint  Patient presents with  . Diarrhea  . Abdominal Pain   (Consider location/radiation/quality/duration/timing/severity/associated sxs/prior Treatment) Patient is a 79 y.o. female presenting with diarrhea and abdominal pain. The history is provided by the patient.  Diarrhea Quality:  Watery Severity:  Severe Number of episodes:  Numerous daily Duration:  2 weeks Timing:  Constant Progression:  Unchanged Relieved by:  Nothing Worsened by:  Nothing tried Ineffective treatments:  Analgesics Associated symptoms: abdominal pain   Associated symptoms: no chills, no recent cough, no fever, no URI and no vomiting   Risk factors: recent antibiotic use (Levaquin for PNA)   Abdominal Pain Associated symptoms: diarrhea   Associated symptoms: no chills, no cough, no fever, no shortness of breath and no vomiting     Past Medical History  Diagnosis Date  . Hypertension   . Diabetes mellitus   . Asthma   . Osteoarthritis     left shoulder  . Pseudogout   . Hypokalemia     a. Previously felt due to diuretics, req supplementation.  . Churg-Strauss syndrome     a. Sural nerve biopsy 02/2000.  Darene Lamer disease     a. s/p thyroidectomy.  . Glaucoma   . Hypercholesteremia   . Venous insufficiency   . Osteopenia     DEXA 2004  . Lipoma     left inner thigh  . Cataract     left eye  . VASCULITIS   . Chronic combined systolic and diastolic heart failure     a. EF 25-35% 2001 req diuresis, thoracentesis. b. EF improved to 50-55% by echo 2003. c. EF 20-25% by echo 02/2012 with mild AI, mod MR/TR, PAP , sm-mod pericardial effusion --> subsequent abnormal Myoview. Treated medically. ?Tachy-mediated.  . Pleural effusion, left     a. Thoracentesis 2001 - per notes, no malignant cells, was transudative.  Marland Kitchen NSVT (nonsustained ventricular tachycardia)     a. During CHF adm 2001.  . Multifocal atrial tachycardia     a. Documented as OP 01/2012.  Marland Kitchen Ectopic atrial tachycardia     a. Documented on tele as IP 02/2012.  . Pulmonary HTN     a. Mod by echo 02/2012.  . Valvular heart disease     a. Echo 02/2012: mod MR/TR, mild AI.  Marland Kitchen Pericardial effusion     a. Echo 02/2012: small-mod pericardial effusion.   Past Surgical History  Procedure Laterality Date  . Abdominal hysterectomy    . Salpingoophorectomy    . Thyroidectomy    . Hemiarthroplasty hip Right 10/2010    cemented for hip fracture, femoral neck - by Eulas Post, MD  . Cataract extraction Right   . Hemiarthroplasty hip     Family History  Problem Relation Age of Onset  . Diabetes Mother   . Diabetes Father   . Heart disease Neg Hx    History  Substance Use Topics  . Smoking status: Never Smoker   . Smokeless tobacco: Never Used  . Alcohol Use: No   OB History   Grav Para Term Preterm Abortions TAB SAB Ect Mult Living                 Review of Systems  Constitutional: Negative for fever and chills.  Respiratory: Negative for cough and shortness of breath.   Gastrointestinal:  Positive for abdominal pain and diarrhea. Negative for vomiting.  Neurological: Positive for dizziness (occasional).  All other systems reviewed and are negative.    Allergies  Review of patient's allergies indicates no known allergies.  Home Medications   Current Outpatient Rx  Name  Route  Sig  Dispense  Refill  . albuterol (PROVENTIL HFA;VENTOLIN HFA) 108 (90 BASE) MCG/ACT inhaler   Inhalation   Inhale 1-2 puffs into the lungs every 6 (six) hours as needed for shortness of breath.   18 g   11   . brimonidine (ALPHAGAN P) 0.1 % SOLN   Left Eye   Place 1 drop into the left eye every 12 (twelve) hours.   15 mL   1   . digoxin (LANOXIN) 0.125 MG tablet   Oral   Take 1 tablet (0.125 mg total) by mouth daily.   30 tablet   3   . dorzolamide (TRUSOPT) 2 % ophthalmic solution    Left Eye   Place 1 drop into the left eye 2 (two) times daily.          . Fluticasone-Salmeterol (ADVAIR) 250-50 MCG/DOSE AEPB   Inhalation   Inhale 1 puff into the lungs 2 (two) times daily.   60 each   11   . furosemide (LASIX) 80 MG tablet   Oral   Take 80 mg by mouth 2 (two) times daily.         Marland Kitchen lisinopril (PRINIVIL,ZESTRIL) 20 MG tablet   Oral   Take 20 mg by mouth daily.         . metFORMIN (GLUCOPHAGE) 1000 MG tablet   Oral   Take 1 tablet (1,000 mg total) by mouth 2 (two) times daily with a meal.   60 tablet   2   . metoprolol succinate (TOPROL-XL) 50 MG 24 hr tablet   Oral   Take 100 mg by mouth daily.         . potassium chloride (MICRO-K) 10 MEQ CR capsule      TAKE 2 CAPSULES BY MOUTH TWICE DAILY   120 capsule   0   . Rivaroxaban (XARELTO) 15 MG TABS tablet   Oral   Take 1 tablet (15 mg total) by mouth daily with supper.   30 tablet   11    BP 183/81  Pulse 67  Temp(Src) 98.6 F (37 C) (Oral)  Resp 16  SpO2 97% Physical Exam  Nursing note and vitals reviewed. Constitutional: She is oriented to person, place, and time. She appears well-developed and well-nourished. No distress.  HENT:  Head: Normocephalic and atraumatic.  Nose: Nose normal.  Mouth/Throat: Mucous membranes are not dry.  Mucus membranes moist  Eyes: EOM are normal. Pupils are equal, round, and reactive to light.  Neck: Normal range of motion. Neck supple.  Cardiovascular: Normal rate and regular rhythm.  Exam reveals no friction rub.   No murmur heard. Pulmonary/Chest: Effort normal and breath sounds normal. No respiratory distress. She has no wheezes. She has no rales.  Abdominal: Soft. She exhibits no distension. There is no tenderness. There is no rebound.  Musculoskeletal: Normal range of motion. She exhibits no edema.  Neurological: She is alert and oriented to person, place, and time. No cranial nerve deficit. She exhibits normal muscle tone.  Skin: Skin is dry.  She is not diaphoretic.    ED Course  Procedures (including critical care time) Labs Review Labs Reviewed  CLOSTRIDIUM DIFFICILE BY PCR  CBC  BASIC  METABOLIC PANEL   Imaging Review No results found.  EKG Interpretation   None       MDM   1. Diarrhea    79 year old female presents with diarrhea. Diarrhea for the past one to 2 weeks. No blood. Recently on antibiotics, Levaquin, for pneumonia, roughly 6 weeks ago. She states every time she needs it goes right through. She denies any nausea or vomiting. She states some mild dizziness occasionally. Denies abdominal pain this time. Here she is stable vitals. She looks well hydrated. Her belly is benign her lungs are clear. We will check labs and give fluid.  Labs normal. Feeling better after fluids. Patient given flagyl for possible C Diff colitis. Instructed to f/u with PCP in 1-2 days.   Dagmar Hait, MD 08/11/13 [phone]

## 2013-08-11 NOTE — ED Notes (Signed)
Pt from home reports diarrhea x7 days. Pt states that she had PNA in November and was given abx at that time. Pt adds that every time she eats, "food just goes right through." Pt is A&O and in NAD. Pt denies N/V

## 2013-08-11 NOTE — ED Notes (Signed)
Patient is incontinent... I may need a In and out cath order to get urine

## 2013-08-15 ENCOUNTER — Other Ambulatory Visit: Payer: Self-pay | Admitting: Internal Medicine

## 2013-08-18 ENCOUNTER — Inpatient Hospital Stay (HOSPITAL_COMMUNITY)
Admission: EM | Admit: 2013-08-18 | Discharge: 2013-08-23 | DRG: 175 | Disposition: A | Discharge status: Home Health | Payer: PRIVATE HEALTH INSURANCE | Attending: Internal Medicine | Admitting: Internal Medicine

## 2013-08-18 ENCOUNTER — Emergency Department (HOSPITAL_COMMUNITY): Payer: PRIVATE HEALTH INSURANCE

## 2013-08-18 ENCOUNTER — Encounter (HOSPITAL_COMMUNITY): Payer: Self-pay | Admitting: Emergency Medicine

## 2013-08-18 DIAGNOSIS — M3130 Wegener's granulomatosis without renal involvement: Secondary | ICD-10-CM | POA: Diagnosis present

## 2013-08-18 DIAGNOSIS — I5042 Chronic combined systolic (congestive) and diastolic (congestive) heart failure: Secondary | ICD-10-CM | POA: Diagnosis present

## 2013-08-18 DIAGNOSIS — K7689 Other specified diseases of liver: Secondary | ICD-10-CM | POA: Diagnosis present

## 2013-08-18 DIAGNOSIS — E861 Hypovolemia: Secondary | ICD-10-CM | POA: Diagnosis present

## 2013-08-18 DIAGNOSIS — J45909 Unspecified asthma, uncomplicated: Secondary | ICD-10-CM | POA: Diagnosis present

## 2013-08-18 DIAGNOSIS — E0789 Other specified disorders of thyroid: Secondary | ICD-10-CM | POA: Diagnosis present

## 2013-08-18 DIAGNOSIS — I498 Other specified cardiac arrhythmias: Secondary | ICD-10-CM | POA: Diagnosis present

## 2013-08-18 DIAGNOSIS — I509 Heart failure, unspecified: Secondary | ICD-10-CM | POA: Diagnosis present

## 2013-08-18 DIAGNOSIS — Z7901 Long term (current) use of anticoagulants: Secondary | ICD-10-CM

## 2013-08-18 DIAGNOSIS — Z6825 Body mass index (BMI) 25.0-25.9, adult: Secondary | ICD-10-CM

## 2013-08-18 DIAGNOSIS — S99929A Unspecified injury of unspecified foot, initial encounter: Secondary | ICD-10-CM

## 2013-08-18 DIAGNOSIS — I482 Chronic atrial fibrillation, unspecified: Secondary | ICD-10-CM | POA: Diagnosis present

## 2013-08-18 DIAGNOSIS — Z79899 Other long term (current) drug therapy: Secondary | ICD-10-CM

## 2013-08-18 DIAGNOSIS — E876 Hypokalemia: Secondary | ICD-10-CM | POA: Diagnosis not present

## 2013-08-18 DIAGNOSIS — I4891 Unspecified atrial fibrillation: Secondary | ICD-10-CM

## 2013-08-18 DIAGNOSIS — S81801A Unspecified open wound, right lower leg, initial encounter: Secondary | ICD-10-CM

## 2013-08-18 DIAGNOSIS — I2699 Other pulmonary embolism without acute cor pulmonale: Principal | ICD-10-CM | POA: Diagnosis present

## 2013-08-18 DIAGNOSIS — I10 Essential (primary) hypertension: Secondary | ICD-10-CM | POA: Diagnosis present

## 2013-08-18 DIAGNOSIS — M1120 Other chondrocalcinosis, unspecified site: Secondary | ICD-10-CM | POA: Diagnosis present

## 2013-08-18 DIAGNOSIS — X58XXXA Exposure to other specified factors, initial encounter: Secondary | ICD-10-CM | POA: Diagnosis present

## 2013-08-18 DIAGNOSIS — I428 Other cardiomyopathies: Secondary | ICD-10-CM | POA: Diagnosis present

## 2013-08-18 DIAGNOSIS — E440 Moderate protein-calorie malnutrition: Secondary | ICD-10-CM | POA: Diagnosis present

## 2013-08-18 DIAGNOSIS — H409 Unspecified glaucoma: Secondary | ICD-10-CM | POA: Diagnosis present

## 2013-08-18 DIAGNOSIS — I2789 Other specified pulmonary heart diseases: Secondary | ICD-10-CM | POA: Diagnosis present

## 2013-08-18 DIAGNOSIS — E7800 Pure hypercholesterolemia, unspecified: Secondary | ICD-10-CM | POA: Diagnosis present

## 2013-08-18 DIAGNOSIS — I5043 Acute on chronic combined systolic (congestive) and diastolic (congestive) heart failure: Secondary | ICD-10-CM | POA: Diagnosis present

## 2013-08-18 DIAGNOSIS — S8990XA Unspecified injury of unspecified lower leg, initial encounter: Secondary | ICD-10-CM | POA: Diagnosis present

## 2013-08-18 DIAGNOSIS — E119 Type 2 diabetes mellitus without complications: Secondary | ICD-10-CM | POA: Diagnosis present

## 2013-08-18 DIAGNOSIS — D72819 Decreased white blood cell count, unspecified: Secondary | ICD-10-CM | POA: Diagnosis present

## 2013-08-18 DIAGNOSIS — S99919A Unspecified injury of unspecified ankle, initial encounter: Secondary | ICD-10-CM

## 2013-08-18 HISTORY — DX: Permanent atrial fibrillation: I48.21

## 2013-08-18 LAB — CBC WITH DIFFERENTIAL/PLATELET
Basophils Absolute: 0.1 10*3/uL (ref 0.0–0.1)
Basophils Relative: 2 % — ABNORMAL HIGH (ref 0–1)
Eosinophils Absolute: 0.3 10*3/uL (ref 0.0–0.7)
Eosinophils Relative: 10 % — ABNORMAL HIGH (ref 0–5)
HCT: 40.8 % (ref 36.0–46.0)
Hemoglobin: 13.5 g/dL (ref 12.0–15.0)
Lymphocytes Relative: 40 % (ref 12–46)
Lymphs Abs: 1.2 10*3/uL (ref 0.7–4.0)
MCH: 30.7 pg (ref 26.0–34.0)
MCHC: 33.1 g/dL (ref 30.0–36.0)
MCV: 92.7 fL (ref 78.0–100.0)
Monocytes Absolute: 0.4 10*3/uL (ref 0.1–1.0)
Monocytes Relative: 15 % — ABNORMAL HIGH (ref 3–12)
Neutro Abs: 1 10*3/uL — ABNORMAL LOW (ref 1.7–7.7)
Neutrophils Relative %: 34 % — ABNORMAL LOW (ref 43–77)
Platelets: 270 10*3/uL (ref 150–400)
RBC: 4.4 MIL/uL (ref 3.87–5.11)
RDW: 15.5 % (ref 11.5–15.5)
WBC: 2.9 10*3/uL — ABNORMAL LOW (ref 4.0–10.5)

## 2013-08-18 LAB — LIPASE, BLOOD: Lipase: 25 U/L (ref 11–59)

## 2013-08-18 LAB — PROTIME-INR
INR: 1.22 (ref 0.00–1.49)
Prothrombin Time: 15.1 seconds (ref 11.6–15.2)

## 2013-08-18 LAB — URINALYSIS, ROUTINE W REFLEX MICROSCOPIC
Bilirubin Urine: NEGATIVE
Glucose, UA: NEGATIVE mg/dL
Hgb urine dipstick: NEGATIVE
Ketones, ur: NEGATIVE mg/dL
Leukocytes, UA: NEGATIVE
Nitrite: NEGATIVE
Protein, ur: NEGATIVE mg/dL
Specific Gravity, Urine: 1.017 (ref 1.005–1.030)
Urobilinogen, UA: 0.2 mg/dL (ref 0.0–1.0)
pH: 7 (ref 5.0–8.0)

## 2013-08-18 LAB — COMPREHENSIVE METABOLIC PANEL
ALT: 5 U/L (ref 0–35)
AST: 13 U/L (ref 0–37)
Albumin: 3.4 g/dL — ABNORMAL LOW (ref 3.5–5.2)
Alkaline Phosphatase: 74 U/L (ref 39–117)
BUN: 7 mg/dL (ref 6–23)
CO2: 29 mEq/L (ref 19–32)
Calcium: 8.7 mg/dL (ref 8.4–10.5)
Chloride: 103 mEq/L (ref 96–112)
Creatinine, Ser: 0.6 mg/dL (ref 0.50–1.10)
GFR calc Af Amer: >90 mL/min (ref >90)
GFR calc non Af Amer: 81 mL/min — ABNORMAL LOW (ref >90)
Glucose, Bld: 118 mg/dL — ABNORMAL HIGH (ref 70–99)
Potassium: 4.2 mEq/L (ref 3.7–5.3)
Sodium: 141 mEq/L (ref 137–147)
Total Bilirubin: 0.7 mg/dL (ref 0.3–1.2)
Total Protein: 6.5 g/dL (ref 6.0–8.3)

## 2013-08-18 LAB — GLUCOSE, CAPILLARY: Glucose-Capillary: 131 mg/dL — ABNORMAL HIGH (ref 70–99)

## 2013-08-18 LAB — CG4 I-STAT (LACTIC ACID): LACTIC ACID, VENOUS: 1.51 mmol/L (ref 0.5–2.2)

## 2013-08-18 LAB — DIGOXIN LEVEL: Digoxin Level: 0.9 ng/mL (ref 0.8–2.0)

## 2013-08-18 MED ORDER — HEPARIN (PORCINE) IN NACL 100-0.45 UNIT/ML-% IJ SOLN
1000.0000 [IU]/h | INTRAMUSCULAR | Status: DC
  Filled 2013-08-18: qty 250

## 2013-08-18 MED ORDER — IOHEXOL 350 MG/ML SOLN
50.0000 mL | Freq: Once | INTRAVENOUS | Status: AC | PRN
  Administered 2013-08-18: 50 mL via INTRAVENOUS

## 2013-08-18 MED ORDER — IOHEXOL 300 MG/ML  SOLN
50.0000 mL | Freq: Once | INTRAMUSCULAR | Status: AC | PRN
  Administered 2013-08-18: 50 mL via ORAL

## 2013-08-18 MED ORDER — HEPARIN (PORCINE) IN NACL 100-0.45 UNIT/ML-% IJ SOLN
1250.0000 [IU]/h | INTRAMUSCULAR | Status: AC
  Administered 2013-08-18: 1000 [IU]/h via INTRAVENOUS
  Administered 2013-08-19: 1250 [IU]/h via INTRAVENOUS
  Filled 2013-08-18 (×2): qty 250

## 2013-08-18 MED ORDER — SODIUM CHLORIDE 0.9 % IV BOLUS (SEPSIS)
500.0000 mL | INTRAVENOUS | Status: AC
  Administered 2013-08-18: 500 mL via INTRAVENOUS

## 2013-08-18 MED ORDER — IOHEXOL 300 MG/ML  SOLN
100.0000 mL | Freq: Once | INTRAMUSCULAR | Status: AC | PRN
Start: 2013-08-18 — End: 2013-08-18
  Administered 2013-08-18: 100 mL via INTRAVENOUS

## 2013-08-18 NOTE — Progress Notes (Signed)
Received patient from [facility] via EMS. Patient alert and oriented x 4, IV Heparin infusing @ 1000 Units/hr via  Rt Hand PIV, site "U'. 02 @2LNP  Sat 96%  T. 97.8 oral B/P 177/87 HR 54 RR 16.Patient  settled comfortable to bed,oriented to room, call bell, telephone and night table in close reach, incontinent of urine, denies pain or discomfort. We will continue to monitor.

## 2013-08-18 NOTE — ED Notes (Signed)
Dr. Eppie Gibson approved 77ml omni 350 forCT Angiochest, cr 0.6 08-18-13, diabetic

## 2013-08-18 NOTE — ED Provider Notes (Signed)
Medical screening examination/treatment/procedure(s) were performed by non-physician practitioner and as supervising physician I was immediately available for consultation/collaboration.  Flint Melter, MD 08/18/13 2352

## 2013-08-18 NOTE — ED Provider Notes (Signed)
CSN: [id number]     Arrival date & time 08/18/13  1315 History   First MD Initiated Contact with Patient 08/18/13 1418     Chief Complaint  Patient presents with  . Diarrhea  . Weakness   (Consider location/radiation/quality/duration/timing/severity/associated sxs/prior Treatment) HPI Patient presents emergency department with generalized weakness, and diarrhea.  Patient was seen here on December 25 for similar symptoms.  She started feeling worse.  2 days, ago.  Patient, states her urine has a foul odor.  Patient denies chest pain, shortness of breath, nausea, vomiting, back pain, fever, cough, dizziness, rash, hemoptysis, or syncope.  Patient, states she did not take any medications prior to arrival.  The patient lives at home with family Past Medical History  Diagnosis Date  . Hypertension   . Diabetes mellitus   . Asthma   . Osteoarthritis     left shoulder  . Pseudogout   . Hypokalemia     a. Previously felt due to diuretics, req supplementation.  . Churg-Strauss syndrome     a. Sural nerve biopsy 02/2000.  Darene Lamer disease     a. s/p thyroidectomy.  . Glaucoma   . Hypercholesteremia   . Venous insufficiency   . Osteopenia     DEXA 2004  . Lipoma     left inner thigh  . Cataract     left eye  . VASCULITIS   . Chronic combined systolic and diastolic heart failure     a. EF 25-35% 2001 req diuresis, thoracentesis. b. EF improved to 50-55% by echo 2003. c. EF 20-25% by echo 02/2012 with mild AI, mod MR/TR, PAP , sm-mod pericardial effusion --> subsequent abnormal Myoview. Treated medically. ?Tachy-mediated.  . Pleural effusion, left     a. Thoracentesis 2001 - per notes, no malignant cells, was transudative.  Marland Kitchen NSVT (nonsustained ventricular tachycardia)     a. During CHF adm 2001.  . Multifocal atrial tachycardia     a. Documented as OP 01/2012.  Marland Kitchen Ectopic atrial tachycardia     a. Documented on tele as IP 02/2012.  . Pulmonary HTN     a. Mod by echo 02/2012.  .  Valvular heart disease     a. Echo 02/2012: mod MR/TR, mild AI.  Marland Kitchen Pericardial effusion     a. Echo 02/2012: small-mod pericardial effusion.   Past Surgical History  Procedure Laterality Date  . Abdominal hysterectomy    . Salpingoophorectomy    . Thyroidectomy    . Hemiarthroplasty hip Right 10/2010    cemented for hip fracture, femoral neck - by Eulas Post, MD  . Cataract extraction Right   . Hemiarthroplasty hip     Family History  Problem Relation Age of Onset  . Diabetes Mother   . Diabetes Father   . Heart disease Neg Hx    History  Substance Use Topics  . Smoking status: Never Smoker   . Smokeless tobacco: Never Used  . Alcohol Use: No   OB History   Grav Para Term Preterm Abortions TAB SAB Ect Mult Living                 Review of Systems All other systems negative except as documented in the HPI. All pertinent positives and negatives as reviewed in the HPI. Allergies  Review of patient's allergies indicates no known allergies.  Home Medications   Current Outpatient Rx  Name  Route  Sig  Dispense  Refill  . digoxin (LANOXIN) 0.125 MG tablet  Oral   Take 1 tablet (0.125 mg total) by mouth daily.   30 tablet   3   . dorzolamide (TRUSOPT) 2 % ophthalmic solution   Left Eye   Place 1 drop into the left eye 2 (two) times daily.          . Fluticasone-Salmeterol (ADVAIR) 250-50 MCG/DOSE AEPB   Inhalation   Inhale 1 puff into the lungs 2 (two) times daily.   60 each   11   . furosemide (LASIX) 80 MG tablet   Oral   Take 80 mg by mouth 2 (two) times daily.         . metFORMIN (GLUCOPHAGE) 1000 MG tablet   Oral   Take 1,000 mg by mouth 2 (two) times daily with a meal.         . metoprolol succinate (TOPROL-XL) 50 MG 24 hr tablet   Oral   Take 100 mg by mouth daily.         . metroNIDAZOLE (FLAGYL) 500 MG tablet   Oral   Take 1 tablet (500 mg total) by mouth 3 (three) times daily.   30 tablet   0   . potassium chloride (MICRO-K) 10  MEQ CR capsule   Oral   Take 10 mEq by mouth 2 (two) times daily.         Marland Kitchen. albuterol (PROVENTIL HFA;VENTOLIN HFA) 108 (90 BASE) MCG/ACT inhaler   Inhalation   Inhale 1-2 puffs into the lungs every 6 (six) hours as needed for shortness of breath.   18 g   11   . brimonidine (ALPHAGAN P) 0.1 % SOLN   Left Eye   Place 1 drop into the left eye every 12 (twelve) hours.   15 mL   1   . lisinopril (PRINIVIL,ZESTRIL) 20 MG tablet   Oral   Take 20 mg by mouth daily.         . Rivaroxaban (XARELTO) 15 MG TABS tablet   Oral   Take 1 tablet (15 mg total) by mouth daily with supper.   30 tablet   11    BP 176/77  Pulse 63  Temp(Src) 98.3 F (36.8 C) (Rectal)  Resp 18  SpO2 95% Physical Exam  Constitutional: She appears well-developed and well-nourished.  HENT:  Mouth/Throat: Oropharynx is clear and moist. Mucous membranes are not dry.  Eyes: Pupils are equal, round, and reactive to light.  Neck: Normal range of motion. Neck supple.  Cardiovascular: Normal rate, regular rhythm and normal heart sounds.  Exam reveals no gallop and no friction rub.   No murmur heard. Pulmonary/Chest: Effort normal and breath sounds normal. No respiratory distress. She has no wheezes.  Neurological: She is alert. She exhibits normal muscle tone. Coordination normal.  Skin: Skin is warm and dry.    ED Course  Procedures (including critical care time) Labs Review Labs Reviewed  CBC WITH DIFFERENTIAL - Abnormal; Notable for the following:    WBC 2.9 (*)    Neutrophils Relative % 34 (*)    Neutro Abs 1.0 (*)    Monocytes Relative 15 (*)    Eosinophils Relative 10 (*)    Basophils Relative 2 (*)    All other components within normal limits  COMPREHENSIVE METABOLIC PANEL - Abnormal; Notable for the following:    Glucose, Bld 118 (*)    Albumin 3.4 (*)    GFR calc non Af Amer 81 (*)    All other components within normal limits  LIPASE, BLOOD  URINALYSIS, ROUTINE W REFLEX MICROSCOPIC  CG4  I-STAT (LACTIC ACID)   Imaging Review Dg Chest 2 View  08/18/2013   CLINICAL DATA:  Cough and wheezing.  EXAM: CHEST  2 VIEW  COMPARISON:  07/02/2013.  FINDINGS: The heart is enlarged but stable. There is tortuosity, ectasia and calcification of the thoracic aorta. Low lung volumes with vascular crowding and basilar atelectasis. No obvious infiltrates or effusions. No pneumothorax. The bony thorax is grossly intact.  IMPRESSION: Stable cardiac enlargement.  No obvious infiltrates or effusions.   Electronically Signed   By: Loralie Champagne M.D.   On: 08/18/2013 15:37    EKG Interpretation    Date/Time:  Thursday August 18 2013 14:59:54 EST Ventricular Rate:  62 PR Interval:    QRS Duration: 99 QT Interval:  426 QTC Calculation: 433 R Axis:   -86 Text Interpretation:  Atrial fibrillation Inferior infarct, old Abnormal lateral Q waves Anterior infarct, old No significant change since last tracing Confirmed by [email or web address]  MD, SCOTT ([id number]) on 08/18/2013 3:07:22 PM            Patient be admitted to the hospital for weakness, and further evaluation the patient has been otherwise stable here in the emergency department    Carlyle Dolly, PA-C 08/22/13 0018

## 2013-08-18 NOTE — ED Provider Notes (Signed)
Physical Exam  BP 181/77  Pulse 56  Temp(Src) 98.8 F (37.1 C) (Oral)  Resp 18  SpO2 94%  Physical Exam  Nursing note and vitals reviewed. Constitutional: She is oriented to person, place, and time. She appears well-developed and well-nourished. No distress.  HENT:  Head: Normocephalic and atraumatic.  Right Ear: External ear normal.  Left Ear: External ear normal.  Nose: Nose normal.  Mouth/Throat: Oropharynx is clear and moist.  Eyes: Conjunctivae are normal.  Neck: Normal range of motion.  Cardiovascular: Normal rate, regular rhythm and normal heart sounds.   Pulmonary/Chest: Effort normal. No stridor. No respiratory distress.  Abdominal: Soft. She exhibits no distension.  Musculoskeletal: Normal range of motion.  Neurological: She is alert and oriented to person, place, and time. She has normal strength.  Skin: Skin is warm and dry. She is not diaphoretic. No erythema.  Psychiatric: She has a normal mood and affect. Her behavior is normal.   Results for orders placed during the hospital encounter of 08/18/13  CBC WITH DIFFERENTIAL      Result Value Range   WBC 2.9 (*) 4.0 - 10.5 K/uL   RBC 4.40  3.87 - 5.11 MIL/uL   Hemoglobin 13.5  12.0 - 15.0 g/dL   HCT 16.1  09.6 - 04.5 %   MCV 92.7  78.0 - 100.0 fL   MCH 30.7  26.0 - 34.0 pg   MCHC 33.1  30.0 - 36.0 g/dL   RDW 40.9  81.1 - 91.4 %   Platelets 270  150 - 400 K/uL   Neutrophils Relative % 34 (*) 43 - 77 %   Neutro Abs 1.0 (*) 1.7 - 7.7 K/uL   Lymphocytes Relative 40  12 - 46 %   Lymphs Abs 1.2  0.7 - 4.0 K/uL   Monocytes Relative 15 (*) 3 - 12 %   Monocytes Absolute 0.4  0.1 - 1.0 K/uL   Eosinophils Relative 10 (*) 0 - 5 %   Eosinophils Absolute 0.3  0.0 - 0.7 K/uL   Basophils Relative 2 (*) 0 - 1 %   Basophils Absolute 0.1  0.0 - 0.1 K/uL  COMPREHENSIVE METABOLIC PANEL      Result Value Range   Sodium 141  137 - 147 mEq/L   Potassium 4.2  3.7 - 5.3 mEq/L   Chloride 103  96 - 112 mEq/L   CO2 29  19 - 32  mEq/L   Glucose, Bld 118 (*) 70 - 99 mg/dL   BUN 7  6 - 23 mg/dL   Creatinine, Ser 7.82  0.50 - 1.10 mg/dL   Calcium 8.7  8.4 - 95.6 mg/dL   Total Protein 6.5  6.0 - 8.3 g/dL   Albumin 3.4 (*) 3.5 - 5.2 g/dL   AST 13  0 - 37 U/L   ALT 5  0 - 35 U/L   Alkaline Phosphatase 74  39 - 117 U/L   Total Bilirubin 0.7  0.3 - 1.2 mg/dL   GFR calc non Af Amer 81 (*) >90 mL/min   GFR calc Af Amer >90  >90 mL/min  URINALYSIS, ROUTINE W REFLEX MICROSCOPIC      Result Value Range   Color, Urine YELLOW  YELLOW   APPearance CLEAR  CLEAR   Specific Gravity, Urine 1.017  1.005 - 1.030   pH 7.0  5.0 - 8.0   Glucose, UA NEGATIVE  NEGATIVE mg/dL   Hgb urine dipstick NEGATIVE  NEGATIVE   Bilirubin Urine NEGATIVE  NEGATIVE   Ketones, ur NEGATIVE  NEGATIVE mg/dL   Protein, ur NEGATIVE  NEGATIVE mg/dL   Urobilinogen, UA 0.2  0.0 - 1.0 mg/dL   Nitrite NEGATIVE  NEGATIVE   Leukocytes, UA NEGATIVE  NEGATIVE  LIPASE, BLOOD      Result Value Range   Lipase 25  11 - 59 U/L  DIGOXIN LEVEL      Result Value Range   Digoxin Level 0.9  0.8 - 2.0 ng/mL  PROTIME-INR      Result Value Range   Prothrombin Time 15.1  11.6 - 15.2 seconds   INR 1.22  0.00 - 1.49  CG4 I-STAT (LACTIC ACID)      Result Value Range   Lactic Acid, Venous 1.51  0.5 - 2.2 mmol/L    Dg Chest 2 View  08/18/2013   CLINICAL DATA:  Cough and wheezing.  EXAM: CHEST  2 VIEW  COMPARISON:  07/02/2013.  FINDINGS: The heart is enlarged but stable. There is tortuosity, ectasia and calcification of the thoracic aorta. Low lung volumes with vascular crowding and basilar atelectasis. No obvious infiltrates or effusions. No pneumothorax. The bony thorax is grossly intact.  IMPRESSION: Stable cardiac enlargement.  No obvious infiltrates or effusions.   Electronically Signed   By: Loralie Champagne M.D.   On: 08/18/2013 15:37   Ct Head Wo Contrast  08/18/2013   CLINICAL DATA:  Weakness, decreased appetite  EXAM: CT HEAD WITHOUT CONTRAST  TECHNIQUE:  Contiguous axial images were obtained from the base of the skull through the vertex without intravenous contrast.  COMPARISON:  10/25/2010  FINDINGS: Moderate age-related atrophy. Moderate to severe low attenuation in the deep white matter. No evidence of acute infarct. No hemorrhage or extra-axial fluid. No hydrocephalus. Mild inflammatory change in the sinuses, much less prominent when compared to the prior study.  IMPRESSION: Chronic involutional change.  No acute findings.   Electronically Signed   By: Esperanza Heir M.D.   On: 08/18/2013 16:10   Ct Angio Chest Pe W/cm &/or Wo Cm  08/18/2013   CLINICAL DATA:  Diarrhea and weakness. Decreased appetite. Abnormal appearance of the left lower lobe on recent CT of the abdomen and pelvis. Evaluate for potential pulmonary embolism.  EXAM: CT ANGIOGRAPHY CHEST WITH CONTRAST  TECHNIQUE: Multidetector CT imaging of the chest was performed using the standard protocol during bolus administration of intravenous contrast. Multiplanar CT image reconstructions including MIPs were obtained to evaluate the vascular anatomy.  CONTRAST:  71mL OMNIPAQUE IOHEXOL 350 MG/ML SOLN  COMPARISON:  CT of the abdomen and pelvis 08/18/2013.  FINDINGS: Mediastinum: Study is severely limited by a large amount of patient respiratory motion. Despite these limitations, there are are filling defects in the distal left lower lobe pulmonary artery, extending into all basal segmental and subsegmental sized branches, which appear completely occlusive, compatible with pulmonary embolism. No additional filling defects are otherwise noted. However, there is massive dilatation of the pulmonic trunk (5 cm in diameter) and the main pulmonary arteries, indicative of pulmonary arterial hypertension. Severe cardiomegaly is noted. There is no significant pericardial fluid, thickening or pericardial calcification. There is atherosclerosis of the thoracic aorta, the great vessels of the mediastinum and the  coronary arteries, including calcified atherosclerotic plaque in the left main, left anterior descending and right coronary arteries. No pathologically enlarged mediastinal or hilar lymph nodes. Marked enlargement and heterogeneous appearance of the left lobe of the thyroid gland which measures up to 5.0 x 4.2 cm and has multiple internal coarse  calcifications.  Lungs/Pleura: Despite the extensive occlusive embolus in the left lower lobe, there is no consolidative airspace disease to suggest hemorrhage from pulmonary infarction at this time. Mild diffuse bronchial wall thickening. No pleural effusions. Multiple areas of linear scarring are noted, most pronounced in the medial segment of the right middle lobe and inferior segment of the lingula.  Upper Abdomen: See separate dictation for CT of the abdomen and pelvis 08/18/2013.  Musculoskeletal: There are no aggressive appearing lytic or blastic lesions noted in the visualized portions of the skeleton.  Review of the MIP images confirms the above findings.  IMPRESSION: 1. Study is positive for occlusive embolus in the distal left lower lobe pulmonary artery extending into all basal segmental and subsegmental sized branches. No definite hemorrhage to suggest pulmonary infarction at this time. 2. Massively dilated pulmonic trunk and pulmonary arteries, indicative of underlying pulmonary arterial hypertension. 3. Severe cardiomegaly. 4. Atherosclerosis, including left main and 2 vessel coronary artery disease. 5. Markedly enlarged and heterogeneous appearing partially calcified masslike appearance of the left lobe of the thyroid gland. While this may simply represent a goiter, clinical correlation is recommended, and consideration for followup nonemergent thyroid ultrasound may be warranted if there is clinical concern for thyroid disease. 6. Additional incidental findings, as above.   Electronically Signed   By: Trudie Reedaniel  Entrikin M.D.   On: 08/18/2013 17:29   Ct  Abdomen Pelvis W Contrast  08/18/2013   CLINICAL DATA:  Diarrhea and weakness. Reduced appetite. Hypertension. Diabetes.  EXAM: CT ABDOMEN AND PELVIS WITH CONTRAST  TECHNIQUE: Multidetector CT imaging of the abdomen and pelvis was performed using the standard protocol following bolus administration of intravenous contrast.  CONTRAST:  100mL OMNIPAQUE IOHEXOL 300 MG/ML  SOLN  COMPARISON:  None.  FINDINGS: Prominent 4 chamber cardiomegaly. Abnormal could low-density linear branching structure in the left lower lobe, 0 representing either bronchiectasis with mucoid impaction in the visualize left lower lobe or pulmonary embolus. There is asymmetric reduced vascularity in the left lower lobe which might favor embolus. Right ventricular to left ventricular diameter ratio 1.0.  Diffuse hepatic steatosis with scattered hypodense lesions in the liver measuring up to 1.3 cm in diameter. Some of these may represent cysts but summer nonspecific  Spleen, pancreas, and adrenal glands normal. Hypoenhancement of the kidneys noted on initial arterial phase images, potentially from the renal artery atherosclerosis. The kidneys do symmetrically enhance on the delayed phase images.  Mild left greater than right hydroureter extending to the iliac vessel cross over.  Prominent stool throughout the colon favors constipation. Mild sigmoid colon redundancy. Aortoiliac atherosclerosis.  Right hemipelvis partially obscured by right hip implant. Oral contrast does not made its way to the distal small bowel up the time of imaging, in the appendix is not confidently seen. There is chondrocalcinosis at the pubic symphysis.  Moderate dextroconvex lumbar scoliosis with spondylosis and degenerative disc disease at all lumbar levels. There is mild grade 1 anterolisthesis at L5-S1 without pars defects.  There is chondrocalcinosis of the acetabular labrum.  IMPRESSION: 1. Branching soft tissue density in the left lower lobe could represent pulmonary  embolus or a plugged bronchiectatic left lower lobe bronchus. Consider CT angiography of the chest. 2. Prominent cardiomegaly, with the right ventricle larger than the left. If there is pulmonary embolus, this could indicate right heart strain. 3. Diffuse hepatic steatosis. 4. Scattered hypodense liver lesions. These were noted on the report from the prior exam from 2001, but those images are not available and  I am not able to directly compare to see if any of these are new or changed. Accordingly, malignancy is not excluded. These could be further worked up with hepatic protocol MRI, if clinically warranted. 5. Mild hypo enhancement of the kidneys on arterial phase images, possibly due to renal artery atherosclerosis. 6. Mild left greater than right hydroureter proximally, extending to the iliac vessel cross over. No visible renal calculus. 7. Nonvisualization of the appendix. 8. 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 9. Moderate dextroconvex lumbar scoliosis with spondylosis and degenerative disc disease at all lumbar levels.   Electronically Signed   By: Herbie Baltimore M.D.   On: 08/18/2013 16:21     ED Course  Procedures  MDM Pt signed out to me by Lawyer, PA-C at change of shift. Pt with 2 weeks of generalized weakness. Pt lives at home alone and family stay with her at night. Will likely need admission. UA and dig level pending.   Pt found to have PE on CTA of chest. Will admit to internal medicine teaching service for PE management. Heparin started. Last dose of Xarelto was approximately 24 hours ago. Vital signs stable at this point.       Mora Bellman, PA-C 08/18/13 1912

## 2013-08-18 NOTE — ED Notes (Addendum)
Per EMS: Pt reports having diarrhea and weakness, of which she was seen for the same on 08/11/13. Pt reports she initially felt better after discharge, however began to feel worse two days ago. Pt reports being incontinent of urine and having a bad smell. Pt denies nausea or vomitting, however reports a decreased appetite.  Pt reports a history of hypertension, however the home health nurse reports the patient has been non-compliant with medications.

## 2013-08-18 NOTE — ED Notes (Signed)
Bed: PJ82 Expected date:  Expected time:  Means of arrival:  Comments: EMS-Diarrhea/weak/htn

## 2013-08-18 NOTE — Progress Notes (Signed)
ANTICOAGULATION CONSULT NOTE - Initial Consult  Pharmacy Consult for IV heparin Indication: pulmonary embolus  No Known Allergies  Patient Measurements:   Wt 71.7kg (07/18/13), Ht 163cm  Vital Signs: Temp: 98.8 F (37.1 C) (01/01 1743) Temp src: Oral (01/01 1743) BP: 181/77 mmHg (01/01 1743) Pulse Rate: 56 (01/01 1743)  Labs:  Recent Labs  08/18/13 1444  HGB 13.5  HCT 40.8  PLT 270  CREATININE 0.60    The CrCl is unknown because both a height and weight (above a minimum accepted value) are required for this calculation.   Medical History: Past Medical History  Diagnosis Date  . Hypertension   . Diabetes mellitus   . Asthma   . Osteoarthritis     left shoulder  . Pseudogout   . Hypokalemia     a. Previously felt due to diuretics, req supplementation.  . Churg-Strauss syndrome     a. Sural nerve biopsy 02/2000.  Darene Lamer disease     a. s/p thyroidectomy.  . Glaucoma   . Hypercholesteremia   . Venous insufficiency   . Osteopenia     DEXA 2004  . Lipoma     left inner thigh  . Cataract     left eye  . VASCULITIS   . Chronic combined systolic and diastolic heart failure     a. EF 25-35% 2001 req diuresis, thoracentesis. b. EF improved to 50-55% by echo 2003. c. EF 20-25% by echo 02/2012 with mild AI, mod MR/TR, PAP , sm-mod pericardial effusion --> subsequent abnormal Myoview. Treated medically. ?Tachy-mediated.  . Pleural effusion, left     a. Thoracentesis 2001 - per notes, no malignant cells, was transudative.  Marland Kitchen NSVT (nonsustained ventricular tachycardia)     a. During CHF adm 2001.  . Multifocal atrial tachycardia     a. Documented as OP 01/2012.  Marland Kitchen Ectopic atrial tachycardia     a. Documented on tele as IP 02/2012.  . Pulmonary HTN     a. Mod by echo 02/2012.  . Valvular heart disease     a. Echo 02/2012: mod MR/TR, mild AI.  Marland Kitchen Pericardial effusion     a. Echo 02/2012: small-mod pericardial effusion.    Medications:  Scheduled:  Infusions:    Assessment: 80 yo female presented to ER with diarrhea and weakness found to have a PE per CT scan of chest. Note patient on Xarelto 15mg  once daily with supper PTA for Afib, so last dose would have been yesterday evening at the earliest. Baseline CBC obtained.  Goal of Therapy:  Heparin level 0.3-0.7 units/ml Monitor platelets by anticoagulation protocol: Yes   Plan:  1) NO IV heparin bolus due to being on Xarelto PTA 2) IV heparin 1000 units/hr 3) 8 hr heparin level after start  4) daily CBC and heparin level   Hessie Knows, PharmD, BCPS Pager [phone] 08/18/2013 5:58 PM

## 2013-08-18 NOTE — ED Provider Notes (Signed)
Medical screening examination/treatment/procedure(s) were conducted as a shared visit with non-physician practitioner(s) and myself.  I personally evaluated the patient during the encounter.  EKG Interpretation   None        The patient with generalized weakness some abdominal pain some loose bowel movements since Christmas feeling worse. Patient is home by herself during the day and has family members with her during the night. Patient's activity is been decreased significantly in the past couple weeks. Would recommend a broad evaluation to see if anything can be found to explain her weakness and her abdominal pain. Would recommend head CT, abdominal CT and broad lab workup. Including C. Difficile.    Shelda Jakes, MD 08/18/13 [phone]

## 2013-08-19 ENCOUNTER — Encounter (HOSPITAL_COMMUNITY): Payer: Self-pay | Admitting: Physician Assistant

## 2013-08-19 ENCOUNTER — Encounter (HOSPITAL_BASED_OUTPATIENT_CLINIC_OR_DEPARTMENT_OTHER): Payer: PRIVATE HEALTH INSURANCE | Attending: General Surgery

## 2013-08-19 DIAGNOSIS — E43 Unspecified severe protein-calorie malnutrition: Secondary | ICD-10-CM | POA: Insufficient documentation

## 2013-08-19 DIAGNOSIS — I5042 Chronic combined systolic (congestive) and diastolic (congestive) heart failure: Secondary | ICD-10-CM | POA: Insufficient documentation

## 2013-08-19 DIAGNOSIS — E440 Moderate protein-calorie malnutrition: Secondary | ICD-10-CM

## 2013-08-19 DIAGNOSIS — S81809A Unspecified open wound, unspecified lower leg, initial encounter: Secondary | ICD-10-CM

## 2013-08-19 DIAGNOSIS — I5022 Chronic systolic (congestive) heart failure: Secondary | ICD-10-CM

## 2013-08-19 DIAGNOSIS — S81009A Unspecified open wound, unspecified knee, initial encounter: Secondary | ICD-10-CM

## 2013-08-19 DIAGNOSIS — I4891 Unspecified atrial fibrillation: Secondary | ICD-10-CM

## 2013-08-19 DIAGNOSIS — I5030 Unspecified diastolic (congestive) heart failure: Secondary | ICD-10-CM | POA: Insufficient documentation

## 2013-08-19 DIAGNOSIS — S91009A Unspecified open wound, unspecified ankle, initial encounter: Secondary | ICD-10-CM

## 2013-08-19 DIAGNOSIS — I2699 Other pulmonary embolism without acute cor pulmonale: Principal | ICD-10-CM

## 2013-08-19 DIAGNOSIS — I10 Essential (primary) hypertension: Secondary | ICD-10-CM

## 2013-08-19 DIAGNOSIS — I369 Nonrheumatic tricuspid valve disorder, unspecified: Secondary | ICD-10-CM

## 2013-08-19 DIAGNOSIS — E119 Type 2 diabetes mellitus without complications: Secondary | ICD-10-CM

## 2013-08-19 LAB — HEPARIN LEVEL (UNFRACTIONATED): Heparin Unfractionated: 0.11 IU/mL — ABNORMAL LOW (ref 0.30–0.70)

## 2013-08-19 LAB — BLOOD GAS, ARTERIAL
ACID-BASE EXCESS: 5.4 mmol/L — AB (ref 0.0–2.0)
Bicarbonate: 30.7 mEq/L — ABNORMAL HIGH (ref 20.0–24.0)
Drawn by: 270271
FIO2: 0.28 %
O2 SAT: 96.9 %
Patient temperature: 98.6
TCO2: 32.4 mmol/L (ref 0–100)
pCO2 arterial: 56.5 mmHg — ABNORMAL HIGH (ref 35.0–45.0)
pH, Arterial: 7.354 (ref 7.350–7.450)
pO2, Arterial: 95.1 mmHg (ref 80.0–100.0)

## 2013-08-19 LAB — TROPONIN I: Troponin I: <0.3 ng/mL (ref <0.30)

## 2013-08-19 LAB — BASIC METABOLIC PANEL
BUN: 7 mg/dL (ref 6–23)
CALCIUM: 8.4 mg/dL (ref 8.4–10.5)
CO2: 25 meq/L (ref 19–32)
Chloride: 103 mEq/L (ref 96–112)
Creatinine, Ser: 0.61 mg/dL (ref 0.50–1.10)
GFR calc Af Amer: >90 mL/min (ref >90)
GFR, EST NON AFRICAN AMERICAN: 80 mL/min — AB (ref >90)
GLUCOSE: 97 mg/dL (ref 70–99)
Potassium: 4.5 mEq/L (ref 3.7–5.3)
Sodium: 142 mEq/L (ref 137–147)

## 2013-08-19 LAB — GLUCOSE, CAPILLARY
GLUCOSE-CAPILLARY: 100 mg/dL — AB (ref 70–99)
GLUCOSE-CAPILLARY: 127 mg/dL — AB (ref 70–99)
Glucose-Capillary: 103 mg/dL — ABNORMAL HIGH (ref 70–99)
Glucose-Capillary: 81 mg/dL (ref 70–99)
Glucose-Capillary: 204 mg/dL — ABNORMAL HIGH (ref 70–99)

## 2013-08-19 LAB — APTT: aPTT: 51 seconds — ABNORMAL HIGH (ref 24–37)

## 2013-08-19 LAB — CBC
HCT: 41.2 % (ref 36.0–46.0)
Hemoglobin: 13.2 g/dL (ref 12.0–15.0)
MCH: 30.6 pg (ref 26.0–34.0)
MCHC: 32 g/dL (ref 30.0–36.0)
MCV: 95.4 fL (ref 78.0–100.0)
PLATELETS: 241 10*3/uL (ref 150–400)
RBC: 4.32 MIL/uL (ref 3.87–5.11)
RDW: 15.6 % — ABNORMAL HIGH (ref 11.5–15.5)
WBC: 3.3 10*3/uL — ABNORMAL LOW (ref 4.0–10.5)

## 2013-08-19 LAB — TSH: TSH: 1.563 u[IU]/mL (ref 0.350–4.500)

## 2013-08-19 LAB — MRSA PCR SCREENING: MRSA by PCR: NEGATIVE

## 2013-08-19 MED ORDER — SODIUM CHLORIDE 0.9 % IV SOLN
250.0000 mL | INTRAVENOUS | Status: DC | PRN
  Administered 2013-08-19: 250 mL via INTRAVENOUS

## 2013-08-19 MED ORDER — ONDANSETRON HCL 4 MG/2ML IJ SOLN: 4.0000 mg | Freq: Four times a day (QID) | INTRAMUSCULAR | Status: DC | PRN

## 2013-08-19 MED ORDER — INSULIN ASPART 100 UNIT/ML ~~LOC~~ SOLN
0.0000 [IU] | SUBCUTANEOUS | Status: DC
  Administered 2013-08-19: 3 [IU] via SUBCUTANEOUS
  Administered 2013-08-19: 1 [IU] via SUBCUTANEOUS

## 2013-08-19 MED ORDER — MOMETASONE FURO-FORMOTEROL FUM 100-5 MCG/ACT IN AERO
2.0000 | INHALATION_SPRAY | Freq: Two times a day (BID) | RESPIRATORY_TRACT | Status: DC
  Administered 2013-08-19 – 2013-08-23 (×7): 2 via RESPIRATORY_TRACT
  Filled 2013-08-19: qty 8.8

## 2013-08-19 MED ORDER — LOSARTAN POTASSIUM 50 MG PO TABS
50.0000 mg | ORAL_TABLET | Freq: Every day | ORAL | Status: DC
Start: 2013-08-19 — End: 2013-08-20
  Administered 2013-08-19: 50 mg via ORAL
  Filled 2013-08-19 (×2): qty 1

## 2013-08-19 MED ORDER — BRIMONIDINE TARTRATE 0.2 % OP SOLN
1.0000 [drp] | Freq: Two times a day (BID) | OPHTHALMIC | Status: DC
  Administered 2013-08-19 – 2013-08-23 (×8): 1 [drp] via OPHTHALMIC
  Filled 2013-08-19: qty 5

## 2013-08-19 MED ORDER — HEPARIN BOLUS VIA INFUSION
2000.0000 [IU] | Freq: Once | INTRAVENOUS | Status: AC
  Administered 2013-08-19: 2000 [IU] via INTRAVENOUS
  Filled 2013-08-19: qty 2000

## 2013-08-19 MED ORDER — BRIMONIDINE TARTRATE 0.15 % OP SOLN
1.0000 [drp] | Freq: Three times a day (TID) | OPHTHALMIC | Status: DC
  Filled 2013-08-19 (×2): qty 5

## 2013-08-19 MED ORDER — ALBUTEROL SULFATE (2.5 MG/3ML) 0.083% IN NEBU
2.5000 mg | INHALATION_SOLUTION | Freq: Four times a day (QID) | RESPIRATORY_TRACT | Status: DC
  Administered 2013-08-19: 2.5 mg via RESPIRATORY_TRACT
  Filled 2013-08-19 (×5): qty 3

## 2013-08-19 MED ORDER — SODIUM CHLORIDE 0.9 % IJ SOLN
3.0000 mL | Freq: Two times a day (BID) | INTRAMUSCULAR | Status: DC
Start: 2013-08-19 — End: 2013-08-19
  Administered 2013-08-19 (×2): 3 mL via INTRAVENOUS

## 2013-08-19 MED ORDER — HYDRALAZINE HCL 20 MG/ML IJ SOLN
10.0000 mg | Freq: Four times a day (QID) | INTRAMUSCULAR | Status: DC | PRN
Start: 2013-08-19 — End: 2013-08-21
  Administered 2013-08-19: 10 mg via INTRAVENOUS
  Filled 2013-08-19 (×2): qty 1

## 2013-08-19 MED ORDER — GLUCERNA SHAKE PO LIQD
237.0000 mL | ORAL | Status: DC
  Administered 2013-08-20 – 2013-08-22 (×2): 237 mL via ORAL

## 2013-08-19 MED ORDER — DIGOXIN 125 MCG PO TABS: 0.1250 mg | ORAL_TABLET | Freq: Every day | ORAL | Status: DC

## 2013-08-19 MED ORDER — ONDANSETRON HCL 4 MG PO TABS: 4.0000 mg | ORAL_TABLET | Freq: Four times a day (QID) | ORAL | Status: DC | PRN

## 2013-08-19 MED ORDER — BRIMONIDINE TARTRATE 0.2 % OP SOLN
1.0000 [drp] | Freq: Three times a day (TID) | OPHTHALMIC | Status: DC
  Administered 2013-08-19: 1 [drp] via OPHTHALMIC
  Filled 2013-08-19: qty 5

## 2013-08-19 MED ORDER — INSULIN ASPART 100 UNIT/ML ~~LOC~~ SOLN
0.0000 [IU] | Freq: Three times a day (TID) | SUBCUTANEOUS | Status: DC
  Administered 2013-08-20: 18:00:00 2 [IU] via SUBCUTANEOUS
  Administered 2013-08-20: 12:00:00 1 [IU] via SUBCUTANEOUS
  Administered 2013-08-21 (×2): 2 [IU] via SUBCUTANEOUS
  Administered 2013-08-22 (×2): 1 [IU] via SUBCUTANEOUS
  Administered 2013-08-22: 12:00:00 2 [IU] via SUBCUTANEOUS
  Administered 2013-08-23: 07:00:00 3 [IU] via SUBCUTANEOUS
  Administered 2013-08-23: 13:00:00 2 [IU] via SUBCUTANEOUS

## 2013-08-19 MED ORDER — RIVAROXABAN 15 MG PO TABS
15.0000 mg | ORAL_TABLET | Freq: Two times a day (BID) | ORAL | Status: DC
  Administered 2013-08-19 – 2013-08-23 (×10): 15 mg via ORAL
  Filled 2013-08-19 (×10): qty 1

## 2013-08-19 MED ORDER — SODIUM CHLORIDE 0.9 % IJ SOLN: 3.0000 mL | INTRAMUSCULAR | Status: DC | PRN

## 2013-08-19 MED ORDER — DORZOLAMIDE HCL 2 % OP SOLN
1.0000 [drp] | Freq: Two times a day (BID) | OPHTHALMIC | Status: DC
  Administered 2013-08-19 – 2013-08-23 (×10): 1 [drp] via OPHTHALMIC
  Filled 2013-08-19: qty 10

## 2013-08-19 MED ORDER — ALBUTEROL SULFATE HFA 108 (90 BASE) MCG/ACT IN AERS
1.0000 | INHALATION_SPRAY | Freq: Four times a day (QID) | RESPIRATORY_TRACT | Status: DC | PRN
  Filled 2013-08-19: qty 6.7

## 2013-08-19 MED ORDER — SODIUM CHLORIDE 0.9 % IJ SOLN
3.0000 mL | Freq: Two times a day (BID) | INTRAMUSCULAR | Status: DC
  Administered 2013-08-19 – 2013-08-23 (×7): 3 mL via INTRAVENOUS

## 2013-08-19 MED ORDER — RIVAROXABAN 20 MG PO TABS: 20.0000 mg | ORAL_TABLET | Freq: Every day | ORAL | Status: DC

## 2013-08-19 NOTE — Progress Notes (Signed)
Pt transferred to the unit from 2W MD paged about bradycardia. MD came to bedside no interventions ordered. Pt is A&O and I will continue to assess and monitor.

## 2013-08-19 NOTE — Progress Notes (Signed)
INITIAL NUTRITION ASSESSMENT  DOCUMENTATION CODES Per approved criteria  -Severe malnutrition in the context of chronic illness   INTERVENTION: - Glucerna shakes daily, 8 oz provides 220 kcal, 10 g protein - Encourage adequate intake  NUTRITION DIAGNOSIS: Unintentional weight loss related to decreased appetite as evidenced by 13% weight loss in 3 months.   Goal: Patient will meet >/=90% of estimated nutrition needs  Monitor:  PO intake, weight, labs  Reason for Assessment: Malnutrition screening tool  80 y.o. female  Admitting Dx: Pulmonary embolism  ASSESSMENT: Patient with history of HTN, diabetes, Atrial fib, CHF admitted with weakness, abdominal pain, and diarrhea. [name] reports that she had no appetite for the last 3 days due to weakness. Intake has been fair since October 2014 and has lost 21 pounds in the last 3 months (13% of her UBW). Intake is good since admission with 100% intake of meals.   Patient meets the criteria for severe MALNUTRITION in the context of chronic illness with 13% weight loss in 3 months and PO intake <75% of estimated needs.    Height: Ht Readings from Last 1 Encounters:  08/19/13 5\' 5"  (1.651 m)    Weight: Wt Readings from Last 1 Encounters:  08/19/13 144 lb 13.5 oz (65.7 kg)    Ideal Body Weight: 125 pounds  % Ideal Body Weight: 115%  Wt Readings from Last 10 Encounters:  08/19/13 144 lb 13.5 oz (65.7 kg)  07/18/13 158 lb 1.6 oz (71.714 kg)  05/26/13 166 lb 12.8 oz (75.66 kg)  05/19/13 165 lb 2 oz (74.9 kg)  05/02/13 184 lb 6.4 oz (83.643 kg)  03/16/13 182 lb 6.4 oz (82.736 kg)  12/13/12 165 lb (74.844 kg)  11/29/12 166 lb (75.297 kg)  11/02/12 168 lb 8 oz (76.431 kg)  10/11/12 183 lb 4.8 oz (83.144 kg)    Usual Body Weight: 165 pounds  % Usual Body Weight: 87%  BMI:  Body mass index is 24.1 kg/(m^2). Patient is normal weight.   Estimated Nutritional Needs: Kcal: 1350-1450 kcal Protein: 65-80 g Fluid: >1.9  L/day  Skin: Wound right leg  Diet Order: Renal 60/70  EDUCATION NEEDS: -No education needs identified at this time   Intake/Output Summary (Last 24 hours) at 08/19/13 1422 Last data filed at 08/19/13 1200  Gross per 24 hour  Intake 730.16 ml  Output      0 ml  Net 730.16 ml    Last BM: PTA   Labs:   Recent Labs Lab 08/18/13 1444 08/19/13 0545  NA 141 142  K 4.2 4.5  CL 103 103  CO2 29 25  BUN 7 7  CREATININE 0.60 0.61  CALCIUM 8.7 8.4  GLUCOSE 118* 97    CBG (last 3)   Recent Labs  08/19/13 0354 08/19/13 0814 08/19/13 1220  GLUCAP 103* 81 204*    Scheduled Meds: . brimonidine  1 drop Both Eyes TID  . dorzolamide  1 drop Left Eye BID  . insulin aspart  0-9 Units Subcutaneous Q4H  . losartan  50 mg Oral Daily  . mometasone-formoterol  2 puff Inhalation BID  . rivaroxaban  15 mg Oral BID   Followed by  . [START ON 09/09/2013] rivaroxaban  20 mg Oral Daily  . sodium chloride  3 mL Intravenous Q12H    Continuous Infusions:   Past Medical History  Diagnosis Date  . Hypertension   . Diabetes mellitus   . Asthma   . Osteoarthritis     left shoulder  .  Pseudogout   . Hypokalemia     a. Previously felt due to diuretics, req supplementation.  . Churg-Strauss syndrome     a. Sural nerve biopsy 02/2000.  Darene Lamer. Grave's disease     a. s/p thyroidectomy.  . Glaucoma   . Hypercholesteremia   . Venous insufficiency   . Osteopenia     DEXA 2004  . Lipoma     left inner thigh  . Cataract     left eye  . VASCULITIS   . Chronic combined systolic and diastolic heart failure     a. 7/82957/2013 EF:20-25%  . Pleural effusion, left     a. Thoracentesis 2001 - per notes, no malignant cells, was transudative.  Marland Kitchen. NSVT (nonsustained ventricular tachycardia)     a. During CHF adm 2001.  . Multifocal atrial tachycardia     a. Documented as OP 01/2012.  Marland Kitchen. Ectopic atrial tachycardia     a. Documented on tele as IP 02/2012.  . Pulmonary HTN     a. Mod by echo 02/2012.   . Valvular heart disease     a. Echo 02/2012: mod MR/TR, mild AI.  Marland Kitchen. Pericardial effusion     a. Echo 02/2012: small-mod pericardial effusion.  . Atrial fibrillation, permanent     a. Dx 04/2013, on metoprolol, dig and xarelto    Past Surgical History  Procedure Laterality Date  . Abdominal hysterectomy    . Salpingoophorectomy    . Thyroidectomy    . Hemiarthroplasty hip Right 10/2010    cemented for hip fracture, femoral neck - by Eulas PostJoshua P Landau, MD  . Cataract extraction Right   . Hemiarthroplasty hip      Linnell FullingElyse Landyn Buckalew, RD, LDN Pager #: [phone] After-Hours Pager #: [phone]

## 2013-08-19 NOTE — Progress Notes (Deleted)
    10:08 PM  Pt was lying in bed comfortably in no distress.  She denied any SOB or CP.    Filed Vitals:   08/19/13 2038  BP: 133/61  Pulse: 72  Temp: 98 F (36.7 C)  Resp: 18     Problem List Items Addressed This Visit   Severe protein-calorie malnutrition (Chronic)   *Pulmonary embolism - Primary   Relevant Medications      furosemide (LASIX) 80 MG tablet      heparin bolus via infusion 2,000 Units (Completed)      hydrALAZINE (APRESOLINE) injection 10 mg      losartan (COZAAR) tablet 50 mg      Rivaroxaban (XARELTO) tablet 15 mg      Rivaroxaban (XARELTO) tablet 20 mg (Start on 09/09/2013 10:00 AM)   Acute on chronic combined systolic and diastolic congestive heart failure   Atrial fibrillation   Asthma   Relevant Medications      mometasone-formoterol (DULERA) 100-5 MCG/ACT inhaler 2 puff      albuterol (PROVENTIL HFA;VENTOLIN HFA) 108 (90 BASE) MCG/ACT inhaler 1-2 puff   Wound of right leg   DIABETES MELLITUS, TYPE II   Relevant Medications      metFORMIN (GLUCOPHAGE) 1000 MG tablet      insulin aspart (novoLOG) injection 0-9 Units   HYPERTENSION    Other Visit Diagnoses   Chronic combined systolic and diastolic congestive heart failure

## 2013-08-19 NOTE — Consult Note (Addendum)
CARDIOLOGY CONSULT NOTE   Patient ID: [name] MRN: [id number] DOB/AGE: Feb 09, 1926 80 y.o.  Admit date: 08/18/2013  Primary Physician   Evelena Peat, DO Primary Cardiologist   Dr. Delton See Reason for Consultation   Bradycardia  HPI: [name] is a 80 y.o. female with a history of HTN, DMT2, Asthma, Churg-Strauss syndrome, AFIB on metoprolol, digoxin and Xarelto, CHF with EF of 20-25%, MR and TR (ECHO 02/2012) who presented to [facility] on 08/18/13 with complaints of generalized weakness, abdominal pain, and diarrhea. She had presented for similar complaints on 08/11/13 and was discharged with a prescription for Flagyl although no testing for C diff or other GI pathogen appears to have been completed at that time. In the [facility] a CTA of Abd/Pelvis did not reveal the cause for her abdominal pain but did show the possibility of a LLL PE. A subsequent CTA of the chest confirmed the presence of a LLL PE with evidence of right heart strain. She was then transferred to [facility] for admission. Patient has not had any diarrhea since admission and has been feeling better. She has had episodes of bradycardia yesterday and last night with heart rates 32-53 bpm. Patient does not complain of any lightheadedness, dizziness, pre-syncope, chest pain, SOB, orthopnea, PND, edema.     Past Medical History  Diagnosis Date  . Hypertension   . Diabetes mellitus   . Asthma   . Osteoarthritis     left shoulder  . Pseudogout   . Hypokalemia     a. Previously felt due to diuretics, req supplementation.  . Churg-Strauss syndrome     a. Sural nerve biopsy 02/2000.  [name] disease     a. s/p thyroidectomy.  . Glaucoma   . Hypercholesteremia   . Venous insufficiency   . Osteopenia     DEXA 2004  . Lipoma     left inner thigh  . Cataract     left eye  . VASCULITIS   . Chronic combined systolic and diastolic heart failure     a. 11/979 EF:20-25%  . Pleural effusion, left     a. Thoracentesis 2001 - per  notes, no malignant cells, was transudative.  Marland Kitchen NSVT (nonsustained ventricular tachycardia)     a. During CHF adm 2001.  . Multifocal atrial tachycardia     a. Documented as OP 01/2012.  Marland Kitchen Ectopic atrial tachycardia     a. Documented on tele as IP 02/2012.  . Pulmonary HTN     a. Mod by echo 02/2012.  . Valvular heart disease     a. Echo 02/2012: mod MR/TR, mild AI.  Marland Kitchen Pericardial effusion     a. Echo 02/2012: small-mod pericardial effusion.  . Atrial fibrillation, permanent     a. Dx 04/2013, on metoprolol, dig and xarelto     Past Surgical History  Procedure Laterality Date  . Abdominal hysterectomy    . Salpingoophorectomy    . Thyroidectomy    . Hemiarthroplasty hip Right 10/2010    cemented for hip fracture, femoral neck - by Eulas Post, MD  . Cataract extraction Right   . Hemiarthroplasty hip      No Known Allergies  I have reviewed the patient's current medications . albuterol  2.5 mg Nebulization Q6H  . brimonidine  1 drop Both Eyes TID  . dorzolamide  1 drop Left Eye BID  . insulin aspart  0-9 Units Subcutaneous Q4H  . mometasone-formoterol  2 puff Inhalation BID  .  sodium chloride  3 mL Intravenous Q12H  . sodium chloride  3 mL Intravenous Q12H   . heparin 1,250 Units/hr (08/19/13 0702)   sodium chloride, albuterol, hydrALAZINE, ondansetron (ZOFRAN) IV, ondansetron, sodium chloride  Prior to Admission medications   Medication Sig Start Date End Date Taking? Authorizing Provider  albuterol (PROVENTIL HFA;VENTOLIN HFA) 108 (90 BASE) MCG/ACT inhaler Inhale 1-2 puffs into the lungs every 6 (six) hours as needed for shortness of breath. 06/14/13  Yes Rocco Serene, MD  brimonidine (ALPHAGAN P) 0.1 % SOLN Place 1 drop into the left eye every 12 (twelve) hours. 05/27/12  Yes Lars Mage, MD  digoxin (LANOXIN) 0.125 MG tablet Take 1 tablet (0.125 mg total) by mouth daily. 05/19/13  Yes Vivi Barrack, MD  dorzolamide (TRUSOPT) 2 % ophthalmic solution Place 1 drop into  the left eye 2 (two) times daily.    Yes Historical Provider, MD  Fluticasone-Salmeterol (ADVAIR) 250-50 MCG/DOSE AEPB Inhale 1 puff into the lungs 2 (two) times daily. 06/15/13  Yes Rocco Serene, MD  furosemide (LASIX) 80 MG tablet Take 80 mg by mouth 2 (two) times daily.   Yes Historical Provider, MD  metFORMIN (GLUCOPHAGE) 1000 MG tablet Take 1,000 mg by mouth 2 (two) times daily with a meal.   Yes Historical Provider, MD  metoprolol succinate (TOPROL-XL) 50 MG 24 hr tablet Take 150 mg by mouth daily.  06/14/13  Yes Historical Provider, MD  metroNIDAZOLE (FLAGYL) 500 MG tablet Take 1 tablet (500 mg total) by mouth 3 (three) times daily. 08/11/13  Yes Dagmar Hait, MD  potassium chloride (MICRO-K) 10 MEQ CR capsule Take 20 mEq by mouth 2 (two) times daily.    Yes Historical Provider, MD  Rivaroxaban (XARELTO) 15 MG TABS tablet Take 1 tablet (15 mg total) by mouth daily with supper. 05/19/13  Yes Vivi Barrack, MD     History   Social History  . Marital Status: Widowed    Spouse Name: N/A    Number of Children: N/A  . Years of Education: N/A   Occupational History  . Retired    Social History Main Topics  . Smoking status: Never Smoker   . Smokeless tobacco: Never Used  . Alcohol Use: No  . Drug Use: No  . Sexual Activity: Not on file   Other Topics Concern  . Not on file   Social History Narrative   Lives in house, with family just next door.    Never smoker, no alcohol.      Used to work at [employer].    Before that, worked in food prep.                    Family Status  Relation Status Death Age  . Mother Deceased   . Father Deceased    Family History  Problem Relation Age of Onset  . Diabetes Mother   . Diabetes Father   . Heart disease Neg Hx      ROS:  Full 14 point review of systems complete and found to be negative unless listed above.  Physical Exam: Blood pressure 196/88, pulse 50, temperature 98.2 F (36.8 C), temperature  source Oral, resp. rate 19, height 5\' 5"  (1.651 m), weight 144 lb 13.5 oz (65.7 kg), SpO2 100.00%.  Constitutional: NAD HENT:  Head: Normocephalic and atraumatic.  Cardiovascular: Bradycardia present.  Murmur (systolic murmur heard best at 4ICS LSB) heard.  Pulmonary/Chest: She is in respiratory distress (paradoxical abdominal breathing,  accessory muscle use). She has wheezes (mild expiratory). She has no rales.  Abdominal: Soft. Bowel sounds are normal. She exhibits no distension. There is no tenderness.  Musculoskeletal: She exhibits no edema (dry appearing lower extremities).  Neurological: Drowsy, oriented x2, person and place  Skin: Skin is dry. She is not diaphoretic.    Labs:   Lab Results  Component Value Date   WBC 3.3* 08/19/2013   HGB 13.2 08/19/2013   HCT 41.2 08/19/2013   MCV 95.4 08/19/2013   PLT 241 08/19/2013    Recent Labs  08/18/13 1610  INR 1.22     Recent Labs Lab 08/18/13 1444 08/19/13 0545  NA 141 142  K 4.2 4.5  CL 103 103  CO2 29 25  BUN 7 7  CREATININE 0.60 0.61  CALCIUM 8.7 8.4  PROT 6.5  --   BILITOT 0.7  --   ALKPHOS 74  --   ALT 5  --   AST 13  --   GLUCOSE 118* 97  ALBUMIN 3.4*  --     Recent Labs  08/19/13 0211  TROPONINI <0.30    Lipase  Date/Time Value Range Status  08/18/2013  2:44 PM 25  11 - 59 U/L Final   TSH  Date/Time Value Range Status  05/18/2013  9:41 AM 1.505  0.350 - 4.500 uIU/mL Final     Performed at [facility]     Echo: Study Date: 03/04/2012 ------------------------------------------------------------ LV EF: 20% - 25% ------------------------------------------------------------ History: PMH: Dyspnea. Dilated cardiomyopathy. Risk factors: Hypertension. Diabetes mellitus. Dyslipidemia. ------------------------------------------------------------ Study Conclusions - Left ventricle: The cavity size was normal. Wall thickness was increased in a pattern of moderate LVH. Systolic function was severely  reduced. The estimated ejection fraction was in the range of 20% to 25%. there were no regional wall motion abnormalities. - Aortic valve: Mild regurgitation. - Mitral valve: Moderate regurgitation. - Right atrium: The atrium was moderately dilated. - Tricuspid valve: Moderate regurgitation. - Pulmonary arteries: Systolic pressure was moderately increased. PA peak pressure: 56mm Hg (S). - Pericardium, extracardiac: A small to moderate pericardial effusion was identified. -------------------------------------------------------- Left ventricle: The cavity size was normal. Wall thickness was increased in a pattern of moderate LVH. Systolic function was severely reduced. The estimated ejection fraction was in the range of 20% to 25%. there were no regional wall motion abnormalities.  ------------------------------------------------------------ Aortic valve: Trileaflet; normal thickness leaflets. Mobility was not restricted. Doppler: Transvalvular velocity was within the normal range. There was no stenosis. Mild regurgitation. ----------------------------------------------------------- Aorta: Aortic root: The aortic root was normal in size. ------------------------------------------------------------ Mitral valve: Structurally normal valve. Mobility was not restricted. Doppler: Transvalvular velocity was within the normal range. There was no evidence for stenosis. Moderate regurgitation. ------------------------------------------------------------ Left atrium: The atrium was normal in size. ------------------------------------------------------------ Right ventricle: The cavity size was normal. Wall thickness was normal. Systolic function was normal. ------------------------------------------------------------ Pulmonic valve: The valve appears to be grossly normal. Doppler: Transvalvular velocity was within the normal range. There was no evidence for stenosis.  Mild regurgitation. ------------------------------------------------------------ Tricuspid valve: Structurally normal valve. Doppler: Transvalvular velocity was within the normal range. Moderate regurgitation. ------------------------------------------------------------ Pulmonary artery: The main pulmonary artery was normal-sized. Systolic pressure was moderately increased. ------------------------------------------------------------ Right atrium: The atrium was moderately dilated. ------------------------------------------------------------ Pericardium: A small to moderate pericardial effusion was identified. ------------------------------------------------------------ Systemic veins: Inferior vena cava: The vessel was severely dilated; respirophasic changes in dimension were absent.     ECG: : 08/18/13, 23:41 Atrial fibrillation with bradycardia, 53 BPM. q waves  in inferior waves, unchanged from previous.   Radiology:  Dg Chest 2 View  08/18/2013   CLINICAL DATA:  Cough and wheezing.  EXAM: CHEST  2 VIEW  COMPARISON:  07/02/2013.  FINDINGS: The heart is enlarged but stable. There is tortuosity, ectasia and calcification of the thoracic aorta. Low lung volumes with vascular crowding and basilar atelectasis. No obvious infiltrates or effusions. No pneumothorax. The bony thorax is grossly intact.  IMPRESSION: Stable cardiac enlargement.  No obvious infiltrates or effusions.   Electronically Signed   By: Loralie Champagne M.D.   On: 08/18/2013 15:37   Ct Head Wo Contrast  08/18/2013   CLINICAL DATA:  Weakness, decreased appetite  EXAM: CT HEAD WITHOUT CONTRAST  TECHNIQUE: Contiguous axial images were obtained from the base of the skull through the vertex without intravenous contrast.  COMPARISON:  10/25/2010  FINDINGS: Moderate age-related atrophy. Moderate to severe low attenuation in the deep white matter. No evidence of acute infarct. No hemorrhage or extra-axial fluid. No hydrocephalus. Mild  inflammatory change in the sinuses, much less prominent when compared to the prior study.  IMPRESSION: Chronic involutional change.  No acute findings.   Electronically Signed   By: Esperanza Heir M.D.   On: 08/18/2013 16:10   Ct Angio Chest Pe W/cm &/or Wo Cm  08/18/2013   CLINICAL DATA:  Diarrhea and weakness. Decreased appetite. Abnormal appearance of the left lower lobe on recent CT of the abdomen and pelvis. Evaluate for potential pulmonary embolism.  EXAM: CT ANGIOGRAPHY CHEST WITH CONTRAST  TECHNIQUE: Multidetector CT imaging of the chest was performed using the standard protocol during bolus administration of intravenous contrast. Multiplanar CT image reconstructions including MIPs were obtained to evaluate the vascular anatomy.  CONTRAST:  50mL OMNIPAQUE IOHEXOL 350 MG/ML SOLN  COMPARISON:  CT of the abdomen and pelvis 08/18/2013.  FINDINGS: Mediastinum: Study is severely limited by a large amount of patient respiratory motion. Despite these limitations, there are are filling defects in the distal left lower lobe pulmonary artery, extending into all basal segmental and subsegmental sized branches, which appear completely occlusive, compatible with pulmonary embolism. No additional filling defects are otherwise noted. However, there is massive dilatation of the pulmonic trunk (5 cm in diameter) and the main pulmonary arteries, indicative of pulmonary arterial hypertension. Severe cardiomegaly is noted. There is no significant pericardial fluid, thickening or pericardial calcification. There is atherosclerosis of the thoracic aorta, the great vessels of the mediastinum and the coronary arteries, including calcified atherosclerotic plaque in the left main, left anterior descending and right coronary arteries. No pathologically enlarged mediastinal or hilar lymph nodes. Marked enlargement and heterogeneous appearance of the left lobe of the thyroid gland which measures up to 5.0 x 4.2 cm and has multiple  internal coarse calcifications.  Lungs/Pleura: Despite the extensive occlusive embolus in the left lower lobe, there is no consolidative airspace disease to suggest hemorrhage from pulmonary infarction at this time. Mild diffuse bronchial wall thickening. No pleural effusions. Multiple areas of linear scarring are noted, most pronounced in the medial segment of the right middle lobe and inferior segment of the lingula.  Upper Abdomen: See separate dictation for CT of the abdomen and pelvis 08/18/2013.  Musculoskeletal: There are no aggressive appearing lytic or blastic lesions noted in the visualized portions of the skeleton.  Review of the MIP images confirms the above findings.  IMPRESSION: 1. Study is positive for occlusive embolus in the distal left lower lobe pulmonary artery extending into all basal segmental and  subsegmental sized branches. No definite hemorrhage to suggest pulmonary infarction at this time. 2. Massively dilated pulmonic trunk and pulmonary arteries, indicative of underlying pulmonary arterial hypertension. 3. Severe cardiomegaly. 4. Atherosclerosis, including left main and 2 vessel coronary artery disease. 5. Markedly enlarged and heterogeneous appearing partially calcified masslike appearance of the left lobe of the thyroid gland. While this may simply represent a goiter, clinical correlation is recommended, and consideration for followup nonemergent thyroid ultrasound may be warranted if there is clinical concern for thyroid disease. 6. Additional incidental findings, as above.    Ct Abdomen Pelvis W Contrast  08/18/2013   CLINICAL DATA:  Diarrhea and weakness. Reduced appetite. Hypertension. Diabetes.  EXAM: CT ABDOMEN AND PELVIS WITH CONTRAST  TECHNIQUE: Multidetector CT imaging of the abdomen and pelvis was performed using the standard protocol following bolus administration of intravenous contrast.  CONTRAST:  OMNIPAQUE IOHEXOL 300 MG/ML  SOLN  COMPARISON:  None.  FINDINGS:  Prominent 4 chamber cardiomegaly. Abnormal could low-density linear branching structure in the left lower lobe, 0 representing either bronchiectasis with mucoid impaction in the visualize left lower lobe or pulmonary embolus. There is asymmetric reduced vascularity in the left lower lobe which might favor embolus. Right ventricular to left ventricular diameter ratio 1.0.  Diffuse hepatic steatosis with scattered hypodense lesions in the liver measuring up to 1.3 cm in diameter. Some of these may represent cysts but summer nonspecific  Spleen, pancreas, and adrenal glands normal. Hypoenhancement of the kidneys noted on initial arterial phase images, potentially from the renal artery atherosclerosis. The kidneys do symmetrically enhance on the delayed phase images.  Mild left greater than right hydroureter extending to the iliac vessel cross over.  Prominent stool throughout the colon favors constipation. Mild sigmoid colon redundancy. Aortoiliac atherosclerosis.  Right hemipelvis partially obscured by right hip implant. Oral contrast does not made its way to the distal small bowel up the time of imaging, in the appendix is not confidently seen. There is chondrocalcinosis at the pubic symphysis.  Moderate dextroconvex lumbar scoliosis with spondylosis and degenerative disc disease at all lumbar levels. There is mild grade 1 anterolisthesis at L5-S1 without pars defects.  There is chondrocalcinosis of the acetabular labrum.  IMPRESSION: 1. Branching soft tissue density in the left lower lobe could represent pulmonary embolus or a plugged bronchiectatic left lower lobe bronchus. Consider CT angiography of the chest. 2. Prominent cardiomegaly, with the right ventricle larger than the left. If there is pulmonary embolus, this could indicate right heart strain. 3. Diffuse hepatic steatosis. 4. Scattered hypodense liver lesions. These were noted on the report from the prior exam from 2001, but those images are not available  and I am not able to directly compare to see if any of these are new or changed. Accordingly, malignancy is not excluded. These could be further worked up with hepatic protocol MRI, if clinically warranted. 5. Mild hypo enhancement of the kidneys on arterial phase images, possibly due to renal artery atherosclerosis. 6. Mild left greater than right hydroureter proximally, extending to the iliac vessel cross over. No visible renal calculus. 7. Nonvisualization of the appendix. 8. 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 9. Moderate dextroconvex lumbar scoliosis with spondylosis and degenerative disc disease at all lumbar levels.       ASSESSMENT AND PLAN:    Principal Problem:   Pulmonary embolism Active Problems:   DIABETES MELLITUS, TYPE II   HYPERTENSION   CARDIOMYOPATHY, DILATED   Wound of right leg  Atrial fibrillation   Pulmonary emboli   Diastolic CHF   Ferne CoeHazel Deland is an 80 year old female with an extensive PMH admitted for a submassive pulmonary embolism. She is in slow Afib with episodes of bradycardia into the 30-40s overnight.   Afib with bradycardia --Newly diagnosed in 04/2013. On metoprolol and Digoxin for rate control, and xarelto for anticoagulation as an outpatient -- Heart rates in 30's last night -- Hold metoprolol and digoxin held given bradycardia  -- Hold Xarelto as patient is on heparin drip for PE  -- Stable, she is asymptomatic and in 50's now- continue to monitor  Hypertension  -- BP: 196/88 currently, consistently high since admission  -- Lasix held 2/2 hypovolemia, Metoprolol held 2/2 bradycardia -- Will order IV Hydralazine 10mg  q6 hours  -- Consider re-adding Lisinopril given hypertension with DM. Patient has not taken in over 1 month. -- Continue to monitor   Submassive Pulmonary embolism  -- Patient was found to have LLL occlusive PE, with massively dilated pulmonary arteries, with evidence of right heart strain.  -- Continue  Heparin gtt  -- ECHO pending  -- Troponin x1 neg -- Continue supplemental O2, BiPAP if needed.   Chronic systolic heart failure- Euvolemic on exam, no SOB. 144 lbs, which is down about 20lbs from discharge in Oct. Likely 2/2 hypovolemia from diarrhea. SOB imrpoved -- Last echo 03/04/13, EF 20-25%, ECHO pending -- Digoxin, Metoprolol held for now -- Continue to monitor  -- Strict I/Os, daily weights  Abdominal Pain and diarrhea  -- CT abdom/pelvis neg for acute cause, benign exam. U/A negative.  -- Obtain GI pathogen panel and C diff culture as patient has had recent antibiotic exposure   Diabetes Mellitus Type 2  -Last A1C 7.9 on 05/26/13  -Hold home metformin.  -CBG monitoring Q4  - SSI-S   Hx Graves Disease  - Goiter and Mass like appearance in left lobe on CT, will need outpatient follow up.    SignedThereasa Parkin: STERN, KATHRYN, PA-C 08/19/2013 8:10 AM  Patient examined chart reviewed.  Hr in 60's now with no symptoms.  LAE on eco 59 mm Dr Delton SeeNelson felt rate control and anticoagulation best.  Was supposed to be on xarelto 15 mg.  Given PE will consult pharmacy but I think best approach for anticoagulation would be xarelto 15 bid for PE dosing and stop heparin after two doses. Add ACE for BP control given low EF .  Resume lower dose beta blocker if HR goes over 90 Would use coreg 3.125 bid given low EF.  Will follow.    Charlton HawsPeter Shatora Weatherbee

## 2013-08-19 NOTE — Progress Notes (Signed)
Stat EKG done, Atrial Fibrillation HR 53

## 2013-08-19 NOTE — Progress Notes (Signed)
ANTICOAGULATION CONSULT NOTE - Initial Consult  Pharmacy Consult for Xarelto (transitioning off heparin gtt) Indication: pulmonary embolus  No Known Allergies  Patient Measurements: Height: 5\' 5"  (165.1 cm) Weight: 144 lb 13.5 oz (65.7 kg) IBW/kg (Calculated) : 57  Vital Signs: Temp: 98.6 F (37 C) (01/02 0800) Temp src: Oral (01/02 0800) BP: 206/85 mmHg (01/02 0900) Pulse Rate: 54 (01/02 0900)  Labs:  Recent Labs  08/18/13 1444 08/18/13 1610 08/19/13 0211 08/19/13 0545 08/19/13 0711  HGB 13.5  --   --  13.2  --   HCT 40.8  --   --  41.2  --   PLT 270  --   --  241  --   APTT  --   --   --  51*  --   LABPROT  --  15.1  --   --   --   INR  --  1.22  --   --   --   HEPARINUNFRC  --   --   --  0.11*  --   CREATININE 0.60  --   --  0.61  --   TROPONINI  --   --  <0.30  --  <0.30    Estimated Creatinine Clearance: 46.3 ml/min (by C-G formula based on Cr of 0.61).   Medical History: Past Medical History  Diagnosis Date  . Hypertension   . Diabetes mellitus   . Asthma   . Osteoarthritis     left shoulder  . Pseudogout   . Hypokalemia     a. Previously felt due to diuretics, req supplementation.  . Churg-Strauss syndrome     a. Sural nerve biopsy 02/2000.  Darene Lamer. Grave's disease     a. s/p thyroidectomy.  . Glaucoma   . Hypercholesteremia   . Venous insufficiency   . Osteopenia     DEXA 2004  . Lipoma     left inner thigh  . Cataract     left eye  . VASCULITIS   . Chronic combined systolic and diastolic heart failure     a. 9/52847/2013 EF:20-25%  . Pleural effusion, left     a. Thoracentesis 2001 - per notes, no malignant cells, was transudative.  Marland Kitchen. NSVT (nonsustained ventricular tachycardia)     a. During CHF adm 2001.  . Multifocal atrial tachycardia     a. Documented as OP 01/2012.  Marland Kitchen. Ectopic atrial tachycardia     a. Documented on tele as IP 02/2012.  . Pulmonary HTN     a. Mod by echo 02/2012.  . Valvular heart disease     a. Echo 02/2012: mod MR/TR, mild  AI.  Marland Kitchen. Pericardial effusion     a. Echo 02/2012: small-mod pericardial effusion.  . Atrial fibrillation, permanent     a. Dx 04/2013, on metoprolol, dig and xarelto    Medications:  Scheduled:  . albuterol  2.5 mg Nebulization Q6H  . brimonidine  1 drop Both Eyes TID  . dorzolamide  1 drop Left Eye BID  . insulin aspart  0-9 Units Subcutaneous Q4H  . losartan  50 mg Oral Daily  . mometasone-formoterol  2 puff Inhalation BID  . sodium chloride  3 mL Intravenous Q12H  . sodium chloride  3 mL Intravenous Q12H   Infusions:  . heparin 1,250 Units/hr (08/19/13 [id number])    Assessment: 80 yo F presented to ER with diarrhea and weakness found to have a PE per CT scan of chest. Patient on renally dosed Xarelto 15mg   once daily PTA for nonvalvular afib.  Xarelto held and was transitioned to a heparin gtt.  Pharmacy has now been consulted to transition patient back to Xarelto, but for PE dosing.  Patient's SCr is 0.61 with estimated CrCl~46 (if round SCr to 1, then CrCl~43.5).  Of note, renal adjustments for Xarelto are different depending on diagnosis.  Now that patient has PE, she will not get an adjusted dose at current kidney function.  Goal of Therapy:  Full anticoagulation on PO Xarelto Monitor platelets by anticoagulation protocol: Yes   Plan:  - will begin Xarelto PO 15mg  twice daily (with food) and time discontinuation of heparin gtt at administration of first dose today at ~1130 - continue Xarelto PO 15mg  twice daily with food for 21 days, followed by 20mg  once daily with supper - monitor for s/s of bleeding  Harrold Donath E. Achilles Dunk, PharmD Clinical Pharmacist - Resident Pager: [phone] Pharmacy: [phone] 08/19/2013 10:42 AM

## 2013-08-19 NOTE — Progress Notes (Signed)
Pt lethargic ,  HR 32-53 B/P 167/72  RR 15 02 Sat 92 % 02@2LNP  , using accessory muscles. Pt easily arose, denies chest pain or abdominal pain. MD at bedside. MD spoke to patients' daughter  Bobbye Charleston @ [phone].

## 2013-08-19 NOTE — Progress Notes (Signed)
ANTICOAGULATION CONSULT NOTE  Pharmacy Consult for heparin Indication: pulmonary embolus  No Known Allergies  Patient Measurements: Height: 5\' 5"  (165.1 cm) Weight: 144 lb 13.5 oz (65.7 kg) IBW/kg (Calculated) : 57 Wt 71.7kg (07/18/13), Ht 163cm  Vital Signs: Temp: 98.2 F (36.8 C) (01/02 0400) Temp src: Oral (01/02 0400) BP: 196/88 mmHg (01/02 0610) Pulse Rate: 50 (01/02 0610)  Labs:  Recent Labs  08/18/13 1444 08/18/13 1610 08/19/13 0211 08/19/13 0545  HGB 13.5  --   --  13.2  HCT 40.8  --   --  41.2  PLT 270  --   --  241  APTT  --   --   --  51*  LABPROT  --  15.1  --   --   INR  --  1.22  --   --   HEPARINUNFRC  --   --   --  0.11*  CREATININE 0.60  --   --   --   TROPONINI  --   --  <0.30  --     Estimated Creatinine Clearance: 46.3 ml/min (by C-G formula based on Cr of 0.6).  Assessment: 80 yo female with PE for heparin  Goal of Therapy:  Heparin level 0.3-0.7 units/ml Monitor platelets by anticoagulation protocol: Yes   Plan:  Heparin 2000 units IV bolus, then increase heparin 1250 units/hr Check heparin level in 6 hours.   Geannie Risen, PharmD, BCPS  08/19/2013 6:56 AM

## 2013-08-19 NOTE — Progress Notes (Signed)
Pt O3x, no complaints of pain or sob. Will continue to monitor

## 2013-08-19 NOTE — H&P (Signed)
Internal Medicine Attending Admission Note Date: 08/19/2013  Patient name: [name] Medical record number: [id number] Date of birth: 11/25/1925 Age: 80 y.o. Gender: female  I saw and evaluated the patient. I reviewed the resident's note and I agree with the resident's findings and plan as documented in the resident's note.  [name] is an 80 year old woman with a history of diabetes, hypertension, atrial fibrillation, asthma, and systolic heart failure with an ejection fraction of 20-25% who presents to the emergency department with abdominal pain and diarrhea. A CT scan of the abdomen failed to reveal the cause of the abdominal pain but raised the concern for a left lower lobe pulmonary embolism. A CT angiogram of the chest confirmed a left lower lobe pulmonary embolism and suggested right ventricular strain. She was therefore admitted to the internal medicine teaching service for further evaluation.  On rounds this morning [name] feels well with only mild abdominal pain and no diarrhea since admission. She does admit to over all generalized weakness but denies shortness of breath or chest pain. Pulmonary exam is unremarkable and her abdomen is without tenderness to palpation. Her bowel sounds are active. She has a healing lesion on her right leg which she states was obtained when her son was trying to move her by pulling on the leg approximately 5 weeks ago. Although she claims compliance with her Xarelto there is some concern there may have been a few missed doses. 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 She had trauma to her right lower extremity 5 weeks ago and has chronic stasis related to her significant left ventricular cardiomyopathy. This is likely the nidus for her thromboembolism, especially if she missed a dose or so of her Xarelto.  We will restart the Xarelto and stressed to her the importance of taking every dose. We have held the digoxin and metoprolol for her bradycardia. If  her bradycardia resolves and her pulse is greater than 110 we will consider restarting the beta blocker. An ACE inhibitor will be restarted for her hypertension. She has a mass in her thyroid and this will be further evaluated with a thyroid ultrasound. Depending on the results it may require further testing including fine-needle aspirate if the patient should desire further evaluation. Otherwise, her other multiple medical problems will be treated with the outpatient regimen. She is stable for transfer out of the step down unit today.

## 2013-08-19 NOTE — Care Management Note (Addendum)
  Page 2 of 2   08/23/2013     4:57:45 PM   CARE MANAGEMENT NOTE 08/23/2013  Patient:  [name], [name]   Account Number:  [id number]  Date Initiated:  08/19/2013  Documentation initiated by:  Junius Creamer  Subjective/Objective Assessment:   adm w pul embolus     Action/Plan:   lives alone, pcp dr Trinna Post wilson   Anticipated DC Date:  08/22/2013   Anticipated DC Plan:  HOME W HOME HEALTH SERVICES      DC Planning Services  CM consult      [facility] Choice  Resumption Of Svcs/PTA Provider   Choice offered to / List presented to:     DME arranged  HOSPITAL BED  OTHER - SEE COMMENT  WHEELCHAIR - ELECTRIC      DME agency  [facility]     HH arranged  HH-1 RN  HH-2 PT      [facility] agency  [facility]   Status of service:   Medicare Important Message given?   (If response is "NO", the following Medicare IM given date fields will be blank) Date Medicare IM given:   Date Additional Medicare IM given:    Discharge Disposition:  HOME W HOME HEALTH SERVICES  Per UR Regulation:  Reviewed for med. necessity/level of care/duration of stay  If discussed at Long Length of Stay Meetings, dates discussed:    Comments:  08/23/2013 Ambulance here to pick up patient, however, family states hospital bed has not been delivered to home. States patient is able to use own bed until bed arrives. CM contacted [facility], [place] to f/u on delivery of bed who f/u and states bed delivery scheduled by 5pm. CM confirmed family in home for delivery. Patient d/c via ambulance to home. [name] RN, BSN, [place], CCM 08/23/2013  08/22/13 1645 [name], RN, BSN, [place] [phone] Pt to discharge to daughter [name]) [address] [place], [place] [address]. [phone] (h) [phone]. DME (hospital bed, wheelchaor with cushion, and hoyer lift) to be delivered to home tomorrow and pt home by ambulance.  [phone] [name], RN, BSN, [employer] [phone] Spoke with pt at bedside  regarding discharge planning.  Pt currently active with [facility] for RN services, PT services added.  [name] of [facility] notified.  DME Equipment Recommendations: Hospital bed;Wheelchair cushion (measurements PT);Wheelchair (measurements PT);Other (comment) (may  need lift depending progress ).  Will verify with pt family before ordering.  1-2 1444 debbie dowell rn,bsn pt act w [facility] for hhrn. donna w [facility] aware of pt in hosp.

## 2013-08-19 NOTE — Progress Notes (Signed)
Report called to 2900 RN also daughter called and informed of transfer. All belongings taken with patient.

## 2013-08-19 NOTE — Progress Notes (Signed)
  Echocardiogram 2D Echocardiogram has been performed.  Hymie Gorr 08/19/2013, 11:51 AM

## 2013-08-19 NOTE — Progress Notes (Signed)
[facility]  Patient Status: Active (receiving services up to time of hospitalization)  [facility] is providing the following services: RN  If patient discharges after hours, please call [phone].   [name] 08/19/2013, 12:42 PM

## 2013-08-19 NOTE — Progress Notes (Signed)
Pt state no drops in RT eye, after RN applied. Order for both eyes. Pt home list says just lt Eye

## 2013-08-19 NOTE — Progress Notes (Signed)
Pt last BM 12-29. Can we remove c. Diff?

## 2013-08-19 NOTE — H&P (Signed)
Date: 08/19/2013               Patient Name:  [name] MRN: [id number]  DOB: Feb 18, 1926 Age / Sex: 80 y.o., female   PCP: Duwaine Maxin, DO         Medical Service: Internal Medicine Teaching Service         Attending Physician: Dr. Karren Cobble, MD    First Contact: Dr. Mathis Fare Pager: [phone]  Second Contact: Dr. Eula Fried Pager: [phone]       After Hours (After 5p/  First Contact Pager: [phone]  weekends / holidays): Second Contact Pager: [phone]   Chief Complaint: Abdominal Pain/ Diarrhea  History of Present Illness: [name] is a 80 y.o. female with a PMHx of HTN, DMT2, Asthma, Churg-Strauss syndrome, A fib on Xarelto, CHF with EF of 20-25%, MR and TR. [name] presented to [facility] on 08/18/13 accompanied by her daughter with complaints of generalized weakness, abdominal pain, and diarrhea.  She had presented for similar complaints on 08/11/13 and was discharged with a prescription for Flagyl although no testing for C diff or other GI pathogen appears to have been completed at that time.  In the [facility] a CTA of Abd/Pelvis did not reveal the cause for her abdominal pain but did show the possibility of a LLL PE.  A subsequent CTA of the chest confirmed the presence of a submassive LLL PE with evidence of right heart strain.  She was then transferred to [facility] for admission.  On her arrival [name] was drowsy but did report that she had no pain, a little bit of SOB, mostly she reported feeling weak with no appetite.  She admitted some abdominal pain and diarrhea, but denies N/V.  She denies any chest pain or palpitations.   Meds: Prescriptions prior to admission  Medication Sig Dispense Refill  . albuterol (PROVENTIL HFA;VENTOLIN HFA) 108 (90 BASE) MCG/ACT inhaler Inhale 1-2 puffs into the lungs every 6 (six) hours as needed for shortness of breath.  18 g  11  . brimonidine (ALPHAGAN P) 0.1 % SOLN Place 1 drop into the left eye every 12 (twelve) hours.  15 mL  1  . digoxin  (LANOXIN) 0.125 MG tablet Take 1 tablet (0.125 mg total) by mouth daily.  30 tablet  3  . dorzolamide (TRUSOPT) 2 % ophthalmic solution Place 1 drop into the left eye 2 (two) times daily.       . Fluticasone-Salmeterol (ADVAIR) 250-50 MCG/DOSE AEPB Inhale 1 puff into the lungs 2 (two) times daily.  60 each  11  . furosemide (LASIX) 80 MG tablet Take 80 mg by mouth 2 (two) times daily.      . metFORMIN (GLUCOPHAGE) 1000 MG tablet Take 1,000 mg by mouth 2 (two) times daily with a meal.      . metoprolol succinate (TOPROL-XL) 50 MG 24 hr tablet Take 150 mg by mouth daily.       . metroNIDAZOLE (FLAGYL) 500 MG tablet Take 1 tablet (500 mg total) by mouth 3 (three) times daily.  30 tablet  0  . potassium chloride (MICRO-K) 10 MEQ CR capsule Take 20 mEq by mouth 2 (two) times daily.       . Rivaroxaban (XARELTO) 15 MG TABS tablet Take 1 tablet (15 mg total) by mouth daily with supper.  30 tablet  11    Allergies: Allergies as of 08/18/2013  . (No Known Allergies)   Past Medical History  Diagnosis Date  .  Hypertension   . Diabetes mellitus   . Asthma   . Osteoarthritis     left shoulder  . Pseudogout   . Hypokalemia     a. Previously felt due to diuretics, req supplementation.  . Churg-Strauss syndrome     a. Sural nerve biopsy 02/2000.  . Grave's disease     a. s/p thyroidectomy.  . Glaucoma   . Hypercholesteremia   . Venous insufficiency   . Osteopenia     DEXA 2004  . Lipoma     left inner thigh  . Cataract     left eye  . VASCULITIS   . Chronic combined systolic and diastolic heart failure     a. EF 25-35% 2001 req diuresis, thoracentesis. b. EF improved to 50-55% by echo 2003. c. EF 20-25% by echo 02/2012 with mild AI, mod MR/TR, PAP 56mmHg, sm-mod pericardial effusion --> subsequent abnormal Myoview. Treated medically. ?Tachy-mediated.  . Pleural effusion, left     a. Thoracentesis 2001 - per notes, no malignant cells, was transudative.  . NSVT (nonsustained ventricular  tachycardia)     a. During CHF adm 2001.  . Multifocal atrial tachycardia     a. Documented as OP 01/2012.  . Ectopic atrial tachycardia     a. Documented on tele as IP 02/2012.  . Pulmonary HTN     a. Mod by echo 02/2012.  . Valvular heart disease     a. Echo 02/2012: mod MR/TR, mild AI.  . Pericardial effusion     a. Echo 02/2012: small-mod pericardial effusion.   Past Surgical History  Procedure Laterality Date  . Abdominal hysterectomy    . Salpingoophorectomy    . Thyroidectomy    . Hemiarthroplasty hip Right 10/2010    cemented for hip fracture, femoral neck - by Joshua P Landau, MD  . Cataract extraction Right   . Hemiarthroplasty hip     Family History  Problem Relation Age of Onset  . Diabetes Mother   . Diabetes Father   . Heart disease Neg Hx    History   Social History  . Marital Status: Widowed    Spouse Name: N/A    Number of Children: N/A  . Years of Education: N/A   Occupational History  . Retired    Social History Main Topics  . Smoking status: Never Smoker   . Smokeless tobacco: Never Used  . Alcohol Use: No  . Drug Use: No  . Sexual Activity: Not on file   Other Topics Concern  . Not on file   Social History Narrative   Lives in house, with family just next door.    Never smoker, no alcohol.      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    Before that, worked in food prep.                    Review of Systems: Review of Systems  Constitutional: Negative for fever, chills, weight loss and malaise/fatigue.  Eyes: Negative for blurred vision.  Respiratory: Negative for cough.   Cardiovascular: Negative for chest pain, palpitations and leg swelling.  Gastrointestinal: Positive for abdominal pain and diarrhea. Negative for nausea, vomiting, constipation and blood in stool.  Genitourinary: Negative for dysuria and frequency.  Musculoskeletal: Negative for back pain and neck pain.  Neurological: Positive for weakness. Negative for focal  weakness and headaches.  Psychiatric/Behavioral: The patient is not nervous/anxious.      Physical Exam: Blood pressure 177/87,   pulse 54, temperature 97.8 F (36.6 C), temperature source Oral, resp. rate 17, weight 153 lb 10.6 oz (69.7 kg), SpO2 96.00%. Physical Exam  Nursing note and vitals reviewed. Constitutional: No distress.  HENT:  Head: Normocephalic and atraumatic.  Cardiovascular: Bradycardia present.   Murmur (systolic murmur heard best at 4ICS LSB) heard. Pulmonary/Chest: She is in respiratory distress (paradoxical abdominal breathing, accessory muscle use). She has wheezes (mild expiratory). She has no rales.  Abdominal: Soft. Bowel sounds are normal. She exhibits no distension. There is no tenderness.  Musculoskeletal: She exhibits no edema (dry appearing lower extremities).  Neurological:  Drowsy, oriented x2, person and place  Skin: Skin is dry. She is not diaphoretic.       Wt Readings from Last 5 Encounters:  08/18/13 153 lb 10.6 oz (69.7 kg)  07/18/13 158 lb 1.6 oz (71.714 kg)  05/26/13 166 lb 12.8 oz (75.66 kg)  05/19/13 165 lb 2 oz (74.9 kg)  05/02/13 184 lb 6.4 oz (83.643 kg)    Lab results: Basic Metabolic Panel:  Recent Labs  08/18/13 1444  NA 141  K 4.2  CL 103  CO2 29  GLUCOSE 118*  BUN 7  CREATININE 0.60  CALCIUM 8.7   Liver Function Tests:  Recent Labs  08/18/13 1444  AST 13  ALT 5  ALKPHOS 74  BILITOT 0.7  PROT 6.5  ALBUMIN 3.4*    Recent Labs  08/18/13 1444  LIPASE 25   CBC:  Recent Labs  08/18/13 1444  WBC 2.9*  NEUTROABS 1.0*  HGB 13.5  HCT 40.8  MCV 92.7  PLT 270   CBG:  Recent Labs  08/18/13 2040  GLUCAP 131*   Coagulation:  Recent Labs  08/18/13 1610  LABPROT 15.1  INR 1.22   Urinalysis:  Recent Labs  08/18/13 1640  COLORURINE YELLOW  LABSPEC 1.017  PHURINE 7.0  GLUCOSEU NEGATIVE  HGBUR NEGATIVE  BILIRUBINUR NEGATIVE  KETONESUR NEGATIVE  PROTEINUR NEGATIVE  UROBILINOGEN 0.2    NITRITE NEGATIVE  LEUKOCYTESUR NEGATIVE    Imaging results:  Dg Chest 2 View  08/18/2013   CLINICAL DATA:  Cough and wheezing.  EXAM: CHEST  2 VIEW  COMPARISON:  07/02/2013.  FINDINGS: The heart is enlarged but stable. There is tortuosity, ectasia and calcification of the thoracic aorta. Low lung volumes with vascular crowding and basilar atelectasis. No obvious infiltrates or effusions. No pneumothorax. The bony thorax is grossly intact.  IMPRESSION: Stable cardiac enlargement.  No obvious infiltrates or effusions.   Electronically Signed   By: Mark  Gallerani M.D.   On: 08/18/2013 15:37   Ct Head Wo Contrast  08/18/2013   CLINICAL DATA:  Weakness, decreased appetite  EXAM: CT HEAD WITHOUT CONTRAST  TECHNIQUE: Contiguous axial images were obtained from the base of the skull through the vertex without intravenous contrast.  COMPARISON:  10/25/2010  FINDINGS: Moderate age-related atrophy. Moderate to severe low attenuation in the deep white matter. No evidence of acute infarct. No hemorrhage or extra-axial fluid. No hydrocephalus. Mild inflammatory change in the sinuses, much less prominent when compared to the prior study.  IMPRESSION: Chronic involutional change.  No acute findings.   Electronically Signed   By: Raymond  Rubner M.D.   On: 08/18/2013 16:10   Ct Angio Chest Pe W/cm &/or Wo Cm  08/18/2013   CLINICAL DATA:  Diarrhea and weakness. Decreased appetite. Abnormal appearance of the left lower lobe on recent CT of the abdomen and pelvis. Evaluate for potential pulmonary embolism.  EXAM:   CT ANGIOGRAPHY CHEST WITH CONTRAST  TECHNIQUE: Multidetector CT imaging of the chest was performed using the standard protocol during bolus administration of intravenous contrast. Multiplanar CT image reconstructions including MIPs were obtained to evaluate the vascular anatomy.  CONTRAST:  50mL OMNIPAQUE IOHEXOL 350 MG/ML SOLN  COMPARISON:  CT of the abdomen and pelvis 08/18/2013.  FINDINGS: Mediastinum: Study is  severely limited by a large amount of patient respiratory motion. Despite these limitations, there are are filling defects in the distal left lower lobe pulmonary artery, extending into all basal segmental and subsegmental sized branches, which appear completely occlusive, compatible with pulmonary embolism. No additional filling defects are otherwise noted. However, there is massive dilatation of the pulmonic trunk (5 cm in diameter) and the main pulmonary arteries, indicative of pulmonary arterial hypertension. Severe cardiomegaly is noted. There is no significant pericardial fluid, thickening or pericardial calcification. There is atherosclerosis of the thoracic aorta, the great vessels of the mediastinum and the coronary arteries, including calcified atherosclerotic plaque in the left main, left anterior descending and right coronary arteries. No pathologically enlarged mediastinal or hilar lymph nodes. Marked enlargement and heterogeneous appearance of the left lobe of the thyroid gland which measures up to 5.0 x 4.2 cm and has multiple internal coarse calcifications.  Lungs/Pleura: Despite the extensive occlusive embolus in the left lower lobe, there is no consolidative airspace disease to suggest hemorrhage from pulmonary infarction at this time. Mild diffuse bronchial wall thickening. No pleural effusions. Multiple areas of linear scarring are noted, most pronounced in the medial segment of the right middle lobe and inferior segment of the lingula.  Upper Abdomen: See separate dictation for CT of the abdomen and pelvis 08/18/2013.  Musculoskeletal: There are no aggressive appearing lytic or blastic lesions noted in the visualized portions of the skeleton.  Review of the MIP images confirms the above findings.  IMPRESSION: 1. Study is positive for occlusive embolus in the distal left lower lobe pulmonary artery extending into all basal segmental and subsegmental sized branches. No definite hemorrhage to  suggest pulmonary infarction at this time. 2. Massively dilated pulmonic trunk and pulmonary arteries, indicative of underlying pulmonary arterial hypertension. 3. Severe cardiomegaly. 4. Atherosclerosis, including left main and 2 vessel coronary artery disease. 5. Markedly enlarged and heterogeneous appearing partially calcified masslike appearance of the left lobe of the thyroid gland. While this may simply represent a goiter, clinical correlation is recommended, and consideration for followup nonemergent thyroid ultrasound may be warranted if there is clinical concern for thyroid disease. 6. Additional incidental findings, as above.   Electronically Signed   By: Daniel  Entrikin M.D.   On: 08/18/2013 17:29   Ct Abdomen Pelvis W Contrast  08/18/2013   CLINICAL DATA:  Diarrhea and weakness. Reduced appetite. Hypertension. Diabetes.  EXAM: CT ABDOMEN AND PELVIS WITH CONTRAST  TECHNIQUE: Multidetector CT imaging of the abdomen and pelvis was performed using the standard protocol following bolus administration of intravenous contrast.  CONTRAST:  100mL OMNIPAQUE IOHEXOL 300 MG/ML  SOLN  COMPARISON:  None.  FINDINGS: Prominent 4 chamber cardiomegaly. Abnormal could low-density linear branching structure in the left lower lobe, 0 representing either bronchiectasis with mucoid impaction in the visualize left lower lobe or pulmonary embolus. There is asymmetric reduced vascularity in the left lower lobe which might favor embolus. Right ventricular to left ventricular diameter ratio 1.0.  Diffuse hepatic steatosis with scattered hypodense lesions in the liver measuring up to 1.3 cm in diameter. Some of these may represent cysts but summer nonspecific    Spleen, pancreas, and adrenal glands normal. Hypoenhancement of the kidneys noted on initial arterial phase images, potentially from the renal artery atherosclerosis. The kidneys do symmetrically enhance on the delayed phase images.  Mild left greater than right hydroureter  extending to the iliac vessel cross over.  Prominent stool throughout the colon favors constipation. Mild sigmoid colon redundancy. Aortoiliac atherosclerosis.  Right hemipelvis partially obscured by right hip implant. Oral contrast does not made its way to the distal small bowel up the time of imaging, in the appendix is not confidently seen. There is chondrocalcinosis at the pubic symphysis.  Moderate dextroconvex lumbar scoliosis with spondylosis and degenerative disc disease at all lumbar levels. There is mild grade 1 anterolisthesis at L5-S1 without pars defects.  There is chondrocalcinosis of the acetabular labrum.  IMPRESSION: 1. Branching soft tissue density in the left lower lobe could represent pulmonary embolus or a plugged bronchiectatic left lower lobe bronchus. Consider CT angiography of the chest. 2. Prominent cardiomegaly, with the right ventricle larger than the left. If there is pulmonary embolus, this could indicate right heart strain. 3. Diffuse hepatic steatosis. 4. Scattered hypodense liver lesions. These were noted on the report from the prior exam from 2001, but those images are not available and I am not able to directly compare to see if any of these are new or changed. Accordingly, malignancy is not excluded. These could be further worked up with hepatic protocol MRI, if clinically warranted. 5. Mild hypo enhancement of the kidneys on arterial phase images, possibly due to renal artery atherosclerosis. 6. Mild left greater than right hydroureter proximally, extending to the iliac vessel cross over. No visible renal calculus. 7. Nonvisualization of the appendix. 8. 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 9. Moderate dextroconvex lumbar scoliosis with spondylosis and degenerative disc disease at all lumbar levels.   Electronically Signed   By: Walt  Liebkemann M.D.   On: 08/18/2013 16:21    Other results: EKG: 08/18/13, 23:41 Atrial fibrillation with bradycardia,  53 BPM. q waves in inferior waves, unchanged from previous.  Assessment & Plan by Problem: [name] is an 80 year old female with an extensive PMH admitted for a submassive pulmonary embolism.    Submassive Pulmonary embolism - Patient was found to have LLL occlusive PE, with massively dilated pulmonary arteries, with evidence of right heart strain. - Admit to stepdown (inpatient) - Continue Heparin gtt - Obtain STAT ABG - TTE - Monitor respiratory status closely - Cycle cardiac enzymes - Continue supplemental O2, BiPAP if needed.   Hypovolemia - Likely secondary to diarrhea, poor PO intake, and diuretic therapy. Patient's weight has decreased 5 lbs since last clinic visit 1 month ago. - Will monitor closely and hold off on IVF at this time given PE and hypertension.  Dilated Cardiomyopathy  -Last echo 03/04/13, EF 20-25% - Patient may need PPM if repeat echo shows persistent low EF. - Hold Digoxin, Metoprolol -Will consult cardiology for further recommendations   Afib with bradycardia - Hold metoprolol given bradycardia - Hold Xarelto as patient is on heparin drip - Cardiology consulted, given multiple bradycardic episodes to 30s overnight.  Abdominal Pain and diarrhea - CT abdom/pelvis neg for acute cause, benign exam. U/A negative. - Obtain GI pathogen panel and C diff culture as patient has had recent antibiotic exposure . Right tibial Wound -Consider wound care consult in AM  Hypertension -BP: 177/87 on admission - Hold home Lasix (given hypovolemia) - Hold Metoprolol (given bradycardia) - Lisinopril 20mg   QD was discontinued by pharmacy tech as she has not taken it in last 30 days. - Will resume lisinopril if BP >180/100. - Continue to monitor  Diabetes Mellitus Type 2 -Last A1C 7.9 on 05/26/13 -Hold home metformin. -CBG monitoring Q4 - SSI-S   Hx Graves Disease - Goiter and Mass like appearance in left lobe on CT, will need outpatient follow up.   Hepatic  steatosis with scattered hypodense liver lesions - Found on Abdominal CT and lesions noted on 2001 CT. -AST, ALT, and Alk phos wnl. - Consider Hepatic Protocol MRI.  Diet: NPO Code Status: Full (confirmed this with daughter by telephone) DVT PPx: Already treated with full dose Heparin Dispo: Disposition is deferred at this time, awaiting improvement of current medical problems. Anticipated discharge in approximately 2 day(s).   The patient does have a current PCP Duwaine Maxin, DO) and does need an [facility] hospital follow-up appointment after discharge.  The patient does not know have transportation limitations that hinder transportation to clinic appointments.  Signed: Joni Reining, DO 08/19/2013, 12:12 AM

## 2013-08-20 ENCOUNTER — Inpatient Hospital Stay (HOSPITAL_COMMUNITY): Payer: PRIVATE HEALTH INSURANCE

## 2013-08-20 DIAGNOSIS — I5043 Acute on chronic combined systolic (congestive) and diastolic (congestive) heart failure: Secondary | ICD-10-CM

## 2013-08-20 DIAGNOSIS — I509 Heart failure, unspecified: Secondary | ICD-10-CM

## 2013-08-20 LAB — GLUCOSE, CAPILLARY
GLUCOSE-CAPILLARY: 117 mg/dL — AB (ref 70–99)
GLUCOSE-CAPILLARY: 150 mg/dL — AB (ref 70–99)
Glucose-Capillary: 158 mg/dL — ABNORMAL HIGH (ref 70–99)
Glucose-Capillary: 124 mg/dL — ABNORMAL HIGH (ref 70–99)

## 2013-08-20 MED ORDER — LISINOPRIL 20 MG PO TABS
20.0000 mg | ORAL_TABLET | Freq: Every day | ORAL | Status: DC
  Administered 2013-08-20 – 2013-08-23 (×4): 20 mg via ORAL
  Filled 2013-08-20 (×4): qty 1

## 2013-08-20 NOTE — Progress Notes (Signed)
   SUBJECTIVE:  She reports that she is breathing better and has no pain.  She wants to walk today.   PHYSICAL EXAM Filed Vitals:   08/19/13 1700 08/19/13 1712 08/19/13 2038 08/20/13 0500  BP:  133/58 133/61 160/72  Pulse:  69 72 62  Temp:  98.4 F (36.9 C) 98 F (36.7 C) 98.4 F (36.9 C)  TempSrc: Oral Oral Oral Oral  Resp:  18 18 18   Height:      Weight:    151 lb 3.2 oz (68.584 kg)  SpO2:  96% 95% 94%   General:  No distress Lungs:  Transmitted upper airway wheezing Heart:  RRR Abdomen:  Positive bowel sounds, no rebound no guarding Extremities:  No edema  LABS: Lab Results  Component Value Date   TROPONINI <0.30 08/19/2013   Results for orders placed during the hospital encounter of 08/18/13 (from the past 24 hour(s))  GLUCOSE, CAPILLARY     Status: None   Collection Time    08/19/13  8:14 AM      Result Value Range   Glucose-Capillary 81  70 - 99 mg/dL  TROPONIN I     Status: None   Collection Time    08/19/13 12:18 PM      Result Value Range   Troponin I <0.30  <0.30 ng/mL  GLUCOSE, CAPILLARY     Status: Abnormal   Collection Time    08/19/13 12:20 PM      Result Value Range   Glucose-Capillary 204 (*) 70 - 99 mg/dL  GLUCOSE, CAPILLARY     Status: Abnormal   Collection Time    08/19/13  5:30 PM      Result Value Range   Glucose-Capillary 100 (*) 70 - 99 mg/dL  GLUCOSE, CAPILLARY     Status: Abnormal   Collection Time    08/20/13  6:48 AM      Result Value Range   Glucose-Capillary 117 (*) 70 - 99 mg/dL    Intake/Output Summary (Last 24 hours) at 08/20/13 0801 Last data filed at 08/20/13 0746  Gross per 24 hour  Intake  777.5 ml  Output    400 ml  Net  377.5 ml    ASSESSMENT AND PLAN:  BRADYCARDIA:  Atrial fib.  Telemetry reviewed today and no evidence of sustained or symptomatic bradyarrhythmia or pauses.  Off beta blockers and dig.    PE:  On Xarelto.    CHRONIC SYSTOLIC HF:  Continue current therapy.    Fayrene Fearing [facility] 08/20/2013 8:01  AM

## 2013-08-20 NOTE — Progress Notes (Addendum)
Subjective:  Pt seen and examined in AM. No acute events overnight.Pt states she feels well. She is no longer weak. She denies fever, chills, cough, dyspnea, chest pain, lightheadedness, leg swelling, nausea, vomiting, abdominal pain, or change in BM or urination.    Objective: Vital signs in last 24 hours: Filed Vitals:   08/19/13 2038 08/20/13 0500 08/20/13 0917 08/20/13 1535  BP: 133/61 160/72  151/80  Pulse: 72 62  70  Temp: 98 F (36.7 C) 98.4 F (36.9 C)  97.6 F (36.4 C)  TempSrc: Oral Oral  Oral  Resp: 18 18  20   Height:      Weight:  68.584 kg (151 lb 3.2 oz)    SpO2: 95% 94% 94% 99%   Weight change: -1.116 kg (-2 lb 7.4 oz)  Intake/Output Summary (Last 24 hours) at 08/20/13 1814 Last data filed at 08/20/13 1338  Gross per 24 hour  Intake    720 ml  Output    400 ml  Net    320 ml   Constitutional: No distress.  HENT:  Head: Normocephalic and atraumatic.  Cardiovascular: Normal rate, regular rhythm, murmur present  Pulmonary/Chest: She is in not in respiiatory distress.  No wheezing, ronchi, or rales.  Abdomen: Soft. Bowel sounds are normal. She exhibits no distension. There is no tenderness.  Musculoskeletal: She exhibits no edema   Neurological: Alert & Oriented x 3   Skin: Skin is dry. She is not diaphoretic   Lab Results: Basic Metabolic Panel:  Recent Labs Lab 08/18/13 1444 08/19/13 0545  NA 141 142  K 4.2 4.5  CL 103 103  CO2 29 25  GLUCOSE 118* 97  BUN 7 7  CREATININE 0.60 0.61  CALCIUM 8.7 8.4   Liver Function Tests:  Recent Labs Lab 08/18/13 1444  AST 13  ALT 5  ALKPHOS 74  BILITOT 0.7  PROT 6.5  ALBUMIN 3.4*    Recent Labs Lab 08/18/13 1444  LIPASE 25   No results found for this basename: AMMONIA,  in the last 168 hours CBC:  Recent Labs Lab 08/18/13 1444 08/19/13 0545  WBC 2.9* 3.3*  NEUTROABS 1.0*  --   HGB 13.5 13.2  HCT 40.8 41.2  MCV 92.7 95.4  PLT 270 241   Cardiac Enzymes:  Recent Labs Lab  08/19/13 0211 08/19/13 0711 08/19/13 1218  TROPONINI <0.30 <0.30 <0.30   BNP: No results found for this basename: PROBNP,  in the last 168 hours D-Dimer: No results found for this basename: DDIMER,  in the last 168 hours CBG:  Recent Labs Lab 08/19/13 0814 08/19/13 1220 08/19/13 1730 08/20/13 0648 08/20/13 1112 08/20/13 1713  GLUCAP 81 204* 100* 117* 150* 158*   Hemoglobin A1C: No results found for this basename: HGBA1C,  in the last 168 hours Fasting Lipid Panel: No results found for this basename: CHOL, HDL, LDLCALC, TRIG, CHOLHDL, LDLDIRECT,  in the last 168 hours Thyroid Function Tests:  Recent Labs Lab 08/19/13 0545  TSH 1.563   Coagulation:  Recent Labs Lab 08/18/13 1610  LABPROT 15.1  INR 1.22   Anemia Panel: No results found for this basename: VITAMINB12, FOLATE, FERRITIN, TIBC, IRON, RETICCTPCT,  in the last 168 hours Urine Drug Screen: Drugs of Abuse  No results found for this basename: labopia, cocainscrnur, labbenz, amphetmu, thcu, labbarb    Alcohol Level: No results found for this basename: ETH,  in the last 168 hours Urinalysis:  Recent Labs Lab 08/18/13 1640  COLORURINE YELLOW  LABSPEC  1.017  PHURINE 7.0  GLUCOSEU NEGATIVE  HGBUR NEGATIVE  BILIRUBINUR NEGATIVE  KETONESUR NEGATIVE  PROTEINUR NEGATIVE  UROBILINOGEN 0.2  NITRITE NEGATIVE  LEUKOCYTESUR NEGATIVE    Micro Results: Recent Results (from the past 240 hour(s))  MRSA PCR SCREENING     Status: None   Collection Time    08/19/13  1:53 AM      Result Value Range Status   MRSA by PCR NEGATIVE  NEGATIVE Final   Comment:            The GeneXpert MRSA Assay (FDA     approved for NASAL specimens     only), is one component of a     comprehensive MRSA colonization     surveillance program. It is not     intended to diagnose MRSA     infection nor to guide or     monitor treatment for     MRSA infections.   Studies/Results: US Soft Tissue Head/neck  08/20/2013    CLINICAL DATA:  Abnormal appearance of the thyroid on recent CT.  EXAM: THYROID ULTRASOUND  TECHNIQUE: Ultrasound examination of the thyroid gland and adjacent soft tissues was performed.  COMPARISON:  Chest CT 08/18/2013  FINDINGS: Right thyroid lobe  Measurements: 4.5 x 1.8 x 1.5 cm. There is a mildly heterogeneous nodule in the medial right thyroid lobe that measures 1.0 x 0.9 x 0.6 cm. No definite calcifications within this nodule.  Left thyroid lobe  Measurements: 8.6 x 4.7 x 4.8 cm. The left thyroid lobe is markedly enlarged and heterogeneous. There is a dominant heterogeneous nodule in the mid left thyroid lobe that roughly measures 3.4 x 5.0 x 4.6 cm. This dominant nodule contains dystrophic calcifications with posterior acoustic shadowing. There is also prominent thyroid tissue that extends inferiorly towards the chest but difficult to evaluate.  Isthmus  Thickness: 0.3 cm.  No nodules visualized.  Lymphadenopathy  None visualized.  IMPRESSION: Left thyroid lobe is markedly enlarged with a dominant heterogeneous lesion containing large calcifications. The dominant lesion measures up to 5 cm.  1 cm nodule in the right thyroid lobe.   Electronically Signed   By: Richarda Overlie M.D.   On: 08/20/2013 17:47   Medications: I have reviewed the patient's current medications. Scheduled Meds: . brimonidine  1 drop Left Eye BID  . dorzolamide  1 drop Left Eye BID  . feeding supplement (GLUCERNA SHAKE)  237 mL Oral Q24H  . insulin aspart  0-9 Units Subcutaneous TID WC  . lisinopril  20 mg Oral Daily  . mometasone-formoterol  2 puff Inhalation BID  . rivaroxaban  15 mg Oral BID   Followed by  . [START ON 09/09/2013] rivaroxaban  20 mg Oral Daily  . sodium chloride  3 mL Intravenous Q12H   Continuous Infusions:  PRN Meds:.albuterol, hydrALAZINE, ondansetron (ZOFRAN) IV, ondansetron Assessment/Plan: Principal Problem:   Pulmonary embolism Active Problems:   DIABETES MELLITUS, TYPE II   HYPERTENSION    CARDIOMYOPATHY, DILATED   Wound of right leg   Atrial fibrillation   Diastolic CHF   Severe protein-calorie malnutrition  Assessment: 80 year old woman with past medical history of afib on xarelto who presented with abdominal pain and diarrhea and found to have submassive pulmonary embolism.   Plan:  Submassive left lower lobe pulmonary embolism complicated by right heart strain - clinically improved. Diagnosed on CTA chest. Etiology unknown, possibly due to noncompliance/ failure of xarelto vs stasis due to right leg trauma vs hypercoagulable state (malignancy).  Pt initially received IV heparin.  -Supplemental oxygen therapy, BiPap if needed to keep SpO2 >92% -Continue xarelto 15 mg BID for 21 days, then 20 mg daily per cardiology recommendations -PT consult    Non-valvular atrial fibrillation with bradycardia  - currently normal HR and normal sinus rhythm. Diagnosed 04/2013. Pt on metoprolol and digoxin for rate control [facility] -Hold metoprolol succinate 150 mg daily in setting of bradycardia   -Hold digoxin 0.125mg  in setting of bradycardia   -Continue xarelto 15 mg BID for 21 days, then 20 mg daily -Monitor on telemetry   Systolic Heart Failure due to dilated cardiomyopathy - 2D-echo on 04/04/13 with EF 20-25%. No evidence of fluid overload on exam.   -Appreciate cardiology recs -2D-echo on 08/19/13 with EF 40-45% with moderate LVH and no wall motion -Monitor daily weight (153 to 151) & I & O's (400mL) -Hold furosemide 80 mg BID  -Continue to monitor volume status   Hypertension - currently normotensive  -Hold furosemide 80 mg BID  -Hold metoprolol succinate 150 mg daily -Continue home lisinopril 20 mg daily  -Hydralazine PRN  Non-insulin dependent Type II DM  - Last A1c of 7.9 on 05/26/13. Pt on metformin at home.  -Hold metformin 1000mg  BID for a least 48 hrs after contrast -Insulin sliding scale with meals -Monitor glucose frequently  Asthma - Pt without recent  exacerbation or use of corticosteroids. Pt on advair and albuterol at home.  -Albuterol PRN acute bronchospasm  -Dulera 2 puffs BID  Left Lobe Thyroid Mass - on CTA chest. TSH normal.   -Obtain Thyroid US -If nodule, will need FNA outpatient in setting of normal TSH (cold nodule)   Hepatic steatosis with scattered hypodense liver lesions - on abdominal CT and lesions also noted on 2001 CT.  - Per curbside GI consult, lesions nonconcerning and based on pt's age will unlikely warrant futther intervention - Consider Hepatic Protocol MRI outpatient if pt would like and can tolerate  Glaucoma  -Opthalmic drops, brimonidine 0.1% and dorzolamide 2% left eye BID  Diet: Renal Code: Full  DVT Ppx: Xarelto   Dispo: Disposition is deferred at this time, awaiting improvement of current medical problems.  Anticipated discharge in approximately 1 day.   The patient does have a current PCP Evelena Peat(Alex Wilson, DO) and does need an [facility] follow[facility] after discharge.  The patient does have transportation limitations that hinder transportation to clinic appointments.  .Services Needed at time of discharge: Y = Yes, Blank = No PT:   OT:   RN:   Equipment:   Other:     LOS: 2 days   Otis BraceMarjan Chayil Gantt, MD 08/20/2013, 6:14 PM

## 2013-08-21 LAB — GLUCOSE, CAPILLARY
GLUCOSE-CAPILLARY: 128 mg/dL — AB (ref 70–99)
GLUCOSE-CAPILLARY: 167 mg/dL — AB (ref 70–99)
Glucose-Capillary: 188 mg/dL — ABNORMAL HIGH (ref 70–99)
Glucose-Capillary: 166 mg/dL — ABNORMAL HIGH (ref 70–99)

## 2013-08-21 MED ORDER — POLYETHYLENE GLYCOL 3350 17 G PO PACK
17.0000 g | PACK | Freq: Once | ORAL | Status: AC
  Administered 2013-08-21: 14:00:00 17 g via ORAL
  Filled 2013-08-21: qty 1

## 2013-08-21 MED ORDER — COLCHICINE 0.6 MG PO TABS
0.6000 mg | ORAL_TABLET | Freq: Every day | ORAL | Status: DC
  Administered 2013-08-22 – 2013-08-23 (×2): 0.6 mg via ORAL
  Filled 2013-08-21 (×2): qty 1

## 2013-08-21 MED ORDER — ACETAMINOPHEN 325 MG PO TABS
325.0000 mg | ORAL_TABLET | Freq: Four times a day (QID) | ORAL | Status: DC | PRN
  Filled 2013-08-21: qty 1

## 2013-08-21 MED ORDER — IBUPROFEN 600 MG PO TABS
600.0000 mg | ORAL_TABLET | Freq: Once | ORAL | Status: DC
  Filled 2013-08-21: qty 1

## 2013-08-21 MED ORDER — COLCHICINE 0.6 MG PO TABS
1.2000 mg | ORAL_TABLET | Freq: Once | ORAL | Status: AC
  Administered 2013-08-21: 1.2 mg via ORAL
  Filled 2013-08-21: qty 2

## 2013-08-21 MED ORDER — ACETAMINOPHEN 325 MG PO TABS
325.0000 mg | ORAL_TABLET | Freq: Once | ORAL | Status: AC
  Administered 2013-08-21: 05:00:00 325 mg via ORAL

## 2013-08-21 NOTE — Progress Notes (Addendum)
Subjective:  [name] was seen and examined at bedside with family in the room as well.  She is complaining of severe right sided knee pain today and unable to work with PT. Her family reports she is slow to ambulate but was up and walking prior to admission and that they have not seen her knee that swollen. She says she has joint pains usually but they improve with rubbing alcohol. Additionally, she does have a history of pseudogout. She has a good appetite today but no BM, although she feels like she has to go. Denies any chest pain or shortness of breath.   Objective: Vital signs in last 24 hours: Filed Vitals:   08/20/13 1957 08/20/13 2119 08/21/13 0550 08/21/13 0930  BP: 111/47  124/51   Pulse: 71  69   Temp: 97.9 F (36.6 C)  97.7 F (36.5 C)   TempSrc: Oral  Oral   Resp: 20  20   Height:      Weight:   150 lb 5.7 oz (68.2 kg)   SpO2: 95% 95% 94% 94%   Weight change: -13.5 oz (-0.384 kg)  Intake/Output Summary (Last 24 hours) at 08/21/13 1331 Last data filed at 08/21/13 1317  Gross per 24 hour  Intake    720 ml  Output    300 ml  Net    420 ml   Constitutional: sitting in chair, painful distress HENT: PERRLA Cardiovascular: RRR Pulmonary/Chest: CTA B/L   Abdomen: Soft. B+bs, non-tender Musculoskeletal: b/l fluctuant supra-patellar edema, left tenderness to palpation, mildly warm to touch, +dried scab RLE Neurological: Alert & Oriented x 3, decreased strength in all extremities due to poor effort 2/2 pain   Skin: Dry  Lab Results: Basic Metabolic Panel:  Recent Labs Lab 08/18/13 1444 08/19/13 0545  NA 141 142  K 4.2 4.5  CL 103 103  CO2 29 25  GLUCOSE 118* 97  BUN 7 7  CREATININE 0.60 0.61  CALCIUM 8.7 8.4   Liver Function Tests:  Recent Labs Lab 08/18/13 1444  AST 13  ALT 5  ALKPHOS 74  BILITOT 0.7  PROT 6.5  ALBUMIN 3.4*    Recent Labs Lab 08/18/13 1444  LIPASE 25   CBC:  Recent Labs Lab 08/18/13 1444 08/19/13 0545  WBC 2.9*  3.3*  NEUTROABS 1.0*  --   HGB 13.5 13.2  HCT 40.8 41.2  MCV 92.7 95.4  PLT 270 241   Cardiac Enzymes:  Recent Labs Lab 08/19/13 0211 08/19/13 0711 08/19/13 1218  TROPONINI <0.30 <0.30 <0.30   CBG:  Recent Labs Lab 08/20/13 0648 08/20/13 1112 08/20/13 1713 08/20/13 2202 08/21/13 0555 08/21/13 1056  GLUCAP 117* 150* 158* 124* 128* 167*   Thyroid Function Tests:  Recent Labs Lab 08/19/13 0545  TSH 1.563   Coagulation:  Recent Labs Lab 08/18/13 1610  LABPROT 15.1  INR 1.22   Urinalysis:  Recent Labs Lab 08/18/13 1640  COLORURINE YELLOW  LABSPEC 1.017  PHURINE 7.0  GLUCOSEU NEGATIVE  HGBUR NEGATIVE  BILIRUBINUR NEGATIVE  KETONESUR NEGATIVE  PROTEINUR NEGATIVE  UROBILINOGEN 0.2  NITRITE NEGATIVE  LEUKOCYTESUR NEGATIVE   Micro Results: Recent Results (from the past 240 hour(s))  MRSA PCR SCREENING     Status: None   Collection Time    08/19/13  1:53 AM      Result Value Range Status   MRSA by PCR NEGATIVE  NEGATIVE Final   Comment:            The  GeneXpert MRSA Assay (FDA     approved for NASAL specimens     only), is one component of a     comprehensive MRSA colonization     surveillance program. It is not     intended to diagnose MRSA     infection nor to guide or     monitor treatment for     MRSA infections.   Studies/Results: US Soft Tissue Head/neck  08/20/2013   CLINICAL DATA:  Abnormal appearance of the thyroid on recent CT.  EXAM: THYROID ULTRASOUND  TECHNIQUE: Ultrasound examination of the thyroid gland and adjacent soft tissues was performed.  COMPARISON:  Chest CT 08/18/2013  FINDINGS: Right thyroid lobe  Measurements: 4.5 x 1.8 x 1.5 cm. There is a mildly heterogeneous nodule in the medial right thyroid lobe that measures 1.0 x 0.9 x 0.6 cm. No definite calcifications within this nodule.  Left thyroid lobe  Measurements: 8.6 x 4.7 x 4.8 cm. The left thyroid lobe is markedly enlarged and heterogeneous. There is a dominant  heterogeneous nodule in the mid left thyroid lobe that roughly measures 3.4 x 5.0 x 4.6 cm. This dominant nodule contains dystrophic calcifications with posterior acoustic shadowing. There is also prominent thyroid tissue that extends inferiorly towards the chest but difficult to evaluate.  Isthmus  Thickness: 0.3 cm.  No nodules visualized.  Lymphadenopathy  None visualized.  IMPRESSION: Left thyroid lobe is markedly enlarged with a dominant heterogeneous lesion containing large calcifications. The dominant lesion measures up to 5 cm.  1 cm nodule in the right thyroid lobe.   Electronically Signed   By: Richarda Overlie M.D.   On: 08/20/2013 17:47   Medications: I have reviewed the patient's current medications. Scheduled Meds: . brimonidine  1 drop Left Eye BID  . dorzolamide  1 drop Left Eye BID  . feeding supplement (GLUCERNA SHAKE)  237 mL Oral Q24H  . ibuprofen  600 mg Oral Once  . insulin aspart  0-9 Units Subcutaneous TID WC  . lisinopril  20 mg Oral Daily  . mometasone-formoterol  2 puff Inhalation BID  . polyethylene glycol  17 g Oral Once  . rivaroxaban  15 mg Oral BID   Followed by  . [START ON 09/09/2013] rivaroxaban  20 mg Oral Daily  . sodium chloride  3 mL Intravenous Q12H   Continuous Infusions:  PRN Meds:.albuterol, hydrALAZINE, ondansetron (ZOFRAN) IV, ondansetron Assessment/Plan: Principal Problem:   Pulmonary embolism Active Problems:   DIABETES MELLITUS, TYPE II   CARDIOMYOPATHY, DILATED   Severe protein-calorie malnutrition   HYPERTENSION   Wound of right leg   Atrial fibrillation   Diastolic CHF   Malnutrition of moderate degree  Assessment: [name] is a 80 year old female with PMH of afib on xarelto admitted for submassive pulmonary embolism.   Submassive left lower lobe pulmonary embolism complicated by right heart strain-- Etiology unclear, possibly due to noncompliance/ failure of xarelto vs stasis due to right leg trauma vs hypercoagulable state  (?malignancy, lesion on liver per imaging). Initially was placed on heparin and converted back to Xarelto.  -Supplemental oxygen therapy, SpO2 >92% -Check pulse oximetry with ambulation -Continue xarelto 15 mg BID for 21 days, then 20 mg daily -PT consult, limited work with PT today due to right knee pain, but out of bed to chair, recommend home health PT since family and patient refuse SNF and she will be living with her daughter who can provide care. She will need wheelchair, hospital bed, and possible hoyer  lift.   Non-valvular atrial fibrillation with bradycardia  - bradycardia improved with HR in 60-70's.  Holding digoxin and Toprol-XL -Will continue to hold BB and Dig in setting of bradycardia -Appreciate cardiology following -Continue xarelto 15 mg BID for 21 days (day 3/21), then 20 mg daily  Systolic Heart Failure due to dilated cardiomyopathy - 2D-echo on 08/19/13 with EF 40-45% with moderate LVH and no wall motion abnormality.  Does not appear volume overloaded at this time, but does have fluid in her knees.  -Appreciate cardiology following, will continue to hold lasix for now, but will have to continue to evaluate fluid status and may need to resume at time of discharge.  -Daily weights, down to 150lb's & I & O's   Pseudogout--appears to have acute flare with R knee pain and swelling. Edema supra-patellar, present in both knees, but R >L. Hx of flare noted in ED visit and per family in June 2014. Unclear if ever treated with colchicine but will proceed with this at this time. If fails, may consider prednisone. Aspiration appear to be difficult at this time due to location of fluid.  -colchicine 1.2mg  x1 and then 0.6mg  1 hour after  Hypertension - Initially hypertensive, now improved with only ACEi at this time. -Holding lasix, digoxin, and BB.   -Continue home lisinopril 20 mg daily   Non-insulin dependent Type II DM  - Last A1c of 7.9 on 05/26/13. Pt on metformin at home.  -Holding  metformin 1000mg  BID for a least 48 hrs after contrast -SSI -CBG monitoring  Asthma - Pt without recent exacerbation or use of corticosteroids.  On advair and albuterol at home.  -Albuterol PRN acute bronchospasm  -Dulera 2 puffs BID  Left thyroid lobe lesion seen on imaging.  TSH WNL 1.563.   -Thyroid US 1/3: L thyroid lobe markedly enlarged with large calcifications. Dominant lesion measuring up to 5cm.  1cm nodule in R thyroid lobe.  Will likely need FNA (most likely outpatient)  Hepatic steatosis with scattered hypodense liver lesions - on abdominal CT and lesions also noted on 2001 CT.  - Consider Hepatic Protocol MRI outpatient if pt would like and can tolerate  Diet: Renal Code: Full  DVT Ppx: Xarelto   Dispo: Disposition is deferred at this time, awaiting improvement of current medical problems.  Anticipated discharge in approximately 1-2 days.  The patient does have a current PCP Evelena Peat, DO) and does need an [facility] hospital follow-up appointment after discharge.  The patient does have transportation limitations that hinder transportation to clinic appointments.  Services Needed at time of discharge: Y = Yes, Blank = No PT: Home health PT  OT:   RN:   Equipment: Hospital bed, wheelxhair cusion, Wheelchair, may need lift  Other:     LOS: 3 days   Darden Palmer, MD 08/21/2013, 1:31 PM

## 2013-08-21 NOTE — Progress Notes (Signed)
   SUBJECTIVE:  She reports that she is breathing better and has no pain.  She wants to walk today.   PHYSICAL EXAM Filed Vitals:   08/20/13 1535 08/20/13 1957 08/20/13 2119 08/21/13 0550  BP: 151/80 111/47  124/51  Pulse: 70 71  69  Temp: 97.6 F (36.4 C) 97.9 F (36.6 C)  97.7 F (36.5 C)  TempSrc: Oral Oral  Oral  Resp: 20 20  20   Height:      Weight:    150 lb 5.7 oz (68.2 kg)  SpO2: 99% 95% 95% 94%   General:  No distress Lungs:  Transmitted upper airway wheezing Heart:  RRR Abdomen:  Positive bowel sounds, no rebound no guarding Extremities:  No edema  LABS: Lab Results  Component Value Date   TROPONINI <0.30 08/19/2013   Results for orders placed during the hospital encounter of 08/18/13 (from the past 24 hour(s))  GLUCOSE, CAPILLARY     Status: Abnormal   Collection Time    08/20/13 11:12 AM      Result Value Range   Glucose-Capillary 150 (*) 70 - 99 mg/dL   Comment 1 Notify RN    GLUCOSE, CAPILLARY     Status: Abnormal   Collection Time    08/20/13  5:13 PM      Result Value Range   Glucose-Capillary 158 (*) 70 - 99 mg/dL   Comment 1 Notify RN    GLUCOSE, CAPILLARY     Status: Abnormal   Collection Time    08/20/13 10:02 PM      Result Value Range   Glucose-Capillary 124 (*) 70 - 99 mg/dL   Comment 1 Notify RN    GLUCOSE, CAPILLARY     Status: Abnormal   Collection Time    08/21/13  5:55 AM      Result Value Range   Glucose-Capillary 128 (*) 70 - 99 mg/dL    Intake/Output Summary (Last 24 hours) at 08/21/13 0823 Last data filed at 08/20/13 1338  Gross per 24 hour  Intake    720 ml  Output      0 ml  Net    720 ml    ASSESSMENT AND PLAN:  BRADYCARDIA:  Atrial fib.  Telemetry reviewed today and no evidence of sustained or symptomatic bradyarrhythmia or pauses.  Rate and BP seem to be good off of dig and beta blocker.  I would continue to hold these meds.    PE:  On Xarelto.    CHRONIC SYSTOLIC HF:  I/O are incomplete.  However, she does not  seem to be volume overloaded.  I would continue to hold the diuretic.  She will likely need some dose of diuretic at discharge.   Fayrene Fearing [facility] 08/21/2013 8:23 AM

## 2013-08-21 NOTE — Evaluation (Signed)
Physical Therapy Evaluation Patient Details Name: [name] MRN: [id number] DOB: October 21, 1925 Today's Date: 08/21/2013 Time: 0160-1093 PT Time Calculation (min): 26 min  PT Assessment / Plan / Recommendation History of Present Illness    [name] is an 80 year old woman with a history of diabetes, hypertension, atrial fibrillation, asthma, and systolic heart failure with an ejection fraction of 20-25% who presents to the emergency department with abdominal pain and diarrhea. A CT scan of the abdomen failed to reveal the cause of the abdominal pain but raised the concern for a left lower lobe pulmonary embolism. A CT angiogram of the chest confirmed a left lower lobe pulmonary embolism and suggested right ventricular strain. Family was hopeful to D/C home with pt today.    Clinical Impression  Pt adm due to above. Presents with decreased independence with functional mobility and transfers secondary to deficits listed below (see PT problem list). Will benefit from skilled PT to address deficits listed below and increase independence with mobility prior to returning home with family to reduce caregiver burden. With 2+ (A) pt was unable to complete sit to stand transfer today due to increased pain in Rt knee and Rt UE. Pt self limiting in therapy. If plans to D/C home with family today will require hoyer lift, wheelchair and hospital bed for mobility. Family is adamant that pt will return home with their (A) and is refusing SNF at this time. RN present and notified of new complaints.   PT Assessment  Patient needs continued PT services    Follow Up Recommendations  Home health PT;Supervision/Assistance - 24 hour;Other (comment) (would benefit from SNF but family adamant to d/c home )    Does the patient have the potential to tolerate intense rehabilitation      Barriers to Discharge        Equipment Recommendations  Hospital bed;Wheelchair cushion (measurements PT);Wheelchair (measurements  PT);Other (comment) (may  need lift depending progress )    Recommendations for Other Services OT consult   Frequency Min 3X/week    Precautions / Restrictions Precautions Precautions: Fall Restrictions Weight Bearing Restrictions: No   Pertinent Vitals/Pain 10/10 in Rt Knee; RN notified.       Mobility  Bed Mobility Bed Mobility: Supine to Sit;Sitting - Scoot to Edge of Bed;Sit to Supine Supine to Sit: HOB elevated;With rails;3: Mod assist Sitting - Scoot to Edge of Bed: 4: Min assist;With rail Sit to Supine: 4: Min assist;HOB flat;With rail Details for Bed Mobility Assistance: pt with difficulty advancing LEs off and back onto bed; requires use of draw pad to bring trunk to upright sitting position; pt c/o pain in Rt knee and Rt arm; cues for hand placement and sequencing  Transfers Transfers: Stand to Sit;Sit to Stand Details for Transfer Assistance: attempted sit to stand transfers x 3 with 2+ (A); incorportated family in session to assess their ability to (A) pt with transfers; pt with decreased ability to participate in transfer secondary to pain in Rt knee and Rt arm; pt was thrusting LEs forward and hips forward and was unable to achieve upright standing position safely. pt is a fall risk and requires a lift. pt tearful with attempts to transfer due to pain and fatigue   Ambulation/Gait Ambulation/Gait Assistance: Not tested (comment) Stairs: No Wheelchair Mobility Wheelchair Mobility: No         PT Diagnosis: Difficulty walking;Generalized weakness;Acute pain  PT Problem List: Decreased strength;Decreased activity tolerance;Decreased balance;Decreased mobility;Decreased knowledge of use of DME;Pain;Decreased safety awareness;Decreased knowledge of  precautions PT Treatment Interventions: DME instruction;Gait training;Stair training;Functional mobility training;Therapeutic activities;Therapeutic exercise;Balance training;Neuromuscular re-education;Patient/family education      PT Goals(Current goals can be found in the care plan section) Acute Rehab PT Goals Patient Stated Goal: I cant go home when im this sick  PT Goal Formulation: With patient/family Time For Goal Achievement: 09/04/13 Potential to Achieve Goals: Fair  Visit Information  Last PT Received On: 08/21/13 Assistance Needed: +2       Prior Functioning  Home Living Family/patient expects to be discharged to:: Private residence Living Arrangements: Children Available Help at Discharge: Family;Available PRN/intermittently Type of Home: House Home Access: Level entry Home Layout: One level Home Equipment: Walker - 2 wheels;Cane - single point;Toilet riser;Bedside commode Additional Comments: per daughter; pt was independent with ADLs prior to admission Prior Function Level of Independence: Independent with assistive device(s) Comments: amb with RW recently due to weakness; able to compelte ADLs and cook without (A)  Communication Communication: No difficulties    Cognition  Cognition Arousal/Alertness: Awake/alert Behavior During Therapy: WFL for tasks assessed/performed Overall Cognitive Status: Within Functional Limits for tasks assessed    Extremity/Trunk Assessment Upper Extremity Assessment Upper Extremity Assessment: Defer to OT evaluation Lower Extremity Assessment Lower Extremity Assessment: Generalized weakness (pt c/o Rt knee pain ) Cervical / Trunk Assessment Cervical / Trunk Assessment: Normal   Balance Balance Balance Assessed: Yes Static Sitting Balance Static Sitting - Balance Support: Feet supported;No upper extremity supported Static Sitting - Level of Assistance: 5: Stand by assistance Static Sitting - Comment/# of Minutes: pt tolerated sitting EOB ~ 10 min   End of Session PT - End of Session Equipment Utilized During Treatment: Gait belt Activity Tolerance: Patient limited by pain;Patient limited by fatigue Patient left: in bed;with call bell/phone  within reach;with nursing/sitter in room;with family/visitor present Nurse Communication: Need for lift equipment;Mobility status  GP     Donnamarie PoagWest, Hadeel Hillebrand [place] , [place] CarolinaPT [phone]  08/21/2013, 2:21 PM

## 2013-08-22 LAB — CBC
HEMATOCRIT: 40.2 % (ref 36.0–46.0)
HEMOGLOBIN: 13.4 g/dL (ref 12.0–15.0)
MCH: 30.5 pg (ref 26.0–34.0)
MCHC: 33.3 g/dL (ref 30.0–36.0)
MCV: 91.4 fL (ref 78.0–100.0)
Platelets: 219 10*3/uL (ref 150–400)
RBC: 4.4 MIL/uL (ref 3.87–5.11)
RDW: 15.7 % — ABNORMAL HIGH (ref 11.5–15.5)
WBC: 4.6 10*3/uL (ref 4.0–10.5)

## 2013-08-22 LAB — BASIC METABOLIC PANEL
BUN: 11 mg/dL (ref 6–23)
CHLORIDE: 99 meq/L (ref 96–112)
CO2: 27 meq/L (ref 19–32)
Calcium: 8.6 mg/dL (ref 8.4–10.5)
Creatinine, Ser: 0.55 mg/dL (ref 0.50–1.10)
GFR calc Af Amer: >90 mL/min (ref >90)
GFR calc non Af Amer: 83 mL/min — ABNORMAL LOW (ref >90)
Glucose, Bld: 129 mg/dL — ABNORMAL HIGH (ref 70–99)
Potassium: 4.1 mEq/L (ref 3.7–5.3)
SODIUM: 137 meq/L (ref 137–147)

## 2013-08-22 LAB — GLUCOSE, CAPILLARY
Glucose-Capillary: 125 mg/dL — ABNORMAL HIGH (ref 70–99)
Glucose-Capillary: 181 mg/dL — ABNORMAL HIGH (ref 70–99)
Glucose-Capillary: 130 mg/dL — ABNORMAL HIGH (ref 70–99)
Glucose-Capillary: 257 mg/dL — ABNORMAL HIGH (ref 70–99)

## 2013-08-22 MED ORDER — FUROSEMIDE 40 MG PO TABS
40.0000 mg | ORAL_TABLET | Freq: Two times a day (BID) | ORAL | Status: DC
  Administered 2013-08-22 – 2013-08-23 (×2): 40 mg via ORAL
  Filled 2013-08-22 (×5): qty 1

## 2013-08-22 MED ORDER — METOPROLOL TARTRATE 25 MG PO TABS
25.0000 mg | ORAL_TABLET | Freq: Two times a day (BID) | ORAL | Status: DC
  Administered 2013-08-22 – 2013-08-23 (×3): 25 mg via ORAL
  Filled 2013-08-22 (×4): qty 1

## 2013-08-22 NOTE — Progress Notes (Signed)
Patient Name: [name] Date of Encounter: 08/22/2013   Principal Problem:   Pulmonary embolism Active Problems:   CARDIOMYOPATHY, DILATED   Atrial fibrillation   DIABETES MELLITUS, TYPE II   HYPERTENSION   Wound of right leg   Acute on chronic combined systolic and diastolic congestive heart failure   Malnutrition of moderate degree   SUBJECTIVE  Feels better after having a BM this morning.  No chest pain or sob.  Afib rates have been mostly in the 70's to 80's.  Rate did climb as high as 140's earlier this morning with activity.  CURRENT MEDS . brimonidine  1 drop Left Eye BID  . colchicine  0.6 mg Oral Daily  . dorzolamide  1 drop Left Eye BID  . feeding supplement (GLUCERNA SHAKE)  237 mL Oral Q24H  . insulin aspart  0-9 Units Subcutaneous TID WC  . lisinopril  20 mg Oral Daily  . mometasone-formoterol  2 puff Inhalation BID  . rivaroxaban  15 mg Oral BID   Followed by  . [START ON 09/09/2013] rivaroxaban  20 mg Oral Daily  . sodium chloride  3 mL Intravenous Q12H   OBJECTIVE  Filed Vitals:   08/21/13 0930 08/21/13 1430 08/21/13 2100 08/22/13 0638  BP:  150/69 148/68 152/94  Pulse:  80 84 88  Temp:  98.1 F (36.7 C) 98.5 F (36.9 C) 98.7 F (37.1 C)  TempSrc:  Oral Oral Oral  Resp:  20 20 20   Height:      Weight:    152 lb 12.5 oz (69.3 kg)  SpO2: 94% 97% 99% 100%    Intake/Output Summary (Last 24 hours) at 08/22/13 1101 Last data filed at 08/22/13 0908  Gross per 24 hour  Intake    600 ml  Output    600 ml  Net      0 ml   Filed Weights   08/20/13 0500 08/21/13 0550 08/22/13 0638  Weight: 151 lb 3.2 oz (68.584 kg) 150 lb 5.7 oz (68.2 kg) 152 lb 12.5 oz (69.3 kg)    PHYSICAL EXAM  General: Pleasant, NAD. Neuro: Alert and oriented X 3. Moves all extremities spontaneously. Psych: Normal affect. HEENT:  Normal  Neck: Supple without bruits or JVD. Lungs:  Resp regular and unlabored, CTA. Heart: RRR no s3, s4, or murmurs. Abdomen: Soft,  non-tender, non-distended, BS + x 4.  Extremities: No clubbing, cyanosis.  Trace bilat LE edema. DP/PT/Radials 1+ and equal bilaterally.  Accessory Clinical Findings  CBC  Recent Labs  08/22/13 0703  WBC 4.6  HGB 13.4  HCT 40.2  MCV 91.4  PLT 219   Basic Metabolic Panel  Recent Labs  08/22/13 0703  NA 137  K 4.1  CL 99  CO2 27  GLUCOSE 129*  BUN 11  CREATININE 0.55  CALCIUM 8.6   Cardiac Enzymes  Recent Labs  08/19/13 1218  TROPONINI <0.30    TELE  afib 70's to 80's with peak in 140's this AM.  Also 5 beats NSVT.  ECG  ---  ASSESSMENT AND PLAN  1.  Acute LLL Pulmonary Embolus:  This occurred while on xarelto 15mg  daily.  That dose was apparently recommended by pharmacy when it was initiated for CVA prophylaxis in the setting of newly dx afib in October.  Her creat clearance was normal at that time.  In light of PE dx, she is currently on 15mg  bid with a plan to transition to 20mg  daily after 21 days.  2.  Acute on chronic combined syst/diast CHF:  Volume stable.  Wt 152 this AM. BB/Digoxin/lasix on hold.  She remains on acei.  Plan to resume lasix on d/c.  3.  Afib/Bradycardia:  Bb/digoxin on hold in setting of HR's in 30's on admission.  No further bradycardia.  Rates generally 70's to 80's.  Prone to tachycardia, with rates into 140's with activity (using bathroom).  Follow with activity.  May need low dose bb.  Anticoagulation with Xarelto (PE dosing at present).  4.  HTN:  bp stable in 140's to 150's.  Would try to lower further.  Cont acei.  5.  Pseudogout:  Significant r knee swelling and pain.  Treatment per IM.  6. DM II:  Per IM.  Metformin on hold in setting of recent contrast exposure.  7.  L Thyroid lesion:  Will require outpt eval/FNA.  Would not be able to come off of xarelto for potential procedures at this time in setting of #1.  Signed, [name] As above, patient seen and examined. She denies chest pain or dyspnea.  Continue xeralto for pulmonary embolus. She remains in permanent atrial fibrillation. Her heart rate is increased with activity at times. I will add Lopressor 25 mg by mouth twice a day and follow on telemetry. Resume Lasix 40 mg by mouth twice a day and follow renal function. [name]

## 2013-08-22 NOTE — Progress Notes (Signed)
ANTICOAGULATION CONSULT NOTE - Initial Consult  Pharmacy Consult for Xarelto  Indication: pulmonary embolus  No Known Allergies  Patient Measurements: Height: 5\' 5"  (165.1 cm) Weight: 152 lb 12.5 oz (69.3 kg) IBW/kg (Calculated) : 57  Vital Signs: Temp: 98.7 F (37.1 C) (01/05 0638) Temp src: Oral (01/05 0638) BP: 152/94 mmHg (01/05 7062) Pulse Rate: 88 (01/05 0638)  Labs:  Recent Labs  08/22/13 0703  HGB 13.4  HCT 40.2  PLT 219  CREATININE 0.55    Estimated Creatinine Clearance: 50.2 ml/min (by C-G formula based on Cr of 0.55).   Medical History: Past Medical History  Diagnosis Date  . Hypertension   . Diabetes mellitus   . Asthma   . Osteoarthritis     left shoulder  . Pseudogout   . Hypokalemia     a. Previously felt due to diuretics, req supplementation.  . Churg-Strauss syndrome     a. Sural nerve biopsy 02/2000.  Darene Lamer disease     a. s/p thyroidectomy.  . Glaucoma   . Hypercholesteremia   . Venous insufficiency   . Osteopenia     DEXA 2004  . Lipoma     left inner thigh  . Cataract     left eye  . VASCULITIS   . Chronic combined systolic and diastolic heart failure     a. 10/7626 EF:20-25%  . Pleural effusion, left     a. Thoracentesis 2001 - per notes, no malignant cells, was transudative.  Marland Kitchen NSVT (nonsustained ventricular tachycardia)     a. During CHF adm 2001.  . Multifocal atrial tachycardia     a. Documented as OP 01/2012.  Marland Kitchen Ectopic atrial tachycardia     a. Documented on tele as IP 02/2012.  . Pulmonary HTN     a. Mod by echo 02/2012.  . Valvular heart disease     a. Echo 02/2012: mod MR/TR, mild AI.  Marland Kitchen Pericardial effusion     a. Echo 02/2012: small-mod pericardial effusion.  . Atrial fibrillation, permanent     a. Dx 04/2013, on metoprolol, dig and xarelto    Medications:  Scheduled:  . brimonidine  1 drop Left Eye BID  . colchicine  0.6 mg Oral Daily  . dorzolamide  1 drop Left Eye BID  . feeding supplement (GLUCERNA  SHAKE)  237 mL Oral Q24H  . furosemide  40 mg Oral BID  . insulin aspart  0-9 Units Subcutaneous TID WC  . lisinopril  20 mg Oral Daily  . metoprolol tartrate  25 mg Oral BID  . mometasone-formoterol  2 puff Inhalation BID  . rivaroxaban  15 mg Oral BID   Followed by  . [START ON 09/09/2013] rivaroxaban  20 mg Oral Daily  . sodium chloride  3 mL Intravenous Q12H   Infusions:     Assessment: 80 yo F presented to ER with diarrhea and weakness found to have a PE per CT scan of chest.  Patient's SCr is 0.55 with estimated CrCl~46 (if round SCr to 1, then CrCl~43.5).  Of note, renal adjustments for Xarelto are different depending on diagnosis.  Now that patient has PE, she will not get an adjusted dose at current kidney function.  Goal of Therapy:  Full anticoagulation on PO Xarelto Monitor platelets by anticoagulation protocol: Yes   Plan:   Cont Xarelto 15mg  BID x21 days then 20mg  qday

## 2013-08-22 NOTE — Progress Notes (Signed)
Physical Therapy Treatment Patient Details Name: [name] MRN: [id number] DOB: 11/28/1925 Today's Date: 08/22/2013 Time: 3500-9381 PT Time Calculation (min): 24 min  PT Assessment / Plan / Recommendation  History of Present Illness Pt adm with CHF.   PT Comments   Pt improved mobility this session, able to stand from elevated bed with max A.  Pt will need +2 assist at home, would benefit from SNF to decrease burden of care.  Follow Up Recommendations  Home health PT;Supervision/Assistance - 24 hour;Other (comment) (pt will benefit from SNF)     Does the patient have the potential to tolerate intense rehabilitation     Barriers to Discharge        Equipment Recommendations  Other (comment) (TBD)    Recommendations for Other Services    Frequency Min 3X/week   Progress towards PT Goals Progress towards PT goals: Goals downgraded-see care plan  Plan Current plan remains appropriate    Precautions / Restrictions Precautions Precautions: Fall Restrictions Weight Bearing Restrictions: No   Pertinent Vitals/Pain Pt c/o R knee pain with wt bearing and knee flexion, eased with rest    Mobility  Bed Mobility Supine to Sit: 3: Mod assist Sitting - Scoot to Edge of Bed: 2: Max assist Sit to Supine: 3: Mod assist Details for Bed Mobility Assistance: pt requires assist for LEs in and out of bed, assist for lifting trunk for supine to sit.  Seated edge of bed worked on wt shifting laterally and forward to scoot bottom backwards on bed, pt requires max A and max cuing, delayed processing noted Transfers Transfers: Sit to Stand;Stand to Sit Sit to Stand: 2: Max assist Stand to Sit: 2: Max assist Details for Transfer Assistance: multiple attempts sit to stand with bed elevated and therapist blocking pt's feet so they would not slide.  pt with decreased knee flexion which makes it difficult to get her feet under her body to stand.  multiple repetitions of forward lean to assist with  transfer with mod manual facilitation.  Pt able to stand 3 x 10 seconds, B LEs tremor due to weakness.  Pt reports she has R knee pain with standing and attempting to bend R knee    Exercises     PT Diagnosis:    PT Problem List:   PT Treatment Interventions:     PT Goals (current goals can now be found in the care plan section)    Visit Information  Assistance Needed: +1 (+2 for OOB) History of Present Illness: Pt adm with CHF.    Subjective Data      Cognition  Cognition Arousal/Alertness: Awake/alert Behavior During Therapy: WFL for tasks assessed/performed Overall Cognitive Status: Within Functional Limits for tasks assessed    Balance     End of Session PT - End of Session Equipment Utilized During Treatment: Gait belt Activity Tolerance: Patient limited by pain;Patient limited by fatigue Patient left: in bed;with call bell/phone within reach Nurse Communication: Mobility status   GP     Cleto Claggett 08/22/2013, 12:47 PM

## 2013-08-22 NOTE — Progress Notes (Signed)
  Date: 08/22/2013  Patient name: [name]  Medical record number: [id number]  Date of birth: March 01, 1926   This patient has been seen and the plan of care was discussed with the house staff. Please see their note for complete details. I concur with their findings with the following additions/corrections:  [name] is improved today.  She is on xarelto for her PE.  Her flare of pseudogout is improved with colchicine.  She should be ready for discharge today if all home health needs can be addressed.    Inez Catalina, MD 08/22/2013, 2:54 PM

## 2013-08-22 NOTE — ED Provider Notes (Signed)
Medical screening examination/treatment/procedure(s) were conducted as a shared visit with non-physician practitioner(s) and myself.  I personally evaluated the patient during the encounter.  EKG Interpretation    Date/Time:  Thursday August 18 2013 14:59:54 EST Ventricular Rate:  62 PR Interval:    QRS Duration: 99 QT Interval:  426 QTC Calculation: 433 R Axis:   -86 Text Interpretation:  Atrial fibrillation Inferior infarct, old Abnormal lateral Q waves Anterior infarct, old No significant change since last tracing Confirmed by Wen Munford  MD, Miguel Medal ([id number]) on 08/18/2013 3:07:22 PM             Shelda Jakes, MD 08/22/13 [phone]

## 2013-08-22 NOTE — Evaluation (Signed)
Occupational Therapy Evaluation and Discharge Patient Details Name: [name] MRN: [id number] DOB: 1926-08-17 Today's Date: 08/22/2013 Time: 6222-9798 OT Time Calculation (min): 19 min  OT Assessment / Plan / Recommendation History of present illness Pt adm with CHF.   Clinical Impression   This 80 yo female admitted with above presents to acute OT with extreme decreased mobility for in/OOB, sit<>stand, and transfers; decreased sitting balance all affecting pt's ability to perform BADLs. Feel best place would be SNF level to therapies; however it seems that family want to take patient home with 24 hour care per pt. Will defer remainder of OT to [place]. Acute OT will sign off.    OT Assessment  All further OT needs can be met in the next venue of care    Follow Up Recommendations  Home health OT                Precautions / Restrictions Precautions Precautions: Fall Restrictions Weight Bearing Restrictions: No   Pertinent Vitals/Pain 6/10 FACES; repositioned    ADL  Eating/Feeding: Set up;Supervision/safety Where Assessed - Eating/Feeding: Bed level Grooming: Minimal assistance Where Assessed - Grooming: Supine, head of bed up Upper Body Bathing: +1 Total assistance Where Assessed - Upper Body Bathing: Supine, head of bed up Lower Body Bathing: +1 Total assistance Where Assessed - Lower Body Bathing: Supported sit to stand Upper Body Dressing: +1 Total assistance Where Assessed - Upper Body Dressing: Supported sitting Lower Body Dressing: +1 Total assistance Where Assessed - Lower Body Dressing: Supported sit to Lobbyist: Maximal assistance Toilet Transfer Method: Arts development officer: Therapist, occupational and Hygiene: +1 Total assistance (with additional one to help her maintain standing) Where Assessed - Toileting Clothing Manipulation and Hygiene: Sit to stand from 3-in-1 or toilet Equipment Used: Gait  belt Transfers/Ambulation Related to ADLs: Min A sit>stand; Mod A stand>sit    OT Diagnosis: Generalized weakness;Cognitive deficits;Acute pain  OT Problem List: Decreased strength;Decreased range of motion;Decreased activity tolerance;Impaired balance (sitting and/or standing);Pain;Decreased knowledge of use of DME or AE;Decreased safety awareness   Acute Rehab OT Goals Patient Stated Goal: home  Visit Information  Last OT Received On: 08/22/13 Assistance Needed: +2 (for OOB) History of Present Illness: Pt adm with CHF.       Prior [place] expects to be discharged to:: Private residence Living Arrangements: Children Available Help at Discharge: Family;Available 24 hours/day (per pt, she is going to move in with her daughter and her grand-daughter will be taking care of her) Type of Home: House Home Access: Stairs to enter [employer] of Steps: 2 Entrance Stairs-Rails: None Home Layout: One level Home Equipment: Walker - 2 wheels;Cane - single point;Toilet riser;Bedside commode Additional Comments: per daughter; pt was independent with ADLs prior to admission Prior Function Level of Independence: Independent with assistive device(s) Comments: amb with RW recently due to weakness; able to compelte ADLs and cook without (A)  Communication Communication: No difficulties Dominant Hand: Right         Vision/Perception Vision - History Patient Visual Report: No change from baseline   Cognition  Cognition Arousal/Alertness: Awake/alert Behavior During Therapy: WFL for tasks assessed/performed Overall Cognitive Status: Within Functional Limits for tasks assessed    Extremity/Trunk Assessment Upper Extremity Assessment Upper Extremity Assessment: Generalized weakness     Mobility Bed Mobility Bed Mobility: Supine to Sit;Sitting - Scoot to Edge of Bed;Sit to Supine Supine to Sit: 3: Mod assist;With  rails;HOB  elevated Sitting - Scoot to Edge of Bed: 1: +1 Total assist Sit to Supine: 1: +2 Total assist;HOB flat Details for Bed Mobility Assistance: pt requires assist for LEs in and out of bed, assist for lifting trunk for supine to sit.   Transfers Transfers: Sit to Stand;Stand to Sit Sit to Stand: 4: Min assist;With upper extremity assist;From bed Stand to Sit: 3: Mod assist;With upper extremity assist;To chair/3-in-1        Balance Balance Balance Assessed: Yes Static Sitting Balance Static Sitting - Balance Support: Feet supported;Bilateral upper extremity supported Static Sitting - Level of Assistance: 1: +1 Total assist (posterior lean, had to keep cueing her and physcially A her to come stay forward) Static Sitting - Comment/# of Minutes: 3 minutes   End of Session OT - End of Session Equipment Utilized During Treatment: Gait belt Activity Tolerance: Patient limited by fatigue;Patient limited by pain Patient left: in bed;with call bell/phone within reach;with bed alarm set Nurse Communication:  (NT in room to A me with getting pt back to bed)       Almon Register [phone] 08/22/2013, 3:53 PM

## 2013-08-22 NOTE — Progress Notes (Signed)
Pt to discharge to daughter [name]) address [address], [place], [place] [address]. [phone] (h) [phone]. Henli Hey J. Lucretia Roers, RN, BSN, [employer] [phone].

## 2013-08-22 NOTE — Progress Notes (Addendum)
Subjective:  Pt seen and examined in AM. No acute events overnight. Pt states she feels well after finally having a BM.  She is no longer weak. Her right knee pain has improved with less swelling and warmth. She denies fever, chills, cough, dyspnea, chest pain, lightheadedness, leg swelling, nausea, vomiting, abdominal pain, or change in urination.      Objective: Vital signs in last 24 hours: Filed Vitals:   08/21/13 0930 08/21/13 1430 08/21/13 2100 08/22/13 0638  BP:  150/69 148/68 152/94  Pulse:  80 84 88  Temp:  98.1 F (36.7 C) 98.5 F (36.9 C) 98.7 F (37.1 C)  TempSrc:  Oral Oral Oral  Resp:  20 20 20   Height:      Weight:    69.3 kg (152 lb 12.5 oz)  SpO2: 94% 97% 99% 100%   Weight change: 1.1 kg (2 lb 6.8 oz)  Intake/Output Summary (Last 24 hours) at 08/22/13 1332 Last data filed at 08/22/13 1221  Gross per 24 hour  Intake    240 ml  Output    900 ml  Net   -660 ml   Constitutional: No distress.  HENT:  Head: Normocephalic and atraumatic.  Cardiovascular: Normal rate, irregular rhythm, murmur present  Pulmonary/Chest: She is in not in respiiatory distress.  No wheezing, ronchi, or rales.  Abdomen: Soft. Bowel sounds are normal. She exhibits no distension. There is no tenderness.  Musculoskeletal: She exhibits right knee swelling and warmth with mild tenderness to palpation  Neurological: Alert & Oriented x 3   Skin: Skin is dry. She is not diaphoretic   Lab Results: Basic Metabolic Panel:  Recent Labs Lab 08/19/13 0545 08/22/13 0703  NA 142 137  K 4.5 4.1  CL 103 99  CO2 25 27  GLUCOSE 97 129*  BUN 7 11  CREATININE 0.61 0.55  CALCIUM 8.4 8.6   Liver Function Tests:  Recent Labs Lab 08/18/13 1444  AST 13  ALT 5  ALKPHOS 74  BILITOT 0.7  PROT 6.5  ALBUMIN 3.4*    Recent Labs Lab 08/18/13 1444  LIPASE 25   No results found for this basename: AMMONIA,  in the last 168 hours CBC:  Recent Labs Lab 08/18/13 1444 08/19/13 0545  08/22/13 0703  WBC 2.9* 3.3* 4.6  NEUTROABS 1.0*  --   --   HGB 13.5 13.2 13.4  HCT 40.8 41.2 40.2  MCV 92.7 95.4 91.4  PLT 270 241 219   Cardiac Enzymes:  Recent Labs Lab 08/19/13 0211 08/19/13 0711 08/19/13 1218  TROPONINI <0.30 <0.30 <0.30   BNP: No results found for this basename: PROBNP,  in the last 168 hours D-Dimer: No results found for this basename: DDIMER,  in the last 168 hours CBG:  Recent Labs Lab 08/20/13 2202 08/21/13 0555 08/21/13 1056 08/21/13 1619 08/21/13 2203 08/22/13 0624  GLUCAP 124* 128* 167* 188* 166* 125*   Hemoglobin A1C: No results found for this basename: HGBA1C,  in the last 168 hours Fasting Lipid Panel: No results found for this basename: CHOL, HDL, LDLCALC, TRIG, CHOLHDL, LDLDIRECT,  in the last 168 hours Thyroid Function Tests:  Recent Labs Lab 08/19/13 0545  TSH 1.563   Coagulation:  Recent Labs Lab 08/18/13 1610  LABPROT 15.1  INR 1.22   Anemia Panel: No results found for this basename: VITAMINB12, FOLATE, FERRITIN, TIBC, IRON, RETICCTPCT,  in the last 168 hours Urine Drug Screen: Drugs of Abuse  No results found for this basename: labopia,  cocainscrnur,  labbenz,  amphetmu,  thcu,  labbarb    Alcohol Level: No results found for this basename: ETH,  in the last 168 hours Urinalysis:  Recent Labs Lab 08/18/13 1640  COLORURINE YELLOW  LABSPEC 1.017  PHURINE 7.0  GLUCOSEU NEGATIVE  HGBUR NEGATIVE  BILIRUBINUR NEGATIVE  KETONESUR NEGATIVE  PROTEINUR NEGATIVE  UROBILINOGEN 0.2  NITRITE NEGATIVE  LEUKOCYTESUR NEGATIVE    Micro Results: Recent Results (from the past 240 hour(s))  MRSA PCR SCREENING     Status: None   Collection Time    08/19/13  1:53 AM      Result Value Range Status   MRSA by PCR NEGATIVE  NEGATIVE Final   Comment:            The GeneXpert MRSA Assay (FDA     approved for NASAL specimens     only), is one component of a     comprehensive MRSA colonization     surveillance  program. It is not     intended to diagnose MRSA     infection nor to guide or     monitor treatment for     MRSA infections.   Studies/Results: US Soft Tissue Head/neck  08/20/2013   CLINICAL DATA:  Abnormal appearance of the thyroid on recent CT.  EXAM: THYROID ULTRASOUND  TECHNIQUE: Ultrasound examination of the thyroid gland and adjacent soft tissues was performed.  COMPARISON:  Chest CT 08/18/2013  FINDINGS: Right thyroid lobe  Measurements: 4.5 x 1.8 x 1.5 cm. There is a mildly heterogeneous nodule in the medial right thyroid lobe that measures 1.0 x 0.9 x 0.6 cm. No definite calcifications within this nodule.  Left thyroid lobe  Measurements: 8.6 x 4.7 x 4.8 cm. The left thyroid lobe is markedly enlarged and heterogeneous. There is a dominant heterogeneous nodule in the mid left thyroid lobe that roughly measures 3.4 x 5.0 x 4.6 cm. This dominant nodule contains dystrophic calcifications with posterior acoustic shadowing. There is also prominent thyroid tissue that extends inferiorly towards the chest but difficult to evaluate.  Isthmus  Thickness: 0.3 cm.  No nodules visualized.  Lymphadenopathy  None visualized.  IMPRESSION: Left thyroid lobe is markedly enlarged with a dominant heterogeneous lesion containing large calcifications. The dominant lesion measures up to 5 cm.  1 cm nodule in the right thyroid lobe.   Electronically Signed   By: Richarda Overlie M.D.   On: 08/20/2013 17:47   Medications: I have reviewed the patient's current medications. Scheduled Meds: . brimonidine  1 drop Left Eye BID  . colchicine  0.6 mg Oral Daily  . dorzolamide  1 drop Left Eye BID  . feeding supplement (GLUCERNA SHAKE)  237 mL Oral Q24H  . furosemide  40 mg Oral BID  . insulin aspart  0-9 Units Subcutaneous TID WC  . lisinopril  20 mg Oral Daily  . metoprolol tartrate  25 mg Oral BID  . mometasone-formoterol  2 puff Inhalation BID  . rivaroxaban  15 mg Oral BID   Followed by  . [START ON 09/09/2013]  rivaroxaban  20 mg Oral Daily  . sodium chloride  3 mL Intravenous Q12H   Continuous Infusions:  PRN Meds:.albuterol, ondansetron (ZOFRAN) IV, ondansetron Assessment/Plan: Principal Problem:   Pulmonary embolism Active Problems:   DIABETES MELLITUS, TYPE II   HYPERTENSION   CARDIOMYOPATHY, DILATED   Wound of right leg   Acute on chronic combined systolic and diastolic congestive heart failure   Atrial fibrillation  Malnutrition of moderate degree  Assessment: 80 year old woman with past medical history of afib on xarelto who presented with abdominal pain and diarrhea and found to have submassive pulmonary embolism.   Plan:  Submassive left lower lobe pulmonary embolism complicated by right heart strain - clinically improved. Etiology unknown, possibly due to noncompliance/ failure of xarelto vs stasis due to right leg trauma vs hypercoagulable state (malignancy). Pt initially received IV heparin.  -Supplemental oxygen therapy, BiPap if needed to keep SpO2 >92% -Continue xarelto 15 mg BID for 21 days, then 20 mg daily per cardiology recommendations -PT/OT consult  --> home health PT, hospital bed, wheelchair cushion, wheelchair    Non-valvular atrial fibrillation with bradycardia  - currently in afib, HR wnl with peak to 140's this AM. Also 5 beats NSVT.  Pt at home on metoprolol and digoxin for rate control and xarelto for [facility]. -Hold metoprolol succinate 150 mg daily in setting of bradycardia   -Hold digoxin 0.125mg  in setting of bradycardia   -Per cardiology start lopressor 25 mg BID and follow on telemetry  -Continue xarelto 15 mg BID for 21 days, then 20 mg daily -Monitor on telemetry   Pseudogout -- improving s/p colchicine (NSAIDs contraindicated in setting of xarelto) -colchicine 0.6mg  daily Day 2  Systolic Heart Failure due to dilated cardiomyopathy - 2D-echo on 04/04/13 with EF 20-25%. No evidence of fluid overload on exam.   -Appreciate cardiology recs -2D-echo on 08/19/13  with EF 40-45% with moderate LVH and no wall motion -Monitor daily weight (153 to 152 lb) & I & O's (-650 mL) -Per cardiology, start furosemide 40 mg BID  -Continue to monitor volume status   Hypertension - currently hypertensive  -Per cardiology, start furosemide 40 mg BID  -Per cardiology start lopressor 25 mg BID   -Continue home lisinopril 20 mg daily  -Hydralazine PRN  Non-insulin dependent Type II DM  - Last A1c of 7.9 on 05/26/13. Pt on metformin at home.  -Hold metformin 1000mg  BID for a least 48 hrs after contrast -Insulin sliding scale with meals -Monitor glucose frequently  Asthma - Pt without recent exacerbation or use of corticosteroids. Pt on advair and albuterol at home.  -Albuterol PRN acute bronchospasm  -Dulera 2 puffs BID  Left Lobe Thyroid Mass - on CTA chest. TSH normal.   -Obtain Thyroid US1/3: L thyroid lobe markedly enlarged with large calcifications. Dominant lesion measuring up to 5cm. 1cm nodule in R thyroid lobe.  -Will need FNA outpatient in setting of euthyroid nodule, however not on  xarelto   Hepatic steatosis with scattered hypodense liver lesions - on abdominal CT and lesions also noted on 2001 CT.  - Per curbside GI consult, lesions nonconcerning and based on pt's age will unlikely warrant further intervention - Consider Hepatic Protocol MRI outpatient if pt would like and can tolerate, however not on xarelto   Glaucoma  -Opthalmic drops, brimonidine 0.1% and dorzolamide 2% left eye BID  Diet: Renal Code: Full  DVT Ppx: Xarelto   Dispo: Disposition is deferred at this time, awaiting improvement of current medical problems.  Anticipated discharge in approximately 1 day.   The patient does have a current PCP Evelena Peat, DO) and does need an [facility] hospital follow-up appointment after discharge.  The patient does have transportation limitations that hinder transportation to clinic appointments.  .Services Needed at time of discharge: Y = Yes,  Blank = No PT:   OT:   RN:   Equipment:   Other:  LOS: 4 days   Otis BraceMarjan Deshunda Thackston, MD 08/22/2013, 1:32 PM

## 2013-08-23 ENCOUNTER — Inpatient Hospital Stay (HOSPITAL_COMMUNITY): Admission: RE | Admit: 2013-08-23 | Payer: PRIVATE HEALTH INSURANCE | Source: Ambulatory Visit

## 2013-08-23 LAB — GLUCOSE, CAPILLARY
GLUCOSE-CAPILLARY: 162 mg/dL — AB (ref 70–99)
Glucose-Capillary: 223 mg/dL — ABNORMAL HIGH (ref 70–99)
Glucose-Capillary: 153 mg/dL — ABNORMAL HIGH (ref 70–99)

## 2013-08-23 LAB — BASIC METABOLIC PANEL
BUN: 15 mg/dL (ref 6–23)
CHLORIDE: 97 meq/L (ref 96–112)
CO2: 29 meq/L (ref 19–32)
Calcium: 8.6 mg/dL (ref 8.4–10.5)
Creatinine, Ser: 0.53 mg/dL (ref 0.50–1.10)
GFR calc Af Amer: >90 mL/min (ref >90)
GFR, EST NON AFRICAN AMERICAN: 84 mL/min — AB (ref >90)
Glucose, Bld: 203 mg/dL — ABNORMAL HIGH (ref 70–99)
Potassium: 3.5 mEq/L — ABNORMAL LOW (ref 3.7–5.3)
SODIUM: 138 meq/L (ref 137–147)

## 2013-08-23 LAB — MAGNESIUM: Magnesium: 2 mg/dL (ref 1.5–2.5)

## 2013-08-23 MED ORDER — FUROSEMIDE 80 MG PO TABS: 40.0000 mg | ORAL_TABLET | Freq: Two times a day (BID) | ORAL | Status: DC

## 2013-08-23 MED ORDER — POTASSIUM CHLORIDE CRYS ER 20 MEQ PO TBCR
40.0000 meq | EXTENDED_RELEASE_TABLET | Freq: Once | ORAL | Status: AC
  Administered 2013-08-23: 10:00:00 40 meq via ORAL
  Filled 2013-08-23: qty 2

## 2013-08-23 MED ORDER — RIVAROXABAN 20 MG PO TABS: 20.0000 mg | ORAL_TABLET | Freq: Every day | ORAL | Status: DC

## 2013-08-23 MED ORDER — LISINOPRIL 20 MG PO TABS: 20.0000 mg | ORAL_TABLET | Freq: Every day | ORAL | Status: DC

## 2013-08-23 MED ORDER — METOPROLOL SUCCINATE ER 50 MG PO TB24
50.0000 mg | ORAL_TABLET | Freq: Every day | ORAL | Status: DC
  Administered 2013-08-23: 50 mg via ORAL
  Filled 2013-08-23: qty 1

## 2013-08-23 MED ORDER — POTASSIUM CHLORIDE ER 10 MEQ PO CPCR: 20.0000 meq | ORAL_CAPSULE | Freq: Every day | ORAL | Status: DC

## 2013-08-23 MED ORDER — RIVAROXABAN 15 MG PO TABS: 15.0000 mg | ORAL_TABLET | Freq: Two times a day (BID) | ORAL | Status: DC

## 2013-08-23 MED ORDER — POTASSIUM CHLORIDE CRYS ER 20 MEQ PO TBCR
20.0000 meq | EXTENDED_RELEASE_TABLET | Freq: Every day | ORAL | Status: DC
  Administered 2013-08-23: 20 meq via ORAL
  Filled 2013-08-23: qty 1

## 2013-08-23 MED ORDER — METOPROLOL SUCCINATE ER 50 MG PO TB24: 50.0000 mg | ORAL_TABLET | Freq: Every day | ORAL | Status: DC

## 2013-08-23 MED ORDER — COLCHICINE 0.6 MG PO TABS: 0.6000 mg | ORAL_TABLET | Freq: Every day | ORAL | Status: DC

## 2013-08-23 NOTE — Discharge Instructions (Signed)
-  Start taking Xarelto 15 mg twice a day starting tomm 1/7 for 16 more days until 09/08/13 then take 20 mg daily after that starting 09/09/13 -Change lasix from 80 mg twice a day to 40 mg twice a day -Continue taking 20 mg lisinopril dailly -STOP taking digoxin  -Change Toprol-XL 150 mg daily to 50 mg daily  -Change potassium from 20 mg twice a day to 20 mg daily  -Take colchicine 0.6 mg daily starting tomm 1/7 for 3 days then stop  -Please attend your two follow-up appointments

## 2013-08-23 NOTE — Progress Notes (Addendum)
Patient Name: [name] Date of Encounter: 08/23/2013   Principal Problem:   Pulmonary embolism Active Problems:   CARDIOMYOPATHY, DILATED   Atrial fibrillation   DIABETES MELLITUS, TYPE II   HYPERTENSION   Wound of right leg   Acute on chronic combined systolic and diastolic congestive heart failure   Malnutrition of moderate degree    SUBJECTIVE  Sleepy this morning.  No chest pain or sob.  CURRENT MEDS . brimonidine  1 drop Left Eye BID  . colchicine  0.6 mg Oral Daily  . dorzolamide  1 drop Left Eye BID  . feeding supplement (GLUCERNA SHAKE)  237 mL Oral Q24H  . furosemide  40 mg Oral BID  . insulin aspart  0-9 Units Subcutaneous TID WC  . lisinopril  20 mg Oral Daily  . metoprolol tartrate  25 mg Oral BID  . mometasone-formoterol  2 puff Inhalation BID  . rivaroxaban  15 mg Oral BID   Followed by  . [START ON 09/09/2013] rivaroxaban  20 mg Oral Daily  . sodium chloride  3 mL Intravenous Q12H   OBJECTIVE  Filed Vitals:   08/22/13 0638 08/22/13 1416 08/22/13 1952 08/23/13 0542  BP: 152/94 118/64 127/61 138/57  Pulse: 88 76 76 78  Temp: 98.7 F (37.1 C) 98.6 F (37 C) 98.8 F (37.1 C) 98.6 F (37 C)  TempSrc: Oral Oral Oral Oral  Resp: 20 20 20 20   Height:      Weight: 152 lb 12.5 oz (69.3 kg)     SpO2: 100% 98% 97% 97%    Intake/Output Summary (Last 24 hours) at 08/23/13 0645 Last data filed at 08/23/13 0600  Gross per 24 hour  Intake    840 ml  Output    652 ml  Net    188 ml   Filed Weights   08/20/13 0500 08/21/13 0550 08/22/13 0638  Weight: 151 lb 3.2 oz (68.584 kg) 150 lb 5.7 oz (68.2 kg) 152 lb 12.5 oz (69.3 kg)    PHYSICAL EXAM  General: Pleasant, NAD. Neuro: Alert and oriented X 3. Moves all extremities spontaneously. Psych: Normal affect. HEENT:  Normal  Neck: Supple without bruits or JVD. Lungs:  Resp regular and unlabored, CTA. Heart: IR, IR, no s3, s4, 3/6 syst murmur throughout, loudest @ apex. Abdomen: Soft, non-tender,  non-distended, BS + x 4.  Extremities: No clubbing, cyanosis.  Trace bilat LE edema. DP/PT/Radials 1+ and equal bilaterally.  Accessory Clinical Findings  CBC  Recent Labs  08/22/13 0703  WBC 4.6  HGB 13.4  HCT 40.2  MCV 91.4  PLT 219   Basic Metabolic Panel  Recent Labs  08/22/13 0703  NA 137  K 4.1  CL 99  CO2 27  GLUCOSE 129*  BUN 11  CREATININE 0.55  CALCIUM 8.6   TELE  Afib, 70's.  No significant pauses.  ASSESSMENT AND PLAN  1. Acute LLL Pulmonary Embolus: This occurred while on xarelto 15mg  daily. That dose was apparently recommended by pharmacy when it was initiated for CVA prophylaxis in the setting of newly dx afib in October. Her creat clearance was normal at that time. In light of PE dx, she is currently on 15mg  bid with a plan to transition to 20mg  daily after 21 days.   2. Acute on chronic combined syst/diast CHF: Volume stable. Euvolemic on exam.  Low-dose bb resumed yesterday.  No significant pauses or prolonged bradycardia.  Lasix 40mg  bid also resumed.  Cont ACEI.  BMET pending.  3. Afib/Bradycardia: Bb/digoxin held in setting of HR's in 30's on admission. Low dose bb resumed yesterday.  Rates stable.  No further tachycardia.  Anticoagulation with Xarelto (PE dosing at present).   4. HTN: bp stable.  5. Pseudogout: Significant r knee swelling and pain. Treatment per IM.   6. DM II: Per IM. Metformin on hold in setting of recent contrast exposure.   7. L Thyroid lesion: Will require outpt eval/FNA. Would not be able to come off of xarelto for potential procedures at this time in setting of #1.  Signed, [name] As above; patient seen and examined; no dyspnea or chest pain; continue xeralto for pulmonary embolus; patient remains in atrial fibrillation; HR stable on lower dose lopressor; change to toprol 50 mg daily; lasix resumed at 40 BID; add KCL 20 meq po daily; check BMET one week afer DC; will need fu with Dr Delton SeeNelson 1-2 weeks after  DC. 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 Olga MillersBrian Crenshaw '

## 2013-08-23 NOTE — Progress Notes (Addendum)
Subjective:  Pt seen and examined in AM. No acute events overnight. Pt reports right knee pain is sore but pain is improved and was able to bear weight yesterday. She denies dyspnea, chest pain, palpitations, nausea, vomiting, abdominal pain, lightheadedness, headache, change in BM or urination.     Objective: Vital signs in last 24 hours: Filed Vitals:   08/22/13 1416 08/22/13 1952 08/23/13 0542 08/23/13 0815  BP: 118/64 127/61 138/57   Pulse: 76 76 78   Temp: 98.6 F (37 C) 98.8 F (37.1 C) 98.6 F (37 C)   TempSrc: Oral Oral Oral   Resp: 20 20 20    Height:      Weight:   68.8 kg (151 lb 10.8 oz)   SpO2: 98% 97% 97% 98%   Weight change: -0.5 kg (-1 lb 1.6 oz)  Intake/Output Summary (Last 24 hours) at 08/23/13 1201 Last data filed at 08/23/13 0854  Gross per 24 hour  Intake   1080 ml  Output    652 ml  Net    428 ml   Constitutional: No distress.  HENT:  Head: Normocephalic and atraumatic.  Cardiovascular: Normal rate, irregular rhythm, murmur present  Pulmonary/Chest: She is in not in respiiatory distress.  No wheezing, ronchi, or rales.  Abdomen: Soft. Bowel sounds are normal. She exhibits no distension. There is no tenderness.  Musculoskeletal: She exhibits right knee swelling and warmth with mild tenderness to palpation  Neurological: Alert & Oriented x 3   Skin: Skin is dry. She is not diaphoretic   Lab Results: Basic Metabolic Panel:  Recent Labs Lab 08/22/13 0703 08/23/13 0618  NA 137 138  K 4.1 3.5*  CL 99 97  CO2 27 29  GLUCOSE 129* 203*  BUN 11 15  CREATININE 0.55 0.53  CALCIUM 8.6 8.6  MG  --  2.0   Liver Function Tests:  Recent Labs Lab 08/18/13 1444  AST 13  ALT 5  ALKPHOS 74  BILITOT 0.7  PROT 6.5  ALBUMIN 3.4*    Recent Labs Lab 08/18/13 1444  LIPASE 25   No results found for this basename: AMMONIA,  in the last 168 hours CBC:  Recent Labs Lab 08/18/13 1444 08/19/13 0545 08/22/13 0703  WBC 2.9* 3.3* 4.6    NEUTROABS 1.0*  --   --   HGB 13.5 13.2 13.4  HCT 40.8 41.2 40.2  MCV 92.7 95.4 91.4  PLT 270 241 219   Cardiac Enzymes:  Recent Labs Lab 08/19/13 0211 08/19/13 0711 08/19/13 1218  TROPONINI <0.30 <0.30 <0.30   BNP: No results found for this basename: PROBNP,  in the last 168 hours D-Dimer: No results found for this basename: DDIMER,  in the last 168 hours CBG:  Recent Labs Lab 08/22/13 0624 08/22/13 1113 08/22/13 1652 08/22/13 2101 08/23/13 0646 08/23/13 1054  GLUCAP 125* 181* 130* 257* 223* 153*   Hemoglobin A1C: No results found for this basename: HGBA1C,  in the last 168 hours Fasting Lipid Panel: No results found for this basename: CHOL, HDL, LDLCALC, TRIG, CHOLHDL, LDLDIRECT,  in the last 168 hours Thyroid Function Tests:  Recent Labs Lab 08/19/13 0545  TSH 1.563   Coagulation:  Recent Labs Lab 08/18/13 1610  LABPROT 15.1  INR 1.22   Anemia Panel: No results found for this basename: VITAMINB12, FOLATE, FERRITIN, TIBC, IRON, RETICCTPCT,  in the last 168 hours Urine Drug Screen: Drugs of Abuse  No results found for this basename: labopia,  cocainscrnur,  labbenz,  amphetmu,  thcu,  labbarb    Alcohol Level: No results found for this basename: ETH,  in the last 168 hours Urinalysis:  Recent Labs Lab 08/18/13 1640  COLORURINE YELLOW  LABSPEC 1.017  PHURINE 7.0  GLUCOSEU NEGATIVE  HGBUR NEGATIVE  BILIRUBINUR NEGATIVE  KETONESUR NEGATIVE  PROTEINUR NEGATIVE  UROBILINOGEN 0.2  NITRITE NEGATIVE  LEUKOCYTESUR NEGATIVE    Micro Results: Recent Results (from the past 240 hour(s))  MRSA PCR SCREENING     Status: None   Collection Time    08/19/13  1:53 AM      Result Value Range Status   MRSA by PCR NEGATIVE  NEGATIVE Final   Comment:            The GeneXpert MRSA Assay (FDA     approved for NASAL specimens     only), is one component of a     comprehensive MRSA colonization     surveillance program. It is not     intended to  diagnose MRSA     infection nor to guide or     monitor treatment for     MRSA infections.   Studies/Results: No results found. Medications: I have reviewed the patient's current medications. Scheduled Meds: . brimonidine  1 drop Left Eye BID  . colchicine  0.6 mg Oral Daily  . dorzolamide  1 drop Left Eye BID  . feeding supplement (GLUCERNA SHAKE)  237 mL Oral Q24H  . furosemide  40 mg Oral BID  . insulin aspart  0-9 Units Subcutaneous TID WC  . lisinopril  20 mg Oral Daily  . metoprolol succinate  50 mg Oral Daily  . mometasone-formoterol  2 puff Inhalation BID  . potassium chloride  20 mEq Oral Daily  . rivaroxaban  15 mg Oral BID   Followed by  . [START ON 09/09/2013] rivaroxaban  20 mg Oral Daily  . sodium chloride  3 mL Intravenous Q12H   Continuous Infusions:  PRN Meds:.albuterol, ondansetron (ZOFRAN) IV, ondansetron Assessment/Plan: Principal Problem:   Pulmonary embolism Active Problems:   DIABETES MELLITUS, TYPE II   HYPERTENSION   CARDIOMYOPATHY, DILATED   Wound of right leg   Acute on chronic combined systolic and diastolic congestive heart failure   Atrial fibrillation   Malnutrition of moderate degree  Assessment: 80 year old woman with past medical history of afib on xarelto who presented with abdominal pain and diarrhea and found to have submassive pulmonary embolism.   Plan:  Submassive left lower lobe pulmonary embolism complicated by right heart strain - clinically improved. Etiology unknown, possibly due to noncompliance/ failure of xarelto vs stasis due to right leg trauma vs hypercoagulable state (malignancy). Pt initially received IV heparin.  -Supplemental oxygen therapy, BiPap if needed to keep SpO2 >92% -Continue xarelto 15 mg BID Day 5/ 21 days, then 20 mg daily per cardiology recommendations -PT/OT consult  --> home health PT, hospital bed, wheelchair cushion, wheelchair    Non-valvular atrial fibrillation with bradycardia  - currently in  afib, HR WNL Pt at home on metoprolol and digoxin for rate control and [facility] -Change metoprolol succinate 150 mg daily to 50 mg daily in setting of bradycardia    -Hold digoxin 0.125mg  in setting of bradycardia and on discharge   -Continue xarelto 15 mg BID Day 5/21, then 20 mg daily -Monitor on telemetry   Pseudogout -- improving s/p colchicine (NSAIDs contraindicated in setting of xarelto) -Colchicine 0.6mg  daily Day 3, continue for 3 additional days with  close clinic follow-up  Systolic Heart Failure due to dilated cardiomyopathy - 2D-echo on 04/04/13 with EF 20-25%. No evidence of fluid overload on exam.   -Appreciate cardiology recs -2D-echo on 08/19/13 with EF 40-45% with moderate LVH and no wall motion -Monitor daily weight (152 to 151 lb) & I & O's (-250 mL) -Per cardiology, continue  furosemide 40 mg BID -Continue to monitor volume status (euvolemic on exam) and renal function (WNL)  Hypertension - currently hypertensive  -Per cardiology, start furosemide 40 mg BID and continue on discharge -Per cardiology start lopressor 25 mg BID and continue on discharge  -Continue home lisinopril 20 mg daily and on discharge -Hydralazine PRN  Non-insulin dependent Type II DM  - Last A1c of 7.9 on 05/26/13. Pt on metformin at home.  -Hold metformin 1000mg  BID for a least 48 hrs after contrast, continue on discharge  -Insulin sliding scale with meals -Monitor glucose frequently  Asthma - Pt without recent exacerbation or use of corticosteroids. Pt on advair and albuterol at home.  -Albuterol PRN acute bronchospasm  -Dulera 2 puffs BID  Left Lobe Thyroid Mass - on CTA chest. TSH normal.   -Obtain Thyroid US1/3: L thyroid lobe markedly enlarged with large calcifications. Dominant lesion measuring up to 5cm. 1cm nodule in R thyroid lobe.  -Will need FNA outpatient in setting of euthyroid nodule, however currently on xarelto   Hepatic steatosis with scattered hypodense liver lesions -  on abdominal CT and lesions also noted on 2001 CT.  - Per curbside GI consult, lesions nonconcerning and based on pt's age will unlikely warrant further intervention - Consider Hepatic Protocol MRI outpatient if pt would like and can tolerate, however not on xarelto   Glaucoma  -Opthalmic drops, brimonidine 0.1% and dorzolamide 2% left eye BID  Diet: Renal Code: Full  DVT Ppx: Xarelto   Dispo: Disposition is deferred at this time, awaiting improvement of current medical problems.  Anticipated discharge in approximately 1 day.   The patient does have a current PCP Evelena Peat, DO) and does need an [facility] hospital follow-up appointment after discharge.  The patient does have transportation limitations that hinder transportation to clinic appointments.  .Services Needed at time of discharge: Y = Yes, Blank = No PT:   OT:   RN:   Equipment:   Other:     LOS: 5 days   Otis Brace, MD 08/23/2013, 12:01 PM

## 2013-08-24 ENCOUNTER — Other Ambulatory Visit: Payer: Self-pay | Admitting: *Deleted

## 2013-08-24 NOTE — Discharge Summary (Signed)
Name: [name] MRN: [id number] DOB: 1926/06/21 80 y.o. PCP: Wendy Peat, DO  Date of Admission: 08/18/2013  1:16 PM Date of Discharge: 08/24/2013 Attending Physician:  [name]. Wendy Peaches, MD  Discharge Diagnosis:  Principal Problem:   Pulmonary embolism Active Problems:   DIABETES MELLITUS, TYPE II   HYPERTENSION   CARDIOMYOPATHY, DILATED   Wound of right leg   Acute on chronic combined systolic and diastolic congestive heart failure   Atrial fibrillation   Malnutrition of moderate degree  Discharge Medications:   Medication List    STOP taking these medications       digoxin 0.125 MG tablet  Commonly known as:  LANOXIN     metroNIDAZOLE 500 MG tablet  Commonly known as:  FLAGYL      TAKE these medications       albuterol 108 (90 BASE) MCG/ACT inhaler  Commonly known as:  PROVENTIL HFA;VENTOLIN HFA  Inhale 1-2 puffs into the lungs every 6 (six) hours as needed for shortness of breath.     brimonidine 0.1 % Soln  Commonly known as:  ALPHAGAN P  Place 1 drop into the left eye every 12 (twelve) hours.     colchicine 0.6 MG tablet  Take 1 tablet (0.6 mg total) by mouth daily.     Fluticasone-Salmeterol 250-50 MCG/DOSE Aepb  Commonly known as:  ADVAIR  Inhale 1 puff into the lungs 2 (two) times daily.     furosemide 80 MG tablet  Commonly known as:  LASIX  Take 0.5 tablets (40 mg total) by mouth 2 (two) times daily.     lisinopril 20 MG tablet  Commonly known as:  PRINIVIL,ZESTRIL  Take 1 tablet (20 mg total) by mouth daily.     metFORMIN 1000 MG tablet  Commonly known as:  GLUCOPHAGE  Take 1,000 mg by mouth 2 (two) times daily with a meal.     metoprolol succinate 50 MG 24 hr tablet  Commonly known as:  TOPROL-XL  Take 1 tablet (50 mg total) by mouth daily.     potassium chloride 10 MEQ CR capsule  Commonly known as:  MICRO-K  Take 2 capsules (20 mEq total) by mouth daily.     Rivaroxaban 15 MG Tabs tablet  Commonly known as:  XARELTO  Take 1  tablet (15 mg total) by mouth 2 (two) times daily.     Rivaroxaban 20 MG Tabs tablet  Commonly known as:  XARELTO  Take 1 tablet (20 mg total) by mouth daily.  Start taking on:  09/09/2013     TRUSOPT 2 % ophthalmic solution  Generic drug:  dorzolamide  Place 1 drop into the left eye 2 (two) times daily.        Disposition and follow-up:   [name] was discharged from [facility] in Good condition.  At the hospital follow up visit please address:  1.   Resolution of bradycardia after change in medications - f/u with cards on 1/21       Compliance with Xarelto       Resolution of hypokalemia       Assess renal function after restarting metformin (had contrast)       Resolution of presumed pseudogout flare, if resolved, can d/c colchicine            Resolution of right tibial wound          If physical therapy came to her house        Needs  FNA of thyroid euthyroid nodule        Hepatic steatosis with liver lesions - consider GI referral for hepatic protocol MRI     2.  Labs / imaging needed at time of follow-up: BMP (K, Cr)  3.  Pending labs/ test needing follow-up: none   Follow-up Appointments:     Follow-up Information   Follow up with [facility]. (RN/PT)    Contact information:   [address] [place] [address] [phone]       Follow up with Wendy Masson, MD On 09/07/2013. (3:00PM )    Specialty:  Cardiology   Contact information:   [address] [place] [address] [phone]       Follow up with Wendy Peat, DO On 08/25/2013. (1:45PM)    Specialty:  Internal Medicine   Contact information:   [address] [place] [address] [phone]       Discharge Instructions: Discharge Orders   Future Appointments Provider Department Dept Phone   08/25/2013 1:45 PM Wendy Peat, DO [facility] [phone]   09/07/2013 3:00 PM Wendy Masson, MD  [facility] [phone]   Future Orders Complete By Expires   Diet - low sodium heart healthy  As directed    Increase activity slowly  As directed       Consultations: Treatment Team:  Wendy Nations, MD  Procedures Performed:  Dg Chest 2 View  08/18/2013   CLINICAL DATA:  Cough and wheezing.  EXAM: CHEST  2 VIEW  COMPARISON:  07/02/2013.  FINDINGS: The heart is enlarged but stable. There is tortuosity, ectasia and calcification of the thoracic aorta. Low lung volumes with vascular crowding and basilar atelectasis. No obvious infiltrates or effusions. No pneumothorax. The bony thorax is grossly intact.  IMPRESSION: Stable cardiac enlargement.  No obvious infiltrates or effusions.   Electronically Signed   By: [name] M.D.   On: 08/18/2013 15:37   Ct Head Wo Contrast  08/18/2013   CLINICAL DATA:  Weakness, decreased appetite  EXAM: CT HEAD WITHOUT CONTRAST  TECHNIQUE: Contiguous axial images were obtained from the base of the skull through the vertex without intravenous contrast.  COMPARISON:  10/25/2010  FINDINGS: Moderate age-related atrophy. Moderate to severe low attenuation in the deep white matter. No evidence of acute infarct. No hemorrhage or extra-axial fluid. No hydrocephalus. Mild inflammatory change in the sinuses, much less prominent when compared to the prior study.  IMPRESSION: Chronic involutional change.  No acute findings.   Electronically Signed   By: [name] M.D.   On: 08/18/2013 16:10   Ct Angio Chest Pe W/cm &/or Wo Cm  08/18/2013   CLINICAL DATA:  Diarrhea and weakness. Decreased appetite. Abnormal appearance of the left lower lobe on recent CT of the abdomen and pelvis. Evaluate for potential pulmonary embolism.  EXAM: CT ANGIOGRAPHY CHEST WITH CONTRAST  TECHNIQUE: Multidetector CT imaging of the chest was performed using the standard protocol during bolus administration of intravenous contrast. Multiplanar CT image reconstructions  including MIPs were obtained to evaluate the vascular anatomy.  CONTRAST:  50mL OMNIPAQUE IOHEXOL 350 MG/ML SOLN  COMPARISON:  CT of the abdomen and pelvis 08/18/2013.  FINDINGS: Mediastinum: Study is severely limited by a large amount of patient respiratory motion. Despite these limitations, there are are filling defects in the distal left lower lobe pulmonary artery, extending into all basal segmental and subsegmental sized branches, which appear  completely occlusive, compatible with pulmonary embolism. No additional filling defects are otherwise noted. However, there is massive dilatation of the pulmonic trunk (5 cm in diameter) and the main pulmonary arteries, indicative of pulmonary arterial hypertension. Severe cardiomegaly is noted. There is no significant pericardial fluid, thickening or pericardial calcification. There is atherosclerosis of the thoracic aorta, the great vessels of the mediastinum and the coronary arteries, including calcified atherosclerotic plaque in the left main, left anterior descending and right coronary arteries. No pathologically enlarged mediastinal or hilar lymph nodes. Marked enlargement and heterogeneous appearance of the left lobe of the thyroid gland which measures up to 5.0 x 4.2 cm and has multiple internal coarse calcifications.  Lungs/Pleura: Despite the extensive occlusive embolus in the left lower lobe, there is no consolidative airspace disease to suggest hemorrhage from pulmonary infarction at this time. Mild diffuse bronchial wall thickening. No pleural effusions. Multiple areas of linear scarring are noted, most pronounced in the medial segment of the right middle lobe and inferior segment of the lingula.  Upper Abdomen: See separate dictation for CT of the abdomen and pelvis 08/18/2013.  Musculoskeletal: There are no aggressive appearing lytic or blastic lesions noted in the visualized portions of the skeleton.  Review of the MIP images confirms the above findings.   IMPRESSION: 1. Study is positive for occlusive embolus in the distal left lower lobe pulmonary artery extending into all basal segmental and subsegmental sized branches. No definite hemorrhage to suggest pulmonary infarction at this time. 2. Massively dilated pulmonic trunk and pulmonary arteries, indicative of underlying pulmonary arterial hypertension. 3. Severe cardiomegaly. 4. Atherosclerosis, including left main and 2 vessel coronary artery disease. 5. Markedly enlarged and heterogeneous appearing partially calcified masslike appearance of the left lobe of the thyroid gland. While this may simply represent a goiter, clinical correlation is recommended, and consideration for followup nonemergent thyroid ultrasound may be warranted if there is clinical concern for thyroid disease. 6. Additional incidental findings, as above.   Electronically Signed   By: Trudie Reed M.D.   On: 08/18/2013 17:29   US Soft Tissue Head/neck  08/20/2013   CLINICAL DATA:  Abnormal appearance of the thyroid on recent CT.  EXAM: THYROID ULTRASOUND  TECHNIQUE: Ultrasound examination of the thyroid gland and adjacent soft tissues was performed.  COMPARISON:  Chest CT 08/18/2013  FINDINGS: Right thyroid lobe  Measurements: 4.5 x 1.8 x 1.5 cm. There is a mildly heterogeneous nodule in the medial right thyroid lobe that measures 1.0 x 0.9 x 0.6 cm. No definite calcifications within this nodule.  Left thyroid lobe  Measurements: 8.6 x 4.7 x 4.8 cm. The left thyroid lobe is markedly enlarged and heterogeneous. There is a dominant heterogeneous nodule in the mid left thyroid lobe that roughly measures 3.4 x 5.0 x 4.6 cm. This dominant nodule contains dystrophic calcifications with posterior acoustic shadowing. There is also prominent thyroid tissue that extends inferiorly towards the chest but difficult to evaluate.  Isthmus  Thickness: 0.3 cm.  No nodules visualized.  Lymphadenopathy  None visualized.  IMPRESSION: Left thyroid lobe is  markedly enlarged with a dominant heterogeneous lesion containing large calcifications. The dominant lesion measures up to 5 cm.  1 cm nodule in the right thyroid lobe.   Electronically Signed   By: Richarda Overlie M.D.   On: 08/20/2013 17:47   Ct Abdomen Pelvis W Contrast  08/18/2013   CLINICAL DATA:  Diarrhea and weakness. Reduced appetite. Hypertension. Diabetes.  EXAM: CT ABDOMEN AND PELVIS WITH CONTRAST  TECHNIQUE: Multidetector CT imaging of the abdomen and pelvis was performed using the standard protocol following bolus administration of intravenous contrast.  CONTRAST:  100mL OMNIPAQUE IOHEXOL 300 MG/ML  SOLN  COMPARISON:  None.  FINDINGS: Prominent 4 chamber cardiomegaly. Abnormal could low-density linear branching structure in the left lower lobe, 0 representing either bronchiectasis with mucoid impaction in the visualize left lower lobe or pulmonary embolus. There is asymmetric reduced vascularity in the left lower lobe which might favor embolus. Right ventricular to left ventricular diameter ratio 1.0.  Diffuse hepatic steatosis with scattered hypodense lesions in the liver measuring up to 1.3 cm in diameter. Some of these may represent cysts but summer nonspecific  Spleen, pancreas, and adrenal glands normal. Hypoenhancement of the kidneys noted on initial arterial phase images, potentially from the renal artery atherosclerosis. The kidneys do symmetrically enhance on the delayed phase images.  Mild left greater than right hydroureter extending to the iliac vessel cross over.  Prominent stool throughout the colon favors constipation. Mild sigmoid colon redundancy. Aortoiliac atherosclerosis.  Right hemipelvis partially obscured by right hip implant. Oral contrast does not made its way to the distal small bowel up the time of imaging, in the appendix is not confidently seen. There is chondrocalcinosis at the pubic symphysis.  Moderate dextroconvex lumbar scoliosis with spondylosis and degenerative disc  disease at all lumbar levels. There is mild grade 1 anterolisthesis at L5-S1 without pars defects.  There is chondrocalcinosis of the acetabular labrum.  IMPRESSION: 1. Branching soft tissue density in the left lower lobe could represent pulmonary embolus or a plugged bronchiectatic left lower lobe bronchus. Consider CT angiography of the chest. 2. Prominent cardiomegaly, with the right ventricle larger than the left. If there is pulmonary embolus, this could indicate right heart strain. 3. Diffuse hepatic steatosis. 4. Scattered hypodense liver lesions. These were noted on the report from the prior exam from 2001, but those images are not available and I am not able to directly compare to see if any of these are new or changed. Accordingly, malignancy is not excluded. These could be further worked up with hepatic protocol MRI, if clinically warranted. 5. Mild hypo enhancement of the kidneys on arterial phase images, possibly due to renal artery atherosclerosis. 6. Mild left greater than right hydroureter proximally, extending to the iliac vessel cross over. No visible renal calculus. 7. Nonvisualization of the appendix. 8. 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 9. Moderate dextroconvex lumbar scoliosis with spondylosis and degenerative disc disease at all lumbar levels.   Electronically Signed   By: Herbie BaltimoreWalt  Liebkemann M.D.   On: 08/18/2013 16:21    2D Echo:   Cardiac Cath: none  Admission HPI: Original Author Carlynn PurlErik Hoffman, DO  Ms. Ferne CoeHazel Lowery is a 80 y.o. female with a PMHx of HTN, DMT2, Asthma, Churg-Strauss syndrome, A fib on Xarelto, CHF with EF of 20-25%, MR and TR. [name] accompanied by her daughter with complaints of generalized weakness, abdominal pain, and diarrhea. She had presented for similar complaints on 08/11/13 and was discharged with a prescription for Flagyl although no testing for C diff or other GI pathogen appears to have been  completed at that time. [facility] of Abd/Pelvis did not reveal the cause for her abdominal pain but did show the possibility of a LLL PE. A subsequent CTA of the chest confirmed the presence of a submassive LLL PE with evidence of right heart strain. She was then  transferred to [facility] for admission. On her arrival [name] was drowsy but did report that she had no pain, a little bit of SOB, mostly she reported feeling weak with no appetite. She admitted some abdominal pain and diarrhea, but denies N/V. She denies any chest pain or palpitations.    Hospital Course by problem list: Principal Problem:   Pulmonary embolism Active Problems:   DIABETES MELLITUS, TYPE II   HYPERTENSION   CARDIOMYOPATHY, DILATED   Wound of right leg   Acute on chronic combined systolic and diastolic congestive heart failure   Atrial fibrillation   Malnutrition of moderate degree   Submassive left lower lobe pulmonary embolism complicated by right heart strain - Pt presented with generalized weakness, abdominal pain, and diarrhea and found to have on CTA chest imaging an embolus in the distal left lower lobe pulmonary artery extending into all basal segmental and subsegmental sized branches with no definite hemorrhage to suggest pulmonary infarction. Pt also with prominent cardiomegaly, with the right ventricle larger than the left suggesting right heart strain. Etiology of PE unknown, possibly due to decreased PO intake on Xarelto as she did report compliance vs  failure of xarelto vs stasis due to right leg trauma vs hypercoagulable state (malignancy). CT head w/o contrast revealed no acute finding.  Pt initially required oxygen supplementation on 1st day of hospitalization but later with normal oxygen saturation on room air.  ABG with hypercapnia (56.5) and elevated bicarbonate (30.7). Pt initially received IV heparin, then received xarelto 15 mg BID for 5 days and instructed to continue for 16 more days for total  21 day course after which time to take 20 mg daily per cardiology recommendations.  At time of discharge pt without complaints of dyspnea and able to ambulate with normal oxygen saturation on room air.   Non-valvular atrial fibrillation with bradycardia -Pt with newly diagnosed atrial fibrillation 04/2013 on metoprolol  and digoxin for rate control and xarelto for [facility] therapy. Pt  with sustained atrial fibrillation during hospitalization with asymptomatic bradycardia (30s) and heart rate ranged between 35-129. Cardiac enzymes were normal and 12-lead EKG without ischemic changes.  Pt at home on metoprolol succinate and digoxin for rate control and xarelto for [facility]. Digoxin levels were within normal limits.  Per cardiology recommendations, pt was changed from metoprolol succinate 150 mg daily to metoprolol tartrate 25 mg BID in setting of bradycardia and to be discharged on metoprolol succinate 50 mg daily. Pt's digoxin 0.125mg  was held  in setting of bradycardia and on discharge. Pt to continue xarelto 15 mg BID for 16 additional days then 20 mg daily thereafter and to consume with food. Pt to have cardiology follow-up in 1-2 weeks with Dr. Delton See.   Pseudogout --Pt with history of of flare noted in ED visit and per family in June 2014, however unclear if ever treated with colchicine. Pt with chondrocalcinosis in the pelvis compatible with CPPD arthropathy. Pt with development of right knee pain and swelling during hospitalization with inability to obtain  joint aspiration to assess for crystals.  Pt received PT therapy during hospitalization and able to ambulate with some difficulty. Pt received colchicine (1.2 mg x1 day then 0.6 mg) for 3 days and instructed to continue for 3 additional days with close clinic follow-up to assess if additional dose(s) warranted. Pt to have home health PT and OT.  Abdominal Pain - Pt with resolution of abdominal pain during hospitalization.Pt with no acute process on CT abdomen  imaging. Further  work-up (lipase, UA, c diff.TSH) was unrevealing.   Systolic Heart Failure due to dilated cardiomyopathy - 2D-echo on 03/04/13 with EF 20-25%, MR and TR. Pt euvolemic on exam with 20 lb weight loss from dry weight since discharge in October 2014 and 2lb weight loss during hospitalization (153 to 151 lb).  Chest x ray on admission with no pulmonary vascular congestion.  2D-echo on 08/19/13 with EF 40-45% with moderate LVH, and secundum atrial septal defect. Pt was not continued on furosemide (due to hypovolemia) or home metoprolol succinate (due to bradycardia) during hospitalization.  Per cardiology, metoprolol tartrate 25 mg BID and  furosemide 40 mg BID began during hospitalization. Per cardiology recommendations, pt to continue metoprolol succinate 50 mg daily and furosemide 40 mg BID on discharge.   Hypertension - Pt with blood pressure range of 111/47 to 196/88. Initially, home furosemide and metoprolol were held due to hypovolemia and bradycardia. Pt received hydralazine as needed and restarted on home lisinopril during hospitalization (reported she had not taken in last month). Per cardiology,  furosemide 40 mg BID, metoprolol sucinate 50 mg daily, and lisinopril 20 mg daily on time of discharge   Non-insulin dependent Type II DM - Last A1c of 7.9 on 05/26/13. Pt on metformin at home which was held during hospitalization. Pt received IV contrast for imaging. Pt to continue metformin on discharge and renal function will be assessed at hospital follow-up appointment. Pt was continued on insulin sliding scale with meals and glucose was monitored frequently with glucose range of 81-257 without hypoglycemic events.   Hypokalemia - Pt with mild low potassium level 3.5 on day of discharge. Pt received oral potassium chloride replacement and to have recheck BMET in 2 days to ensure normalization.   Leukopenia - Pt presented with WBC of 2.9K with normalization during hospitalization. Pt not  immunocompromised.   Right Tibial wound - Pt with healing right tibial wound present for few months. Pt received frequent dressing changes during hospitalization with no signs of infection of  wound.    Asthma - Pt without recent exacerbation during hospitalization or use of corticosteroids. Pt on advair and albuterol at home that were continued [facility] instead of advair) during hospitalization.   Severe Malnutrition - Pt with normal BMI 24.1 with 13% weight loss in 3 months and PO intake <75% of estimated needs meeting criteria for severe malnutrition. Pt was continued on renal diet with weight loss of 2 lbs (153 to 151).  Left Lobe Thyroid Mass - Pt with enlarged and heterogeneous appearing partially calcified masslike appearance of the left lobe of the thyroid gland on CTA chest. TSH normal. Thyroid US on 1/3 with L thyroid lobe markedly enlarged with large calcifications. Dominant lesion measuring up to 5cm. 1cm nodule in R thyroid lobe. Will need FNA outpatient in setting of euthyroid nodule (pt currently on [facility] with xarelto).    Hepatic steatosis with scattered hypodense liver lesions - Pt with evidence of scattered hypodense liver lesions on abdominal CT with lesions also noted on 2001 CT iamging. Per radiology report to consider hepatic protocol MRI  to evaluate etiology.    Glaucoma -  Pt continued on home opthalmic drops, brimonidine and dorzolamide left eye with no reports of vision change.    Discharge Vitals:   BP 128/65  Pulse 74  Temp(Src) 98.7 F (37.1 C) (Oral)  Resp 24  Ht 5\' 5"  (1.651 m)  Wt 68.8 kg (151 lb 10.8 oz)  BMI 25.24 kg/m2  SpO2 97%  Discharge Labs:  Results for orders placed during the hospital encounter of 08/18/13 (from the past 24 hour(s))  BASIC METABOLIC PANEL     Status: Abnormal   Collection Time    08/23/13  6:18 AM      Result Value Range   Sodium 138  137 - 147 mEq/L   Potassium 3.5 (*) 3.7 - 5.3 mEq/L   Chloride 97  96 - 112 mEq/L   CO2 29   19 - 32 mEq/L   Glucose, Bld 203 (*) 70 - 99 mg/dL   BUN 15  6 - 23 mg/dL   Creatinine, Ser 1.61  0.50 - 1.10 mg/dL   Calcium 8.6  8.4 - 09.6 mg/dL   GFR calc non Af Amer 84 (*) >90 mL/min   GFR calc Af Amer >90  >90 mL/min  MAGNESIUM     Status: None   Collection Time    08/23/13  6:18 AM      Result Value Range   Magnesium 2.0  1.5 - 2.5 mg/dL  GLUCOSE, CAPILLARY     Status: Abnormal   Collection Time    08/23/13  6:46 AM      Result Value Range   Glucose-Capillary 223 (*) 70 - 99 mg/dL  GLUCOSE, CAPILLARY     Status: Abnormal   Collection Time    08/23/13 10:54 AM      Result Value Range   Glucose-Capillary 153 (*) 70 - 99 mg/dL   Comment 1 Notify RN    GLUCOSE, CAPILLARY     Status: Abnormal   Collection Time    08/23/13  3:51 PM      Result Value Range   Glucose-Capillary 162 (*) 70 - 99 mg/dL   Comment 1 Notify RN      Signed: Otis Brace, MD 08/24/2013, 1:13 AM   Time Spent on Discharge: 60  minutes Services Ordered on Discharge: Home health PT/OT/RN Equipment Ordered on Discharge: hospital bed, wheelchair with cushion

## 2013-08-25 ENCOUNTER — Encounter: Payer: Self-pay | Admitting: Internal Medicine

## 2013-08-25 ENCOUNTER — Ambulatory Visit (INDEPENDENT_AMBULATORY_CARE_PROVIDER_SITE_OTHER): Payer: PRIVATE HEALTH INSURANCE | Admitting: Internal Medicine

## 2013-08-25 VITALS — BP 136/94 | HR 90 | Temp 97.0°F

## 2013-08-25 DIAGNOSIS — E119 Type 2 diabetes mellitus without complications: Secondary | ICD-10-CM

## 2013-08-25 DIAGNOSIS — I2699 Other pulmonary embolism without acute cor pulmonale: Secondary | ICD-10-CM

## 2013-08-25 DIAGNOSIS — I4891 Unspecified atrial fibrillation: Secondary | ICD-10-CM

## 2013-08-25 DIAGNOSIS — S81801D Unspecified open wound, right lower leg, subsequent encounter: Secondary | ICD-10-CM

## 2013-08-25 DIAGNOSIS — E079 Disorder of thyroid, unspecified: Secondary | ICD-10-CM

## 2013-08-25 DIAGNOSIS — M25461 Effusion, right knee: Secondary | ICD-10-CM

## 2013-08-25 DIAGNOSIS — M25469 Effusion, unspecified knee: Secondary | ICD-10-CM

## 2013-08-25 DIAGNOSIS — Z5189 Encounter for other specified aftercare: Secondary | ICD-10-CM

## 2013-08-25 LAB — BASIC METABOLIC PANEL WITH GFR
BUN: 15 mg/dL (ref 6–23)
CALCIUM: 9 mg/dL (ref 8.4–10.5)
CO2: 30 mEq/L (ref 19–32)
Chloride: 101 mEq/L (ref 96–112)
Creat: 0.51 mg/dL (ref 0.50–1.10)
GFR, EST NON AFRICAN AMERICAN: 88 mL/min
Glucose, Bld: 125 mg/dL — ABNORMAL HIGH (ref 70–99)
POTASSIUM: 4.7 meq/L (ref 3.5–5.3)
Sodium: 136 mEq/L (ref 135–145)

## 2013-08-25 LAB — GLUCOSE, CAPILLARY: GLUCOSE-CAPILLARY: 111 mg/dL — AB (ref 70–99)

## 2013-08-25 LAB — POCT GLYCOSYLATED HEMOGLOBIN (HGB A1C): HEMOGLOBIN A1C: 6.6

## 2013-08-25 MED ORDER — COLCHICINE 0.6 MG PO TABS: 0.6000 mg | ORAL_TABLET | Freq: Every day | ORAL | Status: DC

## 2013-08-25 NOTE — Patient Instructions (Signed)
1. Make sure you take Xarelto 15mg  twice per day through January 22.  Start taking 20mg  daily on January 23.     2. Please take all medications as prescribed.    3. If you have worsening of your symptoms or new symptoms arise, please call the clinic ([phone]), or go to the ER immediately if symptoms are severe.   4. Return to clinic in 3 months for follow-up.

## 2013-08-28 DIAGNOSIS — M25461 Effusion, right knee: Secondary | ICD-10-CM | POA: Insufficient documentation

## 2013-08-28 DIAGNOSIS — E079 Disorder of thyroid, unspecified: Secondary | ICD-10-CM | POA: Insufficient documentation

## 2013-08-28 NOTE — Assessment & Plan Note (Signed)
Improving.  Does not appear infected.  [facility]  Nurse now comes to her home 3 times per week and dresses the wound.

## 2013-08-28 NOTE — Assessment & Plan Note (Addendum)
Right knee is swollen, minimally TTP, no increased warmth or redness.  She was started on colchicine in hospital for pseudogout, however I do not see that xray or arthrocentesis was done to diagnose this.  She says she has never had joint swelling before.  She feels the colchicine is helping so will continue for 1 week.

## 2013-08-28 NOTE — Assessment & Plan Note (Signed)
She had Afib with bradycardia in the hospital so digoxin was held and discontinued at discharge.  Metoprolol was decreased from 150mg  daily to 50mg  daily.  Xarelto to be continued at 15mg  BID x 21 days, then 20mg  daily.  She has cardiology follow-up scheduled in 2 weeks.

## 2013-08-28 NOTE — Progress Notes (Signed)
   Subjective:    Patient ID: [name], female    DOB: 1926/05/17, 80 y.o.   MRN: [id number]  HPI Comments: [name] is a 80 year old woman with a PMH of atrial fibrillation (on Xarelto), HTN, dilated cardiomyopathy, systolic and diastolic HF (EF 97-67%), DM type 2, asthma and glaucoma.  She presents for hospital follow-up after 01/01-01/07/15 admission for PE.  She presented to the ED with weakness, abdominal pain and diarrhea.  CT abdomen/pelvis revealed possible LLL PE which was confirmed by CTA chest.  She was initially treated with with IV heparin and then Xarelto was resumed.  She was discharged on Xarelto 15mg  BID for a total of 21 days, after which time she will take 20mg  daily.    [name] is feeling better today.  She denies CP, SOB, dizziness, headaches, diarrhea, easy bruising or bleeding.  She had complaint of right knee pain and swelling so she was started on colchicine in the hospital.  She feels it is helping her pain.  Her right lower extremity wound is bandaged and both she and her granddaughter say it is improving.  A nurse comes to her home a few times per week and dresses it.  She reports compliance with all medications including twice daily Xarelto 15mg .  She has begun using her walker in her home again and PT came to the home today to work with her.    Regarding her DM:  Her HgbA1c is 6.6 today.  Metformin was resumed at hospital discharge.     Review of Systems  Constitutional: Negative for appetite change.  Respiratory: Negative for shortness of breath.   Cardiovascular: Negative for chest pain.  Gastrointestinal: Negative for diarrhea.  Musculoskeletal: Positive for joint swelling.  Neurological: Negative for dizziness and headaches.  Hematological: Does not bruise/bleed easily.       Objective:   Physical Exam  Vitals reviewed. Constitutional: She is oriented to person, place, and time. She appears well-developed. No distress.  Cardiovascular: Normal  rate, regular rhythm and normal heart sounds.   Pulmonary/Chest: Effort normal and breath sounds normal. No respiratory distress. She has no wheezes. She has no rales.  Abdominal: Soft. Bowel sounds are normal. She exhibits no distension. There is no tenderness.  Musculoskeletal: She exhibits no edema.  + right knee swelling; minimally TTP; no erythema or increased warmth  Neurological: She is alert and oriented to person, place, and time.  Skin: Skin is warm. She is not diaphoretic.  Psychiatric: She has a normal mood and affect. Her behavior is normal.          Assessment & Plan:  Please see problem based assessment and plan.

## 2013-08-28 NOTE — Assessment & Plan Note (Addendum)
Enlarged, heterogenous, partially calcified mass on left lobe of thyroid evident on CT.  She has a history of Grave's disease but TSH in hospital is normal.    Plan: 1) Review CT findings with radiologist and may consider referral to endocrine for [place] and/or FNA.             2) Return to clinic in 1 month for follow-up.

## 2013-08-28 NOTE — Assessment & Plan Note (Signed)
She denies CP or SOB and reports compliance with Xarelto 15mg  BID.  Plan to continue Xarelto 15mg  BID for 15 more days (21 days total) then start 20mg  daily.  Her granddaughter is present and understands the dosing.  It has also been printed in the AVS.

## 2013-08-28 NOTE — Assessment & Plan Note (Addendum)
Lab Results  Component Value Date   HGBA1C 6.6 08/25/2013   HGBA1C 7.9 05/26/2013   HGBA1C 7.8 03/16/2013     Assessment: Diabetes control:  well controlled Progress toward A1C goal:   at goal  Plan: Medications:  Continue metformin 1000mg  BID Other plans: Will check BMP today to monitor creatinine since she received IV contrast in hospital.  Repeat HgbA1c in 3 months

## 2013-08-30 NOTE — Addendum Note (Signed)
Addended by: Hassan Buckler on: 08/30/2013 10:38 AM   Modules accepted: Orders

## 2013-08-30 NOTE — Progress Notes (Signed)
Case discussed with Dr. Wilson at the time of the visit.  We reviewed the resident's history and exam and pertinent patient test results.  I agree with the assessment, diagnosis, and plan of care documented in the resident's note. 

## 2013-09-02 NOTE — Discharge Summary (Signed)
I saw [name] on day of discharge and assisted with the discharge planning.

## 2013-09-04 ENCOUNTER — Other Ambulatory Visit: Payer: Self-pay | Admitting: Internal Medicine

## 2013-09-06 ENCOUNTER — Other Ambulatory Visit: Payer: Self-pay | Admitting: Internal Medicine

## 2013-09-07 ENCOUNTER — Encounter: Payer: Self-pay | Admitting: Cardiology

## 2013-09-07 ENCOUNTER — Ambulatory Visit (INDEPENDENT_AMBULATORY_CARE_PROVIDER_SITE_OTHER): Payer: PRIVATE HEALTH INSURANCE | Admitting: Cardiology

## 2013-09-07 VITALS — BP 132/72 | HR 64 | Ht 65.0 in | Wt 139.0 lb

## 2013-09-07 DIAGNOSIS — I428 Other cardiomyopathies: Secondary | ICD-10-CM

## 2013-09-07 DIAGNOSIS — E079 Disorder of thyroid, unspecified: Secondary | ICD-10-CM

## 2013-09-07 NOTE — Patient Instructions (Addendum)
**Note De-Identified Ines Warf Obfuscation** Your physician has recommended you make the following change in your medication: stop taking Xarelto 15 mg twice daily and start taking Xarelto 20 mg daily  Your physician recommends that you return for lab work in: 1 to 2 weeks before his follow up here   Your physician recommends that you schedule a follow-up appointment in: 6 weeks

## 2013-09-07 NOTE — Progress Notes (Signed)
Patient ID: Ferne CoeHazel Mish, female   DOB: 25-Apr-1926, 80 y.o.   MRN: [id number]     Patient Name: Ferne CoeHazel Schembri Date of Encounter: 09/07/2013  Primary Care Provider:  Evelena PeatWilson, Alex, DO Primary Cardiologist:  Tobias AlexanderNELSON, Tommaso Cavitt, H   Problem List   Past Medical History  Diagnosis Date  . Hypertension   . Diabetes mellitus   . Asthma   . Osteoarthritis     left shoulder  . Pseudogout   . Hypokalemia     a. Previously felt due to diuretics, req supplementation.  . Churg-Strauss syndrome     a. Sural nerve biopsy 02/2000.  Darene Lamer. Grave's disease     a. s/p thyroidectomy.  . Glaucoma   . Hypercholesteremia   . Venous insufficiency   . Osteopenia     DEXA 2004  . Lipoma     left inner thigh  . Cataract     left eye  . VASCULITIS   . Chronic combined systolic and diastolic heart failure     a. 4/09817/2013 EF:20-25%  . Pleural effusion, left     a. Thoracentesis 2001 - per notes, no malignant cells, was transudative.  Marland Kitchen. NSVT (nonsustained ventricular tachycardia)     a. During CHF adm 2001.  . Multifocal atrial tachycardia     a. Documented as OP 01/2012.  Marland Kitchen. Ectopic atrial tachycardia     a. Documented on tele as IP 02/2012.  . Pulmonary HTN     a. Mod by echo 02/2012.  . Valvular heart disease     a. Echo 02/2012: mod MR/TR, mild AI.  Marland Kitchen. Pericardial effusion     a. Echo 02/2012: small-mod pericardial effusion.  . Atrial fibrillation, permanent     a. Dx 04/2013, on metoprolol, dig and xarelto   Past Surgical History  Procedure Laterality Date  . Abdominal hysterectomy    . Salpingoophorectomy    . Thyroidectomy    . Hemiarthroplasty hip Right 10/2010    cemented for hip fracture, femoral neck - by Eulas PostJoshua P Landau, MD  . Cataract extraction Right   . Hemiarthroplasty hip      Allergies  No Known Allergies  HPI  A 80 y.o. female with a history of HTN, DMT2, Asthma, Churg-Strauss syndrome, AFIB on metoprolol, digoxin and Xarelto, CHF with EF of 20-25%, MR and TR (ECHO 02/2012) who  [facility] with complaints of generalized weakness, abdominal pain, and diarrhea. She had presented for similar complaints on 08/11/13 and was discharged with a prescription for Flagyl although no testing for C diff or other GI pathogen appears to have been completed at that time. [facility] a CTA of Abd/Pelvis did not reveal the cause for her abdominal pain but did show the possibility of a LLL PE. A subsequent CTA of the chest confirmed the presence of a LLL PE with evidence of right heart strain. She was then [facility]. Patient has not had any diarrhea since admission and has been feeling better. She has had episodes of bradycardia yesterday and last night with heart rates 32-53 bpm. Patient does not complain of any lightheadedness, dizziness, pre-syncope, chest pain, SOB, orthopnea, PND, edema.   Her dose of Xarelto was increased to 15 mg po BID. She feels significantly better, no resting SOB. No CP, no LE edema.  Home Medications  Prior to Admission medications   Medication Sig Start Date End Date Taking? Authorizing Provider  albuterol (PROVENTIL HFA;VENTOLIN HFA) 108 (90 BASE) MCG/ACT  inhaler Inhale 1-2 puffs into the lungs every 6 (six) hours as needed for shortness of breath. 06/14/13  Yes Rocco Serene, MD  brimonidine (ALPHAGAN P) 0.1 % SOLN Place 1 drop into the left eye every 12 (twelve) hours. 05/27/12  Yes Lars Mage, MD  colchicine 0.6 MG tablet Take 1 tablet (0.6 mg total) by mouth daily. 08/25/13  Yes Evelena Peat, DO  dorzolamide (TRUSOPT) 2 % ophthalmic solution Place 1 drop into the left eye 2 (two) times daily.    Yes Historical Provider, MD  Fluticasone-Salmeterol (ADVAIR) 250-50 MCG/DOSE AEPB Inhale 1 puff into the lungs 2 (two) times daily. 06/15/13  Yes Rocco Serene, MD  furosemide (LASIX) 80 MG tablet Take 0.5 tablets (40 mg total) by mouth 2 (two) times daily. 08/23/13  Yes Marjan Rabbani, MD  lisinopril (PRINIVIL,ZESTRIL) 20 MG  tablet Take 1 tablet (20 mg total) by mouth daily. 08/23/13  Yes Otis Brace, MD  metFORMIN (GLUCOPHAGE) 1000 MG tablet Take 1,000 mg by mouth 2 (two) times daily with a meal.   Yes Historical Provider, MD  metoprolol succinate (TOPROL-XL) 50 MG 24 hr tablet Take 1 tablet (50 mg total) by mouth daily. 08/23/13  Yes Marjan Rabbani, MD  potassium chloride (MICRO-K) 10 MEQ CR capsule Take 2 capsules (20 mEq total) by mouth daily. 08/23/13  Yes Otis Brace, MD  Rivaroxaban (XARELTO) 15 MG TABS tablet Take 1 tablet (15 mg total) by mouth 2 (two) times daily. 08/24/13  Yes Otis Brace, MD  Rivaroxaban (XARELTO) 20 MG TABS tablet Take 1 tablet (20 mg total) by mouth daily. 09/09/13   Otis Brace, MD    Family History  Family History  Problem Relation Age of Onset  . Diabetes Mother   . Diabetes Father   . Heart disease Neg Hx     Social History  History   Social History  . Marital Status: Widowed    Spouse Name: N/A    Number of Children: N/A  . Years of Education: N/A   Occupational History  . Retired    Social History Main Topics  . Smoking status: Never Smoker   . Smokeless tobacco: Never Used  . Alcohol Use: No  . Drug Use: No  . Sexual Activity: Not on file   Other Topics Concern  . Not on file   Social History Narrative   Lives in house, with family just next door.    Never smoker, no alcohol.      Used to work at [employer].    Before that, worked in food prep.                     Review of Systems, as per HPI, otherwise negative General:  No chills, fever, night sweats or weight changes.  Cardiovascular:  No chest pain, dyspnea on exertion, edema, orthopnea, palpitations, paroxysmal nocturnal dyspnea. Dermatological: No rash, lesions/masses Respiratory: No cough, dyspnea Urologic: No hematuria, dysuria Abdominal:   No nausea, vomiting, diarrhea, bright red blood per rectum, melena, or hematemesis Neurologic:  No visual changes, wkns,  changes in mental status. All other systems reviewed and are otherwise negative except as noted above.  Physical Exam  Blood pressure 132/72, pulse 64, height 5\' 5"  (1.651 m), weight 139 lb (63.05 kg).  General: Pleasant, NAD Psych: Normal affect. Neuro: Alert and oriented X 3. Moves all extremities spontaneously. HEENT: Normal  Neck: Supple without bruits or JVD. Lungs:  Resp regular and unlabored, CTA. Heart: RRR  no s3, s4, or murmurs. Abdomen: Soft, non-tender, non-distended, BS + x 4.  Extremities: No clubbing, cyanosis or edema. DP/PT/Radials 2+ and equal bilaterally.  Labs:  No results found for this basename: CKTOTAL, CKMB, TROPONINI,  in the last 72 hours Lab Results  Component Value Date   WBC 4.6 08/22/2013   HGB 13.4 08/22/2013   HCT 40.2 08/22/2013   MCV 91.4 08/22/2013   PLT 219 08/22/2013   No results found for this basename: NA, K, CL, CO2, BUN, CREATININE, CALCIUM, LABALBU, PROT, BILITOT, ALKPHOS, ALT, AST, GLUCOSE,  in the last 168 hours Lab Results  Component Value Date   CHOL 132 03/16/2013   HDL 54 03/16/2013   LDLCALC 63 03/16/2013   TRIG 74 03/16/2013    Accessory Clinical Findings  Echocardiogram - 08/19/2013  - Left ventricle: Wall thickness was increased in a pattern of moderate LVH. Systolic function was mildly to moderately reduced. The estimated ejection fraction was in the range of 40% to 45%. Wall motion was normal; there were no regional wall motion abnormalities. Doppler parameters are consistent with arestrictive pattern, indicative of decreased left ventricular diastolic compliance and/or increased left atrial pressure (grade 3 diastolic dysfunction). Doppler parameters are consistent with elevated ventricular end-diastolic filling pressure. - Left atrium: The atrium was moderately dilated. - Right ventricle: The cavity size was mildly dilated. Systolic function was normal. - Right atrium: The atrium was moderately dilated. - Atrial septum: There  was a secundum atrial septal defect. Size and Qp/Qs cannot be assessed on current study. A TEE is recommended. - Tricuspid valve: Moderate-severe regurgitation. - Pulmonary arteries: Systolic pressure was severely increased. PA peak pressure: 60mm Hg (S).    Assessment & Plan  1. Acute LLL Pulmonary Embolus on 08/19/2013: This occurred while on xarelto 15mg  daily. That dose was apparently recommended by pharmacy when it was initiated for CVA prophylaxis in the setting of newly dx afib in October. Her creat clearance was normal at that time. In light of PE dx, she is currently on 15mg  bid with a plan to transition to 20mg  daily after 21 days - on 09/09/2013.   2. Acute on chronic combined syst/diast CHF: Volume stable. Euvolemic on exam.  Low-dose bb resumed at the hopsital and well tolerated.  No significant pauses or prolonged bradycardia.  Lasix 40mg  bid also resumed, we will continue.  Cont ACEI.  BMET in 6 weeks.  3. Afib/Bradycardia: Bb/digoxin held in setting of HR's in 30's on admission. Low dose bb resumed, today's HR 64 .  Anticoagulation with Xarelto (PE dosing at present).   4. HTN: bp stable.  5. Pseudogout: Significant r knee swelling and pain. Treatment per IM.   6. DM II: Per IM. Metformin on hold in setting of recent contrast exposure.   7. L Thyroid lesion: Will require outpt eval/FNA. Would not be able to come off of xarelto for potential procedures at this time in setting of #1. We will check TSH.  Follow up in 6 weeks with BMP and TSH.   Tobias Alexander, Rexene Edison, MD, [facility] 09/07/2013, 3:09 PM

## 2013-09-08 LAB — HM DIABETES EYE EXAM

## 2013-09-09 ENCOUNTER — Other Ambulatory Visit: Payer: Self-pay | Admitting: Internal Medicine

## 2013-09-12 ENCOUNTER — Other Ambulatory Visit (HOSPITAL_COMMUNITY): Payer: Self-pay | Admitting: Internal Medicine

## 2013-09-14 ENCOUNTER — Other Ambulatory Visit: Payer: Self-pay | Admitting: Internal Medicine

## 2013-09-14 ENCOUNTER — Other Ambulatory Visit (HOSPITAL_COMMUNITY): Payer: Self-pay | Admitting: Internal Medicine

## 2013-09-16 ENCOUNTER — Other Ambulatory Visit: Payer: Self-pay | Admitting: Internal Medicine

## 2013-09-20 ENCOUNTER — Other Ambulatory Visit: Payer: Self-pay | Admitting: Internal Medicine

## 2013-10-05 ENCOUNTER — Other Ambulatory Visit: Payer: PRIVATE HEALTH INSURANCE

## 2013-10-05 ENCOUNTER — Encounter: Payer: PRIVATE HEALTH INSURANCE | Admitting: Internal Medicine

## 2013-10-10 ENCOUNTER — Telehealth: Payer: Self-pay | Admitting: *Deleted

## 2013-10-10 NOTE — Telephone Encounter (Signed)
[facility] on [address] is requesting refill on Pravastatin 40mg  #30 - not on current med list. Last refill 07/13/13. Stanton Kidney Clemmie Buelna RN 10/10/13 10:15AM

## 2013-10-17 ENCOUNTER — Other Ambulatory Visit: Payer: Self-pay | Admitting: Internal Medicine

## 2013-10-18 ENCOUNTER — Other Ambulatory Visit: Payer: Self-pay | Admitting: Internal Medicine

## 2013-10-19 ENCOUNTER — Ambulatory Visit: Payer: PRIVATE HEALTH INSURANCE | Admitting: Cardiology

## 2013-10-28 ENCOUNTER — Encounter: Payer: Self-pay | Admitting: Cardiology

## 2013-11-20 ENCOUNTER — Other Ambulatory Visit: Payer: Self-pay | Admitting: Internal Medicine

## 2013-11-21 ENCOUNTER — Telehealth: Payer: Self-pay | Admitting: Internal Medicine

## 2013-11-21 ENCOUNTER — Other Ambulatory Visit: Payer: Self-pay | Admitting: Internal Medicine

## 2013-11-21 NOTE — Telephone Encounter (Signed)
[name] (Ms. Dowie daughter) returned my call this evening.  She says she has been giving her mother two K capsules (20 mEq total) once daily.  I asked that she bring her mother into clinic this week so I can check labs, specifically K.  She says she will ask her brother to bring [name] in.

## 2013-11-21 NOTE — Telephone Encounter (Signed)
Received request for K refill from patient's pharmacy.  Reviewed chart and there appeared to be duplicate rx.  I called to pharmacy and indeed there are two K rx and the patient has recently picked up both (early March).  I spoke with pharmacist and made sure there are no more refills available for the patient.  I attempted to call patient to find out how much K she has been taking and have her come in for lab draw to check K.  I attempted to call patient on home phone (disconnected) and on each one of the phone numbers for three of her children that are listed as contacts.  I was unable to reach anyone.  I have sent a message to clinic front desk to schedule patient to come in for lab check tomorrow.  I will attempt to call patient and her contacts again tonight.

## 2013-11-21 NOTE — Telephone Encounter (Signed)
I made multiple attempts to reach Ms. Bujold and her family members by telephone tonight in order to ask her how much K she has been taking.  I was finally able to reach her daughter Josefina Pleger.  Ms. Katrinka Blazing does not live with her mother and was unable to provide information regarding which medications her mother is taking.  She says her mother lives with her sister, Ms. Marlyne Beards, however I am unable to reach her at the telephone number in EPIC.  Ms Katrinka Blazing tells me she cannot find the telephone number for her siblings but that she will look for it.  She tells me she will be able to get the message to her mother to stop taking K for now and come into clinic tomorrow for lab work.  I left hospital number with her and asked that she have Ms. Marlyne Beards call the number and ask for Medical Resident on Call in order to reach me this evening.    I have also left a message on the voicemail of Mr. Talese Dejulio, Ms. Gripp's son.  I left the number with instruction to call me back this evening.  I have left a message with [facility] front desk to attempt to contact Ms. Amy in the morning to come in for labs.

## 2013-11-22 ENCOUNTER — Other Ambulatory Visit: Payer: Self-pay | Admitting: Internal Medicine

## 2013-11-22 DIAGNOSIS — E876 Hypokalemia: Secondary | ICD-10-CM

## 2013-11-22 DIAGNOSIS — E079 Disorder of thyroid, unspecified: Secondary | ICD-10-CM

## 2013-11-24 ENCOUNTER — Encounter: Payer: Self-pay | Admitting: Internal Medicine

## 2013-11-24 ENCOUNTER — Ambulatory Visit (INDEPENDENT_AMBULATORY_CARE_PROVIDER_SITE_OTHER): Payer: PRIVATE HEALTH INSURANCE | Admitting: Internal Medicine

## 2013-11-24 VITALS — BP 156/98 | HR 103 | Temp 98.5°F | Wt 159.1 lb

## 2013-11-24 DIAGNOSIS — E876 Hypokalemia: Secondary | ICD-10-CM

## 2013-11-24 DIAGNOSIS — E119 Type 2 diabetes mellitus without complications: Secondary | ICD-10-CM

## 2013-11-24 DIAGNOSIS — E079 Disorder of thyroid, unspecified: Secondary | ICD-10-CM

## 2013-11-24 LAB — GLUCOSE, CAPILLARY: Glucose-Capillary: 107 mg/dL — ABNORMAL HIGH (ref 70–99)

## 2013-11-24 LAB — BASIC METABOLIC PANEL WITH GFR
BUN: 11 mg/dL (ref 6–23)
CO2: 26 mEq/L (ref 19–32)
Calcium: 9.2 mg/dL (ref 8.4–10.5)
Chloride: 98 mEq/L (ref 96–112)
Creat: 0.62 mg/dL (ref 0.50–1.10)
GFR, EST NON AFRICAN AMERICAN: 82 mL/min
GLUCOSE: 112 mg/dL — AB (ref 70–99)
Potassium: 3.7 mEq/L (ref 3.5–5.3)
SODIUM: 140 meq/L (ref 135–145)

## 2013-11-24 LAB — POCT GLYCOSYLATED HEMOGLOBIN (HGB A1C): Hemoglobin A1C: 6.7

## 2013-11-24 NOTE — Assessment & Plan Note (Signed)
Well controlled on current regimen A1C less than 7. Continue current management.

## 2013-11-24 NOTE — Patient Instructions (Addendum)
Your potassium is normal. Continue taking the potassium at 20 meq each day.

## 2013-11-24 NOTE — Progress Notes (Signed)
Patient ID: [name], female   DOB: [birth year], 80 y.o.   MRN: [id number]    Subjective:   Patient ID: [name] female   DOB: 14-Mar-1926 80 y.o.   MRN: [id number]  HPI: [name] is a 80 y.o. woman whith a pmhx detailed below who comes to clinic for potassium check. The patient was on K supplement, but this was d/c as patients K was up to 4.7 However, the patient has continued to take potassium. Her most recent dose was this morning. After speaking with the patients PCP, it was decided to bring the patient into clinic to check K and adjust supplementation as needed.  The patient has no complaints today. She dopes admit to mild LE swelling. She denies SOB, Cough, CP. She denies HA. She states that she is doing well.    Past Medical History  Diagnosis Date  . Hypertension   . Diabetes mellitus   . Asthma   . Osteoarthritis     left shoulder  . Pseudogout   . Hypokalemia     a. Previously felt due to diuretics, req supplementation.  . Churg-Strauss syndrome     a. Sural nerve biopsy 02/2000.  Darene Lamer disease     a. s/p thyroidectomy.  . Glaucoma   . Hypercholesteremia   . Venous insufficiency   . Osteopenia     DEXA 2004  . Lipoma     left inner thigh  . Cataract     left eye  . VASCULITIS   . Chronic combined systolic and diastolic heart failure     a. 09/5001 EF:20-25%  . Pleural effusion, left     a. Thoracentesis 2001 - per notes, no malignant cells, was transudative.  Marland Kitchen NSVT (nonsustained ventricular tachycardia)     a. During CHF adm 2001.  . Multifocal atrial tachycardia     a. Documented as OP 01/2012.  Marland Kitchen Ectopic atrial tachycardia     a. Documented on tele as IP 02/2012.  . Pulmonary HTN     a. Mod by echo 02/2012.  . Valvular heart disease     a. Echo 02/2012: mod MR/TR, mild AI.  Marland Kitchen Pericardial effusion     a. Echo 02/2012: small-mod pericardial effusion.  . Atrial fibrillation, permanent     a. Dx 04/2013, on metoprolol, dig and xarelto   Current  Outpatient Prescriptions  Medication Sig Dispense Refill  . albuterol (PROVENTIL HFA;VENTOLIN HFA) 108 (90 BASE) MCG/ACT inhaler Inhale 1-2 puffs into the lungs every 6 (six) hours as needed for shortness of breath.  18 g  11  . brimonidine (ALPHAGAN P) 0.1 % SOLN Place 1 drop into the left eye every 12 (twelve) hours.  15 mL  1  . colchicine 0.6 MG tablet Take 1 tablet (0.6 mg total) by mouth daily.  5 tablet  0  . dorzolamide (TRUSOPT) 2 % ophthalmic solution Place 1 drop into the left eye 2 (two) times daily.       . Fluticasone-Salmeterol (ADVAIR) 250-50 MCG/DOSE AEPB Inhale 1 puff into the lungs 2 (two) times daily.  60 each  11  . furosemide (LASIX) 40 MG tablet Take 1 tablet (40 mg total) by mouth 2 (two) times daily.  60 tablet  2  . lisinopril (PRINIVIL,ZESTRIL) 20 MG tablet Take 1 tablet (20 mg total) by mouth daily.  30 tablet  3  . metFORMIN (GLUCOPHAGE) 1000 MG tablet Take 1,000 mg by mouth 2 (two) times daily with a meal.      .  metFORMIN (GLUCOPHAGE) 1000 MG tablet TAKE 1 TABLET BY MOUTH TWICE DAILY WITH A MEAL  60 tablet  3  . metoprolol succinate (TOPROL-XL) 50 MG 24 hr tablet Take 1 tablet (50 mg total) by mouth daily.  30 tablet  2  . potassium chloride (K-DUR) 10 MEQ tablet TAKE 2 TABLETS BY MOUTH DAILY  60 tablet  0  . Rivaroxaban (XARELTO) 20 MG TABS tablet Take 1 tablet (20 mg total) by mouth daily.  30 tablet  3   No current facility-administered medications for this visit.   Family History  Problem Relation Age of Onset  . Diabetes Mother   . Diabetes Father   . Heart disease Neg Hx    History   Social History  . Marital Status: Widowed    Spouse Name: N/A    Number of Children: N/A  . Years of Education: N/A   Occupational History  . Retired    Social History Main Topics  . Smoking status: Never Smoker   . Smokeless tobacco: Never Used  . Alcohol Use: No  . Drug Use: No  . Sexual Activity: Not on file   Other Topics Concern  . Not on file   Social  History Narrative   Lives in house, with family just next door.    Never smoker, no alcohol.      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                   Review of Systems: Pertinent items are noted in HPI. Objective:  Physical Exam: There were no vitals filed for this visit. Physical Exam  Constitutional:  Elderly female.  Cardiovascular: Normal rate.   Murmur heard. Irregularly irregular rhythm  Pulmonary/Chest: Effort normal and breath sounds normal. No respiratory distress. She has no wheezes. She has no rales.  Musculoskeletal:  1+ edema to mid calf bil.  Neurological: She is alert.  Psychiatric: She has a normal mood and affect. Her behavior is normal.    Assessment & Plan:

## 2013-11-24 NOTE — Assessment & Plan Note (Addendum)
Patient had history of hypokalemia. She then developed K of 4.7. K supplementation was stopped but patient had refilled prescription and continued to take. Today her K is 3.7. Plan to continue K at 20 meq qd.

## 2013-11-25 LAB — TSH: TSH: 1.41 u[IU]/mL (ref 0.350–4.500)

## 2013-11-25 NOTE — Progress Notes (Signed)
Case discussed with Dr. Komanski at the time of the visit.  We reviewed the resident's history and exam and pertinent patient test results.  I agree with the assessment, diagnosis, and plan of care documented in the resident's note.      

## 2013-12-07 NOTE — Telephone Encounter (Signed)
Pt was seen in clinic 11/24/13.

## 2013-12-12 ENCOUNTER — Ambulatory Visit: Payer: PRIVATE HEALTH INSURANCE | Admitting: Cardiology

## 2013-12-14 ENCOUNTER — Ambulatory Visit: Payer: PRIVATE HEALTH INSURANCE | Admitting: Cardiology

## 2013-12-14 ENCOUNTER — Encounter (HOSPITAL_COMMUNITY): Payer: Self-pay | Admitting: Emergency Medicine

## 2013-12-14 ENCOUNTER — Inpatient Hospital Stay (HOSPITAL_COMMUNITY)
Admission: EM | Admit: 2013-12-14 | Discharge: 2013-12-18 | DRG: 292 | Disposition: A | Discharge status: Home Health | Payer: PRIVATE HEALTH INSURANCE | Attending: Internal Medicine | Admitting: Internal Medicine

## 2013-12-14 ENCOUNTER — Emergency Department (HOSPITAL_COMMUNITY): Payer: PRIVATE HEALTH INSURANCE

## 2013-12-14 DIAGNOSIS — E875 Hyperkalemia: Secondary | ICD-10-CM

## 2013-12-14 DIAGNOSIS — I482 Chronic atrial fibrillation, unspecified: Secondary | ICD-10-CM | POA: Diagnosis present

## 2013-12-14 DIAGNOSIS — E785 Hyperlipidemia, unspecified: Secondary | ICD-10-CM | POA: Diagnosis present

## 2013-12-14 DIAGNOSIS — M3130 Wegener's granulomatosis without renal involvement: Secondary | ICD-10-CM | POA: Diagnosis present

## 2013-12-14 DIAGNOSIS — I5043 Acute on chronic combined systolic (congestive) and diastolic (congestive) heart failure: Principal | ICD-10-CM | POA: Diagnosis present

## 2013-12-14 DIAGNOSIS — J45909 Unspecified asthma, uncomplicated: Secondary | ICD-10-CM | POA: Diagnosis present

## 2013-12-14 DIAGNOSIS — I872 Venous insufficiency (chronic) (peripheral): Secondary | ICD-10-CM | POA: Diagnosis present

## 2013-12-14 DIAGNOSIS — E876 Hypokalemia: Secondary | ICD-10-CM | POA: Diagnosis present

## 2013-12-14 DIAGNOSIS — I509 Heart failure, unspecified: Secondary | ICD-10-CM | POA: Diagnosis present

## 2013-12-14 DIAGNOSIS — M109 Gout, unspecified: Secondary | ICD-10-CM | POA: Diagnosis present

## 2013-12-14 DIAGNOSIS — I2699 Other pulmonary embolism without acute cor pulmonale: Secondary | ICD-10-CM | POA: Diagnosis present

## 2013-12-14 DIAGNOSIS — I4891 Unspecified atrial fibrillation: Secondary | ICD-10-CM | POA: Diagnosis present

## 2013-12-14 DIAGNOSIS — M899 Disorder of bone, unspecified: Secondary | ICD-10-CM | POA: Diagnosis present

## 2013-12-14 DIAGNOSIS — M1710 Unilateral primary osteoarthritis, unspecified knee: Secondary | ICD-10-CM | POA: Diagnosis present

## 2013-12-14 DIAGNOSIS — Z7901 Long term (current) use of anticoagulants: Secondary | ICD-10-CM

## 2013-12-14 DIAGNOSIS — H409 Unspecified glaucoma: Secondary | ICD-10-CM | POA: Diagnosis present

## 2013-12-14 DIAGNOSIS — I5042 Chronic combined systolic (congestive) and diastolic (congestive) heart failure: Secondary | ICD-10-CM | POA: Diagnosis present

## 2013-12-14 DIAGNOSIS — Z833 Family history of diabetes mellitus: Secondary | ICD-10-CM

## 2013-12-14 DIAGNOSIS — Z86711 Personal history of pulmonary embolism: Secondary | ICD-10-CM

## 2013-12-14 DIAGNOSIS — M949 Disorder of cartilage, unspecified: Secondary | ICD-10-CM

## 2013-12-14 DIAGNOSIS — I5041 Acute combined systolic (congestive) and diastolic (congestive) heart failure: Secondary | ICD-10-CM

## 2013-12-14 DIAGNOSIS — E119 Type 2 diabetes mellitus without complications: Secondary | ICD-10-CM | POA: Diagnosis present

## 2013-12-14 DIAGNOSIS — I10 Essential (primary) hypertension: Secondary | ICD-10-CM | POA: Diagnosis present

## 2013-12-14 LAB — URINALYSIS, ROUTINE W REFLEX MICROSCOPIC
Bilirubin Urine: NEGATIVE
Glucose, UA: NEGATIVE mg/dL
Ketones, ur: NEGATIVE mg/dL
Leukocytes, UA: NEGATIVE
Nitrite: NEGATIVE
PROTEIN: 100 mg/dL — AB
SPECIFIC GRAVITY, URINE: 1.011 (ref 1.005–1.030)
Urobilinogen, UA: 1 mg/dL (ref 0.0–1.0)
pH: 6.5 (ref 5.0–8.0)

## 2013-12-14 LAB — BASIC METABOLIC PANEL
BUN: 13 mg/dL (ref 6–23)
CO2: 27 mEq/L (ref 19–32)
Calcium: 8.9 mg/dL (ref 8.4–10.5)
Chloride: 99 mEq/L (ref 96–112)
Creatinine, Ser: 0.68 mg/dL (ref 0.50–1.10)
GFR calc Af Amer: 89 mL/min — ABNORMAL LOW (ref >90)
GFR, EST NON AFRICAN AMERICAN: 77 mL/min — AB (ref >90)
Glucose, Bld: 128 mg/dL — ABNORMAL HIGH (ref 70–99)
POTASSIUM: 2.9 meq/L — AB (ref 3.7–5.3)
SODIUM: 140 meq/L (ref 137–147)

## 2013-12-14 LAB — URINE MICROSCOPIC-ADD ON

## 2013-12-14 LAB — CBC WITH DIFFERENTIAL/PLATELET
Basophils Absolute: 0 10*3/uL (ref 0.0–0.1)
Basophils Relative: 1 % (ref 0–1)
Eosinophils Absolute: 0.1 10*3/uL (ref 0.0–0.7)
Eosinophils Relative: 3 % (ref 0–5)
HCT: 35.5 % — ABNORMAL LOW (ref 36.0–46.0)
HEMOGLOBIN: 11.8 g/dL — AB (ref 12.0–15.0)
LYMPHS ABS: 1.1 10*3/uL (ref 0.7–4.0)
LYMPHS PCT: 30 % (ref 12–46)
MCH: 31.5 pg (ref 26.0–34.0)
MCHC: 33.2 g/dL (ref 30.0–36.0)
MCV: 94.7 fL (ref 78.0–100.0)
Monocytes Absolute: 0.6 10*3/uL (ref 0.1–1.0)
Monocytes Relative: 16 % — ABNORMAL HIGH (ref 3–12)
NEUTROS ABS: 1.8 10*3/uL (ref 1.7–7.7)
NEUTROS PCT: 50 % (ref 43–77)
PLATELETS: 195 10*3/uL (ref 150–400)
RBC: 3.75 MIL/uL — AB (ref 3.87–5.11)
RDW: 13.9 % (ref 11.5–15.5)
WBC: 3.6 10*3/uL — AB (ref 4.0–10.5)

## 2013-12-14 LAB — PRO B NATRIURETIC PEPTIDE: Pro B Natriuretic peptide (BNP): 4874 pg/mL — ABNORMAL HIGH (ref 0–450)

## 2013-12-14 MED ORDER — ALBUTEROL SULFATE (2.5 MG/3ML) 0.083% IN NEBU
2.5000 mg | INHALATION_SOLUTION | RESPIRATORY_TRACT | Status: DC | PRN
Start: 2013-12-14 — End: 2013-12-14

## 2013-12-14 MED ORDER — POTASSIUM CHLORIDE 10 MEQ/100ML IV SOLN
10.0000 meq | INTRAVENOUS | Status: DC
  Administered 2013-12-14: 10 meq via INTRAVENOUS
  Filled 2013-12-14: qty 100

## 2013-12-14 MED ORDER — FUROSEMIDE 10 MG/ML IJ SOLN: 80.0000 mg | Freq: Once | INTRAMUSCULAR | Status: DC

## 2013-12-14 MED ORDER — FUROSEMIDE 10 MG/ML IJ SOLN
40.0000 mg | Freq: Once | INTRAMUSCULAR | Status: AC
  Administered 2013-12-14: 40 mg via INTRAVENOUS
  Filled 2013-12-14: qty 4

## 2013-12-14 NOTE — ED Notes (Addendum)
Patient states that she woke up this morning and has just felt weak and she just cant get her breath.  Patient slightly tachypnic, denies chest pain or pain anywhere else, denies nausea, recent fever or chills, skin warm and dry,

## 2013-12-14 NOTE — H&P (Signed)
Date: 12/14/2013               Patient Name:  [name] MRN: [id number]  DOB: [birth year] Age / Sex: 80 y.o., female   PCP: Evelena Peat, DO         Medical Service: Internal Medicine Teaching Service         Attending Physician: Dr. Jonah Blue, DO    First Contact: Dr. Mikey Bussing Pager: [phone]  Second Contact: Dr. Burtis Junes Pager: [phone]       After Hours (After 5p/  First Contact Pager: [phone]  weekends / holidays): Second Contact Pager: [phone]   Chief Complaint: weakness, shortness of breath   History of Present Illness:  [name] is an 80 year old woman with history of DM2 (6.7% on 11/24/13), HTN, HL, CHF (EF 40-45%, grade 3 diastolic dysfunction in 08/2013), atrial fibrillation on Xarelto, recent PE, Grave's disease (s/p thyroidectomy), hypokalemia who presents with weakness and shortness of breath x 1 day.   Patient is a poor historian.  She states that her symptoms began this morning (4/29).  Shortness of breath is worse with exertion (she uses walker at home), and she has also had orthopnea, PND; unclear if she has had any weight gain. She takes Lasix 40 mg BID and reports good compliance.  She has been hospitalized three times in past for heart failure exacerbations.  She was also hospitalized in 08/2013 for submassive PE with evidence of right heart strain.  She was started on Xarelto at that time.  She is not on home O2.  She has also reportedly been compliant with K supplementation (20 meq daily) at home.  Denies lightheadedness, fever, chest pain, cough,  abdominal pain, changes in bowel/bladder habits.   In the ED, patient was initially placed on 3L O2 (now satting 100% on RA) and received Lasix IV 40 mg with some improvement of symptoms.   Meds: Current Facility-Administered Medications  Medication Dose Route Frequency Provider Last Rate Last Dose  . albuterol (PROVENTIL) (2.5 MG/3ML) 0.083% nebulizer solution 2.5 mg  2.5 mg Nebulization Q1H PRN Rolland Porter, MD      .  furosemide (LASIX) injection 40 mg  40 mg Intravenous Once Rolland Porter, MD      . potassium chloride 10 mEq in 100 mL IVPB  10 mEq Intravenous Q1 Hr x 2 Rolland Porter, MD       Current Outpatient Prescriptions  Medication Sig Dispense Refill  . albuterol (PROVENTIL HFA;VENTOLIN HFA) 108 (90 BASE) MCG/ACT inhaler Inhale 1-2 puffs into the lungs every 6 (six) hours as needed for shortness of breath.  18 g  11  . brimonidine (ALPHAGAN P) 0.1 % SOLN Place 1 drop into the left eye every 12 (twelve) hours.  15 mL  1  . colchicine 0.6 MG tablet Take 1 tablet (0.6 mg total) by mouth daily.  5 tablet  0  . dorzolamide (TRUSOPT) 2 % ophthalmic solution Place 1 drop into the left eye 2 (two) times daily.       . Fluticasone-Salmeterol (ADVAIR) 250-50 MCG/DOSE AEPB Inhale 1 puff into the lungs 2 (two) times daily.  60 each  11  . furosemide (LASIX) 40 MG tablet Take 1 tablet (40 mg total) by mouth 2 (two) times daily.  60 tablet  2  . lisinopril (PRINIVIL,ZESTRIL) 20 MG tablet Take 1 tablet (20 mg total) by mouth daily.  30 tablet  3  . metFORMIN (GLUCOPHAGE) 1000 MG tablet Take 1,000 mg by mouth  2 (two) times daily with a meal.      . metoprolol succinate (TOPROL-XL) 50 MG 24 hr tablet Take 1 tablet (50 mg total) by mouth daily.  30 tablet  2  . potassium chloride (K-DUR) 10 MEQ tablet Take 20 mEq by mouth daily.      . Rivaroxaban (XARELTO) 20 MG TABS tablet Take 1 tablet (20 mg total) by mouth daily.  30 tablet  3    Allergies: Allergies as of 12/14/2013  . (No Known Allergies)   Past Medical History  Diagnosis Date  . Hypertension   . Diabetes mellitus   . Asthma   . Osteoarthritis     left shoulder  . Pseudogout   . Hypokalemia     a. Previously felt due to diuretics, req supplementation.  . Churg-Strauss syndrome     a. Sural nerve biopsy 02/2000.  Darene Lamer disease     a. s/p thyroidectomy.  . Glaucoma   . Hypercholesteremia   . Venous insufficiency   . Osteopenia     DEXA 2004  .  Lipoma     left inner thigh  . Cataract     left eye  . VASCULITIS   . Chronic combined systolic and diastolic heart failure     a. 08/6107 EF:20-25%  . Pleural effusion, left     a. Thoracentesis 2001 - per notes, no malignant cells, was transudative.  Marland Kitchen NSVT (nonsustained ventricular tachycardia)     a. During CHF adm 2001.  . Multifocal atrial tachycardia     a. Documented as OP 01/2012.  Marland Kitchen Ectopic atrial tachycardia     a. Documented on tele as IP 02/2012.  . Pulmonary HTN     a. Mod by echo 02/2012.  . Valvular heart disease     a. Echo 02/2012: mod MR/TR, mild AI.  Marland Kitchen Pericardial effusion     a. Echo 02/2012: small-mod pericardial effusion.  . Atrial fibrillation, permanent     a. Dx 04/2013, on metoprolol, dig and xarelto   Past Surgical History  Procedure Laterality Date  . Abdominal hysterectomy    . Salpingoophorectomy    . Thyroidectomy    . Hemiarthroplasty hip Right 10/2010    cemented for hip fracture, femoral neck - by Eulas Post, MD  . Cataract extraction Right   . Hemiarthroplasty hip     Family History  Problem Relation Age of Onset  . Diabetes Mother   . Diabetes Father   . Heart disease Neg Hx    History   Social History  . Marital Status: Widowed    Spouse Name: N/A    Number of Children: N/A  . Years of Education: N/A   Occupational History  . Retired    Social History Main Topics  . Smoking status: Never Smoker   . Smokeless tobacco: Never Used  . Alcohol Use: No  . Drug Use: No  . Sexual Activity: Not on file   Other Topics Concern  . Not on file   Social History Narrative   Lives in house, with family just next door.    Never smoker, no alcohol.      Used to work at [employer].    Before that, worked in food prep.                  Has been living with one of her daughters who assists with medications for past 2 months.  Denies smoking, EtOH, illicits.  Review of Systems: Review of Systems    Constitutional: Negative for fever.  Respiratory: Positive for shortness of breath. Negative for cough and wheezing.   Cardiovascular: Positive for orthopnea and PND. Negative for chest pain, palpitations and leg swelling.  Gastrointestinal: Negative for nausea, vomiting, abdominal pain, diarrhea, constipation and blood in stool.  Genitourinary: Negative for dysuria and frequency.  Musculoskeletal: Negative for back pain and falls.  Neurological: Positive for weakness. Negative for dizziness, loss of consciousness and headaches.    Physical Exam: Blood pressure 156/76, pulse 86, temperature 98.4 F (36.9 C), temperature source Rectal, resp. rate 18, height 5\' 4"  (1.626 m), weight 159 lb (72.122 kg), SpO2 100.00% on RA General: alert, cooperative, and in no apparent distress HEENT: NCAT, vision grossly intact, oropharynx clear and non-erythematous  Neck: supple, no lymphadenopathy Lungs: very mild bibasilar crackles, clear to ascultation bilaterally, normal work of respiration, no wheezes, rales, ronchi Heart: irregularly irregular, 2/6 systolic murmur at LLSB, no gallops, or rubs Abdomen: soft, non-tender, non-distended, normal bowel sounds Extremities: trace edema of BLE, 2+ DP/PT pulses bilaterally, no cyanosis, clubbing Neurologic: alert & oriented X3, cranial nerves II-XII intact, strength grossly intact, sensation intact to light touch   Lab results: Basic Metabolic Panel:  Recent Labs  58/59/29 1846  NA 140  K 2.9*  CL 99  CO2 27  GLUCOSE 128*  BUN 13  CREATININE 0.68  CALCIUM 8.9   CBC:  Recent Labs  12/14/13 1846  WBC 3.6*  NEUTROABS 1.8  HGB 11.8*  HCT 35.5*  MCV 94.7  PLT 195   BNP:  Recent Labs  12/14/13 1846  PROBNP 4874.0*   Urinalysis:  Recent Labs  12/14/13 1907  COLORURINE YELLOW  LABSPEC 1.011  PHURINE 6.5  GLUCOSEU NEGATIVE  HGBUR SMALL*  BILIRUBINUR NEGATIVE  KETONESUR NEGATIVE  PROTEINUR 100*  UROBILINOGEN 1.0  NITRITE  NEGATIVE  LEUKOCYTESUR NEGATIVE    Imaging results:  Dg Chest Port 1 View  12/14/2013   SHORTNESS OF CLINICAL DATA: Shortness of breath, chest pain, atrial fibrillation ; history of pulmonary embolism in January of 2015.  EXAM: PORTABLE CHEST - 1 VIEW  COMPARISON:  CT ANGIO CHEST W/CM &/OR WO/CM dated 08/18/2013  FINDINGS: The lungs are well-expanded. There are coarse lung markings in the retrocardiac region on the left. This area has improved slightly since the previous study. The cardiopericardial silhouette remains enlarged. The pulmonary vascularity is not engorged. There is stable deviation of the trachea towards the right. The observed portions of the bony thorax exhibit no acute abnormalities.  IMPRESSION: There is mild stable enlargement cardiac silhouette without overt evidence of pulmonary edema. This likely reflects low-grade compensated CHF. There is density in the retrocardiac region which is not entirely new and likely reflects atelectasis or infiltrate. Contrast-enhanced chest CT scanning may be of value if the patient is having significant cardiopulmonary symptoms.   Electronically Signed   By: David  [place]   On: 12/14/2013 19:23    Other results: EKG: atrial fibrillation with rate of 80, PVCs, unchanged from prior  Assessment & Plan by Problem: #Acute CHF(combined systolic and diastolic) exacerbation- Patient presented with SOB, weakness x 1 day, + orthopnea, PND.  Per chart review, weight 151 at discharge on 08/24/13 --> 159 on admission.  She takes Lasix 40 mg PO BID as well as Toprol-XL 50 mg daily at home with good medication compliance.  Patient not on any O2 during interview and exam, satting 99-100% on RA.  On exam, mild bibasilar crackles  appreciated as well as trace pitting edema of BLE.  CXR showed stable mild enlargement of cardiac silhouette without evidence of pulmonary edema; old retrocardiac density also appreciated.  pBNP 4874 on admission, increased from 4185 in 04/2013  (during previous admission for CHF exac).  Echo in 08/2013 showed combined systolic and diastolic dysfunction with EF 40-45%, grade 3 diastolic dysfunction (though improved from prior).  S/p Lasix IV 40 mg in ED.   -admit to IMTS telemetry  -Lasix IV 40 mg BID -cycle troponins (7pm, 1am, 7am) -continue home Toprol-XL 50 mg daily, lisinopril, statin -strict Is and Os, daily weights -consider PT/OT consult  -repeat EKG in AM -BMP in AM   #Hypokalemia- K 2.9 on admission.  Patient reportedly compliant with home potassium supplementation.  K IV 10meq x 2 initiated in ED.  -K-dur 40 meq now and at 3am -BMP, Mg in AM   #Chronic atrial fibrillation- On admission, patient's pulse irregularly irregular, HR 80s. On Xarelto at home.  TSH 1.4 (within normal limits) on 11/24/13.  Of note, she has also had multifocal atrial tachycardia in past.  Need to replete potassium per above, goal K of 4.   #DM2- A1C 6.7% on 11/24/13, on metformin 1000 mg BID at home.  Holding metformin while inpatient, will cover with SSI.  -CBGs AC/hs -SSI-sensitive -heart healthy carb mod diet   #HTN- BP elevated at 160s systolic in ED. On Toprol-XL 50 mg daily, lisinopril 20 mg daily.  -continue home medications -continue to monitor on Lasix therapy  # DVT PPX- Xarelto   #Code status- Full code   Dispo: Disposition is deferred at this time, awaiting improvement of current medical problems. Anticipated discharge in approximately 1-2 day(s).   The patient does have a current PCP Evelena Peat(Alex Wilson, DO) and [facility].   Signed: Rocco SereneMorgan Marytza Grandpre, MD 12/14/2013, 10:26 PM

## 2013-12-14 NOTE — ED Notes (Signed)
Per EMS: patient woke up this morning around 0800 and felt weak, had some difficulty breathing, called EMS this afternoon, upon EMS arrival, patient was found to be 89% on room air, not on home oxygen, patient was afib on the monitor, rate 90-130, has history of same, BP: 184/115 history of HTN.  Placed on 3 L nasal cannula and brought satsup to 100%.

## 2013-12-14 NOTE — ED Notes (Signed)
Called lab to inquire about adding on BNP value. Lab stated lab value could be measured from available samples.

## 2013-12-14 NOTE — ED Notes (Signed)
Portable chest outside room 

## 2013-12-14 NOTE — ED Notes (Signed)
Patient voided, cath not needed.

## 2013-12-15 ENCOUNTER — Inpatient Hospital Stay (HOSPITAL_COMMUNITY): Payer: PRIVATE HEALTH INSURANCE

## 2013-12-15 DIAGNOSIS — I10 Essential (primary) hypertension: Secondary | ICD-10-CM

## 2013-12-15 DIAGNOSIS — I5041 Acute combined systolic (congestive) and diastolic (congestive) heart failure: Secondary | ICD-10-CM

## 2013-12-15 DIAGNOSIS — E119 Type 2 diabetes mellitus without complications: Secondary | ICD-10-CM

## 2013-12-15 DIAGNOSIS — I4891 Unspecified atrial fibrillation: Secondary | ICD-10-CM

## 2013-12-15 DIAGNOSIS — E876 Hypokalemia: Secondary | ICD-10-CM

## 2013-12-15 DIAGNOSIS — I509 Heart failure, unspecified: Secondary | ICD-10-CM

## 2013-12-15 LAB — BASIC METABOLIC PANEL
BUN: 10 mg/dL (ref 6–23)
BUN: 10 mg/dL (ref 6–23)
CALCIUM: 8.6 mg/dL (ref 8.4–10.5)
CHLORIDE: 100 meq/L (ref 96–112)
CO2: 27 meq/L (ref 19–32)
CO2: 29 mEq/L (ref 19–32)
CREATININE: 0.54 mg/dL (ref 0.50–1.10)
Calcium: 8.5 mg/dL (ref 8.4–10.5)
Chloride: 100 mEq/L (ref 96–112)
Creatinine, Ser: 0.57 mg/dL (ref 0.50–1.10)
GFR calc Af Amer: >90 mL/min (ref >90)
GFR calc Af Amer: >90 mL/min (ref >90)
GFR calc non Af Amer: 83 mL/min — ABNORMAL LOW (ref >90)
GFR, EST NON AFRICAN AMERICAN: 82 mL/min — AB (ref >90)
GLUCOSE: 216 mg/dL — AB (ref 70–99)
GLUCOSE: 161 mg/dL — AB (ref 70–99)
POTASSIUM: 3.6 meq/L — AB (ref 3.7–5.3)
Potassium: 3 mEq/L — ABNORMAL LOW (ref 3.7–5.3)
SODIUM: 140 meq/L (ref 137–147)
Sodium: 141 mEq/L (ref 137–147)

## 2013-12-15 LAB — TROPONIN I
Troponin I: <0.3 ng/mL (ref <0.30)
Troponin I: <0.3 ng/mL (ref <0.30)

## 2013-12-15 LAB — GLUCOSE, CAPILLARY
GLUCOSE-CAPILLARY: 158 mg/dL — AB (ref 70–99)
Glucose-Capillary: 114 mg/dL — ABNORMAL HIGH (ref 70–99)
Glucose-Capillary: 100 mg/dL — ABNORMAL HIGH (ref 70–99)
Glucose-Capillary: 154 mg/dL — ABNORMAL HIGH (ref 70–99)
Glucose-Capillary: 111 mg/dL — ABNORMAL HIGH (ref 70–99)

## 2013-12-15 LAB — URINE CULTURE
Colony Count: >=100000
SPECIAL REQUESTS: NORMAL

## 2013-12-15 LAB — MAGNESIUM: Magnesium: 1.5 mg/dL (ref 1.5–2.5)

## 2013-12-15 MED ORDER — POTASSIUM CHLORIDE ER 10 MEQ PO TBCR: 40.0000 meq | EXTENDED_RELEASE_TABLET | Freq: Once | ORAL | Status: DC

## 2013-12-15 MED ORDER — POTASSIUM CHLORIDE CRYS ER 20 MEQ PO TBCR: 40.0000 meq | EXTENDED_RELEASE_TABLET | Freq: Once | ORAL | Status: DC

## 2013-12-15 MED ORDER — ALBUTEROL SULFATE HFA 108 (90 BASE) MCG/ACT IN AERS: 1.0000 | INHALATION_SPRAY | Freq: Four times a day (QID) | RESPIRATORY_TRACT | Status: DC | PRN

## 2013-12-15 MED ORDER — SODIUM CHLORIDE 0.9 % IV SOLN: 250.0000 mL | INTRAVENOUS | Status: DC | PRN

## 2013-12-15 MED ORDER — MOMETASONE FURO-FORMOTEROL FUM 100-5 MCG/ACT IN AERO
2.0000 | INHALATION_SPRAY | Freq: Two times a day (BID) | RESPIRATORY_TRACT | Status: DC
  Administered 2013-12-15 – 2013-12-18 (×7): 2 via RESPIRATORY_TRACT
  Filled 2013-12-15: qty 8.8

## 2013-12-15 MED ORDER — MAGNESIUM SULFATE 40 MG/ML IJ SOLN
2.0000 g | Freq: Once | INTRAMUSCULAR | Status: AC
  Administered 2013-12-15: 2 g via INTRAVENOUS
  Filled 2013-12-15: qty 50

## 2013-12-15 MED ORDER — ALBUTEROL SULFATE (2.5 MG/3ML) 0.083% IN NEBU: 2.5000 mg | INHALATION_SOLUTION | Freq: Four times a day (QID) | RESPIRATORY_TRACT | Status: DC | PRN

## 2013-12-15 MED ORDER — DORZOLAMIDE HCL 2 % OP SOLN
1.0000 [drp] | Freq: Two times a day (BID) | OPHTHALMIC | Status: DC
  Administered 2013-12-15 – 2013-12-18 (×7): 1 [drp] via OPHTHALMIC
  Filled 2013-12-15: qty 10

## 2013-12-15 MED ORDER — FUROSEMIDE 10 MG/ML IJ SOLN
40.0000 mg | Freq: Two times a day (BID) | INTRAMUSCULAR | Status: DC
  Administered 2013-12-15 – 2013-12-16 (×5): 40 mg via INTRAVENOUS
  Filled 2013-12-15 (×8): qty 4

## 2013-12-15 MED ORDER — RIVAROXABAN 20 MG PO TABS
20.0000 mg | ORAL_TABLET | Freq: Every day | ORAL | Status: DC
  Administered 2013-12-15 – 2013-12-17 (×3): 20 mg via ORAL
  Filled 2013-12-15 (×4): qty 1

## 2013-12-15 MED ORDER — SODIUM CHLORIDE 0.9 % IJ SOLN
3.0000 mL | Freq: Two times a day (BID) | INTRAMUSCULAR | Status: DC
Start: 2013-12-15 — End: 2013-12-18
  Administered 2013-12-15 – 2013-12-18 (×7): 3 mL via INTRAVENOUS

## 2013-12-15 MED ORDER — INSULIN ASPART 100 UNIT/ML ~~LOC~~ SOLN
0.0000 [IU] | Freq: Three times a day (TID) | SUBCUTANEOUS | Status: DC
  Administered 2013-12-15: 2 [IU] via SUBCUTANEOUS
  Administered 2013-12-16: 1 [IU] via SUBCUTANEOUS
  Administered 2013-12-16: 2 [IU] via SUBCUTANEOUS
  Administered 2013-12-16 – 2013-12-17 (×2): 1 [IU] via SUBCUTANEOUS
  Administered 2013-12-17: 3 [IU] via SUBCUTANEOUS
  Administered 2013-12-17: 5 [IU] via SUBCUTANEOUS
  Administered 2013-12-18: 3 [IU] via SUBCUTANEOUS
  Administered 2013-12-18: 5 [IU] via SUBCUTANEOUS

## 2013-12-15 MED ORDER — SODIUM CHLORIDE 0.9 % IJ SOLN: 3.0000 mL | INTRAMUSCULAR | Status: DC | PRN

## 2013-12-15 MED ORDER — BRIMONIDINE TARTRATE 0.2 % OP SOLN
1.0000 [drp] | Freq: Two times a day (BID) | OPHTHALMIC | Status: DC
  Administered 2013-12-15 – 2013-12-18 (×7): 1 [drp] via OPHTHALMIC
  Filled 2013-12-15: qty 5

## 2013-12-15 MED ORDER — POTASSIUM CHLORIDE CRYS ER 20 MEQ PO TBCR
40.0000 meq | EXTENDED_RELEASE_TABLET | Freq: Once | ORAL | Status: AC
  Administered 2013-12-15: 40 meq via ORAL
  Filled 2013-12-15: qty 2

## 2013-12-15 MED ORDER — LISINOPRIL 20 MG PO TABS
20.0000 mg | ORAL_TABLET | Freq: Every day | ORAL | Status: DC
  Administered 2013-12-15 – 2013-12-18 (×4): 20 mg via ORAL
  Filled 2013-12-15 (×4): qty 1

## 2013-12-15 MED ORDER — METOPROLOL SUCCINATE ER 50 MG PO TB24
50.0000 mg | ORAL_TABLET | Freq: Every day | ORAL | Status: DC
  Administered 2013-12-15 – 2013-12-18 (×4): 50 mg via ORAL
  Filled 2013-12-15 (×4): qty 1

## 2013-12-15 MED ORDER — POTASSIUM CHLORIDE CRYS ER 20 MEQ PO TBCR
40.0000 meq | EXTENDED_RELEASE_TABLET | Freq: Two times a day (BID) | ORAL | Status: DC
  Administered 2013-12-15 – 2013-12-17 (×6): 40 meq via ORAL
  Filled 2013-12-15 (×8): qty 2

## 2013-12-15 MED ORDER — POTASSIUM CHLORIDE 10 MEQ/100ML IV SOLN
10.0000 meq | INTRAVENOUS | Status: AC
  Administered 2013-12-15: 10 meq via INTRAVENOUS
  Filled 2013-12-15 (×2): qty 100

## 2013-12-15 NOTE — H&P (Signed)
INTERNAL MEDICINE TEACHING SERVICE Attending Admission Note  Date: 12/15/2013  Patient name: [name]  Medical record number: [id number]  Date of birth: May 09, 1926    I have seen and evaluated [name] and discussed their care with the Residency Team.  80 yr old woman with hx HFrEF (EF 40%), Afib on xarelto, Type 2 DM, Grave's disease, presented with weakness and SOB. She states that her SOB is worse with exertion. She was recently admitted for submassive PE with evidence of right heart strain. She states she has been taking her lasix as prescribed. She also admits to taking her KCl as prescribed. She denies chest pain. She received Lasix 40 mg IV with improvement. She feels better this morning, but states her hands seem weaker and with a little tremor. Filed Vitals:   12/15/13 0500  BP: 161/89  Pulse: 84  Temp: 97.5 F (36.4 C)  Resp: 18   GEN: AAOx3, NAD CV: S1S2, irreg, 3/6 SEM, no r/g. PULM: CTA bilat LE: 2+ pulses, trace edema.  CXR, portable, without evidence of pulmonary edema. She has a small density (retrocardiac which is not entirely clear whether it is infiltrate or atelectasis). She was noted to have a weight gain of about 8 lbs since last discharge in January 2015. She was also noted to have a K of 2.9 on admission. She has ruled out for ACS.  -Repeat CXR as a PA/LAT to further define the density. I don't suspect PNA. -Continue IV Lasix for now at current dose. -Replace K and Mg. Suspect she will need at least 40 meq per day PO of KCl on discharge. -PT/OT. -Uncertain etiology of tremor, but weakness likely due to hypo-k. -rest per resident note.  Jonah Blue, DO, FACP Faculty [facility] Internal Medicine Residency Program 12/15/2013, 10:29 AM

## 2013-12-15 NOTE — Discharge Summary (Signed)
Name: [name] MRN: [id number] DOB: 15-Feb-1926 80 y.o. PCP: Evelena Peat, DO  Date of Admission: 12/14/2013  6:09 PM Date of Discharge: 12/18/2013 Attending Physician: Earl Lagos, MD  Discharge Diagnosis:   Acute on chronic combined systolic and diastolic congestive heart failure   Acute left knee effusion    Hypokalemia   Atrial fibrillation   Diabetes mellitus, type 2   Hypertension  Discharge Medications:   Medication List         albuterol 108 (90 BASE) MCG/ACT inhaler  Commonly known as:  PROVENTIL HFA;VENTOLIN HFA  Inhale 1-2 puffs into the lungs every 6 (six) hours as needed for shortness of breath.     brimonidine 0.1 % Soln  Commonly known as:  ALPHAGAN P  Place 1 drop into the left eye every 12 (twelve) hours.     colchicine 0.6 MG tablet  Take 1 tablet (0.6 mg total) by mouth daily.     Fluticasone-Salmeterol 250-50 MCG/DOSE Aepb  Commonly known as:  ADVAIR  Inhale 1 puff into the lungs 2 (two) times daily.     furosemide 40 MG tablet  Commonly known as:  LASIX  Take 1 tablet (40 mg total) by mouth 2 (two) times daily.     lisinopril 20 MG tablet  Commonly known as:  PRINIVIL,ZESTRIL  Take 1 tablet (20 mg total) by mouth daily.     metFORMIN 1000 MG tablet  Commonly known as:  GLUCOPHAGE  Take 1,000 mg by mouth 2 (two) times daily with a meal.     metoprolol succinate 50 MG 24 hr tablet  Commonly known as:  TOPROL-XL  Take 1 tablet (50 mg total) by mouth daily.     Potassium Chloride ER 20 MEQ Tbcr  Take 40 mEq by mouth daily.     rivaroxaban 20 MG Tabs tablet  Commonly known as:  XARELTO  Take 1 tablet (20 mg total) by mouth daily.     TRUSOPT 2 % ophthalmic solution  Generic drug:  dorzolamide  Place 1 drop into the left eye 2 (two) times daily.        Disposition and follow-up:   Ms.Oliwia Delamora was discharged from [facility] in Stable condition.  At the hospital follow up visit please address:  1.  Volume  status; joint pain  2.  Labs / imaging needed at time of follow-up: BMP (K+)  3.  Pending labs/ test needing follow-up: none  Follow-up Appointments:   Discharge Instructions: Discharge Orders   Future Appointments Provider Department Dept Phone   12/19/2013 8:45 AM Lars Masson, MD [facility] Office [phone]   Future Orders Complete By Expires   (HEART FAILURE PATIENTS) Call MD:  Anytime you have any of the following symptoms: 1) 3 pound weight gain in 24 hours or 5 pounds in 1 week 2) shortness of breath, with or without a dry hacking cough 3) swelling in the hands, feet or stomach 4) if you have to sleep on extra pillows at night in order to breathe.  As directed    (HEART FAILURE PATIENTS) Call MD:  Anytime you have any of the following symptoms: 1) 3 pound weight gain in 24 hours or 5 pounds in 1 week 2) shortness of breath, with or without a dry hacking cough 3) swelling in the hands, feet or stomach 4) if you have to sleep on extra pillows at night in order to breathe.  As directed    Call MD  for:  difficulty breathing, headache or visual disturbances  As directed    Call MD for:  difficulty breathing, headache or visual disturbances  As directed    Call MD for:  extreme fatigue  As directed    Call MD for:  extreme fatigue  As directed    Call MD for:  hives  As directed    Call MD for:  hives  As directed    Call MD for:  persistant dizziness or light-headedness  As directed    Call MD for:  persistant dizziness or light-headedness  As directed    Call MD for:  persistant nausea and vomiting  As directed    Call MD for:  persistant nausea and vomiting  As directed    Call MD for:  severe uncontrolled pain  As directed    Call MD for:  severe uncontrolled pain  As directed    Call MD for:  temperature >100.4  As directed    Call MD for:  temperature >100.4  As directed    Diet - low sodium heart healthy  As directed    Diet - low sodium heart healthy  As  directed    Increase activity slowly  As directed    Increase activity slowly  As directed       Consultations:    Procedures Performed:  Dg Chest 2 View  12/15/2013   CLINICAL DATA:  Shortness of breath.  Cardiomyopathy.  EXAM: CHEST  2 VIEW  COMPARISON:  Chest x-rays dated 12/14/2013 and 08/18/2013  FINDINGS: There is prominent chronic cardiomegaly with tortuosity and calcification of the thoracic aorta. Main pulmonary arteries are quite prominent on a chronic basis. There is no pulmonary vascular congestion. There are no acute infiltrates or effusions. There is a chronic area of scarring or bronchial impaction at the left lung base, unchanged since 08/18/2013.  IMPRESSION: No acute abnormality.  Chronic cardiomegaly.   Electronically Signed   By: Geanie Cooley M.D.   On: 12/15/2013 16:45   Dg Chest Port 1 View  12/14/2013   SHORTNESS OF CLINICAL DATA: Shortness of breath, chest pain, atrial fibrillation ; history of pulmonary embolism in January of 2015.  EXAM: PORTABLE CHEST - 1 VIEW  COMPARISON:  CT ANGIO CHEST W/CM &/OR WO/CM dated 08/18/2013  FINDINGS: The lungs are well-expanded. There are coarse lung markings in the retrocardiac region on the left. This area has improved slightly since the previous study. The cardiopericardial silhouette remains enlarged. The pulmonary vascularity is not engorged. There is stable deviation of the trachea towards the right. The observed portions of the bony thorax exhibit no acute abnormalities.  IMPRESSION: There is mild stable enlargement cardiac silhouette without overt evidence of pulmonary edema. This likely reflects low-grade compensated CHF. There is density in the retrocardiac region which is not entirely new and likely reflects atelectasis or infiltrate. Contrast-enhanced chest CT scanning may be of value if the patient is having significant cardiopulmonary symptoms.   Electronically Signed   By: David  [place]   On: 12/14/2013 19:23    Admission HPI:  Ms. Cheshire is an 80 year old woman with history of DM2 (6.7% on 11/24/13), HTN, HL, CHF (EF 40-45%, grade 3 diastolic dysfunction in 08/2013), atrial fibrillation on Xarelto, recent PE, Grave's disease (s/p thyroidectomy), hypokalemia who presents with weakness and shortness of breath x 1 day.  Patient is a poor historian. She states that her symptoms began this morning (4/29). Shortness of breath is worse with exertion (  she uses walker at home), and she has also had orthopnea, PND; unclear if she has had any weight gain. She takes Lasix 40 mg BID and reports good compliance. She has been hospitalized three times in past for heart failure exacerbations. She was also hospitalized in 08/2013 for submassive PE with evidence of right heart strain. She was started on Xarelto at that time. She is not on home O2. She has also reportedly been compliant with K supplementation (20 meq daily) at home. Denies lightheadedness, fever, chest pain, cough, abdominal pain, changes in bowel/bladder habits.  In the ED, patient was initially placed on 3L O2 (now satting 100% on RA) and received Lasix IV 40 mg with some improvement of symptoms.    Hospital Course by problem list:  Acute on chronic combined systolic and diastolic congestive heart failure Patient presented with increased SOB, increased weight (~8lbs), and elevated proBNP of 4800.  She has known combined systolic and diastolic congestive heart failure.  She was started on IV lasix 40mg  BID, she diuresed well with net negative 1.9L output and 7 pound weight loss.   She was evaluated by physical therapy while inpatient who recommended SNF rehab.  Both the patient and her family declined SNF and the patient would like to be discharged home.  She reportedly has 24 hour supervision at home.  The patient requested transportation assistance to and from doctor's appointments.  This was arranged prior to discharge.  She was discharged to home in stable condition.  Hospital  follow-up [facility]  She has a cardiology appointment on 12/19/2013.  Hypokalemia Patient presented with potassium of 2.9 she complained of generalized weakness and muscle cramps.  Her potassium and magnesium were supplemented while inpaitent and she had improvement in her cramping.  She was discharged on KDur 40mEq daily.  Left knee effusion: Patient reported recent worsening pain in left leg that limited her mobility and ability to bear weight.  There were no signs and symptoms of DVT.  Tib/fib xray of her left leg revealed left knee DJD and a large effusion.  She has a history of gout/pseudogout (not previously confirmed by aspiration) but her knee is not usually the joint affected.  Though she denied knee pain, her leg pain was likely related to this effusion and  It resolved after left knee aspiration and steroid injection.  Synovial fluid was not sent for analysis, however Orthopedics felt fluid was consistent with OA with possible component of gout.  Will plan to continue colchicine at discharge.  Home health PT has been arranged.  Diabetes mellitus type 2 Home metformin was held during admission.  Her blood glucose remained well controlled with diet and sensitive sliding scale insulin.  Will resume metformin at discharge.  Hypertension She was continued on her home medications.  Atrial fibrillation Her rate was controlled.  She was continued on metoprolol for rate control and Xarelto for anticoagulation.   Discharge Vitals:   BP 129/66  Pulse 88  Temp(Src) 97.8 F (36.6 C) (Oral)  Resp 18  Ht 5\' 4"  (1.626 m)  Wt 69.128 kg (152 lb 6.4 oz)  BMI 26.15 kg/m2  SpO2 91%  Discharge Labs:  Results for orders placed during the hospital encounter of 12/14/13 (from the past 24 hour(s))  GLUCOSE, CAPILLARY     Status: Abnormal   Collection Time    12/17/13  5:05 PM      Result Value Ref Range   Glucose-Capillary  291 (*) 70 - 99  mg/dL   Comment 1 Documented in Chart     Comment 2 Notify RN    BASIC METABOLIC PANEL     Status: Abnormal   Collection Time    12/18/13  3:41 AM      Result Value Ref Range   Sodium 135 (*) 137 - 147 mEq/L   Potassium 5.0  3.7 - 5.3 mEq/L   Chloride 99  96 - 112 mEq/L   CO2 26  19 - 32 mEq/L   Glucose, Bld 237 (*) 70 - 99 mg/dL   BUN 22  6 - 23 mg/dL   Creatinine, Ser 4.09  0.50 - 1.10 mg/dL   Calcium 9.0  8.4 - 81.1 mg/dL   GFR calc non Af Amer 79 (*) >90 mL/min   GFR calc Af Amer >90  >90 mL/min  BASIC METABOLIC PANEL     Status: Abnormal   Collection Time    12/18/13 10:30 AM      Result Value Ref Range   Sodium 136 (*) 137 - 147 mEq/L   Potassium 4.5  3.7 - 5.3 mEq/L   Chloride 98  96 - 112 mEq/L   CO2 25  19 - 32 mEq/L   Glucose, Bld 332 (*) 70 - 99 mg/dL   BUN 23  6 - 23 mg/dL   Creatinine, Ser 9.14  0.50 - 1.10 mg/dL   Calcium 9.2  8.4 - 78.2 mg/dL   GFR calc non Af Amer 78 (*) >90 mL/min   GFR calc Af Amer >90  >90 mL/min    Signed: Carlynn Purl, DO 12/15/2013, 5:20 PM   I have seen and examined Ms. Paganelli on 12/18/2013.  She is stable for discharge home today.  Evelena Peat, DO 12/18/2013  Time Spent on Discharge: 35 minutes Services Ordered on Discharge: Home health PT and RN; transportation assistance Equipment Ordered on Discharge: none

## 2013-12-15 NOTE — ED Provider Notes (Signed)
CSN: [id number]     Arrival date & time 12/14/13  1809 History   First MD Initiated Contact with Patient 12/14/13 1829     Chief Complaint  Patient presents with  . Atrial Fibrillation      The history is provided by the patient. No language interpreter was used.    Patient presents with weakness. Difficulty breathing weakness for the last 48 hours. Worsening since this morning. 88-89% unremarkable. Not on home oxygen. Patient has a history of chronic A. fib. Is on rate control and Xarelto for this. Saturations 100% on 3 L nasal cannula. Denies chest pain. Has been eating and drinking well. Her daughter is compliant with her Lasix. Not compliant with her potassium.  Past Medical History  Diagnosis Date  . Hypertension   . Diabetes mellitus   . Asthma   . Osteoarthritis     left shoulder  . Pseudogout   . Hypokalemia     a. Previously felt due to diuretics, req supplementation.  . Churg-Strauss syndrome     a. Sural nerve biopsy 02/2000.  Darene Lamer disease     a. s/p thyroidectomy.  . Glaucoma   . Hypercholesteremia   . Venous insufficiency   . Osteopenia     DEXA 2004  . Lipoma     left inner thigh  . Cataract     left eye  . VASCULITIS   . Chronic combined systolic and diastolic heart failure     a. 11/979 EF:20-25%  . Pleural effusion, left     a. Thoracentesis 2001 - per notes, no malignant cells, was transudative.  Marland Kitchen NSVT (nonsustained ventricular tachycardia)     a. During CHF adm 2001.  . Multifocal atrial tachycardia     a. Documented as OP 01/2012.  Marland Kitchen Ectopic atrial tachycardia     a. Documented on tele as IP 02/2012.  . Pulmonary HTN     a. Mod by echo 02/2012.  . Valvular heart disease     a. Echo 02/2012: mod MR/TR, mild AI.  Marland Kitchen Pericardial effusion     a. Echo 02/2012: small-mod pericardial effusion.  . Atrial fibrillation, permanent     a. Dx 04/2013, on metoprolol, dig and xarelto   Past Surgical History  Procedure Laterality Date  . Abdominal  hysterectomy    . Salpingoophorectomy    . Thyroidectomy    . Hemiarthroplasty hip Right 10/2010    cemented for hip fracture, femoral neck - by Eulas Post, MD  . Cataract extraction Right   . Hemiarthroplasty hip     Family History  Problem Relation Age of Onset  . Diabetes Mother   . Diabetes Father   . Heart disease Neg Hx    History  Substance Use Topics  . Smoking status: Never Smoker   . Smokeless tobacco: Never Used  . Alcohol Use: No   OB History   Grav Para Term Preterm Abortions TAB SAB Ect Mult Living                 Review of Systems  Constitutional: Negative for fever, chills, diaphoresis, appetite change and fatigue.  HENT: Negative for mouth sores, sore throat and trouble swallowing.   Eyes: Negative for visual disturbance.  Respiratory: Positive for shortness of breath. Negative for cough, chest tightness and wheezing.   Cardiovascular: Negative for chest pain.  Gastrointestinal: Negative for nausea, vomiting, abdominal pain, diarrhea and abdominal distention.  Endocrine: Negative for polydipsia, polyphagia and polyuria.  Genitourinary:  Negative for dysuria, frequency and hematuria.  Musculoskeletal: Negative for gait problem.  Skin: Negative for color change, pallor and rash.  Neurological: Positive for weakness. Negative for dizziness, syncope, light-headedness and headaches.  Hematological: Does not bruise/bleed easily.  Psychiatric/Behavioral: Negative for behavioral problems and confusion.      Allergies  Review of patient's allergies indicates no known allergies.  Home Medications   Prior to Admission medications   Medication Sig Start Date End Date Taking? Authorizing Provider  albuterol (PROVENTIL HFA;VENTOLIN HFA) 108 (90 BASE) MCG/ACT inhaler Inhale 1-2 puffs into the lungs every 6 (six) hours as needed for shortness of breath. 06/14/13  Yes Rocco SereneLawrence D Klima, MD  brimonidine (ALPHAGAN P) 0.1 % SOLN Place 1 drop into the left eye every  12 (twelve) hours. 05/27/12  Yes Lars MageAnkit Garg, MD  colchicine 0.6 MG tablet Take 1 tablet (0.6 mg total) by mouth daily. 08/25/13  Yes Evelena PeatAlex Wilson, DO  dorzolamide (TRUSOPT) 2 % ophthalmic solution Place 1 drop into the left eye 2 (two) times daily.    Yes Historical Provider, MD  Fluticasone-Salmeterol (ADVAIR) 250-50 MCG/DOSE AEPB Inhale 1 puff into the lungs 2 (two) times daily. 06/15/13  Yes Rocco SereneLawrence D Klima, MD  furosemide (LASIX) 40 MG tablet Take 1 tablet (40 mg total) by mouth 2 (two) times daily. 09/20/13  Yes Evelena PeatAlex Wilson, DO  lisinopril (PRINIVIL,ZESTRIL) 20 MG tablet Take 1 tablet (20 mg total) by mouth daily. 08/23/13  Yes Otis BraceMarjan Rabbani, MD  metFORMIN (GLUCOPHAGE) 1000 MG tablet Take 1,000 mg by mouth 2 (two) times daily with a meal.   Yes Historical Provider, MD  metoprolol succinate (TOPROL-XL) 50 MG 24 hr tablet Take 1 tablet (50 mg total) by mouth daily. 08/23/13  Yes Marjan Rabbani, MD  potassium chloride (K-DUR) 10 MEQ tablet Take 20 mEq by mouth daily.   Yes Historical Provider, MD  Rivaroxaban (XARELTO) 20 MG TABS tablet Take 1 tablet (20 mg total) by mouth daily. 09/09/13  Yes Marjan Rabbani, MD   BP 156/103  Pulse 79  Temp(Src) 98 F (36.7 C) (Oral)  Resp 20  Ht 5\' 4"  (1.626 m)  Wt 157 lb 11.2 oz (71.532 kg)  BMI 27.06 kg/m2  SpO2 99% Physical Exam  Constitutional: She is oriented to person, place, and time. She appears well-developed and well-nourished. No distress.  Is a thin elderly black female. Slightly hard of hearing but awake and oriented. Offered her own history. Daughter is at the bedside as well.  HENT:  Head: Normocephalic.  Eyes: Conjunctivae are normal. Pupils are equal, round, and reactive to light. No scleral icterus.  Neck: Normal range of motion. Neck supple. No thyromegaly present.  Cardiovascular: An irregularly irregular rhythm present. Exam reveals no gallop and no friction rub.   No murmur heard. A. fib. Controlled rate in the 80s on the monitor.   Pulmonary/Chest: Effort normal. No respiratory distress. She has rales.  Faint bibasilar crackles.  Abdominal: Soft. Bowel sounds are normal. She exhibits no distension. There is no tenderness. There is no rebound.  Musculoskeletal: Normal range of motion.  Neurological: She is alert and oriented to person, place, and time.  Skin: Skin is warm and dry. No rash noted.  Psychiatric: She has a normal mood and affect. Her behavior is normal.    ED Course  Procedures (including critical care time) Labs Review Labs Reviewed  BASIC METABOLIC PANEL - Abnormal; Notable for the following:    Potassium 2.9 (*)    Glucose, Bld 128 (*)  GFR calc non Af Amer 77 (*)    GFR calc Af Amer 89 (*)    All other components within normal limits  CBC WITH DIFFERENTIAL - Abnormal; Notable for the following:    WBC 3.6 (*)    RBC 3.75 (*)    Hemoglobin 11.8 (*)    HCT 35.5 (*)    Monocytes Relative 16 (*)    All other components within normal limits  URINALYSIS, ROUTINE W REFLEX MICROSCOPIC - Abnormal; Notable for the following:    Hgb urine dipstick SMALL (*)    Protein, ur 100 (*)    All other components within normal limits  PRO B NATRIURETIC PEPTIDE - Abnormal; Notable for the following:    Pro B Natriuretic peptide (BNP) 4874.0 (*)    All other components within normal limits  URINE CULTURE  URINE MICROSCOPIC-ADD ON  BASIC METABOLIC PANEL  MAGNESIUM  TROPONIN I  TROPONIN I    Imaging Review Dg Chest Port 1 View  12/14/2013   SHORTNESS OF CLINICAL DATA: Shortness of breath, chest pain, atrial fibrillation ; history of pulmonary embolism in January of 2015.  EXAM: PORTABLE CHEST - 1 VIEW  COMPARISON:  CT ANGIO CHEST W/CM &/OR WO/CM dated 08/18/2013  FINDINGS: The lungs are well-expanded. There are coarse lung markings in the retrocardiac region on the left. This area has improved slightly since the previous study. The cardiopericardial silhouette remains enlarged. The pulmonary vascularity is  not engorged. There is stable deviation of the trachea towards the right. The observed portions of the bony thorax exhibit no acute abnormalities.  IMPRESSION: There is mild stable enlargement cardiac silhouette without overt evidence of pulmonary edema. This likely reflects low-grade compensated CHF. There is density in the retrocardiac region which is not entirely new and likely reflects atelectasis or infiltrate. Contrast-enhanced chest CT scanning may be of value if the patient is having significant cardiopulmonary symptoms.   Electronically Signed   By: David  [place]   On: 12/14/2013 19:23     EKG Interpretation None      MDM   Final diagnoses:  CHF (congestive heart failure)  Hyperkalemia    Patient remains asymptomatic arrest. He was sitting on image the bed feels weak. Has a mild worsening of congestive heart failure. Slight bump of her creatinine. Hypokalemia. To attempt to diurese her at home I feel would make her more hypokalemic. Care discussed with the residents. Patient would be admitted.   Rolland Porter, MD 12/27/13 [phone]

## 2013-12-15 NOTE — Progress Notes (Signed)
Utilization review completed. Obera Stauch, RN, BSN. 

## 2013-12-15 NOTE — Progress Notes (Signed)
Subjective: Feeling a little better today, notes she is urinating frequently.  Notes that she does not think she can walk right now and still feels weak.  She notes that she did have some cramping in her lower extremities that has now resolved. Objective: Vital signs in last 24 hours: Filed Vitals:   12/14/13 2300 12/14/13 2315 12/15/13 0002 12/15/13 0500  BP: 158/90 160/88 156/103 161/89  Pulse: 70 72 79 84  Temp:   98 F (36.7 C) 97.5 F (36.4 C)  TempSrc:   Oral   Resp: 21 25 20 18   Height:   5\' 4"  (1.626 m)   Weight:   157 lb 11.2 oz (71.532 kg) 154 lb (69.854 kg)  SpO2: 99% 99% 99% 98%   Weight change:   Intake/Output Summary (Last 24 hours) at 12/15/13 1105 Last data filed at 12/15/13 0700  Gross per 24 hour  Intake      0 ml  Output   1125 ml  Net  -1125 ml   General: resting in bed, NAD HEENT: PERRL, EOMI Cardiac: RRR 2/6 systolic murmur Pulm: CTAB Abd: soft, nontender, nondistended, BS present Ext: warm and well perfused, trace pedal edema Neuro: alert and oriented X3  Lab Results: Basic Metabolic Panel:  Recent Labs Lab 12/14/13 1846 12/15/13 0136  NA 140 140  K 2.9* 3.0*  CL 99 100  CO2 27 27  GLUCOSE 128* 216*  BUN 13 10  CREATININE 0.68 0.57  CALCIUM 8.9 8.6  MG  --  1.5   Liver Function Tests: No results found for this basename: AST, ALT, ALKPHOS, BILITOT, PROT, ALBUMIN,  in the last 168 hours No results found for this basename: LIPASE, AMYLASE,  in the last 168 hours No results found for this basename: AMMONIA,  in the last 168 hours CBC:  Recent Labs Lab 12/14/13 1846  WBC 3.6*  NEUTROABS 1.8  HGB 11.8*  HCT 35.5*  MCV 94.7  PLT 195   Cardiac Enzymes:  Recent Labs Lab 12/15/13 0136 12/15/13 0556  TROPONINI <0.30 <0.30   BNP:  Recent Labs Lab 12/14/13 1846  PROBNP 4874.0*   D-Dimer: No results found for this basename: DDIMER,  in the last 168 hours CBG:  Recent Labs Lab 12/15/13 0008 12/15/13 0730  GLUCAP 114*  100*   Urinalysis:  Recent Labs Lab 12/14/13 1907  COLORURINE YELLOW  LABSPEC 1.011  PHURINE 6.5  GLUCOSEU NEGATIVE  HGBUR SMALL*  BILIRUBINUR NEGATIVE  KETONESUR NEGATIVE  PROTEINUR 100*  UROBILINOGEN 1.0  NITRITE NEGATIVE  LEUKOCYTESUR NEGATIVE   Micro Results: No results found for this or any previous visit (from the past 240 hour(s)). Studies/Results: Dg Chest Port 1 View  12/14/2013   SHORTNESS OF CLINICAL DATA: Shortness of breath, chest pain, atrial fibrillation ; history of pulmonary embolism in January of 2015.  EXAM: PORTABLE CHEST - 1 VIEW  COMPARISON:  CT ANGIO CHEST W/CM &/OR WO/CM dated 08/18/2013  FINDINGS: The lungs are well-expanded. There are coarse lung markings in the retrocardiac region on the left. This area has improved slightly since the previous study. The cardiopericardial silhouette remains enlarged. The pulmonary vascularity is not engorged. There is stable deviation of the trachea towards the right. The observed portions of the bony thorax exhibit no acute abnormalities.  IMPRESSION: There is mild stable enlargement cardiac silhouette without overt evidence of pulmonary edema. This likely reflects low-grade compensated CHF. There is density in the retrocardiac region which is not entirely new and likely reflects atelectasis or  infiltrate. Contrast-enhanced chest CT scanning may be of value if the patient is having significant cardiopulmonary symptoms.   Electronically Signed   By: David  [place]   On: 12/14/2013 19:23   Medications: I have reviewed the patient's current medications. Scheduled Meds: . brimonidine  1 drop Left Eye BID  . dorzolamide  1 drop Left Eye BID  . furosemide  40 mg Intravenous BID  . insulin aspart  0-9 Units Subcutaneous TID WC  . lisinopril  20 mg Oral Daily  . metoprolol succinate  50 mg Oral Daily  . mometasone-formoterol  2 puff Inhalation BID  . potassium chloride  40 mEq Oral BID  . rivaroxaban  20 mg Oral Q supper  . sodium  chloride  3 mL Intravenous Q12H   Continuous Infusions:  PRN Meds:.sodium chloride, albuterol, sodium chloride Assessment/Plan:   Acute on chronic combined systolic and diastolic congestive heart failure - Net neg 1.1 L - Will repeat CXR- PA/Lat to evaluate for possible infiltrate vs atelectasis contributing - Continue Lasix IV 40mg  BID - K+ supp - Daily weights - Monitor I&O - Ambulate check pulse OX - IS - PT eval and treat - Pulse Ox with ambulation    DIABETES MELLITUS, TYPE II -SSI-S - CBG 4 times daily    HYPERTENSION -Lisinopril 20mg  daily - Lasix per above    Atrial fibrillation - Toprolol XL 50mg  for rate control - Xarelto for A/C    Hypokalemia - Repleated, mag low normal at 1.5 given 2g IV mag.  Will repeat BMP this afternoon.  DVT PPx: Xarelto Dispo: Disposition is deferred at this time, awaiting improvement of current medical problems.  Anticipated discharge in approximately 1 day(s).   The patient does have a current PCP Evelena Peat, DO) and does need an [facility] hospital follow-up appointment after discharge.  The patient does not have transportation limitations that hinder transportation to clinic appointments.  .Services Needed at time of discharge: Y = Yes, Blank = No PT:   OT:   RN:   Equipment:   Other:     LOS: 1 day   Carlynn Purl, DO 12/15/2013, 11:05 AM

## 2013-12-15 NOTE — Evaluation (Signed)
Physical Therapy Evaluation Patient Details Name: [name] MRN: [id number] DOB: [birth year] Today's Date: 12/15/2013   History of Present Illness  Pt admitted with weakness, SOB, hypokalemia. Hx of CHF, AFib, DM, Graves Dz with recent PE  Clinical Impression  Pt with significant change from her reported baseline per son and pt. Pt currently unable to stand without mod-max assist and needs 2 person assist to pivot to [facility] or chair which is not available at home. Recommend SNF for DC unless pt able to  Reach minguard level. Pt with above and below deficits and will benefit from acute therapy to maximize mobility, function, strength, balance and safety to decrease burden of care prior to D/C to next venue of care. Recommend OOB with lift equipment with nursing staff. Pt with sats 95% on RA with limited mobility with HR 104-140 with activity with RN aware and HR back to 110 at rest.     Follow Up Recommendations SNF;Supervision/Assistance - 24 hour    Equipment Recommendations       Recommendations for Other Services OT consult     Precautions / Restrictions Precautions Precautions: Fall      Mobility  Bed Mobility Overal bed mobility: Needs Assistance Bed Mobility: Rolling;Sidelying to Sit Rolling: Min assist Sidelying to sit: Mod assist       General bed mobility comments: max cueing for sequence with hand over hand assist, max assist and cueing with reciprocal scooting and pad to scoot to EOB  Transfers Overall transfer level: Needs assistance Equipment used: Rolling walker (2 wheeled) Transfers: Sit to/from [employer] TechnologiesStand;Stand Pivot Transfers;Squat Pivot Transfers Sit to Stand: Mod assist Stand pivot transfers: Max assist;+2 safety/equipment;+2 physical assistance Squat pivot transfers: Max assist     General transfer comment: pt stood from bed with mod assist and extreme difficulty with sequencing to reach for [facility] and pivot to [facility] with bil knees blocked and assist to pivot  pelvis. From [facility] able to stand with min assist and use of armrests but able to stand only 5 sec before collapsing back onto BSCx 2 trials with RW support. RN assisted with pericare in standing with no AD and pt grasping PT arm and chair armrest with 2 person assist to stand pivot to chair as pt knees buckling and max assist to pivot to chair  Ambulation/Gait                Stairs            Wheelchair Mobility    Modified Rankin (Stroke Patients Only)       Balance Overall balance assessment: Needs assistance Sitting-balance support: Feet supported Sitting balance-Leahy Scale: Poor       Standing balance-Leahy Scale: Zero                               Pertinent Vitals/Pain No pain    Home Living Family/patient expects to be discharged to:: Private residence Living Arrangements: Children Available Help at Discharge: Family;Available 24 hours/day Type of Home: House Home Access: Stairs to enter Entrance Stairs-Rails: None Entrance Stairs-Number of Steps: 2 Home Layout: One level Home Equipment: Walker - 2 wheels;Cane - single point;Toilet riser;Bedside commode Additional Comments: per son and pt; pt was independent with ADLs prior to admission    Prior Function Level of Independence: Independent with assistive device(s)         Comments: family does household chores, pt assists with dishes  Hand Dominance        Extremity/Trunk Assessment   Upper Extremity Assessment: Generalized weakness           Lower Extremity Assessment: RLE deficits/detail;LLE deficits/detail RLE Deficits / Details: hip flexion 3/5, knee extension 4/5, knee flexion 3/5 LLE Deficits / Details: 4/5 hip flexion, knee flexion and extension  Cervical / Trunk Assessment: Kyphotic  Communication   Communication: HOH  Cognition Arousal/Alertness: Awake/alert Behavior During Therapy: Flat affect Overall Cognitive Status: Impaired/Different from baseline Area  of Impairment: Orientation;Safety/judgement;Problem solving Orientation Level: Disoriented to;Time   Memory: Decreased short-term memory   Safety/Judgement: Decreased awareness of deficits;Decreased awareness of safety   Problem Solving: Slow processing;Decreased initiation;Difficulty sequencing;Requires verbal cues;Requires tactile cues      General Comments      Exercises General Exercises - Lower Extremity Heel Slides: AAROM;Seated;Both;10 reps      Assessment/Plan    PT Assessment Patient needs continued PT services  PT Diagnosis Difficulty walking;Generalized weakness;Altered mental status   PT Problem List Decreased strength;Decreased cognition;Decreased activity tolerance;Decreased balance;Decreased safety awareness;Decreased knowledge of use of DME;Decreased mobility;Decreased coordination  PT Treatment Interventions DME instruction;Gait training;Balance training;Therapeutic activities;Therapeutic exercise;Patient/family education;Functional mobility training   PT Goals (Current goals can be found in the Care Plan section) Acute Rehab PT Goals Patient Stated Goal: return home PT Goal Formulation: With patient Time For Goal Achievement: 12/29/13 Potential to Achieve Goals: Fair    Frequency Min 3X/week   Barriers to discharge Decreased caregiver support      Co-evaluation               End of Session Equipment Utilized During Treatment: Gait belt Activity Tolerance: Patient limited by fatigue Patient left: in chair;with call bell/phone within reach;with chair alarm set;with nursing/sitter in room Nurse Communication: Precautions;Need for lift equipment;Mobility status         Time: 1342-1410 PT Time Calculation (min): 28 min   Charges:   PT Evaluation $Initial PT Evaluation Tier I: 1 Procedure PT Treatments $Therapeutic Activity: 23-37 mins   PT G Codes:          Noha Karasik B Seairra Otani 12/15/2013, 2:21 PM Delaney Meigs, PT [phone]

## 2013-12-16 ENCOUNTER — Other Ambulatory Visit: Payer: Self-pay | Admitting: Internal Medicine

## 2013-12-16 ENCOUNTER — Inpatient Hospital Stay (HOSPITAL_COMMUNITY): Payer: PRIVATE HEALTH INSURANCE

## 2013-12-16 DIAGNOSIS — I5043 Acute on chronic combined systolic (congestive) and diastolic (congestive) heart failure: Principal | ICD-10-CM

## 2013-12-16 LAB — MAGNESIUM: MAGNESIUM: 1.7 mg/dL (ref 1.5–2.5)

## 2013-12-16 LAB — BASIC METABOLIC PANEL
BUN: 10 mg/dL (ref 6–23)
CHLORIDE: 99 meq/L (ref 96–112)
CO2: 28 meq/L (ref 19–32)
Calcium: 8.4 mg/dL (ref 8.4–10.5)
Creatinine, Ser: 0.59 mg/dL (ref 0.50–1.10)
GFR calc Af Amer: >90 mL/min (ref >90)
GFR calc non Af Amer: 81 mL/min — ABNORMAL LOW (ref >90)
GLUCOSE: 141 mg/dL — AB (ref 70–99)
Potassium: 3.6 mEq/L — ABNORMAL LOW (ref 3.7–5.3)
SODIUM: 139 meq/L (ref 137–147)

## 2013-12-16 LAB — GLUCOSE, CAPILLARY
GLUCOSE-CAPILLARY: 273 mg/dL — AB (ref 70–99)
Glucose-Capillary: 126 mg/dL — ABNORMAL HIGH (ref 70–99)
Glucose-Capillary: 173 mg/dL — ABNORMAL HIGH (ref 70–99)
Glucose-Capillary: 138 mg/dL — ABNORMAL HIGH (ref 70–99)

## 2013-12-16 MED ORDER — ACETAMINOPHEN 325 MG PO TABS
650.0000 mg | ORAL_TABLET | Freq: Four times a day (QID) | ORAL | Status: DC | PRN
  Administered 2013-12-16: 650 mg via ORAL
  Filled 2013-12-16: qty 2

## 2013-12-16 MED ORDER — COLCHICINE 0.6 MG PO TABS
0.6000 mg | ORAL_TABLET | Freq: Every day | ORAL | Status: DC
  Administered 2013-12-16 – 2013-12-18 (×3): 0.6 mg via ORAL
  Filled 2013-12-16 (×3): qty 1

## 2013-12-16 MED ORDER — POTASSIUM CHLORIDE ER 20 MEQ PO TBCR: 40.0000 meq | EXTENDED_RELEASE_TABLET | Freq: Every day | ORAL | Status: DC

## 2013-12-16 NOTE — Progress Notes (Signed)
HOME HEALTH AGENCIES SERVING [place]   Agencies that are Medicare-Certified and are affiliated with [facility]  Telephone Number Address  [facility]   [facility] has ownership interest in this company; however, you are under no obligation to use this agency. [phone] or  [phone] [address] [place], [place] [address] [email or web address]   Agencies that are Medicare-Certified and are not affiliated with [facility]                                                                                 Home Health Agency Telephone Number Address  [facility] [phone] Fax [phone] [address] [place], [place]  [address] [email or web address]  [facility] [phone] or [phone] Fax [phone] [address] [place], [place] [address] [email or web address]  [facility] [phone] Fax [phone] [address] [place], [place] [address] [email or web address]  [facility] [phone] Fax [phone] [address] [place], [place]  [address] [email or web address]  [facility] [phone] Fax [phone] [address] [place], [place] [address] [email or web address]  [facility] [phone] [address] [place], [place] [address] [email or web address]  [facility] [phone]  [address] [place], [place] [address] [email or web address]  [facility] [phone] or [phone] Fax number [phone] [address] [place], [place]  [address] [email or web address]  [facility] [phone] Fax [phone] [address] [place], [place]  [address]  [facility]  [phone] Fax [phone] [address] [place], [place] [address] [email or web address]       Agencies that are not Medicare-Certified and are not affiliated with [facility]   Home Health Agency Telephone Number Address  [facility] [phone] or [phone] Fax [phone] [address] [place], [place]  [address] [email or web address]  [facility] [phone] Fax [phone] [address] [place], [place]  [address] [email or web address]  [facility] [phone] Fax [phone] [address] [place], [place]  [address] [email or web address]  [facility]  [phone] Fax [phone] [address] [place], [place] [address] [email or web address]  [facility] [phone] Fax [phone] [address] [place], [place] [address]  [facility] [phone] or [phone] Fax [phone] [address] [place], [place]  [address] [email or web address]  [facility] [phone] or [phone] Fax [phone] [address] [place], [place]  [address] [email or web address]  [facility] [phone] Fax [phone] [address] [place], [place]  [address] [email or web address]  [facility] [phone] [address] [place], [place]  [address]  [facility] [phone] Fax [phone] [address] [place], [place]  [address]  [facility] [phone] Fax [phone] [address] [place], [place]  [address] [email or web address]  [facility] [phone] Fax [phone] [address] [place],  [address] [email or web address]  [facility] [phone] Fax [phone] [address] ,     [address] [email or web address]   

## 2013-12-16 NOTE — Progress Notes (Signed)
Clinical Social Work Department BRIEF PSYCHOSOCIAL ASSESSMENT 12/16/2013  Patient:  [name], [name]     Account Number:  [id number]     Admit date:  12/14/2013  Clinical Social Worker:  Harless Nakayama  Date/Time:  12/16/2013 11:00 AM  Referred by:  Physician  Date Referred:  12/16/2013 Referred for  SNF Placement   Other Referral:   Interview type:  Patient Other interview type:    PSYCHOSOCIAL DATA Living Status:  WITH ADULT CHILDREN Admitted from facility:   Level of care:   Primary support name:  Jamauria Chappell [phone] Primary support relationship to patient:  CHILD, ADULT Degree of support available:   Pt has good support    CURRENT CONCERNS Current Concerns  Post-Acute Placement   Other Concerns:    SOCIAL WORK ASSESSMENT / PLAN CSW aware of PT recommendation for SNF. CSW visited pt room and spoke with pt about recommendation. Pt informed CSW she did not want to go to SNF and would want to return home. Pt reported she lives with her daughter [name]. CSW asked if pt was at home alone at all. Pt denied this and stated her granddaughter is with her at all times. CSW did briefly explain HH to pt and pt was agreeable to this if needed. CSW asked if CSW could also call family to verify dc plan. Pt was agreeable to this. CSW tried calling [name] but number on facesheet incorrect. CSW called pt son [name] who is also listed on facesheet. [name] confirmed that pt does live with daughter and has 24 hr supervision. He also agreed that they would like for pt to dc home.   CSW notified RN CM. At this time, no further CSW needs. CSW signing off.   Assessment/plan status:  No Further Intervention Required Other assessment/ plan:   Information/referral to community resources:   SNF list denied    PATIENT'S/FAMILY'S RESPONSE TO PLAN OF CARE: Pt and pt family would like for plan to be dc home.        Ayiana Winslett, LCSWA [phone]

## 2013-12-16 NOTE — Progress Notes (Addendum)
Subjective: [name] was seen and examine this AM.  She denies dyspnea or chest pain.  Her appetite is good.  She notes leg soreness that began last night.  She would like for me to set up transportation for her to get to and from clinic appointments.  Objective: Vital signs in last 24 hours: Filed Vitals:   12/15/13 2041 12/15/13 2059 12/16/13 0500 12/16/13 0625  BP: 143/84   150/77  Pulse: 95   89  Temp: 99.3 F (37.4 C)   99.1 F (37.3 C)  TempSrc: Oral   Oral  Resp: 18   18  Height:      Weight:   69.809 kg (153 lb 14.4 oz)   SpO2: 96% 97%  98%   Weight change: -2.313 kg (-5 lb 1.6 oz)  Intake/Output Summary (Last 24 hours) at 12/16/13 1205 Last data filed at 12/16/13 0853  Gross per 24 hour  Intake    360 ml  Output   1450 ml  Net  -1090 ml   General: sitting up in bed eating breakfast, in NAD HEENT: mucous membranes moist Cardiac: irregularly irregular Pulm: CTA B/L Abd: + BS, soft, NT, ND Ext: warm and well perfused, no edema Neuro: AAO x 3  Lab Results: Basic Metabolic Panel:  Recent Labs Lab 12/15/13 0136 12/15/13 1155 12/16/13 0521  NA 140 141 139  K 3.0* 3.6* 3.6*  CL 100 100 99  CO2 27 29 28   GLUCOSE 216* 161* 141*  BUN 10 10 10   CREATININE 0.57 0.54 0.59  CALCIUM 8.6 8.5 8.4  MG 1.5  --  1.7   CBG:  Recent Labs Lab 12/15/13 0008 12/15/13 0730 12/15/13 1140 12/15/13 1741 12/15/13 2152 12/16/13 0748  GLUCAP 114* 100* 154* 111* 158* 126*   Micro Results: Recent Results (from the past 240 hour(s))  URINE CULTURE     Status: None   Collection Time    12/14/13  7:07 PM      Result Value Ref Range Status   Specimen Description URINE, CLEAN CATCH   Final   Special Requests Normal   Final   Culture  Setup Time     Final   Value: 12/14/2013 20:16     Performed at [facility] Count     Final   Value: >=100,000 COLONIES/ML     Performed at [facility]   Culture     Final   Value: Multiple bacterial  morphotypes present, none predominant. Suggest appropriate recollection if clinically indicated.     Performed at [facility]   Report Status 12/15/2013 FINAL   Final   Studies/Results: Dg Chest 2 View  12/15/2013   CLINICAL DATA:  Shortness of breath.  Cardiomyopathy.  EXAM: CHEST  2 VIEW  COMPARISON:  Chest x-rays dated 12/14/2013 and 08/18/2013  FINDINGS: There is prominent chronic cardiomegaly with tortuosity and calcification of the thoracic aorta. Main pulmonary arteries are quite prominent on a chronic basis. There is no pulmonary vascular congestion. There are no acute infiltrates or effusions. There is a chronic area of scarring or bronchial impaction at the left lung base, unchanged since 08/18/2013.  IMPRESSION: No acute abnormality.  Chronic cardiomegaly.   Electronically Signed   By: Geanie Cooley M.D.   On: 12/15/2013 16:45   Dg Chest Port 1 View  12/14/2013   SHORTNESS OF CLINICAL DATA: Shortness of breath, chest pain, atrial fibrillation ; history of pulmonary embolism in January of 2015.  EXAM: PORTABLE CHEST - 1 VIEW  COMPARISON:  CT ANGIO CHEST W/CM &/OR WO/CM dated 08/18/2013  FINDINGS: The lungs are well-expanded. There are coarse lung markings in the retrocardiac region on the left. This area has improved slightly since the previous study. The cardiopericardial silhouette remains enlarged. The pulmonary vascularity is not engorged. There is stable deviation of the trachea towards the right. The observed portions of the bony thorax exhibit no acute abnormalities.  IMPRESSION: There is mild stable enlargement cardiac silhouette without overt evidence of pulmonary edema. This likely reflects low-grade compensated CHF. There is density in the retrocardiac region which is not entirely new and likely reflects atelectasis or infiltrate. Contrast-enhanced chest CT scanning may be of value if the patient is having significant cardiopulmonary symptoms.   Electronically Signed   By: David   [place]   On: 12/14/2013 19:23   Medications: I have reviewed the patient's current medications. Scheduled Meds: . brimonidine  1 drop Left Eye BID  . dorzolamide  1 drop Left Eye BID  . furosemide  40 mg Intravenous BID  . insulin aspart  0-9 Units Subcutaneous TID WC  . lisinopril  20 mg Oral Daily  . metoprolol succinate  50 mg Oral Daily  . mometasone-formoterol  2 puff Inhalation BID  . potassium chloride  40 mEq Oral BID  . rivaroxaban  20 mg Oral Q supper  . sodium chloride  3 mL Intravenous Q12H   Continuous Infusions: none PRN Meds:.sodium chloride, acetaminophen, albuterol, sodium chloride  Assessment/Plan:  80 yo woman with PMH of systolic and diastolic HF (EF 16-10%40-45% Jan 2015), Afib and recent PE (on Xarelto), DM type 2, HTN, Grave's disease who presents with dyspnea.   1. Acute on chronic combined systolic and diastolic congestive heart failure. Net neg 2.2 L and wt down 6lbs since admission.  Improved dyspnea.  - d/c on home Lasix dose - 40mg  po BID - continue lisinopril, Toprol - continue KDur at d/c - check SpO2 with ambulation  2. DM type 2 - home medication is metformin 1000mg  BID - SSI and CBG monitoring while inpatient - resume metformin at d/c  3. HTN - continue Lisinopril, Lasix, Toprol XL  4. Atrial fibrillation - rate controlled - continue Toprolol XL 50mg  for rate control - continue Xarelto for anticoagulation  5. Hypokalemia - possible reason for leg discomfort.  No evidence of swelling and leg non-tender, also patient is on Xarelto making DVT less likely. - continue Kdur  VTE ppx: Xarelto  Addendum: The nurse reports that [name] will not bear weight on her left leg 2/2 to pain.  I went to re-evaluate her.  The patient's main complaint is "soreness" of the leg and she points to the shin.  She says it has been going on since the morning of admission.  She denies recent trauma to the leg.  Clinically does not look like DVT -  no tenderness to  palpation, no lower extremity edema or erythema, negative Homan's sign and she is on Xarelto.  I will order a tib/fib xray of her left leg to evaluate for injury.  If negative, she can likely be d/c with close follow-up and home health PT.  Dispo: The patient is hemodynamically stable and dyspnea has improved during admission.  PT recommends SNF, however she has 24 hour supervision at home and both patient and family would like d/c to home as oppose to SNF.  [facility]  The patient has a  cardiology appointment on 12/19/13.  The patient does have a current PCP Evelena Peat, DO) and does need an [facility] hospital follow-up appointment after discharge.  The patient does not have transportation limitations that hinder transportation to clinic appointments.  .Services Needed at time of discharge: Y = Yes, Blank = No PT:  Y  OT:   RN:   Equipment:   Other:  transportation assistance     LOS: 2 days   Evelena Peat, DO 12/16/2013, 12:05 PM

## 2013-12-16 NOTE — Care Management Note (Signed)
    Page 1 of 2   12/16/2013     3:22:54 PM CARE MANAGEMENT NOTE 12/16/2013  Patient:  [name], [name]   Account Number:  [id number]  Date Initiated:  12/16/2013  Documentation initiated by:  Donn Pierini  Subjective/Objective Assessment:   Pt admitted with weakness/ CHF     Action/Plan:   PTA pt lived at home with daughter- PT eval recommending SNF- family wants to take pt home per CSW   Anticipated DC Date:  12/17/2013   Anticipated DC Plan:  HOME W HOME HEALTH SERVICES  In-house referral  Clinical Social Worker      DC Associate Professor  CM consult      [facility] Choice  HOME HEALTH   Choice offered to / List presented to:  C-1 Patient        HH arranged  HH-1 RN  HH-10 DISEASE MANAGEMENT  HH-2 PT      HH agency  [facility]   Status of service:  Completed, signed off Medicare Important Message given?  YES (If response is "NO", the following Medicare IM given date fields will be blank) Date Medicare IM given:  12/16/2013 Date Additional Medicare IM given:    Discharge Disposition:  HOME W HOME HEALTH SERVICES  Per UR Regulation:  Reviewed for med. necessity/level of care/duration of stay  If discussed at Long Length of Stay Meetings, dates discussed:    Comments:  12/16/13- 1500- Donn Pierini RN, BSN [phone] Referral received for [facility] services- orders in for HH-RN and PT- pt will need CHF management- does not want to go to SNF and family agreeable to pt returning home- Spoke with pt at bedside- who states she has had HH in past and is agreeable to services again- list of [facility] agencies shown to pt for [facility] and pt reports that she has used [facility] in past and would like to use them again- referral called to [place] with [facility] for HH-RN and PT - services to start within 24-48 hr post discharge

## 2013-12-16 NOTE — Consult Note (Signed)
Heart Failure Navigator Consult Note  Presentation: [name] is a 80 yr old woman with hx HFrEF (EF 40%), Afib on xarelto, Type 2 DM, Grave's disease, presented with weakness and SOB. She states that her SOB is worse with exertion. She was recently admitted for submassive PE with evidence of right heart strain. She states she has been taking her lasix as prescribed. She also admits to taking her KCl as prescribed. She denies chest pain.  She received Lasix 40 mg IV with improvement. She feels better this morning, but states her hands seem weaker and with a little tremor.   Past Medical History  Diagnosis Date  . Hypertension   . Diabetes mellitus   . Asthma   . Osteoarthritis     left shoulder  . Pseudogout   . Hypokalemia     a. Previously felt due to diuretics, req supplementation.  . Churg-Strauss syndrome     a. Sural nerve biopsy 02/2000.  [name] disease     a. s/p thyroidectomy.  . Glaucoma   . Hypercholesteremia   . Venous insufficiency   . Osteopenia     DEXA 2004  . Lipoma     left inner thigh  . Cataract     left eye  . VASCULITIS   . Chronic combined systolic and diastolic heart failure     a. 10/9765 EF:20-25%  . Pleural effusion, left     a. Thoracentesis 2001 - per notes, no malignant cells, was transudative.  Marland Kitchen NSVT (nonsustained ventricular tachycardia)     a. During CHF adm 2001.  . Multifocal atrial tachycardia     a. Documented as OP 01/2012.  Marland Kitchen Ectopic atrial tachycardia     a. Documented on tele as IP 02/2012.  . Pulmonary HTN     a. Mod by echo 02/2012.  . Valvular heart disease     a. Echo 02/2012: mod MR/TR, mild AI.  Marland Kitchen Pericardial effusion     a. Echo 02/2012: small-mod pericardial effusion.  . Atrial fibrillation, permanent     a. Dx 04/2013, on metoprolol, dig and xarelto    History   Social History  . Marital Status: Widowed    Spouse Name: N/A    Number of Children: N/A  . Years of Education: N/A   Occupational History  . Retired     Social History Main Topics  . Smoking status: Never Smoker   . Smokeless tobacco: Never Used  . Alcohol Use: No  . Drug Use: No  . Sexual Activity: None   Other Topics Concern  . None   Social History Narrative   Lives in house, with family just next door.    Never smoker, no alcohol.      Used to work at [employer].    Before that, worked in food prep.                    ECHO:Study Conclusions--08/19/13  - Left ventricle: Wall thickness was increased in a pattern of moderate LVH. Systolic function was mildly to moderately reduced. The estimated ejection fraction was in the range of 40% to 45%. Wall motion was normal; there were no regional wall motion abnormalities. Doppler parameters are consistent with arestrictive pattern, indicative of decreased left ventricular diastolic compliance and/or increased left atrial pressure (grade 3 diastolic dysfunction). Doppler parameters are consistent with elevated ventricular end-diastolic filling pressure. - Left atrium: The atrium was moderately dilated. - Right ventricle: The cavity size  was mildly dilated. Systolic function was normal. - Right atrium: The atrium was moderately dilated. - Atrial septum: There was a secundum atrial septal defect. Size and Qp/Qs cannot be assessed on current study. A TEE is recommended. - Tricuspid valve: Moderate-severe regurgitation. - Pulmonary arteries: Systolic pressure was severely increased. PA peak pressure: 60mm Hg (S   BNP    Component Value Date/Time   PROBNP 4874.0* 12/14/2013 1846    Education Assessment and Provision:  Detailed education and instructions provided on heart failure disease management including the following:  Signs and symptoms of Heart Failure When to call the physician Importance of daily weights Low sodium diet  Fluid restriction Medication management Anticipated future follow-up appointments  Patient education given on each of  the above topics.  Patient acknowledges understanding and acceptance of all instructions.  [name] lives with her daughter and granddaughter.  She says that she has 24 hour care and yet says that she needs transportation to appts as "can't depend on my son".  She has what seems limited insight into her HF and medical conditions.  I reviewed HF educational topics with her.  She says that she eats low sodium.  She does admit that she does not weigh daily.  I reinforced the importance of daily weights and when to call the doctor.     Education Materials:  "Living Better With Heart Failure" Booklet, Daily Weight Tracker Tool and Heart Failure Educational Video.   High Risk Criteria for Readmission and/or Poor Patient Outcomes:    EF <30%- No-40-45%  2 or more admissions in 6 months- Yes  Difficult social situation- Yes--would be appropriate for SNF as I worry she spends time alone- however refused  Demonstrates medication noncompliance- No    Barriers of Care:  Home situation-- Knowledge of medical condition and ability to be compliant  Discharge Planning:   Plans to discharge to home with daughter/granddaughter--refused SNF.  [facility] for additional resources.  [facility] for additional education as well as compliance reinforcement with family members.

## 2013-12-16 NOTE — Progress Notes (Signed)
Pt seen and examined with dr. Andrey Campanile. I have reviewed Dr. Tawana Scale note and agree with documentation as outlined.  Pt with acute on chronic combined systolic/diastolic CHF. Currently feels better. Pt for d/c home on lasix 40 mg po bid(home dose).  Pt c/o pain in her left anterior shin and c/o pain with weight bearing. Will get x ray Left ankle and tibia/fibula. Pt on xarelto and has no signs of DVT. Will hold off on U/S with dopplers for now. PT to follow up today.  DM currently well controlled. Will monitor CBG. Resume metformin on d/c  Case d/w Dr. Andrey Campanile and patient in detail

## 2013-12-16 NOTE — Progress Notes (Signed)
Received referral from Heart Failure Clinic RN Navigator. Cross checked and patient is already active with [facility] Care Management services. [facility] Care Coordinator has been trying to work with patient at home for CHF management. She continues to eat foods that are higher in sodium. For example, canned soups. Spoke with patient at bedside to also emphasize SNF being a better option for her. Asked her if she realized that it was only short-term. She stated she did not care. Remained adamant about going home and not SNF. [facility] Care Management will follow up with patient post hospital discharge. Made inpatient RNCM aware that [facility] following. Also noted patient will have home health services arranged as well.  Raiford Noble, MSN- RN,BSN- [facility] [phone]

## 2013-12-17 LAB — CBC
HCT: 38.2 % (ref 36.0–46.0)
Hemoglobin: 12.5 g/dL (ref 12.0–15.0)
MCH: 31.3 pg (ref 26.0–34.0)
MCHC: 32.7 g/dL (ref 30.0–36.0)
MCV: 95.7 fL (ref 78.0–100.0)
Platelets: 244 10*3/uL (ref 150–400)
RBC: 3.99 MIL/uL (ref 3.87–5.11)
RDW: 13.8 % (ref 11.5–15.5)
WBC: 5.4 10*3/uL (ref 4.0–10.5)

## 2013-12-17 LAB — BASIC METABOLIC PANEL
BUN: 14 mg/dL (ref 6–23)
CALCIUM: 8.7 mg/dL (ref 8.4–10.5)
CO2: 28 mEq/L (ref 19–32)
Chloride: 101 mEq/L (ref 96–112)
Creatinine, Ser: 0.58 mg/dL (ref 0.50–1.10)
GFR calc Af Amer: >90 mL/min (ref >90)
GFR calc non Af Amer: 81 mL/min — ABNORMAL LOW (ref >90)
GLUCOSE: 134 mg/dL — AB (ref 70–99)
Potassium: 3.8 mEq/L (ref 3.7–5.3)
SODIUM: 139 meq/L (ref 137–147)

## 2013-12-17 LAB — GLUCOSE, CAPILLARY
Glucose-Capillary: 150 mg/dL — ABNORMAL HIGH (ref 70–99)
Glucose-Capillary: 212 mg/dL — ABNORMAL HIGH (ref 70–99)
Glucose-Capillary: 291 mg/dL — ABNORMAL HIGH (ref 70–99)

## 2013-12-17 MED ORDER — FUROSEMIDE 40 MG PO TABS
40.0000 mg | ORAL_TABLET | Freq: Two times a day (BID) | ORAL | Status: DC
  Administered 2013-12-17 – 2013-12-18 (×3): 40 mg via ORAL
  Filled 2013-12-17 (×5): qty 1

## 2013-12-17 NOTE — Consult Note (Signed)
Reason for Consult:  Left knee pain and effusion Referring Physician:   Internal Medicine teaching Service  Dalaney Needle is an 80 y.o. female.  HPI:   80 yo female currently in the hospital with cardiac issues.  Has been complaining of left knee pain and has had difficulty with weight-bearing.  Ortho is called to assess the patient due to a left knee effusion.  Past Medical History  Diagnosis Date  . Hypertension   . Diabetes mellitus   . Asthma   . Osteoarthritis     left shoulder  . Pseudogout   . Hypokalemia     a. Previously felt due to diuretics, req supplementation.  . Churg-Strauss syndrome     a. Sural nerve biopsy 02/2000.  Jackalyn Lombard disease     a. s/p thyroidectomy.  . Glaucoma   . Hypercholesteremia   . Venous insufficiency   . Osteopenia     DEXA 2004  . Lipoma     left inner thigh  . Cataract     left eye  . VASCULITIS   . Chronic combined systolic and diastolic heart failure     a. 02/2012 EF:20-25%  . Pleural effusion, left     a. Thoracentesis 2001 - per notes, no malignant cells, was transudative.  Marland Kitchen NSVT (nonsustained ventricular tachycardia)     a. During CHF adm 2001.  . Multifocal atrial tachycardia     a. Documented as OP 01/2012.  Marland Kitchen Ectopic atrial tachycardia     a. Documented on tele as IP 02/2012.  . Pulmonary HTN     a. Mod by echo 02/2012.  . Valvular heart disease     a. Echo 02/2012: mod MR/TR, mild AI.  Marland Kitchen Pericardial effusion     a. Echo 02/2012: small-mod pericardial effusion.  . Atrial fibrillation, permanent     a. Dx 04/2013, on metoprolol, dig and xarelto    Past Surgical History  Procedure Laterality Date  . Abdominal hysterectomy    . Salpingoophorectomy    . Thyroidectomy    . Hemiarthroplasty hip Right 10/2010    cemented for hip fracture, femoral neck - by Johnny Bridge, MD  . Cataract extraction Right   . Hemiarthroplasty hip      Family History  Problem Relation Age of Onset  . Diabetes Mother   . Diabetes Father   .  Heart disease Neg Hx     Social History:  reports that she has never smoked. She has never used smokeless tobacco. She reports that she does not drink alcohol or use illicit drugs.  Allergies: No Known Allergies  Medications: I have reviewed the patient's current medications.  Results for orders placed during the hospital encounter of 12/14/13 (from the past 48 hour(s))  GLUCOSE, CAPILLARY     Status: Abnormal   Collection Time    12/15/13 11:40 AM      Result Value Ref Range   Glucose-Capillary 154 (*) 70 - 99 mg/dL  BASIC METABOLIC PANEL     Status: Abnormal   Collection Time    12/15/13 11:55 AM      Result Value Ref Range   Sodium 141  137 - 147 mEq/L   Potassium 3.6 (*) 3.7 - 5.3 mEq/L   Chloride 100  96 - 112 mEq/L   CO2 29  19 - 32 mEq/L   Glucose, Bld 161 (*) 70 - 99 mg/dL   BUN 10  6 - 23 mg/dL   Creatinine, Ser 0.54  0.50 - 1.10 mg/dL   Calcium 8.5  8.4 - 10.5 mg/dL   GFR calc non Af Amer 83 (*) >90 mL/min   GFR calc Af Amer >90  >90 mL/min   Comment: (NOTE)     The eGFR has been calculated using the CKD EPI equation.     This calculation has not been validated in all clinical situations.     eGFR's persistently <90 mL/min signify possible Chronic Kidney     Disease.  GLUCOSE, CAPILLARY     Status: Abnormal   Collection Time    12/15/13  5:41 PM      Result Value Ref Range   Glucose-Capillary 111 (*) 70 - 99 mg/dL  GLUCOSE, CAPILLARY     Status: Abnormal   Collection Time    12/15/13  9:52 PM      Result Value Ref Range   Glucose-Capillary 158 (*) 70 - 99 mg/dL  BASIC METABOLIC PANEL     Status: Abnormal   Collection Time    12/16/13  5:21 AM      Result Value Ref Range   Sodium 139  137 - 147 mEq/L   Potassium 3.6 (*) 3.7 - 5.3 mEq/L   Chloride 99  96 - 112 mEq/L   CO2 28  19 - 32 mEq/L   Glucose, Bld 141 (*) 70 - 99 mg/dL   BUN 10  6 - 23 mg/dL   Creatinine, Ser 0.59  0.50 - 1.10 mg/dL   Calcium 8.4  8.4 - 10.5 mg/dL   GFR calc non Af Amer 81 (*)  >90 mL/min   GFR calc Af Amer >90  >90 mL/min   Comment: (NOTE)     The eGFR has been calculated using the CKD EPI equation.     This calculation has not been validated in all clinical situations.     eGFR's persistently <90 mL/min signify possible Chronic Kidney     Disease.  MAGNESIUM     Status: None   Collection Time    12/16/13  5:21 AM      Result Value Ref Range   Magnesium 1.7  1.5 - 2.5 mg/dL  GLUCOSE, CAPILLARY     Status: Abnormal   Collection Time    12/16/13  7:48 AM      Result Value Ref Range   Glucose-Capillary 126 (*) 70 - 99 mg/dL  GLUCOSE, CAPILLARY     Status: Abnormal   Collection Time    12/16/13 11:46 AM      Result Value Ref Range   Glucose-Capillary 173 (*) 70 - 99 mg/dL  GLUCOSE, CAPILLARY     Status: Abnormal   Collection Time    12/16/13  4:25 PM      Result Value Ref Range   Glucose-Capillary 138 (*) 70 - 99 mg/dL  GLUCOSE, CAPILLARY     Status: Abnormal   Collection Time    12/16/13 10:02 PM      Result Value Ref Range   Glucose-Capillary 273 (*) 70 - 99 mg/dL  BASIC METABOLIC PANEL     Status: Abnormal   Collection Time    12/17/13  6:53 AM      Result Value Ref Range   Sodium 139  137 - 147 mEq/L   Potassium 3.8  3.7 - 5.3 mEq/L   Chloride 101  96 - 112 mEq/L   CO2 28  19 - 32 mEq/L   Glucose, Bld 134 (*) 70 - 99 mg/dL   BUN  14  6 - 23 mg/dL   Creatinine, Ser 0.58  0.50 - 1.10 mg/dL   Calcium 8.7  8.4 - 10.5 mg/dL   GFR calc non Af Amer 81 (*) >90 mL/min   GFR calc Af Amer >90  >90 mL/min   Comment: (NOTE)     The eGFR has been calculated using the CKD EPI equation.     This calculation has not been validated in all clinical situations.     eGFR's persistently <90 mL/min signify possible Chronic Kidney     Disease.  CBC     Status: None   Collection Time    12/17/13  6:53 AM      Result Value Ref Range   WBC 5.4  4.0 - 10.5 K/uL   RBC 3.99  3.87 - 5.11 MIL/uL   Hemoglobin 12.5  12.0 - 15.0 g/dL   HCT 38.2  36.0 - 46.0 %   MCV  95.7  78.0 - 100.0 fL   MCH 31.3  26.0 - 34.0 pg   MCHC 32.7  30.0 - 36.0 g/dL   RDW 13.8  11.5 - 15.5 %   Platelets 244  150 - 400 K/uL  GLUCOSE, CAPILLARY     Status: Abnormal   Collection Time    12/17/13  7:39 AM      Result Value Ref Range   Glucose-Capillary 150 (*) 70 - 99 mg/dL    Dg Chest 2 View  12/15/2013   CLINICAL DATA:  Shortness of breath.  Cardiomyopathy.  EXAM: CHEST  2 VIEW  COMPARISON:  Chest x-rays dated 12/14/2013 and 08/18/2013  FINDINGS: There is prominent chronic cardiomegaly with tortuosity and calcification of the thoracic aorta. Main pulmonary arteries are quite prominent on a chronic basis. There is no pulmonary vascular congestion. There are no acute infiltrates or effusions. There is a chronic area of scarring or bronchial impaction at the left lung base, unchanged since 08/18/2013.  IMPRESSION: No acute abnormality.  Chronic cardiomegaly.   Electronically Signed   By: Rozetta Nunnery M.D.   On: 12/15/2013 16:45   Dg Tibia/fibula Left Port  12/16/2013   CLINICAL DATA:  80 year old left lower leg pain.  No known injury.  EXAM: PORTABLE LEFT TIBIA AND FIBULA - 2 VIEW  COMPARISON:  01/26/2004 left knee MR  FINDINGS: There is no evidence of fracture, subluxation or dislocation.  No focal bony lesions are present.  Vascular calcifications are noted.  Severe degenerative changes in the left knee again noted with a large knee effusion.  IMPRESSION: Severe degenerative changes in the left knee with large knee effusion.  No evidence of acute bony abnormality.   Electronically Signed   By: Hassan Rowan M.D.   On: 12/16/2013 16:01    ROS Blood pressure 133/72, pulse 79, temperature 98.6 F (37 C), temperature source Oral, resp. rate 18, height _0  (1.626 m), weight 69.037 kg (152 lb 3.2 oz), SpO2 97.00%. Physical Exam  Musculoskeletal:       Right knee: She exhibits decreased range of motion, effusion, abnormal alignment and bony tenderness. Tenderness found. Medial joint line  and lateral joint line tenderness noted.       Left knee: She exhibits effusion, abnormal alignment and bony tenderness. Tenderness found. Medial joint line and lateral joint line tenderness noted.   Both knees have significant varus deformities with obvious profound arthritis charges.  Her left knee films shows end-stage arthritis in her left knee with significant loss of the joint space, varus  alignment, significant osteophytes and sclerotic changes  There is no evidence of infection.  Assessment/Plan: Severe arthritis bilateral knees with a painful effusion of her left knee. 1)  I do feel that most of her pain is related to her severe arthritis.  There also may be a component of gout.  I do not feel that this is an infection at all.  I cleaned her knee with betadine and an alcohol swab then easily aspirated about 40 cc of fluid from her knee that is consistent with OA.  It was mainly serous, but likely a component of gout is well, because I did see sediment in the aspiration.  She tolerated the aspiration well.  She could benefit from a steroid injection in that knee at some point, but no further recs for now.  Mcarthur Rossetti 12/17/2013, 10:57 AM

## 2013-12-17 NOTE — Progress Notes (Signed)
Patient ID: [name], female   DOB: 1926/02/15, 80 y.o.   MRN: [id number] After I aspirated her left knee and got fluid consistent with arthritis vs gout, I came back by and placed a steroid injection into her left knee that she tolerated well ( 1% plain lidocaine mixed with depomedrol).  No further recs.  Increase activities as tolerated.

## 2013-12-17 NOTE — Progress Notes (Signed)
Subjective: [name]. Pt still complaining of some left knee pain that is limiting movement. Still requiring 2 person assistance which is acute change from patient baseline. Pt further states that she has had a hx of gout but not in her knee. There are no other joints that are bothering her or swollen. Her breathing has much improved and she denied any CP, SOB, HA, or dizziness.   Objective: Vital signs in last 24 hours: Filed Vitals:   12/16/13 1317 12/16/13 1936 12/16/13 2204 12/17/13 0517  BP: 165/90  123/54 133/72  Pulse: 109  104 79  Temp: 98.1 F (36.7 C)  99.3 F (37.4 C) 98.6 F (37 C)  TempSrc: Oral  Oral Oral  Resp: 18  16 18   Height:      Weight:    152 lb 3.2 oz (69.037 kg)  SpO2: 97% 95% 96% 97%   Weight change: -1 lb 11.2 oz (-0.771 kg)  Intake/Output Summary (Last 24 hours) at 12/17/13 0953 Last data filed at 12/17/13 0900  Gross per 24 hour  Intake   1080 ml  Output    300 ml  Net    780 ml   General: sitting up in bed eating breakfast, in NAD HEENT: mucous membranes moist Cardiac: irregularly irregular Pulm: CTA B/L Abd: + BS, soft, NT, ND Ext: warm and well perfused, no pedal edema, large palpable left knee effusion, warm to touch over left knee, some limited left knee ROM limited by pain, unable to fully bear weight on left knee, no other joints warm or tender or erythematous, 5/5 LE strength  Neuro: AAO x 3  Lab Results: Basic Metabolic Panel:  Recent Labs Lab 12/15/13 0136  12/16/13 0521 12/17/13 0653  NA 140  < > 139 139  K 3.0*  < > 3.6* 3.8  CL 100  < > 99 101  CO2 27  < > 28 28  GLUCOSE 216*  < > 141* 134*  BUN 10  < > 10 14  CREATININE 0.57  < > 0.59 0.58  CALCIUM 8.6  < > 8.4 8.7  MG 1.5  --  1.7  --   < > = values in this interval not displayed. CBG:  Recent Labs Lab 12/15/13 2152 12/16/13 0748 12/16/13 1146 12/16/13 1625 12/16/13 2202 12/17/13 0739  GLUCAP 158* 126* 173* 138* 273* 150*   Micro Results: Recent Results  (from the past 240 hour(s))  URINE CULTURE     Status: None   Collection Time    12/14/13  7:07 PM      Result Value Ref Range Status   Specimen Description URINE, CLEAN CATCH   Final   Special Requests Normal   Final   Culture  Setup Time     Final   Value: 12/14/2013 20:16     Performed at [facility] Count     Final   Value: >=100,000 COLONIES/ML     Performed at [facility]   Culture     Final   Value: Multiple bacterial morphotypes present, none predominant. Suggest appropriate recollection if clinically indicated.     Performed at [facility]   Report Status 12/15/2013 FINAL   Final   Studies/Results: Dg Chest 2 View  12/15/2013   CLINICAL DATA:  Shortness of breath.  Cardiomyopathy.  EXAM: CHEST  2 VIEW  COMPARISON:  Chest x-rays dated 12/14/2013 and 08/18/2013  FINDINGS: There is prominent chronic cardiomegaly with tortuosity and calcification of  the thoracic aorta. Main pulmonary arteries are quite prominent on a chronic basis. There is no pulmonary vascular congestion. There are no acute infiltrates or effusions. There is a chronic area of scarring or bronchial impaction at the left lung base, unchanged since 08/18/2013.  IMPRESSION: No acute abnormality.  Chronic cardiomegaly.   Electronically Signed   By: Geanie Cooley M.D.   On: 12/15/2013 16:45   Dg Tibia/fibula Left Port  12/16/2013   CLINICAL DATA:  80 year old left lower leg pain.  No known injury.  EXAM: PORTABLE LEFT TIBIA AND FIBULA - 2 VIEW  COMPARISON:  01/26/2004 left knee MR  FINDINGS: There is no evidence of fracture, subluxation or dislocation.  No focal bony lesions are present.  Vascular calcifications are noted.  Severe degenerative changes in the left knee again noted with a large knee effusion.  IMPRESSION: Severe degenerative changes in the left knee with large knee effusion.  No evidence of acute bony abnormality.   Electronically Signed   By: Laveda Abbe M.D.   On: 12/16/2013  16:01   Medications: I have reviewed the patient's current medications. Scheduled Meds: . brimonidine  1 drop Left Eye BID  . colchicine  0.6 mg Oral Daily  . dorzolamide  1 drop Left Eye BID  . furosemide  40 mg Oral BID  . insulin aspart  0-9 Units Subcutaneous TID WC  . lisinopril  20 mg Oral Daily  . metoprolol succinate  50 mg Oral Daily  . mometasone-formoterol  2 puff Inhalation BID  . potassium chloride  40 mEq Oral BID  . rivaroxaban  20 mg Oral Q supper  . sodium chloride  3 mL Intravenous Q12H   Continuous Infusions: none PRN Meds:.sodium chloride, acetaminophen, albuterol, sodium chloride  Assessment/Plan:  79 yo woman with PMH of systolic and diastolic HF (EF 24-46% Jan 2015), Afib and recent PE (on Xarelto), DM type 2, HTN, Grave's disease who presents with dyspnea.   # Acute left Knee pain with suspicion of gout vs septic joint : Pt reports recent worsening pain in left knee that limits her mobility profoundly 2/2 pain. She has had a hx of gout (not previously confirmed by aspiration) and that her knee is not usually the joint affected. Tib/fib of her left leg revealed left knee DJD and a large effusion that correlates clinically with physical exam findings of the left knee. The patient has no other signs of infection to suggest a septic joint but joint aspiration will be performed to rule out given isolated effusion and joint pain that was acute onset.  -ortho consulted for knee aspiration- greatly appreciated -cont colchicine -once synovial fluid analyzed will pursue appropriate treatment  # Acute on chronic combined systolic and diastolic congestive heart failure-resolved. Net neg 2.2 L and wt down 7lbs since admission.  Improved dyspnea.  - cont home dose lasix 40mg  po BID - continue lisinopril, Toprol - continue KDur at d/c at increase of daily   # DM type 2 - home medication is metformin 1000mg  BID - SSI and CBG monitoring while inpatient - resume  metformin at d/c  # HTN - continue Lisinopril, Lasix, Toprol XL  # Atrial fibrillation - rate controlled - continue Toprolol XL 50mg  for rate control - continue Xarelto for anticoagulation  VTE ppx: Xarelto  Dispo: The patient is hemodynamically stable and dyspnea has improved during admission.  PT recommends SNF, however she has 24 hour supervision at home and both patient and family would like d/c  to home as oppose to SNF.  [facility]  The patient has a cardiology appointment on 12/19/13.  The patient does have a current PCP Evelena Peat(Alex Wilson, DO) and [facility].  The patient does not have transportation limitations that hinder transportation to clinic appointments.  .Services Needed at time of discharge: Y = Yes, Blank = No PT:  Y  OT:   RN:   Equipment:   Other:  transportation assistance     LOS: 3 days   Christen BameNora Laynee Lockamy, MD 12/17/2013, 9:53 AM

## 2013-12-18 LAB — BASIC METABOLIC PANEL
BUN: 22 mg/dL (ref 6–23)
BUN: 23 mg/dL (ref 6–23)
CALCIUM: 9 mg/dL (ref 8.4–10.5)
CHLORIDE: 98 meq/L (ref 96–112)
CO2: 26 mEq/L (ref 19–32)
CO2: 25 meq/L (ref 19–32)
CREATININE: 0.65 mg/dL (ref 0.50–1.10)
Calcium: 9.2 mg/dL (ref 8.4–10.5)
Chloride: 99 mEq/L (ref 96–112)
Creatinine, Ser: 0.64 mg/dL (ref 0.50–1.10)
GFR calc Af Amer: >90 mL/min (ref >90)
GFR calc Af Amer: >90 mL/min (ref >90)
GFR calc non Af Amer: 78 mL/min — ABNORMAL LOW (ref >90)
GFR, EST NON AFRICAN AMERICAN: 79 mL/min — AB (ref >90)
GLUCOSE: 237 mg/dL — AB (ref 70–99)
Glucose, Bld: 332 mg/dL — ABNORMAL HIGH (ref 70–99)
Potassium: 5 mEq/L (ref 3.7–5.3)
Potassium: 4.5 mEq/L (ref 3.7–5.3)
Sodium: 135 mEq/L — ABNORMAL LOW (ref 137–147)
Sodium: 136 mEq/L — ABNORMAL LOW (ref 137–147)

## 2013-12-18 NOTE — Discharge Instructions (Signed)
1. You have hospital follow up appointments as follows: You will be contacted by the clinic with your appointment date and time.  You have a Cardiology appointment on 12/19/2013 at 8:45AM.  2. Please take all medications as prescribed.    3. If you have worsening of your symptoms or new symptoms arise, please call the clinic ([phone]), or go to the ER immediately if symptoms are severe.

## 2013-12-18 NOTE — Clinical Social Work Note (Signed)
CSW made aware by [facility] of assistance with transportation for patient to cardiologist appointment on 12/19/13. CSW met with patient who was on phone with daughter Cory Munch). CSW introduced self and explained role. CSW discussed transportation assistance with patient. Patient stated she did not understand why CSW was asking her about transportation anywhere. Patient then handed CSW phone in order to speak with daughter. Daughter informed CSW she was patient transportation and would pick up patient from hospital. Patient's daughter further stated she would transport patient wherever she needed to go.   4:15pm. CSW spoke with patient's daughter who reported speaking with MD. Patient's daughter stated MD has arranged for transport of patient to appointment on Monday, 12/19/13. Patient's daughter stated she is to receive a call on this date to confirm transportation plan. Patient's daughter further reported patient is to have transportation to and from appointments arranged. CSW verbalized understanding. No further needs. CSW signing off.  [place], [place] Weekend Clinical Social Worker [phone]

## 2013-12-18 NOTE — Progress Notes (Signed)
Subjective: [name] was seen and examined this AM.  She is feeling well and ready to go home.  She denies chest pain, dyspnea or knee pain.  Her leg soreness has improved and range of motion have improved.  She is urinating a lot (on Lasix) but denies dysuria.   Objective: Vital signs in last 24 hours: Filed Vitals:   12/17/13 1100 12/17/13 1359 12/17/13 2055 12/18/13 0450  BP: 131/71 118/76 138/86 129/66  Pulse: 68 97 85 88  Temp:  98.7 F (37.1 C) 97.8 F (36.6 C) 97.8 F (36.6 C)  TempSrc:  Oral Oral Oral  Resp:  18 18 18   Height:      Weight:    69.128 kg (152 lb 6.4 oz)  SpO2:  91% 97% 98%   Weight change: 0.091 kg (3.2 oz)  Intake/Output Summary (Last 24 hours) at 12/18/13 0801 Last data filed at 12/18/13 [id number]  Gross per 24 hour  Intake    720 ml  Output    825 ml  Net   -105 ml   General: resting in bed in NAD HEENT: mucous membranes moist Cardiac: irregularly irregular Pulm: CTA B/L; breathing comfortably on room air Abd: + BS, soft, NT, ND Ext: warm and well perfused, no pedal edema; left knee nontender and w/o palpable effusion, increased warmth or redness; she is easily able to flex the knee  Neuro: AAO x 3, able to move all extremities voluntarily  Lab Results: Basic Metabolic Panel:  Recent Labs Lab 12/15/13 0136  12/16/13 0521 12/17/13 0653 12/18/13 0341  NA 140  < > 139 139 135*  K 3.0*  < > 3.6* 3.8 5.0  CL 100  < > 99 101 99  CO2 27  < > 28 28 26   GLUCOSE 216*  < > 141* 134* 237*  BUN 10  < > 10 14 22   CREATININE 0.57  < > 0.59 0.58 0.64  CALCIUM 8.6  < > 8.4 8.7 9.0  MG 1.5  --  1.7  --   --   < > = values in this interval not displayed. CBG:  Recent Labs Lab 12/16/13 1146 12/16/13 1625 12/16/13 2202 12/17/13 0739 12/17/13 1139 12/17/13 1705  GLUCAP 173* 138* 273* 150* 212* 291*   Medications: I have reviewed the patient's current medications. Scheduled Meds: . brimonidine  1 drop Left Eye BID  . colchicine  0.6 mg Oral  Daily  . dorzolamide  1 drop Left Eye BID  . furosemide  40 mg Oral BID  . insulin aspart  0-9 Units Subcutaneous TID WC  . lisinopril  20 mg Oral Daily  . metoprolol succinate  50 mg Oral Daily  . mometasone-formoterol  2 puff Inhalation BID  . rivaroxaban  20 mg Oral Q supper  . sodium chloride  3 mL Intravenous Q12H   Continuous Infusions: none PRN Meds:.sodium chloride, acetaminophen, albuterol, sodium chloride  Assessment/Plan:  80 yo woman with PMH of systolic and diastolic HF (EF 96-04%40-45% Jan 2015), Afib and recent PE (on Xarelto), DM type 2, HTN, Grave's disease who presents with dyspnea.   1. Acute left knee pain with suspicion of gout vs septic joint : Pt reported recent worsening pain in left leg pain that limits her mobility profoundly 2/2 pain. She has had a hx of gout (not previously confirmed by aspiration) but her knee is not usually the joint affected. Tib/fib xray of her left leg revealed left knee DJD and a large effusion that  correlates clinically with physical exam findings of the left knee.  Synovial fluid was not sent for analysis, however Orthopedics feels fluid is consistent with OA with possible component of gout; septic joint unlikely.  Her pain has resolved s/p aspiration and steroid injection.   - attempt ambulation with PT/nurse, as tolerated - continue colchicine - patient to have close follow-up appointment with [facility]  2. Acute on chronic combined systolic and diastolic congestive heart failure, resolved. Dyspnea resolved, patient is oxygenating well and breathing comfortably on room air.  Net neg 1.9 L and wt down 7lbs since admission.  - continue home dose Lasix 40mg  po BID - continue lisinopril, Toprol XL - hold BID KDur today given potassium high normal, 5.0; recheck K this AM - continue KDur at d/c at dose of daily  3. DM type 2 - stable; home medication is metformin 1000mg  BID - SSI and CBG monitoring while inpatient - resume metformin at  d/c  4. Hypertension - stable - continue lisinopril, Lasix, Toprol XL  5. Atrial fibrillation - rate controlled - continue Toprolol XL 50mg  for rate control - continue Xarelto for anticoagulation  VTE ppx:  Xarelto  Dispo: The patient is hemodynamically stable, asymptomatic and ready for d/c home today.  PT recommends SNF, however she has 24 hour supervision at home and both patient and family would like d/c to home as opposed to SNF.  I have ordered home health RN and PT.  Pt is active with [facility].  I will arrange hospital follow-up in [facility].  The patient has a cardiology appointment on 12/19/13.  CM/SW to assist with setting up transportation services for patient prior to d/c.  The patient does have a current PCP Evelena Peat, DO) and does need an [facility] hospital follow-up appointment after discharge.  The patient does have transportation limitations that hinder transportation to clinic appointments.  .Services Needed at time of discharge: Y = Yes, Blank = No PT:  Y  OT:   RN:  Y  Equipment:   Other:  transportation assistance     LOS: 4 days   Evelena Peat, DO 12/18/2013, 7:17 AM

## 2013-12-19 ENCOUNTER — Ambulatory Visit: Payer: PRIVATE HEALTH INSURANCE | Admitting: Cardiology

## 2013-12-19 LAB — GLUCOSE, CAPILLARY
Glucose-Capillary: 252 mg/dL — ABNORMAL HIGH (ref 70–99)
Glucose-Capillary: 239 mg/dL — ABNORMAL HIGH (ref 70–99)

## 2013-12-19 NOTE — Discharge Summary (Signed)
I have reviewed Dr. Tawana Scale note and I agree with the documentation as outlined in her note.

## 2013-12-20 ENCOUNTER — Telehealth: Payer: Self-pay | Admitting: *Deleted

## 2013-12-20 NOTE — Telephone Encounter (Signed)
Call to [employer] for Prior Authorization for Potassium Chloride ER 20 Meq.  Information was given.  Sent to review decision to be faxed in 24-72 hours.  Angelina Ok, RN 12/20/2013 2:07 PM

## 2013-12-21 ENCOUNTER — Telehealth: Payer: Self-pay | Admitting: *Deleted

## 2013-12-21 NOTE — Telephone Encounter (Signed)
Agree 

## 2013-12-21 NOTE — Telephone Encounter (Signed)
Home PT calls for verbal approval for 2 times weekly for 4 weeks for endurance and lower extremity strengthening. Verbal approval given. Pt c/o L knee pain and PT would like it evaluated at pt's next visit. Do you agree w/ verbal approval?

## 2013-12-26 ENCOUNTER — Telehealth: Payer: Self-pay | Admitting: Licensed Clinical Social Worker

## 2013-12-26 NOTE — Telephone Encounter (Signed)
CSW received note from front office, pt in need of transportation assistance to medical appointments.  CSW placed call to daughter on phone number provided.  CSW left message requesting return call. CSW provided contact hours and phone number.  Left message with the phone number to [facility], transportation service under medicaid benefit [phone].  Currently, pt has no appointment scheduled.  CSW offered assistance should family have need for scheduling transportation.

## 2013-12-26 NOTE — Telephone Encounter (Signed)
CSW received message from [facility] RN stating pt has expectations PCP was going to arrange transportation for all appointments.  Pt/family requesting HH to coordinate.  CSW placed call to daughter, [name].  Discussed pt has medical transportation available through her medicaid benefits.  Provided phone number to [facility].  Encouraged daughter to call [facility] to schedule hospital follow up at least 3 days out, then to contact [facility] for schedule transportation.  Family aware CSW is available to assist should there be difficulty scheduling transportation.  CSW will sign off.

## 2013-12-28 ENCOUNTER — Other Ambulatory Visit: Payer: Self-pay | Admitting: Internal Medicine

## 2013-12-29 ENCOUNTER — Ambulatory Visit (INDEPENDENT_AMBULATORY_CARE_PROVIDER_SITE_OTHER): Payer: PRIVATE HEALTH INSURANCE | Admitting: Internal Medicine

## 2013-12-29 ENCOUNTER — Encounter: Payer: Self-pay | Admitting: Internal Medicine

## 2013-12-29 VITALS — BP 143/78 | HR 110 | Temp 97.5°F | Wt 152.2 lb

## 2013-12-29 DIAGNOSIS — M25469 Effusion, unspecified knee: Secondary | ICD-10-CM

## 2013-12-29 DIAGNOSIS — I5043 Acute on chronic combined systolic (congestive) and diastolic (congestive) heart failure: Secondary | ICD-10-CM

## 2013-12-29 DIAGNOSIS — I2699 Other pulmonary embolism without acute cor pulmonale: Secondary | ICD-10-CM

## 2013-12-29 DIAGNOSIS — I509 Heart failure, unspecified: Secondary | ICD-10-CM

## 2013-12-29 DIAGNOSIS — E079 Disorder of thyroid, unspecified: Secondary | ICD-10-CM

## 2013-12-29 DIAGNOSIS — E876 Hypokalemia: Secondary | ICD-10-CM

## 2013-12-29 DIAGNOSIS — M25461 Effusion, right knee: Secondary | ICD-10-CM

## 2013-12-29 LAB — BASIC METABOLIC PANEL WITH GFR
BUN: 8 mg/dL (ref 6–23)
CHLORIDE: 98 meq/L (ref 96–112)
CO2: 25 meq/L (ref 19–32)
Calcium: 8.8 mg/dL (ref 8.4–10.5)
Creat: 0.65 mg/dL (ref 0.50–1.10)
GFR, Est African American: >89 mL/min
GFR, Est Non African American: 81 mL/min
Glucose, Bld: 175 mg/dL — ABNORMAL HIGH (ref 70–99)
Potassium: 3.5 mEq/L (ref 3.5–5.3)
SODIUM: 135 meq/L (ref 135–145)

## 2013-12-29 MED ORDER — LISINOPRIL 20 MG PO TABS: 20.0000 mg | ORAL_TABLET | Freq: Every day | ORAL | Status: DC

## 2013-12-29 NOTE — Patient Instructions (Signed)
1. Please schedule a follow up appointment for 6-8 weeks.  2. Please take all medications as prescribed.   Please see thyroid specialist!  3. If you have worsening of your symptoms or new symptoms arise, please call the clinic [phone]), or go to the ER immediately if symptoms are severe.  You have done a great job in taking all your medications. I appreciate it very much. Please continue doing that.    Thyroid Biopsy The thyroid gland is a butterfly-shaped gland situated in the front of the neck. It produces hormones which affect metabolism, growth and development, and body temperature. A thyroid biopsy is a procedure in which small samples of tissue or fluid are removed from the thyroid gland or mass and examined under a microscope. This test is done to determine the cause of thyroid problems, such as infection, cancer, or other thyroid problems. There are 2 ways to obtain samples: 1. Fine needle biopsy. Samples are removed using a thin needle inserted through the skin and into the thyroid gland or mass. 2. Open biopsy. Samples are removed after a cut (incision) is made through the skin. LET YOUR CAREGIVER KNOW ABOUT:   Allergies.  Medications taken including herbs, eye drops, over-the-counter medications, and creams.  Use of steroids (by mouth or creams).  Previous problems with anesthetics or numbing medicine.  Possibility of pregnancy, if this applies.  History of blood clots (thrombophlebitis).  History of bleeding or blood problems.  Previous surgery.  Other health problems. RISKS AND COMPLICATIONS  Bleeding from the site. The risk of bleeding is higher if you have a bleeding disorder or are taking any blood thinning medications (anticoagulants).  Infection.  Injury to structures near the thyroid gland. BEFORE THE PROCEDURE  This is a procedure that can be done as an outpatient. Confirm the time that you need to arrive for your procedure. Confirm whether there is a  need to fast or withhold any medications. A blood sample may be done to determine your blood clotting time. Medicine may be given to help you relax (sedative). PROCEDURE Fine needle biopsy. You will be awake during the procedure. You may be asked to lie on your back with your head tipped backward to extend your neck. Let your caregiver know if you cannot tolerate the positioning. An area on your neck will be cleansed. A needle is inserted through the skin of your neck. You may feel a mild discomfort during this procedure. You may be asked to avoid coughing, talking, swallowing, or making sounds during some portions of the procedure. The needle is withdrawn once tissue or fluid samples have been removed. Pressure may be applied to the neck to reduce swelling and ensure that bleeding has stopped. The samples will be sent for examination.  Open biopsy. You will be given general anesthesia. You will be asleep during the procedure. An incision is made in your neck. A sample of thyroid tissue or the mass is removed. The tissue sample or mass will be sent for examination. The sample or mass may be examined during the biopsy. If the sample or mass contains cancer cells, some or all of the thyroid gland may be removed. The incision is closed with stitches. AFTER THE PROCEDURE  Your recovery will be assessed and monitored. If there are no problems, as an outpatient, you should be able to go home shortly after the procedure. If you had a fine needle biopsy:  You may have soreness at the biopsy site for 1 to 2  days. If you had an open biopsy:   You may have soreness at the biopsy site for 3 to 4 days.  You may have a hoarse voice or sore throat for 1 to 2 days. Obtaining the Test Results It is your responsibility to obtain your test results. Do not assume everything is normal if you have not heard from your caregiver or the medical facility. It is important for you to follow up on all of your test  results. HOME CARE INSTRUCTIONS   Keeping your head raised on a pillow when you are lying down may ease biopsy site discomfort.  Supporting the back of your head and neck with both hands as you sit up from a lying position may ease biopsy site discomfort.  Only take over-the-counter or prescription medicines for pain, discomfort, or fever as directed by your caregiver.  Throat lozenges or gargling with warm salt water may help to soothe a sore throat. SEEK IMMEDIATE MEDICAL CARE IF:   You have severe bleeding from the biopsy site.  You have difficulty swallowing.  You have a fever.  You have increased pain, swelling, redness, or warmth at the biopsy site.  You notice pus coming from the biopsy site.  You have swollen glands (lymph nodes) in your neck. Document Released: 06/01/2007 Document Revised: 11/29/2012 Document Reviewed: 11/01/2008 [facility] Patient Information 2014 [place], [place].

## 2013-12-29 NOTE — Assessment & Plan Note (Addendum)
CHF exacerbation resolved. Patient claims she has felt very well since her hospital discharge. She denies SOB, chest pain, LE swelling, PND, or orthopnea. Lungs clear to auscultation, no crackles. Only trace pitting edema on exam. Weight 152 lbs today, same as on discharge. -Continue Lasix 40 mg bid + Toprol-XL 50 mg po qd + Lisinopril 20 mg po qd. -Check BMP today

## 2013-12-29 NOTE — Assessment & Plan Note (Signed)
Patient denies any pain, swelling, or irritation. On exam, full active and passive ROM, no pain elicited on exam. No significant erythema, warmth, or effusion noted.  -Continue Colchicine 0.6 mg po qd. -Will likely need to be started on Allopurinol if knee pain thought to be truly 2/2 gout flare.

## 2013-12-29 NOTE — Assessment & Plan Note (Addendum)
-  Recheck BMP today.  ADDENDUM: K 3.5 on BMP. Patient typically takes K-dur 40 mEq daily. Attempted to call patient at home as well as son and both daughters w/ no answer.  -Will try and call again later; patient should take an extra 40 mEq today (total of 80 mEq).

## 2013-12-29 NOTE — Assessment & Plan Note (Addendum)
H/o submassive PE in 08/2013. No SOB or chest pain currently.  -Compliant w/ Xarelto 20 mg po qd. No signs of bleeding.

## 2013-12-29 NOTE — Progress Notes (Signed)
Patient ID: [name], female   DOB: [birth year], 80 y.o.   MRN: [id number]   Subjective:   Patient ID: Wendy CoeHazel Marschner female   DOB: [birth year] 80 y.o.   MRN: [id number]  HPI: Ms. Wendy CoeHazel Guster is a 80 y.o. y/o female w/ PMHx of combined CHF, PAF, h/o sub-massive PE, h/o Grave's Disease s/p thyroidiectomy, now w/ left-sided thyroid nodule, DM type II, HTN, HLD, Asthma, and GERD, presents to the clinic today for a hospital follow-up visit. Patient was recently admitted for CHF exacerbation and left knee joint effusion, thought to be related to gout, discharged on 12/18/13.  Today the patient presents to the clinic w/ her son and had no specific complaints. She says she has been feeling very well since being discharged from the hospital, denies any recent SOB, chest pain, significant LE swelling, dizziness, lightheadedness, PND or orthopnea. Today, her weight is 152 lbs, unchanged from the day of discharge. The patient claims she has been very compliant w/ her medications and her daughter helps her daily w/ her medication administration.  She also denies any knee pain today. On exam, no tenderness to palpation, ROM full, no erythema, warmth, or significant effusion present. She continues to take Colchicine 0.6 mg po qd.  The patient was also hospitalized in 08/2013 for submassive PE, on Xarelto 15 mg po qd at that time. During her CTA chest, a large left-sided thyroid nodule was identified. A thyroid US was performed on 08/20/13, showed the left thyroid lobe markedly enlarged with a dominant heterogeneous lesion (5 cm) containing large calcifications. Given the patient's need for Xarelto (now on 20 mg po qd), a FNA has yet to be performed.    Past Medical History  Diagnosis Date  . Hypertension   . Diabetes mellitus   . Asthma   . Osteoarthritis     left shoulder  . Pseudogout   . Hypokalemia     a. Previously felt due to diuretics, req supplementation.  . Churg-Strauss syndrome     a. Sural nerve biopsy  02/2000.  Darene Lamer. Grave's disease     a. s/p thyroidectomy.  . Glaucoma   . Hypercholesteremia   . Venous insufficiency   . Osteopenia     DEXA 2004  . Lipoma     left inner thigh  . Cataract     left eye  . VASCULITIS   . Chronic combined systolic and diastolic heart failure     a. 0/86577/2013 EF:20-25%  . Pleural effusion, left     a. Thoracentesis 2001 - per notes, no malignant cells, was transudative.  Marland Kitchen. NSVT (nonsustained ventricular tachycardia)     a. During CHF adm 2001.  . Multifocal atrial tachycardia     a. Documented as OP 01/2012.  Marland Kitchen. Ectopic atrial tachycardia     a. Documented on tele as IP 02/2012.  . Pulmonary HTN     a. Mod by echo 02/2012.  . Valvular heart disease     a. Echo 02/2012: mod MR/TR, mild AI.  Marland Kitchen. Pericardial effusion     a. Echo 02/2012: small-mod pericardial effusion.  . Atrial fibrillation, permanent     a. Dx 04/2013, on metoprolol, dig and xarelto   Current Outpatient Prescriptions  Medication Sig Dispense Refill  . albuterol (PROVENTIL HFA;VENTOLIN HFA) 108 (90 BASE) MCG/ACT inhaler Inhale 1-2 puffs into the lungs every 6 (six) hours as needed for shortness of breath.  18 g  11  . brimonidine (ALPHAGAN P) 0.1 % SOLN Place 1  drop into the left eye every 12 (twelve) hours.  15 mL  1  . colchicine 0.6 MG tablet Take 1 tablet (0.6 mg total) by mouth daily.  5 tablet  0  . dorzolamide (TRUSOPT) 2 % ophthalmic solution Place 1 drop into the left eye 2 (two) times daily.       . Fluticasone-Salmeterol (ADVAIR) 250-50 MCG/DOSE AEPB Inhale 1 puff into the lungs 2 (two) times daily.  60 each  11  . furosemide (LASIX) 40 MG tablet Take 1 tablet (40 mg total) by mouth 2 (two) times daily.  60 tablet  2  . lisinopril (PRINIVIL,ZESTRIL) 20 MG tablet Take 1 tablet (20 mg total) by mouth daily.  30 tablet  3  . metFORMIN (GLUCOPHAGE) 1000 MG tablet Take 1,000 mg by mouth 2 (two) times daily with a meal.      . metoprolol succinate (TOPROL-XL) 50 MG 24 hr tablet TAKE 1  TABLET BY MOUTH DAILY  30 tablet  3  . potassium chloride 20 MEQ TBCR Take 40 mEq by mouth daily.  30 tablet  0  . Rivaroxaban (XARELTO) 20 MG TABS tablet Take 1 tablet (20 mg total) by mouth daily.  30 tablet  3   No current facility-administered medications for this visit.   Family History  Problem Relation Age of Onset  . Diabetes Mother   . Diabetes Father   . Heart disease Neg Hx    History   Social History  . Marital Status: Widowed    Spouse Name: N/A    Number of Children: N/A  . Years of Education: N/A   Occupational History  . Retired    Social History Main Topics  . Smoking status: Never Smoker   . Smokeless tobacco: Never Used  . Alcohol Use: No  . Drug Use: No  . Sexual Activity: None   Other Topics Concern  . None   Social History Narrative   Lives in house, with family just next door.    Never smoker, no alcohol.      Used to work at [employer].    Before that, worked in food prep.                    Review of Systems: General: Denies fever, chills, diaphoresis, appetite change and fatigue.  Respiratory: Denies SOB, DOE, cough, chest tightness, and wheezing.   Cardiovascular: Denies chest pain and palpitations.  Gastrointestinal: Denies nausea, vomiting, abdominal pain, diarrhea, constipation, blood in stool and abdominal distention.  Genitourinary: Denies dysuria, urgency, frequency, hematuria, and flank pain. Endocrine: Denies hot or cold intolerance, polyuria, and polydipsia. Musculoskeletal: Denies myalgias, back pain, joint swelling, arthralgias and gait problem.  Skin: Denies pallor, rash and wounds.  Neurological: Denies dizziness, seizures, syncope, weakness, lightheadedness, numbness and headaches.  Psychiatric/Behavioral: Denies mood changes, confusion, nervousness, sleep disturbance and agitation.  Objective:   Physical Exam: Filed Vitals:   12/29/13 1532  BP: 143/78  Pulse: 110  Temp: 97.5 F (36.4 C)    TempSrc: Oral  Weight: 152 lb 3.2 oz (69.037 kg)  SpO2: 100%   General: Vital signs reviewed.  Patient is an elderly female, in no acute distress and cooperative with exam. Sitting in a wheelchair on exam, however patient is ambulatory. Head: Normocephalic and atraumatic. Eyes: PERRL, EOMI, conjunctivae normal, No scleral icterus.  Neck: Supple, trachea midline, normal ROM, No JVD. Left-sided mass palpated, soft and mobile in nature. Non-tender. No bruit. Cardiovascular: Tachycardic, rhythm irregular, S1  normal, S2 normal, no murmurs, gallops, or rubs. Pulmonary/Chest: Air entry equal bilaterally, no wheezes, rales, or rhonchi. Abdominal: Soft, non-tender, non-distended, BS +, no masses, organomegaly, or guarding present.  Musculoskeletal: No joint deformities, erythema, or stiffness, ROM full and nontender. Extremities: Trace pitting edema,  pulses symmetric and intact bilaterally. No cyanosis or clubbing. Venous stasis changes noted in LE's. Neurological: A&O x3, Strength is normal and symmetric bilaterally, cranial nerve II-XII are grossly intact, no focal motor deficit, sensory intact to light touch bilaterally.  Skin: Warm, dry and intact. No rashes or erythema. Psychiatric: Normal mood and affect. Speech and behavior is normal. Cognition and memory normal.   Assessment & Plan:   Please see problem based assessment and plan.

## 2013-12-29 NOTE — Assessment & Plan Note (Signed)
During CTA chest in 08/2013 (discovered submassive PE) a large left-sided thyroid nodule was identified. A thyroid US was performed on 08/20/13, showed the left thyroid lobe markedly enlarged with a dominant heterogeneous lesion (5 cm) containing large calcifications. Given the patient's need for Xarelto (now on 20 mg po qd), a FNA has yet to be performed. On exam, mass is palpated, soft, mobile, non-tender.  -Referral for endocrine follow up. Patient will need FNA, however some question about appropriate management w/ Xarelto 20 mg po qd in the setting of previous PE.

## 2013-12-29 NOTE — Progress Notes (Signed)
Attending physician note: Presenting problems, physical findings, and medications, reviewed with resident physician Dr. Lars Masson and I concur with this management plan. Cephas Darby, M.D., FACP

## 2014-01-01 ENCOUNTER — Other Ambulatory Visit: Payer: Self-pay | Admitting: Internal Medicine

## 2014-01-04 IMAGING — CR DG CHEST 2V
2 series · 2 of 2 positions shown · non-contrast
Comparison: 05/20/2013

CLINICAL DATA: Hemoptysis.  Dizziness.

EXAM:
CHEST  2 VIEW

[w chest lat]
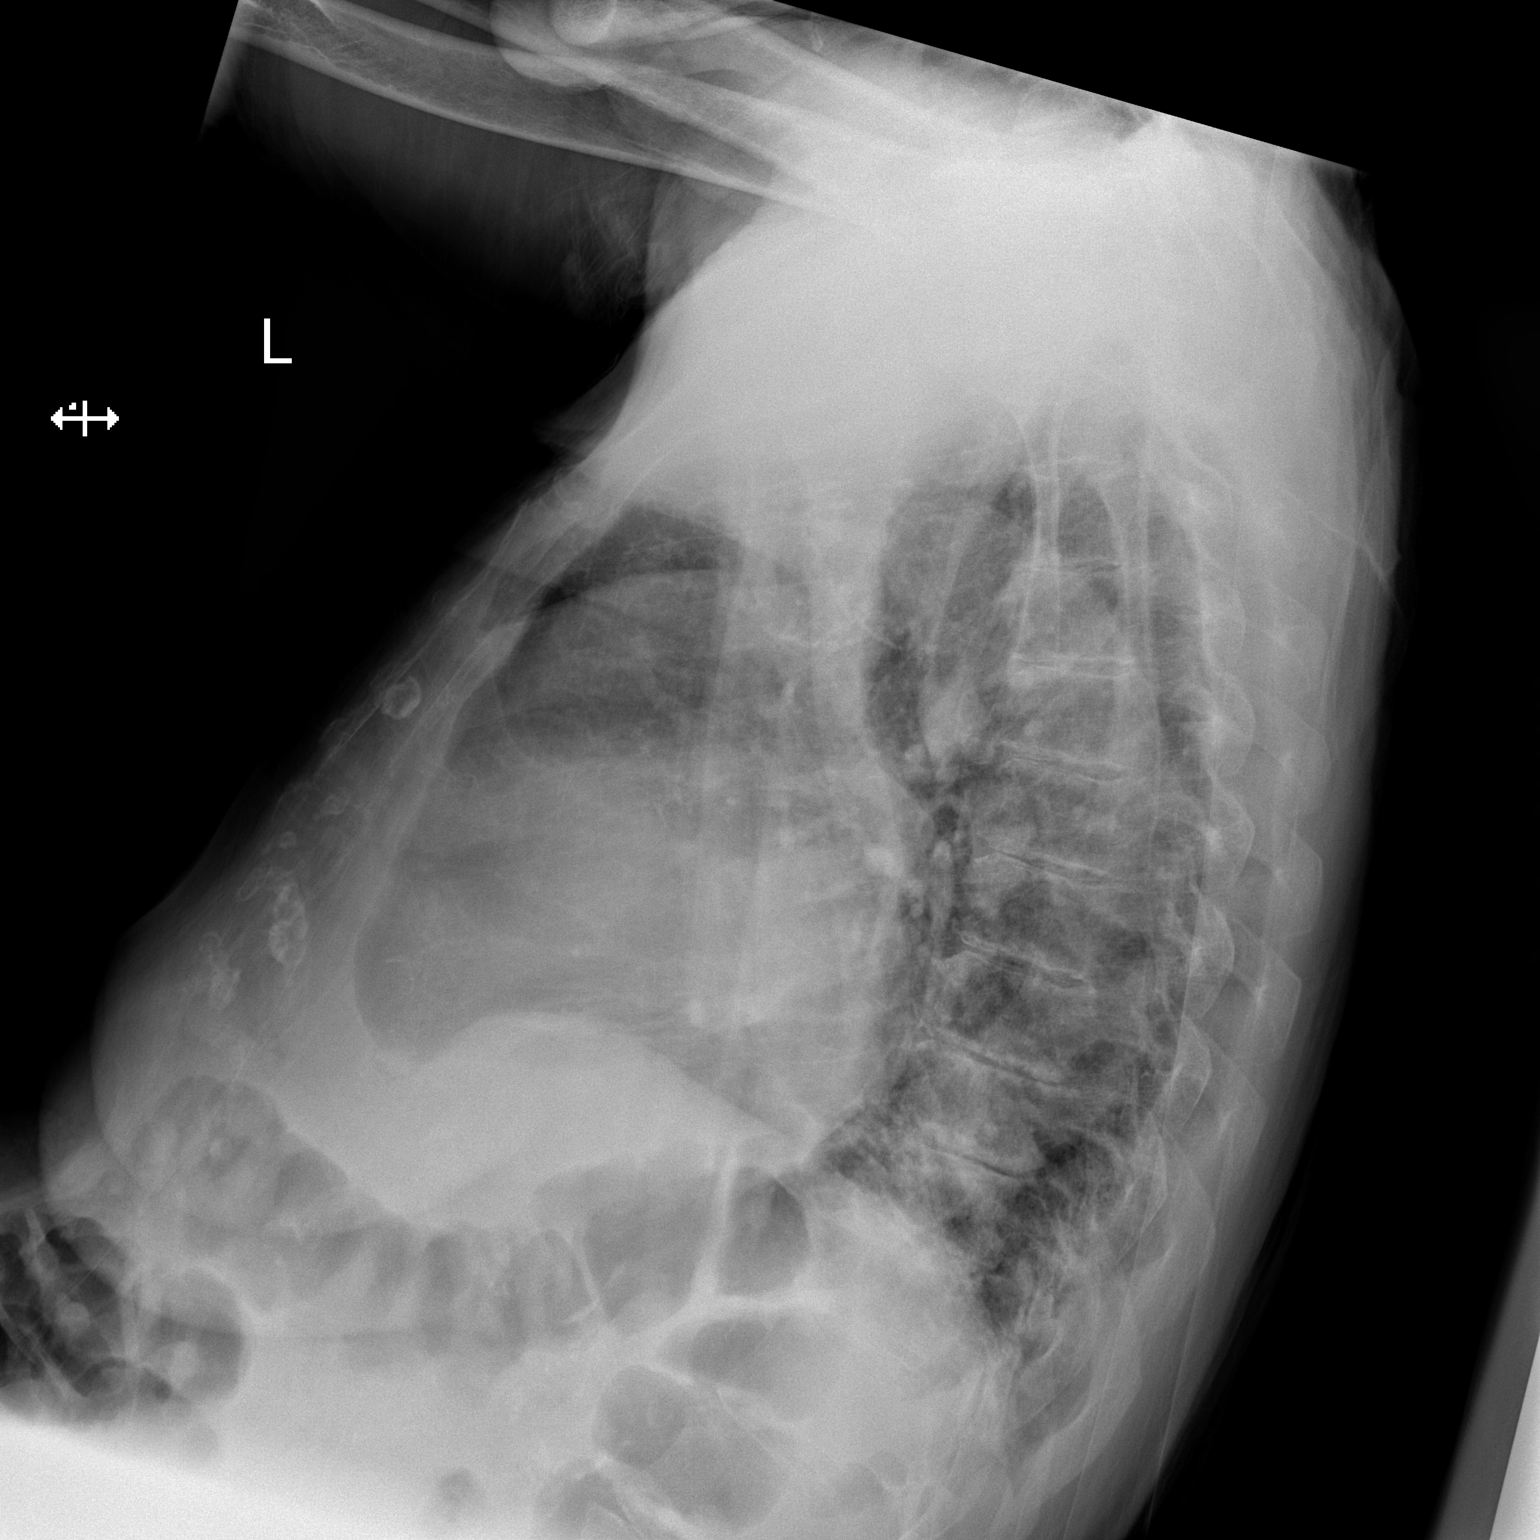

[x chest ap]
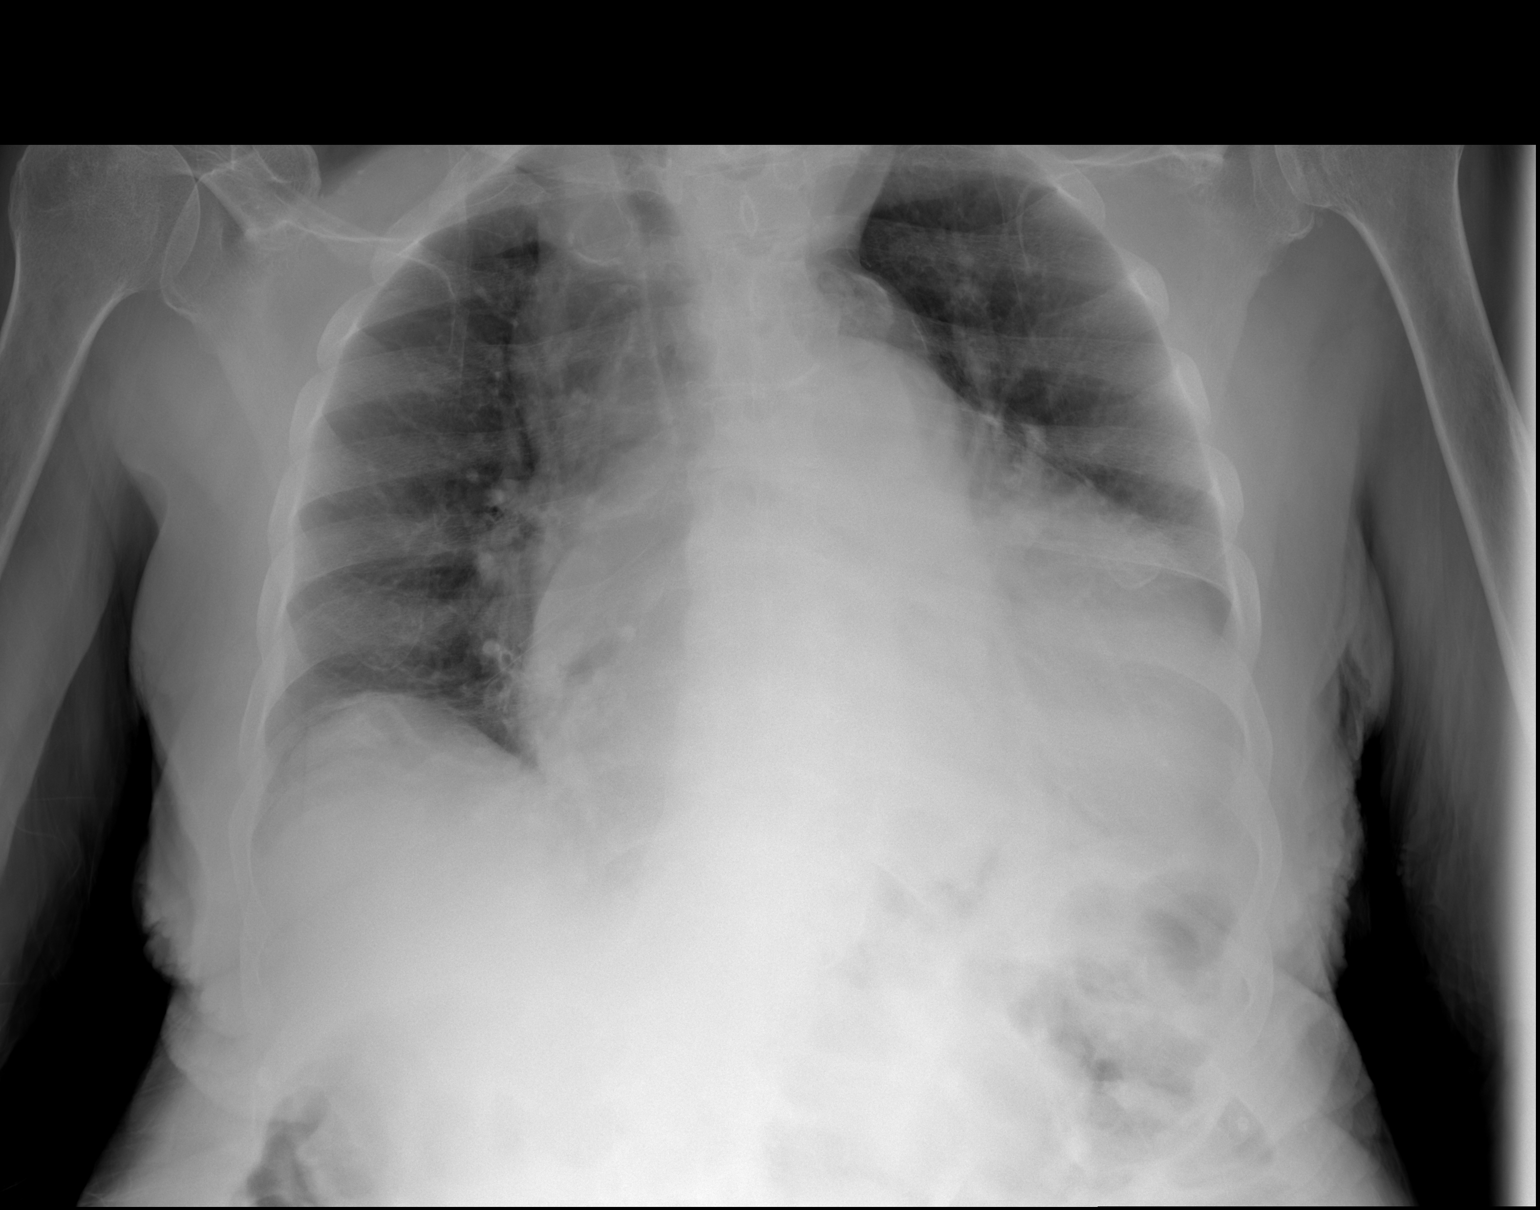

[2 of 2 positions shown; findings below may reference images not displayed]

FINDINGS: There is marked cardiac enlargement. There is no pleural effusion or
edema Both lungs are clear. The visualized skeletal structures are
unremarkable.
IMPRESSION: No active cardiopulmonary disease.

## 2014-01-07 IMAGING — US US ABDOMEN COMPLETE
1 series · 13 of 25 positions shown · non-contrast
Comparison: None.

CLINICAL DATA: Abdominal pain, nausea, vomiting.

EXAM:
ULTRASOUND ABDOMEN COMPLETE

[Series 1: us abdomen complete · 0.20mm/px · 13 of 60 slices shown]
[im 1/60]
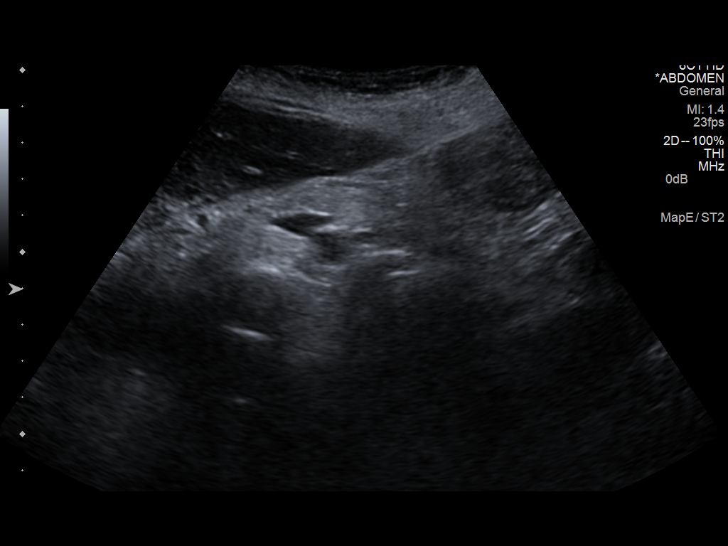
[im 5/60]
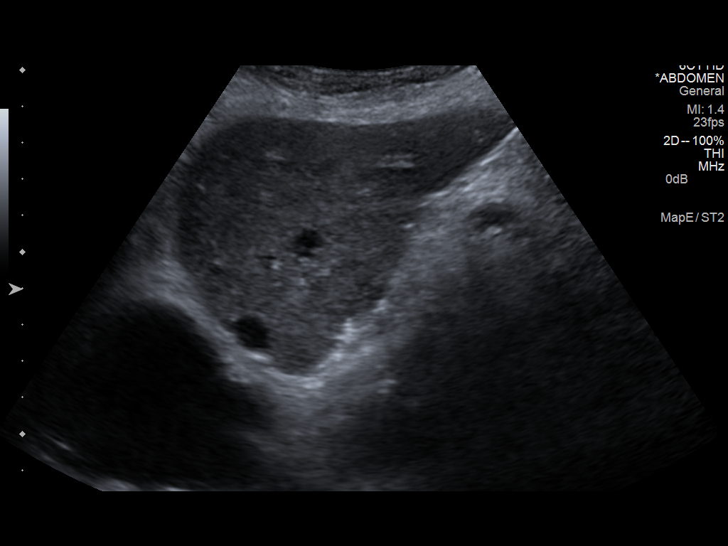
[im 10/60]
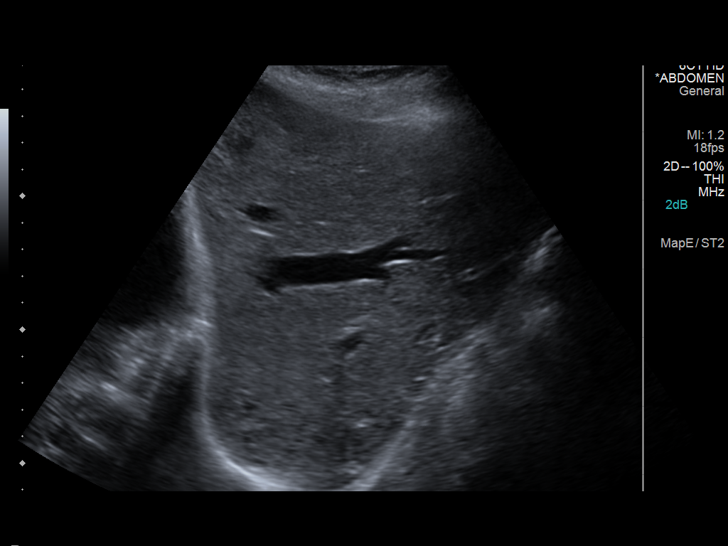
[im 15/60]
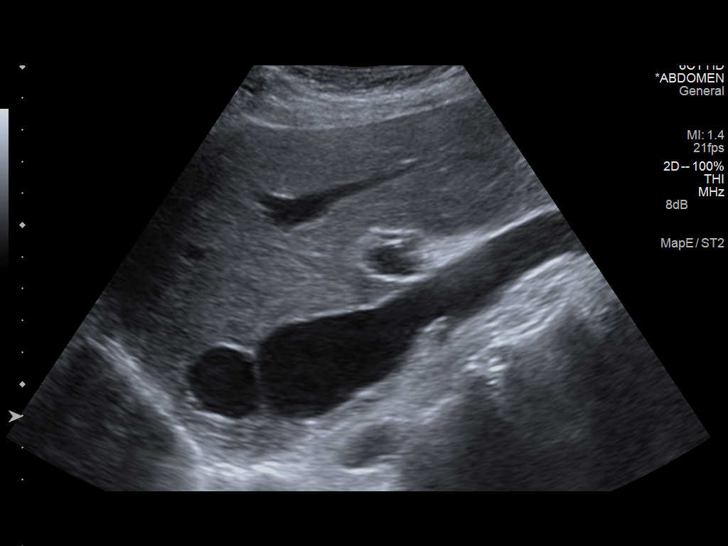
[im 20/60]
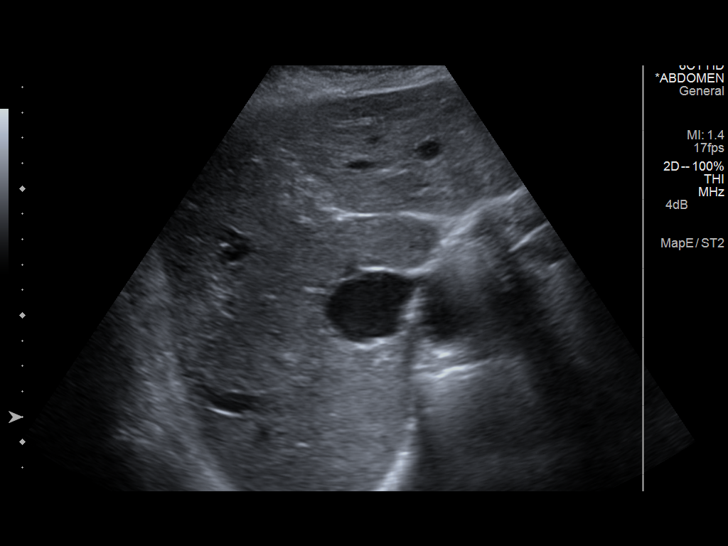
[im 25/60]
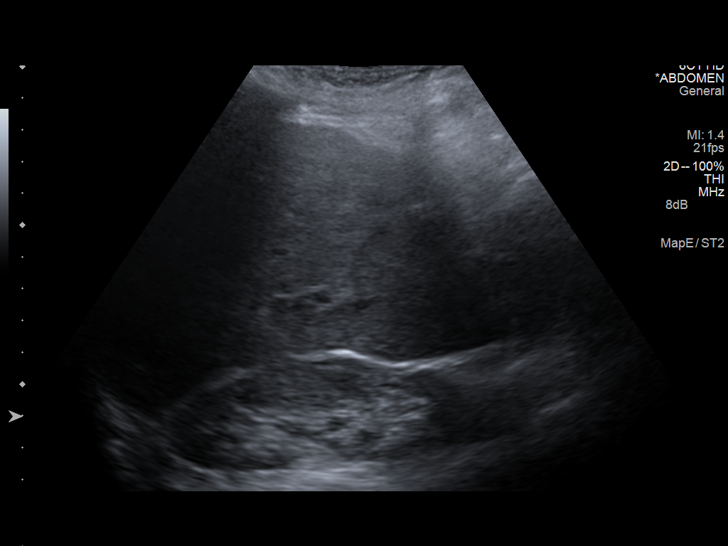
[im 30/60]
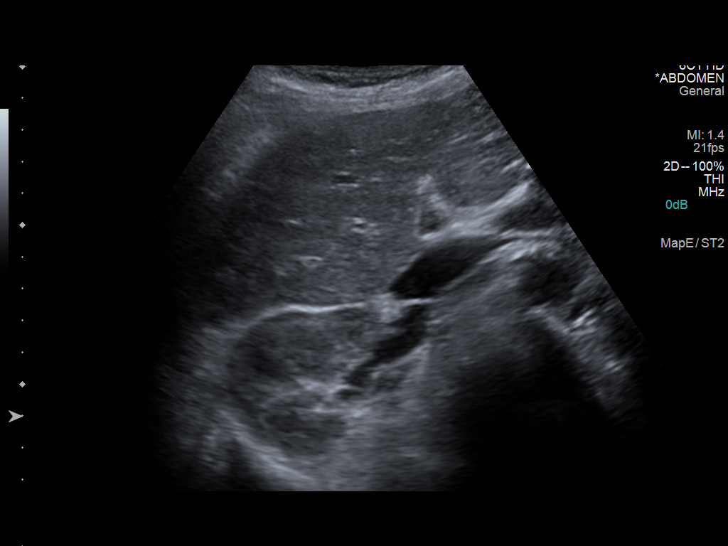
[im 35/60]
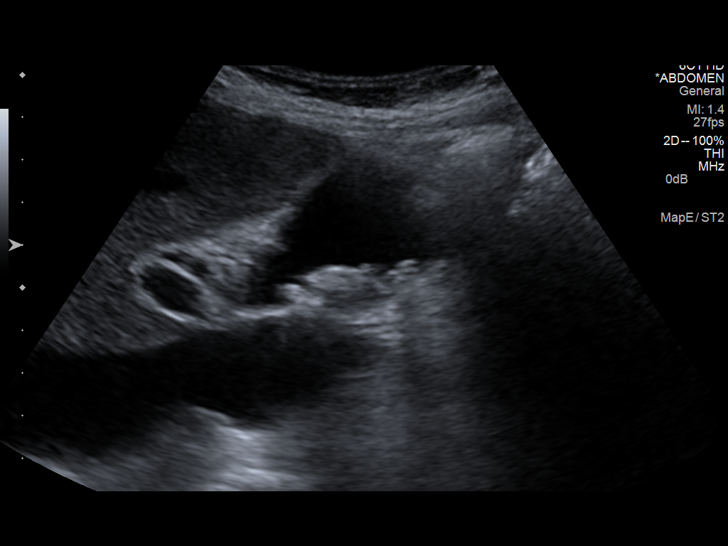
[im 40/60]
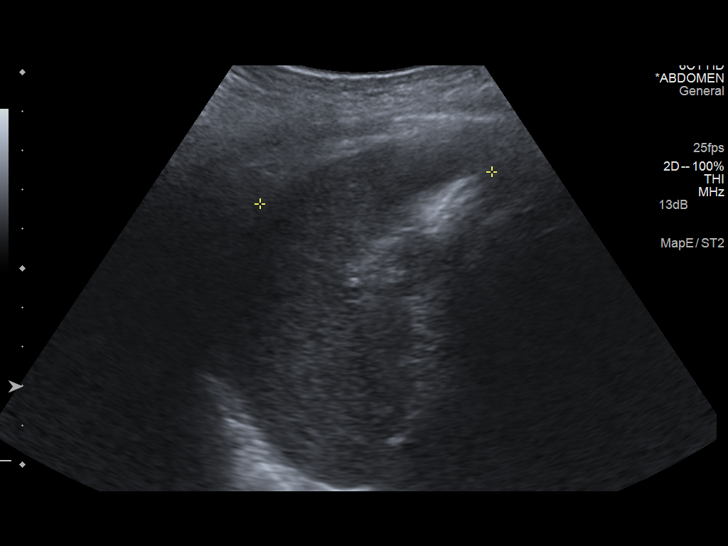
[im 45/60]
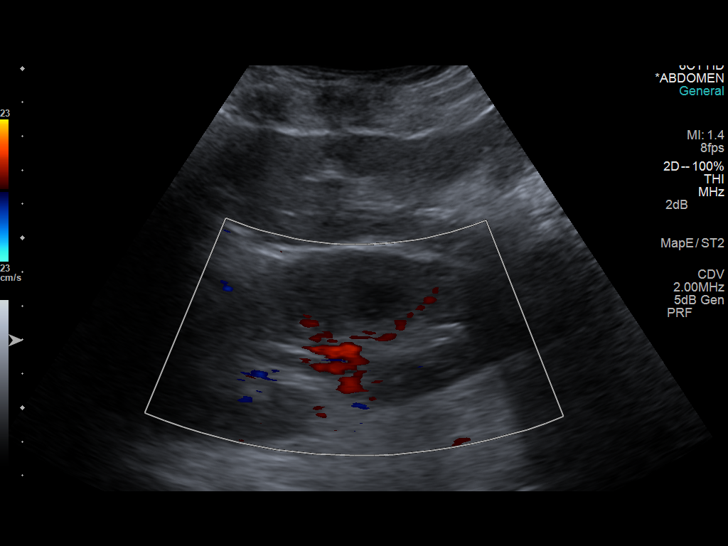
[im 50/60]
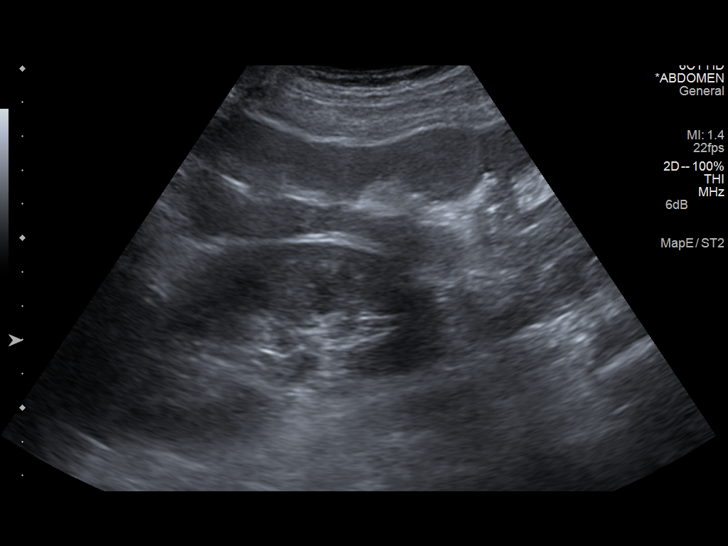
[im 55/60]
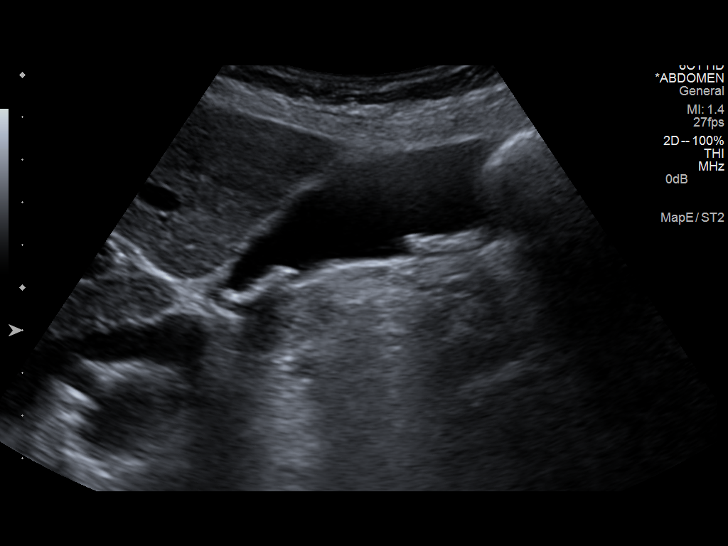
[im 60/60]
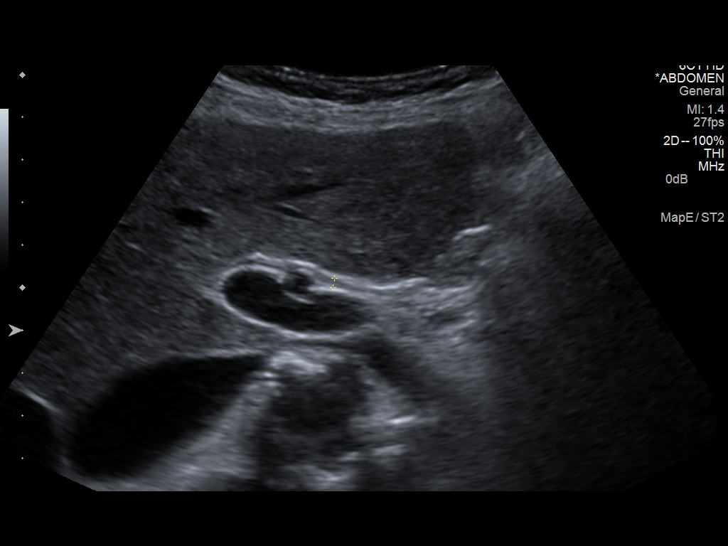

[13 of 25 positions shown; findings below may reference images not displayed]

FINDINGS: Gallbladder

There are cholelithiasis. There is no sonographic Murphy sign. There
is no gallbladder wall thickening or pericholecystic fluid.

Common bile duct

Diameter: 2.2 mm.

Liver

No focal lesion identified. Within normal limits in parenchymal
echogenicity.

IVC

No abnormality visualized.

Pancreas

Visualized portion unremarkable.

Spleen

Size and appearance within normal limits.

Right Kidney

Length: 11.3 cm. Echogenicity within normal limits. There is a
mm echogenic focus in these superior pole of the right kidney
without shadowing which may represent vascular atherosclerotic
disease versus a nonobstructing calculus. No mass or hydronephrosis
visualized.

Left Kidney

Length: 11 cm. Echogenicity within normal limits. No mass or
hydronephrosis visualized.

Abdominal aorta

No aneurysm.  Abdominal aortic atherosclerosis.
IMPRESSION: 1. Cholelithiasis without sonographic evidence of acute
cholecystitis.

2. There is a 3.7 mm echogenic focus in these superior pole of the
right kidney without shadowing which may represent vascular
atherosclerotic disease versus a nonobstructing calculus.

## 2014-01-09 ENCOUNTER — Other Ambulatory Visit (HOSPITAL_COMMUNITY): Payer: Self-pay | Admitting: Internal Medicine

## 2014-01-22 ENCOUNTER — Other Ambulatory Visit (HOSPITAL_COMMUNITY): Payer: Self-pay | Admitting: Internal Medicine

## 2014-01-23 NOTE — Telephone Encounter (Signed)
Please schedule an appointment within 1 month. 

## 2014-01-25 ENCOUNTER — Other Ambulatory Visit: Payer: Self-pay | Admitting: Internal Medicine

## 2014-01-26 ENCOUNTER — Encounter: Payer: Self-pay | Admitting: Cardiology

## 2014-02-17 ENCOUNTER — Emergency Department (HOSPITAL_COMMUNITY)
Admission: EM | Admit: 2014-02-17 | Discharge: 2014-02-17 | Disposition: A | Discharge status: Home / Self Care | Payer: PRIVATE HEALTH INSURANCE | Attending: Emergency Medicine | Admitting: Emergency Medicine

## 2014-02-17 ENCOUNTER — Encounter (HOSPITAL_COMMUNITY): Payer: Self-pay | Admitting: Emergency Medicine

## 2014-02-17 ENCOUNTER — Emergency Department (HOSPITAL_COMMUNITY): Payer: PRIVATE HEALTH INSURANCE

## 2014-02-17 DIAGNOSIS — IMO0002 Reserved for concepts with insufficient information to code with codable children: Secondary | ICD-10-CM | POA: Insufficient documentation

## 2014-02-17 DIAGNOSIS — I4729 Other ventricular tachycardia: Secondary | ICD-10-CM | POA: Insufficient documentation

## 2014-02-17 DIAGNOSIS — H409 Unspecified glaucoma: Secondary | ICD-10-CM | POA: Insufficient documentation

## 2014-02-17 DIAGNOSIS — R0602 Shortness of breath: Secondary | ICD-10-CM

## 2014-02-17 DIAGNOSIS — I472 Ventricular tachycardia, unspecified: Secondary | ICD-10-CM | POA: Insufficient documentation

## 2014-02-17 DIAGNOSIS — Z7901 Long term (current) use of anticoagulants: Secondary | ICD-10-CM | POA: Insufficient documentation

## 2014-02-17 DIAGNOSIS — E876 Hypokalemia: Secondary | ICD-10-CM | POA: Insufficient documentation

## 2014-02-17 DIAGNOSIS — I5042 Chronic combined systolic (congestive) and diastolic (congestive) heart failure: Secondary | ICD-10-CM | POA: Insufficient documentation

## 2014-02-17 DIAGNOSIS — R011 Cardiac murmur, unspecified: Secondary | ICD-10-CM | POA: Insufficient documentation

## 2014-02-17 DIAGNOSIS — E119 Type 2 diabetes mellitus without complications: Secondary | ICD-10-CM | POA: Insufficient documentation

## 2014-02-17 DIAGNOSIS — Z8739 Personal history of other diseases of the musculoskeletal system and connective tissue: Secondary | ICD-10-CM | POA: Insufficient documentation

## 2014-02-17 DIAGNOSIS — I10 Essential (primary) hypertension: Secondary | ICD-10-CM | POA: Insufficient documentation

## 2014-02-17 DIAGNOSIS — I38 Endocarditis, valve unspecified: Secondary | ICD-10-CM | POA: Insufficient documentation

## 2014-02-17 DIAGNOSIS — I4891 Unspecified atrial fibrillation: Secondary | ICD-10-CM | POA: Insufficient documentation

## 2014-02-17 DIAGNOSIS — I498 Other specified cardiac arrhythmias: Secondary | ICD-10-CM | POA: Insufficient documentation

## 2014-02-17 DIAGNOSIS — Z79899 Other long term (current) drug therapy: Secondary | ICD-10-CM | POA: Insufficient documentation

## 2014-02-17 DIAGNOSIS — J45901 Unspecified asthma with (acute) exacerbation: Secondary | ICD-10-CM | POA: Insufficient documentation

## 2014-02-17 LAB — BASIC METABOLIC PANEL
Anion gap: 16 — ABNORMAL HIGH (ref 5–15)
BUN: 8 mg/dL (ref 6–23)
CO2: 23 meq/L (ref 19–32)
Calcium: 8.9 mg/dL (ref 8.4–10.5)
Chloride: 101 mEq/L (ref 96–112)
Creatinine, Ser: 0.6 mg/dL (ref 0.50–1.10)
GFR calc Af Amer: >90 mL/min (ref >90)
GFR calc non Af Amer: 80 mL/min — ABNORMAL LOW (ref >90)
GLUCOSE: 106 mg/dL — AB (ref 70–99)
POTASSIUM: 4.1 meq/L (ref 3.7–5.3)
SODIUM: 140 meq/L (ref 137–147)

## 2014-02-17 LAB — I-STAT TROPONIN, ED: TROPONIN I, POC: 0 ng/mL (ref 0.00–0.08)

## 2014-02-17 LAB — URINE MICROSCOPIC-ADD ON

## 2014-02-17 LAB — CBC
HEMATOCRIT: 39.1 % (ref 36.0–46.0)
HEMOGLOBIN: 12.6 g/dL (ref 12.0–15.0)
MCH: 30.6 pg (ref 26.0–34.0)
MCHC: 32.2 g/dL (ref 30.0–36.0)
MCV: 94.9 fL (ref 78.0–100.0)
Platelets: 199 10*3/uL (ref 150–400)
RBC: 4.12 MIL/uL (ref 3.87–5.11)
RDW: 14 % (ref 11.5–15.5)
WBC: 3.1 10*3/uL — ABNORMAL LOW (ref 4.0–10.5)

## 2014-02-17 LAB — URINALYSIS, ROUTINE W REFLEX MICROSCOPIC
Bilirubin Urine: NEGATIVE
GLUCOSE, UA: NEGATIVE mg/dL
Ketones, ur: NEGATIVE mg/dL
LEUKOCYTES UA: NEGATIVE
Nitrite: NEGATIVE
PH: 7 (ref 5.0–8.0)
PROTEIN: 30 mg/dL — AB
Specific Gravity, Urine: 1.008 (ref 1.005–1.030)
Urobilinogen, UA: 1 mg/dL (ref 0.0–1.0)

## 2014-02-17 LAB — PRO B NATRIURETIC PEPTIDE: Pro B Natriuretic peptide (BNP): 3166 pg/mL — ABNORMAL HIGH (ref 0–450)

## 2014-02-17 NOTE — ED Provider Notes (Signed)
CSN: [id number]     Arrival date & time 02/17/14  1239 History   First MD Initiated Contact with Patient 02/17/14 1307     Chief Complaint  Patient presents with  . Shortness of Breath     (Consider location/radiation/quality/duration/timing/severity/associated sxs/prior Treatment) HPI Patient presents with shortness of breath. Symptoms started last night when she was in bed. She reports no chest pain or palpitations. No nausea, vomiting, or syncope. She takes lasix 40mg  daily for fluid retention due to heart failure. She has had similar episodes in the past where her lasix was temporarily increased. Her shortness of breath is improved with sitting up compared to laying back. She has two pillow orthopnea. Her exercise tolerance has not decreased.   Past Medical History  Diagnosis Date  . Hypertension   . Diabetes mellitus   . Asthma   . Osteoarthritis     left shoulder  . Pseudogout   . Hypokalemia     a. Previously felt due to diuretics, req supplementation.  . Churg-Strauss syndrome     a. Sural nerve biopsy 02/2000.  Darene Lamer disease     a. s/p thyroidectomy.  . Glaucoma   . Hypercholesteremia   . Venous insufficiency   . Osteopenia     DEXA 2004  . Lipoma     left inner thigh  . Cataract     left eye  . VASCULITIS   . Chronic combined systolic and diastolic heart failure     a. 03/2706 EF:20-25%  . Pleural effusion, left     a. Thoracentesis 2001 - per notes, no malignant cells, was transudative.  Marland Kitchen NSVT (nonsustained ventricular tachycardia)     a. During CHF adm 2001.  . Multifocal atrial tachycardia     a. Documented as OP 01/2012.  Marland Kitchen Ectopic atrial tachycardia     a. Documented on tele as IP 02/2012.  . Pulmonary HTN     a. Mod by echo 02/2012.  . Valvular heart disease     a. Echo 02/2012: mod MR/TR, mild AI.  Marland Kitchen Pericardial effusion     a. Echo 02/2012: small-mod pericardial effusion.  . Atrial fibrillation, permanent     a. Dx 04/2013, on metoprolol, dig and  xarelto   Past Surgical History  Procedure Laterality Date  . Abdominal hysterectomy    . Salpingoophorectomy    . Thyroidectomy    . Hemiarthroplasty hip Right 10/2010    cemented for hip fracture, femoral neck - by Eulas Post, MD  . Cataract extraction Right   . Hemiarthroplasty hip     Family History  Problem Relation Age of Onset  . Diabetes Mother   . Diabetes Father   . Heart disease Neg Hx    History  Substance Use Topics  . Smoking status: Never Smoker   . Smokeless tobacco: Never Used  . Alcohol Use: No   OB History   Grav Para Term Preterm Abortions TAB SAB Ect Mult Living                 Review of Systems  Respiratory: Positive for shortness of breath. Negative for cough and wheezing.   Cardiovascular: Negative for chest pain, palpitations and leg swelling.  Gastrointestinal: Negative for abdominal pain.      Allergies  Review of patient's allergies indicates no known allergies.  Home Medications   Prior to Admission medications   Medication Sig Start Date End Date Taking? Authorizing Provider  albuterol (PROVENTIL HFA;VENTOLIN HFA) 108 (  90 BASE) MCG/ACT inhaler Inhale 1-2 puffs into the lungs every 6 (six) hours as needed for shortness of breath. 06/14/13  Yes Rocco SereneLawrence D Klima, MD  brimonidine (ALPHAGAN P) 0.1 % SOLN Place 1 drop into the left eye every 12 (twelve) hours. 05/27/12  Yes Lars MageAnkit Garg, MD  dorzolamide (TRUSOPT) 2 % ophthalmic solution Place 1 drop into the left eye 2 (two) times daily.    Yes Historical Provider, MD  Fluticasone-Salmeterol (ADVAIR) 250-50 MCG/DOSE AEPB Inhale 1 puff into the lungs 2 (two) times daily. 06/15/13  Yes Rocco SereneLawrence D Klima, MD  furosemide (LASIX) 40 MG tablet Take 1 tablet (40 mg total) by mouth 2 (two) times daily. 09/20/13  Yes Evelena PeatAlex Wilson, DO  lisinopril (PRINIVIL,ZESTRIL) 20 MG tablet Take 1 tablet (20 mg total) by mouth daily. 12/29/13  Yes Courtney ParisEden W Jones, MD  metFORMIN (GLUCOPHAGE) 1000 MG tablet Take 1,000 mg  by mouth 2 (two) times daily with a meal.   Yes Historical Provider, MD  metoprolol succinate (TOPROL-XL) 50 MG 24 hr tablet Take 50 mg by mouth daily. Take with or immediately following a meal.   Yes Historical Provider, MD  potassium chloride SA (K-DUR,KLOR-CON) 20 MEQ tablet Take 40 mEq by mouth daily.   Yes Historical Provider, MD  rivaroxaban (XARELTO) 20 MG TABS tablet Take 20 mg by mouth every morning.   Yes Historical Provider, MD   BP 153/51  Pulse 94  Temp(Src) 98.6 F (37 C) (Oral)  Resp 15  SpO2 96% Physical Exam  Constitutional: She is oriented to person, place, and time. She appears well-developed.  Eyes: Conjunctivae and EOM are normal. Right pupil is round and reactive. Left pupil is not round and not reactive.  Neck: JVD present.  Cardiovascular: Normal rate and normal pulses.  An irregularly irregular rhythm present.  Murmur heard.  Systolic murmur is present with a grade of 3/6  Pulmonary/Chest: She has decreased breath sounds in the right lower field and the left lower field. She has wheezes (scattered that resolved with continued respirations). She has no rhonchi. She has rales in the right lower field and the left lower field.  Musculoskeletal: She exhibits edema (trace). She exhibits no tenderness.  Neurological: She is alert and oriented to person, place, and time.  Skin: Skin is warm and dry.    ED Course  Procedures (including critical care time) Labs Review Labs Reviewed  BASIC METABOLIC PANEL - Abnormal; Notable for the following:    Glucose, Bld 106 (*)    GFR calc non Af Amer 80 (*)    Anion gap 16 (*)    All other components within normal limits  URINALYSIS, ROUTINE W REFLEX MICROSCOPIC - Abnormal; Notable for the following:    Hgb urine dipstick TRACE (*)    Protein, ur 30 (*)    All other components within normal limits  CBC - Abnormal; Notable for the following:    WBC 3.1 (*)    All other components within normal limits  PRO B NATRIURETIC  PEPTIDE - Abnormal; Notable for the following:    Pro B Natriuretic peptide (BNP) 3166.0 (*)    All other components within normal limits  URINE MICROSCOPIC-ADD ON - Abnormal; Notable for the following:    Squamous Epithelial / LPF FEW (*)    All other components within normal limits  I-STAT TROPOININ, ED    Imaging Review Dg Chest 2 View  02/17/2014   CLINICAL DATA:  Progressive shortness of breath with history of asthma  and diabetes and hypertension  EXAM: CHEST  2 VIEW  COMPARISON:  PA and lateral chest of December 15, 2013  FINDINGS: The cardiac silhouette remains enlarged. There is mild prominence of the central pulmonary vascularity which is stable. There is no pulmonary interstitial or alveolar edema. There are stable coarse lung markings at the left lung base. There is mild tortuosity of the descending thoracic aorta. There is degenerative disc space narrowing at multiple thoracic levels.  IMPRESSION: Low-grade compensated CHF.  No pulmonary edema or pneumonia.   Electronically Signed   By: David  [place]   On: 02/17/2014 15:26     EKG Interpretation   Date/Time:  Friday February 17 2014 13:10:00 EDT Ventricular Rate:  106 PR Interval:    QRS Duration: 98 QT Interval:  362 QTC Calculation: 480 R Axis:   -72 Text Interpretation:  Atrial fibrillation with rapid ventricular response  with premature ventricular or aberrantly conducted complexes Left axis  deviation Anterior infarct , age undetermined Abnormal ECG Confirmed by  BEATON  MD, ROBERT ([id number]) on 02/17/2014 2:05:06 PM      MDM   Final diagnoses:  Shortness of breath   80yo female with shortness of breath. Most likely cause by excess fluid from heart failure. Patient's symptoms improved with sitting up. She had oxygen saturation in 95% range on room air. EKG with atrial fibrillation. BNP elevated but below baseline. Chest x-ray showing compensated heart failure with no pulmonary edema. No respiratory distress. Patient currently  takes lasix 40mg  BID and given instructions to take 80mg  BID and follow-up with PCP, Dr. Andrey Campanile. Patient's daughter also in the room. Patient and daughter understood and agreed with plan. Patient was stable for discharge home.    Jacquelin Hawking, MD 02/17/14 [phone]

## 2014-02-17 NOTE — ED Notes (Signed)
Per EMS, pt is from home and begin feeling short of breath last night, she tried to use her albuterol inhaler but had no relief. Pt received 10mg  albuterol and 0.5mg  atrovent with EMS. 12 lead unremarkable. Per EMS, pt lung sounds improved after albuterol treatment. A&O X4 and NAD on arrival.

## 2014-02-17 NOTE — Discharge Instructions (Signed)
Your shortness of breath seems to be related to your heart failure. You currently are not in an exacerbation, which is good. Please increase your lasix dose to 80mg  (two pills) twice daily for the next few days and make an appointment to see your primary care physician, Dr. Andrey CampanileWilson next week.  Heart Failure Heart failure means your heart has trouble pumping blood. This makes it hard for your body to work well. Heart failure is usually a long-term (chronic) condition. You must take good care of yourself and follow your doctor's treatment plan. HOME CARE  Take your heart medicine as told by your doctor.  Do not stop taking medicine unless your doctor tells you to.  Do not skip any dose of medicine.  Refill your medicines before they run out.  Take other medicines only as told by your doctor or pharmacist.  Stay active if told by your doctor. The elderly and people with severe heart failure should talk with a doctor about physical activity.  Eat heart healthy foods. Choose foods that are without trans fat and are low in saturated fat, cholesterol, and salt (sodium). This includes fresh or frozen fruits and vegetables, fish, lean meats, fat-free or low-fat dairy foods, whole grains, and high-fiber foods. Lentils and dried peas and beans (legumes) are also good choices.  Limit salt if told by your doctor.  Cook in a healthy way. Roast, grill, broil, bake, poach, steam, or stir-fry foods.  Limit fluids as told by your doctor.  Weigh yourself every morning. Do this after you pee (urinate) and before you eat breakfast. Write down your weight to give to your doctor.  Take your blood pressure and write it down if your doctor tell you to.  Ask your doctor how to check your pulse. Check your pulse as told.  Lose weight if told by your doctor.  Stop smoking or chewing tobacco. Do not use gum or patches that help you quit without your doctor's approval.  Schedule and go to doctor visits as  told.  Nonpregnant women should have no more than 1 drink a day. Men should have no more than 2 drinks a day. Talk to your doctor about drinking alcohol.  Stop illegal drug use.  Stay current with shots (immunizations).  Manage your health conditions as told by your doctor.  Learn to manage your stress.  Rest when you are tired.  If it is really hot outside:  Avoid intense activities.  Use air conditioning or fans, or get in a cooler place.  Avoid caffeine and alcohol.  Wear loose-fitting, lightweight, and light-colored clothing.  If it is really cold outside:  Avoid intense activities.  Layer your clothing.  Wear mittens or gloves, a hat, and a scarf when going outside.  Avoid alcohol.  Learn about heart failure and get support as needed.  Get help to maintain or improve your quality of life and your ability to care for yourself as needed. GET HELP IF:   You gain 03 lb/1.4 kg or more in 1 day or 05 lb/2.3 kg in a week.  You are more short of breath than usual.  You cannot do your normal activities.  You tire easily.  You cough more than normal, especially with activity.  You have any or more puffiness (swelling) in areas such as your hands, feet, ankles, or belly (abdomen).  You cannot sleep because it is hard to breathe.  You feel like your heart is beating fast (palpitations).  You get dizzy  or lightheaded when you stand up. GET HELP RIGHT AWAY IF:   You have trouble breathing.  There is a change in mental status, such as becoming less alert or not being able to focus.  You have chest pain or discomfort.  You faint. MAKE SURE YOU:   Understand these instructions.  Will watch your condition.  Will get help right away if you are not doing well or get worse. Document Released: 05/13/2008 Document Revised: 11/29/2012 Document Reviewed: 03/04/2012 [facility] Patient Information 2015 [place], [place]. This information is not intended to replace  advice given to you by your health care provider. Make sure you discuss any questions you have with your health care provider.

## 2014-02-18 NOTE — ED Provider Notes (Signed)
I saw and evaluated the patient, reviewed the resident's note and I agree with the findings and plan.   .Face to face Exam:  General:  Awake HEENT:  Atraumatic Resp:  Normal effort Abd:  Nondistended Neuro:No focal weakness Lymph: No adenopathy  EKG was reviewed and discussed with resident  Nelia Shi, MD 02/18/14 [id number]

## 2014-02-23 ENCOUNTER — Other Ambulatory Visit (HOSPITAL_COMMUNITY): Payer: Self-pay | Admitting: Internal Medicine

## 2014-02-24 ENCOUNTER — Encounter: Payer: Self-pay | Admitting: Internal Medicine

## 2014-02-24 ENCOUNTER — Ambulatory Visit (INDEPENDENT_AMBULATORY_CARE_PROVIDER_SITE_OTHER): Payer: PRIVATE HEALTH INSURANCE | Admitting: Internal Medicine

## 2014-02-24 VITALS — BP 145/97 | HR 86 | Temp 98.3°F | Ht 65.0 in | Wt 156.1 lb

## 2014-02-24 DIAGNOSIS — I509 Heart failure, unspecified: Secondary | ICD-10-CM

## 2014-02-24 DIAGNOSIS — I5043 Acute on chronic combined systolic (congestive) and diastolic (congestive) heart failure: Secondary | ICD-10-CM

## 2014-02-24 DIAGNOSIS — M7989 Other specified soft tissue disorders: Secondary | ICD-10-CM | POA: Insufficient documentation

## 2014-02-24 DIAGNOSIS — J45909 Unspecified asthma, uncomplicated: Secondary | ICD-10-CM

## 2014-02-24 MED ORDER — RIVAROXABAN 20 MG PO TABS: 20.0000 mg | ORAL_TABLET | Freq: Every morning | ORAL | Status: DC

## 2014-02-24 MED ORDER — METOPROLOL SUCCINATE ER 50 MG PO TB24: 50.0000 mg | ORAL_TABLET | Freq: Every day | ORAL | Status: DC

## 2014-02-24 MED ORDER — METFORMIN HCL 1000 MG PO TABS: 1000.0000 mg | ORAL_TABLET | Freq: Two times a day (BID) | ORAL | Status: DC

## 2014-02-24 MED ORDER — FUROSEMIDE 40 MG PO TABS: 40.0000 mg | ORAL_TABLET | Freq: Two times a day (BID) | ORAL | Status: DC

## 2014-02-24 MED ORDER — POTASSIUM CHLORIDE CRYS ER 20 MEQ PO TBCR: 40.0000 meq | EXTENDED_RELEASE_TABLET | Freq: Every day | ORAL | Status: DC

## 2014-02-24 NOTE — Patient Instructions (Signed)
1. Your lungs sound good today and you are breathing well.  Please keep using your inhalers as prescribed.  You can go back to taking Lasix 40mg  twice per day.    2. Please take all medications as prescribed.    3. If you have worsening of your symptoms or new symptoms arise (like pain in your leg or breathing problem that does not get better with inhaler), please call the clinic ([phone]), or go to the ER immediately if symptoms are severe.    4. Come back to see me for a check-up in 3 months.  Come back sooner if you have problems.

## 2014-02-24 NOTE — Assessment & Plan Note (Addendum)
Patient recently seen in ED for dyspnea.  Thought to be 2/2 to HF exacerbation as patient did have crackles on exam.  However, CXR showed stable chronic HF and she responded to duonebs given by EMS on the way to the ED.  Dyspnea is worse at night, but she denies PND and has stable 2 pillow orthopnea.  So perhaps this dyspnea was multifactorial 2/2 to mild exacerbation of asthma and HF.  ED advised increasing Lasix to 80mg  BID until she could be seen by PCP.  She reports improvement since ED discharge.  Occasional dyspnea at night responds to albuterol and she only needs it once if at all during the night.  She reports compliance with medications.  No weight recorded in ED last week but weight today is 4 pounds heavier than she was 2 months ago.  Lungs clear today, vitals stable.    - decrease Lasix back to usual dose of 40mg  BID - continue Advair and prn albuterol - RTC in 3 months or sooner is needed

## 2014-02-24 NOTE — Progress Notes (Signed)
   Subjective:    Patient ID: [name], female    DOB: [birth year], 80 y.o.   MRN: [id number]  HPI Comments: [name] is an 80 year old woman with a PMH of atrial fibrillation (on Xarelto), HTN, systolic and diastolic HF (EF 20-25%), DM type 2, asthma and glaucoma.  She was seen in ED for dyspnea.  Still with dyspnea but relieved with albuterol inhaler at night (she uses it once sometimes not at all).  She is compliant with medications including Advair and Lasix.  2 pillow orthopnea is stable.  She denies PND, chest pain, fever, lightheadedness, falls or syncope.  + leg swelling left > right, not painful.  She says it started after she got up this morning; happens on occasion and she often notes the left is bigger than the right because the "veins are bad in that leg."     Review of Systems  Constitutional: Negative for fever, chills and appetite change.  HENT: Negative for rhinorrhea, sneezing and sore throat.   Eyes: Negative for pain and visual disturbance.  Respiratory: Positive for shortness of breath.        Occasional, at night, responds to albuterol  Cardiovascular: Positive for leg swelling. Negative for chest pain.  Gastrointestinal: Positive for constipation. Negative for nausea, vomiting, diarrhea and blood in stool.       Occasional constipation  Genitourinary: Negative for dysuria.  Neurological: Negative for syncope, weakness and light-headedness.       Objective:   Physical Exam  Vitals reviewed. Constitutional: She is oriented to person, place, and time. She appears well-developed. No distress.  HENT:  Head: Normocephalic and atraumatic.  Mouth/Throat: Oropharynx is clear and moist. No oropharyngeal exudate.  Sinuses are non-tender  Eyes: EOM are normal.  Cardiovascular: Normal rate, regular rhythm and normal heart sounds.  Exam reveals no gallop and no friction rub.   No murmur heard. Pulmonary/Chest: Effort normal. No respiratory distress. She has no wheezes.  She has no rales.  Abdominal: Soft. Bowel sounds are normal. She exhibits no distension. There is no tenderness.  Musculoskeletal: Normal range of motion. She exhibits edema. She exhibits no tenderness.  2+ B/L lower extremity pitting edema (left > right); legs are non-tender; negative Homan's sign; no erythema; no ulcers or wounds on feet and legs  Lymphadenopathy:    She has no cervical adenopathy.  Neurological: She is alert and oriented to person, place, and time. No cranial nerve deficit.  Skin: Skin is warm. She is not diaphoretic.  Psychiatric: She has a normal mood and affect. Her behavior is normal.          Assessment & Plan:  Please see problem based assessment and plan.

## 2014-02-24 NOTE — Assessment & Plan Note (Addendum)
Occasional dyspnea responds to albuterol prn.  She uses once per night or not at all.   - continue Advair and prn albuterol

## 2014-02-24 NOTE — Assessment & Plan Note (Signed)
Left leg more swollen than right, but not painful, non-tender to palpation, negative Homan's and she is compliant with Xarelto making acute DVT unlikely.  No erythema, warmth or ulcers to suggest cellulitis.  This is likely due to HF and venous insufficiency.  She notes left leg is often more swollen than right though I have not seen this before.   - she was advised to seek emergency help if leg becomes painful, swelling does not resolve, she has dyspnea that does not respond to inhaler - she is to continue Lasix 40mg  BID - she is to elevate her legs when seated

## 2014-02-26 ENCOUNTER — Other Ambulatory Visit: Payer: Self-pay | Admitting: Internal Medicine

## 2014-02-27 NOTE — Progress Notes (Signed)
Case discussed with Dr. Wilson at the time of the visit.  We reviewed the resident's history and exam and pertinent patient test results.  I agree with the assessment, diagnosis, and plan of care documented in the resident's note. 

## 2014-03-07 ENCOUNTER — Other Ambulatory Visit: Payer: Self-pay | Admitting: Internal Medicine

## 2014-03-08 NOTE — Addendum Note (Signed)
Addended by: Neomia Dear on: 03/08/2014 07:41 AM   Modules accepted: Orders

## 2014-04-05 ENCOUNTER — Emergency Department (HOSPITAL_COMMUNITY): Payer: PRIVATE HEALTH INSURANCE

## 2014-04-05 ENCOUNTER — Inpatient Hospital Stay (HOSPITAL_COMMUNITY)
Admission: EM | Admit: 2014-04-05 | Discharge: 2014-04-13 | DRG: 292 | Disposition: A | Discharge status: Home / Self Care | Payer: PRIVATE HEALTH INSURANCE | Attending: Internal Medicine | Admitting: Internal Medicine

## 2014-04-05 ENCOUNTER — Encounter (HOSPITAL_COMMUNITY): Payer: Self-pay | Admitting: Emergency Medicine

## 2014-04-05 ENCOUNTER — Ambulatory Visit: Payer: PRIVATE HEALTH INSURANCE | Admitting: Internal Medicine

## 2014-04-05 DIAGNOSIS — E119 Type 2 diabetes mellitus without complications: Secondary | ICD-10-CM | POA: Diagnosis present

## 2014-04-05 DIAGNOSIS — T380X5A Adverse effect of glucocorticoids and synthetic analogues, initial encounter: Secondary | ICD-10-CM | POA: Diagnosis present

## 2014-04-05 DIAGNOSIS — I059 Rheumatic mitral valve disease, unspecified: Secondary | ICD-10-CM | POA: Diagnosis present

## 2014-04-05 DIAGNOSIS — M1990 Unspecified osteoarthritis, unspecified site: Secondary | ICD-10-CM | POA: Diagnosis present

## 2014-04-05 DIAGNOSIS — R0902 Hypoxemia: Secondary | ICD-10-CM | POA: Diagnosis present

## 2014-04-05 DIAGNOSIS — I5042 Chronic combined systolic (congestive) and diastolic (congestive) heart failure: Secondary | ICD-10-CM | POA: Diagnosis present

## 2014-04-05 DIAGNOSIS — J441 Chronic obstructive pulmonary disease with (acute) exacerbation: Secondary | ICD-10-CM | POA: Diagnosis present

## 2014-04-05 DIAGNOSIS — H409 Unspecified glaucoma: Secondary | ICD-10-CM | POA: Diagnosis present

## 2014-04-05 DIAGNOSIS — M3130 Wegener's granulomatosis without renal involvement: Secondary | ICD-10-CM | POA: Diagnosis present

## 2014-04-05 DIAGNOSIS — Z86718 Personal history of other venous thrombosis and embolism: Secondary | ICD-10-CM

## 2014-04-05 DIAGNOSIS — J45901 Unspecified asthma with (acute) exacerbation: Secondary | ICD-10-CM

## 2014-04-05 DIAGNOSIS — I509 Heart failure, unspecified: Secondary | ICD-10-CM | POA: Diagnosis present

## 2014-04-05 DIAGNOSIS — I4891 Unspecified atrial fibrillation: Secondary | ICD-10-CM | POA: Diagnosis present

## 2014-04-05 DIAGNOSIS — I482 Chronic atrial fibrillation, unspecified: Secondary | ICD-10-CM | POA: Diagnosis present

## 2014-04-05 DIAGNOSIS — J9819 Other pulmonary collapse: Secondary | ICD-10-CM | POA: Diagnosis present

## 2014-04-05 DIAGNOSIS — I959 Hypotension, unspecified: Secondary | ICD-10-CM | POA: Diagnosis present

## 2014-04-05 DIAGNOSIS — I2789 Other specified pulmonary heart diseases: Secondary | ICD-10-CM | POA: Diagnosis present

## 2014-04-05 DIAGNOSIS — J449 Chronic obstructive pulmonary disease, unspecified: Secondary | ICD-10-CM

## 2014-04-05 DIAGNOSIS — E876 Hypokalemia: Secondary | ICD-10-CM | POA: Diagnosis present

## 2014-04-05 DIAGNOSIS — I5043 Acute on chronic combined systolic (congestive) and diastolic (congestive) heart failure: Principal | ICD-10-CM | POA: Diagnosis present

## 2014-04-05 DIAGNOSIS — Z7901 Long term (current) use of anticoagulants: Secondary | ICD-10-CM

## 2014-04-05 DIAGNOSIS — E785 Hyperlipidemia, unspecified: Secondary | ICD-10-CM | POA: Diagnosis present

## 2014-04-05 DIAGNOSIS — J45909 Unspecified asthma, uncomplicated: Secondary | ICD-10-CM | POA: Diagnosis present

## 2014-04-05 DIAGNOSIS — I38 Endocarditis, valve unspecified: Secondary | ICD-10-CM | POA: Diagnosis present

## 2014-04-05 DIAGNOSIS — I5033 Acute on chronic diastolic (congestive) heart failure: Secondary | ICD-10-CM

## 2014-04-05 DIAGNOSIS — I10 Essential (primary) hypertension: Secondary | ICD-10-CM | POA: Diagnosis present

## 2014-04-05 DIAGNOSIS — I079 Rheumatic tricuspid valve disease, unspecified: Secondary | ICD-10-CM | POA: Diagnosis present

## 2014-04-05 DIAGNOSIS — R0602 Shortness of breath: Secondary | ICD-10-CM | POA: Diagnosis not present

## 2014-04-05 DIAGNOSIS — R0600 Dyspnea, unspecified: Secondary | ICD-10-CM | POA: Diagnosis present

## 2014-04-05 DIAGNOSIS — I4819 Other persistent atrial fibrillation: Secondary | ICD-10-CM

## 2014-04-05 HISTORY — DX: Shortness of breath: R06.02

## 2014-04-05 LAB — COMPREHENSIVE METABOLIC PANEL
ALK PHOS: 86 U/L (ref 39–117)
ALT: 8 U/L (ref 0–35)
ANION GAP: 15 (ref 5–15)
AST: 50 U/L — ABNORMAL HIGH (ref 0–37)
Albumin: 3.5 g/dL (ref 3.5–5.2)
BILIRUBIN TOTAL: 1.4 mg/dL — AB (ref 0.3–1.2)
BUN: 13 mg/dL (ref 6–23)
CHLORIDE: 99 meq/L (ref 96–112)
CO2: 19 mEq/L (ref 19–32)
Calcium: 8.8 mg/dL (ref 8.4–10.5)
Creatinine, Ser: 0.63 mg/dL (ref 0.50–1.10)
GFR calc Af Amer: >90 mL/min (ref >90)
GFR calc non Af Amer: 79 mL/min — ABNORMAL LOW (ref >90)
GLUCOSE: 111 mg/dL — AB (ref 70–99)
Sodium: 133 mEq/L — ABNORMAL LOW (ref 137–147)
Total Protein: 7.4 g/dL (ref 6.0–8.3)

## 2014-04-05 LAB — CBC WITH DIFFERENTIAL/PLATELET
Basophils Absolute: 0.1 10*3/uL (ref 0.0–0.1)
Basophils Relative: 2 % — ABNORMAL HIGH (ref 0–1)
EOS PCT: 2 % (ref 0–5)
Eosinophils Absolute: 0.1 10*3/uL (ref 0.0–0.7)
HCT: 38.6 % (ref 36.0–46.0)
Hemoglobin: 12.5 g/dL (ref 12.0–15.0)
LYMPHS ABS: 0.8 10*3/uL (ref 0.7–4.0)
LYMPHS PCT: 25 % (ref 12–46)
MCH: 29.8 pg (ref 26.0–34.0)
MCHC: 32.4 g/dL (ref 30.0–36.0)
MCV: 91.9 fL (ref 78.0–100.0)
MONO ABS: 0.6 10*3/uL (ref 0.1–1.0)
Monocytes Relative: 20 % — ABNORMAL HIGH (ref 3–12)
Neutro Abs: 1.6 10*3/uL — ABNORMAL LOW (ref 1.7–7.7)
Neutrophils Relative %: 51 % (ref 43–77)
Platelets: 198 10*3/uL (ref 150–400)
RBC: 4.2 MIL/uL (ref 3.87–5.11)
RDW: 14.9 % (ref 11.5–15.5)
WBC: 3.2 10*3/uL — AB (ref 4.0–10.5)

## 2014-04-05 LAB — PRO B NATRIURETIC PEPTIDE: Pro B Natriuretic peptide (BNP): 4201 pg/mL — ABNORMAL HIGH (ref 0–450)

## 2014-04-05 LAB — BASIC METABOLIC PANEL
Anion gap: 16 — ABNORMAL HIGH (ref 5–15)
BUN: 12 mg/dL (ref 6–23)
CALCIUM: 9.2 mg/dL (ref 8.4–10.5)
CHLORIDE: 99 meq/L (ref 96–112)
CO2: 24 meq/L (ref 19–32)
Creatinine, Ser: 0.69 mg/dL (ref 0.50–1.10)
GFR calc Af Amer: 89 mL/min — ABNORMAL LOW (ref >90)
GFR calc non Af Amer: 77 mL/min — ABNORMAL LOW (ref >90)
GLUCOSE: 126 mg/dL — AB (ref 70–99)
Potassium: 4 mEq/L (ref 3.7–5.3)
SODIUM: 139 meq/L (ref 137–147)

## 2014-04-05 LAB — I-STAT CHEM 8, ED
BUN: 12 mg/dL (ref 6–23)
CHLORIDE: 102 meq/L (ref 96–112)
CREATININE: 0.7 mg/dL (ref 0.50–1.10)
Calcium, Ion: 1.06 mmol/L — ABNORMAL LOW (ref 1.13–1.30)
GLUCOSE: 125 mg/dL — AB (ref 70–99)
HCT: 43 % (ref 36.0–46.0)
Hemoglobin: 14.6 g/dL (ref 12.0–15.0)
POTASSIUM: 4 meq/L (ref 3.7–5.3)
Sodium: 137 mEq/L (ref 137–147)
TCO2: 25 mmol/L (ref 0–100)

## 2014-04-05 LAB — I-STAT TROPONIN, ED: Troponin i, poc: 0 ng/mL (ref 0.00–0.08)

## 2014-04-05 LAB — GLUCOSE, CAPILLARY
GLUCOSE-CAPILLARY: 247 mg/dL — AB (ref 70–99)
Glucose-Capillary: 214 mg/dL — ABNORMAL HIGH (ref 70–99)

## 2014-04-05 LAB — TROPONIN I
Troponin I: <0.3 ng/mL (ref <0.30)
Troponin I: <0.3 ng/mL (ref <0.30)

## 2014-04-05 LAB — HEMOGLOBIN A1C
Hgb A1c MFr Bld: 7.9 % — ABNORMAL HIGH (ref <5.7)
Mean Plasma Glucose: 180 mg/dL — ABNORMAL HIGH (ref <117)

## 2014-04-05 MED ORDER — ACETAMINOPHEN 325 MG PO TABS: 650.0000 mg | ORAL_TABLET | Freq: Four times a day (QID) | ORAL | Status: DC | PRN

## 2014-04-05 MED ORDER — PREDNISONE 50 MG PO TABS: 60.0000 mg | ORAL_TABLET | Freq: Once | ORAL | Status: DC

## 2014-04-05 MED ORDER — FUROSEMIDE 10 MG/ML IJ SOLN
40.0000 mg | Freq: Once | INTRAMUSCULAR | Status: AC
Start: 2014-04-05 — End: 2014-04-05
  Administered 2014-04-05: 40 mg via INTRAVENOUS
  Filled 2014-04-05: qty 4

## 2014-04-05 MED ORDER — RIVAROXABAN 20 MG PO TABS
20.0000 mg | ORAL_TABLET | Freq: Every day | ORAL | Status: DC
  Administered 2014-04-05 – 2014-04-12 (×8): 20 mg via ORAL
  Filled 2014-04-05 (×9): qty 1

## 2014-04-05 MED ORDER — IPRATROPIUM BROMIDE 0.02 % IN SOLN
0.5000 mg | Freq: Once | RESPIRATORY_TRACT | Status: AC
  Administered 2014-04-05: 0.5 mg via RESPIRATORY_TRACT

## 2014-04-05 MED ORDER — MOMETASONE FURO-FORMOTEROL FUM 100-5 MCG/ACT IN AERO
2.0000 | INHALATION_SPRAY | Freq: Two times a day (BID) | RESPIRATORY_TRACT | Status: DC
  Administered 2014-04-06 – 2014-04-13 (×15): 2 via RESPIRATORY_TRACT
  Filled 2014-04-05 (×3): qty 8.8

## 2014-04-05 MED ORDER — INSULIN ASPART 100 UNIT/ML ~~LOC~~ SOLN
0.0000 [IU] | Freq: Three times a day (TID) | SUBCUTANEOUS | Status: DC
  Administered 2014-04-05: 3 [IU] via SUBCUTANEOUS
  Administered 2014-04-06: 2 [IU] via SUBCUTANEOUS
  Administered 2014-04-06 – 2014-04-07 (×2): 5 [IU] via SUBCUTANEOUS
  Administered 2014-04-07: 9 [IU] via SUBCUTANEOUS
  Administered 2014-04-07 – 2014-04-08 (×2): 3 [IU] via SUBCUTANEOUS
  Administered 2014-04-08: 5 [IU] via SUBCUTANEOUS
  Administered 2014-04-09: 3 [IU] via SUBCUTANEOUS
  Administered 2014-04-09: 1 [IU] via SUBCUTANEOUS
  Administered 2014-04-10 – 2014-04-12 (×4): 2 [IU] via SUBCUTANEOUS
  Administered 2014-04-12 – 2014-04-13 (×2): 1 [IU] via SUBCUTANEOUS
  Administered 2014-04-13: 5 [IU] via SUBCUTANEOUS

## 2014-04-05 MED ORDER — IPRATROPIUM BROMIDE 0.02 % IN SOLN
RESPIRATORY_TRACT | Status: AC
  Administered 2014-04-05: 0.5 mg via RESPIRATORY_TRACT
  Filled 2014-04-05: qty 2.5

## 2014-04-05 MED ORDER — ALBUTEROL SULFATE (2.5 MG/3ML) 0.083% IN NEBU
5.0000 mg | INHALATION_SOLUTION | Freq: Once | RESPIRATORY_TRACT | Status: AC
  Administered 2014-04-05: 5 mg via RESPIRATORY_TRACT

## 2014-04-05 MED ORDER — ACETAMINOPHEN 650 MG RE SUPP: 650.0000 mg | Freq: Four times a day (QID) | RECTAL | Status: DC | PRN

## 2014-04-05 MED ORDER — LEVALBUTEROL HCL 0.63 MG/3ML IN NEBU
0.6300 mg | INHALATION_SOLUTION | Freq: Once | RESPIRATORY_TRACT | Status: AC
  Administered 2014-04-05: 0.63 mg via RESPIRATORY_TRACT
  Filled 2014-04-05: qty 3

## 2014-04-05 MED ORDER — ALBUTEROL SULFATE (2.5 MG/3ML) 0.083% IN NEBU
INHALATION_SOLUTION | RESPIRATORY_TRACT | Status: AC
  Administered 2014-04-05: 5 mg via RESPIRATORY_TRACT
  Filled 2014-04-05: qty 6

## 2014-04-05 MED ORDER — BRIMONIDINE TARTRATE 0.2 % OP SOLN
1.0000 [drp] | Freq: Two times a day (BID) | OPHTHALMIC | Status: DC
  Administered 2014-04-05 – 2014-04-13 (×17): 1 [drp] via OPHTHALMIC
  Filled 2014-04-05: qty 5

## 2014-04-05 MED ORDER — LISINOPRIL 20 MG PO TABS
20.0000 mg | ORAL_TABLET | Freq: Every day | ORAL | Status: DC
  Administered 2014-04-05 – 2014-04-11 (×7): 20 mg via ORAL
  Filled 2014-04-05 (×8): qty 1

## 2014-04-05 MED ORDER — IPRATROPIUM BROMIDE 0.02 % IN SOLN
0.5000 mg | Freq: Once | RESPIRATORY_TRACT | Status: AC
  Administered 2014-04-05: 0.5 mg via RESPIRATORY_TRACT
  Filled 2014-04-05: qty 2.5

## 2014-04-05 MED ORDER — METOPROLOL SUCCINATE ER 50 MG PO TB24
50.0000 mg | ORAL_TABLET | Freq: Every day | ORAL | Status: DC
  Administered 2014-04-05 – 2014-04-09 (×5): 50 mg via ORAL
  Filled 2014-04-05 (×6): qty 1

## 2014-04-05 MED ORDER — FUROSEMIDE 10 MG/ML IJ SOLN
40.0000 mg | Freq: Once | INTRAMUSCULAR | Status: AC
  Administered 2014-04-05: 40 mg via INTRAVENOUS
  Filled 2014-04-05: qty 4

## 2014-04-05 MED ORDER — SODIUM CHLORIDE 0.9 % IJ SOLN
3.0000 mL | Freq: Two times a day (BID) | INTRAMUSCULAR | Status: DC
  Administered 2014-04-05 – 2014-04-13 (×17): 3 mL via INTRAVENOUS

## 2014-04-05 MED ORDER — DORZOLAMIDE HCL 2 % OP SOLN
1.0000 [drp] | Freq: Two times a day (BID) | OPHTHALMIC | Status: DC
Start: 2014-04-05 — End: 2014-04-13
  Administered 2014-04-05 – 2014-04-13 (×16): 1 [drp] via OPHTHALMIC
  Filled 2014-04-05: qty 10

## 2014-04-05 MED ORDER — CETYLPYRIDINIUM CHLORIDE 0.05 % MT LIQD
7.0000 mL | Freq: Two times a day (BID) | OROMUCOSAL | Status: DC
  Administered 2014-04-06 – 2014-04-13 (×14): 7 mL via OROMUCOSAL

## 2014-04-05 MED ORDER — IPRATROPIUM-ALBUTEROL 0.5-2.5 (3) MG/3ML IN SOLN
3.0000 mL | RESPIRATORY_TRACT | Status: DC | PRN
  Administered 2014-04-06 – 2014-04-08 (×6): 3 mL via RESPIRATORY_TRACT
  Filled 2014-04-05 (×6): qty 3

## 2014-04-05 MED ORDER — FUROSEMIDE 10 MG/ML IJ SOLN
40.0000 mg | Freq: Two times a day (BID) | INTRAMUSCULAR | Status: DC
  Administered 2014-04-06 – 2014-04-07 (×3): 40 mg via INTRAVENOUS
  Filled 2014-04-05 (×5): qty 4

## 2014-04-05 MED ORDER — BRIMONIDINE TARTRATE 0.15 % OP SOLN
1.0000 [drp] | Freq: Two times a day (BID) | OPHTHALMIC | Status: DC
  Filled 2014-04-05: qty 5

## 2014-04-05 NOTE — H&P (Signed)
Date: 04/05/2014               Patient Name:  Ferne CoeHazel Lybrand MRN: [id number]  DOB: [birth year] Age / Sex: 80 y.o., female   PCP: Yolanda MangesAlex M Wilson, DO         Medical Service: Internal Medicine Teaching Service         Attending Physician: Dr. Aletta EdouardShilpa Bhardwaj, MD    First Contact: Dr. Senaida Oresichardson Pager: [phone]  Second Contact: Dr. Zada GirtKazibwe Pager: [phone]       After Hours (After 5p/  First Contact Pager: [phone]  weekends / holidays): Second Contact Pager: [phone]   Chief Complaint: shortness of breath, cough  History of Present Illness: Mrs. Broadus JohnWarren is a 80 yo female with PMHx of Chronic combined systolic and diastolic congestive heart failure (Echo in 08/2013 showed EF 40-45% and grade 3 diastolic dyfunction, moderate to severe mitral and tricuspid regurgitation, and pulmonary HTN), Asthma, Graves disease s/p thyroidectomy, HLD, permanent atrial fibrillation on metoprolol and Xarelto, and DM type II who presents to the ED with complaint of worsening shortness of breath and cough. Patient states she developed shortness of breath and a productive cough with yellow-white sputum 2 days ago. Today, she developed audible wheezing and decided to come into the ED. Her shortness of breath is worse on exertion. She tried using her albuterol and Advair inhalers at home, but it did not give her much relief. Patient also admits to intermittent hemoptysis. She denies increased weight gain or leg swelling. She does admit to previous chest pain that started 2 days ago located on the left side of her sternum, dull, 6/10 pain that has occurred once 2 days ago and once yesterday and lasted for 5 minutes and did not return. The pain did not radiate, is not worse with inspiration and is not reproducible with palpation. Patient sleeps on 2 pillows at night which is normal for her. She has been taking all of her medications as prescribed without any missed doses. She denies any recent illness, denies any sick contacts,  denies recent travel or sudden onset of chest pain or shortness of breath. Patient is active at home and mobile. She walks around at home and participates in household chores.  She was recently seen in the ED on 02/18/14 for dyspnea for which she was discharged from the ED in stable condition. She states that her symptoms are more severe this time. She denies fever, chills, nausea, vomiting, abdominal pain, diarrhea, dysuria, or any recent falls.  Meds: Current Facility-Administered Medications  Medication Dose Route Frequency Provider Last Rate Last Dose  . acetaminophen (TYLENOL) tablet 650 mg  650 mg Oral Q6H PRN Baltazar ApoSamaya J Qureshi, MD       Or  . acetaminophen (TYLENOL) suppository 650 mg  650 mg Rectal Q6H PRN Baltazar ApoSamaya J Qureshi, MD      . brimonidine (ALPHAGAN) 0.2 % ophthalmic solution 1 drop  1 drop Left Eye BID Aletta EdouardShilpa Bhardwaj, MD      . dorzolamide (TRUSOPT) 2 % ophthalmic solution 1 drop  1 drop Left Eye BID Baltazar ApoSamaya J Qureshi, MD      . insulin aspart (novoLOG) injection 0-9 Units  0-9 Units Subcutaneous TID WC Baltazar ApoSamaya J Qureshi, MD      . ipratropium-albuterol (DUONEB) 0.5-2.5 (3) MG/3ML nebulizer solution 3 mL  3 mL Nebulization Q4H PRN Baltazar ApoSamaya J Qureshi, MD      . lisinopril (PRINIVIL,ZESTRIL) tablet 20 mg  20 mg Oral Daily Baltazar ApoSamaya J Qureshi, MD  20 mg at 04/05/14 1413  . metoprolol succinate (TOPROL-XL) 24 hr tablet 50 mg  50 mg Oral Daily Baltazar Apo, MD   50 mg at 04/05/14 1444  . mometasone-formoterol (DULERA) 100-5 MCG/ACT inhaler 2 puff  2 puff Inhalation BID Baltazar Apo, MD      . rivaroxaban Carlena Hurl) tablet 20 mg  20 mg Oral Q supper Baltazar Apo, MD   20 mg at 04/05/14 1445  . sodium chloride 0.9 % injection 3 mL  3 mL Intravenous Q12H Baltazar Apo, MD   3 mL at 04/05/14 1315    Allergies: Allergies as of 04/05/2014  . (No Known Allergies)   Past Medical History  Diagnosis Date  . Hypertension   . Diabetes mellitus   . Asthma   . Osteoarthritis     left  shoulder  . Pseudogout   . Hypokalemia     a. Previously felt due to diuretics, req supplementation.  . Churg-Strauss syndrome     a. Sural nerve biopsy 02/2000.  Darene Lamer disease     a. s/p thyroidectomy.  . Glaucoma   . Hypercholesteremia   . Venous insufficiency   . Osteopenia     DEXA 2004  . Lipoma     left inner thigh  . Cataract     left eye  . VASCULITIS   . Chronic combined systolic and diastolic heart failure     a. 11/979 EF:20-25%  . Pleural effusion, left     a. Thoracentesis 2001 - per notes, no malignant cells, was transudative.  Marland Kitchen NSVT (nonsustained ventricular tachycardia)     a. During CHF adm 2001.  . Multifocal atrial tachycardia     a. Documented as OP 01/2012.  Marland Kitchen Ectopic atrial tachycardia     a. Documented on tele as IP 02/2012.  . Pulmonary HTN     a. Mod by echo 02/2012.  . Valvular heart disease     a. Echo 02/2012: mod MR/TR, mild AI.  Marland Kitchen Pericardial effusion     a. Echo 02/2012: small-mod pericardial effusion.  . Atrial fibrillation, permanent     a. Dx 04/2013, on metoprolol, dig and xarelto  . Shortness of breath    Past Surgical History  Procedure Laterality Date  . Abdominal hysterectomy    . Salpingoophorectomy    . Thyroidectomy    . Hemiarthroplasty hip Right 10/2010    cemented for hip fracture, femoral neck - by Eulas Post, MD  . Cataract extraction Right   . Hemiarthroplasty hip     Family History  Problem Relation Age of Onset  . Diabetes Mother   . Diabetes Father   . Heart disease Neg Hx    History   Social History  . Marital Status: Widowed    Spouse Name: N/A    Number of Children: N/A  . Years of Education: N/A   Occupational History  . Retired    Social History Main Topics  . Smoking status: Never Smoker   . Smokeless tobacco: Never Used  . Alcohol Use: No  . Drug Use: No  . Sexual Activity: Not on file   Other Topics Concern  . Not on file   Social History Narrative   Lives in house, with family  just next door.    Never smoker, no alcohol.      Used to work at [employer].    Before that, worked in food prep.  Review of Systems: General: Denies fever, chills, diaphoresis, fatigue, change in appetite. Respiratory: Admits to SOB, productive cough, hemoptysis, DOE, and wheezing.   Cardiovascular: Admits to intermittent chest pain and palpitations.  Gastrointestinal: Denies nausea, vomiting, abdominal pain, diarrhea, constipation and abdominal distention.  Genitourinary: Denies dysuria, urgency, frequency, hematuria, suprapubic pain and flank pain. Endocrine: Denies hot or cold intolerance, polyuria, and polydipsia. Musculoskeletal: Denies myalgias, back pain, joint swelling, arthralgias and gait problem.  Skin: Denies pallor, rash and wounds.  Neurological: Denies dizziness, headaches, weakness, lightheadedness, numbness, seizures, and syncope, Psychiatric/Behavioral: Denies mood changes, confusion, nervousness, sleep disturbance and agitation.  Physical Exam: Filed Vitals:   04/05/14 1200 04/05/14 1300 04/05/14 1332 04/05/14 1427  BP: 160/117  115/112 153/106  Pulse: 107  140 126  Temp:   97.8 F (36.6 C) 99.1 F (37.3 C)  TempSrc:   Oral Oral  Resp: 23  22 18   Height:  5\' 5"  (1.651 m) 5\' 5"  (1.651 m)   Weight:  156 lb 1.4 oz (70.8 kg) 159 lb 6.3 oz (72.3 kg)   SpO2: 94%  99% 100%   General: Vital signs reviewed.  Patient is an elderly female in mild acute distress and cooperative with exam.  Neck: Supple, trachea midline, normal ROM, 8-10 cm JVD, no carotid bruit present.  Cardiovascular: Irregularly irregular, tachycardic, 2/6 blowing holosystolic murmur heard best at the apex. Pulmonary/Chest: Diffuse moderate wheezes loudest in the mid lung region, no crackles or rhonchi.  Abdominal: Soft, non-tender, non-distended, BS +, no suprapubic tenderness, no masses, organomegaly, or guarding present.  Musculoskeletal: No joint deformities,  erythema, or stiffness, ROM full and nontender. Extremities: 2-3+ pitting edema in her left lower extremity extending up to her knee, 1-2+ pitting edema in her right lower extremity extending to her knee,  pulses symmetric and intact bilaterally. No cyanosis or clubbing. Neurological: AA&O x3, no focal motor deficit, sensory intact to light touch bilaterally.  Skin: Warm, dry and intact. No rashes or erythema. Psychiatric: Normal mood and affect. speech and behavior is normal. Cognition and memory are normal.   Lab results: Basic Metabolic Panel:  Recent Labs  41/63/84 0904 04/05/14 1140  NA 137 133*  K 4.0 >7.7*  CL 102 99  CO2  --  19  GLUCOSE 125* 111*  BUN 12 13  CREATININE 0.70 0.63  CALCIUM  --  8.8   Liver Function Tests:  Recent Labs  04/05/14 1140  AST 50*  ALT 8  ALKPHOS 86  BILITOT 1.4*  PROT 7.4  ALBUMIN 3.5   CBC:  Recent Labs  04/05/14 0839 04/05/14 0904  WBC 3.2*  --   NEUTROABS 1.6*  --   HGB 12.5 14.6  HCT 38.6 43.0  MCV 91.9  --   PLT 198  --    BNP:  Recent Labs  04/05/14 0855  PROBNP 4201.0*    Imaging results:  Dg Chest Port 1 View  04/05/2014   CLINICAL DATA:  Shortness of breath.  EXAM: PORTABLE CHEST - 1 VIEW  COMPARISON:  02/17/2014.  12/15/2013.  FINDINGS: Cardiomegaly with mild increase in pulmonary vascularity. Minimal interstitial prominence noted. Blunting of left costophrenic consistent with small effusion again noted. These findings suggest mild congestive heart failure. Bibasilar atelectasis. No pneumothorax. No acute bony abnormality.  IMPRESSION: 1. Severe cardiomegaly again noted. 2. Mild CHF.   Electronically Signed   By: Maisie Fus  Register   On: 04/05/2014 09:10    Other results: EKG: normal EKG, normal sinus rhythm, unchanged from previous  tracings, atrial fibrillation, rate 132.  Assessment & Plan by Problem:  Mild Asthma Exacerbation with concomitant combined CHF exacerbation: Patient presented with worsening  dyspnea, productive cough and hemoptysis over the past 2 days. Patient has a history of chronic combined systolic and diastolic congestive heart failure (Echo in 08/2013 showed EF 40-45% and grade 3 diastolic dyfunction, moderate to severe mitral and tricuspid regurgitation, and pulmonary HTN) and asthma. Patient's vital signs show she is afebrile at 98.1, hypertensive at 162/108, tachycardic at 122, tachypneic at 22, and pulse ox 91% on room air on admission. CXR showed severe cardiomegaly with small effusion, mild CHF and bibasilar atelectasis. Patient received furosemide 40 mg IV in the ED, along with an albuterol nebulizer, atrovent nebulizer x 2 and a levalbuterol nebulizer. At the time when we saw the patient, she stated she was feeling and breathing better. She did not have any accessory muscle use, but did have to pause during our conversation to catch her breath. She had audible and diffuse moderate expiratory wheezes on auscultation, but no rhonchi or crackles. Patient's symptoms sound most consistent with an asthma exacerbation. Patient complains of wheezing and dyspnea, nebulizer treatments have improved her symptoms. Her COPD exacerbation could have caused a mild CHF exacerbation as well. She denies increased orthopnea or increased weight gain or leg swelling, but she does have 1-2+ pitting edema in her right lower extremity and 2-3+ pitting edema in her left lower extremity and her CXR read has mild CHF with small effusion. However, patient does not appear volume overloaded and she does not have crackles on exam. We are treating her for both exacerbations and will monitor how her breathing improves. Symptoms are not likely due to a PE. Patient does not have calf tenderness and symptoms did not have a sudden onset. She has improved with breathing treatments. She denied an recent travel and states she is mobile and active at home. She has been taking her Xarelto as prescribed making DVT/PE less likely. It  is also unlikely that our patient has a pneumonia. She is afebrile without leukocytosis. CXR did not show any signs of consolidation. No rhonchi on exam. Later in the evening, patient was still wheezing, but her heart rate had come down. She felt improved from earlier in the ED. We gave her prednisone 60 mg once. Night time will watch urine output and can consider giving her lasix 20 mg IV overnight.  -Continue lisinopril 20 mg daily -Continue metoprolol XR 50 mg daily -Duoneb (ipratropium-albuterol) Q4H prn wheezing, shortness of breath -Dulera (mometasone-formoterol) 2 puffs BID -Repeat BMP in am -Cardiac monitoring -Daily weights -Cardiac Diet -I/Os -Orthostatic vital signs -Oxygen via [place] 2L -Trend troponins x 3 -Prednisone 60 mg po once  Chest Pain: Patient complains of chest pain that occurred once 2 days prior to admission and once one day prior to admission. Chest pain was a non-radiating pressure like sensation located to the left of her sternum, 6/10, that lasted for 5 minutes each time and resolved. Pain is not worse with palpation or inspiration and has not occurred today. The pain was not associated with an increase in shortness of breath, nausea or vomiting. She has had one negative poc troponin in the ED. EKG shows T wave inversion in leads aVL, V1, V5, and V6 similar to prior and non-specific questionable ST elevation in lead V3. Patient is not currently having chest pain. Chest pain is most likely due to recent onset of dyspnea and tachypnea. -Trend troponins x 3  -  Cardiac monitoring  Permanent Atrial Fibrillation: Patient is on metoprolol 50 mg daily and Xarelto 20 mg daily at home. On exam, patient is irregularly irregular and tachycardic. EKG shows atrial fibrillation with RVR, 132 bpm. Patient became less tachycardic later in the evening. Her tachycardia may have been exacerbated with nebulizer treatments. -Continue metoprolol 50 mg daily -Xarelto 20 mg daily  DM type II:  Patient has a history of DM type II. Patient is on metformin 1 gram BID. She states her glucose is well controlled at home, 110s to 120s. Her most recent HgbA1C was 6.7 on 11/24/13. -SSI-sensitive -CBG checks 4 times daily -Cardiac diet  Graves disease s/p thyroidectomy: Patient has a history of Graves disease s/p thyroidectomy. Her most recent TSH was normal at 1.410 on 11/24/13. It appears her TSH levels have been WNL since 2008. -Stable  HLD: Most recent lipid panel showed cholesterol 132, TG 74, HDL 54, and LDL 63. Patient is not currently on a statin at home.  -Recheck lipid panel  DVT/PE ppx: Xarelto 20 mg daily  Dispo: Disposition is deferred at this time, awaiting improvement of current medical problems. Anticipated discharge in approximately 1-2 day(s).   The patient does have a current PCP Yolanda Manges, DO) and does need an [facility] hospital follow-up appointment after discharge.  The patient does not have transportation limitations that hinder transportation to clinic appointments.  Signed: Jill Alexanders, DO PGY-1 Internal Medicine Resident Pager # [phone] 04/05/2014 3:09 PM

## 2014-04-05 NOTE — ED Notes (Signed)
Attempted report 

## 2014-04-05 NOTE — ED Provider Notes (Signed)
CSN: [id number]     Arrival date & time 04/05/14  0808 History   First MD Initiated Contact with Patient 04/05/14 0831     Chief Complaint  Patient presents with  . Shortness of Breath     (Consider location/radiation/quality/duration/timing/severity/associated sxs/prior Treatment) Patient is a 80 y.o. female presenting with shortness of breath. The history is provided by the patient.  Shortness of Breath Severity:  Moderate Associated symptoms: cough   Associated symptoms: no abdominal pain, no chest pain, no headaches, no rash and no vomiting    patient's had shortness of breath over the last 2 days. Worse with exertion. She's had occasional cough. No chest pain. She's has occasional blood in her sputum and also does some sputum production. No chest pain. She states her legs are more swollen than her baseline. She states it feels as if her asthma is flaring up. She also states she feels that she's getting more fluid.  Past Medical History  Diagnosis Date  . Hypertension   . Diabetes mellitus   . Asthma   . Osteoarthritis     left shoulder  . Pseudogout   . Hypokalemia     a. Previously felt due to diuretics, req supplementation.  . Churg-Strauss syndrome     a. Sural nerve biopsy 02/2000.  Darene Lamer disease     a. s/p thyroidectomy.  . Glaucoma   . Hypercholesteremia   . Venous insufficiency   . Osteopenia     DEXA 2004  . Lipoma     left inner thigh  . Cataract     left eye  . VASCULITIS   . Chronic combined systolic and diastolic heart failure     a. 0/1601 EF:20-25%  . Pleural effusion, left     a. Thoracentesis 2001 - per notes, no malignant cells, was transudative.  Marland Kitchen NSVT (nonsustained ventricular tachycardia)     a. During CHF adm 2001.  . Multifocal atrial tachycardia     a. Documented as OP 01/2012.  Marland Kitchen Ectopic atrial tachycardia     a. Documented on tele as IP 02/2012.  . Pulmonary HTN     a. Mod by echo 02/2012.  . Valvular heart disease     a. Echo  02/2012: mod MR/TR, mild AI.  Marland Kitchen Pericardial effusion     a. Echo 02/2012: small-mod pericardial effusion.  . Atrial fibrillation, permanent     a. Dx 04/2013, on metoprolol, dig and xarelto  . Shortness of breath    Past Surgical History  Procedure Laterality Date  . Abdominal hysterectomy    . Salpingoophorectomy    . Thyroidectomy    . Hemiarthroplasty hip Right 10/2010    cemented for hip fracture, femoral neck - by Eulas Post, MD  . Cataract extraction Right   . Hemiarthroplasty hip     Family History  Problem Relation Age of Onset  . Diabetes Mother   . Diabetes Father   . Heart disease Neg Hx    History  Substance Use Topics  . Smoking status: Never Smoker   . Smokeless tobacco: Never Used  . Alcohol Use: No   OB History   Grav Para Term Preterm Abortions TAB SAB Ect Mult Living                 Review of Systems  Constitutional: Negative for activity change and appetite change.  Eyes: Negative for pain.  Respiratory: Positive for cough and shortness of breath. Negative for chest tightness.  Cardiovascular: Positive for leg swelling. Negative for chest pain.  Gastrointestinal: Negative for nausea, vomiting, abdominal pain and diarrhea.  Genitourinary: Negative for flank pain.  Musculoskeletal: Negative for back pain and neck stiffness.  Skin: Negative for rash.  Neurological: Negative for weakness, numbness and headaches.  Psychiatric/Behavioral: Negative for behavioral problems.      Allergies  Review of patient's allergies indicates no known allergies.  Home Medications   Prior to Admission medications   Medication Sig Start Date End Date Taking? Authorizing Provider  albuterol (PROVENTIL HFA;VENTOLIN HFA) 108 (90 BASE) MCG/ACT inhaler Inhale 1-2 puffs into the lungs every 6 (six) hours as needed for shortness of breath. 06/14/13  Yes Rocco SereneLawrence D Klima, MD  brimonidine (ALPHAGAN P) 0.1 % SOLN Place 1 drop into the left eye every 12 (twelve) hours.  05/27/12  Yes Lars MageAnkit Garg, MD  dorzolamide (TRUSOPT) 2 % ophthalmic solution Place 1 drop into the left eye 2 (two) times daily.    Yes Historical Provider, MD  Fluticasone-Salmeterol (ADVAIR) 250-50 MCG/DOSE AEPB Inhale 1 puff into the lungs 2 (two) times daily. 06/15/13  Yes Rocco SereneLawrence D Klima, MD  furosemide (LASIX) 40 MG tablet Take 1 tablet (40 mg total) by mouth 2 (two) times daily. 02/24/14  Yes Alex Elson ClanM Wilson, DO  lisinopril (PRINIVIL,ZESTRIL) 20 MG tablet Take 1 tablet (20 mg total) by mouth daily. 12/29/13  Yes Courtney ParisEden W Jones, MD  metFORMIN (GLUCOPHAGE) 1000 MG tablet Take 1 tablet (1,000 mg total) by mouth 2 (two) times daily with a meal. 02/24/14  Yes Yolanda MangesAlex M Wilson, DO  metoprolol succinate (TOPROL-XL) 50 MG 24 hr tablet Take 1 tablet (50 mg total) by mouth daily. Take with or immediately following a meal. 02/24/14  Yes Yolanda MangesAlex M Wilson, DO  potassium chloride SA (K-DUR,KLOR-CON) 20 MEQ tablet Take 2 tablets (40 mEq total) by mouth daily. 02/24/14  Yes Yolanda MangesAlex M Wilson, DO  rivaroxaban (XARELTO) 20 MG TABS tablet Take 1 tablet (20 mg total) by mouth every morning. 02/24/14  Yes Alex Elson ClanM Wilson, DO   BP 105/65  Pulse 90  Temp(Src) 98.2 F (36.8 C) (Oral)  Resp 18  Ht 5\' 5"  (1.651 m)  Wt 159 lb 6.3 oz (72.3 kg)  BMI 26.52 kg/m2  SpO2 94% Physical Exam  Nursing note and vitals reviewed. Constitutional: She is oriented to person, place, and time. She appears well-developed and well-nourished.  HENT:  Head: Normocephalic and atraumatic.  Eyes: EOM are normal. Pupils are equal, round, and reactive to light.  Neck: Normal range of motion. Neck supple.  Cardiovascular: Normal heart sounds.   No murmur heard. Irregular tachycardia  Pulmonary/Chest: No respiratory distress. She has no wheezes. She has no rales.  Forced upper airway wheezes. Rales bilateral bases. Some tachypnea.  Abdominal: Soft. Bowel sounds are normal. She exhibits no distension. There is no tenderness. There is no rebound and no  guarding.  Musculoskeletal: Normal range of motion. She exhibits edema.  Bilateral lower extremity pitting edema, worse om the left.  Neurological: She is alert and oriented to person, place, and time. No cranial nerve deficit.  Skin: Skin is warm and dry.  Psychiatric: She has a normal mood and affect. Her speech is normal.    ED Course  Procedures (including critical care time) Labs Review Labs Reviewed  CBC WITH DIFFERENTIAL - Abnormal; Notable for the following:    WBC 3.2 (*)    Neutro Abs 1.6 (*)    Monocytes Relative 20 (*)    Basophils Relative  2 (*)    All other components within normal limits  PRO B NATRIURETIC PEPTIDE - Abnormal; Notable for the following:    Pro B Natriuretic peptide (BNP) 4201.0 (*)    All other components within normal limits  COMPREHENSIVE METABOLIC PANEL - Abnormal; Notable for the following:    Sodium 133 (*)    Potassium >7.7 (*)    Glucose, Bld 111 (*)    AST 50 (*)    Total Bilirubin 1.4 (*)    GFR calc non Af Amer 79 (*)    All other components within normal limits  HEMOGLOBIN A1C - Abnormal; Notable for the following:    Hemoglobin A1C 7.9 (*)    Mean Plasma Glucose 180 (*)    All other components within normal limits  BASIC METABOLIC PANEL - Abnormal; Notable for the following:    Glucose, Bld 126 (*)    GFR calc non Af Amer 77 (*)    GFR calc Af Amer 89 (*)    Anion gap 16 (*)    All other components within normal limits  GLUCOSE, CAPILLARY - Abnormal; Notable for the following:    Glucose-Capillary 214 (*)    All other components within normal limits  I-STAT CHEM 8, ED - Abnormal; Notable for the following:    Glucose, Bld 125 (*)    Calcium, Ion 1.06 (*)    All other components within normal limits  TROPONIN I  TROPONIN I  TROPONIN I  BASIC METABOLIC PANEL  LACTIC ACID, PLASMA  I-STAT TROPOININ, ED    Imaging Review Dg Chest Port 1 View  04/05/2014   CLINICAL DATA:  Shortness of breath.  EXAM: PORTABLE CHEST - 1 VIEW   COMPARISON:  02/17/2014.  12/15/2013.  FINDINGS: Cardiomegaly with mild increase in pulmonary vascularity. Minimal interstitial prominence noted. Blunting of left costophrenic consistent with small effusion again noted. These findings suggest mild congestive heart failure. Bibasilar atelectasis. No pneumothorax. No acute bony abnormality.  IMPRESSION: 1. Severe cardiomegaly again noted. 2. Mild CHF.   Electronically Signed   By: Maisie Fus  Register   On: 04/05/2014 09:10     EKG Interpretation   Date/Time:  Wednesday April 05 2014 08:21:50 EDT Ventricular Rate:  132 PR Interval:    QRS Duration: 97 QT Interval:  343 QTC Calculation: 508 R Axis:   -82 Text Interpretation:  Atrial fibrillation Inferior infarct, old Anterior  infarct, old Prolonged QT interval Baseline wander in lead(s) V6 Premature  ventricular complexes Confirmed by Rubin Payor  MD, Harrold Donath [id number]) on  04/05/2014 8:48:18 AM      MDM   Final diagnoses:  Acute on chronic congestive heart failure, unspecified congestive heart failure type  Chronic obstructive pulmonary disease, unspecified COPD, unspecified chronic bronchitis type  Atrial fibrillation with RVR    Chf. Hypoxic on room air. Likely copd component also. Admit to PCP    Juliet Rude. Rubin Payor, MD 04/05/14 2128

## 2014-04-05 NOTE — ED Notes (Addendum)
Lab called to notify this RN of critical K >7.7. Results hemolyzed. New orders placed per protocol. Initial K 4. Md paged and patient taken to floor on cardiac monitor in stable condition.

## 2014-04-05 NOTE — Progress Notes (Signed)
Utilization Review Completed.[name] T8/19/2015  

## 2014-04-05 NOTE — ED Notes (Signed)
Pt arrives via POV from home with SOB that began last night. Pt with hx of asthma. Pt with labored respirations, audible upper airway wheezes. Diminished in bases. Also c/o chest tightness. Pt also with CHF hx.

## 2014-04-05 NOTE — Progress Notes (Signed)
Heart rate did decrease, and is now in the 3' and 90's.

## 2014-04-05 NOTE — Progress Notes (Signed)
Spoke with MD Evert Kohl about patients bp and heart rate. I had just given Lisinopril and Motoprolol which were schedule. Blood pressure was elevated when she came up from ER and heart rate continued to alarm in the 140's.  MD did not give any new orders. Was told to see how the heart rate did with the motoprolol, and monitor the urinary intake ans output closely.

## 2014-04-05 NOTE — ED Notes (Signed)
IV RN at bedside 

## 2014-04-05 NOTE — ED Notes (Signed)
Admitting team at bedside with patient.

## 2014-04-05 NOTE — ED Notes (Signed)
IV team aware, unsucessful attempt by this RN x3.

## 2014-04-06 DIAGNOSIS — E0789 Other specified disorders of thyroid: Secondary | ICD-10-CM

## 2014-04-06 DIAGNOSIS — I4891 Unspecified atrial fibrillation: Secondary | ICD-10-CM

## 2014-04-06 DIAGNOSIS — I5040 Unspecified combined systolic (congestive) and diastolic (congestive) heart failure: Secondary | ICD-10-CM

## 2014-04-06 DIAGNOSIS — E119 Type 2 diabetes mellitus without complications: Secondary | ICD-10-CM

## 2014-04-06 DIAGNOSIS — R079 Chest pain, unspecified: Secondary | ICD-10-CM

## 2014-04-06 DIAGNOSIS — E785 Hyperlipidemia, unspecified: Secondary | ICD-10-CM

## 2014-04-06 DIAGNOSIS — J45901 Unspecified asthma with (acute) exacerbation: Secondary | ICD-10-CM

## 2014-04-06 DIAGNOSIS — E0500 Thyrotoxicosis with diffuse goiter without thyrotoxic crisis or storm: Secondary | ICD-10-CM

## 2014-04-06 LAB — BASIC METABOLIC PANEL
Anion gap: 11 (ref 5–15)
BUN: 11 mg/dL (ref 6–23)
CO2: 29 mEq/L (ref 19–32)
Calcium: 9.1 mg/dL (ref 8.4–10.5)
Chloride: 100 mEq/L (ref 96–112)
Creatinine, Ser: 0.74 mg/dL (ref 0.50–1.10)
GFR, EST AFRICAN AMERICAN: 87 mL/min — AB (ref >90)
GFR, EST NON AFRICAN AMERICAN: 75 mL/min — AB (ref >90)
Glucose, Bld: 91 mg/dL (ref 70–99)
POTASSIUM: 3.4 meq/L — AB (ref 3.7–5.3)
SODIUM: 140 meq/L (ref 137–147)

## 2014-04-06 LAB — GLUCOSE, CAPILLARY
GLUCOSE-CAPILLARY: 224 mg/dL — AB (ref 70–99)
Glucose-Capillary: 117 mg/dL — ABNORMAL HIGH (ref 70–99)
Glucose-Capillary: 96 mg/dL (ref 70–99)
Glucose-Capillary: 144 mg/dL — ABNORMAL HIGH (ref 70–99)
Glucose-Capillary: 294 mg/dL — ABNORMAL HIGH (ref 70–99)

## 2014-04-06 LAB — LIPID PANEL
CHOL/HDL RATIO: 2.4 ratio
CHOLESTEROL: 129 mg/dL (ref 0–200)
HDL: 54 mg/dL (ref >39)
LDL Cholesterol: 65 mg/dL (ref 0–99)
Triglycerides: 52 mg/dL (ref <150)
VLDL: 10 mg/dL (ref 0–40)

## 2014-04-06 LAB — LACTIC ACID, PLASMA: LACTIC ACID, VENOUS: 1 mmol/L (ref 0.5–2.2)

## 2014-04-06 LAB — TROPONIN I: Troponin I: <0.3 ng/mL (ref <0.30)

## 2014-04-06 MED ORDER — METHYLPREDNISOLONE SODIUM SUCC 125 MG IJ SOLR
60.0000 mg | Freq: Once | INTRAMUSCULAR | Status: AC
  Administered 2014-04-06: 60 mg via INTRAVENOUS
  Filled 2014-04-06: qty 0.96

## 2014-04-06 MED ORDER — GLUCERNA SHAKE PO LIQD
237.0000 mL | ORAL | Status: DC
  Administered 2014-04-06 – 2014-04-13 (×8): 237 mL via ORAL

## 2014-04-06 MED ORDER — METHYLPREDNISOLONE SODIUM SUCC 125 MG IJ SOLR
60.0000 mg | Freq: Two times a day (BID) | INTRAMUSCULAR | Status: DC
  Administered 2014-04-07: 60 mg via INTRAVENOUS
  Filled 2014-04-06 (×3): qty 0.96

## 2014-04-06 MED ORDER — PREDNISONE 50 MG PO TABS
60.0000 mg | ORAL_TABLET | Freq: Once | ORAL | Status: DC
  Filled 2014-04-06: qty 1

## 2014-04-06 MED ORDER — PREDNISONE 50 MG PO TABS
60.0000 mg | ORAL_TABLET | Freq: Every day | ORAL | Status: DC
  Filled 2014-04-06: qty 1

## 2014-04-06 MED ORDER — METHYLPREDNISOLONE SODIUM SUCC 125 MG IJ SOLR
60.0000 mg | Freq: Two times a day (BID) | INTRAMUSCULAR | Status: DC
  Administered 2014-04-06: 60 mg via INTRAVENOUS
  Filled 2014-04-06 (×2): qty 0.96

## 2014-04-06 MED ORDER — ADULT MULTIVITAMIN W/MINERALS CH
1.0000 | ORAL_TABLET | Freq: Every day | ORAL | Status: DC
  Administered 2014-04-07 – 2014-04-13 (×7): 1 via ORAL
  Filled 2014-04-06 (×7): qty 1

## 2014-04-06 MED ORDER — PREDNISONE 50 MG PO TABS
60.0000 mg | ORAL_TABLET | Freq: Once | ORAL | Status: AC
  Administered 2014-04-06: 60 mg via ORAL
  Filled 2014-04-06: qty 1

## 2014-04-06 MED ORDER — POTASSIUM CHLORIDE CRYS ER 20 MEQ PO TBCR
40.0000 meq | EXTENDED_RELEASE_TABLET | Freq: Once | ORAL | Status: AC
  Administered 2014-04-06: 40 meq via ORAL
  Filled 2014-04-06: qty 2

## 2014-04-06 NOTE — Progress Notes (Signed)
  I have seen and examined the patient, and reviewed the daily progress note by Staci Righter, MS III and discussed the care of the patient with them. Please see my progress note from 04/06/2014 for further details regarding assessment and plan.    Signed:  Jill Alexanders, DO PGY-1 Internal Medicine Resident Pager # [phone] 04/06/2014 10:03 AM

## 2014-04-06 NOTE — Progress Notes (Signed)
Spoke with patient at bedside and grand daughter Leavy Cella who is her day time caregiver.  They agree that [facility] services are not needed at this time.  Services will be discontinued.  Family has contact information to contact our office as needed. Of note, [facility] Care Management services does not replace or interfere with any services that are arranged by inpatient case management or social work.  For additional questions or referrals please contact Anibal Henderson BSN RN [facility] Liaison at [phone].

## 2014-04-06 NOTE — Progress Notes (Signed)
INITIAL NUTRITION ASSESSMENT  DOCUMENTATION CODES Per approved criteria  -Not Applicable   INTERVENTION: Provide Glucerna Shake once daily Provide Multivitamin with minerals daily Encourage PO intake  NUTRITION DIAGNOSIS: Predicted sub optimal energy intake related to decreased appetite PTA as evidenced by pt's report and use of supplements daily PTA to maintain weight.   Goal: Pt to meet >/= 90% of their estimated nutrition needs   Monitor:  PO intake, weight trend, labs, I/O's  Reason for Assessment: Malnutrition Screening Tool (MST) score of 3  80 y.o. female  Admitting Dx: Asthma with acute exacerbation in adult  ASSESSMENT: 80 yo female with PMHx of Chronic combined systolic and diastolic congestive heart failure, Asthma, Graves disease s/p thyroidectomy, HLD, permanent atrial fibrillation on metoprolol and Xarelto, and DM type II who presents to the ED with complaint of worsening shortness of breath and cough. Patient states she developed shortness of breath and a productive cough with yellow-white sputum 2 days ago. She developed audible wheezing and decided to come into the ED.  Per MD note, her appetite is improving. Per nursing notes, pt consumed 100% of breakfast this morning, 25% of meals yesterday. Pt states that she has had a decreased appetite for the past week but, it is much better today. She reports that she has been maintaining her weight at 155 lbs and she usually drinks an Ensure shake once daily to help her maintain her weight.  Labs reviewed: low potassium, Hemoglobin A1C elevated at 7.9%  Nutrition Focused Physical Exam:  Subcutaneous Fat:  Orbital Region: wnl Upper Arm Region: mild wasting Thoracic and Lumbar Region: wnl  Muscle:  Temple Region: mild wasting Clavicle Bone Region: moderate wasting Clavicle and Acromion Bone Region: mild wasting Scapular Bone Region: NA Dorsal Hand: wnl Patellar Region: edema Anterior Thigh Region: mild  wasting Posterior Calf Region: edema  Edema: +1 RLE and LLE edema   Height: Ht Readings from Last 1 Encounters:  04/05/14 5\' 5"  (1.651 m)    Weight: Wt Readings from Last 1 Encounters:  04/06/14 156 lb 14.4 oz (71.169 kg)    Ideal Body Weight: 125 lbs  % Ideal Body Weight: 125%  Wt Readings from Last 10 Encounters:  04/06/14 156 lb 14.4 oz (71.169 kg)  02/24/14 156 lb 1.6 oz (70.806 kg)  12/29/13 152 lb 3.2 oz (69.037 kg)  12/18/13 152 lb 6.4 oz (69.128 kg)  11/24/13 159 lb 1.6 oz (72.167 kg)  09/07/13 139 lb (63.05 kg)  08/23/13 151 lb 10.8 oz (68.8 kg)  07/18/13 158 lb 1.6 oz (71.714 kg)  05/26/13 166 lb 12.8 oz (75.66 kg)  05/19/13 165 lb 2 oz (74.9 kg)    Usual Body Weight: 155 lbs  % Usual Body Weight: 100%  BMI:  Body mass index is 26.11 kg/(m^2).  Estimated Nutritional Needs: Kcal: 1700-1900 Protein: 80-90 grams Fluid: 1.7-1.9 L/day  Skin: intact; +1 RLE and LLE edema  Diet Order:  Heart Healthy/ Carb Modified Diet  EDUCATION NEEDS: -No education needs identified at this time   Intake/Output Summary (Last 24 hours) at 04/06/14 1001 Last data filed at 04/06/14 0900  Gross per 24 hour  Intake    740 ml  Output   1170 ml  Net   -430 ml    Last BM: 8/19  Labs:   Recent Labs Lab 04/05/14 1140 04/05/14 1241 04/06/14 0500  NA 133* 139 140  K >7.7* 4.0 3.4*  CL 99 99 100  CO2 19 24 29   BUN 13 12  11  CREATININE 0.63 0.69 0.74  CALCIUM 8.8 9.2 9.1  GLUCOSE 111* 126* 91    CBG (last 3)   Recent Labs  04/05/14 2024 04/06/14 0421 04/06/14 0544  GLUCAP 247* 117* 96    Scheduled Meds: . antiseptic oral rinse  7 mL Mouth Rinse BID  . brimonidine  1 drop Left Eye BID  . dorzolamide  1 drop Left Eye BID  . furosemide  40 mg Intravenous BID  . insulin aspart  0-9 Units Subcutaneous TID WC  . lisinopril  20 mg Oral Daily  . metoprolol succinate  50 mg Oral Daily  . mometasone-formoterol  2 puff Inhalation BID  . [START ON  04/07/2014] predniSONE  60 mg Oral Q breakfast  . rivaroxaban  20 mg Oral Q supper  . sodium chloride  3 mL Intravenous Q12H    Continuous Infusions:   Past Medical History  Diagnosis Date  . Hypertension   . Diabetes mellitus   . Asthma   . Osteoarthritis     left shoulder  . Pseudogout   . Hypokalemia     a. Previously felt due to diuretics, req supplementation.  . Churg-Strauss syndrome     a. Sural nerve biopsy 02/2000.  Darene Lamer disease     a. s/p thyroidectomy.  . Glaucoma   . Hypercholesteremia   . Venous insufficiency   . Osteopenia     DEXA 2004  . Lipoma     left inner thigh  . Cataract     left eye  . VASCULITIS   . Chronic combined systolic and diastolic heart failure     a. 10/8099 EF:20-25%  . Pleural effusion, left     a. Thoracentesis 2001 - per notes, no malignant cells, was transudative.  Marland Kitchen NSVT (nonsustained ventricular tachycardia)     a. During CHF adm 2001.  . Multifocal atrial tachycardia     a. Documented as OP 01/2012.  Marland Kitchen Ectopic atrial tachycardia     a. Documented on tele as IP 02/2012.  . Pulmonary HTN     a. Mod by echo 02/2012.  . Valvular heart disease     a. Echo 02/2012: mod MR/TR, mild AI.  Marland Kitchen Pericardial effusion     a. Echo 02/2012: small-mod pericardial effusion.  . Atrial fibrillation, permanent     a. Dx 04/2013, on metoprolol, dig and xarelto  . Shortness of breath     Past Surgical History  Procedure Laterality Date  . Abdominal hysterectomy    . Salpingoophorectomy    . Thyroidectomy    . Hemiarthroplasty hip Right 10/2010    cemented for hip fracture, femoral neck - by Eulas Post, MD  . Cataract extraction Right   . Hemiarthroplasty hip      Ian Malkin RD, LDN Inpatient Clinical Dietitian Pager: [phone] After Hours Pager: [phone]

## 2014-04-06 NOTE — Clinical Documentation Improvement (Signed)
Pt admitted for asthma and CHF. Based on the record, the patient has the following clinical indicators:   -Use of accessory muscles documented in progress note on 8/20  -8/20 progress note stated speaking in full sentences but needing to take a pause to catch breath -8/19 and 8/20 notes mention tachypnea -tachypnea  -SOB and dyspnea -O2 via [place] 2-4L -Nebulizer treatments and lasix -wheezing noted on auscultation   Based on the above clinical indicators, please clarify if the patient qualifies for one of the diagnoses listed below in setting of CHF and asthma and document in progress note or discharge summary.    Possible Conditions:  Acute Respiratory Failure  Acute on Chronic Respiratory Failure  Chronic Respiratory Failure  Acute Respiratory Insufficiency  Other Condition  Cannot Clinically Determine     Treatment:  furosemide (LASIX) injection 40 mg    predniSONE (DELTASONE) tablet 60 mg    levalbuterol (XOPENEX) nebulizer solution 0.63 mg    ipratropium-albuterol (DUONEB) 0.5-2.5 (3) ipratropium (ATROVENT) nebulizer solution 0.5 mg    albuterol (PROVENTIL) (2.5 MG/3ML) 0.083% nebulizer  .     Thank you, Doy Mince, RN [phone] Clinical Documentation Specialist

## 2014-04-06 NOTE — Progress Notes (Signed)
Subjective: [name] is an 80 YO woman with a PMH of asthma, CHF, A fib, DM2, and HLD who presented to the ED yesterday with SOB and two brief episodes of chest pain. Overnight, the patient reports that her SOB has improved slightly. Her O2 sat was 100% on 2L [place]. She was able to sleep through the night and had no acute overnight events. She denies any chest pain, fever, cough, nausea, or vomiting. Her appetite is improving and she is eating a normal diet.   Objective: Vital signs in last 24 hours: Filed Vitals:   04/05/14 2009 04/06/14 0218 04/06/14 0426 04/06/14 0619  BP: 105/65 155/87 159/63   Pulse: 90 92 71   Temp: 98.2 F (36.8 C) 98.3 F (36.8 C) 98.2 F (36.8 C)   TempSrc: Oral Oral Oral   Resp: 18 20 20    Height:      Weight:    71.169 kg (156 lb 14.4 oz)  SpO2: 94%  100%    Weight change:   Intake/Output Summary (Last 24 hours) at 04/06/14 0836 Last data filed at 04/06/14 0428  Gross per 24 hour  Intake    500 ml  Output    870 ml  Net   -370 ml   BP 159/63  Pulse 71  Temp(Src) 98.2 F (36.8 C) (Oral)  Resp 20  Ht 5\' 5"  (1.651 m)  Wt 71.169 kg (156 lb 14.4 oz)  BMI 26.11 kg/m2  SpO2 100% General appearance: alert and cooperative Head: Normocephalic, without obvious abnormality, atraumatic Lungs: wheezes bilaterally Heart: irregularly irregular rhythm and 2/6 blowing holosystolic murmur best heard at the apex Abdomen: soft, non-tender; bowel sounds normal; no masses,  no organomegaly Extremities: edema 1+ bilaterally to the mid-shin Skin: Skin color, texture, turgor normal. No rashes or lesions  Lab Results: Basic Metabolic Panel:  Recent Labs Lab 04/05/14 1241 04/06/14 0500  NA 139 140  K 4.0 3.4*  CL 99 100  CO2 24 29  GLUCOSE 126* 91  BUN 12 11  CREATININE 0.69 0.74  CALCIUM 9.2 9.1    Recent Labs Lab 04/05/14 0839 04/05/14 0904  WBC 3.2*  --   NEUTROABS 1.6*  --   HGB 12.5 14.6  HCT 38.6 43.0  MCV 91.9  --   PLT 198  --     Cardiac Enzymes:  Recent Labs Lab 04/05/14 1359 04/05/14 1600 04/05/14 2300  TROPONINI <0.30 <0.30 <0.30   BNP:  Recent Labs Lab 04/05/14 0855  PROBNP 4201.0*   CBG:  Recent Labs Lab 04/05/14 1556 04/05/14 2024 04/06/14 0421 04/06/14 0544  GLUCAP 214* 247* 117* 96   Hemoglobin A1C:  Recent Labs Lab 04/05/14 1140  HGBA1C 7.9*   Studies/Results: Dg Chest Port 1 View  04/05/2014   CLINICAL DATA:  Shortness of breath.  EXAM: PORTABLE CHEST - 1 VIEW  COMPARISON:  02/17/2014.  12/15/2013.  FINDINGS: Cardiomegaly with mild increase in pulmonary vascularity. Minimal interstitial prominence noted. Blunting of left costophrenic consistent with small effusion again noted. These findings suggest mild congestive heart failure. Bibasilar atelectasis. No pneumothorax. No acute bony abnormality.  IMPRESSION: 1. Severe cardiomegaly again noted. 2. Mild CHF.   Electronically Signed   By: Maisie Fus  Register   On: 04/05/2014 09:10   Medications: I have reviewed the patient's current medications. Scheduled Meds: . antiseptic oral rinse  7 mL Mouth Rinse BID  . brimonidine  1 drop Left Eye BID  . dorzolamide  1 drop Left Eye BID  . furosemide  40 mg Intravenous BID  . insulin aspart  0-9 Units Subcutaneous TID WC  . lisinopril  20 mg Oral Daily  . metoprolol succinate  50 mg Oral Daily  . mometasone-formoterol  2 puff Inhalation BID  . potassium chloride  40 mEq Oral Once  . [START ON 04/07/2014] predniSONE  60 mg Oral Q breakfast  . rivaroxaban  20 mg Oral Q supper  . sodium chloride  3 mL Intravenous Q12H   Continuous Infusions:  Lasix 40 mg BID IV PRN Meds:.acetaminophen, acetaminophen, ipratropium-albuterol  Assessment/Plan: Principal Problem:   Asthma with acute exacerbation in adult Active Problems:   DIABETES MELLITUS, TYPE II   HYPERTENSION   Acute on chronic combined systolic and diastolic congestive heart failure   Atrial fibrillation   Valvular heart disease    Severe protein-calorie malnutrition   Dyspnea   Current use of long term anticoagulation--xarelto  Assessment & Plan by Problem:  Mild Asthma Exacerbation with concomitant combined CHF exacerbation: Patient presented with worsening dyspnea, productive cough and hemoptysis over the past 2 days. CXR revealed cardiomegaly with mild increase in pulmonary vascularity. The L costophrenic angle was blunted with a small effusion noted, suggestive of mild CHF. Her O2 sats have been stable, ranging from 94-100% on 2L [place]. We will continue to monitor her urine output closely to ensure that she won't become volume depleted with the Lasix treatment. Her K+ was 3.4, so 40 mg of K-Dur was ordered. To better control her wheezing and inflammation, prednisone 60 mg will be started for 5 days. Serial troponins were negative x3.  -Start prednisone 60 mg po x 5 days -Admin 40 mg K-Dur -Lasix 40 mg BID IV -Continue lisinopril 20 mg daily  -Continue metoprolol XR 50 mg daily  -Duoneb (ipratropium-albuterol) Q4H prn wheezing, shortness of breath  -Dulera (mometasone-formoterol) 2 puffs BID  -Cardiac monitoring  -Daily weights  -Cardiac Diet  -I/Os  -Orthostatic vital signs  -Oxygen via [place] 2L  -Troponins negative x 3  -Prednisone 60 mg po beginning 8/20 for 5 days   Chest Pain: Patient complains of chest pain that occurred once 2 days prior to admission and once one day prior to admission. EKG shows T wave inversion in leads aVL, V1, V5, and V6 similar to prior and non-specific questionable ST elevation in lead V3. Patient has not had additional episodes of chest pain since hospitalization. Chest pain is most likely due to recent onset of dyspnea and tachypnea.  -Troponins negative x 3  -Cardiac monitoring   Permanent Atrial Fibrillation: Patient is on metoprolol 50 mg daily and Xarelto 20 mg daily at home. On exam, patient has an irregularly irregular rhythm. EKG shows atrial fibrillation with RVR, 132 bpm. Patient  became less tachycardic later in the evening after metoprolol was resumed. Tachycardia likely due to nebulizer treatments. -Continue metoprolol 50 mg daily  -Xarelto 20 mg daily   DM type II: Patient has a history of DM type II. Patient is on metformin 1 gram BID. She states her glucose is well controlled at home, 110s to 120s. Her most recent HgbA1C was 6.7 on 11/24/13.  -SSI-sensitive  -CBG checks 4 times daily  -Cardiac diet   Graves disease s/p thyroidectomy: Patient has a history of Graves disease s/p thyroidectomy. Her most recent TSH was normal at 1.410 on 11/24/13. It appears her TSH levels have been WNL since 2008.  -Stable   HLD: Most recent lipid panel showed cholesterol 132, TG 74, HDL 54, and LDL 63. Patient  is not currently on a statin at home.  -Lipid panel ordered  DVT/PE ppx: Xarelto 20 mg daily   Dispo: Disposition is deferred at this time, awaiting improvement of wheezing. Anticipated discharge in approximately 1-2 day(s).   The patient does have a current PCP Yolanda Manges, DO) and does need an [facility] hospital follow-up appointment after discharge.   The patient does not have transportation limitations that hinder transportation to clinic appointments.  This is a Psychologist, occupational Note.  The care of the patient was discussed with Dr. Senaida Ores and the assessment and plan formulated with their assistance.  Please see their attached note for official documentation of the daily encounter.   LOS: 1 day   Alford Highland, Med Student 04/06/2014, 8:36 AM

## 2014-04-06 NOTE — H&P (Signed)
INTERNAL MEDICINE TEACHING ATTENDING NOTE  Day 1 of stay  Patient name: [name]  MRN: [id number] Date of birth: Feb 23, 1926   Key clinical points and exam                                                           80 y.o.african [place] female with asthma and combined heart failure, admitted with shortness of breath, thought to be secondary to a mixed asthma exacerbation and CHF exacerbation picture. She denies fever, chills, cough and her CXR does not show any infiltrate. She denies any recent weight gain.   Her vitals are stable. I met with her this morning and she appeared sick to me. She seemed to have difficulty sitting up from lying position in bed. She was speaking in full sentences, but did appear to take a pause every now and then to catch her breath. Expiratory wheezing was audible at times. Alert and oriented and with good comprehension. Lungs - expiratory wheezing all lung fields, no rales, moving air. Heart - irregularly irregular rhythm, systolic murmur, no gallop. Abdomen - soft, non-tender, benign. Moderate pedal edema, pitting upto knees, chronically present. Pedal pulses present, no wounds on feet. Statis dermatitis changes apparent.   I have reviewed the chart, lab results, EKG, imaging and relevant notes of this patient.    Assessment and Plan                                                                       Asthma Exacerbation - Doubt any infectious process going on. I would increase her steroids to solumedrol 60 q12. Reassess tomorrow. Continue duonebs and other supportive treatment.   CHF exacerbation - Likely mild. On Lasix 40 mg BID, although the patient is net negative for 600 ml for fluid since yesterday, and her kidney function is within normal limits. However, weight loss by 3 lbs noted since yesterday. We continue this dose of lasix till tomorrow and reassess to see if we can get her back to her home dose.   Continue other management per Dr Demetrio Lapping original  plan in [facility] and Dr Johnnette Litter' note from today. I have seen and evaluated this patient and discussed it with my IM resident team. Please see the rest of the plan per resident note from today.   [place], [place] 04/06/2014, 11:42 AM.

## 2014-04-06 NOTE — Clinical Documentation Improvement (Signed)
  Patient with an abnormal lab - potassium 3.4 on 04/06/14.  Please clarify the underlying condition responsible for the abnormal lab requiring potassium chloride 40 mEq given 8/20 and document in progress note or discharge summary.    Possible Clinical Conditions: --________________ -Other -Unable to determine  Supporting Information: Risk Factors: Pt with asthma and CHF; on lasix.  Low potassium per RD note 04/06/14  Thank you, Doy Mince, RN [phone] Clinical Documentation Specialist

## 2014-04-06 NOTE — Progress Notes (Signed)
Subjective:  Patient was seen and examined this morning. Patient states she is feeling well, but does not feel like her breathing has improved much. She still complains of wheezing and being " a little short of breath." She slept well last night. She denies any fever, chills, chest pain, nausea, or vomiting.   Objective: Vital signs in last 24 hours: Filed Vitals:   04/06/14 0218 04/06/14 0426 04/06/14 0619 04/06/14 0952  BP: 155/87 159/63  129/77  Pulse: 92 71  81  Temp: 98.3 F (36.8 C) 98.2 F (36.8 C)    TempSrc: Oral Oral    Resp: 20 20    Height:      Weight:   156 lb 14.4 oz (71.169 kg)   SpO2:  100%     Weight change:   Intake/Output Summary (Last 24 hours) at 04/06/14 1044 Last data filed at 04/06/14 1000  Gross per 24 hour  Intake    860 ml  Output   1170 ml  Net   -310 ml   Filed Vitals:   04/06/14 0218 04/06/14 0426 04/06/14 0619 04/06/14 0952  BP: 155/87 159/63  129/77  Pulse: 92 71  81  Temp: 98.3 F (36.8 C) 98.2 F (36.8 C)    TempSrc: Oral Oral    Resp: 20 20    Height:      Weight:   156 lb 14.4 oz (71.169 kg)   SpO2:  100%     General: Vital signs reviewed. Patient is an elderly female in no acute distress and cooperative with exam.  Neck: Supple, trachea midline, normal ROM,no carotid bruit present.  Cardiovascular: Irregularly irregular, regular rate, 2/6 blowing holosystolic murmur heard best at the apex.  Pulmonary/Chest: Diffuse moderate wheezes loudest in the mid lung region, no crackles or rhonchi. Patient did seems to be using accessory muscle use. Patient was not on oxygen via [place]. Abdominal: Soft, non-tender, non-distended, BS +, no suprapubic tenderness, no masses, organomegaly, or guarding present.  Musculoskeletal: No joint deformities, erythema, or stiffness, ROM full and nontender.  Extremities: 1-2+ pitting edema in her left lower extremity extending up to her knee, 1+ pitting edema in her right lower extremity extending to her  knee, pulses symmetric and intact bilaterally. No cyanosis or clubbing. Neurological: AA&O x3, no focal motor deficit, sensory intact to light touch bilaterally.  Skin: Warm, dry and intact. No rashes or erythema. Psychiatric: Normal mood and affect. speech and behavior is normal. Cognition and memory are normal.   Lab Results: Basic Metabolic Panel:  Recent Labs Lab 04/05/14 1241 04/06/14 0500  NA 139 140  K 4.0 3.4*  CL 99 100  CO2 24 29  GLUCOSE 126* 91  BUN 12 11  CREATININE 0.69 0.74  CALCIUM 9.2 9.1   Liver Function Tests:  Recent Labs Lab 04/05/14 1140  AST 50*  ALT 8  ALKPHOS 86  BILITOT 1.4*  PROT 7.4  ALBUMIN 3.5   CBC:  Recent Labs Lab 04/05/14 0839 04/05/14 0904  WBC 3.2*  --   NEUTROABS 1.6*  --   HGB 12.5 14.6  HCT 38.6 43.0  MCV 91.9  --   PLT 198  --    Cardiac Enzymes:  Recent Labs Lab 04/05/14 1359 04/05/14 1600 04/05/14 2300  TROPONINI <0.30 <0.30 <0.30   BNP:  Recent Labs Lab 04/05/14 0855  PROBNP 4201.0*   CBG:  Recent Labs Lab 04/05/14 1556 04/05/14 2024 04/06/14 0421 04/06/14 0544  GLUCAP 214* 247* 117* 96  Hemoglobin A1C:  Recent Labs Lab 04/05/14 1140  HGBA1C 7.9*   Studies/Results: Dg Chest Port 1 View  04/05/2014   CLINICAL DATA:  Shortness of breath.  EXAM: PORTABLE CHEST - 1 VIEW  COMPARISON:  02/17/2014.  12/15/2013.  FINDINGS: Cardiomegaly with mild increase in pulmonary vascularity. Minimal interstitial prominence noted. Blunting of left costophrenic consistent with small effusion again noted. These findings suggest mild congestive heart failure. Bibasilar atelectasis. No pneumothorax. No acute bony abnormality.  IMPRESSION: 1. Severe cardiomegaly again noted. 2. Mild CHF.   Electronically Signed   By: Maisie Fus  Register   On: 04/05/2014 09:10   Medications: I have reviewed the patient's current medications. Scheduled Meds: . antiseptic oral rinse  7 mL Mouth Rinse BID  . brimonidine  1 drop Left Eye  BID  . dorzolamide  1 drop Left Eye BID  . feeding supplement (GLUCERNA SHAKE)  237 mL Oral Q24H  . furosemide  40 mg Intravenous BID  . insulin aspart  0-9 Units Subcutaneous TID WC  . lisinopril  20 mg Oral Daily  . metoprolol succinate  50 mg Oral Daily  . mometasone-formoterol  2 puff Inhalation BID  . [START ON 04/07/2014] multivitamin with minerals  1 tablet Oral Daily  . [START ON 04/07/2014] predniSONE  60 mg Oral Q breakfast  . predniSONE  60 mg Oral Once  . rivaroxaban  20 mg Oral Q supper  . sodium chloride  3 mL Intravenous Q12H   Continuous Infusions:  PRN Meds:.acetaminophen, acetaminophen, ipratropium-albuterol Assessment/Plan: Mild Asthma Exacerbation with concomitant combined CHF exacerbation: Patient presented with worsening dyspnea, productive cough and hemoptysis over the past 2 days. CXR showed severe cardiomegaly with small effusion, mild CHF and bibasilar atelectasis. She has improved with breathing treatments. Patient has remained afebrile, with sats from 94-100% on 2L [place]. Patient had only 120 mL of urine output at 1900 last night, so per instructions, night team gave her 40 mg IV of lasix. They started her back on lasix 40 mg IV BID. Patient was supposed to receive prednisone 60 mg po last night, but the order was never given. We will resume prednisone 60 mg po daily for 5 days. Patient doesn't feel that she has improved much. She still complains of wheezing and has moderate diffuse expiratory wheezing on exam. Patient has not received her prednisone or dulera this morning. Those will be given now and I have ordered a duoneb treatment for her as well. -Continue lisinopril 20 mg daily  -Continue metoprolol XR 50 mg daily  -Duoneb (ipratropium-albuterol) Q4H prn wheezing, shortness of breath  -Dulera (mometasone-formoterol) 2 puffs BID  -Cardiac monitoring  -Daily weights  -Cardiac Diet  -I/Os  -Oxygen via [place] 2L  -Solumedrol 60 mg IV BID for 5 days (day #1) -Lasix 40  mg IV BID  Chest Pain: Patient complains of chest pain that occurred once 2 days prior to admission and once one day prior to admission. Chest pain was a non-radiating pressure like sensation located to the left of her sternum, 6/10, that lasted for 5 minutes each time and resolved. Pain is not worse with palpation or inspiration and has not occurred today. The pain was not associated with an increase in shortness of breath, nausea or vomiting. She has had one negative poc troponin in the ED. EKG shows T wave inversion in leads aVL, V1, V5, and V6 similar to prior and non-specific questionable ST elevation in lead V3. Patient is not currently having chest pain. Chest pain is  most likely due to recent onset of dyspnea and tachypnea. Troponins have been negative x 3. -Cardiac monitoring   Permanent Atrial Fibrillation: Patient is on metoprolol 50 mg daily and Xarelto 20 mg daily at home. On exam, patient is irregularly irregular and tachycardic. EKG shows atrial fibrillation with RVR, 132 bpm. Patient became less tachycardic later in the evening, 80-90s. Her tachycardia may have been exacerbated with nebulizer treatments. Heart rate this morning, 87. -Continue metoprolol 50 mg daily  -Xarelto 20 mg daily   DM type II: Patient has a history of DM type II. Patient is on metformin 1 gram BID. She states her glucose is well controlled at home, 110s to 120s. Her most recent HgbA1C was 6.7 on 11/24/13.  -SSI-sensitive  -CBG checks 4 times daily  -Cardiac diet   Graves disease s/p thyroidectomy: Patient has a history of Graves disease s/p thyroidectomy. Her most recent TSH was normal at 1.410 on 11/24/13. It appears her TSH levels have been WNL since 2008.  -Stable   HLD: Most recent lipid panel showed cholesterol 132, TG 74, HDL 54, and LDL 63. Patient is not currently on a statin at home.  -Recheck lipid panel   DVT/PE ppx: Xarelto 20 mg daily  Dispo: Disposition is deferred at this time, awaiting  improvement of current medical problems.  Anticipated discharge in approximately 1-2 day(s).   The patient does have a current PCP Yolanda Manges(Alex M Wilson, DO) and [facility].  The patient does not have transportation limitations that hinder transportation to clinic appointments.  .Services Needed at time of discharge: Y = Yes, Blank = No PT:   OT:   RN:   Equipment:   Other:     LOS: 1 day   Jill AlexandersAlexa Richardson, DO PGY-1 Internal Medicine Resident Pager # [phone] 04/06/2014 10:44 AM

## 2014-04-07 LAB — BASIC METABOLIC PANEL
Anion gap: 13 (ref 5–15)
BUN: 17 mg/dL (ref 6–23)
CALCIUM: 9.1 mg/dL (ref 8.4–10.5)
CO2: 27 meq/L (ref 19–32)
Chloride: 96 mEq/L (ref 96–112)
Creatinine, Ser: 0.7 mg/dL (ref 0.50–1.10)
GFR calc Af Amer: 88 mL/min — ABNORMAL LOW (ref >90)
GFR calc non Af Amer: 76 mL/min — ABNORMAL LOW (ref >90)
Glucose, Bld: 272 mg/dL — ABNORMAL HIGH (ref 70–99)
POTASSIUM: 3.5 meq/L — AB (ref 3.7–5.3)
Sodium: 136 mEq/L — ABNORMAL LOW (ref 137–147)

## 2014-04-07 LAB — MAGNESIUM: Magnesium: 1.7 mg/dL (ref 1.5–2.5)

## 2014-04-07 LAB — GLUCOSE, CAPILLARY
GLUCOSE-CAPILLARY: 237 mg/dL — AB (ref 70–99)
GLUCOSE-CAPILLARY: 387 mg/dL — AB (ref 70–99)
Glucose-Capillary: 290 mg/dL — ABNORMAL HIGH (ref 70–99)
Glucose-Capillary: 152 mg/dL — ABNORMAL HIGH (ref 70–99)

## 2014-04-07 LAB — PHOSPHORUS: Phosphorus: 2.9 mg/dL (ref 2.3–4.6)

## 2014-04-07 MED ORDER — METHYLPREDNISOLONE SODIUM SUCC 125 MG IJ SOLR
60.0000 mg | Freq: Once | INTRAMUSCULAR | Status: DC
  Filled 2014-04-07: qty 0.96

## 2014-04-07 MED ORDER — FUROSEMIDE 10 MG/ML IJ SOLN
40.0000 mg | Freq: Two times a day (BID) | INTRAMUSCULAR | Status: DC
  Administered 2014-04-07 – 2014-04-08 (×2): 40 mg via INTRAVENOUS
  Filled 2014-04-07 (×2): qty 4

## 2014-04-07 MED ORDER — POLYETHYLENE GLYCOL 3350 17 G PO PACK
17.0000 g | PACK | Freq: Every day | ORAL | Status: DC
  Administered 2014-04-07 – 2014-04-08 (×2): 17 g via ORAL
  Filled 2014-04-07 (×3): qty 1

## 2014-04-07 MED ORDER — POTASSIUM CHLORIDE CRYS ER 20 MEQ PO TBCR
40.0000 meq | EXTENDED_RELEASE_TABLET | Freq: Two times a day (BID) | ORAL | Status: DC
  Administered 2014-04-07: 40 meq via ORAL
  Filled 2014-04-07 (×3): qty 2

## 2014-04-07 MED ORDER — POTASSIUM CHLORIDE CRYS ER 20 MEQ PO TBCR
40.0000 meq | EXTENDED_RELEASE_TABLET | Freq: Once | ORAL | Status: AC
  Administered 2014-04-07: 40 meq via ORAL
  Filled 2014-04-07: qty 2

## 2014-04-07 MED ORDER — GUAIFENESIN-DM 100-10 MG/5ML PO SYRP
5.0000 mL | ORAL_SOLUTION | Freq: Once | ORAL | Status: AC
  Administered 2014-04-07: 5 mL via ORAL
  Filled 2014-04-07: qty 5

## 2014-04-07 MED ORDER — PREDNISONE 50 MG PO TABS
60.0000 mg | ORAL_TABLET | Freq: Every day | ORAL | Status: DC
  Administered 2014-04-08 – 2014-04-09 (×2): 60 mg via ORAL
  Filled 2014-04-07 (×3): qty 1

## 2014-04-07 MED ORDER — INSULIN GLARGINE 100 UNIT/ML ~~LOC~~ SOLN
10.0000 [IU] | Freq: Once | SUBCUTANEOUS | Status: AC
  Administered 2014-04-07: 10 [IU] via SUBCUTANEOUS
  Filled 2014-04-07: qty 0.1

## 2014-04-07 NOTE — Progress Notes (Signed)
SATURATION QUALIFICATIONS: (This note is used to comply with regulatory documentation for home oxygen)  Patient Saturations on Room Air at Rest = 88%  Patient Saturations on Room Air while Ambulating = 87%  Patient Saturations on 2 Liters of oxygen while Ambulating = 90%  Please briefly explain why patient needs home oxygen:   04/07/2014 Veda Canning, PT Pager: [phone]

## 2014-04-07 NOTE — ED Notes (Signed)
Pt with desaturation to 80s after breathing treatment on RA  Pt placed on 2L [place]. spo2 now mid 90s, dr pickering aware.

## 2014-04-07 NOTE — Progress Notes (Signed)
Inpatient Diabetes Program Recommendations  AACE/ADA: New Consensus Statement on Inpatient Glycemic Control (2013)  Target Ranges:  Prepandial:   less than 140 mg/dL      Peak postprandial:   less than 180 mg/dL (1-2 hours)      Critically ill patients:  140 - 180 mg/dL  Results for CHANELL, HEIST (MRN [id number]) as of 04/07/2014 09:13  Ref. Range 04/06/2014 11:01 04/06/2014 16:18 04/06/2014 21:48 04/07/2014 05:47  Glucose-Capillary Latest Range: 70-99 mg/dL 021 (H) 117 (H) 356 (H) 290 (H)    Reason for assessment: CBG elevation  Diabetes history: Type 2 Outpatient Diabetes medications: Metformin 1000mg  bid Current orders for Inpatient glycemic control: Novolog sensitive correction with meals  While patient is on steroids, please consider adding Levemir 7 units (72 x .1) qam and titrate up as needed to achieve fbs of 150mg /dl.  Susette Racer, RN, BA, MHA, CDE Diabetes Coordinator Inpatient Diabetes Program  [phone] (Team Pager) [phone] [facility]) 04/07/2014 9:13 AM

## 2014-04-07 NOTE — Progress Notes (Signed)
Patient alert and oriented x4. Vital signs stable. Patient expresses no needs at this time and resting comfortably in the chair. Will continue to monitor.

## 2014-04-07 NOTE — Discharge Summary (Signed)
Name: [name] MRN: [id number] DOB: 07-22-1926 80 y.o. PCP: Wendy Manges, DO  Date of Admission: 04/05/2014  8:11 AM Date of Discharge: 04/13/2014 Attending Physician: Wendy Edouard, MD  Discharge Diagnosis:  Principal Problem:   Acute on chronic combined systolic and diastolic congestive heart failure Active Problems:   DIABETES MELLITUS, TYPE II   HYPERTENSION   Chronic atrial fibrillation   Asthma with acute exacerbation in adult   Valvular heart disease   Current use of long term anticoagulation--xarelto  Discharge Medications:   Medication List    STOP taking these medications       lisinopril 20 MG tablet  Commonly known as:  PRINIVIL,ZESTRIL      TAKE these medications       albuterol 108 (90 BASE) MCG/ACT inhaler  Commonly known as:  PROVENTIL HFA;VENTOLIN HFA  Inhale 1-2 puffs into the lungs every 6 (six) hours as needed for shortness of breath.     brimonidine 0.1 % Soln  Commonly known as:  ALPHAGAN P  Place 1 drop into the left eye every 12 (twelve) hours.     Fluticasone-Salmeterol 250-50 MCG/DOSE Aepb  Commonly known as:  ADVAIR  Inhale 1 puff into the lungs 2 (two) times daily.     furosemide 20 MG tablet  Commonly known as:  LASIX  Take 1 tablet (20 mg total) by mouth 2 (two) times daily.     metFORMIN 1000 MG tablet  Commonly known as:  GLUCOPHAGE  Take 1 tablet (1,000 mg total) by mouth 2 (two) times daily with a meal.     metoprolol succinate 100 MG 24 hr tablet  Commonly known as:  TOPROL-XL  Take 1 tablet (100 mg total) by mouth daily. Take with or immediately following a meal.     potassium chloride SA 20 MEQ tablet  Commonly known as:  K-DUR,KLOR-CON  Take 2 tablets (40 mEq total) by mouth daily.     Rivaroxaban 15 MG Tabs tablet  Commonly known as:  XARELTO  Take 1 tablet (15 mg total) by mouth daily with supper.     TRUSOPT 2 % ophthalmic solution  Generic drug:  dorzolamide  Place 1 drop into the left eye 2 (two)  times daily.        Disposition and follow-up:   [name] was discharged from [facility] in Good condition.  At the hospital follow up visit please address:  1.  Respiratory Status. Patient presented with asthma exacerbation with concomitant combined CHF exacerbation. Patient improved, but is now requiring home oxygen. Patient should be wearing oxygen continuously at home. We decreased her lasix to 20 mg BID (from 40 mg BID) since she was volume down and having lower blood pressures. Please increase to 40 mg BID if patient needs increased diuresis at baseline. Addition of spironolactone should be considered in the future.  2. Tachycardia. Patient has chronic atrial fibrillation but had tachycardia into the 140s-150s while in the hospital. Tachycardia resolved after increase in metoprolol XR from 50 mg to 100 mg daily. We decreased her Xarelto from 20 mg daily to 15 mg daily due to her creatinine clearance.  3. Blood pressure. Patient had relative hypotension on day 8 of her hospital stay in the 90/50s. These improved slightly, but patient typically runs at 120s-130s/90s. We are holding her lisinopril 20 mg daily. Please recheck blood pressure and restart lisinopril 20 mg daily if patient can tolerate.  4. Potassium level. Patient typically has low-normal potassium,  but had several episodes of hypokalemia during her hospital stay requiring potassium supplementation with KDur 40 mEq. We put her back on potassium home supplementation with potassium chloride 40 mEq daily. Addition of spironolactone should be considered in the future.  5. Please assess if patient is being visited by home health nurse and PT.  6. Patient should be taking her Xarelto 15 mg daily at home with the largest meal of the day.  7.  Labs / imaging needed at time of follow-up: BMET, to assess potassium.   Follow-up Appointments: Follow-up Information   Follow up with Wendy Peek, MD On 04/17/2014.  (10:15)    Specialty:  Internal Medicine   Contact information:   [address] [place] [address] [phone]       Follow up with Wendy Derrick, PA-C On 05/02/2014. (2:00 pm)    Specialty:  Cardiology   Contact information:   [address] [place] [address] [phone]       Follow up with [facility]. (oxygen)    Contact information:   [address] [place] [address] [phone]       Follow up with [facility]. (RN/PT)    Contact information:   [address] [place] [address] [phone]        Discharge Instructions   (HEART FAILURE PATIENTS) Call MD:  Anytime you have any of the following symptoms: 1) 3 pound weight gain in 24 hours or 5 pounds in 1 week 2) shortness of breath, with or without a dry hacking cough 3) swelling in the hands, feet or stomach 4) if you have to sleep on extra pillows at night in order to breathe.    Complete by:  As directed      Call MD for:  extreme fatigue    Complete by:  As directed      Call MD for:  persistant dizziness or light-headedness    Complete by:  As directed      Diet - low sodium heart healthy    Complete by:  As directed      Increase activity slowly    Complete by:  As directed          Procedures Performed:  Dg Chest Port 1 View  04/05/2014   CLINICAL DATA:  Shortness of breath.  EXAM: PORTABLE CHEST - 1 VIEW  COMPARISON:  02/17/2014.  12/15/2013.  FINDINGS: Cardiomegaly with mild increase in pulmonary vascularity. Minimal interstitial prominence noted. Blunting of left costophrenic consistent with small effusion again noted. These findings suggest mild congestive heart failure. Bibasilar atelectasis. No pneumothorax. No acute bony abnormality.  IMPRESSION: 1. Severe cardiomegaly again noted. 2. Mild CHF.   Electronically Signed   By: Maisie Fus  Register   On: 04/05/2014 09:10   Admission HPI: [name] is a 80 yo female with PMHx of  Chronic combined systolic and diastolic congestive heart failure (Echo in 08/2013 showed EF 40-45% and grade 3 diastolic dyfunction, moderate to severe mitral and tricuspid regurgitation, and pulmonary HTN), Asthma, Graves disease s/p thyroidectomy, HLD, permanent atrial fibrillation on metoprolol and Xarelto, and DM type II who presented to the ED with complaint of worsening shortness of breath and cough. Patient stated she developed shortness of breath and a productive cough with yellow-white sputum 2 days prior to admission. She developed audible wheezing and decided to come into the ED. Her shortness of breath was worse on exertion. She tried  using her albuterol and Advair inhalers at home, but it did not give her much relief. Patient also admitted to intermittent hemoptysis. She denied increased weight gain or leg swelling. She did admit to previous chest pain that started 2 days ago located on the left side of her sternum, dull, 6/10 pain that has occurred once 2 days prior to admission and once yesterday and lasted for 5 minutes and did not return. The pain did not radiate, was not worse with inspiration and was not reproducible with palpation. Patient sleeps on 2 pillows at night which is normal for her. She has been taking all of her medications as prescribed without any missed doses. She denied any recent illness, denied any sick contacts, denied recent travel or sudden onset of chest pain or shortness of breath. Patient is active at home and mobile. She walks around at home and participates in household chores and uses her home wheelchair and walker as need. She lives at home with her daughter and granddaughter. She was recently seen in the ED on 02/18/14 for dyspnea for which she was discharged from the ED in stable condition. She stated that her symptoms are more severe this time. She denied fever, chills, nausea, vomiting, abdominal pain, diarrhea, dysuria, or any recent falls.  Hospital Course by  problem list:  Mild Asthma Exacerbation with concomitant combined CHF exacerbation: Patient presented with worsening dyspnea, productive cough and hemoptysis. CXR showed severe cardiomegaly with small effusion, mild CHF and bibasilar atelectasis. Her exacerbations may have been caused by a viral bronchitis. We treated her asthma exacerbation with Duoneb treatments Q4H prn, continued her Dulera 2 puffs BID and started her on methylprednisolone BID for 2 days and transitioned her to prednisone 60 mg daily for 4 days. Patient used continuous oxygen at 2L via [place] and saturated 97-100%. Her wheezing resolved. However, the patient remained short of breath and although she does not use home oxygen, the patient required continuous oxygen in the hospital. Her repeat CXR showed CHF with mild interstitial edema and a possible pericardial effusion. 2D echocardiogram showed EF 35-40% with akinesis of the entire anteroseptal myocardium, entire inferior myocardium and basal-midinferoseptal myocardium. This is a deterioration from prior TTE done in January 2015 which showed EF of 40-45% and grade III diastolic dysfunction. We originally treated [name] with lasix 40 mg IV BID, but patient remained volume overloaded, short of breath, and was not diuresing well. Her BUN/Cr remained within normal limits. We consulted Cardiology for Heart Failure management and Dr. Antoine Poche recommended continuing diuresis with increased lasix 80 mg IV BID and adding metolazone 2.5 mg daily. Patient received these more aggressive doses for 3 days. We also added spironolactone 12.5 mg. Patient diuresed well and her shortness of breath improved. However, on hospital day 8, the patient was more somnolent and fatigue and not at her baseline. Her blood pressure was lower than normal, 92/57, likely from over diureses. Her fatigue may have been contributed to by increased metoprolol at 100 mg daily and the addition of spironolactone. We decided to hold her  lasix, discontinue metolazone, discontinue spironolactone and decreased lisinopril from 40 mg to 20 mg daily with BP holding parameters. The following day, the patient was back at baseline, alert and talkative. Her blood pressures were improved, but still lower than her average. She typically averages 120s-140s/70s-90s. She was averaging 100s-120s/60s the last few days. We held patient's lisinopril 20 mg daily on discharged. We decreased her lasix to 20 mg po BID.  Chronic Atrial Fibrillation with Tachycardia: Patient is on metoprolol 50 mg daily and Xarelto 20 mg daily at home. On admission, patient was irregularly irregular and tachycardic. EKG showed atrial fibrillation with RVR, HR 132 bpm. Patient had been less tachycardic during her stay, HR 80-90s, but she continued to have frequent tachycardic episodes in the 140s -150s. Her tachycardia was likely exacerbated by her nebulizer treatments. Cardiology was consulted and recommended increasing her metoprolol from 50 mg daily to 100 mg daily. This transition normalized her heart rate in the 70s-100s. Patient no longer had tachycardic episodes. We continued her metoprolol 100 mg XR daily. We decreased her Xarelto from 20 mg to 15 mg daily due to creatinine clearance.  Chest Pain: Patient complained of chest pain that occurred once 2 days prior to admission and once one day prior to admission. EKG showed T wave inversion in leads aVL, V1, V5, and V6 similar to prior and non-specific questionable ST elevation in lead V3. Chest pain was most likely due to recent onset of dyspnea and tachypnea. Troponins were negative x 3. Repeat EKG was similar to prior. We placed patient on cardiac monitoring. Patient did not have recurrent chest pain during her admission.   H/o Submassive left lower lobe pulmonary embolism complicated by right heart strain: CTA chest 08/2013 showed an embolus in the distal left lower lobe pulmonary artery extending into all basal segmental and  subsegmental sized branches with no definite hemorrhage to suggest pulmonary infarction. Etiology of the PE was unknown possibly due to non-compliance with Xarelto. Patient is currently on Xarelto 20 mg daily per cardiology recommendations. We decreased her Xarelto from 20 mg to 15 mg daily due to creatinine clearance.. Patient should be taking Xarelto with her largest meal of the day.  DM type II: Patient has a history of DM type II. Patient is on metformin 1 gram BID. She states her glucose is well controlled at home, 110s to 120s. Her most recent HgbA1C was 6.7 on 11/24/13. She had elevated levels of blood glucose during admission into the 290s, most likely due to steroid treatments. We added Lantus 10 Units for additional blood glucose control. We discharged the patient home on her Metformin 1 gram BID.   Hypokalemia: Patient has chronically low-normal potassium, baseline is about 3.6, most likely contributed to by Lasix at home. She ran low during her admission (3.3-4.4) and required almost daily potassium replacement with KDur 40 mEq. We will add on home potassium supplementation KCl 40 mEq daily. Spironolactone should be considered in the future for this patient.  Graves disease s/p thyroidectomy: Patient has a history of Graves disease s/p thyroidectomy. Her most recent TSH was normal at 1.410 on 11/24/13. It appears her TSH levels have been WNL since 2008.   HLD: Patient's previous lipid panel on 03/16/13 showed cholesterol 132, TG 74, HDL 54, and LDL 63. Patient is not currently on a statin at home. Repeat lipid panel during admission showed cholesterol 129, TG 52, HDL 54, LDL 65. Her results are within normal limits, and she was not started on a statin.   Discharge Vitals:   BP 110/60  Pulse 90  Temp(Src) 98 F (36.7 C) (Oral)  Resp 18  Ht 5\' 5"  (1.651 m)  Wt 66.3 kg (146 lb 2.6 oz)  BMI 24.32 kg/m2  SpO2 100%  Discharge Labs:  Results for orders placed during the hospital encounter of  04/05/14 (from the past 24 hour(s))  GLUCOSE, CAPILLARY     Status: Abnormal  Collection Time    04/12/14  4:35 PM      Result Value Ref Range   Glucose-Capillary 151 (*) 70 - 99 mg/dL   Comment 1 Notify RN    GLUCOSE, CAPILLARY     Status: Abnormal   Collection Time    04/12/14  8:59 PM      Result Value Ref Range   Glucose-Capillary 143 (*) 70 - 99 mg/dL   Comment 1 Documented in Chart     Comment 2 Notify RN    GLUCOSE, CAPILLARY     Status: None   Collection Time    04/13/14  5:44 AM      Result Value Ref Range   Glucose-Capillary 72  70 - 99 mg/dL  GLUCOSE, CAPILLARY     Status: None   Collection Time    04/13/14  6:10 AM      Result Value Ref Range   Glucose-Capillary 74  70 - 99 mg/dL   Comment 1 Documented in Chart     Comment 2 Notify RN    BASIC METABOLIC PANEL     Status: Abnormal   Collection Time    04/13/14 10:40 AM      Result Value Ref Range   Sodium 134 (*) 137 - 147 mEq/L   Potassium 3.3 (*) 3.7 - 5.3 mEq/L   Chloride 88 (*) 96 - 112 mEq/L   CO2 36 (*) 19 - 32 mEq/L   Glucose, Bld 197 (*) 70 - 99 mg/dL   BUN 22  6 - 23 mg/dL   Creatinine, Ser 6.960.67  0.50 - 1.10 mg/dL   Calcium 8.7  8.4 - 29.510.5 mg/dL   GFR calc non Af Amer 77 (*) >90 mL/min   GFR calc Af Amer 90 (*) >90 mL/min   Anion gap 10  5 - 15  CBC     Status: Abnormal   Collection Time    04/13/14 11:00 AM      Result Value Ref Range   WBC 3.7 (*) 4.0 - 10.5 K/uL   RBC 4.50  3.87 - 5.11 MIL/uL   Hemoglobin 13.4  12.0 - 15.0 g/dL   HCT 28.442.2  13.236.0 - 44.046.0 %   MCV 93.8  78.0 - 100.0 fL   MCH 29.8  26.0 - 34.0 pg   MCHC 31.8  30.0 - 36.0 g/dL   RDW 10.214.7  72.511.5 - 36.615.5 %   Platelets 308  150 - 400 K/uL  GLUCOSE, CAPILLARY     Status: Abnormal   Collection Time    04/13/14 12:32 PM      Result Value Ref Range   Glucose-Capillary 143 (*) 70 - 99 mg/dL    Signed: Jill AlexandersAlexa Richardson, DO PGY-1 Internal Medicine Resident Pager # [phone] 04/13/2014 2:46 PM

## 2014-04-07 NOTE — Progress Notes (Signed)
Subjective: [name] is an 80 YO woman with a PMH of asthma, CHF, A fib, DM2, and HLD on HD2 who initially presented with SOB, wheezing, and two brief episodes of chest pain. Overnight, the patient reports that she is still wheezing. Her O2 sat was 100% on 2.5L [place]. She was able to sleep through the night and had no acute overnight events. She denies any chest pain, fever, cough, nausea, or vomiting. Her appetite is improving and she is eating a normal diet. She has been urinating frequently without burning or pain. Her fingerstick blood glucose was 290 this morning at 0547. Patient also reports that she has not had a bowel movement in 2 days and would like something to help with this.  Objective: Vital signs in last 24 hours: Filed Vitals:   04/06/14 2025 04/06/14 2309 04/07/14 0129 04/07/14 0532  BP: 147/87  141/81 143/93  Pulse: 139 75 72 116  Temp: 99.1 F (37.3 C)  97.9 F (36.6 C) 98.4 F (36.9 C)  TempSrc: Oral  Oral Oral  Resp: 18  20 20   Height:      Weight:    72.1 kg (158 lb 15.2 oz)  SpO2: 99%  99% 100%   Weight change: 1.3 kg (2 lb 13.9 oz)  Intake/Output Summary (Last 24 hours) at 04/07/14 [id number] Last data filed at 04/07/14 0650  Gross per 24 hour  Intake    840 ml  Output   1525 ml  Net   -685 ml   BP 143/93  Pulse 116  Temp(Src) 98.4 F (36.9 C) (Oral)  Resp 20  Ht 5\' 5"  (1.651 m)  Wt 72.1 kg (158 lb 15.2 oz)  BMI 26.45 kg/m2  SpO2 100% General appearance: alert, cooperative and no distress Head: Normocephalic, without obvious abnormality, atraumatic Lungs: Mild wheezing bilaterally throughout lung fields Heart: irregularly irregular rhythm and tachycardic. 2/6 systolic murmur heard best over the apex Abdomen: soft, non-tender; bowel sounds normal; no masses,  no organomegaly Extremities: edema 1+ in L leg, trace in R leg Skin: Skin color, texture, turgor normal. No rashes or lesions Neurologic: Grossly normal  Lab Results: Basic Metabolic  Panel:  Recent Labs Lab 04/05/14 1241 04/06/14 0500  NA 139 140  K 4.0 3.4*  CL 99 100  CO2 24 29  GLUCOSE 126* 91  BUN 12 11  CREATININE 0.69 0.74  CALCIUM 9.2 9.1   Liver Function Tests:  Recent Labs Lab 04/05/14 1140  AST 50*  ALT 8  ALKPHOS 86  BILITOT 1.4*  PROT 7.4  ALBUMIN 3.5   CBC:  Recent Labs Lab 04/05/14 0839 04/05/14 0904  WBC 3.2*  --   NEUTROABS 1.6*  --   HGB 12.5 14.6  HCT 38.6 43.0  MCV 91.9  --   PLT 198  --    Cardiac Enzymes:  Recent Labs Lab 04/05/14 1359 04/05/14 1600 04/05/14 2300  TROPONINI <0.30 <0.30 <0.30   BNP:  Recent Labs Lab 04/05/14 0855  PROBNP 4201.0*   D-Dimer: No results found for this basename: DDIMER,  in the last 168 hours CBG:  Recent Labs Lab 04/06/14 0421 04/06/14 0544 04/06/14 1101 04/06/14 1618 04/06/14 2148 04/07/14 0547  GLUCAP 117* 96 144* 294* 224* 290*   Hemoglobin A1C:  Recent Labs Lab 04/05/14 1140  HGBA1C 7.9*   Fasting Lipid Panel:  Recent Labs Lab 04/06/14 0500  CHOL 129  HDL 54  LDLCALC 65  TRIG 52  CHOLHDL 2.4   [facility]Results[facility]  [facility]2014   CLINICAL DATA:  Shortness of breath.  EXAM: PORTABLE CHEST - 1 VIEW  COMPARISON:  02/17/2014.  12/15/2013.  FINDINGS: Cardiomegaly with mild increase in pulmonary vascularity. Minimal interstitial prominence noted. Blunting of left costophrenic consistent with small effusion again noted. These findings suggest mild congestive heart failure. Bibasilar atelectasis. No pneumothorax. No acute bony abnormality.  IMPRESSION: 1. Severe cardiomegaly again noted. 2. Mild CHF.   Electronically Signed   By: Maisie Fus  Register   On: 04/05/2014 09:10   Medications: I have reviewed the patient's current medications. Scheduled Meds: . antiseptic oral rinse  7 mL Mouth Rinse BID  . brimonidine  1 drop Left Eye BID  . dorzolamide  1 drop Left Eye BID  . feeding supplement (GLUCERNA SHAKE)  237 mL Oral Q24H  . furosemide  40  mg Intravenous BID  . insulin aspart  0-9 Units Subcutaneous TID WC  . lisinopril  20 mg Oral Daily  . methylPREDNISolone (SOLU-MEDROL) injection  60 mg Intravenous Q12H  . metoprolol succinate  50 mg Oral Daily  . mometasone-formoterol  2 puff Inhalation BID  . multivitamin with minerals  1 tablet Oral Daily  . rivaroxaban  20 mg Oral Q supper  . sodium chloride  3 mL Intravenous Q12H   Continuous Infusions:  PRN Meds:.acetaminophen, acetaminophen, ipratropium-albuterol  Assessment/Plan: Principal Problem:   Asthma with acute exacerbation in adult Active Problems:   DIABETES MELLITUS, TYPE II   HYPERTENSION   Acute on chronic combined systolic and diastolic congestive heart failure   Atrial fibrillation   Valvular heart disease   Severe protein-calorie malnutrition   Dyspnea   Current use of long term anticoagulation--xarelto  Assessment & Plan by Problem:  Mild Asthma Exacerbation with concomitant combined CHF exacerbation: Patient presented with worsening dyspnea, productive cough and hemoptysis over the past 2 days. CXR revealed cardiomegaly with mild increase in pulmonary vascularity. The L costophrenic angle was blunted with a small effusion noted, suggestive of mild CHF. Her O2 sats have been stable, ranging from 94-100% on 2L [place]. We will continue to monitor her urine output closely to ensure that she won't become volume depleted with the Lasix treatment. Net I/O from 8/20 was -685 mL.To better control her wheezing and inflammation, solumedrol 60 mg IV bid will be continued for 4 days. Repeat BMP was ordered for thus morning to recheck K+ level. Serial troponins were negative x3.  -Solumedrol 60 mg IV bid beginning 8/20 for 5 days  -Lasix 40 mg BID IV  -Oxygen via 2.5L [place] -Recheck K+ level -Continue lisinopril 20 mg daily  -Continue metoprolol XR 50 mg daily  -Duoneb (ipratropium-albuterol) Q4H prn wheezing, shortness of breath  -Dulera (mometasone-formoterol) 2 puffs BID   -Cardiac monitoring  -Daily weights  -Cardiac Diet  -I/Os  -Orthostatic vital signs  -Troponins negative x 3   Chest Pain: Patient complains of chest pain that occurred once 2 days prior to admission and once one day prior to admission. EKG shows T wave inversion in leads aVL, V1, V5, and V6 similar to prior and non-specific questionable ST elevation in lead V3. Patient has not had additional episodes of chest pain since hospitalization. Chest pain is most likely due to recent onset of dyspnea and tachypnea. -Troponins negative x 3  -Cardiac monitoring  Permanent Atrial Fibrillation: Patient is on metoprolol 50 mg daily and Xarelto 20 mg daily at home. On exam, patient has an irregularly irregular rhythm. EKG shows atrial fibrillation with RVR, 132  bpm. Patient became less tachycardic later in the evening after metoprolol was resumed. Tachycardia likely due to nebulizer treatments.  -Continue metoprolol 50 mg daily  -Xarelto 20 mg daily   DM type II: Patient has a history of DM type II. Patient is on metformin 1 gram BID. She states her glucose is well controlled at home, 110s to 120s. Her most recent HgbA1C was 6.7 on 11/24/13. At 0547 today, her finger stick glucose was 290.  -SSI-sensitive  -CBG checks 4 times daily  -Cardiac diet   Graves disease s/p thyroidectomy: Patient has a history of Graves disease s/p thyroidectomy. Her most recent TSH was normal at 1.410 on 11/24/13. It appears her TSH levels have been WNL since 2008.  -Stable   HLD: Lipid panel on 8/20 showed cholesterol 129, TG 52, HDL 54, and LDL 65. Patient is not currently on a statin at home.   DVT/PE ppx: Xarelto 20 mg daily   Dispo: Disposition is deferred at this time, awaiting improvement of wheezing. Anticipated discharge in approximately 1-2 day(s).   The patient does have a current PCP Yolanda Manges, DO) and does need an [facility] hospital follow-up appointment after discharge.   The patient does not have  transportation limitations that hinder transportation to clinic appointments.   This is a Psychologist, occupational Note.  The care of the patient was discussed with Dr. Senaida Ores and the assessment and plan formulated with their assistance.  Please see their attached note for official documentation of the daily encounter.   LOS: 2 days   Alford Highland, Med Student 04/07/2014, 8:08 AM

## 2014-04-07 NOTE — Progress Notes (Signed)
  PROGRESS NOTE MEDICINE TEACHING ATTENDING   Day 2 of stay Patient name: [name]   Medical record number: [id number] Date of birth: [birth year]   Met patient with the entire team on morning rounds. Doing better than yesterday - less wheezing reported. Less shortness of breath. No more chest pain. Cough improved. Heart rate ranges from 72-139, afib.  Filed Vitals:   04/07/14 0532  BP: 143/93  Pulse: 116  Temp: 98.4 F (36.9 C)  Resp: 20   Exam is significant for alert and oriented patient. Sitting up in chair, wearing nasal canula. Able to talk in full sentences. Cardiac - irregularly irregular rhythm, tachycardia. Lungs - no wheezing. Good air movement through the lungs. Pedal edema mild. Abdomen - soft and non tender.   I have reviewed the chart, lab results, EKG, imaging and relevant notes of this patient.   Assessment and Plan                                                                       - Continue IV lasix BID.  - Change to PO steroids. - Agree with addition of lantus to manage diabetes.  - Continue duonebs. - Continue toprol 50. Tachycardia likely due to current exacerbation and nebs.    I have discussed the care of this patient with my IM team residents. Please see the resident note for details.  [place], [place] 04/07/2014, 10:36 AM.

## 2014-04-07 NOTE — Progress Notes (Signed)
  I have seen and examined the patient, and reviewed the daily progress note by Staci Righter, MS III and discussed the care of the patient with them. Please see my progress note from 04/07/2014 for further details regarding assessment and plan.    Signed:  Jill Alexanders, DO PGY-1 Internal Medicine Resident Pager # [phone] 04/07/2014 2:04 PM

## 2014-04-07 NOTE — Evaluation (Addendum)
Physical Therapy Evaluation Patient Details Name: [name] MRN: [id number] DOB: May 22, 1926 Today's Date: 04/07/2014   History of Present Illness  Adm 04/05/14 with dyspnea found to be due to both asthma and CHF exacerbation. PMHx- CHF with EF 40%, asthma, Graves disease, afib, DM, osteopenia, vasculitis, Rt hip hemiarthroplasty   Clinical Impression  Pt admitted with above. Pt tolerated ambulation in room only this date, due to generalized weakness and cardiopulmonary status (decr SaO2, tachycardia). Pt confident family can take care of her at home. Pt currently with functional limitations due to the deficits listed below (see PT Problem List).  Pt will benefit from skilled PT to increase their independence and safety with mobility to allow discharge to the venue listed below.       Follow Up Recommendations Home health PT;Supervision/Assistance - 24 hour    Equipment Recommendations  None recommended by PT    Recommendations for Other Services       Precautions / Restrictions Precautions Precautions: Fall      Mobility  Bed Mobility Overal bed mobility: Needs Assistance Bed Mobility: Supine to Sit     Supine to sit: Supervision;HOB elevated     General bed mobility comments: incr time and vc for technique, however no physical assist  Transfers Overall transfer level: Needs assistance Equipment used: Rolling walker (2 wheeled) Transfers: Sit to/from Stand Sit to Stand: Min assist;From elevated surface         General transfer comment: assist to shift forward over her feet; pt's technique improved over 4 transfers  Ambulation/Gait Ambulation/Gait assistance: Min assist Ambulation Distance (Feet): 10 Feet (10, 5, 5) Assistive device: Rolling walker (2 wheeled) Gait Pattern/deviations: Step-through pattern;Decreased stride length;Shuffle;Wide base of support   Gait velocity interpretation: <1.8 ft/sec, indicative of risk for recurrent falls General Gait Details:  constant cues for safe use of RW and at times assist to maneuver  Stairs            Wheelchair Mobility    Modified Rankin (Stroke Patients Only)       Balance Overall balance assessment: Needs assistance Sitting-balance support: No upper extremity supported;Feet supported Sitting balance-Leahy Scale: Fair     Standing balance support: Bilateral upper extremity supported Standing balance-Leahy Scale: Poor                               Pertinent Vitals/Pain Pain Assessment: No/denies pain  On 2L at rest SaO2 93% On Room air at rest 88% On room air with walking 87% On 2L with ambulation 90%  HR 119-140     Home Living Family/patient expects to be discharged to:: Private residence Living Arrangements: Children;Other relatives Available Help at Discharge: Family;Available 24 hours/day (grandaughter) Type of Home: House Home Access: Stairs to enter Entrance Stairs-Rails: None Entrance Stairs-Number of Steps: 2 Home Layout: One level Home Equipment: Walker - 2 wheels;Cane - single point;Toilet riser;Bedside commode;Hospital bed;Wheelchair - Careers advisermanual;Other (comment) (lift chair)      Prior Function Level of Independence: Needs assistance   Gait / Transfers Assistance Needed: assist on steps; modified independent with RW inside home (per pt)  ADL's / Homemaking Assistance Needed: assist with bathing and dressing        Hand Dominance   Dominant Hand: Right    Extremity/Trunk Assessment   Upper Extremity Assessment: Generalized weakness           Lower Extremity Assessment: Generalized weakness      Cervical /  Trunk Assessment: Kyphotic  Communication   Communication: HOH  Cognition Arousal/Alertness: Awake/alert Behavior During Therapy: WFL for tasks assessed/performed Overall Cognitive Status: Within Functional Limits for tasks assessed                      General Comments      Exercises        Assessment/Plan     PT Assessment Patient needs continued PT services  PT Diagnosis Difficulty walking;Generalized weakness   PT Problem List Decreased strength;Decreased activity tolerance;Decreased balance;Decreased mobility;Decreased knowledge of use of DME;Cardiopulmonary status limiting activity  PT Treatment Interventions DME instruction;Gait training;Stair training;Functional mobility training;Therapeutic activities;Therapeutic exercise;Balance training;Patient/family education;Wheelchair mobility training   PT Goals (Current goals can be found in the Care Plan section) Acute Rehab PT Goals Patient Stated Goal: return home when she is stronger PT Goal Formulation: With patient Time For Goal Achievement: 04/14/14 Potential to Achieve Goals: Good    Frequency Min 3X/week   Barriers to discharge        Co-evaluation               End of Session Equipment Utilized During Treatment: Gait belt;Oxygen Activity Tolerance: Patient limited by fatigue Patient left: in chair;with call bell/phone within reach;with chair alarm set Nurse Communication: Mobility status;Other (comment) (used [facility])         Time: 8088-1103 PT Time Calculation (min): 43 min   Charges:   PT Evaluation $Initial PT Evaluation Tier I: 1 Procedure PT Treatments $Gait Training: 8-22 mins $Therapeutic Activity: 8-22 mins   PT G Codes:          [name] 04-20-2014, 11:23 AM Pager [phone]

## 2014-04-07 NOTE — Progress Notes (Addendum)
Subjective:  Patient was seen and examined this morning. Patient states that she feels better than yesterday, but has intermittent episodes of feeling short of breath. Her wheezing has improved. Patient is no longer coughing. She was given Robitussin last night which resolved her symptom. She also states that her legs get intermittently more swollen, but that is usual for her. She believes the swelling has improved from when she was admitted. Patient denies any fever, chills, chest pain, cough, nausea, vomiting, diarrhea, abdominal pain or calf tenderness.  Objective: Vital signs in last 24 hours: Filed Vitals:   04/06/14 2309 04/07/14 0129 04/07/14 0532 04/07/14 1140  BP:  141/81 143/93 143/83  Pulse: 75 72 116 97  Temp:  97.9 F (36.6 C) 98.4 F (36.9 C) 98.3 F (36.8 C)  TempSrc:  Oral Oral Oral  Resp:  20 20 18   Height:      Weight:   158 lb 15.2 oz (72.1 kg)   SpO2:  99% 100% 98%   Weight change: 2 lb 13.9 oz (1.3 kg)  Intake/Output Summary (Last 24 hours) at 04/07/14 1303 Last data filed at 04/07/14 1155  Gross per 24 hour  Intake    680 ml  Output   1075 ml  Net   -395 ml    General: Vital signs reviewed. Patient is an elderly female in no acute distress and cooperative with exam. Laying comfortably in bed watching tv. Neck: Supple, trachea midline, normal ROM, no carotid bruit present.  Cardiovascular: Irregularly irregular, regular rate, 2/6 blowing holosystolic murmur heard best at the apex.  Pulmonary/Chest: Clear to auscultation bilaterally, no wheezing, rhonchi or rales. No accessory muscle use. Patient was on 2.5 L oxygen via [place]. Abdominal: Soft, non-tender, non-distended, BS +, no suprapubic tenderness, no masses, organomegaly, or guarding present.  Musculoskeletal: No joint deformities, erythema, or stiffness, ROM full and nontender.  Extremities: 1-2+ pitting edema in her left lower extremity extending up to mid-shin, 1+ pitting edema in her right lower  extremity extending to her mid-shin, no calf tenderness, pulses symmetric and intact bilaterally. No cyanosis or clubbing. Neurological: AA&O x3, no focal motor deficit, sensory intact to light touch bilaterally.  Skin: Warm, dry and intact. No rashes or erythema. Psychiatric: Normal mood and affect. speech and behavior is normal. Cognition and memory are normal.   Lab Results: Basic Metabolic Panel:  Recent Labs Lab 04/06/14 0500 04/07/14 1003 04/07/14 1015  NA 140 136*  --   K 3.4* 3.5*  --   CL 100 96  --   CO2 29 27  --   GLUCOSE 91 272*  --   BUN 11 17  --   CREATININE 0.74 0.70  --   CALCIUM 9.1 9.1  --   MG  --   --  1.7   Liver Function Tests:  Recent Labs Lab 04/05/14 1140  AST 50*  ALT 8  ALKPHOS 86  BILITOT 1.4*  PROT 7.4  ALBUMIN 3.5   CBC:  Recent Labs Lab 04/05/14 0839 04/05/14 0904  WBC 3.2*  --   NEUTROABS 1.6*  --   HGB 12.5 14.6  HCT 38.6 43.0  MCV 91.9  --   PLT 198  --    Cardiac Enzymes:  Recent Labs Lab 04/05/14 1359 04/05/14 1600 04/05/14 2300  TROPONINI <0.30 <0.30 <0.30   BNP:  Recent Labs Lab 04/05/14 0855  PROBNP 4201.0*   CBG:  Recent Labs Lab 04/06/14 0544 04/06/14 1101 04/06/14 1618 04/06/14 2148 04/07/14 0547  04/07/14 1113  GLUCAP 96 144* 294* 224* 290* 237*   Hemoglobin A1C:  Recent Labs Lab 04/05/14 1140  HGBA1C 7.9*   Studies/Results: No results found. Medications: I have reviewed the patient's current medications.  Prescriptions prior to admission  Medication Sig Dispense Refill  . albuterol (PROVENTIL HFA;VENTOLIN HFA) 108 (90 BASE) MCG/ACT inhaler Inhale 1-2 puffs into the lungs every 6 (six) hours as needed for shortness of breath.  18 g  11  . brimonidine (ALPHAGAN P) 0.1 % SOLN Place 1 drop into the left eye every 12 (twelve) hours.  15 mL  1  . dorzolamide (TRUSOPT) 2 % ophthalmic solution Place 1 drop into the left eye 2 (two) times daily.       . Fluticasone-Salmeterol (ADVAIR)  250-50 MCG/DOSE AEPB Inhale 1 puff into the lungs 2 (two) times daily.  60 each  11  . furosemide (LASIX) 40 MG tablet Take 1 tablet (40 mg total) by mouth 2 (two) times daily.  60 tablet  2  . lisinopril (PRINIVIL,ZESTRIL) 20 MG tablet Take 1 tablet (20 mg total) by mouth daily.  30 tablet  3  . metFORMIN (GLUCOPHAGE) 1000 MG tablet Take 1 tablet (1,000 mg total) by mouth 2 (two) times daily with a meal.  60 tablet  3  . metoprolol succinate (TOPROL-XL) 50 MG 24 hr tablet Take 1 tablet (50 mg total) by mouth daily. Take with or immediately following a meal.  30 tablet  3  . potassium chloride SA (K-DUR,KLOR-CON) 20 MEQ tablet Take 2 tablets (40 mEq total) by mouth daily.  60 tablet  2  . rivaroxaban (XARELTO) 20 MG TABS tablet Take 1 tablet (20 mg total) by mouth every morning.  30 tablet  3    Scheduled Meds: . antiseptic oral rinse  7 mL Mouth Rinse BID  . brimonidine  1 drop Left Eye BID  . dorzolamide  1 drop Left Eye BID  . feeding supplement (GLUCERNA SHAKE)  237 mL Oral Q24H  . furosemide  40 mg Intravenous BID  . insulin aspart  0-9 Units Subcutaneous TID WC  . lisinopril  20 mg Oral Daily  . metoprolol succinate  50 mg Oral Daily  . mometasone-formoterol  2 puff Inhalation BID  . multivitamin with minerals  1 tablet Oral Daily  . polyethylene glycol  17 g Oral Daily  . potassium chloride  40 mEq Oral Once  . [START ON 04/08/2014] predniSONE  60 mg Oral Q breakfast  . rivaroxaban  20 mg Oral Q supper  . sodium chloride  3 mL Intravenous Q12H   Continuous Infusions:  PRN Meds:.acetaminophen, acetaminophen, ipratropium-albuterol Assessment/Plan: Mild Asthma Exacerbation with concomitant combined CHF exacerbation: Patient presented with worsening dyspnea, productive cough and hemoptysis. CXR showed severe cardiomegaly with small effusion, mild CHF and bibasilar atelectasis. Exacerbations may have been caused by a viral bronchitis. She has improved greatly with breathing treatments.  Ins yesterday: 840. Out: 1,525. Net: -685 mL. On exam, patient is clear to auscultation bilaterally, no more wheezing. However, patient had just received prednisone and a duoneb treatment at 0600. Her oxygen saturation has been 94-100% on 2-2.5 L of oxygen via [place]. Her lower extremity edema has improved, however this is a constant issue for her. -Continue lisinopril 20 mg daily  -Continue metoprolol XR 50 mg daily  -Duoneb (ipratropium-albuterol) Q4H prn wheezing, shortness of breath  -Dulera (mometasone-formoterol) 2 puffs BID  -Cardiac monitoring  -Daily weights  -Cardiac Diet  -I/Os  -Oxygen via [place]  2L  -Transition from Solumedrol IV to Prednisone 60 mg po daily starting tomorrow -Lasix 40 mg IV BID, consider transitioning to her home dose tomorrow  Chest Pain: Patient complained of chest pain that occurred once 2 days prior to admission and once one day prior to admission.  EKG shows T wave inversion in leads aVL, V1, V5, and V6 similar to prior and non-specific questionable ST elevation in lead V3. Chest pain is most likely due to recent onset of dyspnea and tachypnea. Troponins have been negative x 3. Repeat EKG is similar to prior. Patient has not had recurrent chest pain. -Cardiac monitoring   Permanent Atrial Fibrillation: Patient is on metoprolol 50 mg daily and Xarelto 20 mg daily at home. On admission, patient was irregularly irregular and tachycardic. EKG showed atrial fibrillation with RVR, HR 132 bpm. Patient has been less tachycardic during her stay, HR 80-90s, but she had some tachycardic episodes overnight, HR 139 and 116. Her tachycardia may be exacerbated by nebulizer treatments. This morning, patient was in the 80s, and denied any chest pain or palpitations. -Continue metoprolol 50 mg daily, if patient remains tachycardic, can consider increasing her metoprolol dose. However, it seems for now patient has had normal rates in the 70s, so we would worry about causing  bradycardia. -Xarelto 20 mg daily  -Cardiac monitoring  DM type II: Patient has a history of DM type II. Patient is on metformin 1 gram BID. She states her glucose is well controlled at home, 110s to 120s. Her most recent HgbA1C was 6.7 on 11/24/13. Increased blood glucose today, 290s-most likely due to steroid treatments. We will add Lantus 10 Units. -Lantus 10 Units, we will consider continuing or increasing this daily dose based on how the patient does overnight. -SSI-sensitive  -CBG checks 4 times daily  -Cardiac diet   Hypokalemia: Patient's potassium was 3.4 yesterday. Patient was given KDur 40 mEq since patient would be receiving lasix. Repeat BMET showed K 3.5. We gave another KDur 40 mEq and will repeat BMET in am. Mg 1.7, normal. Patient has chronically low-normal potassium, baseline ~3.6, most likely contributed to by Lasix at home. -KDur 40 mEq once -BMET in am -Phosphorus level pending. -Consider adding on potassium supplementation tomorrow for chronically low potassium.  Graves disease s/p thyroidectomy: Patient has a history of Graves disease s/p thyroidectomy. Her most recent TSH was normal at 1.410 on 11/24/13. It appears her TSH levels have been WNL since 2008.  -Stable   HLD: Most recent lipid panel on 03/16/13 showed cholesterol 132, TG 74, HDL 54, and LDL 63. Patient is not currently on a statin at home. Repeat lipid panel shows cholesterol 129, TG 52, HDL 54, LDL 65.  -Stable  DVT/PE ppx: Xarelto 20 mg daily  Dispo: Disposition is deferred at this time, awaiting improvement of current medical problems.  Anticipated discharge in approximately 1-2 day(s).   The patient does have a current PCP Yolanda Manges(Alex M Wilson, DO) and [facility] after discharge.  The patient does not have transportation limitations that hinder transportation to clinic appointments.  .Services Needed at time of discharge: Y = Yes, Blank = No PT:   OT:   RN:    Equipment:   Other:     LOS: 2 days   Jill AlexandersAlexa Richardson, DO PGY-1 Internal Medicine Resident Pager # [phone] 04/07/2014 1:03 PM

## 2014-04-08 ENCOUNTER — Inpatient Hospital Stay (HOSPITAL_COMMUNITY): Payer: PRIVATE HEALTH INSURANCE

## 2014-04-08 DIAGNOSIS — I5041 Acute combined systolic (congestive) and diastolic (congestive) heart failure: Secondary | ICD-10-CM

## 2014-04-08 LAB — BASIC METABOLIC PANEL
ANION GAP: 9 (ref 5–15)
BUN: 22 mg/dL (ref 6–23)
CALCIUM: 9.1 mg/dL (ref 8.4–10.5)
CO2: 31 mEq/L (ref 19–32)
CREATININE: 0.82 mg/dL (ref 0.50–1.10)
Chloride: 97 mEq/L (ref 96–112)
GFR, EST AFRICAN AMERICAN: 73 mL/min — AB (ref >90)
GFR, EST NON AFRICAN AMERICAN: 63 mL/min — AB (ref >90)
Glucose, Bld: 88 mg/dL (ref 70–99)
Potassium: 4.4 mEq/L (ref 3.7–5.3)
Sodium: 137 mEq/L (ref 137–147)

## 2014-04-08 LAB — GLUCOSE, CAPILLARY
Glucose-Capillary: 106 mg/dL — ABNORMAL HIGH (ref 70–99)
Glucose-Capillary: 214 mg/dL — ABNORMAL HIGH (ref 70–99)
Glucose-Capillary: 270 mg/dL — ABNORMAL HIGH (ref 70–99)
Glucose-Capillary: 228 mg/dL — ABNORMAL HIGH (ref 70–99)

## 2014-04-08 MED ORDER — FUROSEMIDE 10 MG/ML IJ SOLN
60.0000 mg | Freq: Two times a day (BID) | INTRAMUSCULAR | Status: DC
  Administered 2014-04-08 – 2014-04-09 (×2): 60 mg via INTRAVENOUS
  Filled 2014-04-08 (×2): qty 6

## 2014-04-08 MED ORDER — IPRATROPIUM-ALBUTEROL 0.5-2.5 (3) MG/3ML IN SOLN: 3.0000 mL | RESPIRATORY_TRACT | Status: DC

## 2014-04-08 MED ORDER — IPRATROPIUM-ALBUTEROL 0.5-2.5 (3) MG/3ML IN SOLN
3.0000 mL | RESPIRATORY_TRACT | Status: DC
  Administered 2014-04-08 – 2014-04-09 (×4): 3 mL via RESPIRATORY_TRACT
  Filled 2014-04-08 (×3): qty 3

## 2014-04-08 MED ORDER — BENZONATATE 100 MG PO CAPS
100.0000 mg | ORAL_CAPSULE | Freq: Two times a day (BID) | ORAL | Status: DC | PRN
  Filled 2014-04-08: qty 1

## 2014-04-08 MED ORDER — FUROSEMIDE 10 MG/ML IJ SOLN
20.0000 mg | Freq: Once | INTRAMUSCULAR | Status: AC
  Administered 2014-04-08: 20 mg via INTRAVENOUS

## 2014-04-08 NOTE — Progress Notes (Signed)
Internal Medicine Attending  Date: 04/08/2014  Patient name: [name] Medical record number: [id number] Date of birth: [birth year] Age: 80 y.o. Gender: female  I saw and evaluated the patient. I reviewed the resident's note by Dr. Zada Girt and I agree with the resident's findings and plans as documented in his progress note.  [name] was dyspneic at rest when seen on rounds this morning.  Her estimated JVP was at least 8 cm H2O.  She continues to have bilateral lower extremity pitting edema.  Although there was a very slight bump in her BUN/Cr her weight remains unchanged and 8 pounds over the estimated dry weight.  At this point, she may benefit from more aggressive IV diuresis.  We will increase the lasix to 60 mg IV BID and reassess her symptomatic response and look for appropriate changes in her examination and improvement or stabilization of her Bun/Cr.  If she does not diurese sufficiently and her BUN/Cr increase she may be experiencing a cardiorenal syndrome, at which point we would consider consulting the heart failure team to consider initiation of inotropic agents.

## 2014-04-08 NOTE — Progress Notes (Signed)
Subjective: [name] is an 80 YO woman with a PMH of asthma, CHF, A fib, DM2, and HLD on HD3 who initially presented with SOB, wheezing, and two brief episodes of chest pain. Overnight, the patient reports that she slept well. However, this morning she complains of a productive cough and worsening SOB that began last night. Sputum was slightly bloody. She received a nebulizer treatment around 0830, which helped ease her SOB somewhat. She denies any chest pain, fever, cough, nausea, or vomiting. She is tolerating a normal diet. She has been urinating frequently without burning or pain. She reports that the Miralax helped her to have two bowel movements yesterday.  Objective: Vital signs in last 24 hours: Filed Vitals:   04/07/14 1411 04/07/14 2120 04/08/14 0638 04/08/14 0752  BP: 121/70 139/72 108/67   Pulse: 101 96 76   Temp: 97.4 F (36.3 C) 97.5 F (36.4 C) 98.6 F (37 C)   TempSrc: Oral Oral Oral   Resp: 20 18 18    Height:      Weight:   71.8 kg (158 lb 4.6 oz)   SpO2: 95% 96% 96% 95%   Weight change: -0.3 kg (-10.6 oz)  Intake/Output Summary (Last 24 hours) at 04/08/14 1056 Last data filed at 04/08/14 0958  Gross per 24 hour  Intake   1910 ml  Output   1550 ml  Net    360 ml   Physical Exam:  Blood pressure 108/67, pulse 76, temperature 98.6 F (37 C), temperature source Oral, resp. rate 18, height 5\' 5"  (1.651 m), weight 71.8 kg (158 lb 4.6 oz), SpO2 95.00%.  General appearance: alert, cooperative, fatigued and moderate distress  Head: Normocephalic, without obvious abnormality, atraumatic  Lungs: clear to auscultation bilaterally  Heart: irregularly irregular rhythm  Abdomen: soft, non-tender; bowel sounds normal; no masses, no organomegaly  Extremities: edema 1+ in L leg, trace in R leg  Skin: Skin color, texture, turgor normal. No rashes or lesions  Lab Results: Basic Metabolic Panel:  Recent Labs Lab 04/07/14 1003 04/07/14 1015 04/08/14 0407  NA 136*  --   137  K 3.5*  --  4.4  CL 96  --  97  CO2 27  --  31  GLUCOSE 272*  --  88  BUN 17  --  22  CREATININE 0.70  --  0.82  CALCIUM 9.1  --  9.1  MG  --  1.7  --   PHOS 2.9  --   --     Recent Labs Lab 04/05/14 0839 04/05/14 0904  WBC 3.2*  --   NEUTROABS 1.6*  --   HGB 12.5 14.6  HCT 38.6 43.0  MCV 91.9  --   PLT 198  --    CBG:  Recent Labs Lab 04/06/14 2148 04/07/14 0547 04/07/14 1113 04/07/14 1641 04/07/14 2142 04/08/14 0815  GLUCAP 224* 290* 237* 387* 152* 106*   Medications: I have reviewed the patient's current medications. Scheduled Meds: . antiseptic oral rinse  7 mL Mouth Rinse BID  . brimonidine  1 drop Left Eye BID  . dorzolamide  1 drop Left Eye BID  . feeding supplement (GLUCERNA SHAKE)  237 mL Oral Q24H  . furosemide  60 mg Intravenous Q12H  . insulin aspart  0-9 Units Subcutaneous TID WC  . lisinopril  20 mg Oral Daily  . metoprolol succinate  50 mg Oral Daily  . mometasone-formoterol  2 puff Inhalation BID  . multivitamin with minerals  1 tablet Oral  Daily  . polyethylene glycol  17 g Oral Daily  . predniSONE  60 mg Oral Q breakfast  . rivaroxaban  20 mg Oral Q supper  . sodium chloride  3 mL Intravenous Q12H   Continuous Infusions:  PRN Meds:.acetaminophen, acetaminophen, benzonatate, ipratropium-albuterol Assessment/Plan: Principal Problem:   Asthma with acute exacerbation in adult Active Problems:   DIABETES MELLITUS, TYPE II   HYPERTENSION   Acute on chronic combined systolic and diastolic congestive heart failure   Atrial fibrillation   Valvular heart disease   Current use of long term anticoagulation--xarelto  Asthma exacerbation:  Patient presented with worsening dyspnea, productive cough and hemoptysis over the past 2 days. CXR revealed cardiomegaly with mild increase in pulmonary vascularity. The L costophrenic angle was blunted with a small effusion noted, suggestive of mild CHF. Her O2 sats have been stable, ranging from 94-100%  on 2L [place]. We will continue to monitor her urine output closely to ensure that she won't become volume depleted with the Lasix treatment. Net I/O from 8/21 was -455 mL. No inspiratory wheezing or crackles appreciated upon exam. Repeat BMP revealed K+ level of 4.4 this morning, which had improved from yesterday. Chest pain has resolved. Patient's cough with sputum production increases our concern for a superimposed pneumonia or pulmonary edema. JVD 7-8 cm. Repeat CXR ordered. Lasix dose was increased in order to better control the patient's volume status. Her weight has been stable since admission, around 71-72 kg. Her creatinine is stable at 0.82, which allows us to be more aggressive with Lasix treatment.  -Transition to Prednisone 60 mg PO bid, with a plan to taper.  -Lasix 40 mg BID IV + 20 mg Lasix IV  -Oxygen via 2.5L [place]  -Continue lisinopril 20 mg daily  -Continue metoprolol XR 50 mg daily  -Duoneb (ipratropium-albuterol) Q4H prn wheezing, shortness of breath  -Dulera (mometasone-formoterol) 2 puffs BID  -Cardiac monitoring  -Daily weights  -Cardiac Diet  -I/Os  -Orthostatic vital signs  -Troponins negative x 3   Chest Pain: Patient complains of chest pain that occurred once 2 days prior to admission and once one day prior to admission. EKG shows T wave inversion in leads aVL, V1, V5, and V6 similar to prior and non-specific questionable ST elevation in lead V3. Patient has not had additional episodes of chest pain since hospitalization. Chest pain is most likely due to recent onset of dyspnea and tachypnea. Chest pain has resolved.  -Troponins negative x 3  -Cardiac monitoring   Permanent Atrial Fibrillation: Patient is on metoprolol 50 mg daily and Xarelto 20 mg daily at home. On exam, patient has an irregularly irregular rhythm. EKG shows atrial fibrillation with RVR, 132 bpm. Patient became less tachycardic later in the evening after metoprolol was resumed. Tachycardia likely due to  nebulizer treatments.  -Continue metoprolol 50 mg daily  -Xarelto 20 mg daily   DM type II: Patient has a history of DM type II. Patient is on metformin 1 gram BID. She states her glucose is well controlled at home, 110s to 120s. Her most recent HgbA1C was 6.7 on 11/24/13. At 0547 today, her finger stick glucose was 290.  -SSI-sensitive  -CBG checks 4 times daily  -Cardiac diet   Graves disease s/p thyroidectomy: Patient has a history of Graves disease s/p thyroidectomy. Her most recent TSH was normal at 1.410 on 11/24/13. It appears her TSH levels have been WNL since 2008.  -Stable   HLD: Lipid panel on 8/20 showed cholesterol 129, TG  52, HDL 54, and LDL 65. Patient is not currently on a statin at home.   DVT/PE ppx: Xarelto 20 mg daily   Dispo: Disposition is deferred at this time, awaiting improvement of new onset productive cough with worsened wheezing. Anticipate discharge in 2-3 days.   The patient does have a current PCP Yolanda Manges, DO) and does need an [facility] hospital follow-up appointment after discharge.   The patient does not have transportation limitations that hinder transportation to clinic appointments.   This is a Psychologist, occupational Note.  The care of the patient was discussed with Dr. Paticia Stack and the assessment and plan formulated with their assistance.  Please see their attached note for official documentation of the daily encounter.   LOS: 3 days   Alford Highland, Med Student 04/08/2014, 10:56 AM

## 2014-04-08 NOTE — Progress Notes (Signed)
Subjective: She reports to be more short of breath this morning. When we saw her this am, she had just received nebulizing treatments 30 minutes prior but she was still short winded. She has increased cough with sputum production. She feels worse than she did yesterday. BUN slightly elevated on diuretics from 17 to 22.  Her weight is unchanged at 158 pounds.  Objective: Vital signs in last 24 hours: Filed Vitals:   04/07/14 1411 04/07/14 2120 04/08/14 0638 04/08/14 0752  BP: 121/70 139/72 108/67   Pulse: 101 96 76   Temp: 97.4 F (36.3 C) 97.5 F (36.4 C) 98.6 F (37 C)   TempSrc: Oral Oral Oral   Resp: 20 18 18    Height:      Weight:   158 lb 4.6 oz (71.8 kg)   SpO2: 95% 96% 96% 95%   Weight change: -10.6 oz (-0.3 kg)  Intake/Output Summary (Last 24 hours) at 04/08/14 1026 Last data filed at 04/08/14 0958  Gross per 24 hour  Intake   1910 ml  Output   1550 ml  Net    360 ml    General: Vital signs reviewed. Mild to moderate distress.  Lungs:  Poor air movement bilaterally without significant wheezing.  Heart: irregular HR, no extra sounds or murmurs, elevated JVP with an estimated CVP of 7-8 cm of water  Abdomen: Bowel sounds present, soft, nontender; no hepatosplenomegaly  Extremities: Bilateral pitting edema in the lower extremities. Discoloration of her feet bilaterally. Neurologic: Alert and oriented x3. Moves all extremities, seated in chair  Lab Results: Basic Metabolic Panel:  Recent Labs Lab 04/07/14 1003 04/07/14 1015 04/08/14 0407  NA 136*  --  137  K 3.5*  --  4.4  CL 96  --  97  CO2 27  --  31  GLUCOSE 272*  --  88  BUN 17  --  22  CREATININE 0.70  --  0.82  CALCIUM 9.1  --  9.1  MG  --  1.7  --   PHOS 2.9  --   --    Liver Function Tests:  Recent Labs Lab 04/05/14 1140  AST 50*  ALT 8  ALKPHOS 86  BILITOT 1.4*  PROT 7.4  ALBUMIN 3.5   CBC:  Recent Labs Lab 04/05/14 0839 04/05/14 0904  WBC 3.2*  --   NEUTROABS 1.6*  --   HGB  12.5 14.6  HCT 38.6 43.0  MCV 91.9  --   PLT 198  --    Cardiac Enzymes:  Recent Labs Lab 04/05/14 1359 04/05/14 1600 04/05/14 2300  TROPONINI <0.30 <0.30 <0.30   BNP:  Recent Labs Lab 04/05/14 0855  PROBNP 4201.0*   D-Dimer: No results found for this basename: DDIMER,  in the last 168 hours CBG:  Recent Labs Lab 04/06/14 2148 04/07/14 0547 04/07/14 1113 04/07/14 1641 04/07/14 2142 04/08/14 0815  GLUCAP 224* 290* 237* 387* 152* 106*   Hemoglobin A1C:  Recent Labs Lab 04/05/14 1140  HGBA1C 7.9*   Fasting Lipid Panel:  Recent Labs Lab 04/06/14 0500  CHOL 129  HDL 54  LDLCALC 65  TRIG 52  CHOLHDL 2.4    Micro Results: No results found for this or any previous visit (from the past 240 hour(s)). Studies/Results: No results found. Medications: I have reviewed the patient's current medications. Scheduled Meds: . antiseptic oral rinse  7 mL Mouth Rinse BID  . brimonidine  1 drop Left Eye BID  . dorzolamide  1 drop Left  Eye BID  . feeding supplement (GLUCERNA SHAKE)  237 mL Oral Q24H  . furosemide  20 mg Intravenous Once  . furosemide  60 mg Intravenous Q12H  . insulin aspart  0-9 Units Subcutaneous TID WC  . lisinopril  20 mg Oral Daily  . metoprolol succinate  50 mg Oral Daily  . mometasone-formoterol  2 puff Inhalation BID  . multivitamin with minerals  1 tablet Oral Daily  . polyethylene glycol  17 g Oral Daily  . predniSONE  60 mg Oral Q breakfast  . rivaroxaban  20 mg Oral Q supper  . sodium chloride  3 mL Intravenous Q12H   Continuous Infusions:  PRN Meds:.acetaminophen, acetaminophen, benzonatate, ipratropium-albuterol Assessment/Plan: Principal Problem:   Asthma with acute exacerbation in adult Active Problems:   DIABETES MELLITUS, TYPE II   HYPERTENSION   Acute on chronic combined systolic and diastolic congestive heart failure   Atrial fibrillation   Valvular heart disease   Current use of long term anticoagulation--xarelto    Acute combined CHF exacerbation: With a component of asthma exacerbation. Echocardiogram from January 2015 with EF of 40% to 45% inaddition to grade 3 diastolic dysfunction. PA pressures > 60 mmHg. She has not gotten better over the past 2 days with continuing IV diuresis with Lasix 40 mg IV twice a day. She appears to be doing worse today. Physical exam reveals some increased volume, with CVP estimated to be 7-8 cm of water. Weight is unchanged at 158. Estimated dry weight is about 150 pounds. She is therefore about at least 7 pounds up. She is more dyspneic compared to yesterday. Her wheezing has previously improved with nebulizing treatment, which will plan to continue. She is trying to work with PT transferring to a chair. She denies active chest pain. Admission EKG showed T wave inversion in leads aVL, V1, V5, and V6 similar to prior and non-specific questionable ST elevation in lead V3. Troponins have been negative x3. ProBNP elevated at 4200. Plan -Given her poor response in terms of her volume status, will increase Lasix to 60 mg twice a day. Will give an additional IV Lasix 20 mg for this morning. BUN only slightly elevated, which provides room for more diuresis. - Obtain a two-view chest x-ray to rule out pneumonia given her increased cough, and sputum production. However, she's been afebrile and without leukocytosis. All  this may be related to pulmonary edema. - Continue lisinopril 20 mg daily  - Continue metoprolol XR 50 mg daily  - Duoneb (ipratropium-albuterol) Q4H prn wheezing, shortness of breath  - Dulera (mometasone-formoterol) 2 puffs BID  - Cardiac monitoring  - closely monitor renal function and electrolytes and replace as needed while she is on diuretics. Potassium and magnesium now normalized. - Daily weights  - Cardiac Diet  - Strict ins and outs. She appears to be only -400 cc since admission. -Oxygen via [place] 2L  -will continue with the by mouth's, prednisone 60 mg daily,  with a plan for taper - due to poor clinical response, discharge will be delayed for at least an additional day.   Permanent Atrial Fibrillation: Patient is on metoprolol 50 mg daily and Xarelto 20 mg daily at home. On admission, patient was irregularly irregular and tachycardic. EKG showed atrial fibrillation with RVR, HR 132 bpm.  Heart rate currently well controlled. Plan -Continue metoprolol 50 mg daily -Xarelto 20 mg daily  -telemetry    DM type II: HgbA1C was 6.7 on 11/24/13. Started on steroids. CBGs are currently  better. -Lantus 10 Units -SSI-sensitive  -CBG checks 4 times daily  -Cardiac diet   DVT/PE ppx: Xarelto 20 mg daily.  Dispo: Disposition is deferred at this time, awaiting improvement of current medical problems. She is likely to stay over the weekend until her volume status is stabilized.   The patient does have a current PCP Yolanda Manges(Alex M Wilson, DO) and does need an [facility] hospital follow-up appointment after discharge.  The patient does not have transportation limitations that hinder transportation to clinic appointments.  .Services Needed at time of discharge: Y = Yes, Blank = No  PT:    OT:    RN:    Equipment:    Other:       LOS: 3 days   Dow AdolphKazibwe, Saadia Dewitt PGY 3 - Internal Medicine Teaching Service Pager: [phone] 04/08/2014, 10:26 AM

## 2014-04-08 NOTE — Progress Notes (Deleted)
Date: 04/08/2014               Patient Name:  [name] MRN: [id number]  DOB: 05/14/1926 Age / Sex: 80 y.o., female   PCP: Yolanda Manges, DO              Medical Service: Internal Medicine Teaching Service              Attending Physician: Dr. Rocco Serene, MD    First Contact: Alford Highland, MS 3 Pager: [phone]  Second Contact: Dr. Senaida Ores Pager: [phone]  Third Contact Dr. Zada Girt Pager: [phone]       After Hours (After 5p/  First Contact Pager: [phone]  weekends / holidays): Second Contact Pager: [phone]   Chief Complaint:"I'm feeling worse this morning."  History of Present Illness: [name] is an 80 YO woman with a PMH of asthma, CHF, A fib, DM2, and HLD on HD3 who initially presented with SOB, wheezing, and two brief episodes of chest pain. Overnight, the patient reports that she slept well. However, this morning she complains of a productive cough and worsening SOB that began last night. Sputum was slightly bloody. She received a nebulizer treatment around 0830, which helped ease her SOB somewhat. She denies any chest pain, fever, cough, nausea, or vomiting. She is tolerating a normal diet. She has been urinating frequently without burning or pain. She reports that the Miralax helped her to have two bowel movements yesterday.  Meds: Current Facility-Administered Medications  Medication Dose Route Frequency Provider Last Rate Last Dose  . acetaminophen (TYLENOL) tablet 650 mg  650 mg Oral Q6H PRN Baltazar Apo, MD       Or  . acetaminophen (TYLENOL) suppository 650 mg  650 mg Rectal Q6H PRN Baltazar Apo, MD      . antiseptic oral rinse (CPC / CETYLPYRIDINIUM CHLORIDE 0.05%) solution 7 mL  7 mL Mouth Rinse BID Aletta Edouard, MD   7 mL at 04/07/14 2300  . benzonatate (TESSALON) capsule 100 mg  100 mg Oral BID PRN Dow Adolph, MD      . brimonidine (ALPHAGAN) 0.2 % ophthalmic solution 1 drop  1 drop Left Eye BID Aletta Edouard, MD   1 drop at 04/07/14  2039  . dorzolamide (TRUSOPT) 2 % ophthalmic solution 1 drop  1 drop Left Eye BID Baltazar Apo, MD   1 drop at 04/07/14 2300  . feeding supplement (GLUCERNA SHAKE) (GLUCERNA SHAKE) liquid 237 mL  237 mL Oral Q24H Lorraine Lax, RD   237 mL at 04/07/14 1400  . furosemide (LASIX) injection 20 mg  20 mg Intravenous Once Dow Adolph, MD      . furosemide (LASIX) injection 60 mg  60 mg Intravenous Q12H Dow Adolph, MD      . insulin aspart (novoLOG) injection 0-9 Units  0-9 Units Subcutaneous TID WC Baltazar Apo, MD   9 Units at 04/07/14 1753  . ipratropium-albuterol (DUONEB) 0.5-2.5 (3) MG/3ML nebulizer solution 3 mL  3 mL Nebulization Q4H PRN Baltazar Apo, MD   3 mL at 04/08/14 0745  . lisinopril (PRINIVIL,ZESTRIL) tablet 20 mg  20 mg Oral Daily Baltazar Apo, MD   20 mg at 04/07/14 1142  . metoprolol succinate (TOPROL-XL) 24 hr tablet 50 mg  50 mg Oral Daily Baltazar Apo, MD   50 mg at 04/07/14 1142  . mometasone-formoterol (DULERA) 100-5 MCG/ACT inhaler 2 puff  2 puff Inhalation BID Baltazar Apo, MD  2 puff at 04/08/14 0751  . multivitamin with minerals tablet 1 tablet  1 tablet Oral Daily Lorraine Laxeanne J Barnett, RD   1 tablet at 04/07/14 1142  . polyethylene glycol (MIRALAX / GLYCOLAX) packet 17 g  17 g Oral Daily Jill AlexandersAlexa Richardson, MD   17 g at 04/07/14 1142  . predniSONE (DELTASONE) tablet 60 mg  60 mg Oral Q breakfast Jill AlexandersAlexa Richardson, MD   60 mg at 04/08/14 0650  . rivaroxaban (XARELTO) tablet 20 mg  20 mg Oral Q supper Baltazar ApoSamaya J Qureshi, MD   20 mg at 04/07/14 1755  . sodium chloride 0.9 % injection 3 mL  3 mL Intravenous Q12H Baltazar ApoSamaya J Qureshi, MD   3 mL at 04/07/14 2300    Allergies: Allergies as of 04/05/2014  . (No Known Allergies)   Past Medical History  Diagnosis Date  . Hypertension   . Diabetes mellitus   . Asthma   . Osteoarthritis     left shoulder  . Pseudogout   . Hypokalemia     a. Previously felt due to diuretics, req supplementation.  .  Churg-Strauss syndrome     a. Sural nerve biopsy 02/2000.  Darene Lamer. Grave's disease     a. s/p thyroidectomy.  . Glaucoma   . Hypercholesteremia   . Venous insufficiency   . Osteopenia     DEXA 2004  . Lipoma     left inner thigh  . Cataract     left eye  . VASCULITIS   . Chronic combined systolic and diastolic heart failure     a. 1/32447/2013 EF:20-25%  . Pleural effusion, left     a. Thoracentesis 2001 - per notes, no malignant cells, was transudative.  Marland Kitchen. NSVT (nonsustained ventricular tachycardia)     a. During CHF adm 2001.  . Multifocal atrial tachycardia     a. Documented as OP 01/2012.  Marland Kitchen. Ectopic atrial tachycardia     a. Documented on tele as IP 02/2012.  . Pulmonary HTN     a. Mod by echo 02/2012.  . Valvular heart disease     a. Echo 02/2012: mod MR/TR, mild AI.  Marland Kitchen. Pericardial effusion     a. Echo 02/2012: small-mod pericardial effusion.  . Atrial fibrillation, permanent     a. Dx 04/2013, on metoprolol, dig and xarelto  . Shortness of breath    Past Surgical History  Procedure Laterality Date  . Abdominal hysterectomy    . Salpingoophorectomy    . Thyroidectomy    . Hemiarthroplasty hip Right 10/2010    cemented for hip fracture, femoral neck - by Eulas PostJoshua P Landau, MD  . Cataract extraction Right   . Hemiarthroplasty hip     Family History  Problem Relation Age of Onset  . Diabetes Mother   . Diabetes Father   . Heart disease Neg Hx    History   Social History  . Marital Status: Widowed    Spouse Name: N/A    Number of Children: N/A  . Years of Education: N/A   Occupational History  . Retired    Social History Main Topics  . Smoking status: Never Smoker   . Smokeless tobacco: Never Used  . Alcohol Use: No  . Drug Use: No  . Sexual Activity: Not on file   Other Topics Concern  . Not on file   Social History Narrative   Lives in house, with family just next door.    Never smoker, no alcohol.  Used to work at [employer].    Before  that, worked in food prep.                   Review of Systems: Constitutional: positive for fatigue Eyes: positive for irritation with watery discharge Respiratory: positive for asthma, cough, sputum and wheezing Cardiovascular: negative for chest pain Gastrointestinal: negative for constipation and nausea Hematologic/lymphatic: negative for bleeding  Physical Exam: Blood pressure 108/67, pulse 76, temperature 98.6 F (37 C), temperature source Oral, resp. rate 18, height 5\' 5"  (1.651 m), weight 71.8 kg (158 lb 4.6 oz), SpO2 95.00%. General appearance: alert, cooperative, fatigued and moderate distress Head: Normocephalic, without obvious abnormality, atraumatic Lungs: clear to auscultation bilaterally Heart: irregularly irregular rhythm Abdomen: soft, non-tender; bowel sounds normal; no masses,  no organomegaly Extremities: edema 1+ in L leg, trace in R leg Skin: Skin color, texture, turgor normal. No rashes or lesions  Lab results: Basic Metabolic Panel:  Recent Labs  16/10/96 1003 04/07/14 1015 04/08/14 0407  NA 136*  --  137  K 3.5*  --  4.4  CL 96  --  97  CO2 27  --  31  GLUCOSE 272*  --  88  BUN 17  --  22  CREATININE 0.70  --  0.82  CALCIUM 9.1  --  9.1  MG  --  1.7  --   PHOS 2.9  --   --    Liver Function Tests:  Recent Labs  04/05/14 1140  AST 50*  ALT 8  ALKPHOS 86  BILITOT 1.4*  PROT 7.4  ALBUMIN 3.5    Recent Labs  04/06/14 2148 04/07/14 0547 04/07/14 1113 04/07/14 1641 04/07/14 2142 04/08/14 0815  GLUCAP 224* 290* 237* 387* 152* 106*    Imaging results:  CXR pending  Assessment & Plan by Problem: Principal Problem:   Asthma with acute exacerbation in adult Active Problems:   DIABETES MELLITUS, TYPE II   HYPERTENSION   Acute on chronic combined systolic and diastolic congestive heart failure   Atrial fibrillation   Valvular heart disease   Current use of long term anticoagulation--xarelto  Asthma exacerbation: Patient  presented with worsening dyspnea, productive cough and hemoptysis over the past 2 days. CXR revealed cardiomegaly with mild increase in pulmonary vascularity. The L costophrenic angle was blunted with a small effusion noted, suggestive of mild CHF. Her O2 sats have been stable, ranging from 94-100% on 2L [place]. We will continue to monitor her urine output closely to ensure that she won't become volume depleted with the Lasix treatment. Net I/O from 8/21 was -455 mL. No inspiratory wheezing or crackles appreciated upon exam. Repeat BMP revealed K+ level of 4.4 this morning, which had improved from yesterday. Chest pain has resolved. Patient's cough with sputum production increases our concern for a superimposed pneumonia or pulmonary edema. JVD 7-8 cm. Repeat CXR ordered. Lasix dose was increased in order to better control the patient's volume status. Her weight has been stable since admission, around 71-72 kg. Her creatinine is stable at 0.82, which allows [place] to be more aggressive with Lasix treatment. -Transition to Prednisone 60 mg PO bid, with a plan to taper. -Lasix 40 mg BID IV + 20 mg Lasix IV -Oxygen via 2.5L [place]  -Continue lisinopril 20 mg daily  -Continue metoprolol XR 50 mg daily  -Duoneb (ipratropium-albuterol) Q4H prn wheezing, shortness of breath  -Dulera (mometasone-formoterol) 2 puffs BID  -Cardiac monitoring  -Daily weights  -Cardiac Diet  -  I/Os  -Orthostatic vital signs  -Troponins negative x 3   Chest Pain: Patient complains of chest pain that occurred once 2 days prior to admission and once one day prior to admission. EKG shows T wave inversion in leads aVL, V1, V5, and V6 similar to prior and non-specific questionable ST elevation in lead V3. Patient has not had additional episodes of chest pain since hospitalization. Chest pain is most likely due to recent onset of dyspnea and tachypnea. Chest pain has resolved. -Troponins negative x 3  -Cardiac monitoring   Permanent Atrial  Fibrillation: Patient is on metoprolol 50 mg daily and Xarelto 20 mg daily at home. On exam, patient has an irregularly irregular rhythm. EKG shows atrial fibrillation with RVR, 132 bpm. Patient became less tachycardic later in the evening after metoprolol was resumed. Tachycardia likely due to nebulizer treatments.  -Continue metoprolol 50 mg daily  -Xarelto 20 mg daily   DM type II: Patient has a history of DM type II. Patient is on metformin 1 gram BID. She states her glucose is well controlled at home, 110s to 120s. Her most recent HgbA1C was 6.7 on 11/24/13. At 0547 today, her finger stick glucose was 290.  -SSI-sensitive  -CBG checks 4 times daily  -Cardiac diet   Graves disease s/p thyroidectomy: Patient has a history of Graves disease s/p thyroidectomy. Her most recent TSH was normal at 1.410 on 11/24/13. It appears her TSH levels have been WNL since 2008.  -Stable   HLD: Lipid panel on 8/20 showed cholesterol 129, TG 52, HDL 54, and LDL 65. Patient is not currently on a statin at home.   DVT/PE ppx: Xarelto 20 mg daily   Dispo: Disposition is deferred at this time, awaiting improvement of new onset productive cough with worsened wheezing. Anticipate discharge in 2-3 days.  The patient does have a current PCP Yolanda Manges, DO) and does need an [facility] hospital follow-up appointment after discharge.   The patient does not have transportation limitations that hinder transportation to clinic appointments.  This is a Psychologist, occupational Note.  The care of the patient was discussed with Dr. Paticia Stack and the assessment and plan was formulated with their assistance.  Please see their note for official documentation of the patient encounter.   Signed: Alford Highland, Med Student 04/08/2014, 10:20 AM

## 2014-04-09 DIAGNOSIS — I509 Heart failure, unspecified: Secondary | ICD-10-CM

## 2014-04-09 DIAGNOSIS — E876 Hypokalemia: Secondary | ICD-10-CM

## 2014-04-09 DIAGNOSIS — I5043 Acute on chronic combined systolic (congestive) and diastolic (congestive) heart failure: Principal | ICD-10-CM

## 2014-04-09 LAB — BASIC METABOLIC PANEL
Anion gap: 10 (ref 5–15)
BUN: 20 mg/dL (ref 6–23)
CHLORIDE: 98 meq/L (ref 96–112)
CO2: 32 mEq/L (ref 19–32)
Calcium: 8.4 mg/dL (ref 8.4–10.5)
Creatinine, Ser: 0.7 mg/dL (ref 0.50–1.10)
GFR calc Af Amer: 88 mL/min — ABNORMAL LOW (ref >90)
GFR calc non Af Amer: 76 mL/min — ABNORMAL LOW (ref >90)
GLUCOSE: 134 mg/dL — AB (ref 70–99)
POTASSIUM: 3.9 meq/L (ref 3.7–5.3)
SODIUM: 140 meq/L (ref 137–147)

## 2014-04-09 LAB — GLUCOSE, CAPILLARY
GLUCOSE-CAPILLARY: 235 mg/dL — AB (ref 70–99)
GLUCOSE-CAPILLARY: 427 mg/dL — AB (ref 70–99)
GLUCOSE-CAPILLARY: 170 mg/dL — AB (ref 70–99)
Glucose-Capillary: 131 mg/dL — ABNORMAL HIGH (ref 70–99)
Glucose-Capillary: 167 mg/dL — ABNORMAL HIGH (ref 70–99)

## 2014-04-09 MED ORDER — IPRATROPIUM-ALBUTEROL 0.5-2.5 (3) MG/3ML IN SOLN
3.0000 mL | Freq: Four times a day (QID) | RESPIRATORY_TRACT | Status: DC
  Administered 2014-04-09 – 2014-04-10 (×3): 3 mL via RESPIRATORY_TRACT
  Filled 2014-04-09 (×4): qty 3

## 2014-04-09 MED ORDER — POLYETHYLENE GLYCOL 3350 17 G PO PACK
17.0000 g | PACK | Freq: Every day | ORAL | Status: DC | PRN
  Filled 2014-04-09: qty 1

## 2014-04-09 MED ORDER — INSULIN GLARGINE 100 UNIT/ML ~~LOC~~ SOLN
15.0000 [IU] | Freq: Every day | SUBCUTANEOUS | Status: DC
Start: 2014-04-09 — End: 2014-04-13
  Administered 2014-04-09 – 2014-04-12 (×4): 15 [IU] via SUBCUTANEOUS
  Filled 2014-04-09 (×5): qty 0.15

## 2014-04-09 MED ORDER — FUROSEMIDE 10 MG/ML IJ SOLN
80.0000 mg | Freq: Two times a day (BID) | INTRAMUSCULAR | Status: DC
  Administered 2014-04-09 – 2014-04-11 (×4): 80 mg via INTRAVENOUS
  Filled 2014-04-09 (×5): qty 8

## 2014-04-09 MED ORDER — METOLAZONE 2.5 MG PO TABS
2.5000 mg | ORAL_TABLET | Freq: Every day | ORAL | Status: DC
  Administered 2014-04-09 – 2014-04-11 (×3): 2.5 mg via ORAL
  Filled 2014-04-09 (×4): qty 1

## 2014-04-09 MED ORDER — INSULIN GLARGINE 100 UNIT/ML ~~LOC~~ SOLN
10.0000 [IU] | Freq: Every day | SUBCUTANEOUS | Status: DC
Start: 2014-04-09 — End: 2014-04-09
  Filled 2014-04-09: qty 0.1

## 2014-04-09 MED ORDER — INSULIN ASPART 100 UNIT/ML ~~LOC~~ SOLN
12.0000 [IU] | Freq: Once | SUBCUTANEOUS | Status: AC
  Administered 2014-04-09: 12 [IU] via SUBCUTANEOUS

## 2014-04-09 NOTE — Significant Event (Signed)
Glucose of 427 called to MD orders received.

## 2014-04-09 NOTE — Progress Notes (Addendum)
Subjective:  Patient was seen and examined this morning. Patient states she feel short of breath off and on. At home, she normally can complete chores and walk around without feeling short of breath. Occasionally during the day or at night she will feel short of breath, but using her inhaler resolves her symptoms. She does not feel like she is at her baseline. She also admits to increased weight. She admit to wheezing off and on and a productive cough with sputum mixed with blood and sputum. She didn't sleep well last night due to increased urination. She denies any fever, chills, chest pain, nausea or vomiting.   Objective: Vital signs in last 24 hours: Filed Vitals:   04/08/14 2324 04/09/14 0406 04/09/14 0429 04/09/14 0747  BP:   128/64   Pulse:   99   Temp:   97 F (36.1 C)   TempSrc:   Axillary   Resp:   18   Height:      Weight:   156 lb 15.5 oz (71.2 kg)   SpO2: 95% 96% 94% 96%   Weight change: -1 lb 5.2 oz (-0.6 kg)  Intake/Output Summary (Last 24 hours) at 04/09/14 0955 Last data filed at 04/09/14 1610  Gross per 24 hour  Intake   1080 ml  Output   1753 ml  Net   -673 ml    General: Vital signs reviewed. Patient is an elderly female in no acute distress and cooperative with exam.  Neck: JVD 6-8 cm, no carotid bruit present.  Cardiovascular: Irregularly irregular, tachycardic, 2/6 blowing holosystolic murmur heard best at the apex.  Pulmonary/Chest: Clear to auscultation bilaterally, no rhonchi, no wheezing or rales. No accessory muscle use. Abdominal: Soft, non-tender, non-distended, BS +, no suprapubic tenderness. Musculoskeletal: No joint deformities, erythema, or stiffness, ROM full and nontender.  Extremities: 2+ pitting edema in her left lower extremity extending up to mid-shin, 1+ pitting edema in her right lower extremity extending to her mid-shin, no calf tenderness, pulses symmetric and intact bilaterally.  Neurological: AA&O x3, no focal motor deficit, sensory  intact to light touch bilaterally.  Skin: Warm, dry and intact. No rashes or erythema. Psychiatric: Normal mood and affect. speech and behavior is normal. Cognition and memory are normal.   Lab Results: Basic Metabolic Panel:  Recent Labs Lab 04/07/14 1003 04/07/14 1015 04/08/14 0407 04/09/14 0801  NA 136*  --  137 140  K 3.5*  --  4.4 3.9  CL 96  --  97 98  CO2 27  --  31 32  GLUCOSE 272*  --  88 134*  BUN 17  --  22 20  CREATININE 0.70  --  0.82 0.70  CALCIUM 9.1  --  9.1 8.4  MG  --  1.7  --   --   PHOS 2.9  --   --   --    Liver Function Tests:  Recent Labs Lab 04/05/14 1140  AST 50*  ALT 8  ALKPHOS 86  BILITOT 1.4*  PROT 7.4  ALBUMIN 3.5   CBC:  Recent Labs Lab 04/05/14 0839 04/05/14 0904  WBC 3.2*  --   NEUTROABS 1.6*  --   HGB 12.5 14.6  HCT 38.6 43.0  MCV 91.9  --   PLT 198  --    Cardiac Enzymes:  Recent Labs Lab 04/05/14 1359 04/05/14 1600 04/05/14 2300  TROPONINI <0.30 <0.30 <0.30   BNP:  Recent Labs Lab 04/05/14 0855  PROBNP 4201.0*   CBG:  Recent Labs Lab 04/07/14 2142 04/08/14 0815 04/08/14 1146 04/08/14 1616 04/08/14 2113 04/09/14 0602  GLUCAP 152* 106* 214* 270* 228* 131*   Hemoglobin A1C:  Recent Labs Lab 04/05/14 1140  HGBA1C 7.9*   Studies/Results: Dg Chest 2 View  04/08/2014   CLINICAL DATA:  Two-day history of progressive shortness of breath; history of asthma and atrial fibrillation  EXAM: CHEST  2 VIEW  COMPARISON:  Portable chest x-ray of April 05, 2014  FINDINGS: The lungs are adequately inflated. The interstitial markings are mildly prominent though stable. The cardiopericardial silhouette is quite enlarged. The central pulmonary vascularity is prominent. The left hemidiaphragm is partially obscured. A small amount of pleural fluid is present posteriorly in the costophrenic gutters. The bony thorax exhibits no acute abnormality.  IMPRESSION: CHF with mild interstitial edema. There may be a pericardial  effusion.   Electronically Signed   By: David  [place]   On: 04/08/2014 17:11   Medications: I have reviewed the patient's current medications.  Prescriptions prior to admission  Medication Sig Dispense Refill  . albuterol (PROVENTIL HFA;VENTOLIN HFA) 108 (90 BASE) MCG/ACT inhaler Inhale 1-2 puffs into the lungs every 6 (six) hours as needed for shortness of breath.  18 g  11  . brimonidine (ALPHAGAN P) 0.1 % SOLN Place 1 drop into the left eye every 12 (twelve) hours.  15 mL  1  . dorzolamide (TRUSOPT) 2 % ophthalmic solution Place 1 drop into the left eye 2 (two) times daily.       . Fluticasone-Salmeterol (ADVAIR) 250-50 MCG/DOSE AEPB Inhale 1 puff into the lungs 2 (two) times daily.  60 each  11  . furosemide (LASIX) 40 MG tablet Take 1 tablet (40 mg total) by mouth 2 (two) times daily.  60 tablet  2  . lisinopril (PRINIVIL,ZESTRIL) 20 MG tablet Take 1 tablet (20 mg total) by mouth daily.  30 tablet  3  . metFORMIN (GLUCOPHAGE) 1000 MG tablet Take 1 tablet (1,000 mg total) by mouth 2 (two) times daily with a meal.  60 tablet  3  . metoprolol succinate (TOPROL-XL) 50 MG 24 hr tablet Take 1 tablet (50 mg total) by mouth daily. Take with or immediately following a meal.  30 tablet  3  . potassium chloride SA (K-DUR,KLOR-CON) 20 MEQ tablet Take 2 tablets (40 mEq total) by mouth daily.  60 tablet  2  . rivaroxaban (XARELTO) 20 MG TABS tablet Take 1 tablet (20 mg total) by mouth every morning.  30 tablet  3    Scheduled Meds: . antiseptic oral rinse  7 mL Mouth Rinse BID  . brimonidine  1 drop Left Eye BID  . dorzolamide  1 drop Left Eye BID  . feeding supplement (GLUCERNA SHAKE)  237 mL Oral Q24H  . furosemide  80 mg Intravenous Q12H  . insulin aspart  0-9 Units Subcutaneous TID WC  . insulin glargine  10 Units Subcutaneous QHS  . ipratropium-albuterol  3 mL Nebulization QID  . lisinopril  20 mg Oral Daily  . metolazone  2.5 mg Oral Daily  . metoprolol succinate  50 mg Oral Daily  .  mometasone-formoterol  2 puff Inhalation BID  . multivitamin with minerals  1 tablet Oral Daily  . predniSONE  60 mg Oral Q breakfast  . rivaroxaban  20 mg Oral Q supper  . sodium chloride  3 mL Intravenous Q12H   Continuous Infusions:  PRN Meds:.acetaminophen, acetaminophen, benzonatate, polyethylene glycol  Assessment/Plan: Mild Asthma Exacerbation with concomitant  combined CHF exacerbation: Patient presented with worsening dyspnea, productive cough and hemoptysis 4 days ago. Repeat CXR showed CHF with mild interstitial edema and possible pericardial effusion. Oxygen saturation overnight has ranged from 92-100% on 2L [place] via [place]. Patient desaturates to 87% while ambulating. Net I/Os from 8/22 was -312. BUN/Cr today is 20/0.70. Dry weight appears to be 152 lbs from previous clinic visits. Most recent hospital weight is 156 lbs. Today, patient was dyspneic, but not in distress. No crackles or wheezes auscultated. We consulted Cardiology for Heart Failure management and Dr. Gala Romney recommended increasing Lasix to 80 mg IV BID and adding Metolazone 2.5 mg QD. -Continue lisinopril 20 mg daily  -Continue metoprolol XR 50 mg daily  -Duoneb (ipratropium-albuterol) Q4H prn wheezing, shortness of breath  -Dulera (mometasone-formoterol) 2 puffs BID  -Cardiac monitoring  -Daily weights  -Cardiac Diet  -I/Os  -Oxygen via [place] 2L  -Lasix 80 mg IV BID -Metolazone 2.5 mg QD for 2 days -Continue aggressive diuresis and monitor BUN/Cr -Heart Failure Team following -Echocardiogram  Permanent Atrial Fibrillation: Patient is on metoprolol 50 mg daily and Xarelto 20 mg daily at home. HR normally 80-120s, episodes up to 150s-160s.  -Continue metoprolol 50 mg daily -We could consider increasing metoprolol, but we should do this very carefully. Cardiology stated patient had episodes of bradycardia during admission in January. -Xarelto 20 mg daily with dinner -Cardiac monitoring  DM type II: Patient is on  metformin 1 gram BID at home. Most recent HgbA1C was 6.7 on 11/24/13. Glucose has ranged 100-270 over the past 2 days, likely contributed to by steroid treatment. Blood glucose is highest around 4-5 pm.  -Lantus 10 Units QHS -SSI-sensitive  -CBG checks 4 times daily  -Cardiac diet   Hypokalemia: Patient's potassium was 3.9 today. Patient has chronically low-normal potassium, baseline ~3.6, most likely contributed to by Lasix at home. Magnesium and phosphorus are normal. -Resolved -We will consider starting her on potassium supplementation for home  Chest Pain: Patient complained of chest pain that occurred once 2 days prior to admission and once one day prior to admission.  EKG shows T wave inversion in leads aVL, V1, V5, and V6 similar to prior and non-specific questionable ST elevation in lead V3. Chest pain was most likely due to recent onset of dyspnea and tachypnea. Troponins have been negative x 3. Repeat EKG was similar to prior. Patient has not had recurrent chest pain. -Cardiac monitoring  -Resolved  Graves disease s/p thyroidectomy: Patient has a history of Graves disease s/p thyroidectomy. Her most recent TSH was normal at 1.410 on 11/24/13. It appears her TSH levels have been WNL since 2008.  -Stable   HLD: Most recent lipid panel on 03/16/13 showed cholesterol 132, TG 74, HDL 54, and LDL 63. Patient is not currently on a statin at home. Repeat lipid panel shows cholesterol 129, TG 52, HDL 54, LDL 65.  -Stable  DVT/PE ppx: Xarelto 20 mg daily  Dispo: Disposition is deferred at this time, awaiting improvement of current medical problems.  Anticipated discharge in approximately 1-2 day(s).   The patient does have a current PCP Yolanda Manges, DO) and does need an [facility] hospital follow-up appointment after discharge.  The patient does not have transportation limitations that hinder transportation to clinic appointments.  .Services Needed at time of discharge: Y = Yes, Blank = No PT:    OT:   RN:   Equipment:   Other:     LOS: 4 days   Jill Alexanders,  DO PGY-1 Internal Medicine Resident Pager # [phone] 04/09/2014 9:55 AM

## 2014-04-09 NOTE — Progress Notes (Signed)
I have seen the patient and reviewed the daily progress note by Alford Highland, MS III and discussed the care of the patient with them.  Please see note for my findings, assessment, and plans/additions.

## 2014-04-09 NOTE — Progress Notes (Signed)
Internal Medicine Attending  Date: 04/09/2014  Patient name: [name] Medical record number: [id number] Date of birth: 1925-11-19 Age: 80 y.o. Gender: female  I saw and evaluated the patient. I reviewed the resident's note by Dr. Senaida Ores and I agree with the resident's findings and plans as documented in her progress note.  When I saw [name] on rounds this afternoon she looked much improved compared to the morning prior.  She was sitting in a chair and we had a nice conversation.  She was without any dyspnea.  Cardiac examination is notable for an irregularly irregular heart rate that is slightly tachycardic.  I appreciate Cardiology's input concerning increasing the IV lasix further and adding 1-2 days of metolazone.  Her renal function is actually improved with yesterday's 2 pound diuresis, so I think we have some room to move up on the diuretic doses as recommended.

## 2014-04-09 NOTE — Consult Note (Signed)
CARDIOLOGY CONSULT NOTE   Patient ID: [name] MRN: [id number] DOB/AGE: 02-07-1926 80 y.o.  Admit date: 04/05/2014  Primary Physician   Evelena Peat, DO Primary Cardiologist   Dr. Delton See  Reason for Consultation  CHF  HPI:  [name] is a 80 y.o. female with a history of HTN, DMT2, asthma, Churg-Strauss syndrome, permanent  AFIB on metoprolol and Xarelto, combine systolic and diastolic CHF with (EF 40-45%), and recent submassive PE (08/2013) who presented to [facility] on 04/05/14 with worsening shortness of breath and cough and found to have acute on chronic CHF exacerbation as well as acute asthma exacerbation.   Patient was admitted for IV diuresis and treatment of her asthma. She was started on prednisone and given nebulizers and breathing treatments with much improvement. However, patient still with s/s of volume overload and slow to diurese. [name] states she feels short of breath off and on today. At home, she normally can complete chores and walk around without feeling short of breath. Occasionally during the day or at night she will feel short of breath, but using her inhaler resolves her symptoms. She does not feel like she is at her baseline. She also admits to increased weight. She admit to wheezing off and on and a productive cough with sputum mixed with blood and sputum. She also has chronic 2 pillow orthopnea and PND, but slept better last night. Her sleep was disrupted by frequent urination. She still has significant LE edema, L>R, which is chronically bigger. She does get belly pain from time to time and states that all her meals "go straight through her." She is in persistent afib with RVR and can feeling fluttering in her chest from time to time, but not often. She also gets intermittent chest pain with her last episode being about 30 minutes ago.    Past Medical History  Diagnosis Date  . Hypertension   . Diabetes mellitus   . Asthma   . Osteoarthritis     left  shoulder  . Pseudogout   . Hypokalemia     a. Previously felt due to diuretics, req supplementation.  . Churg-Strauss syndrome     a. Sural nerve biopsy 02/2000.  Darene Lamer disease     a. s/p thyroidectomy.  . Glaucoma   . Hypercholesteremia   . Venous insufficiency   . Osteopenia     DEXA 2004  . Lipoma     left inner thigh  . Cataract     left eye  . VASCULITIS   . Chronic combined systolic and diastolic heart failure     a. 11/979 EF:20-25%  . Pleural effusion, left     a. Thoracentesis 2001 - per notes, no malignant cells, was transudative.  Marland Kitchen NSVT (nonsustained ventricular tachycardia)     a. During CHF adm 2001.  . Multifocal atrial tachycardia     a. Documented as OP 01/2012.  Marland Kitchen Ectopic atrial tachycardia     a. Documented on tele as IP 02/2012.  . Pulmonary HTN     a. Mod by echo 02/2012.  . Valvular heart disease     a. Echo 02/2012: mod MR/TR, mild AI.  Marland Kitchen Pericardial effusion     a. Echo 02/2012: small-mod pericardial effusion.  . Atrial fibrillation, permanent     a. Dx 04/2013, on metoprolol, dig and xarelto  . Shortness of breath      Past Surgical History  Procedure Laterality Date  . Abdominal hysterectomy    .  Salpingoophorectomy    . Thyroidectomy    . Hemiarthroplasty hip Right 10/2010    cemented for hip fracture, femoral neck - by Eulas Post, MD  . Cataract extraction Right   . Hemiarthroplasty hip      No Known Allergies  I have reviewed the patient's current medications . antiseptic oral rinse  7 mL Mouth Rinse BID  . brimonidine  1 drop Left Eye BID  . dorzolamide  1 drop Left Eye BID  . feeding supplement (GLUCERNA SHAKE)  237 mL Oral Q24H  . furosemide  80 mg Intravenous Q12H  . insulin aspart  0-9 Units Subcutaneous TID WC  . insulin glargine  10 Units Subcutaneous QHS  . ipratropium-albuterol  3 mL Nebulization QID  . lisinopril  20 mg Oral Daily  . metolazone  2.5 mg Oral Daily  . metoprolol succinate  50 mg Oral Daily  .  mometasone-formoterol  2 puff Inhalation BID  . multivitamin with minerals  1 tablet Oral Daily  . predniSONE  60 mg Oral Q breakfast  . rivaroxaban  20 mg Oral Q supper  . sodium chloride  3 mL Intravenous Q12H     acetaminophen, acetaminophen, benzonatate, polyethylene glycol  Prior to Admission medications   Medication Sig Start Date End Date Taking? Authorizing Provider  albuterol (PROVENTIL HFA;VENTOLIN HFA) 108 (90 BASE) MCG/ACT inhaler Inhale 1-2 puffs into the lungs every 6 (six) hours as needed for shortness of breath. 06/14/13  Yes Rocco Serene, MD  brimonidine (ALPHAGAN P) 0.1 % SOLN Place 1 drop into the left eye every 12 (twelve) hours. 05/27/12  Yes Lars Mage, MD  dorzolamide (TRUSOPT) 2 % ophthalmic solution Place 1 drop into the left eye 2 (two) times daily.    Yes Historical Provider, MD  Fluticasone-Salmeterol (ADVAIR) 250-50 MCG/DOSE AEPB Inhale 1 puff into the lungs 2 (two) times daily. 06/15/13  Yes Rocco Serene, MD  furosemide (LASIX) 40 MG tablet Take 1 tablet (40 mg total) by mouth 2 (two) times daily. 02/24/14  Yes Alex Elson Clan, DO  lisinopril (PRINIVIL,ZESTRIL) 20 MG tablet Take 1 tablet (20 mg total) by mouth daily. 12/29/13  Yes Courtney Paris, MD  metFORMIN (GLUCOPHAGE) 1000 MG tablet Take 1 tablet (1,000 mg total) by mouth 2 (two) times daily with a meal. 02/24/14  Yes Yolanda Manges, DO  metoprolol succinate (TOPROL-XL) 50 MG 24 hr tablet Take 1 tablet (50 mg total) by mouth daily. Take with or immediately following a meal. 02/24/14  Yes Yolanda Manges, DO  potassium chloride SA (K-DUR,KLOR-CON) 20 MEQ tablet Take 2 tablets (40 mEq total) by mouth daily. 02/24/14  Yes Yolanda Manges, DO  rivaroxaban (XARELTO) 20 MG TABS tablet Take 1 tablet (20 mg total) by mouth every morning. 02/24/14  Yes Yolanda Manges, DO     History   Social History  . Marital Status: Widowed    Spouse Name: N/A    Number of Children: N/A  . Years of Education: N/A   Occupational  History  . Retired    Social History Main Topics  . Smoking status: Never Smoker   . Smokeless tobacco: Never Used  . Alcohol Use: No  . Drug Use: No  . Sexual Activity: Not on file   Other Topics Concern  . Not on file   Social History Narrative   Lives in house, with family just next door.    Never smoker, no alcohol.  Used to work at [employer].    Before that, worked in food prep.                    Family Status  Relation Status Death Age  . Mother Deceased   . Father Deceased    Family History  Problem Relation Age of Onset  . Diabetes Mother   . Diabetes Father   . Heart disease Neg Hx      ROS:  Full 14 point review of systems complete and found to be negative unless listed above.  Physical Exam: Blood pressure 128/64, pulse 99, temperature 97 F (36.1 C), temperature source Axillary, resp. rate 18, height  (1.651 m), weight 156 lb 15.5 oz (71.2 kg), SpO2 96.00%.  General: Well developed, well nourished, elderly female in no acute distress Head: Eyes PERRLA, No xanthomas.   Normocephalic and atraumatic, oropharynx without edema or exudate.  Lungs: Decreased breath sounds at bases.  Heart: irreg irreg, tachy + TR murmur Neck: No carotid bruits. No lymphadenopathy. + JVD to jaw Abdomen: Bowel sounds present, abdomen soft and non-tender without masses or hernias noted. + distended Msk:  No spine or cva tenderness. No weakness, no joint deformities or effusions. Extremities: No clubbing or cyanosis. 2 + edema. L>R Neuro: Alert and oriented X 3. No focal deficits noted. Psych:  Good affect, responds appropriately Skin: No rashes or lesions noted.  Labs:   Lab Results  Component Value Date   WBC 3.2* 04/05/2014   HGB 14.6 04/05/2014   HCT 43.0 04/05/2014   MCV 91.9 04/05/2014   PLT 198 04/05/2014   No results found for this basename: INR,  in the last 72 hours  Recent Labs Lab 04/05/14 1140  04/09/14 0801  NA 133*  < > 140  K  >7.7*  < > 3.9  CL 99  < > 98  CO2 19  < > 32  BUN 13  < > 20  CREATININE 0.63  < > 0.70  CALCIUM 8.8  < > 8.4  PROT 7.4  --   --   BILITOT 1.4*  --   --   ALKPHOS 86  --   --   ALT 8  --   --   AST 50*  --   --   GLUCOSE 111*  < > 134*  ALBUMIN 3.5  --   --   < > = values in this interval not displayed. Magnesium  Date Value Ref Range Status  04/07/2014 1.7  1.5 - 2.5 mg/dL Final   Pro B Natriuretic peptide (BNP)  Date/Time Value Ref Range Status  04/05/2014  8:55 AM 4201.0* 0 - 450 pg/mL Final  02/17/2014  1:50 PM 3166.0* 0 - 450 pg/mL Final   Lab Results  Component Value Date   CHOL 129 04/06/2014   HDL 54 04/06/2014   LDLCALC 65 04/06/2014   TRIG 52 04/06/2014    Lipase  Date/Time Value Ref Range Status  08/18/2013  2:44 PM 25  11 - 59 U/L Final   TSH  Date/Time Value Ref Range Status  11/24/2013  3:10 PM 1.410  0.350 - 4.500 uIU/mL Final     Echo: Study Date: 08/19/2013 LV EF: 40% - 45% Study Conclusions - Left ventricle: Wall thickness was increased in a pattern of moderate LVH. Systolic function was mildly to moderately reduced. The estimated ejection fraction was in the range of 40% to 45%. Wall motion was normal; there were  no regional wall motion abnormalities. Doppler parameters are consistent with arestrictive pattern, indicative of decreased left ventricular diastolic compliance and/or increased left atrial pressure (grade 3 diastolic dysfunction). Doppler parameters are consistent with elevated ventricular end-diastolic filling pressure. - Left atrium: The atrium was moderately dilated. - Right ventricle: The cavity size was mildly dilated. Systolic function was normal. - Right atrium: The atrium was moderately dilated. - Atrial septum: There was a secundum atrial septal defect. Size and Qp/Qs cannot be assessed on current study. A TEE is recommended. - Tricuspid valve: Moderate-severe regurgitation. - Pulmonary arteries: Systolic pressure was  severely increased. PA peak pressure: 60mm Hg (S).    ECG:  Atrial fibrillation with RVR HR 132  Radiology:  Dg Chest 2 View  04/08/2014   CLINICAL DATA:  Two-day history of progressive shortness of breath; history of asthma and atrial fibrillation  EXAM: CHEST  2 VIEW  COMPARISON:  Portable chest x-ray of April 05, 2014  FINDINGS: The lungs are adequately inflated. The interstitial markings are mildly prominent though stable. The cardiopericardial silhouette is quite enlarged. The central pulmonary vascularity is prominent. The left hemidiaphragm is partially obscured. A small amount of pleural fluid is present posteriorly in the costophrenic gutters. The bony thorax exhibits no acute abnormality.  IMPRESSION: CHF with mild interstitial edema. There may be a pericardial effusion.   Electronically Signed   By: David  [place]   On: 04/08/2014 17:11    ASSESSMENT AND PLAN:    Principal Problem:   Asthma with acute exacerbation in adult Active Problems:   DIABETES MELLITUS, TYPE II   HYPERTENSION   Acute on chronic combined systolic and diastolic congestive heart failure   Atrial fibrillation   Valvular heart disease   Current use of long term anticoagulation--xarelto    Assessment & Plan by Problem:  [name] is a 80 y.o. female with a history of HTN, DMT2, asthma, Churg-Strauss syndrome, permanent  AFIB on metoprolol and Xarelto, combine systolic and diastolic CHF with (EF 40-45%), and recent submassive PE (08/2013) who presented to [facility] on 04/05/14 with worsening shortness of breath and cough and found to have acute on chronic CHF exacerbation as well as acute asthma exacerbation.   Acute on chronic combined systolic/diastolic CHF exacerbation: Patient presented with worsening dyspnea, productive cough and hemoptysis 4 days ago. CXR on admit with CHF. Repeat CXR yesterday showed CHF with mild interstitial edema and possible pericardial effusion. Having trouble diuresing patient; fluid  coming off slowly.  - Net I/Os neg 1.33L. Weight down 2 lbs from yesterday. Dry weight ~152 lbs at previous clinic visits. Today 156 lbs.  - Dr. Gala Romney recommended increasing Lasix to 80 mg IV BID and adding Metolazone 2.5 mg QD. She has been improving with diuresis since starting increased Lasix and adding metalazone. Creat normal . GFR 88.  - Continue ACE and BB - Continue ashma meds and prednisone. No more wheezing. Steroids could also be contributing to edema. Taper when felt stable.   Permanent Atrial Fibrillation: Patient is on metoprolol 50 mg daily and Xarelto 20 mg daily at home. HR not well controlled thought to be related to nebs. 150-160s currently. This could also be contributing to CHF. She has a history of afib with bradycardia on January admission in the setting of a submassive PE so titrate rate control medications carefully.  -Continue metoprolol succinate 50 mg qd and Xarelto 20 mg qd -Cardiac monitoring   Chest Pain: Patient complained of chest pain that occurred once 2 days  prior to admission and once one day prior to admission. No objective signs of ischemia. Troponin neg x3 -Resolved   DVT/PE ppx: Xarelto 20 mg daily  Graves disease s/p thyroidectomy: Patient has a history of Graves disease s/p thyroidectomy. Her most recent TSH was normal at 1.410 on 11/24/13. It appears her TSH levels have been WNL since 2008.  -Stable   DM type II:per IM  Hypokalemia:supplemented by IM  HLD:  lipid panel shows cholesterol 129, TG 52, HDL 54, LDL 65.    SignedJaneece Lowery 04/09/2014 12:23 PM  Pager [phone]  Co-Sign MD  Patient seen and examined with Deborha Payment, PA-C. We discussed all aspects of the encounter. I agree with the assessment and plan as stated above.   I suspect main issue here is recurrent HF. I reviewed her ECHO from 1/15 and EF is likely 35-40% with significant diastolic dysfunction. I do not think she has an ASD - I think rather it is a PFO. On  exam she has significant volume overload. Agree with increasing IV lasix and giving her a day or two of metolazone. AF has been fast can increase Toprol as need but I suspect rate will get better as respiratory status improves. Change albuterol to xopenex, if possible. Continue Xarelto.   Daniel Bensimhon,MD 2:05 PM

## 2014-04-10 DIAGNOSIS — I369 Nonrheumatic tricuspid valve disorder, unspecified: Secondary | ICD-10-CM

## 2014-04-10 LAB — BASIC METABOLIC PANEL
Anion gap: 9 (ref 5–15)
BUN: 23 mg/dL (ref 6–23)
CO2: 33 mEq/L — ABNORMAL HIGH (ref 19–32)
Calcium: 8.4 mg/dL (ref 8.4–10.5)
Chloride: 98 mEq/L (ref 96–112)
Creatinine, Ser: 0.74 mg/dL (ref 0.50–1.10)
GFR, EST AFRICAN AMERICAN: 87 mL/min — AB (ref >90)
GFR, EST NON AFRICAN AMERICAN: 75 mL/min — AB (ref >90)
Glucose, Bld: 121 mg/dL — ABNORMAL HIGH (ref 70–99)
Potassium: 3.7 mEq/L (ref 3.7–5.3)
SODIUM: 140 meq/L (ref 137–147)

## 2014-04-10 LAB — GLUCOSE, CAPILLARY
GLUCOSE-CAPILLARY: 161 mg/dL — AB (ref 70–99)
Glucose-Capillary: 105 mg/dL — ABNORMAL HIGH (ref 70–99)
Glucose-Capillary: 157 mg/dL — ABNORMAL HIGH (ref 70–99)
Glucose-Capillary: 76 mg/dL (ref 70–99)

## 2014-04-10 MED ORDER — POTASSIUM CHLORIDE CRYS ER 20 MEQ PO TBCR
40.0000 meq | EXTENDED_RELEASE_TABLET | Freq: Once | ORAL | Status: AC
  Administered 2014-04-10: 40 meq via ORAL
  Filled 2014-04-10: qty 2

## 2014-04-10 MED ORDER — METOPROLOL SUCCINATE ER 100 MG PO TB24
100.0000 mg | ORAL_TABLET | Freq: Every day | ORAL | Status: DC
  Administered 2014-04-10 – 2014-04-13 (×4): 100 mg via ORAL
  Filled 2014-04-10 (×4): qty 1

## 2014-04-10 MED ORDER — IPRATROPIUM-ALBUTEROL 0.5-2.5 (3) MG/3ML IN SOLN: 3.0000 mL | Freq: Four times a day (QID) | RESPIRATORY_TRACT | Status: DC | PRN

## 2014-04-10 NOTE — Progress Notes (Signed)
Subjective:  Patient was seen and examined this morning. Patient reports she still has shortness of breath, off and on, possibly a little better than yesterday. She has a productive cough with phlegm and bloody sputum. She reports a wheeze "now and then." She feels weak and fatigued and her heart felt like it was "going to come out of her chest last night," but it's since slowed down. Ms. Fullard complains of frequent urination. She denies any fever, chills, headache, chest pain or dysuria.  Objective: Vital signs in last 24 hours: Filed Vitals:   04/09/14 1450 04/09/14 2130 04/09/14 2208 04/10/14 0512  BP: 141/88  142/67 138/64  Pulse: 108  102 100  Temp: 98.7 F (37.1 C)  97.8 F (36.6 C) 97.8 F (36.6 C)  TempSrc: Oral  Oral Oral  Resp: Height:      Weight:    70.761 kg (156 lb)  SpO2: 100% 99% 100% 98%   Weight change: -0.439 kg (-15.5 oz)  Intake/Output Summary (Last 24 hours) at 04/10/14 0917 Last data filed at 04/10/14 0914  Gross per 24 hour  Intake   1440 ml  Output   2276 ml  Net   -836 ml    General: Vital signs reviewed. Patient is an elderly female in no acute distress and cooperative with exam.  Cardiovascular: Irregularly irregular, tachycardic, 2/6 blowing holosystolic murmur heard best at the apex. Mildly elevated JVD. Pulmonary/Chest: Clear to auscultation bilaterally, decreased breath sounds, but no rhonchi, wheezing or crackles. No accessory muscle use. Abdominal: Soft, non-tender, non-distended, BS +, no suprapubic tenderness. Musculoskeletal: No joint deformities, erythema, or stiffness, ROM full and nontender.  Extremities: 2+ pitting edema in her left lower extremity extending up to mid-shin, 1+ pitting edema in her right lower extremity extending to her ankle, no calf tenderness, pulses symmetric and intact bilaterally.  Neurological: AA&O x3, no focal motor deficit, sensory intact to light touch bilaterally.  Skin: Warm, dry and intact. No  rashes or erythema. Psychiatric: Normal mood and affect. speech and behavior is normal. Cognition and memory are normal.   Lab Results: Basic Metabolic Panel:  Recent Labs Lab 04/07/14 1003 04/07/14 1015  04/09/14 0801 04/10/14 0340  NA 136*  --   < > 140 140  K 3.5*  --   < > 3.9 3.7  CL 96  --   < > 98 98  CO2 27  --   < > 32 33*  GLUCOSE 272*  --   < > 134* 121*  BUN 17  --   < > 20 23  CREATININE 0.70  --   < > 0.70 0.74  CALCIUM 9.1  --   < > 8.4 8.4  MG  --  1.7  --   --   --   PHOS 2.9  --   --   --   --   < > = values in this interval not displayed. Liver Function Tests:  Recent Labs Lab 04/05/14 1140  AST 50*  ALT 8  ALKPHOS 86  BILITOT 1.4*  PROT 7.4  ALBUMIN 3.5   CBC:  Recent Labs Lab 04/05/14 0839 04/05/14 0904  WBC 3.2*  --   NEUTROABS 1.6*  --   HGB 12.5 14.6  HCT 38.6 43.0  MCV 91.9  --   PLT 198  --    Cardiac Enzymes:  Recent Labs Lab 04/05/14 1359 04/05/14 1600 04/05/14 2300  TROPONINI <0.30 <0.30 <0.30  BNP:  Recent Labs Lab 04/05/14 0855  PROBNP 4201.0*   CBG:  Recent Labs Lab 04/09/14 0602 04/09/14 1115 04/09/14 1609 04/09/14 2211 04/09/14 2317 04/10/14 0515  GLUCAP 131* 235* 427* 170* 167* 105*   Hemoglobin A1C:  Recent Labs Lab 04/05/14 1140  HGBA1C 7.9*   Studies/Results: Dg Chest 2 View  04/08/2014   CLINICAL DATA:  Two-day history of progressive shortness of breath; history of asthma and atrial fibrillation  EXAM: CHEST  2 VIEW  COMPARISON:  Portable chest x-ray of April 05, 2014  FINDINGS: The lungs are adequately inflated. The interstitial markings are mildly prominent though stable. The cardiopericardial silhouette is quite enlarged. The central pulmonary vascularity is prominent. The left hemidiaphragm is partially obscured. A small amount of pleural fluid is present posteriorly in the costophrenic gutters. The bony thorax exhibits no acute abnormality.  IMPRESSION: CHF with mild interstitial edema.  There may be a pericardial effusion.   Electronically Signed   By: David  [place]   On: 04/08/2014 17:11   Medications: I have reviewed the patient's current medications.  Prescriptions prior to admission  Medication Sig Dispense Refill  . albuterol (PROVENTIL HFA;VENTOLIN HFA) 108 (90 BASE) MCG/ACT inhaler Inhale 1-2 puffs into the lungs every 6 (six) hours as needed for shortness of breath.  18 g  11  . brimonidine (ALPHAGAN P) 0.1 % SOLN Place 1 drop into the left eye every 12 (twelve) hours.  15 mL  1  . dorzolamide (TRUSOPT) 2 % ophthalmic solution Place 1 drop into the left eye 2 (two) times daily.       . Fluticasone-Salmeterol (ADVAIR) 250-50 MCG/DOSE AEPB Inhale 1 puff into the lungs 2 (two) times daily.  60 each  11  . furosemide (LASIX) 40 MG tablet Take 1 tablet (40 mg total) by mouth 2 (two) times daily.  60 tablet  2  . lisinopril (PRINIVIL,ZESTRIL) 20 MG tablet Take 1 tablet (20 mg total) by mouth daily.  30 tablet  3  . metFORMIN (GLUCOPHAGE) 1000 MG tablet Take 1 tablet (1,000 mg total) by mouth 2 (two) times daily with a meal.  60 tablet  3  . metoprolol succinate (TOPROL-XL) 50 MG 24 hr tablet Take 1 tablet (50 mg total) by mouth daily. Take with or immediately following a meal.  30 tablet  3  . potassium chloride SA (K-DUR,KLOR-CON) 20 MEQ tablet Take 2 tablets (40 mEq total) by mouth daily.  60 tablet  2  . rivaroxaban (XARELTO) 20 MG TABS tablet Take 1 tablet (20 mg total) by mouth every morning.  30 tablet  3    Scheduled Meds: . antiseptic oral rinse  7 mL Mouth Rinse BID  . brimonidine  1 drop Left Eye BID  . dorzolamide  1 drop Left Eye BID  . feeding supplement (GLUCERNA SHAKE)  237 mL Oral Q24H  . furosemide  80 mg Intravenous Q12H  . insulin aspart  0-9 Units Subcutaneous TID WC  . insulin glargine  15 Units Subcutaneous QHS  . ipratropium-albuterol  3 mL Nebulization QID  . lisinopril  20 mg Oral Daily  . metolazone  2.5 mg Oral Daily  . metoprolol succinate   50 mg Oral Daily  . mometasone-formoterol  2 puff Inhalation BID  . multivitamin with minerals  1 tablet Oral Daily  . potassium chloride  40 mEq Oral Once  . rivaroxaban  20 mg Oral Q supper  . sodium chloride  3 mL Intravenous Q12H   Continuous Infusions:  PRN Meds:.acetaminophen, acetaminophen, benzonatate, polyethylene glycol  Assessment/Plan: Mild Asthma Exacerbation with concomitant combined CHF exacerbation: Asthma exacerbation has resolved, but patient continues to be dyspneic and volume overloaded. Net I/Os yesterday were -626, improved with increased Lasix and metolazone. We had consulted Cardiology yesterday for Heart Failure management and Dr. Gala Romney recommended increasing Lasix to 80 mg IV BID and adding Metolazone 2.5 mg QD. BUN/Cr are still within normal limits, 23/0.74.  -Continue lisinopril 20 mg daily  -Continue metoprolol XR 50 mg daily  -Duoneb (ipratropium-albuterol) Q4H prn wheezing, shortness of breath  -Dulera (mometasone-formoterol) 2 puffs BID  -Cardiac monitoring  -Daily weights  -Cardiac Diet  -I/Os  -Oxygen via [place] 2L  -Lasix 80 mg IV BID --> switch to po lasix tomorrow  -Metolazone 2.5 mg QD for 1 more day -Continue aggressive diuresis and monitor BUN/Cr -Heart Failure Team following -Echocardiogram pending -BMET in am  Permanent Atrial Fibrillation: Patient is on metoprolol 50 mg daily and Xarelto 20 mg daily at home. HR normally 80-120s, episodes up to 150s-160s.  -Continue metoprolol 50 mg daily -We could consider increasing metoprolol, but we should do this very carefully. Cardiology stated patient had episodes of bradycardia during admission in January. We will wait for echo results. -Xarelto 20 mg daily with dinner -Cardiac monitoring  DM type II: Patient is on metformin 1 gram BID at home. Most recent HgbA1C was 6.7 on 11/24/13. Glucose has ranged 100-270 over the past 2 days, likely contributed to by steroid treatment. Blood glucose is highest  around 4-5 pm. Patient had a cbg of 427 yesterday. We gave 12 units of novolog and the resulting cbgs were in the 100s-170s.  -Lantus 15 Units QHS -SSI-sensitive  -CBG checks 4 times daily  -Cardiac diet   Hypokalemia: Patient's potassium is 3.7 today. Patient has chronically low-normal potassium, baseline ~3.6, most likely contributed to by Lasix at home. Magnesium and phosphorus are normal. I gave an additional Kdur 40 mEq in the setting of low normal potassium and continued aggressive IV lasix today. -We will consider starting her on potassium supplementation for home -KDur 40 mEq once -BMET in am  Graves disease s/p thyroidectomy: Patient has a history of Graves disease s/p thyroidectomy. Her most recent TSH was normal at 1.410 on 11/24/13. It appears her TSH levels have been WNL since 2008.  -Stable   HLD: Most recent lipid panel on 03/16/13 showed cholesterol 132, TG 74, HDL 54, and LDL 63. Patient is not currently on a statin at home. Repeat lipid panel shows cholesterol 129, TG 52, HDL 54, LDL 65.  -Stable  DVT/PE ppx: Xarelto 20 mg daily  Dispo: Disposition is deferred at this time, awaiting improvement of current medical problems.  Anticipated discharge in approximately 1-2 day(s).   The patient does have a current PCP Yolanda Manges, DO) and does need an [facility] hospital follow-up appointment after discharge.  The patient does not have transportation limitations that hinder transportation to clinic appointments.  .Services Needed at time of discharge: Y = Yes, Blank = No PT:   OT:   RN:   Equipment:   Other:     LOS: 5 days   Jill Alexanders, DO PGY-1 Internal Medicine Resident Pager # [phone] 04/10/2014 9:17 AM

## 2014-04-10 NOTE — Progress Notes (Signed)
  I have seen and examined the patient, and reviewed the daily progress note by Alford Highland, MS III and discussed the care of the patient with them. Please see my progress note from 04/10/2014 for further details regarding assessment and plan.    Signed:  Jill Alexanders, DO PGY-1 Internal Medicine Resident Pager # [phone] 04/10/2014 10:18 AM

## 2014-04-10 NOTE — Progress Notes (Signed)
  PROGRESS NOTE MEDICINE TEACHING ATTENDING   Day 5 of stay Patient name: [name]   Medical record number: [id number] Date of birth: [birth year]   I met with [name], and evaluated her. She appeared to be comfortable and had no new complaints. She reports not feeling "a whole lot better" however does not appear to be in any acute distress. Her lungs are clear to auscultation, and she has tachycardia on exam.   Assessment and Plan                                                                      [name]'s asthma exacerbation is much improved. For her CHF exacerbation, I appreciate cardiology input, and agree with addition of metolazone. She has a net negative of about 2 litres, and her weight is down by 2 lbs. Her kidney function remains within normal limits despite being on high dose lasix and metolazone. Beta blocker increased to 100 mg by cardiology. Will continue to monitor.   I have discussed the care of this patient with my IM team residents. Please see the resident note for details.  [place], [place] 04/10/2014, 10:46 AM.

## 2014-04-10 NOTE — Progress Notes (Signed)
1000 Pt with episode of intermittent Afib 149 -160 . Pt denied palpitations or chest pain Vs stable Fr Bradwaj made aware . Will discuss with Cardiology . Dr Virgina Jock came in seen the pt . Left with order. Carried out

## 2014-04-10 NOTE — Progress Notes (Signed)
Patient Name: [name] Date of Encounter: 04/10/2014     Principal Problem:   Asthma with acute exacerbation in adult Active Problems:   DIABETES MELLITUS, TYPE II   HYPERTENSION   Acute on chronic combined systolic and diastolic congestive heart failure   Atrial fibrillation   Valvular heart disease   Current use of long term anticoagulation--xarelto    SUBJECTIVE  Denies any CP or SOB. SOB is getting better.  CURRENT MEDS . antiseptic oral rinse  7 mL Mouth Rinse BID  . brimonidine  1 drop Left Eye BID  . dorzolamide  1 drop Left Eye BID  . feeding supplement (GLUCERNA SHAKE)  237 mL Oral Q24H  . furosemide  80 mg Intravenous Q12H  . insulin aspart  0-9 Units Subcutaneous TID WC  . insulin glargine  15 Units Subcutaneous QHS  . ipratropium-albuterol  3 mL Nebulization QID  . lisinopril  20 mg Oral Daily  . metolazone  2.5 mg Oral Daily  . metoprolol succinate  50 mg Oral Daily  . mometasone-formoterol  2 puff Inhalation BID  . multivitamin with minerals  1 tablet Oral Daily  . rivaroxaban  20 mg Oral Q supper  . sodium chloride  3 mL Intravenous Q12H    OBJECTIVE  Filed Vitals:   04/09/14 1450 04/09/14 2130 04/09/14 2208 04/10/14 0512  BP: 141/88  142/67 138/64  Pulse: 108  102 100  Temp: 98.7 F (37.1 C)  97.8 F (36.6 C) 97.8 F (36.6 C)  TempSrc: Oral  Oral Oral  Resp: Height:      Weight:    156 lb (70.761 kg)  SpO2: 100% 99% 100% 98%    Intake/Output Summary (Last 24 hours) at 04/10/14 0751 Last data filed at 04/10/14 0735  Gross per 24 hour  Intake   1200 ml  Output   2176 ml  Net   -976 ml   Filed Weights   04/08/14 0638 04/09/14 0429 04/10/14 0512  Weight: 158 lb 4.6 oz (71.8 kg) 156 lb 15.5 oz (71.2 kg) 156 lb (70.761 kg)    PHYSICAL EXAM  General: Pleasant, NAD. Neuro: Alert and oriented X 3. Moves all extremities spontaneously. Psych: Normal affect. HEENT:  Normal  Neck: Supple without bruits. Mildly elevated  JVD Lungs:  Resp regular and unlabored, decreased breath sound, however no significant rales on exam Heart: irregular no s3, s4, or murmurs. Abdomen: Soft, non-tender, non-distended, BS + x 4.  Extremities: No clubbing, cyanosis or edema. DP/PT/Radials 2+ and equal bilaterally.  Accessory Clinical Findings  CBC No results found for this basename: WBC, NEUTROABS, HGB, HCT, MCV, PLT,  in the last 72 hours Basic Metabolic Panel  Recent Labs  04/07/14 1003 04/07/14 1015  04/09/14 0801 04/10/14 0340  NA 136*  --   < > 140 140  K 3.5*  --   < > 3.9 3.7  CL 96  --   < > 98 98  CO2 27  --   < > 32 33*  GLUCOSE 272*  --   < > 134* 121*  BUN 17  --   < > 20 23  CREATININE 0.70  --   < > 0.70 0.74  CALCIUM 9.1  --   < > 8.4 8.4  MG  --  1.7  --   --   --   PHOS 2.9  --   --   --   --   < > =  values in this interval not displayed.  TELE A-fib with HR 80-low 90s, occasionally in low 100s    ECG  Atrial fibrillation with RVR  Echocardiogram  08/19/2013   LV EF: 40% - 45%  ------------------------------------------------------------ History: PMH: Evaluate for right heart strain. Atrial fibrillation. Congestive heart failure. Hypertrophic cardiomyopathy. Risk factors: Grave's disease. Glaucoma. Osteopenia. Atrial tachycardia. GERD. Asthma. Churg-Strauss. Hypokalemia. PNA. Hypertension. Diabetes mellitus. Dyslipidemia.  ------------------------------------------------------------ Study Conclusions  - Left ventricle: Wall thickness was increased in a pattern of moderate LVH. Systolic function was mildly to moderately reduced. The estimated ejection fraction was in the range of 40% to 45%. Wall motion was normal; there were no regional wall motion abnormalities. Doppler parameters are consistent with arestrictive pattern, indicative of decreased left ventricular diastolic compliance and/or increased left atrial pressure (grade 3 diastolic dysfunction). Doppler parameters are  consistent with elevated ventricular end-diastolic filling pressure. - Left atrium: The atrium was moderately dilated. - Right ventricle: The cavity size was mildly dilated. Systolic function was normal. - Right atrium: The atrium was moderately dilated. - Atrial septum: There was a secundum atrial septal defect. Size and Qp/Qs cannot be assessed on current study. A TEE is recommended. - Tricuspid valve: Moderate-severe regurgitation. - Pulmonary arteries: Systolic pressure was severely increased. PA peak pressure: 60mm Hg (S).      Radiology/Studies  Dg Chest 2 View  04/08/2014   CLINICAL DATA:  Two-day history of progressive shortness of breath; history of asthma and atrial fibrillation  EXAM: CHEST  2 VIEW  COMPARISON:  Portable chest x-ray of April 05, 2014  FINDINGS: The lungs are adequately inflated. The interstitial markings are mildly prominent though stable. The cardiopericardial silhouette is quite enlarged. The central pulmonary vascularity is prominent. The left hemidiaphragm is partially obscured. A small amount of pleural fluid is present posteriorly in the costophrenic gutters. The bony thorax exhibits no acute abnormality.  IMPRESSION: CHF with mild interstitial edema. There may be a pericardial effusion.   Electronically Signed   By: David  [place]   On: 04/08/2014 17:11   Dg Chest Port 1 View  04/05/2014   CLINICAL DATA:  Shortness of breath.  EXAM: PORTABLE CHEST - 1 VIEW  COMPARISON:  02/17/2014.  12/15/2013.  FINDINGS: Cardiomegaly with mild increase in pulmonary vascularity. Minimal interstitial prominence noted. Blunting of left costophrenic consistent with small effusion again noted. These findings suggest mild congestive heart failure. Bibasilar atelectasis. No pneumothorax. No acute bony abnormality.  IMPRESSION: 1. Severe cardiomegaly again noted. 2. Mild CHF.   Electronically Signed   By: Maisie Fus  Register   On: 04/05/2014 09:10    ASSESSMENT AND PLAN  1. Acute  on chronic combined systolic and diastolic HF  - dry weight 152 lbs at previous clinic visits  - continue diuresis with IV lasix and metolazone. Cr stable, weight still 156. Will transition to PO lasix tomorrow and discontinue metolazone  2. Permanent atrial fibrillation: on Xarelto 3. Asthma exacerbation 4. H/o DVT/PE  - on Xarelto  daily 5. Graves disease s/p thyroidectomy   Signed, Amedeo Plenty Pager: [phone]  History and all data above reviewed.  Patient examined.  I agree with the findings as above.  The patient exam reveals JYN:[id number]  ,  Lungs: Clear  ,  Abd: Positive bowel sounds, no rebound no guarding, Ext No edema  .  All available labs, radiology testing, previous records reviewed. Agree with documented assessment and plan. CHF:  [name] says that [name] is breathing much better than on admission.  I do note that her atrial fib rate is up and [name] has spikes to 160.  I will increase the beta blocker.     Fayrene Fearing Baley Lorimer  10:15 AM  04/10/2014

## 2014-04-10 NOTE — Progress Notes (Signed)
  Echocardiogram 2D Echocardiogram has been performed.  Arvil Chaco 04/10/2014, 12:13 PM

## 2014-04-10 NOTE — Progress Notes (Signed)
PT Cancellation Note  Patient Details Name: [name] MRN: [id number] DOB: 06-07-1926   Cancelled Treatment:    Reason Eval/Treat Not Completed: Medical issues which prohibited therapy--increased HR. Spoke with RN who recommended PT be held today. Will check back on tomorrow.    Rebeca Alert, MPT Pager: [phone]

## 2014-04-10 NOTE — Progress Notes (Signed)
Subjective: [name] is an 80 YO woman with a PMH of asthma, CHF, A fib, DM2, and HLD on HD3 who initially presented with SOB, wheezing, and two brief episodes of chest pain.  Overnight, the patient reports that she slept well and that her wheezing has greatly improved since admission. She reports an occasional cough, but it has also greatly improved since Saturday. She states that she had a couple episodes overnight where she felt her heart was beating "pretty fast." She denies any chest pain, fever, nausea, or vomiting. She is tolerating a normal diet. She refused Foley catheter placement and is urinating spontaneously with increased output. Her bowel movements have become more regular since administration of Miralax.   Objective: Vital signs in last 24 hours: Filed Vitals:   04/09/14 1450 04/09/14 2130 04/09/14 2208 04/10/14 0512  BP: 141/88  142/67 138/64  Pulse: 108  102 100  Temp: 98.7 F (37.1 C)  97.8 F (36.6 C) 97.8 F (36.6 C)  TempSrc: Oral  Oral Oral  Resp: Height:      Weight:    70.761 kg (156 lb)  SpO2: 100% 99% 100% 98%   Weight change: -0.439 kg (-15.5 oz)  Intake/Output Summary (Last 24 hours) at 04/10/14 0843 Last data filed at 04/10/14 0805  Gross per 24 hour  Intake   1200 ml  Output   2276 ml  Net  -1076 ml   BP 138/64  Pulse 100  Temp(Src) 97.8 F (36.6 C) (Oral)  Resp 18  Ht  (1.651 m)  Wt 70.761 kg (156 lb)  BMI 25.96 kg/m2  SpO2 98% General appearance: alert, cooperative and no distress Neck: 6-8 cm, no carotid bruit present Head: Normocephalic, without obvious abnormality, atraumatic Lungs: clear to auscultation bilaterally and no wheezing or crackles appreciated Heart: irregularly irregular rhythm and tachycardic. 2/6 systolic murmur heard best over the apex Abdomen: soft, non-tender; bowel sounds normal; no masses,  no organomegaly Extremities: edema trace on R leg, 1+ in L leg to mid calf  Lab Results: Basic Metabolic  Panel:  Recent Labs Lab 04/07/14 1003 04/07/14 1015  04/09/14 0801 04/10/14 0340  NA 136*  --   < > 140 140  K 3.5*  --   < > 3.9 3.7  CL 96  --   < > 98 98  CO2 27  --   < > 32 33*  GLUCOSE 272*  --   < > 134* 121*  BUN 17  --   < > 20 23  CREATININE 0.70  --   < > 0.70 0.74  CALCIUM 9.1  --   < > 8.4 8.4  MG  --  1.7  --   --   --   PHOS 2.9  --   --   --   --   < > = values in this interval not displayed.   BNP:  Recent Labs Lab 04/05/14 0855  PROBNP 4201.0*   CBG:  Recent Labs Lab 04/09/14 0602 04/09/14 1115 04/09/14 1609 04/09/14 2211 04/09/14 2317 04/10/14 0515  GLUCAP 131* 235* 427* 170* 167* 105*   Studies/Results: Dg Chest 2 View  04/08/2014   CLINICAL DATA:  Two-day history of progressive shortness of breath; history of asthma and atrial fibrillation  EXAM: CHEST  2 VIEW  COMPARISON:  Portable chest x-ray of April 05, 2014  FINDINGS: The lungs are adequately inflated. The interstitial markings are mildly prominent though stable. The cardiopericardial silhouette  is quite enlarged. The central pulmonary vascularity is prominent. The left hemidiaphragm is partially obscured. A small amount of pleural fluid is present posteriorly in the costophrenic gutters. The bony thorax exhibits no acute abnormality.  IMPRESSION: CHF with mild interstitial edema. There may be a pericardial effusion.   Electronically Signed   By: David  [place]   On: 04/08/2014 17:11   Medications: I have reviewed the patient's current medications. Scheduled Meds: . antiseptic oral rinse  7 mL Mouth Rinse BID  . brimonidine  1 drop Left Eye BID  . dorzolamide  1 drop Left Eye BID  . feeding supplement (GLUCERNA SHAKE)  237 mL Oral Q24H  . furosemide  80 mg Intravenous Q12H  . insulin aspart  0-9 Units Subcutaneous TID WC  . insulin glargine  15 Units Subcutaneous QHS  . ipratropium-albuterol  3 mL Nebulization QID  . lisinopril  20 mg Oral Daily  . metolazone  2.5 mg Oral Daily  .  metoprolol succinate  50 mg Oral Daily  . mometasone-formoterol  2 puff Inhalation BID  . multivitamin with minerals  1 tablet Oral Daily  . rivaroxaban  20 mg Oral Q supper  . sodium chloride  3 mL Intravenous Q12H   Continuous Infusions:  PRN Meds:.acetaminophen, acetaminophen, benzonatate, polyethylene glycol  Assessment/Plan: Principal Problem:   Asthma with acute exacerbation in adult Active Problems:   DIABETES MELLITUS, TYPE II   HYPERTENSION   Acute on chronic combined systolic and diastolic congestive heart failure   Atrial fibrillation   Valvular heart disease   Current use of long term anticoagulation--xarelto  Mild Asthma Exacerbation with concomitant combined CHF exacerbation: Patient presented with worsening dyspnea, productive cough and hemoptysis 5 days ago. Repeat CXR showed CHF with mild interstitial edema and possible pericardial effusion. Oxygen saturation overnight has ranged from 94-100% on 2L [place] via [place]. Net I/Os from 8/23 was -626. BUN/Cr today is 23/0.74. Dry weight appears to be 152 lbs from previous clinic visits. Most recent hospital weight is 156 lbs. Today, patient was slightly dyspneic, but not in distress. No crackles or wheezes auscultated. We consulted Cardiology for Heart Failure management and Dr. Gala Romney recommended increasing Lasix to 80 mg IV BID and adding Metolazone 2.5 mg QD. Plan to d/c Metolazone today if diuresis improves. Persistent tachycardia throughout the night, ranging from 99-108. Consider increasing dose of metropolol to better manage HR after the results from the echo return. -Continue lisinopril 20 mg daily  -Continue metoprolol XR 50 mg daily  -Duoneb (ipratropium-albuterol) Q4H prn wheezing, shortness of breath  -Dulera (mometasone-formoterol) 2 puffs BID  -Cardiac monitoring  -Daily weights  -Cardiac Diet  -I/Os  -Oxygen via [place] 2L  -Lasix 80 mg IV BID  -Metolazone 2.5 mg QD for 1 day  -Continue aggressive diuresis and  monitor BUN/Cr  -Heart Failure Team following  -Echocardiogram scheduled for today, 8/24 to evaluate potential pericardial effusion  Permanent Atrial Fibrillation: Patient is on metoprolol 50 mg daily and Xarelto 20 mg daily at home. HR normally 80-120s, episodes up to 150s-160s.  -Continue metoprolol 50 mg daily  -We could consider increasing metoprolol, but we should do this very carefully. Cardiology stated patient had episodes of bradycardia during admission in January. Waiting for results of echo to determine plan of action. -Xarelto 20 mg daily with dinner  -Cardiac monitoring   DM type II: Patient is on metformin 1 gram BID at home. Most recent HgbA1C was 6.7 on 11/24/13. Glucose has ranged 100-2 over the past  2 days, likely contributed to by steroid treatment. Blood glucose is highest around 4-5 pm -Lantus 15 Units QHS  -SSI-sensitive  -CBG checks 4 times daily  -Cardiac diet   Hypokalemia: Patient's potassium was 3.7 today. Patient has chronically low-normal potassium, baseline ~3.6, most likely contributed to by Lasix at home. Magnesium and phosphorus are normal.  -Resolved  -We will consider starting her on potassium supplementation for home   Chest Pain: Patient complained of chest pain that occurred once 2 days prior to admission and once one day prior to admission. EKG shows T wave inversion in leads aVL, V1, V5, and V6 similar to prior and non-specific questionable ST elevation in lead V3. Chest pain was most likely due to recent onset of dyspnea and tachypnea. Troponins have been negative x 3. Repeat EKG was similar to prior. Patient has not had recurrent chest pain.  -Cardiac monitoring  -Resolved   Graves disease s/p thyroidectomy: Patient has a history of Graves disease s/p thyroidectomy. Her most recent TSH was normal at 1.410 on 11/24/13. It appears her TSH levels have been WNL since 2008.  -Stable   HLD: Most recent lipid panel on 03/16/13 showed cholesterol 132, TG 74,  HDL 54, and LDL 63. Patient is not currently on a statin at home. Repeat lipid panel shows cholesterol 129, TG 52, HDL 54, LDL 65.  -Stable   DVT/PE ppx: Xarelto 20 mg daily   Dispo: Disposition is deferred at this time, awaiting improvement of current medical problems. Anticipated discharge in approximately 1-2 day(s).   The patient does have a current PCP Yolanda Manges, DO) and does need an [facility] hospital follow-up appointment after discharge.   The patient does not have transportation limitations that hinder transportation to clinic appointments.   This is a Psychologist, occupational Note.  The care of the patient was discussed with Dr. Senaida Ores and the assessment and plan formulated with their assistance.  Please see their attached note for official documentation of the daily encounter.   LOS: 5 days   Alford Highland, Med Student 04/10/2014, 8:43 AM

## 2014-04-11 LAB — BASIC METABOLIC PANEL
Anion gap: 10 (ref 5–15)
Anion gap: 10 (ref 5–15)
BUN: 23 mg/dL (ref 6–23)
BUN: 24 mg/dL — AB (ref 6–23)
CALCIUM: 8.9 mg/dL (ref 8.4–10.5)
CO2: 38 meq/L — AB (ref 19–32)
CO2: 38 mEq/L — ABNORMAL HIGH (ref 19–32)
CREATININE: 0.71 mg/dL (ref 0.50–1.10)
Calcium: 9 mg/dL (ref 8.4–10.5)
Chloride: 92 mEq/L — ABNORMAL LOW (ref 96–112)
Chloride: 91 mEq/L — ABNORMAL LOW (ref 96–112)
Creatinine, Ser: 0.78 mg/dL (ref 0.50–1.10)
GFR calc Af Amer: 85 mL/min — ABNORMAL LOW (ref >90)
GFR calc non Af Amer: 76 mL/min — ABNORMAL LOW (ref >90)
GFR, EST AFRICAN AMERICAN: 88 mL/min — AB (ref >90)
GFR, EST NON AFRICAN AMERICAN: 74 mL/min — AB (ref >90)
GLUCOSE: 108 mg/dL — AB (ref 70–99)
Glucose, Bld: 120 mg/dL — ABNORMAL HIGH (ref 70–99)
Potassium: 3.3 mEq/L — ABNORMAL LOW (ref 3.7–5.3)
Potassium: 3.5 mEq/L — ABNORMAL LOW (ref 3.7–5.3)
Sodium: 140 mEq/L (ref 137–147)
Sodium: 139 mEq/L (ref 137–147)

## 2014-04-11 LAB — CBC
HEMATOCRIT: 42 % (ref 36.0–46.0)
HEMOGLOBIN: 13.7 g/dL (ref 12.0–15.0)
MCH: 30.5 pg (ref 26.0–34.0)
MCHC: 32.6 g/dL (ref 30.0–36.0)
MCV: 93.5 fL (ref 78.0–100.0)
Platelets: 320 10*3/uL (ref 150–400)
RBC: 4.49 MIL/uL (ref 3.87–5.11)
RDW: 14.7 % (ref 11.5–15.5)
WBC: 4.1 10*3/uL (ref 4.0–10.5)

## 2014-04-11 LAB — GLUCOSE, CAPILLARY
Glucose-Capillary: 104 mg/dL — ABNORMAL HIGH (ref 70–99)
Glucose-Capillary: 166 mg/dL — ABNORMAL HIGH (ref 70–99)
Glucose-Capillary: 96 mg/dL (ref 70–99)
Glucose-Capillary: 213 mg/dL — ABNORMAL HIGH (ref 70–99)

## 2014-04-11 MED ORDER — SPIRONOLACTONE 12.5 MG HALF TABLET
12.5000 mg | ORAL_TABLET | Freq: Every day | ORAL | Status: DC
  Administered 2014-04-11 – 2014-04-12 (×2): 12.5 mg via ORAL
  Filled 2014-04-11 (×2): qty 1

## 2014-04-11 MED ORDER — FUROSEMIDE 40 MG PO TABS
40.0000 mg | ORAL_TABLET | Freq: Two times a day (BID) | ORAL | Status: DC
  Filled 2014-04-11 (×3): qty 1

## 2014-04-11 MED ORDER — FUROSEMIDE 80 MG PO TABS
80.0000 mg | ORAL_TABLET | Freq: Two times a day (BID) | ORAL | Status: DC
  Administered 2014-04-11: 80 mg via ORAL
  Filled 2014-04-11 (×4): qty 1

## 2014-04-11 MED ORDER — SPIRONOLACTONE 25 MG PO TABS: 25.0000 mg | ORAL_TABLET | Freq: Every day | ORAL | Status: DC

## 2014-04-11 MED ORDER — POTASSIUM CHLORIDE CRYS ER 20 MEQ PO TBCR
40.0000 meq | EXTENDED_RELEASE_TABLET | Freq: Once | ORAL | Status: AC
  Administered 2014-04-11: 40 meq via ORAL
  Filled 2014-04-11: qty 2

## 2014-04-11 NOTE — Progress Notes (Signed)
UR completed Malayzia Laforte K. Ranell Finelli, RN, BSN, MSHL, CCM  04/11/2014 4:52 PM

## 2014-04-11 NOTE — Progress Notes (Signed)
  I have seen and examined the patient, and reviewed the daily progress note by Alford Highland, MS III and discussed the care of the patient with them. Please see my progress note from 04/11/2014 for further details regarding assessment and plan.    Signed:  Jill Alexanders, DO PGY-1 Internal Medicine Resident Pager # [phone] 04/11/2014 11:53 AM

## 2014-04-11 NOTE — Progress Notes (Signed)
Subjective: [name] is an 80 YO woman on HD6 with a PMH of asthma, CHF, A fib, DM2, and HLD on HD3 who initially presented with SOB, wheezing, and two brief episodes of chest pain. Overnight, the patient reports that she slept well and that she is no longer wheezing. She says that she feels like her heart rate has come down since last night and that she is feeling stronger. She denies any chest pain, fever, nausea, or vomiting. She is tolerating a normal diet. She reports frequent urination, which is expected with her large doses of diuretics. She reports that her legs are less swollen as well. Her bowel movements have become more regular since administration of Miralax.  Objective: Vital signs in last 24 hours: Filed Vitals:   04/10/14 1616 04/10/14 2035 04/11/14 0451 04/11/14 0738  BP: 130/60 132/64 128/68   Pulse: 136 78 66   Temp:  98.7 F (37.1 C) 98.4 F (36.9 C)   TempSrc:  Oral Oral   Resp:  18 20   Height:      Weight:   70.34 kg (155 lb 1.2 oz)   SpO2:  95% 96% 94%   Weight change: -0.421 kg (-14.9 oz)  Intake/Output Summary (Last 24 hours) at 04/11/14 0848 Last data filed at 04/11/14 0847  Gross per 24 hour  Intake    720 ml  Output   2375 ml  Net  -1655 ml   BP 128/68  Pulse 66  Temp(Src) 98.4 F (36.9 C) (Oral)  Resp 20  Ht 5\' 5"  (1.651 m)  Wt 70.34 kg (155 lb 1.2 oz)  BMI 25.81 kg/m2  SpO2 94% General appearance: alert, cooperative and no distress Neck: JVD - 6 cm above sternal notch, no adenopathy, no carotid bruit, supple, symmetrical, trachea midline and thyroid not enlarged, symmetric, no tenderness/mass/nodules Lungs: clear to auscultation bilaterally Heart: irregularly irregular rhythm and tachycardia Abdomen: soft, non-tender; bowel sounds normal; no masses,  no organomegaly Extremities: edema trace in R leg, 1+ in L leg, but less than yesterday  Lab Results: Basic Metabolic Panel:  Recent Labs Lab 04/07/14 1003 04/07/14 1015  04/10/14 0340  04/11/14 0459  NA 136*  --   < > 140 140  K 3.5*  --   < > 3.7 3.3*  CL 96  --   < > 98 92*  CO2 27  --   < > 33* 38*  GLUCOSE 272*  --   < > 121* 108*  BUN 17  --   < > 23 23  CREATININE 0.70  --   < > 0.74 0.78  CALCIUM 9.1  --   < > 8.4 8.9  MG  --  1.7  --   --   --   PHOS 2.9  --   --   --   --   < > = values in this interval not displayed.   Recent Labs Lab 04/09/14 2317 04/10/14 0515 04/10/14 1208 04/10/14 1703 04/10/14 2157 04/11/14 0559  GLUCAP 167* 105* 157* 76 161* 104*   Studies/Results: 2D Echo with contrast - Study Conclusions  - Left ventricle: The cavity size was normal. Systolic function was moderately reduced. The estimated ejection fraction was in the range of 35% to 40%. There is akinesis of the entireanteroseptal myocardium. There is akinesis of the basal-midinferoseptal myocardium. There is akinesis of the entireinferior myocardium. - Aortic valve: There was mild regurgitation. - Aorta: Aortic root dimension: 36 mm (ED). - Ascending aorta: The  ascending aorta was mildly dilated. - Mitral valve: There was mild regurgitation. - Left atrium: The atrium was severely dilated. - Right atrium: The atrium was severely dilated. - Tricuspid valve: There was severe regurgitation. - Pulmonic valve: There was trivial regurgitation. - Pulmonary arteries: PA peak pressure: 62 mm Hg (S). - Pericardium, extracardiac: A trivial pericardial effusion was identified posterior to the heart.  Impressions:  - The right ventricular systolic pressure was increased consistent with moderate pulmonary hypertension.  Medications: I have reviewed the patient's current medications. Scheduled Meds: . antiseptic oral rinse  7 mL Mouth Rinse BID  . brimonidine  1 drop Left Eye BID  . dorzolamide  1 drop Left Eye BID  . feeding supplement (GLUCERNA SHAKE)  237 mL Oral Q24H  . furosemide  80 mg Intravenous Q12H  . insulin aspart  0-9 Units Subcutaneous TID WC  . insulin  glargine  15 Units Subcutaneous QHS  . lisinopril  20 mg Oral Daily  . metolazone  2.5 mg Oral Daily  . metoprolol succinate  100 mg Oral Daily  . mometasone-formoterol  2 puff Inhalation BID  . multivitamin with minerals  1 tablet Oral Daily  . rivaroxaban  20 mg Oral Q supper  . sodium chloride  3 mL Intravenous Q12H   Continuous Infusions:  PRN Meds:.acetaminophen, acetaminophen, benzonatate, ipratropium-albuterol, polyethylene glycol  Assessment/Plan: Principal Problem:   Asthma with acute exacerbation in adult Active Problems:   DIABETES MELLITUS, TYPE II   HYPERTENSION   Acute on chronic combined systolic and diastolic congestive heart failure   Atrial fibrillation   Valvular heart disease   Current use of long term anticoagulation--xarelto  Mild asthma exacerbation with concomitant combined CHF exacerbation: Patient presented with worsening dyspnea, productive cough and hemoptysis 6 days ago. Repeat CXR showed CHF with mild interstitial edema and possible pericardial effusion. Oxygen saturation overnight has ranged from 94-96% on 2L [place] via [place]. Net I/Os from 8/24 was -1990. BUN/Cr today is 23/0.78. Dry weight appears to be 152 lbs from previous clinic visits. Most recent hospital weight is 156 lbs. Today, patient was not in distress and had no acute complaints. No crackles or wheezes auscultated. We consulted Cardiology for Heart Failure management and Dr. Gala Romney recommended increasing Lasix to 80 mg IV BID and adding Metolazone 2.5 mg QD. Plan to d/c Metolazone tomorrow. Metoprolol dose was increased 100 mg and HR was 68-78 overnight with tachycardia noted this morning. Cards recommended continue  qd dosing. Echo results returned showing a trivial pericardial effusion on the posterior heart with severe tricuspid regurg, moderate pulm HTN, and mild aortic and mitral regurg. Will continue diuresis today, aim for discharge tomorrow. Needs outpatient follow up with cards to  determine if further workup is needed. Spironolactone 25 mg started today. K+ was low today - 40 mEq K-Dur given. -Lasix 80 mg po BID  -Metolazone 2.5 mg QD for 1 additional day  -Continue lisinopril 20 mg daily  -Continue metoprolol XR 100 mg daily  -Duoneb (ipratropium-albuterol) Q4H prn wheezing, shortness of breath  -Dulera (mometasone-formoterol) 2 puffs BID  -Cardiac monitoring  -Daily weights  -Cardiac Diet  -I/Os  -Oxygen via  2L  -Continue aggressive diuresis and monitor BUN/Cr  -Heart Failure Team following  -Echocardiogram results returned - will pursue further workup as an outpatient.  Permanent Atrial Fibrillation: Patient is on metoprolol 50 mg daily and Xarelto 20 mg daily at home. HR normally 80-120s, episodes up to 150s-160s. ncreased metoprolol dose to 100 mg yesterday -  HR overnight ranged from 68-78, tachycardia noted this morning into 130's. -Xarelto 20 mg daily with dinner  -Cardiac monitoring   DM type II: Patient is on metformin 1 gram BID at home. Most recent HgbA1C was 6.7 on 11/24/13. Glucose was 108 this morning. -Lantus 15 Units QHS  -SSI-sensitive  -CBG checks 4 times daily  -Cardiac diet   Hypokalemia: Patient's potassium was 3.3 today. Patient has chronically low-normal potassium, baseline ~3.6, most likely contributed to by Lasix at home. Magnesium and phosphorus are normal.  -40 mEq K-Dur ordered  DVT/PE ppx: Xarelto 20 mg daily   Dispo: Disposition is deferred at this time, awaiting improvement of current medical problems. Anticipated discharge in approximately 1 day.   The patient does have a current PCP Yolanda Manges, DO) and does need an [facility] hospital follow-up appointment after discharge.   The patient does not have transportation limitations that hinder transportation to clinic appointments.  This is a Psychologist, occupational Note.  The care of the patient was discussed with Dr. Senaida Ores and the assessment and plan formulated with their  assistance.  Please see their attached note for official documentation of the daily encounter.   LOS: 6 days   Alford Highland, Med Student 04/11/2014, 8:48 AM

## 2014-04-11 NOTE — Progress Notes (Signed)
    SUBJECTIVE:  No SOB.  No distress   PHYSICAL EXAM Filed Vitals:   04/10/14 1616 04/10/14 2035 04/11/14 0451 04/11/14 0738  BP: 130/60 132/64 128/68   Pulse: 136 78 66   Temp:  98.7 F (37.1 C) 98.4 F (36.9 C)   TempSrc:  Oral Oral   Resp:  18 20   Height:      Weight:   155 lb 1.2 oz (70.34 kg)   SpO2:  95% 96% 94%   General:  No acute distress Lungs:  Few scattered crackles Heart:  Irregular Abdomen:  Positive bowel sounds, no rebound no guarding Extremities:  No edema   LABS:  Results for orders placed during the hospital encounter of 04/05/14 (from the past 24 hour(s))  GLUCOSE, CAPILLARY     Status: Abnormal   Collection Time    04/10/14 12:08 PM      Result Value Ref Range   Glucose-Capillary 157 (*) 70 - 99 mg/dL  GLUCOSE, CAPILLARY     Status: None   Collection Time    04/10/14  5:03 PM      Result Value Ref Range   Glucose-Capillary 76  70 - 99 mg/dL  GLUCOSE, CAPILLARY     Status: Abnormal   Collection Time    04/10/14  9:57 PM      Result Value Ref Range   Glucose-Capillary 161 (*) 70 - 99 mg/dL  BASIC METABOLIC PANEL     Status: Abnormal   Collection Time    04/11/14  4:59 AM      Result Value Ref Range   Sodium 140  137 - 147 mEq/L   Potassium 3.3 (*) 3.7 - 5.3 mEq/L   Chloride 92 (*) 96 - 112 mEq/L   CO2 38 (*) 19 - 32 mEq/L   Glucose, Bld 108 (*) 70 - 99 mg/dL   BUN 23  6 - 23 mg/dL   Creatinine, Ser 2.48  0.50 - 1.10 mg/dL   Calcium 8.9  8.4 - 25.0 mg/dL   GFR calc non Af Amer 74 (*) >90 mL/min   GFR calc Af Amer 85 (*) >90 mL/min   Anion gap 10  5 - 15  GLUCOSE, CAPILLARY     Status: Abnormal   Collection Time    04/11/14  5:59 AM      Result Value Ref Range   Glucose-Capillary 104 (*) 70 - 99 mg/dL    Intake/Output Summary (Last 24 hours) at 04/11/14 0923 Last data filed at 04/11/14 0847  Gross per 24 hour  Intake    480 ml  Output   2375 ml  Net  -1895 ml    ASSESSMENT AND PLAN:    ATRIAL FIB:  Rate was increased  yesterday.  I increased the beta blocker.  Rate is better controlled today.  Continue current therapy.   Continue Xarelto.    ACUTE ON CHRONIC DIASTOLIC HF:    Weight is not quite at the 152 goal but I will switch to PO diuretic today.   Fayrene Fearing [facility] 04/11/2014 9:23 AM

## 2014-04-11 NOTE — Progress Notes (Signed)
Physical Therapy Treatment Patient Details Name: [name] MRN: [id number] DOB: 28-May-1926 Today's Date: 04/11/2014    History of Present Illness Adm 04/05/14 with dyspnea found to be due to both asthma and CHF exacerbation. PMHx- CHF with EF 40%, asthma, Graves disease, afib, DM, osteopenia, vasculitis, Rt hip hemiarthroplasty     PT Comments    Pt up in chair and reporting fatigue with desire to return to bed on arrival. Nursing confirmed she had been up for awhile and walked with nursing. Pt agreed to use [facility] and took 2 short walks in her room prior to returning to bed. She continues to need instructional cues for safe use of RW and assist for weight shifting over her feet to maintain her balance. Per pt, family can provide this assist on d/c (as they did PTA, per pt).    Follow Up Recommendations  Home health PT;Supervision/Assistance - 24 hour     Equipment Recommendations  None recommended by PT    Recommendations for Other Services       Precautions / Restrictions Precautions Precautions: Fall    Mobility  Bed Mobility Overal bed mobility: Needs Assistance Bed Mobility: Sit to Supine       Sit to supine: Supervision   General bed mobility comments: vc for repositioning in the bed, however no physical assist  Transfers Overall transfer level: Needs assistance Equipment used: Rolling walker (2 wheeled);None Transfers: Sit to/from [employer] to Stand: Min assist Stand pivot transfers: Min assist       General transfer comment: pt asked to transfer to [facility] without RW and held to armrests of each--pt clearly unsteady and anxious as she had to transition her hands and move her feet between chair and BSC; much less hesitant and better balance with use of RW x 2  Ambulation/Gait Ambulation/Gait assistance: Min assist Ambulation Distance (Feet): 12 Feet (5 ft, then 12 ft) Assistive device: Rolling walker (2 wheeled) Gait Pattern/deviations:  Step-through pattern;Decreased stride length;Trunk flexed   Gait velocity interpretation: <1.8 ft/sec, indicative of risk for recurrent falls General Gait Details: vc for safe use of RW (especially to move it around obstacles that presented a potential fall hazard   Stairs            Wheelchair Mobility    Modified Rankin (Stroke Patients Only)       Balance   Sitting-balance support: No upper extremity supported;Feet supported Sitting balance-Leahy Scale: Fair     Standing balance support: Bilateral upper extremity supported Standing balance-Leahy Scale: Poor                      Cognition Arousal/Alertness: Awake/alert Behavior During Therapy: WFL for tasks assessed/performed Overall Cognitive Status: Within Functional Limits for tasks assessed                      Exercises      General Comments        Pertinent Vitals/Pain Pain Assessment: No/denies pain  See RN note this date re: oxygen needs while ambulating    Home Living                      Prior Function            PT Goals (current goals can now be found in the care plan section) Acute Rehab PT Goals Patient Stated Goal: return home  Progress towards PT goals: Progressing toward goals  Frequency  Min 3X/week    PT Plan Current plan remains appropriate    Co-evaluation             End of Session Equipment Utilized During Treatment: Gait belt;Oxygen Activity Tolerance: Patient limited by fatigue Patient left: in bed;with call bell/phone within reach     Time: 1339-1405 PT Time Calculation (min): 26 min  Charges:  $Gait Training: 23-37 mins                    G Codes:      [name] 04-20-2014, 3:41 PM Pager [phone]

## 2014-04-11 NOTE — Progress Notes (Signed)
SATURATION QUALIFICATIONS: (This note is used to comply with regulatory documentation for home oxygen)  Patient Saturations on Room Air at Rest =90%  Patient Saturations on Room Air while Ambulating = 80-84 chair to ambulation  Patient Saturations on 2Liters of oxygen while Ambulating 96% briefly explain why patient needs home oxygen: Desats on chair to ambulation Recovred sat 2 2l/m [place]

## 2014-04-11 NOTE — Progress Notes (Signed)
  PROGRESS NOTE MEDICINE TEACHING ATTENDING   Day 6 of stay Patient name: [name]   Medical record number: [id number] Date of birth: March 21, 1926   I saw the patient today. She was sleeping comfortably in her bed, without her oxygen and had no respiratory distress without it. She does not complain of shortness of breath, however she feels exhausted and spent out. Exam - Irregular irregular rate, no tachycardia. Lungs clear to auscultation.   Assessment and Plan                                                                      Echo results reviewed. Decreased EF to 35-40% and a new area of akinesis of the basal-midinferoseptal and entire inferior myocardium. Appreciate cardiology input. I agree we can switch to PO lasix 80 BID, and we have already stopped metolazone. We will continue increased dose of beta blockers (although this might be the reason for her exhaustion - decreased CO). She should be able to adjust to this in a few weeks hopefully. Continue to monitor for bradycardia.  I would not add spironolactone yet. Wean off oxygen and monitor ambulatory saturations. Blood sugars have comeback within range once steroids have been stopped.    I have discussed the care of this patient with my IM team residents - Dr Jill Alexanders and Dr Dow Adolph. Please see the resident note for details.  Lateefa Crosby 04/11/2014, 11:18 AM.

## 2014-04-11 NOTE — Progress Notes (Addendum)
Subjective:  Patient was seen and examined this morning. Patient states she is doing well. She states her shortness of breath is improved, but she occasionally will hear an audible wheeze. Patient continues to have an occasional cough. She complains of frequent urination, and feels that her arms and legs are less swollen. She admits to rapid heart rate but states it is better than yesterday. She is tolerating a regular diet and having regular bowel movements. She denies fever, chills, shortness of breath, chest pain, nausea, vomiting or abdominal pain.  Objective: Vital signs in last 24 hours: Filed Vitals:   04/10/14 2035 04/11/14 0451 04/11/14 0738 04/11/14 1034  BP: 132/64 128/68  115/75  Pulse: 78 66  98  Temp: 98.7 F (37.1 C) 98.4 F (36.9 C)    TempSrc: Oral Oral    Resp: 18 20  16   Height:      Weight:  70.34 kg (155 lb 1.2 oz)    SpO2: 95% 96% 94% 99%   Weight change: -0.421 kg (-14.9 oz)  Intake/Output Summary (Last 24 hours) at 04/11/14 1141 Last data filed at 04/11/14 0847  Gross per 24 hour  Intake    480 ml  Output   2375 ml  Net  -1895 ml    General: Vital signs reviewed. Patient is an elderly female in no acute distress and cooperative with exam.  Cardiovascular: Irregularly irregular, tachycardic, 2/6 blowing holosystolic murmur heard best at the apex.  Pulmonary/Chest: Clear to auscultation bilaterally, decreased breath sounds, inconsistent wheeze heard in left upper lung field, but no rhonchi or crackles. No accessory muscle use. Abdominal: Soft, non-tender, non-distended, BS +, no suprapubic tenderness. Musculoskeletal: No joint deformities, erythema, or stiffness, ROM full and nontender.  Extremities: 1+ pitting edema in her left lower extremity extending up to mid-shin, trace pitting edema in her right lower extremity extending to her ankle, no calf tenderness, pulses symmetric and intact bilaterally.  Neurological: AA&O x3, no focal motor deficit, sensory  intact to light touch bilaterally.  Skin: Warm, dry and intact. No rashes or erythema. Psychiatric: Normal mood and affect. speech and behavior is normal. Cognition and memory are normal.   Lab Results: Basic Metabolic Panel:  Recent Labs Lab 04/07/14 1003 04/07/14 1015  04/10/14 0340 04/11/14 0459  NA 136*  --   < > 140 140  K 3.5*  --   < > 3.7 3.3*  CL 96  --   < > 98 92*  CO2 27  --   < > 33* 38*  GLUCOSE 272*  --   < > 121* 108*  BUN 17  --   < > 23 23  CREATININE 0.70  --   < > 0.74 0.78  CALCIUM 9.1  --   < > 8.4 8.9  MG  --  1.7  --   --   --   PHOS 2.9  --   --   --   --   < > = values in this interval not displayed. Liver Function Tests:  Recent Labs Lab 04/05/14 1140  AST 50*  ALT 8  ALKPHOS 86  BILITOT 1.4*  PROT 7.4  ALBUMIN 3.5   CBC:  Recent Labs Lab 04/05/14 0839 04/05/14 0904  WBC 3.2*  --   NEUTROABS 1.6*  --   HGB 12.5 14.6  HCT 38.6 43.0  MCV 91.9  --   PLT 198  --    Cardiac Enzymes:  Recent Labs Lab 04/05/14 1359 04/05/14  1600 04/05/14 2300  TROPONINI <0.30 <0.30 <0.30   BNP:  Recent Labs Lab 04/05/14 0855  PROBNP 4201.0*   CBG:  Recent Labs Lab 04/10/14 0515 04/10/14 1208 04/10/14 1703 04/10/14 2157 04/11/14 0559 04/11/14 1058  GLUCAP 105* 157* 76 161* 104* 166*   Hemoglobin A1C:  Recent Labs Lab 04/05/14 1140  HGBA1C 7.9*   Studies/Results: No results found. Medications: I have reviewed the patient's current medications.  Prescriptions prior to admission  Medication Sig Dispense Refill  . albuterol (PROVENTIL HFA;VENTOLIN HFA) 108 (90 BASE) MCG/ACT inhaler Inhale 1-2 puffs into the lungs every 6 (six) hours as needed for shortness of breath.  18 g  11  . brimonidine (ALPHAGAN P) 0.1 % SOLN Place 1 drop into the left eye every 12 (twelve) hours.  15 mL  1  . dorzolamide (TRUSOPT) 2 % ophthalmic solution Place 1 drop into the left eye 2 (two) times daily.       . Fluticasone-Salmeterol (ADVAIR) 250-50  MCG/DOSE AEPB Inhale 1 puff into the lungs 2 (two) times daily.  60 each  11  . furosemide (LASIX) 40 MG tablet Take 1 tablet (40 mg total) by mouth 2 (two) times daily.  60 tablet  2  . lisinopril (PRINIVIL,ZESTRIL) 20 MG tablet Take 1 tablet (20 mg total) by mouth daily.  30 tablet  3  . metFORMIN (GLUCOPHAGE) 1000 MG tablet Take 1 tablet (1,000 mg total) by mouth 2 (two) times daily with a meal.  60 tablet  3  . metoprolol succinate (TOPROL-XL) 50 MG 24 hr tablet Take 1 tablet (50 mg total) by mouth daily. Take with or immediately following a meal.  30 tablet  3  . potassium chloride SA (K-DUR,KLOR-CON) 20 MEQ tablet Take 2 tablets (40 mEq total) by mouth daily.  60 tablet  2  . rivaroxaban (XARELTO) 20 MG TABS tablet Take 1 tablet (20 mg total) by mouth every morning.  30 tablet  3    Scheduled Meds: . antiseptic oral rinse  7 mL Mouth Rinse BID  . brimonidine  1 drop Left Eye BID  . dorzolamide  1 drop Left Eye BID  . feeding supplement (GLUCERNA SHAKE)  237 mL Oral Q24H  . furosemide  80 mg Oral BID  . insulin aspart  0-9 Units Subcutaneous TID WC  . insulin glargine  15 Units Subcutaneous QHS  . lisinopril  20 mg Oral Daily  . metolazone  2.5 mg Oral Daily  . metoprolol succinate  100 mg Oral Daily  . mometasone-formoterol  2 puff Inhalation BID  . multivitamin with minerals  1 tablet Oral Daily  . potassium chloride  40 mEq Oral Once  . rivaroxaban  20 mg Oral Q supper  . sodium chloride  3 mL Intravenous Q12H  . spironolactone  12.5 mg Oral Daily   Continuous Infusions:  PRN Meds:.acetaminophen, acetaminophen, benzonatate, ipratropium-albuterol, polyethylene glycol  Assessment/Plan: Mild Asthma Exacerbation with concomitant combined CHF exacerbation: Asthma exacerbation has resolved. Dyspnea and volume status are improving with lasix and metolazone. Net I/Os yesterday were -1,990. Weight is 70.34 kg (155 lbs), down from 72.3 on admission, but still above her baseline (152  lbs). BUN/Cr are still within normal limits, 23/0.78, without worsening. BP has been stable in the 130s systolic. Echocardiogram showed reduced systolic function with EF of 35-40%, akinesis of the entire anteroseptal myocardium and inferior myocardium, AR, MR, TR, PR, and moderate pulmonary hypertension. This is a deterioration based on previous echo in 08/19/13 which showed  EF 40-45%, no regional wall abnormalities and no akinesis. Cardiology recommended discontinuing metolazone on discharge, transitioning to po lasix, continuing metoprolol XR 100 mg daily, and following up with cardiology as outpatient. -Continue lisinopril 20 mg daily  -Continue metoprolol XR 100 mg daily  -Duoneb (ipratropium-albuterol) Q4H prn wheezing, shortness of breath  -Dulera (mometasone-formoterol) 2 puffs BID  -Cardiac monitoring  -Daily weights  -Cardiac Diet  -I/Os  -Oxygen via [place] 2L --> will attempt to wean today -Lasix 80 mg po BID -Metolazone 2.5 mg today, we will d/c on discharge -Started patient on Spironolactone 12.5 mg daily, will titrate up at tolerated -Monitor BUN/Cr -Heart Failure Team following -Will set patient up with outpatient cardiology follow up -PT, will ambulate patient and measure pulse ox to assess need for home oxygen  Permanent Atrial Fibrillation: Patient is on metoprolol 50 mg daily and Xarelto 20 mg daily at home. HR normally 80-120s, episodes up to 150s-160s yesterday. We increased metoprolol and HR has been 60s to 130s.  -Continue metoprolol XR 100 mg  -Xarelto 20 mg daily with dinner -Cardiac monitoring  DM type II: Patient is on metformin 1 gram BID at home. Most recent HgbA1C was 6.7 on 11/24/13. CBGs in the 100s-160s. -Lantus 15 Units QHS -SSI-sensitive  -CBG checks 4 times daily  -Cardiac diet   Hypokalemia: Patient's potassium is 3.3 today. Patient has chronically low-normal potassium, baseline ~3.6, most likely contributed to by Lasix at home. Magnesium and phosphorus are  normal. I gave an additional Kdur 40 mEq in the setting of low normal potassium and continued aggressive IV lasix today. -Now on spironolactone, patient may not require home potassium supplementation -KDur 40 mEq once -BMET at noon and in the am  Graves disease s/p thyroidectomy: Patient has a history of Graves disease s/p thyroidectomy. Her most recent TSH was normal at 1.410 on 11/24/13. It appears her TSH levels have been WNL since 2008.  -Stable   HLD: Most recent lipid panel on 03/16/13 showed cholesterol 132, TG 74, HDL 54, and LDL 63. Patient is not currently on a statin at home. Repeat lipid panel shows cholesterol 129, TG 52, HDL 54, LDL 65.  -Stable  DVT/PE ppx: Xarelto 20 mg daily  Dispo: Disposition is deferred at this time, awaiting improvement of current medical problems.  Anticipated discharge in approximately 1-2 day(s).   The patient does have a current PCP Yolanda Manges, DO) and does need an [facility] hospital follow-up appointment after discharge.  The patient does not have transportation limitations that hinder transportation to clinic appointments.  .Services Needed at time of discharge: Y = Yes, Blank = No PT:   OT:   RN:   Equipment:   Other:     LOS: 6 days   Jill Alexanders, DO PGY-1 Internal Medicine Resident Pager # [phone] 04/11/2014 11:41 AM

## 2014-04-12 ENCOUNTER — Encounter: Payer: PRIVATE HEALTH INSURANCE | Admitting: Internal Medicine

## 2014-04-12 LAB — CBC WITH DIFFERENTIAL/PLATELET
Basophils Absolute: 0 10*3/uL (ref 0.0–0.1)
Basophils Relative: 1 % (ref 0–1)
Eosinophils Absolute: 0.2 10*3/uL (ref 0.0–0.7)
Eosinophils Relative: 5 % (ref 0–5)
HCT: 46.6 % — ABNORMAL HIGH (ref 36.0–46.0)
Hemoglobin: 15 g/dL (ref 12.0–15.0)
LYMPHS PCT: 34 % (ref 12–46)
Lymphs Abs: 1.4 10*3/uL (ref 0.7–4.0)
MCH: 29.6 pg (ref 26.0–34.0)
MCHC: 32.2 g/dL (ref 30.0–36.0)
MCV: 92.1 fL (ref 78.0–100.0)
Monocytes Absolute: 0.6 10*3/uL (ref 0.1–1.0)
Monocytes Relative: 15 % — ABNORMAL HIGH (ref 3–12)
Neutro Abs: 1.8 10*3/uL (ref 1.7–7.7)
Neutrophils Relative %: 45 % (ref 43–77)
PLATELETS: 277 10*3/uL (ref 150–400)
RBC: 5.06 MIL/uL (ref 3.87–5.11)
RDW: 14.7 % (ref 11.5–15.5)
WBC: 4.1 10*3/uL (ref 4.0–10.5)

## 2014-04-12 LAB — CLOSTRIDIUM DIFFICILE BY PCR: Toxigenic C. Difficile by PCR: NEGATIVE

## 2014-04-12 LAB — BASIC METABOLIC PANEL
Anion gap: 16 — ABNORMAL HIGH (ref 5–15)
BUN: 24 mg/dL — ABNORMAL HIGH (ref 6–23)
CO2: 31 mEq/L (ref 19–32)
Calcium: 8.7 mg/dL (ref 8.4–10.5)
Chloride: 88 mEq/L — ABNORMAL LOW (ref 96–112)
Creatinine, Ser: 0.67 mg/dL (ref 0.50–1.10)
GFR, EST AFRICAN AMERICAN: 90 mL/min — AB (ref >90)
GFR, EST NON AFRICAN AMERICAN: 77 mL/min — AB (ref >90)
Glucose, Bld: 131 mg/dL — ABNORMAL HIGH (ref 70–99)
Potassium: 3.6 mEq/L — ABNORMAL LOW (ref 3.7–5.3)
SODIUM: 135 meq/L — AB (ref 137–147)

## 2014-04-12 LAB — GLUCOSE, CAPILLARY
GLUCOSE-CAPILLARY: 128 mg/dL — AB (ref 70–99)
GLUCOSE-CAPILLARY: 151 mg/dL — AB (ref 70–99)
Glucose-Capillary: 161 mg/dL — ABNORMAL HIGH (ref 70–99)

## 2014-04-12 MED ORDER — SODIUM CHLORIDE 0.9 % IV BOLUS (SEPSIS): 250.0000 mL | Freq: Once | INTRAVENOUS | Status: DC

## 2014-04-12 MED ORDER — LISINOPRIL 10 MG PO TABS
10.0000 mg | ORAL_TABLET | Freq: Every day | ORAL | Status: DC
  Administered 2014-04-12 – 2014-04-13 (×2): 10 mg via ORAL
  Filled 2014-04-12 (×2): qty 1

## 2014-04-12 MED ORDER — POTASSIUM CHLORIDE CRYS ER 20 MEQ PO TBCR: 40.0000 meq | EXTENDED_RELEASE_TABLET | Freq: Once | ORAL | Status: DC

## 2014-04-12 MED ORDER — LOPERAMIDE HCL 2 MG PO CAPS
4.0000 mg | ORAL_CAPSULE | Freq: Once | ORAL | Status: AC
  Administered 2014-04-12: 4 mg via ORAL
  Filled 2014-04-12: qty 2

## 2014-04-12 MED ORDER — FUROSEMIDE 40 MG PO TABS: 40.0000 mg | ORAL_TABLET | Freq: Every day | ORAL | Status: DC

## 2014-04-12 MED ORDER — POTASSIUM CHLORIDE CRYS ER 20 MEQ PO TBCR
20.0000 meq | EXTENDED_RELEASE_TABLET | Freq: Once | ORAL | Status: AC
  Administered 2014-04-12: 20 meq via ORAL
  Filled 2014-04-12: qty 1

## 2014-04-12 NOTE — Progress Notes (Signed)
Subjective: [name] is an 80 YO woman with a PMH of asthma, CHF, A fib, DM2, and HLD on HD7 who initially presented with SOB, wheezing, and two brief episodes of chest pain. Overnight, the patient reports that she slept well and that her wheezing and chest pain have completely resolved. She reports feeling weak and that she is hesitant to go home today. She denies any chest pain, fever, nausea, or vomiting. She is tolerating a normal diet, but reports some difficulty with feeding herself today. She reports two loose stools over the past 24 hours.  Objective: Vital signs in last 24 hours: Filed Vitals:   04/11/14 2113 04/12/14 0500 04/12/14 0904 04/12/14 0917  BP:  113/69  92/57  Pulse:  70  94  Temp:  98.3 F (36.8 C)    TempSrc:  Oral    Resp:  18  16  Height:      Weight:  64.2 kg (141 lb 8.6 oz)    SpO2: 100% 100% 97% 97%   Weight change: -6.14 kg (-13 lb 8.6 oz)  Intake/Output Summary (Last 24 hours) at 04/12/14 0929 Last data filed at 04/12/14 8921  Gross per 24 hour  Intake    820 ml  Output   1100 ml  Net   -280 ml   BP 92/57  Pulse 94  Temp(Src) 98.3 F (36.8 C) (Oral)  Resp 16  Ht 5\' 5"  (1.651 m)  Wt 64.2 kg (141 lb 8.6 oz)  BMI 23.55 kg/m2  SpO2 97% General appearance: alert, cooperative, fatigued and no distress Head: Normocephalic, without obvious abnormality, atraumatic Lungs: clear to auscultation bilaterally Heart: irregularly irregular rhythm and normal rate; 3/6 holocystolic murmur appreciated at L sternal margin, 2/6 holosystolic murmur best heard at apex Abdomen: soft, non-tender; bowel sounds normal; no masses,  no organomegaly Extremities: edema 1+ L leg, trace R leg Lab Results: Basic Metabolic Panel:  Recent Labs Lab 04/07/14 1003 04/07/14 1015  04/11/14 0459 04/11/14 1244  NA 136*  --   < > 140 139  K 3.5*  --   < > 3.3* 3.5*  CL 96  --   < > 92* 91*  CO2 27  --   < > 38* 38*  GLUCOSE 272*  --   < > 108* 120*  BUN 17  --   < > 23  24*  CREATININE 0.70  --   < > 0.78 0.71  CALCIUM 9.1  --   < > 8.9 9.0  MG  --  1.7  --   --   --   PHOS 2.9  --   --   --   --   < > = values in this interval not displayed.  Studies/Results:  2D Echo with contrast -  Study Conclusions  - Left ventricle: The cavity size was normal. Systolic function was moderately reduced. The estimated ejection fraction was in the range of 35% to 40%. There is akinesis of the entireanteroseptal myocardium. There is akinesis of the basal-midinferoseptal myocardium. There is akinesis of the entireinferior myocardium. - Aortic valve: There was mild regurgitation. - Aorta: Aortic root dimension: 36 mm (ED). - Ascending aorta: The ascending aorta was mildly dilated. - Mitral valve: There was mild regurgitation. - Left atrium: The atrium was severely dilated. - Right atrium: The atrium was severely dilated. - Tricuspid valve: There was severe regurgitation. - Pulmonic valve: There was trivial regurgitation. - Pulmonary arteries: PA peak pressure: 62 mm Hg (S). - Pericardium, extracardiac:  A trivial pericardial effusion was identified posterior to the heart.  Impressions:  - The right ventricular systolic pressure was increased consistent with moderate pulmonary hypertension.  CBC:  Recent Labs Lab 04/11/14 1244 04/12/14 0826  WBC 4.1 4.1  NEUTROABS  --  1.8  HGB 13.7 15.0  HCT 42.0 46.6*  MCV 93.5 92.1  PLT 320 277   CBG:  Recent Labs Lab 04/10/14 2157 04/11/14 0559 04/11/14 1058 04/11/14 1633 04/11/14 2047 04/12/14 0604  GLUCAP 161* 104* 166* 96 213* 128*    Micro Results: Recent Results (from the past 240 hour(s))  CLOSTRIDIUM DIFFICILE BY PCR     Status: None   Collection Time    04/11/14  5:26 PM      Result Value Ref Range Status   C difficile by pcr NEGATIVE  NEGATIVE Final   Medications: I have reviewed the patient's current medications. Scheduled Meds: . antiseptic oral rinse  7 mL Mouth Rinse BID  .  brimonidine  1 drop Left Eye BID  . dorzolamide  1 drop Left Eye BID  . feeding supplement (GLUCERNA SHAKE)  237 mL Oral Q24H  . furosemide  80 mg Oral BID  . insulin aspart  0-9 Units Subcutaneous TID WC  . insulin glargine  15 Units Subcutaneous QHS  . lisinopril  20 mg Oral Daily  . metolazone  2.5 mg Oral Daily  . metoprolol succinate  100 mg Oral Daily  . mometasone-formoterol  2 puff Inhalation BID  . multivitamin with minerals  1 tablet Oral Daily  . rivaroxaban  20 mg Oral Q supper  . sodium chloride  3 mL Intravenous Q12H  . spironolactone  12.5 mg Oral Daily   Continuous Infusions:  PRN Meds:.acetaminophen, acetaminophen, benzonatate, ipratropium-albuterol, polyethylene glycol  Assessment/Plan: Principal Problem:   Asthma with acute exacerbation in adult Active Problems:   DIABETES MELLITUS, TYPE II   HYPERTENSION   Acute on chronic combined systolic and diastolic congestive heart failure   Atrial fibrillation   Valvular heart disease   Current use of long term anticoagulation--xarelto  Mild asthma exacerbation with concomitant combined CHF exacerbation:  Patient presented with worsening dyspnea, productive cough and hemoptysis 7 days ago. Repeat CXR showed CHF with mild interstitial edema and possible pericardial effusion. Oxygen saturation overnight has ranged from 97-100% on 2L oyxgen via [place]. Net I/Os from 8/2 was -535. BUN/Cr today is 24/0.67. Dry weight appears to be 152 lbs from previous clinic visits. Most recent hospital weight is 141 lbs, likely measured in error. Repeat weight reordered. BP 92/57 at 0955 - given 250 mL NS bolus to help correct potential hypovolemia. Today, patient was not in distress and had no acute complaints other than fatigue. No crackles or wheezes auscultated. Transitioned to PO Lasix 80 mg bid yesterday. D/c Metolazone 2.5 mg today. Metoprolol dose maintained at 100 mg and HR ranged from 70-98 overnight. Echo results returned showing a  trivial pericardial effusion on the posterior heart with severe tricuspid regurg, moderate pulm HTN, and mild aortic and mitral regurg. Will continue diuresis today, aim for discharge tomorrow. Needs outpatient follow up with cards to determine if further workup is needed for echo findings. Spironolactone 12.5 mg started today. K+ was 3.6 today, will monitor. PT eval yesterday was notable for O2 desaturation to 80-84% on RA while ambulating. Will put in request for home O2. -Lasix 80 mg po BID  -Metolazone 2.5 mg - discontinued -250 mL NS bolus -Continue lisinopril 20 mg daily  -Continue metoprolol  XR 100 mg daily  -Duoneb (ipratropium-albuterol) Q4H prn wheezing, shortness of breath  -Dulera (mometasone-formoterol) 2 puffs BID  -Cardiac monitoring  -Daily weights  -Cardiac Diet  -I/Os  -Oxygen via [place] 2L  -Continue aggressive diuresis and monitor BUN/Cr  -Heart Failure Team following  -Echocardiogram results returned - will pursue further workup as an outpatient if needed. -Need home O2  Permanent Atrial Fibrillation: Metoprolol 100 mg qd started 2 days ago. HR overnight ranged from 70-98. Continue at current dose. -Xarelto 20 mg daily with dinner  -Cardiac monitoring   DM type II: Patient is on metformin 1 gram BID at home. Most recent HgbA1C was 6.7 on 11/24/13. Glucose was 128 this morning.  -Lantus 15 Units QHS  -SSI-sensitive  -CBG checks 4 times daily  -Cardiac diet   Hypokalemia: Patient's potassium was 3.5 today. Patient has chronically low-normal potassium, baseline ~3.6, most likely contributed to by Lasix at home. Magnesium and phosphorus are normal.  -Will monitor K+ level since addition of spironolactone yesterday  DVT/PE ppx: Xarelto 20 mg daily -  Patient was on 20 mg Xarelto prior to her PE in Jan 2015, however, she likely missed a few doses prior to PE. PE was believed to be provoked due to leg lesion and immobility. Pharmacy was consulted regarding the appropriate dose  upon discharge, taking into account her decreased creatinine clearance. Pharmacist recommended that 20 mg Xarelto be continued for DVT/PE prophylaxis and that she should be monitored for any signs/symptoms of bleeding.  Dispo: Disposition is deferred at this time, awaiting improvement of current medical problems. Anticipated discharge in approximately 1 day.   The patient does have a current PCP Yolanda Manges, DO) and does need an [facility] hospital follow-up appointment after discharge.   The patient does not have transportation limitations that hinder transportation to clinic appointments.  This is a Psychologist, occupational Note.  The care of the patient was discussed with Dr. Senaida Ores and the assessment and plan formulated with their assistance.  Please see their attached note for official documentation of the daily encounter.   LOS: 7 days   Alford Highland, Med Student 04/12/2014, 9:29 AM

## 2014-04-12 NOTE — Progress Notes (Signed)
1455 referred to MD re ; pt having diarrhea with orders . Pt informed

## 2014-04-12 NOTE — Care Management Note (Signed)
    Page 1 of 2   04/12/2014     3:22:46 PM CARE MANAGEMENT NOTE 04/12/2014  Patient:  [name], [name]   Account Number:  [id number]  Date Initiated:  04/12/2014  Documentation initiated by:  [facility]  Subjective/Objective Assessment:   80 y.o.african american female with asthma and combined heart failure, admitted with shortness of breath, thought to be secondary to a mixed asthma exacerbation and CHF exacerbation//Home with children     Action/Plan:   IV diureses.//Access for  [facility] services   Anticipated DC Date:  04/13/2014   Anticipated DC Plan:  HOME W HOME HEALTH SERVICES      DC Planning Services  CM consult      [facility] Choice  HOME HEALTH  DURABLE MEDICAL EQUIPMENT   Choice offered to / List presented to:     DME arranged  [facility]      DME agency  [facility]     [facility] arranged  HH-1 RN  HH-10 DISEASE MANAGEMENT  HH-2 PT      [facility] agency  [facility]   Status of service:  In process, will continue to follow Medicare Important Message given?  YES (If response is "NO", the following Medicare IM given date fields will be blank) Date Medicare IM given:  04/13/2014 Medicare IM given by:  [facility] Date Additional Medicare IM given:   Additional Medicare IM given by:    Discharge Disposition:    Per UR Regulation:  Reviewed for med. necessity/level of care/duration of stay  If discussed at Long Length of Stay Meetings, dates discussed:   04/13/2014    Comments:  04/12/14 1500 Oletta Cohn, RN, BSN, [employer] [phone] Spoke with pt at bedside regarding discharge planning for [facility]. Offered pt list of home health agencies to choose from.  Pt chose [facility] to render services. Kizzie Furnish, RN of [facility] notified.   DME needs identified at this time include oxygen.  Shaune Leeks of [facility] notified to deliver tank to pt room prior to d/c home.

## 2014-04-12 NOTE — Progress Notes (Signed)
SUBJECTIVE:  No SOB.  No distress.  More somnolent.    PHYSICAL EXAM Filed Vitals:   04/11/14 2113 04/12/14 0500 04/12/14 0904 04/12/14 0917  BP:  113/69  92/57  Pulse:  70  94  Temp:  98.3 F (36.8 C)    TempSrc:  Oral    Resp:  18  16  Height:      Weight:  141 lb 8.6 oz (64.2 kg)    SpO2: 100% 100% 97% 97%   General:  No acute distress Lungs:  Few scattered crackles Heart:  Irregular Abdomen:  Positive bowel sounds, no rebound no guarding Extremities:  No edema   LABS:  Results for orders placed during the hospital encounter of 04/05/14 (from the past 24 hour(s))  GLUCOSE, CAPILLARY     Status: Abnormal   Collection Time    04/11/14 10:58 AM      Result Value Ref Range   Glucose-Capillary 166 (*) 70 - 99 mg/dL   Comment 1 Notify RN    BASIC METABOLIC PANEL     Status: Abnormal   Collection Time    04/11/14 12:44 PM      Result Value Ref Range   Sodium 139  137 - 147 mEq/L   Potassium 3.5 (*) 3.7 - 5.3 mEq/L   Chloride 91 (*) 96 - 112 mEq/L   CO2 38 (*) 19 - 32 mEq/L   Glucose, Bld 120 (*) 70 - 99 mg/dL   BUN 24 (*) 6 - 23 mg/dL   Creatinine, Ser 0.21  0.50 - 1.10 mg/dL   Calcium 9.0  8.4 - 11.7 mg/dL   GFR calc non Af Amer 76 (*) >90 mL/min   GFR calc Af Amer 88 (*) >90 mL/min   Anion gap 10  5 - 15  CBC     Status: None   Collection Time    04/11/14 12:44 PM      Result Value Ref Range   WBC 4.1  4.0 - 10.5 K/uL   RBC 4.49  3.87 - 5.11 MIL/uL   Hemoglobin 13.7  12.0 - 15.0 g/dL   HCT 35.6  70.1 - 41.0 %   MCV 93.5  78.0 - 100.0 fL   MCH 30.5  26.0 - 34.0 pg   MCHC 32.6  30.0 - 36.0 g/dL   RDW 30.1  31.4 - 38.8 %   Platelets 320  150 - 400 K/uL  GLUCOSE, CAPILLARY     Status: None   Collection Time    04/11/14  4:33 PM      Result Value Ref Range   Glucose-Capillary 96  70 - 99 mg/dL   Comment 1 Notify RN    CLOSTRIDIUM DIFFICILE BY PCR     Status: None   Collection Time    04/11/14  5:26 PM      Result Value Ref Range   C difficile by pcr  NEGATIVE  NEGATIVE  GLUCOSE, CAPILLARY     Status: Abnormal   Collection Time    04/11/14  8:47 PM      Result Value Ref Range   Glucose-Capillary 213 (*) 70 - 99 mg/dL   Comment 1 Notify RN     Comment 2 Documented in Chart    GLUCOSE, CAPILLARY     Status: Abnormal   Collection Time    04/12/14  6:04 AM      Result Value Ref Range   Glucose-Capillary 128 (*) 70 - 99 mg/dL  CBC  WITH DIFFERENTIAL     Status: Abnormal   Collection Time    04/12/14  8:26 AM      Result Value Ref Range   WBC 4.1  4.0 - 10.5 K/uL   RBC 5.06  3.87 - 5.11 MIL/uL   Hemoglobin 15.0  12.0 - 15.0 g/dL   HCT 16.1 (*) 09.6 - 04.5 %   MCV 92.1  78.0 - 100.0 fL   MCH 29.6  26.0 - 34.0 pg   MCHC 32.2  30.0 - 36.0 g/dL   RDW 40.9  81.1 - 91.4 %   Platelets 277  150 - 400 K/uL   Neutrophils Relative % 45  43 - 77 %   Neutro Abs 1.8  1.7 - 7.7 K/uL   Lymphocytes Relative 34  12 - 46 %   Lymphs Abs 1.4  0.7 - 4.0 K/uL   Monocytes Relative 15 (*) 3 - 12 %   Monocytes Absolute 0.6  0.1 - 1.0 K/uL   Eosinophils Relative 5  0 - 5 %   Eosinophils Absolute 0.2  0.0 - 0.7 K/uL   Basophils Relative 1  0 - 1 %   Basophils Absolute 0.0  0.0 - 0.1 K/uL    Intake/Output Summary (Last 24 hours) at 04/12/14 7829 Last data filed at 04/12/14 5621  Gross per 24 hour  Intake    820 ml  Output   1100 ml  Net   -280 ml    ASSESSMENT AND PLAN:    ATRIAL FIB: Continue current therapy.   Continue Xarelto.    ACUTE ON CHRONIC DIASTOLIC HF:    Weight is probably not correct today.  However, she is hypotensive and had 4 liter net negative tomorrow.  Metolazone has been stopped.  I reduced the ACE inhibitor dose for today with a hold parameter and would hold the diuretic.     Fayrene Fearing [facility] 04/12/2014 9:29 AM

## 2014-04-12 NOTE — Progress Notes (Signed)
  PROGRESS NOTE MEDICINE TEACHING ATTENDING   Day 7 of stay Patient name: [name]   Medical record number: [id number] Date of birth: [birth year]    Feels exhausted. No bradycardia or tachycardia overnight. Relative hypotension this morning. Exam significant for no crackles on lungs, irregular irregular rhythm, and improved leg edema. Significant fluid loss yesterday - upto 4l.  Assessment and Plan                                                                      Agree with stopping metolazone, and holding lasix, and decreasing ACE inhibitor. I would also not start spironolactone right now, because she is having loose BMs, and low blood pressure. Can be restarted once the patient is more stable.   I have discussed the care of this patient with my IM team residents. Please see the resident note for details.  Aletta Edouard 04/12/2014, 12:42 PM.

## 2014-04-12 NOTE — Progress Notes (Signed)
Subjective:  Patient was seen and examined this morning. Patient complains of feeling more weak and fatigued than usual. She feels especially weak when she ambulates. She states she has been eating and sleeping well. Patient had an episode of diarrhea yesterday and again today. She denies any fever, chills, chest pain or shortness of breath, nausea, vomiting, or abdominal pain.   Objective: Vital signs in last 24 hours: Filed Vitals:   04/12/14 0500 04/12/14 0904 04/12/14 0917 04/12/14 1024  BP: 113/69  92/57 120/64  Pulse: 70  94 86  Temp: 98.3 F (36.8 C)   97.9 F (36.6 C)  TempSrc: Oral   Oral  Resp: 18  16 22   Height:      Weight: 64.2 kg (141 lb 8.6 oz)   66.4 kg (146 lb 6.2 oz)  SpO2: 100% 97% 97% 100%   Weight change: -6.14 kg (-13 lb 8.6 oz)  Intake/Output Summary (Last 24 hours) at 04/12/14 1051 Last data filed at 04/12/14 2505  Gross per 24 hour  Intake    820 ml  Output   1100 ml  Net   -280 ml    General: Vital signs reviewed. Patient is an elderly female in no acute distress and cooperative with exam. Patient appears more fatigued than normal, less talkative and alert. Cardiovascular: Irregularly irregular, regular rate, 2/6 blowing holosystolic murmur heard best at the apex.  Pulmonary/Chest: Clear to auscultation bilaterally, decreased breath sounds, no wheezing, rhonchi or crackles. No accessory muscle use. Abdominal: Soft, non-tender, non-distended, BS +, no suprapubic tenderness. Musculoskeletal: No joint deformities, erythema, or stiffness, ROM full and nontender.  Extremities: 2+ pitting edema in her left lower extremity extending up to ankle, trace-1+ pitting edema in her right lower extremity extending to her ankle, no calf tenderness, pulses symmetric and intact bilaterally.  Neurological: AA&O x3, no focal motor deficit, sensory intact to light touch bilaterally.  Skin: Warm, dry and intact. No rashes or erythema. Psychiatric: Normal mood and affect.  speech and behavior is normal. Cognition and memory are normal.   Lab Results: Basic Metabolic Panel:  Recent Labs Lab 04/07/14 1003 04/07/14 1015  04/11/14 1244 04/12/14 0826  NA 136*  --   < > 139 135*  K 3.5*  --   < > 3.5* 3.6*  CL 96  --   < > 91* 88*  CO2 27  --   < > 38* 31  GLUCOSE 272*  --   < > 120* 131*  BUN 17  --   < > 24* 24*  CREATININE 0.70  --   < > 0.71 0.67  CALCIUM 9.1  --   < > 9.0 8.7  MG  --  1.7  --   --   --   PHOS 2.9  --   --   --   --   < > = values in this interval not displayed. Liver Function Tests:  Recent Labs Lab 04/05/14 1140  AST 50*  ALT 8  ALKPHOS 86  BILITOT 1.4*  PROT 7.4  ALBUMIN 3.5   CBC:  Recent Labs Lab 04/11/14 1244 04/12/14 0826  WBC 4.1 4.1  NEUTROABS  --  1.8  HGB 13.7 15.0  HCT 42.0 46.6*  MCV 93.5 92.1  PLT 320 277   Cardiac Enzymes:  Recent Labs Lab 04/05/14 1359 04/05/14 1600 04/05/14 2300  TROPONINI <0.30 <0.30 <0.30   BNP: No results found for this basename: PROBNP,  in the last 168 hours CBG:  Recent Labs Lab 04/10/14 2157 04/11/14 0559 04/11/14 1058 04/11/14 1633 04/11/14 2047 04/12/14 0604  GLUCAP 161* 104* 166* 96 213* 128*   Hemoglobin A1C:  Recent Labs Lab 04/05/14 1140  HGBA1C 7.9*   Studies/Results: No results found. Medications: I have reviewed the patient's current medications.  Prescriptions prior to admission  Medication Sig Dispense Refill  . albuterol (PROVENTIL HFA;VENTOLIN HFA) 108 (90 BASE) MCG/ACT inhaler Inhale 1-2 puffs into the lungs every 6 (six) hours as needed for shortness of breath.  18 g  11  . brimonidine (ALPHAGAN P) 0.1 % SOLN Place 1 drop into the left eye every 12 (twelve) hours.  15 mL  1  . dorzolamide (TRUSOPT) 2 % ophthalmic solution Place 1 drop into the left eye 2 (two) times daily.       . Fluticasone-Salmeterol (ADVAIR) 250-50 MCG/DOSE AEPB Inhale 1 puff into the lungs 2 (two) times daily.  60 each  11  . furosemide (LASIX) 40 MG tablet  Take 1 tablet (40 mg total) by mouth 2 (two) times daily.  60 tablet  2  . lisinopril (PRINIVIL,ZESTRIL) 20 MG tablet Take 1 tablet (20 mg total) by mouth daily.  30 tablet  3  . metFORMIN (GLUCOPHAGE) 1000 MG tablet Take 1 tablet (1,000 mg total) by mouth 2 (two) times daily with a meal.  60 tablet  3  . metoprolol succinate (TOPROL-XL) 50 MG 24 hr tablet Take 1 tablet (50 mg total) by mouth daily. Take with or immediately following a meal.  30 tablet  3  . potassium chloride SA (K-DUR,KLOR-CON) 20 MEQ tablet Take 2 tablets (40 mEq total) by mouth daily.  60 tablet  2  . rivaroxaban (XARELTO) 20 MG TABS tablet Take 1 tablet (20 mg total) by mouth every morning.  30 tablet  3    Scheduled Meds: . antiseptic oral rinse  7 mL Mouth Rinse BID  . brimonidine  1 drop Left Eye BID  . dorzolamide  1 drop Left Eye BID  . feeding supplement (GLUCERNA SHAKE)  237 mL Oral Q24H  . insulin aspart  0-9 Units Subcutaneous TID WC  . insulin glargine  15 Units Subcutaneous QHS  . lisinopril  10 mg Oral Daily  . metoprolol succinate  100 mg Oral Daily  . mometasone-formoterol  2 puff Inhalation BID  . multivitamin with minerals  1 tablet Oral Daily  . potassium chloride  20 mEq Oral Once  . rivaroxaban  20 mg Oral Q supper  . sodium chloride  3 mL Intravenous Q12H  . spironolactone  12.5 mg Oral Daily   Continuous Infusions:  PRN Meds:.acetaminophen, acetaminophen, benzonatate, ipratropium-albuterol  Assessment/Plan: Acute on Chronic Combined Systolic and Diastolic CHF exacerbation: Asthma exacerbation has resolved. Dyspnea and volume status are improving with lasix and metolazone. Patient may have been over diuresed: her blood pressures this morning were 92/57 and 97/57 on repeat 40 minutes later. Her net I/Os yesterday were -535. Weight was measured at  64.2 kg (141 lbs), down from 72.3 kg on admission. This weight is likely incorrect and we rechecked her weight which was 66.4 kg. BUN/Cr are still within  normal limits, 24/0.67, without worsening. Patient is much more fatigued than normal. This may be secondary to diuresis, increase in carvedilol and reduced heart rate (70s to 90s) as compared to prior. I discussed the patient with Dr. Antoine Poche, Cardiology, in the patient's room. He recommended holding off on diuretics today, discontinuing metolazone and decreasing lisinopril to 10 mg daily.  Repeat BP was 120/64, HR 86. Patient was ambulated yesterday and was found to have 90% saturation at rest without oxygen, but desaturated to 80-84% on room air while ambulating. She saturated at 96% while ambulating with 2L of oxygen via [place]. Patient will need home oxygen.  -Decreased lisinopril to 10 mg for now -Continue metoprolol XR 100 mg daily  -Duoneb (ipratropium-albuterol) Q4H prn wheezing, shortness of breath  -Dulera (mometasone-formoterol) 2 puffs BID  -Cardiac monitoring  -Daily weights  -Vital signs Q2H -Cardiac Diet  -I/Os  -Oxygen via [place] 2L  -Hold Lasix today -Discontinue Metolazone -Spironolactone 12.5 mg daily, will titrate up at tolerated -Monitor BUN/Cr -Heart Failure Team following -Will set patient up with outpatient cardiology follow up -Home oxygen  Permanent Atrial Fibrillation: Patient is on metoprolol 50 mg daily and Xarelto 20 mg daily at home. HR normally 80-120s, episodes up to 150s-160s 2 days ago. We increased her metoprolol to 100 mg XR which decreased her HR to 70s-90s.  -Continue metoprolol XR 100 mg  -Xarelto 20 mg daily with dinner -Cardiac monitoring  Diarrhea: Patient had an episode of diarrhea yesterday and again today. C. Diff negative. Afebrile, no leukocytosis or abdominal pain. Patient is apparently lactose intolerant. We will adjust her diet and monitor. -Lactose Free Diet  DM type II: Patient is on metformin 1 gram BID at home. Most recent HgbA1C was 6.7 on 11/24/13. CBGs have ranged 96-213s for the past few days. -Lantus 15 Units QHS -SSI-sensitive  -CBG  checks 4 times daily  -Lactose free-carb modified diet  Hypokalemia: Patient's potassium is 3.6 today. Patient has chronically low-normal potassium, baseline ~3.6, most likely contributed to by Lasix at home. Magnesium and phosphorus are normal.  -Now on spironolactone, patient may not require home potassium supplementation -KDur 20 mEq once  DVT/PE ppx: Xarelto 20 mg daily  Dispo: Disposition is deferred at this time, awaiting improvement of current medical problems.  Anticipated discharge in approximately 1-2 day(s).   The patient does have a current PCP Yolanda Manges, DO) and does need an [facility] hospital follow-up appointment after discharge.  The patient does not have transportation limitations that hinder transportation to clinic appointments.  .Services Needed at time of discharge: Y = Yes, Blank = No PT:   OT:   RN:   Equipment:   Other:     LOS: 7 days   Jill Alexanders, DO PGY-1 Internal Medicine Resident Pager # [phone] 04/12/2014 10:51 AM

## 2014-04-12 NOTE — Progress Notes (Signed)
  I have seen and examined the patient, and reviewed the daily progress note by Alford Highland, MS III and discussed the care of the patient with them. Please see my progress note from 04/12/2014 for further details regarding assessment and plan.    Signed:  Jill Alexanders, DO PGY-1 Internal Medicine Resident Pager # [phone] 04/12/2014 11:33 AM

## 2014-04-13 LAB — GLUCOSE, CAPILLARY
GLUCOSE-CAPILLARY: 143 mg/dL — AB (ref 70–99)
Glucose-Capillary: 143 mg/dL — ABNORMAL HIGH (ref 70–99)
Glucose-Capillary: 72 mg/dL (ref 70–99)
Glucose-Capillary: 74 mg/dL (ref 70–99)
Glucose-Capillary: 258 mg/dL — ABNORMAL HIGH (ref 70–99)

## 2014-04-13 LAB — BASIC METABOLIC PANEL
ANION GAP: 10 (ref 5–15)
BUN: 22 mg/dL (ref 6–23)
CHLORIDE: 88 meq/L — AB (ref 96–112)
CO2: 36 meq/L — AB (ref 19–32)
Calcium: 8.7 mg/dL (ref 8.4–10.5)
Creatinine, Ser: 0.67 mg/dL (ref 0.50–1.10)
GFR calc Af Amer: 90 mL/min — ABNORMAL LOW (ref >90)
GFR calc non Af Amer: 77 mL/min — ABNORMAL LOW (ref >90)
GLUCOSE: 197 mg/dL — AB (ref 70–99)
POTASSIUM: 3.3 meq/L — AB (ref 3.7–5.3)
SODIUM: 134 meq/L — AB (ref 137–147)

## 2014-04-13 LAB — CBC
HCT: 42.2 % (ref 36.0–46.0)
HEMOGLOBIN: 13.4 g/dL (ref 12.0–15.0)
MCH: 29.8 pg (ref 26.0–34.0)
MCHC: 31.8 g/dL (ref 30.0–36.0)
MCV: 93.8 fL (ref 78.0–100.0)
Platelets: 308 10*3/uL (ref 150–400)
RBC: 4.5 MIL/uL (ref 3.87–5.11)
RDW: 14.7 % (ref 11.5–15.5)
WBC: 3.7 10*3/uL — AB (ref 4.0–10.5)

## 2014-04-13 MED ORDER — FUROSEMIDE 20 MG PO TABS: 20.0000 mg | ORAL_TABLET | Freq: Two times a day (BID) | ORAL | Status: DC

## 2014-04-13 MED ORDER — METOPROLOL SUCCINATE ER 100 MG PO TB24: 100.0000 mg | ORAL_TABLET | Freq: Every day | ORAL | Status: DC

## 2014-04-13 MED ORDER — RIVAROXABAN 15 MG PO TABS: 15.0000 mg | ORAL_TABLET | Freq: Every day | ORAL | Status: DC

## 2014-04-13 MED ORDER — RIVAROXABAN 15 MG PO TABS
15.0000 mg | ORAL_TABLET | Freq: Every day | ORAL | Status: DC
  Administered 2014-04-13: 15 mg via ORAL
  Filled 2014-04-13: qty 1

## 2014-04-13 MED ORDER — POTASSIUM CHLORIDE CRYS ER 20 MEQ PO TBCR
40.0000 meq | EXTENDED_RELEASE_TABLET | Freq: Once | ORAL | Status: AC
  Administered 2014-04-13: 40 meq via ORAL
  Filled 2014-04-13: qty 2

## 2014-04-13 NOTE — Progress Notes (Signed)
Subjective:  Patient was seen and examined this morning. Patient seems much improved from yesterday. She reports normal energy, no longer fatigued. She states her shortness of breath is improved. She denies feeling her heart race. Patient denies any headache, dizziness, fatigue, chest pain, shortness of breath, nausea or abdominal pain.   Objective: Vital signs in last 24 hours: Filed Vitals:   04/12/14 2055 04/12/14 2131 04/13/14 0404 04/13/14 1102  BP: 95/63  101/57 108/59  Pulse: 75  78 95  Temp: 98.7 F (37.1 C)  98 F (36.7 C)   TempSrc: Oral  Oral   Resp: 18  18   Height:      Weight:   66.3 kg (146 lb 2.6 oz)   SpO2: 97% 95% 100%    Weight change: 2.2 kg (4 lb 13.6 oz)  Intake/Output Summary (Last 24 hours) at 04/13/14 1114 Last data filed at 04/13/14 0749  Gross per 24 hour  Intake    902 ml  Output    900 ml  Net      2 ml    General: Vital signs reviewed. Patient is an elderly female in no acute distress and cooperative with exam. Patient appears back at baseline. She is talkative and watching tv like normal. She was able to transfer from bed to bedside commode with minimal assistance. Cardiovascular: Irregularly irregular, regular rate, 2/6 blowing holosystolic murmur heard best at the apex.  Pulmonary/Chest: Clear to auscultation bilaterally, decreased breath sounds, no wheezing, rhonchi or crackles. No accessory muscle use. Abdominal: Soft, non-tender, non-distended, BS +, no suprapubic tenderness. Musculoskeletal: No joint deformities, erythema, or stiffness, ROM full and nontender.  Extremities: 2+ pitting edema in her left lower extremity extending up to ankle, trace-1+ pitting edema in her right lower extremity extending to her ankle, no calf tenderness, pulses symmetric and intact bilaterally.  Neurological: AA&O x3, no focal motor deficit, sensory intact to light touch bilaterally.  Skin: Warm, dry and intact. No rashes or erythema. Psychiatric: Normal mood  and affect. speech and behavior is normal. Cognition and memory are normal.   Lab Results: Basic Metabolic Panel:  Recent Labs Lab 04/07/14 1003 04/07/14 1015  04/11/14 1244 04/12/14 0826  NA 136*  --   < > 139 135*  K 3.5*  --   < > 3.5* 3.6*  CL 96  --   < > 91* 88*  CO2 27  --   < > 38* 31  GLUCOSE 272*  --   < > 120* 131*  BUN 17  --   < > 24* 24*  CREATININE 0.70  --   < > 0.71 0.67  CALCIUM 9.1  --   < > 9.0 8.7  MG  --  1.7  --   --   --   PHOS 2.9  --   --   --   --   < > = values in this interval not displayed.  CBC:  Recent Labs Lab 04/11/14 1244 04/12/14 0826  WBC 4.1 4.1  NEUTROABS  --  1.8  HGB 13.7 15.0  HCT 42.0 46.6*  MCV 93.5 92.1  PLT 320 277   CBG:  Recent Labs Lab 04/12/14 0604 04/12/14 1057 04/12/14 1635 04/12/14 2059 04/13/14 0544 04/13/14 0610  GLUCAP 128* 161* 151* 143* 72 74   Medications: I have reviewed the patient's current medications.  Prescriptions prior to admission  Medication Sig Dispense Refill  . albuterol (PROVENTIL HFA;VENTOLIN HFA) 108 (90 BASE) MCG/ACT inhaler Inhale  1-2 puffs into the lungs every 6 (six) hours as needed for shortness of breath.  18 g  11  . brimonidine (ALPHAGAN P) 0.1 % SOLN Place 1 drop into the left eye every 12 (twelve) hours.  15 mL  1  . dorzolamide (TRUSOPT) 2 % ophthalmic solution Place 1 drop into the left eye 2 (two) times daily.       . Fluticasone-Salmeterol (ADVAIR) 250-50 MCG/DOSE AEPB Inhale 1 puff into the lungs 2 (two) times daily.  60 each  11  . furosemide (LASIX) 40 MG tablet Take 1 tablet (40 mg total) by mouth 2 (two) times daily.  60 tablet  2  . lisinopril (PRINIVIL,ZESTRIL) 20 MG tablet Take 1 tablet (20 mg total) by mouth daily.  30 tablet  3  . metFORMIN (GLUCOPHAGE) 1000 MG tablet Take 1 tablet (1,000 mg total) by mouth 2 (two) times daily with a meal.  60 tablet  3  . metoprolol succinate (TOPROL-XL) 50 MG 24 hr tablet Take 1 tablet (50 mg total) by mouth daily. Take with  or immediately following a meal.  30 tablet  3  . potassium chloride SA (K-DUR,KLOR-CON) 20 MEQ tablet Take 2 tablets (40 mEq total) by mouth daily.  60 tablet  2  . rivaroxaban (XARELTO) 20 MG TABS tablet Take 1 tablet (20 mg total) by mouth every morning.  30 tablet  3    Scheduled Meds: . antiseptic oral rinse  7 mL Mouth Rinse BID  . brimonidine  1 drop Left Eye BID  . dorzolamide  1 drop Left Eye BID  . feeding supplement (GLUCERNA SHAKE)  237 mL Oral Q24H  . insulin aspart  0-9 Units Subcutaneous TID WC  . insulin glargine  15 Units Subcutaneous QHS  . lisinopril  10 mg Oral Daily  . metoprolol succinate  100 mg Oral Daily  . mometasone-formoterol  2 puff Inhalation BID  . multivitamin with minerals  1 tablet Oral Daily  . rivaroxaban  20 mg Oral Q supper  . sodium chloride  3 mL Intravenous Q12H   Continuous Infusions:  PRN Meds:.acetaminophen, acetaminophen, benzonatate, ipratropium-albuterol  Assessment/Plan: Acute on Chronic Combined Systolic and Diastolic CHF exacerbation: CHF exacerbation has improved. Patient was relatively hypotensive yesterday 92/57-120/64, which is lower than her baseline. Low blood pressure is likely secondary to diuresis. Patient was very fatigued and weak yesterday which we attributed to low blood pressure and addition of spironolactone and increase in metoprolol. We held metolazone, spironolactone, and lasix. We decreased lisinopril to 10 mg daily. We will consider starting spironolactone in an outpatient setting. Her I/O's showed a net output of 600 yesterday, but a net total of +422. Weight is 66.3 kg today (146lbs). Patient has been breathing 97-100% on 2L O2 [place]. Afebrile, normal RR 18. Patient was back at baseline this morning, less fatigued and more alert and talkative. Her blood pressure this morning was 101/57. Patient has been set up with home health and will receive HH RN, oxygen and PT. She lives at home with daughter and granddaughter. Patient  will follow up with Dr. Andrey Campanile, Internal Medicine on 04/19/14 and with Corine Shelter, Cardiology, on 05/02/14.  -Continue metoprolol XR 100 mg daily  -Duoneb (ipratropium-albuterol) Q4H prn wheezing, shortness of breath  -Dulera (mometasone-formoterol) 2 puffs BID  -Cardiac monitoring  -Daily weights  -Vital signs Q4H -Cardiac Diet  -I/Os  -Oxygen via [place] 2L  -Monitor BUN/Cr -BMET pending -Heart Failure Team following -Likely discharge to home today -Hold Lasix  but transition to 20 mg po BID on discharge -Hold lisinopril on discharge with plan to follow up on Monday in IM clinic  Permanent Atrial Fibrillation: Patient is on metoprolol 50 mg daily and Xarelto 20 mg daily at home. We increased her metoprolol to 100 mg XR which decreased her HR to 70s-100s. Heart rate controlled on 100 mg daily. -Continue metoprolol XR 100 mg  -Xarelto 20 mg daily with dinner -Cardiac monitoring  DM type II: Patient is on metformin 1 gram BID at home. Most recent HgbA1C was 6.7 on 11/24/13. Most recent HgbA1c 7.9 on 04/05/14. CBGs have been more controlled. Glucose ranged 72-161 yesterday. -Lantus 15 Units QHS -SSI-sensitive  -CBG checks 4 times daily  -Lactose free-carb modified diet  Hypokalemia: Patient's potassium is still pending. Patient has chronically low-normal potassium, baseline ~3.6, most likely contributed to by Lasix at home. Magnesium and phosphorus are normal. We will continue the patient on home dose of KCl 40 mEq daily. This may not be necessary in the future if the patient is started on spironolactone in outpatient clinic. -BMET pending  DVT/PE ppx: Xarelto 20 mg daily  Dispo: Disposition is deferred at this time, awaiting improvement of current medical problems.  Anticipated discharge in approximately 1-2 day(s).   The patient does have a current PCP Yolanda Manges, DO) and does need an [facility] hospital follow-up appointment after discharge.  The patient does not have transportation  limitations that hinder transportation to clinic appointments.  .Services Needed at time of discharge: Y = Yes, Blank = No PT:   OT:   RN:   Equipment:   Other:     LOS: 8 days   Jill Alexanders, DO PGY-1 Internal Medicine Resident Pager # [phone] 04/13/2014 11:14 AM

## 2014-04-13 NOTE — Progress Notes (Signed)
  PROGRESS NOTE MEDICINE TEACHING ATTENDING   Day 8 of stay Patient name: [name]   Medical record number: [id number] Date of birth: [birth year]   Doing well today. No shortness of breath. Alert. Reports less fatigue. No dizziness Lungs clear, moving good air. Irregular rhythm, no tachycardia or bradycardia. Pedal edema improved. Blood pressure improved. With adequate home health care, and early follow up - likely coming Monday, perhaps we can discharge her today. Would consult cardiology about their opinion for this.  I have discussed the care of this patient with my IM team residents. Please see the resident note for details.  Marquarius Lofton 04/13/2014, 12:09 PM.

## 2014-04-13 NOTE — Progress Notes (Signed)
Subjective: [name] is an 80 YO woman with a PMH of asthma, CHF, A fib, DM2, and HLD on HD8 who initially presented with SOB, wheezing, and two brief episodes of chest pain. Overnight, the patient reports that she slept well and that her wheezing and chest pain have completely resolved. She states that she is feeling less weak than yesterday, and is potentially ready for discharge today. She denies any chest pain, fever, nausea, or vomiting. She is tolerating a normal diet and has a good appetite. She has had no additional episodes of diarrhea.  Objective: Vital signs in last 24 hours: Filed Vitals:   04/12/14 1500 04/12/14 2055 04/12/14 2131 04/13/14 0404  BP: 105/56 95/63  101/57  Pulse: 82 75  78  Temp: 97.4 F (36.3 C) 98.7 F (37.1 C)  98 F (36.7 C)  TempSrc: Oral Oral  Oral  Resp: 24 18  18   Height:      Weight:    66.3 kg (146 lb 2.6 oz)  SpO2: 100% 97% 95% 100%   Weight change: 2.2 kg (4 lb 13.6 oz)  Intake/Output Summary (Last 24 hours) at 04/13/14 0833 Last data filed at 04/13/14 0749  Gross per 24 hour  Intake    782 ml  Output    900 ml  Net   -118 ml   BP 101/57  Pulse 78  Temp(Src) 98 F (36.7 C) (Oral)  Resp 18  Ht 5\' 5"  (1.651 m)  Wt 66.3 kg (146 lb 2.6 oz)  BMI 24.32 kg/m2  SpO2 100% General appearance: alert, cooperative and no distress Head: Normocephalic, without obvious abnormality, atraumatic Lungs: clear to auscultation bilaterally Heart: irregularly irregular rhythm and regular rate Abdomen: soft, non-tender; bowel sounds normal; no masses,  no organomegaly Extremities: 1+ edema in L leg, trace edema in the R leg Lab Results: Basic Metabolic Panel:  Recent Labs Lab 04/07/14 1003 04/07/14 1015  04/11/14 1244 04/12/14 0826  NA 136*  --   < > 139 135*  K 3.5*  --   < > 3.5* 3.6*  CL 96  --   < > 91* 88*  CO2 27  --   < > 38* 31  GLUCOSE 272*  --   < > 120* 131*  BUN 17  --   < > 24* 24*  CREATININE 0.70  --   < > 0.71 0.67    CALCIUM 9.1  --   < > 9.0 8.7  MG  --  1.7  --   --   --   PHOS 2.9  --   --   --   --   < > = values in this interval not displayed.  CBC:  Recent Labs Lab 04/11/14 1244 04/12/14 0826  WBC 4.1 4.1  NEUTROABS  --  1.8  HGB 13.7 15.0  HCT 42.0 46.6*  MCV 93.5 92.1  PLT 320 277   CBG:  Recent Labs Lab 04/12/14 0604 04/12/14 1057 04/12/14 1635 04/12/14 2059 04/13/14 0544 04/13/14 0610  GLUCAP 128* 161* 151* 143* 72 74    Micro Results: Recent Results (from the past 240 hour(s))  CLOSTRIDIUM DIFFICILE BY PCR     Status: None   Collection Time    04/11/14  5:26 PM      Result Value Ref Range Status   C difficile by pcr NEGATIVE  NEGATIVE Final   Studies/Results: No results found. Medications: I have reviewed the patient's current medications. Scheduled Meds: . antiseptic oral rinse  7 mL Mouth Rinse BID  . brimonidine  1 drop Left Eye BID  . dorzolamide  1 drop Left Eye BID  . feeding supplement (GLUCERNA SHAKE)  237 mL Oral Q24H  . insulin aspart  0-9 Units Subcutaneous TID WC  . insulin glargine  15 Units Subcutaneous QHS  . lisinopril  10 mg Oral Daily  . metoprolol succinate  100 mg Oral Daily  . mometasone-formoterol  2 puff Inhalation BID  . multivitamin with minerals  1 tablet Oral Daily  . rivaroxaban  20 mg Oral Q supper  . sodium chloride  3 mL Intravenous Q12H   Continuous Infusions:  PRN Meds:.acetaminophen, acetaminophen, benzonatate, ipratropium-albuterol Assessment/Plan: Principal Problem:   Asthma with acute exacerbation in adult Active Problems:   DIABETES MELLITUS, TYPE II   HYPERTENSION   Acute on chronic combined systolic and diastolic congestive heart failure   Atrial fibrillation   Valvular heart disease   Current use of long term anticoagulation--xarelto  Mild asthma exacerbation with concomitant combined CHF exacerbation:  Patient presented with worsening dyspnea, productive cough and hemoptysis 8 days ago. Repeat CXR showed  CHF with mild interstitial edema and possible pericardial effusion. Oxygen saturation overnight has ranged from 95-100% on 2L oyxgen via [place]. Net I/Os from 8/26 was +422. BUN/Cr today is 22/0.67. Most recent hospital weight is 66.3 kg, down about 3 kg from her pre-hospital weight. BP has been borderline hypotensive throughout the night. Spironolactone and lasix held yesterday, lisinopril was reduced to 10 mg, and metalozone was discontinued yesterday. Patient's BP will be further assessed before discharge. Today, patient was not in distress and had no acute complaints. No crackles or wheezes auscultated. Metoprolol dose maintained at 100 mg and HR ranged from 78-82 overnight. Outpatient cardiology appointment made to follow up on HR and echo findings. Spironolactone will be initiated as outpatient with PCP. K+ was 3.3 today, will monitor. Case manager has home O2 ready upon discharge. PT and Home Health will also be assisting the patient after discharge. -Continue lisinopril 10 mg daily  -Continue metoprolol XR 100 mg daily  -Duoneb (ipratropium-albuterol) Q4H prn wheezing, shortness of breath  -Dulera (mometasone-formoterol) 2 puffs BID  -Cardiac monitoring  -Daily weights  -Cardiac Diet  -I/Os  -Oxygen via [place] 2L  -Heart Failure Team following  -Echocardiogram results returned - will pursue further workup as an outpatient if needed.  -Home O2 in place per Case Manager  Permanent Atrial Fibrillation: Metoprolol 100 mg qd started 3 days ago. HR overnight ranged from 78-82. Continue at current dose.  -Xarelto 20 mg daily with dinner  -Cardiac monitoring   DM type II: Patient is on metformin 1 gram BID at home. Most recent HgbA1C was 6.7 on 11/24/13. Glucose was 74 this morning.  -Lantus 15 Units QHS  -SSI-sensitive  -CBG checks 4 times daily  -Lactose-free cardiac diet   Hypokalemia: Patient's potassium was 3.3 today. Patient has chronically low-normal potassium, baseline ~3.6, most likely  contributed to by Lasix at home. Magnesium and phosphorus are normal.  -Will monitor K+ level since spironolactone was held yesterday. -Home supplementation with KCl 20 mg will be started at discharge  DVT/PE ppx: Xarelto 20 mg daily -  Patient was on 20 mg Xarelto prior to her PE in Jan 2015, however, she likely missed a few doses prior to PE. PE was believed to be provoked due to leg lesion and immobility. Pharmacy was consulted regarding the appropriate dose upon discharge, taking into account her decreased creatinine clearance. Pharmacist  recommended that 15 mg Xarelto be initiated at discharge.  Dispo: Plan to discharge today  The patient does have a current PCP Yolanda Manges, DO) and does need an [facility] hospital follow-up appointment after discharge.   The patient does not have transportation limitations that hinder transportation to clinic appointments.  This is a Psychologist, occupational Note.  The care of the patient was discussed with Dr. Senaida Ores and the assessment and plan formulated with their assistance.  Please see their attached note for official documentation of the daily encounter.   LOS: 8 days   Alford Highland, Med Student 04/13/2014, 8:33 AM

## 2014-04-13 NOTE — Progress Notes (Signed)
Physical Therapy Treatment Patient Details Name: [name] MRN: [id number] DOB: 1925/11/08 Today's Date: 04/13/2014    History of Present Illness Adm 04/05/14 with dyspnea found to be due to both asthma and CHF exacerbation. PMHx- CHF with EF 40%, asthma, Graves disease, afib, DM, osteopenia, vasculitis, Rt hip hemiarthroplasty     PT Comments    **Pt required increased assistance today and tolerated less activity overall. She reports pain in L knee in standing, and in left calf with palpation (RN notified). Mod to Max assist for stand pivot transfers today. May need ST-SNF if family unable to provide this higher level of care.  *  Follow Up Recommendations  Home health PT;Supervision/Assistance - 24 hour;SNF (decreased mobiilty today, may need ST-SNF if family unable to provide higher level of assistance)     Equipment Recommendations  None recommended by PT    Recommendations for Other Services       Precautions / Restrictions Precautions Precautions: Fall Restrictions Weight Bearing Restrictions: No    Mobility  Bed Mobility Overal bed mobility: Needs Assistance Bed Mobility: Sit to Supine       Sit to supine: Mod assist   General bed mobility comments: mod A to bring BLEs into bed  Transfers Overall transfer level: Needs assistance Equipment used: Rolling walker (2 wheeled) Transfers: Sit to/from [employer] Sit to Stand: Max assist Stand pivot transfers: Mod assist       General transfer comment: assisted pt from recliner to [facility], then BSC to bed using RW. Pt required Max assist to rise, verbal cues for setup, difficulty WB thru LLE in standing with RW. Increased time.   Ambulation/Gait     Assistive device: Rolling walker (2 wheeled)       General Gait Details: unable due to pain in LLE, difficulty advancing RLE with RW during SPT   Stairs            Wheelchair Mobility    Modified Rankin (Stroke Patients Only)        Balance     Sitting balance-Leahy Scale: Fair       Standing balance-Leahy Scale: Poor                      Cognition Arousal/Alertness: Awake/alert Behavior During Therapy: WFL for tasks assessed/performed Overall Cognitive Status: Within Functional Limits for tasks assessed                      Exercises      General Comments        Pertinent Vitals/Pain Pain Assessment: 0-10 Pain Score: 4  Pain Location: LLE (calf tender to touch in sitting, knee painful with weightbearing) Pain Intervention(s): Limited activity within patient's tolerance;Monitored during session;Other (comment) (RN notified)    Home Living                      Prior Function            PT Goals (current goals can now be found in the care plan section) Acute Rehab PT Goals Patient Stated Goal: return home when she is stronger PT Goal Formulation: With patient Time For Goal Achievement: 04/14/14 Potential to Achieve Goals: Good Progress towards PT goals: Not progressing toward goals - comment    Frequency  Min 3X/week    PT Plan Current plan remains appropriate    Co-evaluation             End  of Session Equipment Utilized During Treatment: Gait belt;Oxygen Activity Tolerance: Patient limited by fatigue;Patient limited by pain Patient left: in bed;with call bell/phone within reach     Time: 1351-1426 PT Time Calculation (min): 35 min  Charges:  $Therapeutic Activity: 23-37 mins                    G Codes:      Tamala Ser 04/13/2014, 2:36 PM [phone]

## 2014-04-13 NOTE — Progress Notes (Addendum)
    SUBJECTIVE:  No SOB.  No distress.  Less somnolent.    PHYSICAL EXAM Filed Vitals:   04/12/14 2055 04/12/14 2131 04/13/14 0404 04/13/14 1102  BP: 95/63  101/57 108/59  Pulse: 75  78 95  Temp: 98.7 F (37.1 C)  98 F (36.7 C)   TempSrc: Oral  Oral   Resp: 18  18   Height:      Weight:   146 lb 2.6 oz (66.3 kg)   SpO2: 97% 95% 100%    General:  No acute distress Lungs:  Few scattered crackles Heart:  Irregular Abdomen:  Positive bowel sounds, no rebound no guarding Extremities:  No edema   LABS:  Results for orders placed during the hospital encounter of 04/05/14 (from the past 24 hour(s))  GLUCOSE, CAPILLARY     Status: Abnormal   Collection Time    04/12/14  4:35 PM      Result Value Ref Range   Glucose-Capillary 151 (*) 70 - 99 mg/dL   Comment 1 Notify RN    GLUCOSE, CAPILLARY     Status: Abnormal   Collection Time    04/12/14  8:59 PM      Result Value Ref Range   Glucose-Capillary 143 (*) 70 - 99 mg/dL   Comment 1 Documented in Chart     Comment 2 Notify RN    GLUCOSE, CAPILLARY     Status: None   Collection Time    04/13/14  5:44 AM      Result Value Ref Range   Glucose-Capillary 72  70 - 99 mg/dL  GLUCOSE, CAPILLARY     Status: None   Collection Time    04/13/14  6:10 AM      Result Value Ref Range   Glucose-Capillary 74  70 - 99 mg/dL   Comment 1 Documented in Chart     Comment 2 Notify RN    CBC     Status: Abnormal   Collection Time    04/13/14 11:00 AM      Result Value Ref Range   WBC 3.7 (*) 4.0 - 10.5 K/uL   RBC 4.50  3.87 - 5.11 MIL/uL   Hemoglobin 13.4  12.0 - 15.0 g/dL   HCT 09.4  70.9 - 62.8 %   MCV 93.8  78.0 - 100.0 fL   MCH 29.8  26.0 - 34.0 pg   MCHC 31.8  30.0 - 36.0 g/dL   RDW 36.6  29.4 - 76.5 %   Platelets 308  150 - 400 K/uL    Intake/Output Summary (Last 24 hours) at 04/13/14 1141 Last data filed at 04/13/14 1100  Gross per 24 hour  Intake    902 ml  Output    901 ml  Net      1 ml    ASSESSMENT AND PLAN:      ATRIAL FIB: Continue current therapy.   Continue Xarelto.    ACUTE ON CHRONIC DIASTOLIC HF:    BP is better.  would continue hold the diuretic.   It however would need to be restarted prior to discharge.   Note no BMET today.  Check again in AM.   Rollene Rotunda 04/13/2014 11:41 AM

## 2014-04-13 NOTE — Discharge Instructions (Signed)
Thank you for allowing [place] to be involved in your healthcare while you were hospitalized at [facility].   Please note that there have been changes to your home medications.  --> PLEASE LOOK AT YOUR DISCHARGE MEDICATION LIST FOR DETAILS  Please call your PCP if you have any questions or concerns, or any difficulty getting any of your medications.  Please return to the ER if you have worsening of your symptoms or new severe symptoms arise.   Atrial Fibrillation Atrial fibrillation is a type of irregular heart rhythm (arrhythmia). During atrial fibrillation, the upper chambers of the heart (atria) quiver continuously in a chaotic pattern. This causes an irregular and often rapid heart rate.  Atrial fibrillation is the result of the heart becoming overloaded with disorganized signals that tell it to beat. These signals are normally released one at a time by a part of the right atrium called the sinoatrial node. They then travel from the atria to the lower chambers of the heart (ventricles), causing the atria and ventricles to contract and pump blood as they pass. In atrial fibrillation, parts of the atria outside of the sinoatrial node also release these signals. This results in two problems. First, the atria receive so many signals that they do not have time to fully contract. Second, the ventricles, which can only receive one signal at a time, beat irregularly and out of rhythm with the atria.  There are three types of atrial fibrillation:   Paroxysmal. Paroxysmal atrial fibrillation starts suddenly and stops on its own within a week.  Persistent. Persistent atrial fibrillation lasts for more than a week. It may stop on its own or with treatment.  Permanent. Permanent atrial fibrillation does not go away. Episodes of atrial fibrillation may lead to permanent atrial fibrillation. Atrial fibrillation can prevent your heart from pumping blood normally. It increases your risk of  stroke and can lead to heart failure.  CAUSES   Heart conditions, including a heart attack, heart failure, coronary artery disease, and heart valve conditions.   Inflammation of the sac that surrounds the heart (pericarditis).  Blockage of an artery in the lungs (pulmonary embolism).  Pneumonia or other infections.  Chronic lung disease.  Thyroid problems, especially if the thyroid is overactive (hyperthyroidism).  Caffeine, excessive alcohol use, and use of some illegal drugs.   Use of some medicines, including certain decongestants and diet pills.  Heart surgery.   Birth defects.  Sometimes, no cause can be found. When this happens, the atrial fibrillation is called lone atrial fibrillation. The risk of complications from atrial fibrillation increases if you have lone atrial fibrillation and you are age 80 years or older. RISK FACTORS  Heart failure.  Coronary artery disease.  Diabetes mellitus.   High blood pressure (hypertension).   Obesity.   Other arrhythmias.   Increased age. SIGNS AND SYMPTOMS   A feeling that your heart is beating rapidly or irregularly.   A feeling of discomfort or pain in your chest.   Shortness of breath.   Sudden light-headedness or weakness.   Getting tired easily when exercising.   Urinating more often than normal (mainly when atrial fibrillation first begins).  In paroxysmal atrial fibrillation, symptoms may start and suddenly stop. DIAGNOSIS  Your health care provider may be able to detect atrial fibrillation when taking your pulse. Your health care provider may have you take a test called an ambulatory electrocardiogram (ECG). An ECG records your heartbeat patterns over a 24-hour period.  You may also have other tests, such as:  Transthoracic echocardiogram (TTE). During echocardiography, sound waves are used to evaluate how blood flows through your heart.  Transesophageal echocardiogram (TEE).  Stress test.  There is more than one type of stress test. If a stress test is needed, ask your health care provider about which type is best for you.  Chest X-ray exam.  Blood tests.  Computed tomography (CT). TREATMENT  Treatment may include:  Treating any underlying conditions. For example, if you have an overactive thyroid, treating the condition may correct atrial fibrillation.  Taking medicine. Medicines may be given to control a rapid heart rate or to prevent blood clots, heart failure, or a stroke.  Having a procedure to correct the rhythm of the heart:  Electrical cardioversion. During electrical cardioversion, a controlled, low-energy shock is delivered to the heart through your skin. If you have chest pain, very low blood pressure, or sudden heart failure, this procedure may need to be done as an emergency.  Catheter ablation. During this procedure, heart tissues that send the signals that cause atrial fibrillation are destroyed.  Surgical ablation. During this surgery, thin lines of heart tissue that carry the abnormal signals are destroyed. This procedure can either be an open-heart surgery or a minimally invasive surgery. With the minimally invasive surgery, small cuts are made to access the heart instead of a large opening.  Pulmonary venous isolation. During this surgery, tissue around the veins that carry blood from the lungs (pulmonary veins) is destroyed. This tissue is thought to carry the abnormal signals. HOME CARE INSTRUCTIONS   Take medicines only as directed by your health care provider. Some medicines can make atrial fibrillation worse or recur.  If blood thinners were prescribed by your health care provider, take them exactly as directed. Too much blood-thinning medicine can cause bleeding. If you take too little, you will not have the needed protection against stroke and other problems.  Perform blood tests at home if directed by your health care provider. Perform blood  tests exactly as directed.  Quit smoking if you smoke.  Do not drink alcohol.  Do not drink caffeinated beverages such as coffee, soda, and some teas. You may drink decaffeinated coffee, soda, or tea.   Maintain a healthy weight.Do not use diet pills unless your health care provider approves. They may make heart problems worse.   Follow diet instructions as directed by your health care provider.  Exercise regularly as directed by your health care provider.  Keep all follow-up visits as directed by your health care provider. This is important. PREVENTION  The following substances can cause atrial fibrillation to recur:   Caffeinated beverages.  Alcohol.  Certain medicines, especially those used for breathing problems.  Certain herbs and herbal medicines, such as those containing ephedra or ginseng.  Illegal drugs, such as cocaine and amphetamines. Sometimes medicines are given to prevent atrial fibrillation from recurring. Proper treatment of any underlying condition is also important in helping prevent recurrence.  SEEK MEDICAL CARE IF:  You notice a change in the rate, rhythm, or strength of your heartbeat.  You suddenly begin urinating more frequently.  You tire more easily when exerting yourself or exercising. SEEK IMMEDIATE MEDICAL CARE IF:   You have chest pain, abdominal pain, sweating, or weakness.  You feel nauseous.  You have shortness of breath.  You suddenly have swollen feet and ankles.  You feel dizzy.  Your face or limbs feel numb or weak.  You have  a change in your vision or speech. MAKE SURE YOU:   Understand these instructions.  Will watch your condition.  Will get help right away if you are not doing well or get worse. Document Released: 08/04/2005 Document Revised: 12/19/2013 Document Reviewed: 09/14/2012 [facility] Patient Information 2015 [place], [place]. This information is not intended to replace advice given to you by your health care  provider. Make sure you discuss any questions you have with your health care provider.  Heart Failure Heart failure is a condition in which the heart has trouble pumping blood. This means your heart does not pump blood efficiently for your body to work well. In some cases of heart failure, fluid may back up into your lungs or you may have swelling (edema) in your lower legs. Heart failure is usually a long-term (chronic) condition. It is important for you to take good care of yourself and follow your health care provider's treatment plan. CAUSES  Some health conditions can cause heart failure. Those health conditions include:  High blood pressure (hypertension). Hypertension causes the heart muscle to work harder than normal. When pressure in the blood vessels is high, the heart needs to pump (contract) with more force in order to circulate blood throughout the body. High blood pressure eventually causes the heart to become stiff and weak.  Coronary artery disease (CAD). CAD is the buildup of cholesterol and fat (plaque) in the arteries of the heart. The blockage in the arteries deprives the heart muscle of oxygen and blood. This can cause chest pain and may lead to a heart attack. High blood pressure can also contribute to CAD.  Heart attack (myocardial infarction). A heart attack occurs when one or more arteries in the heart become blocked. The loss of oxygen damages the muscle tissue of the heart. When this happens, part of the heart muscle dies. The injured tissue does not contract as well and weakens the heart's ability to pump blood.  Abnormal heart valves. When the heart valves do not open and close properly, it can cause heart failure. This makes the heart muscle pump harder to keep the blood flowing.  Heart muscle disease (cardiomyopathy or myocarditis). Heart muscle disease is damage to the heart muscle from a variety of causes. These can include drug or alcohol abuse, infections, or unknown  reasons. These can increase the risk of heart failure.  Lung disease. Lung disease makes the heart work harder because the lungs do not work properly. This can cause a strain on the heart, leading it to fail.  Diabetes. Diabetes increases the risk of heart failure. High blood sugar contributes to high fat (lipid) levels in the blood. Diabetes can also cause slow damage to tiny blood vessels that carry important nutrients to the heart muscle. When the heart does not get enough oxygen and food, it can cause the heart to become weak and stiff. This leads to a heart that does not contract efficiently.  Other conditions can contribute to heart failure. These include abnormal heart rhythms, thyroid problems, and low blood counts (anemia). Certain unhealthy behaviors can increase the risk of heart failure, including:  Being overweight.  Smoking or chewing tobacco.  Eating foods high in fat and cholesterol.  Abusing illicit drugs or alcohol.  Lacking physical activity. SYMPTOMS  Heart failure symptoms may vary and can be hard to detect. Symptoms may include:  Shortness of breath with activity, such as climbing stairs.  Persistent cough.  Swelling of the feet, ankles, legs, or abdomen.  Unexplained weight gain.  Difficulty breathing when lying flat (orthopnea).  Waking from sleep because of the need to sit up and get more air.  Rapid heartbeat.  Fatigue and loss of energy.  Feeling light-headed, dizzy, or close to fainting.  Loss of appetite.  Nausea.  Increased urination during the night (nocturia). DIAGNOSIS  A diagnosis of heart failure is based on your history, symptoms, physical examination, and diagnostic tests. Diagnostic tests for heart failure may include:  Echocardiography.  Electrocardiography.  Chest X-ray.  Blood tests.  Exercise stress test.  Cardiac angiography.  Radionuclide scans. TREATMENT  Treatment is aimed at managing the symptoms of heart  failure. Medicines, behavioral changes, or surgical intervention may be necessary to treat heart failure.  Medicines to help treat heart failure may include:  Angiotensin-converting enzyme (ACE) inhibitors. This type of medicine blocks the effects of a blood protein called angiotensin-converting enzyme. ACE inhibitors relax (dilate) the blood vessels and help lower blood pressure.  Angiotensin receptor blockers (ARBs). This type of medicine blocks the actions of a blood protein called angiotensin. Angiotensin receptor blockers dilate the blood vessels and help lower blood pressure.  Water pills (diuretics). Diuretics cause the kidneys to remove salt and water from the blood. The extra fluid is removed through urination. This loss of extra fluid lowers the volume of blood the heart pumps.  Beta blockers. These prevent the heart from beating too fast and improve heart muscle strength.  Digitalis. This increases the force of the heartbeat.  Healthy behavior changes include:  Obtaining and maintaining a healthy weight.  Stopping smoking or chewing tobacco.  Eating heart-healthy foods.  Limiting or avoiding alcohol.  Stopping illicit drug use.  Physical activity as directed by your health care provider.  Surgical treatment for heart failure may include:  A procedure to open blocked arteries, repair damaged heart valves, or remove damaged heart muscle tissue.  A pacemaker to improve heart muscle function and control certain abnormal heart rhythms.  An internal cardioverter defibrillator to treat certain serious abnormal heart rhythms.  A left ventricular assist device (LVAD) to assist the pumping ability of the heart. HOME CARE INSTRUCTIONS   Take medicines only as directed by your health care provider. Medicines are important in reducing the workload of your heart, slowing the progression of heart failure, and improving your symptoms.  Do not stop taking your medicine unless  directed by your health care provider.  Do not skip any dose of medicine.  Refill your prescriptions before you run out of medicine. Your medicines are needed every day.  Engage in moderate physical activity if directed by your health care provider. Moderate physical activity can benefit some people. The elderly and people with severe heart failure should consult with a health care provider for physical activity recommendations.  Eat heart-healthy foods. Food choices should be free of trans fat and low in saturated fat, cholesterol, and salt (sodium). Healthy choices include fresh or frozen fruits and vegetables, fish, lean meats, legumes, fat-free or low-fat dairy products, and whole grain or high fiber foods. Talk to a dietitian to learn more about heart-healthy foods.  Limit sodium if directed by your health care provider. Sodium restriction may reduce symptoms of heart failure in some people. Talk to a dietitian to learn more about heart-healthy seasonings.  Use healthy cooking methods. Healthy cooking methods include roasting, grilling, broiling, baking, poaching, steaming, or stir-frying. Talk to a dietitian to learn more about healthy cooking methods.  Limit fluids if directed by your  health care provider. Fluid restriction may reduce symptoms of heart failure in some people.  Weigh yourself every day. Daily weights are important in the early recognition of excess fluid. You should weigh yourself every morning after you urinate and before you eat breakfast. Wear the same amount of clothing each time you weigh yourself. Record your daily weight. Provide your health care provider with your weight record.  Monitor and record your blood pressure if directed by your health care provider.  Check your pulse if directed by your health care provider.  Lose weight if directed by your health care provider. Weight loss may reduce symptoms of heart failure in some people.  Stop smoking or chewing  tobacco. Nicotine makes your heart work harder by causing your blood vessels to constrict. Do not use nicotine gum or patches before talking to your health care provider.  Keep all follow-up visits as directed by your health care provider. This is important.  Limit alcohol intake to no more than 1 drink per day for nonpregnant women and 2 drinks per day for men. One drink equals 12 ounces of beer, 5 ounces of wine, or 1 ounces of hard liquor. Drinking more than that is harmful to your heart. Tell your health care provider if you drink alcohol several times a week. Talk with your health care provider about whether alcohol is safe for you. If your heart has already been damaged by alcohol or you have severe heart failure, drinking alcohol should be stopped completely.  Stop illicit drug use.  Stay up-to-date with immunizations. It is especially important to prevent respiratory infections through current pneumococcal and influenza immunizations.  Manage other health conditions such as hypertension, diabetes, thyroid disease, or abnormal heart rhythms as directed by your health care provider.  Learn to manage stress.  Plan rest periods when fatigued.  Learn strategies to manage high temperatures. If the weather is extremely hot:  Avoid vigorous physical activity.  Use air conditioning or fans or seek a cooler location.  Avoid caffeine and alcohol.  Wear loose-fitting, lightweight, and light-colored clothing.  Learn strategies to manage cold temperatures. If the weather is extremely cold:  Avoid vigorous physical activity.  Layer clothes.  Wear mittens or gloves, a hat, and a scarf when going outside.  Avoid alcohol.  Obtain ongoing education and support as needed.  Participate in or seek rehabilitation as needed to maintain or improve independence and quality of life. SEEK MEDICAL CARE IF:   Your weight increases by 03 lb/1.4 kg in 1 day or 05 lb/2.3 kg in a week.  You have  increasing shortness of breath that is unusual for you.  You are unable to participate in your usual physical activities.  You tire easily.  You cough more than normal, especially with physical activity.  You have any or more swelling in areas such as your hands, feet, ankles, or abdomen.  You are unable to sleep because it is hard to breathe.  You feel like your heart is beating fast (palpitations).  You become dizzy or light-headed upon standing up. SEEK IMMEDIATE MEDICAL CARE IF:   You have difficulty breathing.  There is a change in mental status such as decreased alertness or difficulty with concentration.  You have a pain or discomfort in your chest.  You have an episode of fainting (syncope). MAKE SURE YOU:   Understand these instructions.  Will watch your condition.  Will get help right away if you are not doing well or get worse.  Document Released: 08/04/2005 Document Revised: 12/19/2013 Document Reviewed: 09/03/2012 [facility] Patient Information 2015 [place], [place]. This information is not intended to replace advice given to you by your health care provider. Make sure you discuss any questions you have with your health care provider.

## 2014-04-13 NOTE — Progress Notes (Signed)
  I have seen and examined the patient, and reviewed the daily progress note by Alford Highland, MS III and discussed the care of the patient with them. Please see my progress note from 04/13/2014 for further details regarding assessment and plan.    Signed:  Jill Alexanders, DO PGY-1 Internal Medicine Resident Pager # [phone] 04/13/2014 7:37 PM

## 2014-04-13 NOTE — Progress Notes (Signed)
CSW received new order this afternoon to assist patient with SNF placement per recommendation of Physical Therapy - SNF if family unable to provide in-home assistance.  SNF order in place.  CSW spoke with patient's son [name] who stated that SNF would be acceptable to him if indicated and requested that his sister [name] be notified of this. CSW left message for [name] and began to work on short term SNF search as patient is medically stable for d/c home today per MD.  (D/C plan has been home with [facility] until this afternoon.).  CSW later received a call from patient's daughter [name] who stated adamantly that she is not going to place her mother in a SNF and she will take her home.  Daughter relates that they have had a bad experience in a nursing center prior and she will not allow her mother to experience this again.  Daughter stated she and her brother are ready to take patient home. CSW notified patient's nurse- [name] and MD has been paged.  Home Health can be arranged by [facility].  Message left for [facility] re: above.  CSW discussed above with patient's nurse [name] and update provided. She will contact after 5 pm MD number to obtain d/c order for home. Daughter denied need for an ambulance. CSW signing off.  [name]. [name], [place]  [phone]

## 2014-04-13 NOTE — Progress Notes (Signed)
FOLLOW-UP NUTRITION ASSESSMENT  INTERVENTION: Provide Glucerna Shake once daily Provide Multivitamin with minerals daily Encourage PO intake  NUTRITION DIAGNOSIS: Predicted sub optimal energy intake related to decreased appetite PTA as evidenced by pt's report and use of supplements daily PTA to maintain weight; resolved  Goal: Pt to meet >/= 90% of their estimated nutrition needs; being met  Monitor:  PO intake, weight trend, labs, I/O's  Reason for Assessment: Malnutrition Screening Tool (MST) score of 3  80 y.o. female  Admitting Dx: Acute on chronic combined systolic and diastolic congestive heart failure  ASSESSMENT: 80 yo female with PMHx of Chronic combined systolic and diastolic congestive heart failure, Asthma, Graves disease s/p thyroidectomy, HLD, permanent atrial fibrillation on metoprolol and Xarelto, and DM type II who presents to the ED with complaint of worsening shortness of breath and cough. Patient states she developed shortness of breath and a productive cough with yellow-white sputum 2 days ago. She developed audible wheezing and decided to come into the ED.   8/27: Pt asleep at time of visit with Glucerna Shake at bedside. Per nursing notes, pt is now eating 100% of most meals and continues to drink a Glucerna Shake once daily. Her weight has decreased 10 lbs in the past week. Net I/O since admission is -3.9 L.  Labs reviewed. Low sodium, low potassium  Height: Ht Readings from Last 1 Encounters:  04/05/14 5' 5"  (1.651 m)    Weight: Wt Readings from Last 1 Encounters:  04/13/14 146 lb 2.6 oz (66.3 kg)   Usual Body Weight: 155 lbs  BMI:  Body mass index is 24.32 kg/(m^2).  Estimated Nutritional Needs: Kcal: 1700-1900 Protein: 80-90 grams Fluid: 1.7-1.9 L/day  Skin: intact; +1 RLE and + 2LLE edema  Diet Order: GeneralHeart Healthy, Lactose Free    Intake/Output Summary (Last 24 hours) at 04/13/14 1416 Last data filed at 04/13/14 1345  Gross  per 24 hour  Intake   1142 ml  Output   1101 ml  Net     41 ml    Last BM: 8/26  Labs:   Recent Labs Lab 04/07/14 1003 04/07/14 1015  04/11/14 1244 04/12/14 0826 04/13/14 1040  NA 136*  --   < > 139 135* 134*  K 3.5*  --   < > 3.5* 3.6* 3.3*  CL 96  --   < > 91* 88* 88*  CO2 27  --   < > 38* 31 36*  BUN 17  --   < > 24* 24* 22  CREATININE 0.70  --   < > 0.71 0.67 0.67  CALCIUM 9.1  --   < > 9.0 8.7 8.7  MG  --  1.7  --   --   --   --   PHOS 2.9  --   --   --   --   --   GLUCOSE 272*  --   < > 120* 131* 197*  < > = values in this interval not displayed.  CBG (last 3)   Recent Labs  04/13/14 0544 04/13/14 0610 04/13/14 1232  GLUCAP 72 74 143*    Scheduled Meds: . antiseptic oral rinse  7 mL Mouth Rinse BID  . brimonidine  1 drop Left Eye BID  . dorzolamide  1 drop Left Eye BID  . feeding supplement (GLUCERNA SHAKE)  237 mL Oral Q24H  . insulin aspart  0-9 Units Subcutaneous TID WC  . insulin glargine  15 Units Subcutaneous QHS  .  lisinopril  10 mg Oral Daily  . metoprolol succinate  100 mg Oral Daily  . mometasone-formoterol  2 puff Inhalation BID  . multivitamin with minerals  1 tablet Oral Daily  . potassium chloride  40 mEq Oral Once  . rivaroxaban  15 mg Oral Q supper  . sodium chloride  3 mL Intravenous Q12H    Continuous Infusions:   Pryor Ochoa RD, LDN Inpatient Clinical Dietitian Pager: [phone] After Hours Pager: [phone]

## 2014-04-17 ENCOUNTER — Telehealth: Payer: Self-pay | Admitting: *Deleted

## 2014-04-17 ENCOUNTER — Ambulatory Visit (INDEPENDENT_AMBULATORY_CARE_PROVIDER_SITE_OTHER): Payer: PRIVATE HEALTH INSURANCE | Admitting: Internal Medicine

## 2014-04-17 ENCOUNTER — Encounter: Payer: Self-pay | Admitting: Internal Medicine

## 2014-04-17 VITALS — BP 117/76 | HR 114 | Temp 98.0°F | Ht 65.0 in | Wt 160.0 lb

## 2014-04-17 DIAGNOSIS — I5043 Acute on chronic combined systolic (congestive) and diastolic (congestive) heart failure: Secondary | ICD-10-CM

## 2014-04-17 DIAGNOSIS — E1129 Type 2 diabetes mellitus with other diabetic kidney complication: Secondary | ICD-10-CM

## 2014-04-17 DIAGNOSIS — I509 Heart failure, unspecified: Secondary | ICD-10-CM

## 2014-04-17 DIAGNOSIS — Z299 Encounter for prophylactic measures, unspecified: Secondary | ICD-10-CM

## 2014-04-17 DIAGNOSIS — E876 Hypokalemia: Secondary | ICD-10-CM

## 2014-04-17 DIAGNOSIS — E1122 Type 2 diabetes mellitus with diabetic chronic kidney disease: Secondary | ICD-10-CM

## 2014-04-17 DIAGNOSIS — I482 Chronic atrial fibrillation, unspecified: Secondary | ICD-10-CM

## 2014-04-17 DIAGNOSIS — I4891 Unspecified atrial fibrillation: Secondary | ICD-10-CM

## 2014-04-17 DIAGNOSIS — N189 Chronic kidney disease, unspecified: Secondary | ICD-10-CM

## 2014-04-17 LAB — BASIC METABOLIC PANEL WITH GFR
BUN: 15 mg/dL (ref 6–23)
CALCIUM: 9.2 mg/dL (ref 8.4–10.5)
CO2: 31 mEq/L (ref 19–32)
Chloride: 98 mEq/L (ref 96–112)
Creat: 0.71 mg/dL (ref 0.50–1.10)
GFR, EST AFRICAN AMERICAN: 89 mL/min
GFR, Est Non African American: 77 mL/min
Glucose, Bld: 193 mg/dL — ABNORMAL HIGH (ref 70–99)
POTASSIUM: 4.2 meq/L (ref 3.5–5.3)
SODIUM: 138 meq/L (ref 135–145)

## 2014-04-17 NOTE — Telephone Encounter (Signed)
Call from [facility] with [facility] [phone]  Nurse wants to clarify medications  Metoprolol 100 mg but was reduced to 50 at hospital discharge and  lasix 20 twice a day and it was listed as lasix 40 mg   Please call nurse and clarify.

## 2014-04-17 NOTE — Assessment & Plan Note (Addendum)
Pt presents for hospital follow-up discharged on 8/27 for worsening dyspnea and productive cough, treated for CHF vs. asthma exacerbation.  Today, she is feeling much better and denies any further episodes of wheezing or SOB. Reports LE swelling has improved.  She is complaint with all meds.  On exam, no signs of volume overload--lungs CTAB and trace LE pitting edema L>R which she reports has improved.   -keep follow up appt with cards  -follow-up with PCP in 1 month -continue current meds

## 2014-04-17 NOTE — Telephone Encounter (Signed)
No medication changes were made today. Pt should be on Lasix 20 mg twice a day and Metoprolol 100 mg once a day. Home O2 has been d/c. Per Dr Delane Ginger

## 2014-04-17 NOTE — Assessment & Plan Note (Signed)
-  declined influenza vaccine (reports getting sick after previous vaccines)

## 2014-04-17 NOTE — Assessment & Plan Note (Signed)
-  recheck BMP today  -continue potassium x 2 daily (40 total)

## 2014-04-17 NOTE — Patient Instructions (Addendum)
Thank you for your visit today.  I'm glad you are feeling better.    Please return to the internal medicine clinic in 1 month(s) or sooner if needed.   I will check your potassium level today and your urine for protein.     Your current medical regimen is effective;  continue present plan and take all medications as prescribed.    Please be sure to bring all of your medications with you to every visit; this includes herbal supplements, vitamins, eye drops, and any over-the-counter medications.   Should you have any questions regarding your medications and/or any new or worsening symptoms, please be sure to call the clinic at [phone].   If you believe that you are suffering from a life threatening condition or one that may result in the loss of limb or function, then you should call 911 or proceed to the nearest Emergency Department.

## 2014-04-17 NOTE — Telephone Encounter (Signed)
This is correct. Thanks

## 2014-04-17 NOTE — Progress Notes (Signed)
Patient ID: [name], female   DOB: 09-24-1925, 80 y.o.  MRN: [id number]    Subjective:   Patient ID: [name] female    DOB: [birth year] 80 y.o.    MRN: [id number] Health Maintenance Due: Health Maintenance Due  Topic Date Due  . Zostavax  12/03/1987  . Foot Exam  05/27/2013  . Urine Microalbumin  03/16/2014  . Influenza Vaccine  03/18/2014    _________________________________________________  HPI: [name] is a 80 y.o. female here for a hospital follow up. Pt has a PMH outlined below.  Please see problem-based charting assessment and plan note for further details of medical issues addressed at today's visit.  PMH: Past Medical History  Diagnosis Date  . Hypertension   . Diabetes mellitus   . Asthma   . Osteoarthritis     left shoulder  . Pseudogout   . Hypokalemia     a. Previously felt due to diuretics, req supplementation.  . Churg-Strauss syndrome     a. Sural nerve biopsy 02/2000.  Darene Lamer disease     a. s/p thyroidectomy.  . Glaucoma   . Hypercholesteremia   . Venous insufficiency   . Osteopenia     DEXA 2004  . Lipoma     left inner thigh  . Cataract     left eye  . VASCULITIS   . Chronic combined systolic and diastolic heart failure     a. 01/9628 EF:20-25%  . Pleural effusion, left     a. Thoracentesis 2001 - per notes, no malignant cells, was transudative.  Marland Kitchen NSVT (nonsustained ventricular tachycardia)     a. During CHF adm 2001.  . Multifocal atrial tachycardia     a. Documented as OP 01/2012.  Marland Kitchen Ectopic atrial tachycardia     a. Documented on tele as IP 02/2012.  . Pulmonary HTN     a. Mod by echo 02/2012.  . Valvular heart disease     a. Echo 02/2012: mod MR/TR, mild AI.  Marland Kitchen Pericardial effusion     a. Echo 02/2012: small-mod pericardial effusion.  . Atrial fibrillation, permanent     a. Dx 04/2013, on metoprolol, dig and xarelto  . Shortness of breath     Medications: Current Outpatient Prescriptions on File Prior to Visit    Medication Sig Dispense Refill  . albuterol (PROVENTIL HFA;VENTOLIN HFA) 108 (90 BASE) MCG/ACT inhaler Inhale 1-2 puffs into the lungs every 6 (six) hours as needed for shortness of breath.  18 g  11  . brimonidine (ALPHAGAN P) 0.1 % SOLN Place 1 drop into the left eye every 12 (twelve) hours.  15 mL  1  . dorzolamide (TRUSOPT) 2 % ophthalmic solution Place 1 drop into the left eye 2 (two) times daily.       . Fluticasone-Salmeterol (ADVAIR) 250-50 MCG/DOSE AEPB Inhale 1 puff into the lungs 2 (two) times daily.  60 each  11  . furosemide (LASIX) 20 MG tablet Take 1 tablet (20 mg total) by mouth 2 (two) times daily.  60 tablet  2  . metFORMIN (GLUCOPHAGE) 1000 MG tablet Take 1 tablet (1,000 mg total) by mouth 2 (two) times daily with a meal.  60 tablet  3  . metoprolol succinate (TOPROL-XL) 100 MG 24 hr tablet Take 1 tablet (100 mg total) by mouth daily. Take with or immediately following a meal.  30 tablet  3  . potassium chloride SA (K-DUR,KLOR-CON) 20 MEQ tablet Take 2 tablets (40 mEq total) by mouth daily.  60 tablet  2  . Rivaroxaban (XARELTO) 15 MG TABS tablet Take 1 tablet (15 mg total) by mouth daily with supper.  30 tablet  0   No current facility-administered medications on file prior to visit.    Allergies: No Known Allergies  FH: Family History  Problem Relation Age of Onset  . Diabetes Mother   . Diabetes Father   . Heart disease Neg Hx     SH: History   Social History  . Marital Status: Widowed    Spouse Name: N/A    Number of Children: N/A  . Years of Education: N/A   Occupational History  . Retired    Social History Main Topics  . Smoking status: Never Smoker   . Smokeless tobacco: Never Used  . Alcohol Use: No  . Drug Use: No  . Sexual Activity: None   Other Topics Concern  . None   Social History Narrative   Lives in house, with family just next door.    Never smoker, no alcohol.      Used to work at [employer].    Before that,  worked in food prep.                    Review of Systems: Constitutional: Negative for fever, chills and weight loss.  Eyes: Negative for blurred vision.  Respiratory: Negative for cough and shortness of breath.  Cardiovascular: Negative for chest pain, palpitations and leg swelling.  Gastrointestinal: Negative for nausea, vomiting, abdominal pain, diarrhea, constipation and blood in stool.  Genitourinary: Negative for dysuria, urgency and frequency.  Musculoskeletal: Negative for myalgias and back pain.  Neurological: Negative for dizziness, weakness and headaches.     Objective:   Vital Signs: Filed Vitals:   04/17/14 1045  BP: 117/76  Pulse: 114  Temp: 98 F (36.7 C)  TempSrc: Oral  Height:  (1.651 m)  Weight: 160 lb (72.576 kg)     BP Readings from Last 3 Encounters:  04/17/14 117/76  04/13/14 110/60  02/24/14 145/97    Physical Exam: Constitutional: Vital signs reviewed.  Patient is well-developed and well-nourished in NAD and cooperative with exam.  Head: Normocephalic and atraumatic. Eyes: PERRL, EOMI, conjunctivae nl, no scleral icterus.  Neck: Supple. Cardiovascular: RRR, no MRG. Pulmonary/Chest: normal effort, CTAB, no wheezes, rales, or rhonchi. Abdominal: Soft. NT/ND +BS. Neurological: A&O x3, cranial nerves II-XII are grossly intact, moving all extremities. Extremities: 2+DP b/l; trace pitting edema b/l, L>R Skin: Warm, dry and intact. No rash.  Most Recent Laboratory Results:  CMP     Component Value Date/Time   NA 134* 04/13/2014 1040   K 3.3* 04/13/2014 1040   CL 88* 04/13/2014 1040   CO2 36* 04/13/2014 1040   GLUCOSE 197* 04/13/2014 1040   BUN 22 04/13/2014 1040   CREATININE 0.67 04/13/2014 1040   CREATININE 0.65 12/29/2013 1557   CALCIUM 8.7 04/13/2014 1040   PROT 7.4 04/05/2014 1140   ALBUMIN 3.5 04/05/2014 1140   AST 50* 04/05/2014 1140   ALT 8 04/05/2014 1140   ALKPHOS 86 04/05/2014 1140   BILITOT 1.4* 04/05/2014 1140   GFRNONAA 77*  04/13/2014 1040   GFRNONAA 81 12/29/2013 1557   GFRAA 90* 04/13/2014 1040   GFRAA >89 12/29/2013 1557    CBC    Component Value Date/Time   WBC 3.7* 04/13/2014 1100   RBC 4.50 04/13/2014 1100   HGB 13.4 04/13/2014 1100   HCT 42.2 04/13/2014 1100  PLT 308 04/13/2014 1100   MCV 93.8 04/13/2014 1100   MCH 29.8 04/13/2014 1100   MCHC 31.8 04/13/2014 1100   RDW 14.7 04/13/2014 1100   LYMPHSABS 1.4 04/12/2014 0826   MONOABS 0.6 04/12/2014 0826   EOSABS 0.2 04/12/2014 0826   BASOSABS 0.0 04/12/2014 0826    Lipid Panel Lab Results  Component Value Date   CHOL 129 04/06/2014   HDL 54 04/06/2014   LDLCALC 65 04/06/2014   TRIG 52 04/06/2014   CHOLHDL 2.4 04/06/2014    HA1C Lab Results  Component Value Date   HGBA1C 7.9* 04/05/2014    Urinalysis    Component Value Date/Time   COLORURINE YELLOW 02/17/2014 1259   APPEARANCEUR CLEAR 02/17/2014 1259   LABSPEC 1.008 02/17/2014 1259   PHURINE 7.0 02/17/2014 1259   GLUCOSEU NEGATIVE 02/17/2014 1259   HGBUR TRACE* 02/17/2014 1259   HGBUR trace-lysed 07/17/2008 0902   BILIRUBINUR NEGATIVE 02/17/2014 1259   KETONESUR NEGATIVE 02/17/2014 1259   PROTEINUR 30* 02/17/2014 1259   UROBILINOGEN 1.0 02/17/2014 1259   NITRITE NEGATIVE 02/17/2014 1259   LEUKOCYTESUR NEGATIVE 02/17/2014 1259    Urine Microalbumin Lab Results  Component Value Date   MICROALBUR 263.89* 03/16/2013    Imaging N/A   Assessment & Plan:   Assessment and plan was discussed and formulated with my attending.

## 2014-04-17 NOTE — Assessment & Plan Note (Addendum)
-  continue xarelto 15mg  daily (dose adjusted for CrCl at hospital d/c) -follow up with cards on 9/15

## 2014-04-18 LAB — MICROALBUMIN / CREATININE URINE RATIO
Creatinine, Urine: 62.6 mg/dL
Microalb Creat Ratio: 499.2 mg/g — ABNORMAL HIGH (ref 0.0–30.0)
Microalb, Ur: 31.25 mg/dL — ABNORMAL HIGH (ref 0.00–1.89)

## 2014-04-18 NOTE — Assessment & Plan Note (Signed)
-  check urine ACR

## 2014-04-18 NOTE — Progress Notes (Signed)
Internal Medicine Clinic Attending Date of visit: 04/17/2014   Case discussed with Dr. Gill soon after the resident saw the patient.  We reviewed the resident's history and exam and pertinent patient test results.  I agree with the assessment, diagnosis, and plan of care documented in the resident's note. 

## 2014-04-18 NOTE — Telephone Encounter (Signed)
Home Health nurse informed.

## 2014-04-19 ENCOUNTER — Encounter: Payer: PRIVATE HEALTH INSURANCE | Admitting: Internal Medicine

## 2014-04-21 ENCOUNTER — Telehealth: Payer: Self-pay | Admitting: Internal Medicine

## 2014-04-21 NOTE — Telephone Encounter (Signed)
Called by [facility] RN [phone] increased sob, wheezing. Recently Lasix medication changed (decreased).  She has Dulera inhaler, Albuterol, Advair and is using Advair and Albuterol w/o relief of sx's.  RN asked what to do.  I informed pt can come to ED or wait for clinic appt Tues. Will forward front desk info to make appt next week   Shirlee Latch MD

## 2014-04-24 ENCOUNTER — Other Ambulatory Visit: Payer: Self-pay | Admitting: Internal Medicine

## 2014-04-26 ENCOUNTER — Encounter: Payer: Self-pay | Admitting: Internal Medicine

## 2014-04-26 ENCOUNTER — Ambulatory Visit (INDEPENDENT_AMBULATORY_CARE_PROVIDER_SITE_OTHER): Payer: PRIVATE HEALTH INSURANCE | Admitting: Internal Medicine

## 2014-04-26 VITALS — BP 129/94 | HR 69 | Temp 97.4°F | Ht 65.0 in | Wt 166.3 lb

## 2014-04-26 DIAGNOSIS — I4891 Unspecified atrial fibrillation: Secondary | ICD-10-CM

## 2014-04-26 DIAGNOSIS — S31809A Unspecified open wound of unspecified buttock, initial encounter: Secondary | ICD-10-CM

## 2014-04-26 DIAGNOSIS — I482 Chronic atrial fibrillation, unspecified: Secondary | ICD-10-CM

## 2014-04-26 DIAGNOSIS — S31000A Unspecified open wound of lower back and pelvis without penetration into retroperitoneum, initial encounter: Secondary | ICD-10-CM

## 2014-04-26 DIAGNOSIS — I5043 Acute on chronic combined systolic (congestive) and diastolic (congestive) heart failure: Secondary | ICD-10-CM

## 2014-04-26 MED ORDER — LISINOPRIL 5 MG PO TABS: 5.0000 mg | ORAL_TABLET | Freq: Every day | ORAL | Status: DC

## 2014-04-26 MED ORDER — FUROSEMIDE 40 MG PO TABS: 40.0000 mg | ORAL_TABLET | Freq: Two times a day (BID) | ORAL | Status: DC

## 2014-04-26 NOTE — Assessment & Plan Note (Signed)
She has a healing/scabbed 1/2 inch superficial wound in the sacral area.  She did not notice it prior to admission but says one of the RNs noticed it while helping her use the bathroom at her clinic follow-up appointment last week.  Today it looks well healed.  There is no redness, increased warmth or tenderness.  The skin has scabbed over.  The small area of skin breakdown may have occurred due to increase time in bed while hospitalized.  - I advised her son to buy silicone pads from medical supply store to cover the area and provide some cushioning. - she will monitor the area for changes/worsening

## 2014-04-26 NOTE — Progress Notes (Signed)
   Subjective:    Patient ID: [name], female    DOB: 02-Jan-1926, 80 y.o.   MRN: [id number]  HPI Comments: [name] is an 80 year old woman with a PMH of atrial fibrillation and PE (on Xarelto), HTN, systolic and diastolic HF (EF 76-72%), DM type 2, asthma and glaucoma.   She was recently seen for hospital follow-up after admission for acute HF exacerbation and asthma exacerbation.  Lasix was decreased during admission (due to low BPs) and oxygen was ordered at d/c.  The patient says she never received the home oxygen.  She was doing well at hospital follow-up visit but in recent days she has had increased dyspnea with exertion and increasing PND.  She occasionally wheezes but feels her breathing is better with [facility] (which she was given at discharge) as opposed to Advair.  She denies fever, change in appetite.  She is having some diarrhea that she feels is resolving.         Review of Systems  Constitutional: Negative for fever, chills and appetite change.  Eyes: Negative for visual disturbance.  Respiratory: Positive for shortness of breath.   Cardiovascular: Positive for palpitations. Negative for chest pain and leg swelling.       Occasional fast HR when dyspneic  Gastrointestinal: Positive for diarrhea. Negative for nausea, vomiting and blood in stool.  Genitourinary: Negative for dysuria and hematuria.  Skin: Positive for wound.       Sacral wound  Neurological: Negative for light-headedness.       Objective:   Physical Exam  Vitals reviewed. Constitutional: She is oriented to person, place, and time. She appears well-developed. No distress.  HENT:  Mouth/Throat: Oropharynx is clear and moist. No oropharyngeal exudate.  Eyes: Pupils are equal, round, and reactive to light.  Cardiovascular: Normal rate and normal heart sounds.  Exam reveals no gallop and no friction rub.   No murmur heard. irregular  Pulmonary/Chest: Effort normal and breath sounds normal. No  respiratory distress. She has no wheezes. She has no rales.  Abdominal: Soft. She exhibits no distension. There is no tenderness.  Musculoskeletal: Normal range of motion. She exhibits edema. She exhibits no tenderness.  1+ B/L pretibial edema; legs are non-tender, non-erythematous  Neurological: She is alert and oriented to person, place, and time.  Skin: Skin is warm. She is not diaphoretic.  1/2 inch healed/scabbed area on sacral area.  No redness, increased warmth or tenderness.  Psychiatric: She has a normal mood and affect. Her behavior is normal.          Assessment & Plan:  Please see problem based assessment and plan.

## 2014-04-26 NOTE — Assessment & Plan Note (Signed)
[name] reports increasing DOE and orthopnea in the past few days.  Her LE swelling had been improving after discharge but has returned today.  She says she heard some wheezing yesterday.  She reports that Advair was discontinued during admission and she was given a Dulera pump at discharge which she feels is working better than Advair did.  She has not had to increase rescue inhaler use.  She was requiring 2L of oxygen during admission but patient and her son report that the oxygen was never delivered.  Her Lasix was decreased from 40mg  BID to 20mg  BID during admission and I believe this is the major contributor to her increased dyspnea given her symptoms on exertion and at night.  I do not hear crackles or wheezes on exam.  She is satting 88% with exertion in office but improved with oxygen. - 2L continuous oxygen ordered and tank delivered to patient during clinic visit - Lasix increased to 40mg  BID (patient's pre-admission dose), hopefully she will not require oxygen if breathing is back to baseline with increased Lasix - lisinopril restarted at low dose, 5mg  given normotension and increased Lasix (she had previously been on Lisinopril 20mg  daily but this was held in hospital due to low BP) - visiting RN is coming to home several times per week and checks BP - I have asked her son to show the AVS with instructions to home RN and to have the RN call me if BPs are low with increased dose of Lasix and addition of Lasix - Ms. Alexandra will follow-up with cardiology on 09/15. - I have asked her to return for BMP in 2 weeks unless labs are checked at the cardiologist office.  Renal function has always been good. - She will return to see me on 10/07 or sooner if her dyspnea does not improve with increased Lasix dose

## 2014-04-26 NOTE — Assessment & Plan Note (Signed)
Her Xarelto dose was decreased from 20mg  daily to 15mg  daily during recent admission due to pharmacy concerns about CrCl.  Most recent CrCl by [facility] seems to be around 65 so we may be able to increase dose again. - will defer to cardiology as she has an appointment on 09/15

## 2014-04-26 NOTE — Patient Instructions (Signed)
1. I have increased your Lasix to 40mg  twice per day.  I have added back Lisinopril at 5mg  daily.  Please pick up these medications from your pharmacy.  Call me if there are problems.    2. Please take all medications as prescribed.   3. If you have worsening of your symptoms or new symptoms arise, please call the clinic ([phone]), or go to the ER immediately if symptoms are severe.  Please return for labs on in two weeks unless the cardiologist orders labs next week.   Please return to see me on Oct 7th at 2:15PM.  Come back sooner if your breathing does not improve with increased Lasix or if you are needing more oxygen.

## 2014-04-27 LAB — GLUCOSE, CAPILLARY: Glucose-Capillary: 128 mg/dL — ABNORMAL HIGH (ref 70–99)

## 2014-04-27 NOTE — Progress Notes (Signed)
Case discussed with Dr. Wilson soon after the resident saw the patient.  We reviewed the resident's history and exam and pertinent patient test results.  I agree with the assessment, diagnosis and plan of care documented in the resident's note. 

## 2014-05-02 ENCOUNTER — Ambulatory Visit (INDEPENDENT_AMBULATORY_CARE_PROVIDER_SITE_OTHER): Payer: PRIVATE HEALTH INSURANCE | Admitting: Cardiology

## 2014-05-02 ENCOUNTER — Encounter: Payer: Self-pay | Admitting: Cardiology

## 2014-05-02 VITALS — BP 120/60 | HR 85 | Ht 65.0 in | Wt 165.0 lb

## 2014-05-02 DIAGNOSIS — I4891 Unspecified atrial fibrillation: Secondary | ICD-10-CM

## 2014-05-02 DIAGNOSIS — M301 Polyarteritis with lung involvement [Churg-Strauss]: Secondary | ICD-10-CM

## 2014-05-02 DIAGNOSIS — I272 Pulmonary hypertension, unspecified: Secondary | ICD-10-CM

## 2014-05-02 DIAGNOSIS — D7218 Eosinophilia in diseases classified elsewhere: Secondary | ICD-10-CM

## 2014-05-02 DIAGNOSIS — I2789 Other specified pulmonary heart diseases: Secondary | ICD-10-CM

## 2014-05-02 DIAGNOSIS — I428 Other cardiomyopathies: Secondary | ICD-10-CM

## 2014-05-02 DIAGNOSIS — E119 Type 2 diabetes mellitus without complications: Secondary | ICD-10-CM

## 2014-05-02 DIAGNOSIS — R5381 Other malaise: Secondary | ICD-10-CM

## 2014-05-02 DIAGNOSIS — I482 Chronic atrial fibrillation, unspecified: Secondary | ICD-10-CM

## 2014-05-02 DIAGNOSIS — Z7901 Long term (current) use of anticoagulants: Secondary | ICD-10-CM

## 2014-05-02 DIAGNOSIS — M3130 Wegener's granulomatosis without renal involvement: Secondary | ICD-10-CM

## 2014-05-02 NOTE — Progress Notes (Signed)
05/02/2014 [name]   18-Jan-1926  [id number]  Primary Physicia Evelena Peat, DO Primary Cardiologist: Dr Antoine Poche (new)  HPI:  The pt is an 80 y.o. female with a history of HTN, DMT2, asthma, Churg-Strauss syndrome, permanent  AFIB on metoprolol and Xarelto, combine systolic and diastolic CHF with (EF 40-45%), and recent submassive PE (08/2013) who presented to [facility] on 04/05/14 with worsening shortness of breath and cough and found to have acute on chronic CHF exacerbation as well as acute asthma exacerbation. She was admitted for diuresis. During her hospitalization she had problems with hypotension. Her diuretics were cut back at discharge. She was recently seen by Dr Andrey Campanile and her diuretics were adjusted and he facilitated home O2 for the pt. She is seen here for her post hospital follow up. She is feeling better after Dr Andrey Campanile adjusted her medications. She is fairly debilitated and presented to the office in a wheel chair. Overall her dyspnea and edema are improved.     Current Outpatient Prescriptions  Medication Sig Dispense Refill  . albuterol (PROVENTIL HFA;VENTOLIN HFA) 108 (90 BASE) MCG/ACT inhaler Inhale 1-2 puffs into the lungs every 6 (six) hours as needed for shortness of breath.  18 g  11  . brimonidine (ALPHAGAN P) 0.1 % SOLN Place 1 drop into the left eye every 12 (twelve) hours.  15 mL  1  . dorzolamide (TRUSOPT) 2 % ophthalmic solution Place 1 drop into the left eye 2 (two) times daily.       . Fluticasone-Salmeterol (ADVAIR) 250-50 MCG/DOSE AEPB Inhale 1 puff into the lungs 2 (two) times daily.  60 each  11  . furosemide (LASIX) 40 MG tablet Take 1 tablet (40 mg total) by mouth 2 (two) times daily.  60 tablet  1  . lisinopril (PRINIVIL,ZESTRIL) 5 MG tablet Take 1 tablet (5 mg total) by mouth daily.  30 tablet  1  . metFORMIN (GLUCOPHAGE) 1000 MG tablet Take 1 tablet (1,000 mg total) by mouth 2 (two) times daily with a meal.  60 tablet  3  . metoprolol succinate  (TOPROL-XL) 100 MG 24 hr tablet Take 1 tablet (100 mg total) by mouth daily. Take with or immediately following a meal.  30 tablet  3  . potassium chloride SA (K-DUR,KLOR-CON) 20 MEQ tablet Take 2 tablets (40 mEq total) by mouth daily.  60 tablet  2  . Rivaroxaban (XARELTO) 15 MG TABS tablet Take 1 tablet (15 mg total) by mouth daily with supper.  30 tablet  0   No current facility-administered medications for this visit.    No Known Allergies  History   Social History  . Marital Status: Widowed    Spouse Name: N/A    Number of Children: N/A  . Years of Education: N/A   Occupational History  . Retired    Social History Main Topics  . Smoking status: Never Smoker   . Smokeless tobacco: Never Used  . Alcohol Use: No  . Drug Use: No  . Sexual Activity: Not on file   Other Topics Concern  . Not on file   Social History Narrative   Lives in house, with family just next door.    Never smoker, no alcohol.      Used to work at [employer].    Before that, worked in food prep.                     Review of Systems: General: negative  for chills, fever, night sweats or weight changes.  Cardiovascular: negative for chest pain, orthopnea, palpitations, paroxysmal nocturnal dyspnea or shortness of breath Dermatological: negative for rash Respiratory: negative for cough or wheezing Urologic: negative for hematuria Abdominal: negative for nausea, vomiting, diarrhea, bright red blood per rectum, melena, or hematemesis Neurologic: negative for visual changes, syncope, or dizziness All other systems reviewed and are otherwise negative except as noted above.    Blood pressure 120/60, pulse 85, height  (1.651 m), weight 165 lb (74.844 kg).  General appearance: alert, cooperative, no distress, moderately obese and in wheel chair Lungs: clear to auscultation bilaterally Heart: irregularly irregular rhythm and 2/6 systolic murmur at AOv and MV area Extremities:  Lt LE 1+ edema, trace edma on Rt  EKG AF with CVR, PVCs  ASSESSMENT AND PLAN:   Acute on chronic combined systolic and diastolic congestive heart failure Just discharged 04/13/14  Chronic atrial fibrillation Rate controlled  Current use of long term anticoagulation--xarelto For AF and history of large PE Jan 2015  Churg-Strauss syndrome .  DIABETES MELLITUS, TYPE II On oral agents  HYPERTENSION Controlled  Pulmonary embolism Jan 2015  Debilitated patient Wheel chair, home O2  CARDIOMYOPATHY, DILATED EF 35% by echo Aug 2015  Pulmonary hypertension PA pressure 62 mmHg by echo Aug 2015   PLAN  I did not change her medications. Dr Andrey Campanile has ordered follow up labs. I'll arrange for her to f/u with Dr Antoine Poche (she initially saw Dr Daleen Squibb in 2013 but has not seen a cardiologist in the office since. Dr Antoine Poche saw her several times during her recent admission).   [name] KPA-C 05/02/2014 3:12 PM

## 2014-05-02 NOTE — Patient Instructions (Signed)
NO CHANGE WITH CURRENT MEDICATIONS   Your physician wants you to follow-up in 3 MONTH [facility].  You will receive a reminder letter in the mail two months in advance. If you don't receive a letter, please call our office to schedule the follow-up appointment.

## 2014-05-02 NOTE — Assessment & Plan Note (Signed)
Jan 2015

## 2014-05-02 NOTE — Assessment & Plan Note (Signed)
Wheel chair, home O2

## 2014-05-02 NOTE — Assessment & Plan Note (Signed)
On oral agents 

## 2014-05-02 NOTE — Assessment & Plan Note (Signed)
EF 35% by echo Aug 2015

## 2014-05-02 NOTE — Assessment & Plan Note (Addendum)
PA pressure 62 mmHg by echo Aug 2015

## 2014-05-02 NOTE — Assessment & Plan Note (Signed)
Just discharged 04/13/14

## 2014-05-02 NOTE — Assessment & Plan Note (Signed)
Rate controlled 

## 2014-05-02 NOTE — Assessment & Plan Note (Signed)
Controlled.  

## 2014-05-02 NOTE — Assessment & Plan Note (Addendum)
For AF and history of large PE Jan 2015

## 2014-05-11 ENCOUNTER — Other Ambulatory Visit (HOSPITAL_COMMUNITY): Payer: Self-pay | Admitting: Internal Medicine

## 2014-05-23 ENCOUNTER — Other Ambulatory Visit: Payer: Self-pay | Admitting: Internal Medicine

## 2014-05-24 ENCOUNTER — Encounter: Payer: Self-pay | Admitting: Internal Medicine

## 2014-05-24 ENCOUNTER — Telehealth: Payer: Self-pay | Admitting: Internal Medicine

## 2014-05-24 ENCOUNTER — Ambulatory Visit (INDEPENDENT_AMBULATORY_CARE_PROVIDER_SITE_OTHER): Payer: PRIVATE HEALTH INSURANCE | Admitting: Internal Medicine

## 2014-05-24 ENCOUNTER — Ambulatory Visit (HOSPITAL_COMMUNITY)
Admission: RE | Admit: 2014-05-24 | Discharge: 2014-05-24 | Disposition: A | Discharge status: Home / Self Care | Payer: PRIVATE HEALTH INSURANCE | Source: Ambulatory Visit | Attending: Internal Medicine | Admitting: Internal Medicine

## 2014-05-24 VITALS — BP 155/91 | HR 97 | Temp 98.3°F | Ht 67.0 in | Wt 167.7 lb

## 2014-05-24 DIAGNOSIS — E119 Type 2 diabetes mellitus without complications: Secondary | ICD-10-CM

## 2014-05-24 DIAGNOSIS — J189 Pneumonia, unspecified organism: Secondary | ICD-10-CM

## 2014-05-24 DIAGNOSIS — M7989 Other specified soft tissue disorders: Secondary | ICD-10-CM

## 2014-05-24 DIAGNOSIS — I5042 Chronic combined systolic (congestive) and diastolic (congestive) heart failure: Secondary | ICD-10-CM

## 2014-05-24 DIAGNOSIS — I482 Chronic atrial fibrillation, unspecified: Secondary | ICD-10-CM

## 2014-05-24 DIAGNOSIS — S31000D Unspecified open wound of lower back and pelvis without penetration into retroperitoneum, subsequent encounter: Secondary | ICD-10-CM

## 2014-05-24 DIAGNOSIS — I10 Essential (primary) hypertension: Secondary | ICD-10-CM

## 2014-05-24 DIAGNOSIS — J45909 Unspecified asthma, uncomplicated: Secondary | ICD-10-CM

## 2014-05-24 DIAGNOSIS — R609 Edema, unspecified: Secondary | ICD-10-CM

## 2014-05-24 LAB — GLUCOSE, CAPILLARY: Glucose-Capillary: 110 mg/dL — ABNORMAL HIGH (ref 70–99)

## 2014-05-24 MED ORDER — MOMETASONE FURO-FORMOTEROL FUM 100-5 MCG/ACT IN AERO: 2.0000 | INHALATION_SPRAY | Freq: Two times a day (BID) | RESPIRATORY_TRACT | Status: DC

## 2014-05-24 NOTE — Progress Notes (Signed)
   Subjective:    Patient ID: [name], female    DOB: 1926-03-17, 80 y.o.   MRN: [id number]  HPI Comments: [name] is an 80 year old woman with a PMH of atrial fibrillation and PE (on Xarelto), HTN, systolic and diastolic HF (EF 51-02%), DM type 2, asthma and glaucoma here for follow-up of dyspnea.  Her Lasix dose had been discharged at hospital discharge and she had c/o dyspnea at our visit 2 weeks ago.  Her dyspnea improved after increasing Lasix at last visit.  She thinks Elwin Sleight is working.  She is now only using oxygen at night.     Review of Systems  Constitutional: Negative for fever and appetite change.  Respiratory: Negative for cough and shortness of breath.   Cardiovascular: Positive for leg swelling. Negative for chest pain and palpitations.  Gastrointestinal: Negative for nausea, vomiting, diarrhea, constipation and blood in stool.  Genitourinary: Negative for dysuria.  Neurological: Negative for weakness, light-headedness and headaches.       Objective:   Physical Exam  Constitutional: She is oriented to person, place, and time. She appears well-developed. No distress.  HENT:  Head: Normocephalic and atraumatic.  Mouth/Throat: Oropharynx is clear and moist. No oropharyngeal exudate.  Eyes: EOM are normal.  Cardiovascular: Normal rate and normal heart sounds.  Exam reveals no gallop and no friction rub.   No murmur heard. Irregular  Pulmonary/Chest: Effort normal and breath sounds normal. No respiratory distress. She has no wheezes. She has no rales.  Abdominal: Soft. Bowel sounds are normal. She exhibits no distension. There is no tenderness. There is no rebound.  Musculoskeletal: Normal range of motion. She exhibits edema. She exhibits no tenderness.  1+ pitting edema right leg 2+ pitting edema left leg and thigh Extremities are non-tender  Neurological: She is alert and oriented to person, place, and time. No cranial nerve deficit.  Skin: Skin is warm. She is  not diaphoretic.  No wounds on feet  Psychiatric: She has a normal mood and affect. Her behavior is normal.          Assessment & Plan:  Please see problem based assessment and plan.

## 2014-05-24 NOTE — Telephone Encounter (Signed)
I called Ms. Kolarik's daughter and informed her that left lower extremity doppler (preliminary) report is negative.

## 2014-05-24 NOTE — Assessment & Plan Note (Addendum)
She has presented with unilateral leg swelling in the past (left > right) but this time the swelling is even more asymmetric and pronounced.  Ms. Vanloo noticed this about two days ago.  The leg is not painful or tender.  She has been compliant with her Xarelto and ambulates around her home with the help of a walker.  She has not been on any recent lengthy car trips or plane rides.  She has not been sitting for prolonged periods of time.  She reports bad veins in the left leg.  Since the left leg is often more swollen than the right it may have been the leg from which her prior PE originated.   - lower extremity duplex to rule out DVT, though this is less likely since she is compliant with Xarelto - Duplex negative - I advised her to elevate the legs when seated, continue Lasix and try compression stockings; she is ambulating at home with her walker; home health PT was ordered at hospital discharge but she says PT has not been to her home recently so I will try to re-initiate this service.

## 2014-05-24 NOTE — Assessment & Plan Note (Addendum)
[name] dyspnea has improved with resuming her usual Lasix dose (40mg  BID).  She is only using oxygen at night.  She denies dyspnea, SpO2 is 99% on room air and lungs are clear on exam.  She does have lower extremity edema (L<R).  Home Health RN was arranged at hospital discharge, however [name] says the RN has not been to her home in several weeks.  - continue po Lasix 40mg  BID, Toprol XL 100mg  daily, lisinopril 5mg  daily - I will ask CSW to touch base with [facility] to find out if RN can continue to make home visits - left lower extremity duplex to rule out DVT in left leg

## 2014-05-24 NOTE — Telephone Encounter (Addendum)
Received a page from the Vascular lab with preliminary result for Doppler US of Left lower leg negative for DVT.

## 2014-05-24 NOTE — Assessment & Plan Note (Addendum)
I am not sure if she ever carried diagnosis of Grave's since all the TSH values in EPIC are WNL dating back to 2008 and including most recent value in April 2015.  I do not see that antibody testing was carried out.

## 2014-05-24 NOTE — Assessment & Plan Note (Signed)
I spoke with our pharmacist in clinic about [name]'s Xarelto dose being decreased from 20mg  to 15mg  (at the request of inpatient pharmacy team) during her recent hospitalization despite, what looks to me to be a normal Cr and only slightly reduced GFR.  She informed me that the pharmacy may have rounded Ms. Digman's Cr of 0.7 to 1 and when added to the [employer] this results in a lower CrCl (~40s).  For the indication of Atrial fibrillation, CrCl 15-50 requires dose reduction to 15mg .  For the indication of DVT/PE, there is no dose adjustment but use is not advised with a CrCl < 30.   - will continue Xarelto 15mg  daily based on CrCl < 50; continue Toprol XL 100mg  daily

## 2014-05-24 NOTE — Assessment & Plan Note (Signed)
Lab Results  Component Value Date   HGBA1C 7.9* 04/05/2014   HGBA1C 6.7 11/24/2013   HGBA1C 6.6 08/25/2013     Assessment: Diabetes control: fair control Progress toward A1C goal:   at goal (<8.0 given her age and co-morbidities) but worse than prior reading  Plan: Medications:  continue current medications:  Metformin 1000mg  BID Home glucose monitoring: Frequency: no home glucose monitoring Timing:   Instruction/counseling given: discussed foot care and discussed diet Educational resources provided: brochure Other plans: Advised to limit carbs/sweets during the approaching holidays because of the impact they will have on her blood sugar.  RTC in 2 months.

## 2014-05-24 NOTE — Assessment & Plan Note (Signed)
BP Readings from Last 3 Encounters:  05/24/14 155/91  05/02/14 120/60  04/26/14 129/94    Lab Results  Component Value Date   NA 138 04/17/2014   K 4.2 04/17/2014   CREATININE 0.71 04/17/2014    Assessment: Blood pressure control: slightly elevated but close to goal of 150/90 for someone > age 78. Progress toward BP goal:   near goal Comment:  She is usually well below < 150/90 and just slightly elevated at 155/91 today.  Given her age, co-morbidities and the fact that she is usually very well controlled I will not make any changes to her current medication regimen as this would put her at risk for hypoperfusion, orthostatic hypotension and possibly falls.  Plan: Medications:  continue current medications:  Toprol XL 100mg  daily, lisinopril 5mg  daily, Lasix 40mg  BID Other plans: RTC in 2 months for follow-up.

## 2014-05-24 NOTE — Telephone Encounter (Deleted)
I was able to talk to Dr. Andrey Campanile and discuss the prelim results. She agreed with the pt going home.

## 2014-05-24 NOTE — Patient Instructions (Signed)
1. I will call you if there are abnormalities on ultrasound of your leg.  Please keep taking your Lasix twice per day.  Elevate your legs when seated. I will check to see why home health RN and PT have not continued coming to your home.     2. Please take all medications as prescribed.    3. If you have worsening of your symptoms or new symptoms arise, please call the clinic ([phone]), or go to the ER immediately if symptoms are severe.    Treatment Goals:  Goals (1 Years of Data) as of 05/24/14         As of Today 05/02/14 04/26/14 04/17/14 04/13/14     Blood Pressure    . Blood Pressure < 140/90  155/91 120/60 129/94 117/76 110/60     Result Component    . HEMOGLOBIN A1C < 8.0          . LDL CALC < 100            Progress Toward Treatment Goals:  Treatment Goal 03/16/2013  Hemoglobin A1C at goal  Blood pressure unchanged    Self Care Goals & Plans:  Self Care Goal 05/24/2014  Manage my medications take my medicines as prescribed; bring my medications to every visit; refill my medications on time  Monitor my health keep track of my blood glucose; bring my glucose meter and log to each visit  Eat healthy foods drink diet soda or water instead of juice or soda; eat more vegetables; eat foods that are low in salt; eat baked foods instead of fried foods; eat fruit for snacks and desserts  Be physically active -  Other -  Meeting treatment goals -    Home Blood Glucose Monitoring 05/24/2014  Check my blood sugar no home glucose monitoring  When to check my blood sugar -     Care Management & Community Referrals:  Referral 03/16/2013  Referrals made for care management support none needed  Referrals made to community resources none

## 2014-05-24 NOTE — Assessment & Plan Note (Signed)
The scabbed area in the sacral area has completed healed and was not evident on exam today.

## 2014-05-24 NOTE — Assessment & Plan Note (Addendum)
She felt Advair was not working and preferred [employer] that she received in the hospital.  She has brought the [facility] inhalers with her today and request refill.  She has not been taking Advair and she understands that she should discard her Advair now that she is taking Dulera.  She is not dyspneic, her SpO2 is 99% on room air and her lungs are clear. - continue Dulera 100-22mcg two puffs BID and albuterol inhaler q6h prn

## 2014-05-25 ENCOUNTER — Telehealth: Payer: Self-pay | Admitting: Licensed Clinical Social Worker

## 2014-05-25 NOTE — Telephone Encounter (Signed)
CSW placed call to Ms. Kempel to determine if PCS were of need or if pt was current with services.   CSW left message requesting return call. CSW provided contact hours and phone number.

## 2014-05-27 ENCOUNTER — Other Ambulatory Visit: Payer: Self-pay | Admitting: Internal Medicine

## 2014-05-29 ENCOUNTER — Telehealth: Payer: Self-pay | Admitting: Internal Medicine

## 2014-05-29 NOTE — Progress Notes (Signed)
Case discussed with Dr. Wilson at the time of the visit.  We reviewed the resident's history and exam and pertinent patient test results.  I agree with the assessment, diagnosis and plan of care documented in the resident's note. 

## 2014-05-29 NOTE — Telephone Encounter (Signed)
  Reason for call:   I placed an outgoing call to [name] (daughter) re  Ms. Ferne Coe at 7pm  regarding [place] Prior authorization .  She reports that Advair did not work for her and they tried to get the [facility] medication that Dr. Andrey Campanile perscribed last week but could not at the pharmacy (needs prior auth).   Assessment/ Plan:   Will forward this message to Dr. Andrey Campanile to see if she will complete a prior auth for dulera or change medications.  As always, pt is advised that if symptoms worsen or new symptoms arise, they should go to an urgent care facility or to to ER for further evaluation.   Gust Rung, DO   05/29/2014, 7:02 PM

## 2014-05-30 ENCOUNTER — Telehealth: Payer: Self-pay | Admitting: *Deleted

## 2014-05-30 NOTE — Telephone Encounter (Signed)
Contacted pt's insurance at [phone] to initiate PA-request approved for one year, pharmacy aware and will let pt know.Wendy Spittle Cassady10/13/201511:19 AM      Conf# [id number] (one year) Pt ID# [id number] Dx: Asthma

## 2014-05-30 NOTE — Telephone Encounter (Signed)
Fax from [employer] - Elwin Sleight is not covered by [employer] plan and does not give any alternatives.  Please send new rx .  Thanks

## 2014-05-31 ENCOUNTER — Other Ambulatory Visit: Payer: Self-pay | Admitting: Internal Medicine

## 2014-05-31 ENCOUNTER — Telehealth: Payer: Self-pay | Admitting: Internal Medicine

## 2014-05-31 NOTE — Telephone Encounter (Signed)
I attempted to call [name] ([name] daughter) to see if she would like to use Advair while we await insurance prior authorization for [name].  [name] was using Advair prior to recent admission, however she was preferred the [name] that she was provided in the [place] (it is on [place] formulary instead of Advair).  I was unable to reach [name] at the number listed.  I also tried the number listed for [name]'s son [name] but I was unable to reach him.

## 2014-06-01 NOTE — Telephone Encounter (Signed)
CSW placed called to pt.  CSW left message requesting return call. CSW provided contact hours and phone number. 

## 2014-06-05 NOTE — Telephone Encounter (Signed)
CSW placed called to pt.  CSW left message requesting return call. CSW provided contact hours and phone number.  Will send letter, no response back from pt/family.

## 2014-06-09 ENCOUNTER — Other Ambulatory Visit (HOSPITAL_COMMUNITY): Payer: Self-pay | Admitting: Oncology

## 2014-06-14 ENCOUNTER — Other Ambulatory Visit: Payer: Self-pay | Admitting: Internal Medicine

## 2014-06-21 ENCOUNTER — Other Ambulatory Visit: Payer: Self-pay | Admitting: *Deleted

## 2014-06-22 MED ORDER — LISINOPRIL 5 MG PO TABS: 5.0000 mg | ORAL_TABLET | Freq: Every day | ORAL | Status: DC

## 2014-07-10 ENCOUNTER — Other Ambulatory Visit: Payer: Self-pay | Admitting: Internal Medicine

## 2014-07-23 ENCOUNTER — Other Ambulatory Visit: Payer: Self-pay | Admitting: Internal Medicine

## 2014-08-01 ENCOUNTER — Encounter: Payer: Self-pay | Admitting: Cardiology

## 2014-08-01 ENCOUNTER — Ambulatory Visit (INDEPENDENT_AMBULATORY_CARE_PROVIDER_SITE_OTHER): Payer: PRIVATE HEALTH INSURANCE | Admitting: Cardiology

## 2014-08-01 VITALS — BP 145/78 | HR 86 | Ht 67.0 in | Wt 179.4 lb

## 2014-08-01 DIAGNOSIS — R0602 Shortness of breath: Secondary | ICD-10-CM

## 2014-08-01 DIAGNOSIS — I428 Other cardiomyopathies: Secondary | ICD-10-CM

## 2014-08-01 DIAGNOSIS — I429 Cardiomyopathy, unspecified: Secondary | ICD-10-CM

## 2014-08-01 MED ORDER — FUROSEMIDE 80 MG PO TABS: 80.0000 mg | ORAL_TABLET | Freq: Two times a day (BID) | ORAL | Status: DC

## 2014-08-01 NOTE — Patient Instructions (Addendum)
Your physician recommends that you schedule a follow-up appointment in: one week with a Extender  We have labs for you to get done  Have your chest xray done as soon as possible    Increase your lasix to 80 mg two times a day

## 2014-08-01 NOTE — Progress Notes (Signed)
HPI  The patient presents for evaluation of dyspnea.  She has a history of HTN, DMT2, Asthma, Churg-Strauss syndrome, AFIB, PE and CHF with EF of 20-25% in 2013.  Her EF was 40% in August of this year when she was in the hospital with CHF.   She presents now for followup. She's had an increase in her weight. She's had increased lower extremity swelling. More recently she's had some increased shortness of breath with cough with blood-tinged sputum. She's not having any documented fevers or chills. She's not having any palpitations, presyncope or syncope. He does have PND. She  Doesn't seem to comply with significant salt restriction though she doesn't add. She keeps her feet up sometimes.   No Known Allergies  Current Outpatient Prescriptions  Medication Sig Dispense Refill  . ADVAIR DISKUS 250-50 MCG/DOSE AEPB   11  . albuterol (PROVENTIL HFA;VENTOLIN HFA) 108 (90 BASE) MCG/ACT inhaler Inhale 1-2 puffs into the lungs every 6 (six) hours as needed for shortness of breath. 18 g 11  . brimonidine (ALPHAGAN P) 0.1 % SOLN Place 1 drop into the left eye every 12 (twelve) hours. 15 mL 1  . dorzolamide (TRUSOPT) 2 % ophthalmic solution Place 1 drop into the left eye 2 (two) times daily.     . furosemide (LASIX) 40 MG tablet Take 1 tablet (40 mg total) by mouth 2 (two) times daily. 60 tablet 1  . lisinopril (PRINIVIL,ZESTRIL) 5 MG tablet Take 1 tablet (5 mg total) by mouth daily. 30 tablet 1  . metFORMIN (GLUCOPHAGE) 1000 MG tablet TAKE 1 TABLET BY MOUTH TWICE DAILY WITH A MEAL 60 tablet 0  . metoprolol succinate (TOPROL-XL) 100 MG 24 hr tablet Take 1 tablet (100 mg total) by mouth daily. Take with or immediately following a meal. 30 tablet 3  . mometasone-formoterol (DULERA) 100-5 MCG/ACT AERO Inhale 2 puffs into the lungs 2 (two) times daily. 8.8 g 3  . potassium chloride SA (K-DUR,KLOR-CON) 20 MEQ tablet TAKE 2 TABLETS BY MOUTH DAILY 60 tablet 0  . XARELTO 15 MG TABS tablet TAKE 1 TABLET BY MOUTH  DAILY WITH SUPPER 30 tablet 0   No current facility-administered medications for this visit.    Past Medical History  Diagnosis Date  . Hypertension   . Diabetes mellitus   . Asthma   . Osteoarthritis     left shoulder  . Pseudogout   . Hypokalemia     a. Previously felt due to diuretics, req supplementation.  . Churg-Strauss syndrome     a. Sural nerve biopsy 02/2000.  Darene Lamer. Grave's disease     a. s/p thyroidectomy.  . Glaucoma   . Hypercholesteremia   . Venous insufficiency   . Osteopenia     DEXA 2004  . Lipoma     left inner thigh  . Cataract     left eye  . VASCULITIS   . Chronic combined systolic and diastolic heart failure     a. 1/61097/2013 EF:20-25%  . Pleural effusion, left     a. Thoracentesis 2001 - per notes, no malignant cells, was transudative.  Marland Kitchen. NSVT (nonsustained ventricular tachycardia)     a. During CHF adm 2001.  . Multifocal atrial tachycardia     a. Documented as OP 01/2012.  Marland Kitchen. Ectopic atrial tachycardia     a. Documented on tele as IP 02/2012.  . Pulmonary HTN     a. Mod by echo 02/2012.  . Valvular heart disease  a. Echo 02/2012: mod MR/TR, mild AI.  Marland Kitchen Pericardial effusion     a. Echo 02/2012: small-mod pericardial effusion.  . Atrial fibrillation, permanent     a. Dx 04/2013, on metoprolol, dig and xarelto  . Shortness of breath     Past Surgical History  Procedure Laterality Date  . Abdominal hysterectomy    . Salpingoophorectomy    . Thyroidectomy    . Hemiarthroplasty hip Right 10/2010    cemented for hip fracture, femoral neck - by Eulas Post, MD  . Cataract extraction Right   . Hemiarthroplasty hip      ROS:  As stated in the HPI and negative for all other systems.   PHYSICAL EXAM BP 145/78 mmHg  Pulse 86  Ht 5\' 7"  (1.702 m)  Wt 179 lb 6.4 oz (81.375 kg)  BMI 28.09 kg/m2 PHYSICAL EXAM GEN:  Chronically ill appearing but no distress NECK:  Positive jugular venous distention at 90 degrees 10 cm, waveform within normal  limits, carotid upstroke brisk and symmetric, no bruits, no thyromegaly LYMPHATICS:  No cervical adenopathy LUNGS:  Decreased breath sounds at both bases.   BACK:  No CVA tenderness CHEST:  Unremarkable HEART:  S1 and S2 within normal limits, no S3, no clicks, no rubs, apical systolic murmur radiating slightly out the outflow tract, no diastolic murmurs, irregular ABD:  Positive bowel sounds normal in frequency in pitch, no bruits, no rebound, no guarding, unable to assess midline mass or bruit with the patient seated. EXT:  2 plus pulses throughout, moderately severe edema, no cyanosis no clubbing to mid thigh. SKIN:  No rashes no nodules NEURO:  Cranial nerves II through XII grossly intact, motor grossly intact throughout PSYCH:  Cognitively intact, oriented to person place and time  EKG:   Atrial fibrillation, rate 78 , left  Axis deviation, old anteroseptal infarct, no acute ST-T wave changes.  08/01/2014   ASSESSMENT AND PLAN  ACUTE ON CHRONIC SYSTOLIC AND DIASTOLIC HF:   I think she has acute on chronic symptoms. She's not compliant with salt and keeping her feet elevated.   I'm going to increase her Lasix to 80 mg twice a day. She will get a basic metabolic profile and BNP today. I will also get a chest x-ray. She will have a basic metabolic profile in one week. She'll need to be seen again in about one week  ATRIAL FIB:  Her dose of Xarelto has been slightly in flux.  She is now on 15 but was on 20 previously.  I will follow up the creat and consider a higher dose if she remains at the current creat clearance

## 2014-08-01 NOTE — Addendum Note (Signed)
Addended by: Vincenza Hews on: 08/01/2014 05:32 PM   Modules accepted: Orders

## 2014-08-02 LAB — BASIC METABOLIC PANEL
BUN: 12 mg/dL (ref 6–23)
CHLORIDE: 101 meq/L (ref 96–112)
CO2: 27 meq/L (ref 19–32)
CREATININE: 0.77 mg/dL (ref 0.50–1.10)
Calcium: 8.6 mg/dL (ref 8.4–10.5)
GLUCOSE: 136 mg/dL — AB (ref 70–99)
POTASSIUM: 4.1 meq/L (ref 3.5–5.3)
Sodium: 138 mEq/L (ref 135–145)

## 2014-08-02 LAB — BRAIN NATRIURETIC PEPTIDE: Brain Natriuretic Peptide: 1069.1 pg/mL — ABNORMAL HIGH (ref 0.0–100.0)

## 2014-08-05 ENCOUNTER — Other Ambulatory Visit: Payer: Self-pay | Admitting: Internal Medicine

## 2014-08-08 ENCOUNTER — Other Ambulatory Visit: Payer: Self-pay | Admitting: Internal Medicine

## 2014-08-08 ENCOUNTER — Ambulatory Visit: Payer: PRIVATE HEALTH INSURANCE | Admitting: Cardiology

## 2014-08-08 ENCOUNTER — Other Ambulatory Visit: Payer: Self-pay | Admitting: *Deleted

## 2014-08-08 DIAGNOSIS — R0602 Shortness of breath: Secondary | ICD-10-CM

## 2014-08-14 ENCOUNTER — Other Ambulatory Visit: Payer: Self-pay | Admitting: *Deleted

## 2014-08-14 DIAGNOSIS — R0602 Shortness of breath: Secondary | ICD-10-CM

## 2014-08-24 ENCOUNTER — Other Ambulatory Visit: Payer: Self-pay | Admitting: Internal Medicine

## 2014-09-01 ENCOUNTER — Ambulatory Visit: Payer: Medicaid Other | Admitting: Internal Medicine

## 2014-09-05 ENCOUNTER — Ambulatory Visit (INDEPENDENT_AMBULATORY_CARE_PROVIDER_SITE_OTHER): Payer: Medicare Other | Admitting: Internal Medicine

## 2014-09-05 ENCOUNTER — Telehealth: Payer: Self-pay | Admitting: Licensed Clinical Social Worker

## 2014-09-05 ENCOUNTER — Encounter: Payer: Self-pay | Admitting: Internal Medicine

## 2014-09-05 ENCOUNTER — Ambulatory Visit (HOSPITAL_COMMUNITY)
Admission: RE | Admit: 2014-09-05 | Discharge: 2014-09-05 | Disposition: A | Discharge status: Home / Self Care | Payer: Medicare Other | Source: Ambulatory Visit | Attending: Cardiology | Admitting: Cardiology

## 2014-09-05 VITALS — BP 135/77 | HR 84 | Temp 99.1°F | Ht 67.0 in | Wt 188.7 lb

## 2014-09-05 DIAGNOSIS — I517 Cardiomegaly: Secondary | ICD-10-CM | POA: Diagnosis not present

## 2014-09-05 DIAGNOSIS — J45909 Unspecified asthma, uncomplicated: Secondary | ICD-10-CM

## 2014-09-05 DIAGNOSIS — R21 Rash and other nonspecific skin eruption: Secondary | ICD-10-CM

## 2014-09-05 DIAGNOSIS — I5042 Chronic combined systolic (congestive) and diastolic (congestive) heart failure: Secondary | ICD-10-CM

## 2014-09-05 DIAGNOSIS — I10 Essential (primary) hypertension: Secondary | ICD-10-CM

## 2014-09-05 DIAGNOSIS — Z9981 Dependence on supplemental oxygen: Secondary | ICD-10-CM

## 2014-09-05 DIAGNOSIS — R0602 Shortness of breath: Secondary | ICD-10-CM | POA: Insufficient documentation

## 2014-09-05 DIAGNOSIS — E119 Type 2 diabetes mellitus without complications: Secondary | ICD-10-CM

## 2014-09-05 LAB — BASIC METABOLIC PANEL WITH GFR
BUN: 14 mg/dL (ref 6–23)
CO2: 31 meq/L (ref 19–32)
Calcium: 8.4 mg/dL (ref 8.4–10.5)
Chloride: 101 mEq/L (ref 96–112)
Creat: 0.78 mg/dL (ref 0.50–1.10)
GFR, Est African American: 80 mL/min
GFR, Est Non African American: 69 mL/min
GLUCOSE: 162 mg/dL — AB (ref 70–99)
Potassium: 3.7 mEq/L (ref 3.5–5.3)
SODIUM: 141 meq/L (ref 135–145)

## 2014-09-05 LAB — GLUCOSE, CAPILLARY: Glucose-Capillary: 154 mg/dL — ABNORMAL HIGH (ref 70–99)

## 2014-09-05 LAB — POCT GLYCOSYLATED HEMOGLOBIN (HGB A1C): Hemoglobin A1C: 7.6

## 2014-09-05 LAB — BRAIN NATRIURETIC PEPTIDE: B NATRIURETIC PEPTIDE 5: 779.3 pg/mL — AB (ref 0.0–100.0)

## 2014-09-05 MED ORDER — PREDNISONE 20 MG PO TABS: 20.0000 mg | ORAL_TABLET | Freq: Every day | ORAL | Status: AC

## 2014-09-05 MED ORDER — FUROSEMIDE 80 MG PO TABS: 80.0000 mg | ORAL_TABLET | Freq: Two times a day (BID) | ORAL | Status: DC

## 2014-09-05 MED ORDER — ALBUTEROL SULFATE (2.5 MG/3ML) 0.083% IN NEBU
2.5000 mg | INHALATION_SOLUTION | Freq: Once | RESPIRATORY_TRACT | Status: AC
  Administered 2014-09-05: 2.5 mg via RESPIRATORY_TRACT

## 2014-09-05 NOTE — Assessment & Plan Note (Signed)
Lab Results  Component Value Date   HGBA1C 7.6 09/05/2014   HGBA1C 7.9* 04/05/2014   HGBA1C 6.7 11/24/2013     Assessment: Diabetes control: good control (HgbA1C at goal) Progress toward A1C goal:  at goal Comments: Goal of 8 with age of 65.  Plan: Medications:  continue current medications: metformin 1000 mg BID. Home glucose monitoring: Frequency: once a day Timing: before breakfast Instruction/counseling given: reminded to bring blood glucose meter & log to each visit, reminded to bring medications to each visit and discussed diet

## 2014-09-05 NOTE — Assessment & Plan Note (Signed)
BP Readings from Last 3 Encounters:  09/05/14 135/77  08/01/14 145/78  05/24/14 155/91    Lab Results  Component Value Date   NA 138 08/01/2014   K 4.1 08/01/2014   CREATININE 0.77 08/01/2014    Assessment: Blood pressure control: controlled Progress toward BP goal:  at goal  Plan: Medications:  continue current medications: lisinopril 5 mg daily, metoprolol 100 mg daily.

## 2014-09-05 NOTE — Patient Instructions (Addendum)
Thank you for coming to clinic today [name].  General instructions: -Make sure to keep your appointment with the heart failure clinic. -Increase your Lasix to 120 mg in the morning and 80 mg at night. -I also sent a prescription for three days of prednisone to help with your asthma. -Elevated your legs when resting and use your compression stockings to help with your leg swelling. -Try to cut back on the amount of water that you drink. -Use you oxygen when you are walking around the house and when you sleep at night. -I reordered the home health physical therapy.  Let us know if they don't come out to see you. -Please make a follow up appointment to return to clinic at the end of this week or early next week to make sure your leg swelling and breathing is improving.  Please bring your medicines with you each time you come.   Medicines may be  Eye drops  Herbal   Vitamins  Pills  Seeing these help [place] take care of you.

## 2014-09-05 NOTE — Assessment & Plan Note (Addendum)
Weight is up significantly from prior and her saturations are running lower.  She does typically use oxygen at night.  Wheezing on exam could be asthma or cardiac wheezing.  Albuterol treatment helped suggesting this could be related to her asthma.  Lower extremity edema has improved over past couple of weeks, suggesting higher dose of Lasix is working.  She may need to go up further.  Home health PT was ordered at hospital discharge, but she never had anyone come out to see her. -Check BNP and chest x-ray to distinguish between asthma and CHF. -Check BMP after increasing Lasix dose. -Called heart failure clinic.  Scheduled earliest appointment available on 09/25/14. -Will have her follow up closely in our clinic until she can get in to see heart failure. -Advised patient to elevate her legs at rest and use oxygen when ambulating. -Replaced order for home health PT.  Sent a message to [place] and [place] to follow up.  -- Chest x-ray with some increasing edema on my read. -Increased Lasix to 120 mg in AM, and 80 mg in PM. -Schedule appointment later this week to follow up.  --Addendum-- Luisa Dago, MD, PhD Internal Medicine Intern Pager: [phone] 09/05/2014,3:41 PM  Final chest x-ray read consistent with mild CHF.  BNP trending down. -Continue plan as above.

## 2014-09-05 NOTE — Assessment & Plan Note (Addendum)
Reports asthma triggered by cold (19 degrees outside today), which is likely contributing to her low sats.  Will make sure she doesn't have any signs of fluid on chest x-ray and with BNP. -Will give prednisone 20 mg for 3 days to help with asthma flare triggered by cold weather. -Continue Dulera BID, and albuterol inhaler PRN.

## 2014-09-05 NOTE — Telephone Encounter (Signed)
[name] was referred to CSW for home health PT.  CSW placed called to pt.  CSW left message requesting return call. CSW provided contact hours and phone number.

## 2014-09-05 NOTE — Progress Notes (Signed)
   Subjective:    Patient ID: [name], female    DOB: Jan 07, 1926, 81 y.o.   MRN: [id number]  HPI [name] is a 81 year old woman with history of atrial fibrillation and PE on Xarelto, HTN, combined HF, DM2, asthma and glaucoma presenting with leg swelling.  She reports that the swelling in her legs has gotten worse since her last visit.  They were at their worst 2 or 3 weeks ago, but have improved slightly since then.  She reports that she has some shortness of breath that is similar to at her previous visit, worse with exertion.  She endorses some orthopnea and PND, and she sleeps on her right side on two pillows.  She thinks she has gained some weight.  She denies dizziness, fever, or chills.  She reports occasional chest tightness, last 2 or 3 days ago.  She has had a cough for the last couple of days when she eats peanuts, and she has been coughing up some clear mucus.  She endorses a sore throat, but denies congestion or rhinorrhea.  She also says her asthma was triggered coming into clinic this morning by the cold air.  She has been taking Lasix 80 mg BID, but she reports drinking a lot of water.  She reports her blood sugars have been running in the low 100s typically.  Review of Systems  Constitutional: Negative for fever, chills, diaphoresis and fatigue.  HENT: Positive for sore throat. Negative for congestion and rhinorrhea.   Respiratory: Positive for cough, chest tightness and shortness of breath. Negative for wheezing.   Cardiovascular: Positive for leg swelling. Negative for chest pain.  Gastrointestinal: Negative for nausea, vomiting, abdominal pain, diarrhea and constipation.  Genitourinary: Negative for dysuria and difficulty urinating.  Musculoskeletal: Negative for myalgias, back pain and arthralgias.  Skin: Negative for rash.  Neurological: Negative for dizziness, weakness, light-headedness and numbness.       Objective:   Physical Exam  Constitutional: She is  oriented to person, place, and time. She appears well-developed and well-nourished. No distress.  HENT:  Head: Normocephalic and atraumatic.  Mouth/Throat: No oropharyngeal exudate.  Eyes: Conjunctivae and EOM are normal. Pupils are equal, round, and reactive to light. No scleral icterus.  Cardiovascular: Normal rate and normal heart sounds.   Irregularly irregular.  Pulmonary/Chest: Effort normal. No respiratory distress. She has wheezes (Diffuse expriatory.).  Abdominal: Soft. Bowel sounds are normal. She exhibits no distension. There is no tenderness.  Musculoskeletal: Normal range of motion. She exhibits edema (3+ pitting edema to thighs bilaterally.). She exhibits no tenderness.  Neurological: She is alert and oriented to person, place, and time. No cranial nerve deficit. She exhibits normal muscle tone.  Skin: Skin is warm and dry. Rash (Chronic stasis changes of lower extremities.) noted.          Assessment & Plan:  Please see problem-based assessment and plan.

## 2014-09-06 NOTE — Telephone Encounter (Signed)
CSW received voice mail  from Mr. Gari Crown.  CSW returned call to obtain agency of choice for [facility] services.  Son states Ms. Swartzbaugh has had HH in the past but unsure of agency.  CSW inquired if referral to [facility] would be acceptable.  Son agreeable, stating that may have been the agency used in the past.  In addition to home health services, CSW inquired if  Ms. Burgert was receiving any additional services in the home.  Son denied.  Discussed PCS benefit under pt's Medicaid.  Son states pt had in the past and does need aide service now.  CSW will initiate PCS request and send [facility] referral to [facility].  Son notified of time frame, phone number confirmed.

## 2014-09-07 NOTE — Progress Notes (Signed)
Internal Medicine Clinic Attending  I saw and evaluated the patient.  I personally confirmed the key portions of the history and exam documented by Dr. Moding and I reviewed pertinent patient test results.  The assessment, diagnosis, and plan were formulated together and I agree with the documentation in the resident's note. 

## 2014-09-10 ENCOUNTER — Other Ambulatory Visit: Payer: Self-pay | Admitting: Internal Medicine

## 2014-09-11 ENCOUNTER — Ambulatory Visit: Payer: Medicare Other | Admitting: Internal Medicine

## 2014-09-11 ENCOUNTER — Encounter: Payer: Self-pay | Admitting: Internal Medicine

## 2014-09-19 ENCOUNTER — Telehealth: Payer: Self-pay | Admitting: Internal Medicine

## 2014-09-19 NOTE — Telephone Encounter (Signed)
Call to patient to confirm appointment for 09/20/14 at 2:45. LMTCB

## 2014-09-20 ENCOUNTER — Observation Stay (HOSPITAL_COMMUNITY): Payer: Medicare Other

## 2014-09-20 ENCOUNTER — Encounter (HOSPITAL_COMMUNITY): Payer: Self-pay | Admitting: General Practice

## 2014-09-20 ENCOUNTER — Ambulatory Visit (INDEPENDENT_AMBULATORY_CARE_PROVIDER_SITE_OTHER): Payer: Medicare Other | Admitting: Internal Medicine

## 2014-09-20 ENCOUNTER — Inpatient Hospital Stay (HOSPITAL_COMMUNITY)
Admission: RE | Admit: 2014-09-20 | Discharge: 2014-10-02 | DRG: 291 | Disposition: A | Discharge status: Home Health | Payer: Medicare Other | Source: Ambulatory Visit | Attending: Internal Medicine | Admitting: Internal Medicine

## 2014-09-20 ENCOUNTER — Ambulatory Visit (HOSPITAL_COMMUNITY)
Admission: RE | Admit: 2014-09-20 | Discharge: 2014-09-20 | Disposition: A | Discharge status: Home / Self Care | Payer: Medicare Other | Source: Ambulatory Visit | Admitting: Internal Medicine

## 2014-09-20 ENCOUNTER — Telehealth: Payer: Self-pay | Admitting: *Deleted

## 2014-09-20 ENCOUNTER — Encounter: Payer: Self-pay | Admitting: Internal Medicine

## 2014-09-20 VITALS — BP 128/74 | HR 93 | Temp 97.6°F | Ht 67.0 in | Wt 190.8 lb

## 2014-09-20 DIAGNOSIS — I5043 Acute on chronic combined systolic (congestive) and diastolic (congestive) heart failure: Secondary | ICD-10-CM

## 2014-09-20 DIAGNOSIS — E876 Hypokalemia: Secondary | ICD-10-CM | POA: Diagnosis present

## 2014-09-20 DIAGNOSIS — T462X5A Adverse effect of other antidysrhythmic drugs, initial encounter: Secondary | ICD-10-CM | POA: Diagnosis not present

## 2014-09-20 DIAGNOSIS — J9621 Acute and chronic respiratory failure with hypoxia: Secondary | ICD-10-CM | POA: Diagnosis present

## 2014-09-20 DIAGNOSIS — D72819 Decreased white blood cell count, unspecified: Secondary | ICD-10-CM | POA: Diagnosis present

## 2014-09-20 DIAGNOSIS — R042 Hemoptysis: Secondary | ICD-10-CM

## 2014-09-20 DIAGNOSIS — Z86711 Personal history of pulmonary embolism: Secondary | ICD-10-CM

## 2014-09-20 DIAGNOSIS — I482 Chronic atrial fibrillation: Secondary | ICD-10-CM | POA: Diagnosis present

## 2014-09-20 DIAGNOSIS — Z96649 Presence of unspecified artificial hip joint: Secondary | ICD-10-CM | POA: Diagnosis present

## 2014-09-20 DIAGNOSIS — Z7901 Long term (current) use of anticoagulants: Secondary | ICD-10-CM

## 2014-09-20 DIAGNOSIS — I2699 Other pulmonary embolism without acute cor pulmonale: Secondary | ICD-10-CM

## 2014-09-20 DIAGNOSIS — E119 Type 2 diabetes mellitus without complications: Secondary | ICD-10-CM

## 2014-09-20 DIAGNOSIS — H409 Unspecified glaucoma: Secondary | ICD-10-CM | POA: Diagnosis present

## 2014-09-20 DIAGNOSIS — Z9111 Patient's noncompliance with dietary regimen: Secondary | ICD-10-CM | POA: Diagnosis present

## 2014-09-20 DIAGNOSIS — I272 Other secondary pulmonary hypertension: Secondary | ICD-10-CM | POA: Diagnosis present

## 2014-09-20 DIAGNOSIS — I509 Heart failure, unspecified: Secondary | ICD-10-CM

## 2014-09-20 DIAGNOSIS — E890 Postprocedural hypothyroidism: Secondary | ICD-10-CM | POA: Diagnosis present

## 2014-09-20 DIAGNOSIS — R0902 Hypoxemia: Secondary | ICD-10-CM | POA: Insufficient documentation

## 2014-09-20 DIAGNOSIS — E780 Pure hypercholesterolemia: Secondary | ICD-10-CM | POA: Diagnosis present

## 2014-09-20 DIAGNOSIS — D649 Anemia, unspecified: Secondary | ICD-10-CM | POA: Diagnosis present

## 2014-09-20 DIAGNOSIS — Z9981 Dependence on supplemental oxygen: Secondary | ICD-10-CM

## 2014-09-20 DIAGNOSIS — I5042 Chronic combined systolic (congestive) and diastolic (congestive) heart failure: Secondary | ICD-10-CM | POA: Diagnosis present

## 2014-09-20 DIAGNOSIS — I10 Essential (primary) hypertension: Secondary | ICD-10-CM | POA: Diagnosis present

## 2014-09-20 DIAGNOSIS — J45909 Unspecified asthma, uncomplicated: Secondary | ICD-10-CM | POA: Diagnosis present

## 2014-09-20 HISTORY — DX: Dependence on supplemental oxygen: Z99.81

## 2014-09-20 HISTORY — DX: Type 2 diabetes mellitus without complications: E11.9

## 2014-09-20 HISTORY — DX: Pneumonia, unspecified organism: J18.9

## 2014-09-20 LAB — COMPREHENSIVE METABOLIC PANEL
ALBUMIN: 3.7 g/dL (ref 3.5–5.2)
ALK PHOS: 110 U/L (ref 39–117)
ALT: 7 U/L (ref 0–35)
AST: 20 U/L (ref 0–37)
Anion gap: 9 (ref 5–15)
BILIRUBIN TOTAL: 0.9 mg/dL (ref 0.3–1.2)
BUN: 14 mg/dL (ref 6–23)
CALCIUM: 8 mg/dL — AB (ref 8.4–10.5)
CO2: 28 mmol/L (ref 19–32)
CREATININE: 0.91 mg/dL (ref 0.50–1.10)
Chloride: 100 mmol/L (ref 96–112)
GFR, EST AFRICAN AMERICAN: 64 mL/min — AB (ref >90)
GFR, EST NON AFRICAN AMERICAN: 56 mL/min — AB (ref >90)
GLUCOSE: 132 mg/dL — AB (ref 70–99)
Potassium: 4 mmol/L (ref 3.5–5.1)
SODIUM: 137 mmol/L (ref 135–145)
TOTAL PROTEIN: 6.8 g/dL (ref 6.0–8.3)

## 2014-09-20 LAB — CBC
HCT: 35.8 % — ABNORMAL LOW (ref 36.0–46.0)
Hemoglobin: 10.9 g/dL — ABNORMAL LOW (ref 12.0–15.0)
MCH: 27.3 pg (ref 26.0–34.0)
MCHC: 30.4 g/dL (ref 30.0–36.0)
MCV: 89.7 fL (ref 78.0–100.0)
Platelets: 230 10*3/uL (ref 150–400)
RBC: 3.99 MIL/uL (ref 3.87–5.11)
RDW: 16.3 % — ABNORMAL HIGH (ref 11.5–15.5)
WBC: 1.8 10*3/uL — AB (ref 4.0–10.5)

## 2014-09-20 LAB — DIFFERENTIAL
BASOS ABS: 0 10*3/uL (ref 0.0–0.1)
Basophils Relative: 1 % (ref 0–1)
Eosinophils Absolute: 0.1 10*3/uL (ref 0.0–0.7)
Eosinophils Relative: 3 % (ref 0–5)
LYMPHS ABS: 0.5 10*3/uL — AB (ref 0.7–4.0)
Lymphocytes Relative: 29 % (ref 12–46)
MONOS PCT: 16 % — AB (ref 3–12)
Monocytes Absolute: 0.3 10*3/uL (ref 0.1–1.0)
Neutro Abs: 0.9 10*3/uL — ABNORMAL LOW (ref 1.7–7.7)
Neutrophils Relative %: 51 % (ref 43–77)

## 2014-09-20 LAB — GLUCOSE, CAPILLARY
Glucose-Capillary: 118 mg/dL — ABNORMAL HIGH (ref 70–99)
Glucose-Capillary: 161 mg/dL — ABNORMAL HIGH (ref 70–99)

## 2014-09-20 LAB — BRAIN NATRIURETIC PEPTIDE: B Natriuretic Peptide: 549.9 pg/mL — ABNORMAL HIGH (ref 0.0–100.0)

## 2014-09-20 MED ORDER — ALBUTEROL SULFATE (2.5 MG/3ML) 0.083% IN NEBU
INHALATION_SOLUTION | RESPIRATORY_TRACT | Status: DC
Start: 2014-09-20 — End: 2014-09-20
  Filled 2014-09-20: qty 3

## 2014-09-20 MED ORDER — FUROSEMIDE 10 MG/ML IJ SOLN
80.0000 mg | Freq: Once | INTRAMUSCULAR | Status: AC
  Administered 2014-09-21: 80 mg via INTRAVENOUS
  Filled 2014-09-20: qty 8

## 2014-09-20 MED ORDER — BRIMONIDINE TARTRATE 0.2 % OP SOLN
1.0000 [drp] | Freq: Two times a day (BID) | OPHTHALMIC | Status: DC
  Administered 2014-09-20 – 2014-10-02 (×24): 1 [drp] via OPHTHALMIC
  Filled 2014-09-20 (×2): qty 5

## 2014-09-20 MED ORDER — MOMETASONE FURO-FORMOTEROL FUM 100-5 MCG/ACT IN AERO
2.0000 | INHALATION_SPRAY | Freq: Two times a day (BID) | RESPIRATORY_TRACT | Status: DC
  Administered 2014-09-20 – 2014-10-02 (×23): 2 via RESPIRATORY_TRACT
  Filled 2014-09-20: qty 8.8

## 2014-09-20 MED ORDER — ACETAMINOPHEN 325 MG PO TABS
650.0000 mg | ORAL_TABLET | Freq: Four times a day (QID) | ORAL | Status: DC | PRN
  Administered 2014-09-21 (×2): 650 mg via ORAL
  Filled 2014-09-20 (×2): qty 2

## 2014-09-20 MED ORDER — SODIUM CHLORIDE 0.9 % IJ SOLN
3.0000 mL | Freq: Two times a day (BID) | INTRAMUSCULAR | Status: DC
  Administered 2014-09-21 – 2014-10-02 (×18): 3 mL via INTRAVENOUS

## 2014-09-20 MED ORDER — INSULIN ASPART 100 UNIT/ML ~~LOC~~ SOLN
0.0000 [IU] | Freq: Three times a day (TID) | SUBCUTANEOUS | Status: DC
  Administered 2014-09-21: 1 [IU] via SUBCUTANEOUS
  Administered 2014-09-21: 2 [IU] via SUBCUTANEOUS
  Administered 2014-09-22 (×2): 1 [IU] via SUBCUTANEOUS
  Administered 2014-09-22: 2 [IU] via SUBCUTANEOUS
  Administered 2014-09-23: 1 [IU] via SUBCUTANEOUS
  Administered 2014-09-23 – 2014-09-24 (×2): 2 [IU] via SUBCUTANEOUS
  Administered 2014-09-24 – 2014-09-25 (×2): 3 [IU] via SUBCUTANEOUS
  Administered 2014-09-25 – 2014-09-26 (×3): 2 [IU] via SUBCUTANEOUS
  Administered 2014-09-27: 1 [IU] via SUBCUTANEOUS
  Administered 2014-09-27: 2 [IU] via SUBCUTANEOUS
  Administered 2014-09-28 – 2014-09-29 (×2): 3 [IU] via SUBCUTANEOUS
  Administered 2014-09-29 – 2014-09-30 (×3): 2 [IU] via SUBCUTANEOUS
  Administered 2014-09-30: 1 [IU] via SUBCUTANEOUS
  Administered 2014-09-30: 5 [IU] via SUBCUTANEOUS
  Administered 2014-10-01: 2 [IU] via SUBCUTANEOUS
  Administered 2014-10-01: 3 [IU] via SUBCUTANEOUS
  Administered 2014-10-01 – 2014-10-02 (×2): 1 [IU] via SUBCUTANEOUS
  Administered 2014-10-02: 2 [IU] via SUBCUTANEOUS

## 2014-09-20 MED ORDER — LISINOPRIL 5 MG PO TABS
5.0000 mg | ORAL_TABLET | Freq: Every day | ORAL | Status: DC
Start: 2014-09-21 — End: 2014-10-02
  Administered 2014-09-21 – 2014-10-02 (×12): 5 mg via ORAL
  Filled 2014-09-20 (×12): qty 1

## 2014-09-20 MED ORDER — BRIMONIDINE TARTRATE 0.15 % OP SOLN
1.0000 [drp] | Freq: Two times a day (BID) | OPHTHALMIC | Status: DC
  Filled 2014-09-20: qty 5

## 2014-09-20 MED ORDER — ALBUTEROL SULFATE (2.5 MG/3ML) 0.083% IN NEBU: 2.5000 mg | INHALATION_SOLUTION | Freq: Four times a day (QID) | RESPIRATORY_TRACT | Status: DC | PRN

## 2014-09-20 MED ORDER — RIVAROXABAN 20 MG PO TABS
20.0000 mg | ORAL_TABLET | Freq: Every day | ORAL | Status: DC
  Administered 2014-09-21 – 2014-10-01 (×11): 20 mg via ORAL
  Filled 2014-09-20 (×12): qty 1

## 2014-09-20 MED ORDER — ACETAMINOPHEN 650 MG RE SUPP: 650.0000 mg | Freq: Four times a day (QID) | RECTAL | Status: DC | PRN

## 2014-09-20 MED ORDER — METOPROLOL SUCCINATE ER 100 MG PO TB24
100.0000 mg | ORAL_TABLET | Freq: Every day | ORAL | Status: DC
  Administered 2014-09-21 – 2014-10-02 (×12): 100 mg via ORAL
  Filled 2014-09-20 (×12): qty 1

## 2014-09-20 MED ORDER — ALBUTEROL SULFATE HFA 108 (90 BASE) MCG/ACT IN AERS: 1.0000 | INHALATION_SPRAY | Freq: Four times a day (QID) | RESPIRATORY_TRACT | Status: DC | PRN

## 2014-09-20 MED ORDER — DORZOLAMIDE HCL 2 % OP SOLN
1.0000 [drp] | Freq: Two times a day (BID) | OPHTHALMIC | Status: DC
  Administered 2014-09-21 – 2014-10-02 (×23): 1 [drp] via OPHTHALMIC
  Filled 2014-09-20: qty 10

## 2014-09-20 MED ORDER — FUROSEMIDE 10 MG/ML IJ SOLN
40.0000 mg | Freq: Once | INTRAMUSCULAR | Status: AC
  Administered 2014-09-20: 40 mg via INTRAVENOUS

## 2014-09-20 MED ORDER — SODIUM CHLORIDE 0.9 % IJ SOLN
3.0000 mL | Freq: Two times a day (BID) | INTRAMUSCULAR | Status: DC
  Administered 2014-09-21 – 2014-10-02 (×9): 3 mL via INTRAVENOUS

## 2014-09-20 MED ORDER — ALBUTEROL SULFATE (2.5 MG/3ML) 0.083% IN NEBU
2.5000 mg | INHALATION_SOLUTION | Freq: Once | RESPIRATORY_TRACT | Status: AC
  Administered 2014-09-20: 2.5 mg via RESPIRATORY_TRACT
  Filled 2014-09-20: qty 3

## 2014-09-20 NOTE — Progress Notes (Signed)
ANTICOAGULATION CONSULT NOTE - Initial Consult  Pharmacy Consult for Xarelto Indication: atrial fibrillation and h/o PE (08/2013) while on Xarelto  No Known Allergies  Patient Measurements: Height:  (165.1 cm) Weight: 188 lb 15 oz (85.7 kg) IBW/kg (Calculated) : 57  Vital Signs: Temp: 97.4 F (36.3 C) (02/03 1755) Temp Source: Oral (02/03 1755) BP: 140/87 mmHg (02/03 1755) Pulse Rate: 56 (02/03 1755)  Labs:  Recent Labs  09/20/14 1654  HGB 10.9*  HCT 35.8*  PLT 230  CREATININE 0.91    Estimated Creatinine Clearance: 48 mL/min (by C-G formula based on Cr of 0.91).   Medical History: Past Medical History  Diagnosis Date  . Hypertension   . Diabetes mellitus   . Asthma   . Osteoarthritis     left shoulder  . Pseudogout   . Hypokalemia     a. Previously felt due to diuretics, req supplementation.  . Churg-Strauss syndrome     a. Sural nerve biopsy 02/2000.  Darene Lamer disease     a. s/p thyroidectomy.  . Glaucoma   . Hypercholesteremia   . Venous insufficiency   . Osteopenia     DEXA 2004  . Lipoma     left inner thigh  . Cataract     left eye  . VASCULITIS   . Chronic combined systolic and diastolic heart failure     a. 08/6107 EF:20-25%  . Pleural effusion, left     a. Thoracentesis 2001 - per notes, no malignant cells, was transudative.  Marland Kitchen NSVT (nonsustained ventricular tachycardia)     a. During CHF adm 2001.  . Multifocal atrial tachycardia     a. Documented as OP 01/2012.  Marland Kitchen Ectopic atrial tachycardia     a. Documented on tele as IP 02/2012.  . Pulmonary HTN     a. Mod by echo 02/2012.  . Valvular heart disease     a. Echo 02/2012: mod MR/TR, mild AI.  Marland Kitchen Pericardial effusion     a. Echo 02/2012: small-mod pericardial effusion.  . Atrial fibrillation, permanent     a. Dx 04/2013, on metoprolol, dig and xarelto  . Shortness of breath     Medications:  Prescriptions prior to admission  Medication Sig Dispense Refill Last Dose  . albuterol  (PROVENTIL HFA;VENTOLIN HFA) 108 (90 BASE) MCG/ACT inhaler Inhale 1-2 puffs into the lungs every 6 (six) hours as needed for shortness of breath. 18 g 11 Taking  . brimonidine (ALPHAGAN P) 0.1 % SOLN Place 1 drop into the left eye every 12 (twelve) hours. 15 mL 1 Taking  . dorzolamide (TRUSOPT) 2 % ophthalmic solution Place 1 drop into the left eye 2 (two) times daily.    Taking  . furosemide (LASIX) 80 MG tablet Take 1-1.5 tablets (80-120 mg total) by mouth 2 (two) times daily. Take 1.5 tabs in morning, and 1 tab in the evening. 180 tablet 3   . lisinopril (PRINIVIL,ZESTRIL) 5 MG tablet TAKE 1 TABLET BY MOUTH EVERY DAY 30 tablet 3 Taking  . metFORMIN (GLUCOPHAGE) 1000 MG tablet TAKE 1 TABLET BY MOUTH TWICE DAILY WITH A MEAL 60 tablet 3   . metoprolol succinate (TOPROL-XL) 100 MG 24 hr tablet TAKE 1 TABLET BY MOUTH DAILY WITH OR IMMEDIATELY FOLLOWING A MEAL 30 tablet 3 Taking  . mometasone-formoterol (DULERA) 100-5 MCG/ACT AERO Inhale 2 puffs into the lungs 2 (two) times daily. 8.8 g 3 Taking  . potassium chloride SA (K-DUR,KLOR-CON) 20 MEQ tablet TAKE 2 TABLETS BY MOUTH  DAILY 60 tablet 0   . XARELTO 15 MG TABS tablet TAKE 1 TABLET BY MOUTH DAILY WITH SUPPER 30 tablet 0     Assessment: 81 y.o. female presents with leg pain and swelling. Pt on Xarelto 15mg  daily PTA for afib and h/o PE 08/2013. Noted that pt was on Xarelto 15mg  daily when developed PE. Appears that sometime over the past year, pt was switched to 15mg  daily for afib alone (CrCl 15-50 ml/min). MD would like correct dosing for h/o PE and afib so will change to 20mg  daily (no dose adjustments necessary when using for h/o PE unless CrCl < 30 ml/min. Estimated CrCl 48 ml/min. Pt states that she thinks she took it this morning.  Goal of Therapy:  Prevention of CVA and VTE Monitor platelets by anticoagulation protocol: Yes   Plan:  Xarelto 20mg  daily starting tomorrow (pt reports taking it already today) Will f/u renal function, s/s  bleeding  Christoper Fabian, PharmD, BCPS Clinical pharmacist, pager [phone] 09/20/2014,7:04 PM

## 2014-09-20 NOTE — Progress Notes (Signed)
Case discussed with Dr. Wilson at the time of the visit.  We reviewed the resident's history and exam and pertinent patient test results.  I agree with the assessment, diagnosis, and plan of care documented in the resident's note. 

## 2014-09-20 NOTE — Addendum Note (Signed)
Addended by: Yolanda Manges on: 09/20/2014 07:34 PM   Modules accepted: Level of Service

## 2014-09-20 NOTE — Assessment & Plan Note (Addendum)
SpO2 86% in clinic.  Improved to low 90s with 4L oxygen supplementation.  She has stable 2 pillow orthopnea, DOE and uses 2L oxygen at night only.  She denies wheezing, I heard only one wheeze on exam, she is compliant with Dulera BID and she has not felt the need to use her rescue inhaler making asthma exacerbation less likely.  Patient's main c/o is not dyspnea but leg pain that is keeping her awake at night.  Legs painful x 3 days.  No preceding trauma.  Left leg is more painful than right but both tender to palpation on exam and both equally edematous; furthermore, she is on Xarelto making VTE less likely.   She experienced once episode of atypical CP last week but has not had any since that time.  She was seen by cardiology two months for dyspnea and Lasix was increased to 80mg  BID with plans for close follow-up but she has not yet returned.  She was here last month with leg swelling and has been taking Lasix 120mg  AM and 80mg  PM since then.  She reports compliance with Lasix and reports increased UOP with increased dose.  She does not wear support hose because they are too tight.  She says she keeps her legs elevated.  She reports eating home-cooking as opposed to fast-food, reports compliance with no-salt diet.  Her weight is 190 pounds today and she normally is in the 150s.  The leg discomfort is likely due to edema.  Her presentation seems most c/w volume overload due to HF exacerbation.  Given her hypoxia, poor response to increase po Lasix, age and co-morbidities, she warrants admission for IV diuresis and close monitoring of respiratory status. - admit to inpatient for IV Lasix and observation of respiratory status overnight - telemetry monitoring - EKG, BMP, proBNP, CBC - 2view chest xray - continuous supplemental oxygen; can attempt wean as her oxygen status improves - daily weights, strict I&Os - IV Lasix 40 once and then reassess - albuterol nebulizer prn - continue home bronchodilators -  consider LE venous duplex if leg pain does not decrease with diuresis - she already has follow-up arranged with Dr. Antoine Poche on 09/25/14

## 2014-09-20 NOTE — Progress Notes (Signed)
Pt is a direct admit to 3E13 from Internal Med Teaching Service clinic. Pt is alert and oriented x4, able to follow commands,VSS, no s/s of respiratory distress on 4L [place]. Standard hospital orientation completed, bed in lowest position, call bell is within reach. Admission RN notified of pt's arrival. Report received from [facility]. Will continue to monitor pt closely.

## 2014-09-20 NOTE — H&P (Signed)
Date: 09/20/2014               Patient Name:  [name] MRN: [id number]  DOB: [birth year] Age / Sex: 81 y.o., female   PCP: Yolanda Manges, DO         Medical Service: Internal Medicine Teaching Service         Attending Physician: Dr. Gardiner Barefoot, MD    First Contact: Dr. Valentino Saxon Pager: [phone]  Second Contact: Dr. Andrey Campanile Pager: [phone]       After Hours (After 5p/  First Contact Pager: [phone]  weekends / holidays): Second Contact Pager: [phone]   Chief Complaint: left leg pain and b/l lower leg swelling  History of Present Illness:   81 yo female with PMH Afib, PE (on xarelto 15mg  daily), HTN, combined CHF (EF 40-45%,) DM II, asthma, chronic leg swelling, here with increased leg swelling (1 month) and leg pain (3 days). She was found to be hypoxic to 86% in clinic, improved to 90's with 4L o2. Has 2 pillow orthopnea at baseline, DOE, uses 2L o2 at night only.  Today's weight is 190 lbs, 179 on 07/2014. Has been seen at CHF clinic 12/15 and IM clinic few times. Had lasix dose increased for increased weight/LE swelling and SOB. Has hx of noncompliance with salt and not keeping her feet elevated. She has been compliant with all of her meds. Her daughter gives those to her. Has overall leg pain, around her knees, more on left, no localization of pain to her calves. Has dry cough and baseline SOB. No chest pain. No pain with deep inspiration. No fever/chills. Has been urinating a lot with lasix, no dysuria.     Meds: Current Facility-Administered Medications  Medication Dose Route Frequency Provider Last Rate Last Dose  . acetaminophen (TYLENOL) tablet 650 mg  650 mg Oral Q6H PRN Yolanda Manges, DO       Or  . acetaminophen (TYLENOL) suppository 650 mg  650 mg Rectal Q6H PRN Yolanda Manges, DO      . albuterol (PROVENTIL) (2.5 MG/3ML) 0.083% nebulizer solution 2.5 mg  2.5 mg Nebulization Once Yolanda Manges, DO      . [START ON 09/21/2014] albuterol (PROVENTIL) (2.5 MG/3ML) 0.083%  nebulizer solution 2.5 mg  2.5 mg Nebulization Q6H PRN Yolanda Manges, DO      . brimonidine (ALPHAGAN) 0.15 % ophthalmic solution 1 drop  1 drop Left Eye Q12H Yolanda Manges, DO      . dorzolamide (TRUSOPT) 2 % ophthalmic solution 1 drop  1 drop Left Eye BID Yolanda Manges, DO      . Melene Muller ON 09/21/2014] furosemide (LASIX) injection 80 mg  80 mg Intravenous Once Megyn Leng, MD      . Melene Muller ON 09/21/2014] insulin aspart (novoLOG) injection 0-9 Units  0-9 Units Subcutaneous TID WC Yolanda Manges, DO      . Melene Muller ON 09/21/2014] lisinopril (PRINIVIL,ZESTRIL) tablet 5 mg  5 mg Oral Daily Yolanda Manges, DO      . [START ON 09/21/2014] metoprolol succinate (TOPROL-XL) 24 hr tablet 100 mg  100 mg Oral Daily Yolanda Manges, DO      . mometasone-formoterol [facility]) 100-5 MCG/ACT inhaler 2 puff  2 puff Inhalation BID Yolanda Manges, DO      . Melene Muller ON 09/21/2014] rivaroxaban (XARELTO) tablet 20 mg  20 mg Oral Q supper Hilario Quarry Amend, [facility]      . sodium chloride  0.9 % injection 3 mL  3 mL Intravenous Q12H Yolanda Manges, DO      . sodium chloride 0.9 % injection 3 mL  3 mL Intravenous Q12H Yolanda Manges, DO        Allergies: Allergies as of 09/20/2014  . (No Known Allergies)   Past Medical History  Diagnosis Date  . Hypertension   . Asthma   . Osteoarthritis     left shoulder  . Pseudogout   . Hypokalemia     a. Previously felt due to diuretics, req supplementation.  . Churg-Strauss syndrome     a. Sural nerve biopsy 02/2000.  Darene Lamer disease     a. s/p thyroidectomy.  . Glaucoma   . Hypercholesteremia   . Venous insufficiency   . Osteopenia     DEXA 2004  . Lipoma     left inner thigh  . Cataract     left eye  . VASCULITIS   . Chronic combined systolic and diastolic heart failure     a. 08/6107 EF:20-25%  . Pleural effusion, left     a. Thoracentesis 2001 - per notes, no malignant cells, was transudative.  Marland Kitchen NSVT (nonsustained ventricular tachycardia)     a. During CHF adm 2001.  .  Multifocal atrial tachycardia     a. Documented as OP 01/2012.  Marland Kitchen Ectopic atrial tachycardia     a. Documented on tele as IP 02/2012.  . Pulmonary HTN     a. Mod by echo 02/2012.  . Valvular heart disease     a. Echo 02/2012: mod MR/TR, mild AI.  Marland Kitchen Pericardial effusion     a. Echo 02/2012: small-mod pericardial effusion.  . Atrial fibrillation, permanent     a. Dx 04/2013, on metoprolol, dig and xarelto  . Shortness of breath   . CHF (congestive heart failure)   . On home oxygen therapy     "wear it mostly at night" (09/20/2014)  . Pneumonia 1930's  . Type II diabetes mellitus    Past Surgical History  Procedure Laterality Date  . Abdominal hysterectomy    . Salpingoophorectomy    . Thyroidectomy    . Hemiarthroplasty hip Right 10/2010    cemented for hip fracture, femoral neck - by Eulas Post, MD  . Cataract extraction Right   . Fracture surgery    . Sural nerve biopsy  02/2000   Family History  Problem Relation Age of Onset  . Diabetes Mother   . Diabetes Father   . Heart disease Neg Hx    History   Social History  . Marital Status: Widowed    Spouse Name: N/A    Number of Children: N/A  . Years of Education: N/A   Occupational History  . Retired    Social History Main Topics  . Smoking status: Never Smoker   . Smokeless tobacco: Never Used  . Alcohol Use: No  . Drug Use: No  . Sexual Activity: Not on file   Other Topics Concern  . Not on file   Social History Narrative   Lives in house, with family just next door.    Never smoker, no alcohol.      Used to work at [employer].    Before that, worked in food prep.                    Review of Systems: Review of Systems  Constitutional: Negative for fever, chills, weight  loss, malaise/fatigue and diaphoresis.  HENT: Negative for congestion, nosebleeds, sore throat and tinnitus.   Eyes: Negative.   Respiratory: Positive for cough and shortness of breath. Negative for hemoptysis,  sputum production and wheezing.   Cardiovascular: Positive for leg swelling. Negative for chest pain, palpitations, claudication and PND.  Gastrointestinal: Negative for heartburn, nausea, vomiting, abdominal pain, diarrhea, constipation, blood in stool and melena.  Genitourinary: Negative for dysuria and urgency.       +polyuria  Musculoskeletal: Positive for myalgias. Negative for back pain, joint pain, falls and neck pain.  Skin: Negative.   Neurological: Negative.  Negative for weakness and headaches.  Endo/Heme/Allergies: Negative.   Psychiatric/Behavioral: Negative.      Physical Exam: Blood pressure 132/91, pulse 82, temperature 97.8 F (36.6 C), temperature source Oral, resp. rate 16, height 5\' 5"  (1.651 m), weight 85.7 kg (188 lb 15 oz), SpO2 91 %. Physical Exam  Constitutional: She is oriented to person, place, and time. She appears well-developed and well-nourished. No distress.  HENT:  Head: Normocephalic and atraumatic.  Right Ear: External ear normal.  Left Ear: External ear normal.  Mouth/Throat: No oropharyngeal exudate.  Eyes: Conjunctivae and EOM are normal. Pupils are equal, round, and reactive to light. Right eye exhibits no discharge. Left eye exhibits no discharge. No scleral icterus.  Neck: Normal range of motion. Neck supple. No JVD present.  Cardiovascular: Exam reveals no gallop and no friction rub.   No murmur heard. Irregularly irregular. No murmurs.  Respiratory: Effort normal and breath sounds normal. No respiratory distress. She has no wheezes. She has no rales. She exhibits no tenderness.  Examined anteriorly only.   GI: Soft. She exhibits no distension and no mass. There is no tenderness. There is no rebound and no guarding.  Musculoskeletal: She exhibits edema. She exhibits no tenderness.  Has 3+ pitting edema upto her hips. No TTP on either leg.   Neurological: She is alert and oriented to person, place, and time. She has normal reflexes.  States  president is Sports administrator.   Skin: No rash noted. She is not diaphoretic. No pallor.     Lab results: Basic Metabolic Panel:  Recent Labs  16/10/96 1654  NA 137  K 4.0  CL 100  CO2 28  GLUCOSE 132*  BUN 14  CREATININE 0.91  CALCIUM 8.0*   Liver Function Tests:  Recent Labs  09/20/14 1654  AST 20  ALT 7  ALKPHOS 110  BILITOT 0.9  PROT 6.8  ALBUMIN 3.7   No results for input(s): LIPASE, AMYLASE in the last 72 hours. No results for input(s): AMMONIA in the last 72 hours. CBC:  Recent Labs  09/20/14 1654  WBC 1.8*  NEUTROABS PENDING  HGB 10.9*  HCT 35.8*  MCV 89.7  PLT 230   Cardiac Enzymes: No results for input(s): CKTOTAL, CKMB, CKMBINDEX, TROPONINI in the last 72 hours. BNP: No results for input(s): PROBNP in the last 72 hours. D-Dimer: No results for input(s): DDIMER in the last 72 hours. CBG:  Recent Labs  09/20/14 1529 09/20/14 2203  GLUCAP 118* 161*   Hemoglobin A1C: No results for input(s): HGBA1C in the last 72 hours. Fasting Lipid Panel: No results for input(s): CHOL, HDL, LDLCALC, TRIG, CHOLHDL, LDLDIRECT in the last 72 hours. Thyroid Function Tests: No results for input(s): TSH, T4TOTAL, FREET4, T3FREE, THYROIDAB in the last 72 hours. Anemia Panel: No results for input(s): VITAMINB12, FOLATE, FERRITIN, TIBC, IRON, RETICCTPCT in the last 72 hours. Coagulation: No results for input(s):  LABPROT, INR in the last 72 hours. Urine Drug Screen: Drugs of Abuse  No results found for: LABOPIA, COCAINSCRNUR, LABBENZ, AMPHETMU, THCU, LABBARB  Alcohol Level: No results for input(s): ETH in the last 72 hours. Urinalysis: No results for input(s): COLORURINE, LABSPEC, PHURINE, GLUCOSEU, HGBUR, BILIRUBINUR, KETONESUR, PROTEINUR, UROBILINOGEN, NITRITE, LEUKOCYTESUR in the last 72 hours.  Invalid input(s): APPERANCEUR Misc. Labs:  Imaging results:  Dg Chest 2 View  09/20/2014   CLINICAL DATA:  81 year old female with hypoxia. History of atrial  fibrillation.  EXAM: CHEST  2 VIEW  COMPARISON:  Chest x-ray 09/05/2014.  FINDINGS: Small bilateral pleural effusions. No consolidative airspace disease. No evidence of pulmonary edema. The continues to be some cephalization of the pulmonary vasculature. Heart size is moderately enlarged. Prominence of upper mediastinal soft tissues similar to numerous prior examinations, corresponding to prominent vascular structures and enlarged masslike left thyroid lobe on prior chest CT 08/18/2013, which explains the right-sided tracheal deviation on today's examination. Allowing for differences in positioning between today's study and prior studies, these findings are essentially unchanged.  IMPRESSION: 1. Cardiomegaly and pulmonary venous congestion, with resolution of previously noted mild interstitial pulmonary edema. 2. Small bilateral pleural effusions. 3. Atherosclerosis. 4. Additional incidental findings, similar prior studies, as above.   Electronically Signed   By: Trudie Reed M.D.   On: 09/20/2014 21:07    Other results: EKG: afib, anterolateral Q waves. No ST changes.   Assessment & Plan by Problem: Principal Problem:   Acute on chronic combined systolic and diastolic heart failure Active Problems:   Diabetes   Glaucoma   Essential hypertension   Chronic combined systolic and diastolic congestive heart failure   Asthma in adult   Current use of long term anticoagulation--xarelto   Leukopenia  81 yo female with hx of asthma, hx of PE and Afib on xarelto, combined CHF with chronic leg swelling and SOB here with increased SOB and increased leg swelling/leg pain concerning for recurrent PE vs volume overload.  SOB with hypoxia - concerning for recurrent PE as she was not therapeutic on xarelto (was on  dose daily).   CXR shows very mild effusion on right lung base, but shows prominent pulm vascular marking, which could be from her worsening Pulm HTN or clot? However, she denies any  pleuritic chest pain, she had some hypoxia but no tachycardia (but again she is on metoprolol). It is unclear whether she has a new acute PE or a non-resolving clot. CT angio may be helpful but only if it shows acute pe. Talked to pharmacist: if has acute PE would need to be on full dose  bid x21days then . Otherwise if it's chronic non resolving PE would be just  daily.OShe would not be a candidate for TPA currently as she is hemodynamically stable.  Does have some unclear leg pain (?DVT) but legs are equal in size and no TTP on exam.   SOB could also be from volume overload though. Her weight has increased, she has 3+ pitting edema on both legs  - 2D Echo to assess pulmonary HTN status, right heart strain.  - consider lower ext dopplers after diuresing the patient as study could be inconclusive because of severe leg edema currently. Exam is not impressive for DVT. -  xarelto from tomorrow for her prevoius PE. Already took  in the morning. - continue O2 for now. Decided in AM if want to pursue with CT angio but she has other reasons for hypoxia (including worsening Pulm  HTN vs volume overload) other than PE. I don't think D-dimer would be helpful with her hx of Churg-Strauss vasculitis.   Acute on Chronic CHF exacerbation plus worsening pulm HTN? Last ECHO 8/15 35-40% . CXR shows some basilar effusion. No chest pain or other symptoms suggestive of PE.  -Takes lasix 120 AM, 80 PM. Lisinopril , metoprolol. Will do  IV lasix tonight and then  IV lasix tomorrow. I expect she will require 1-2 days of IV diuresis to get her back to her baseline weight. This may be an on going problem due to her noncompliance.  - cont metoprolol and lisinopril. - strict i/o, daily weight - monitor kidney function and lytes - O2 to keep sat >92%. Keep legs elevated.   Pulmonary HTN -last PA pressure 62 unclear class. Could be Class I given hx of vasculitis, or from other causes such as  chronic PE, asthma, CHF.  Seems to be worsening based on Pulm vasculature changes on CXR.  - needs workup for Pulm HTN: sleep study, RF, ANCA, HIV. LFTs are normal.  May benefit from RHC and possibly treatment if it determined to be Class I (PDE-4 inhibitors?) - treat underlying conditions: asthma, CHF, PE for now. Oxygen has the highest mortality benefit.   Permanent Afib - on xarelto  (not sure why low dose). - rate controlled, cont metoprolol. Cont xarelto at  dose.  Chronic Asthma - stable no wheezing on exam. -cont -home meds: dulera + albuterol prn  Acute anemia - normocytic. baseline 13-15. Now 10.9, with high RDW. - no active bleeding. Could be dilutional from volume overload. Monitor for now.  DM II - hgba1c 7.6. - on metformin at home.  - cont SSI here.  Leukopenia - chronic issue. Monitor for now.  Graves d/z s/p thyroidectomy not on repalcement  Dispo: Disposition is deferred at this time, awaiting improvement of current medical problems. Anticipated discharge in approximately 1-2 day(s).   The patient does have a current PCP Yolanda Manges, DO) and does need an [facility] hospital follow-up appointment after discharge.  The patient does have transportation limitations that hinder transportation to clinic appointments.  Signed: Hyacinth Meeker, MD 09/20/2014, 10:35 PM

## 2014-09-20 NOTE — Progress Notes (Signed)
   Subjective:    Patient ID: [name], female    DOB: 17-Aug-1926, 81 y.o.   MRN: [id number]  HPI Comments: [name] is an 81 year old woman with a PMH of atrial fibrillation and PE (on Xarelto), HTN, systolic and diastolic HF (EF 71-21%), DM type 2, asthma and glaucoma here with c/o left leg pain x 3 days and continued B/L lower leg swelling for more than 1 month.  Please see problem based charting for details.     Review of Systems  Constitutional: Positive for appetite change.       Increased appetite  Respiratory: Positive for shortness of breath. Negative for wheezing.        Dyspnea at night and with exertion  Cardiovascular: Positive for chest pain and leg swelling.       One episode of chest pain last week.  Occurred at rest.  Lasted 5 minutes and resolved spontaneously.  Endocrine: Positive for polyuria.       She is urinating more with increased dose of Lasix.       Filed Vitals:   09/20/14 1512  BP: 128/74  Pulse: 93  Temp: 97.6 F (36.4 C)  SpO2 86%  Objective:   Physical Exam  Constitutional: She is oriented to person, place, and time. She appears well-developed. No distress.  HENT:  Head: Normocephalic and atraumatic.  Mouth/Throat: Oropharynx is clear and moist. No oropharyngeal exudate.  Eyes: EOM are normal. Pupils are equal, round, and reactive to light.  Neck: Neck supple.  Cardiovascular: Normal rate, normal heart sounds and intact distal pulses.  Exam reveals no gallop and no friction rub.   No murmur heard. Irregularly irregular  Pulmonary/Chest: Effort normal. No respiratory distress. She has wheezes. She has no rales.  Decreased BS B/L bases. occasional wheeze.   Abdominal: Soft. Bowel sounds are normal. She exhibits no distension. There is no tenderness. There is no rebound.  Musculoskeletal: Normal range of motion. She exhibits edema and tenderness.  B/L 3+ pitting edema legs and thighs; B/L legs are tender (L more tender than R).    Neurological: She is alert and oriented to person, place, and time. No cranial nerve deficit.  Skin: Skin is warm. She is not diaphoretic.  Psychiatric: She has a normal mood and affect. Her behavior is normal.  Vitals reviewed.         Assessment & Plan:  Please see problem based assessment and plan.

## 2014-09-20 NOTE — Telephone Encounter (Signed)
Will have PT inpatient and will plan to reorder home PT prior to discharge.

## 2014-09-20 NOTE — Telephone Encounter (Signed)
Call from Physical Therapist with [facility] - # [phone]  Therapist reports that she had a referral for PT  She has been trying to get in touch with pt for over a week and has not been able to get in touch with patient.  Unable to leave message. So unable to provide services. FYI

## 2014-09-20 NOTE — Telephone Encounter (Signed)
Pt in clinic at this time and being admitted

## 2014-09-21 DIAGNOSIS — I509 Heart failure, unspecified: Secondary | ICD-10-CM

## 2014-09-21 DIAGNOSIS — D72819 Decreased white blood cell count, unspecified: Secondary | ICD-10-CM

## 2014-09-21 DIAGNOSIS — I482 Chronic atrial fibrillation: Secondary | ICD-10-CM

## 2014-09-21 DIAGNOSIS — I4891 Unspecified atrial fibrillation: Secondary | ICD-10-CM

## 2014-09-21 DIAGNOSIS — R0602 Shortness of breath: Secondary | ICD-10-CM

## 2014-09-21 DIAGNOSIS — D649 Anemia, unspecified: Secondary | ICD-10-CM

## 2014-09-21 DIAGNOSIS — I272 Other secondary pulmonary hypertension: Secondary | ICD-10-CM | POA: Diagnosis present

## 2014-09-21 DIAGNOSIS — J9621 Acute and chronic respiratory failure with hypoxia: Secondary | ICD-10-CM | POA: Diagnosis present

## 2014-09-21 DIAGNOSIS — E876 Hypokalemia: Secondary | ICD-10-CM | POA: Diagnosis not present

## 2014-09-21 DIAGNOSIS — I2699 Other pulmonary embolism without acute cor pulmonale: Secondary | ICD-10-CM

## 2014-09-21 DIAGNOSIS — E890 Postprocedural hypothyroidism: Secondary | ICD-10-CM | POA: Diagnosis present

## 2014-09-21 DIAGNOSIS — I10 Essential (primary) hypertension: Secondary | ICD-10-CM | POA: Diagnosis not present

## 2014-09-21 DIAGNOSIS — J45909 Unspecified asthma, uncomplicated: Secondary | ICD-10-CM | POA: Diagnosis present

## 2014-09-21 DIAGNOSIS — T462X5A Adverse effect of other antidysrhythmic drugs, initial encounter: Secondary | ICD-10-CM | POA: Diagnosis not present

## 2014-09-21 DIAGNOSIS — I5043 Acute on chronic combined systolic (congestive) and diastolic (congestive) heart failure: Principal | ICD-10-CM

## 2014-09-21 DIAGNOSIS — Z96649 Presence of unspecified artificial hip joint: Secondary | ICD-10-CM | POA: Diagnosis present

## 2014-09-21 DIAGNOSIS — E0500 Thyrotoxicosis with diffuse goiter without thyrotoxic crisis or storm: Secondary | ICD-10-CM

## 2014-09-21 DIAGNOSIS — Z9981 Dependence on supplemental oxygen: Secondary | ICD-10-CM | POA: Diagnosis not present

## 2014-09-21 DIAGNOSIS — Z7901 Long term (current) use of anticoagulants: Secondary | ICD-10-CM | POA: Diagnosis not present

## 2014-09-21 DIAGNOSIS — E119 Type 2 diabetes mellitus without complications: Secondary | ICD-10-CM | POA: Diagnosis present

## 2014-09-21 DIAGNOSIS — E780 Pure hypercholesterolemia: Secondary | ICD-10-CM | POA: Diagnosis present

## 2014-09-21 DIAGNOSIS — H409 Unspecified glaucoma: Secondary | ICD-10-CM | POA: Diagnosis present

## 2014-09-21 DIAGNOSIS — R062 Wheezing: Secondary | ICD-10-CM

## 2014-09-21 DIAGNOSIS — Z86711 Personal history of pulmonary embolism: Secondary | ICD-10-CM | POA: Diagnosis not present

## 2014-09-21 DIAGNOSIS — Z9111 Patient's noncompliance with dietary regimen: Secondary | ICD-10-CM | POA: Diagnosis present

## 2014-09-21 LAB — BASIC METABOLIC PANEL
Anion gap: 7 (ref 5–15)
BUN: 13 mg/dL (ref 6–23)
CO2: 30 mmol/L (ref 19–32)
Calcium: 7.8 mg/dL — ABNORMAL LOW (ref 8.4–10.5)
Chloride: 101 mmol/L (ref 96–112)
Creatinine, Ser: 0.9 mg/dL (ref 0.50–1.10)
GFR calc Af Amer: 65 mL/min — ABNORMAL LOW (ref >90)
GFR calc non Af Amer: 56 mL/min — ABNORMAL LOW (ref >90)
Glucose, Bld: 111 mg/dL — ABNORMAL HIGH (ref 70–99)
POTASSIUM: 3.6 mmol/L (ref 3.5–5.1)
Sodium: 138 mmol/L (ref 135–145)

## 2014-09-21 LAB — CBC
HCT: 27.7 % — ABNORMAL LOW (ref 36.0–46.0)
HEMATOCRIT: 33.3 % — AB (ref 36.0–46.0)
Hemoglobin: 8.9 g/dL — ABNORMAL LOW (ref 12.0–15.0)
Hemoglobin: 10.5 g/dL — ABNORMAL LOW (ref 12.0–15.0)
MCH: 27.9 pg (ref 26.0–34.0)
MCH: 27.9 pg (ref 26.0–34.0)
MCHC: 32.1 g/dL (ref 30.0–36.0)
MCHC: 31.5 g/dL (ref 30.0–36.0)
MCV: 86.8 fL (ref 78.0–100.0)
MCV: 88.6 fL (ref 78.0–100.0)
PLATELETS: 221 10*3/uL (ref 150–400)
Platelets: 243 10*3/uL (ref 150–400)
RBC: 3.19 MIL/uL — ABNORMAL LOW (ref 3.87–5.11)
RBC: 3.76 MIL/uL — AB (ref 3.87–5.11)
RDW: 15.9 % — ABNORMAL HIGH (ref 11.5–15.5)
RDW: 16.2 % — AB (ref 11.5–15.5)
WBC: 2.5 10*3/uL — AB (ref 4.0–10.5)
WBC: 2.5 10*3/uL — AB (ref 4.0–10.5)

## 2014-09-21 LAB — GLUCOSE, CAPILLARY
GLUCOSE-CAPILLARY: 171 mg/dL — AB (ref 70–99)
Glucose-Capillary: 99 mg/dL (ref 70–99)
Glucose-Capillary: 147 mg/dL — ABNORMAL HIGH (ref 70–99)
Glucose-Capillary: 129 mg/dL — ABNORMAL HIGH (ref 70–99)

## 2014-09-21 LAB — PATHOLOGIST SMEAR REVIEW

## 2014-09-21 MED ORDER — FUROSEMIDE 10 MG/ML IJ SOLN
80.0000 mg | Freq: Two times a day (BID) | INTRAMUSCULAR | Status: DC
  Administered 2014-09-21 – 2014-10-01 (×21): 80 mg via INTRAVENOUS
  Filled 2014-09-21 (×24): qty 8

## 2014-09-21 MED ORDER — ALBUTEROL SULFATE (2.5 MG/3ML) 0.083% IN NEBU
2.5000 mg | INHALATION_SOLUTION | Freq: Four times a day (QID) | RESPIRATORY_TRACT | Status: DC
Start: 2014-09-21 — End: 2014-09-21
  Filled 2014-09-21: qty 3

## 2014-09-21 MED ORDER — ALBUTEROL SULFATE (2.5 MG/3ML) 0.083% IN NEBU
2.5000 mg | INHALATION_SOLUTION | Freq: Four times a day (QID) | RESPIRATORY_TRACT | Status: DC
  Administered 2014-09-21 – 2014-09-23 (×7): 2.5 mg via RESPIRATORY_TRACT
  Filled 2014-09-21 (×8): qty 3

## 2014-09-21 NOTE — Progress Notes (Signed)
[facility]  Patient Status: Active (receiving services up to time of hospitalization)  [facility] is providing the following services: PT  Received referral for [facility] services but admitted to hospital prior to start of services.  If patient discharges after hours, please call [phone].   Kizzie Furnish 09/21/2014, 8:51 AM

## 2014-09-21 NOTE — Evaluation (Signed)
Physical Therapy Evaluation Patient Details Name: [name] MRN: [id number] DOB: September 23, 1925 Today's Date: 09/21/2014   History of Present Illness  81 yo female with onset of LLE pain and LE edema, discovered pleural effusion, CHF, cardiomegaly and hypoxia on admission.  Clinical Impression  Pt was seen by PT to evaluate her care needs for follow up and noted she is too painful to stand up for more than brief time.  Her cardiopulm status factors as well as she is on 4L O2 via nasal cannula.  Her plan for PT is to follow up at [facility] but may not agree to this.      Follow Up Recommendations SNF;Supervision/Assistance - 24 hour    Equipment Recommendations  None recommended by PT    Recommendations for Other Services       Precautions / Restrictions Precautions Precautions: Other (comment) (telemetry) Restrictions Weight Bearing Restrictions: No      Mobility  Bed Mobility Overal bed mobility: Needs Assistance Bed Mobility: Supine to Sit     Supine to sit: Max assist;Mod assist     General bed mobility comments: uses bedrail and pushing off bed with hadns to assist herself  Transfers Overall transfer level: Needs assistance Equipment used: Rolling walker (2 wheeled);1 person hand held assist Transfers: Sit to/from [employer] Sit to Stand: Total assist;Max assist Stand pivot transfers: Max assist;Total assist       General transfer comment: Pain is a limitation on L knee  Ambulation/Gait             General Gait Details: unable  Stairs            Wheelchair Mobility    Modified Rankin (Stroke Patients Only)       Balance Overall balance assessment: Needs assistance Sitting-balance support: Feet supported Sitting balance-Leahy Scale: Fair   Postural control: Posterior lean Standing balance support: Bilateral upper extremity supported Standing balance-Leahy Scale: Poor                               Pertinent  Vitals/Pain Pain Assessment: 0-10 Pain Score: 10-Worst pain ever Pain Location: L knee Pain Intervention(s): Limited activity within patient's tolerance;Monitored during session;Repositioned                                                                              BP was 117/69, pulse 83 and O2 sats 93% at rest.  Home Living Family/patient expects to be discharged to:: Private residence Living Arrangements: Children;Other relatives Available Help at Discharge: Family;Available 24 hours/day Type of Home: House Home Access: Stairs to enter Entrance Stairs-Rails: None Entrance Stairs-Number of Steps: 2 Home Layout: One level Home Equipment: Walker - 2 wheels;Cane - single point;Toilet riser;Bedside commode;Hospital bed;Wheelchair - Careers adviser (comment) Additional Comments: per son and pt; pt was independent with ADLs prior to admission    Prior Function Level of Independence: Needs assistance   Gait / Transfers Assistance Needed: assist on steps; modified independent with RW inside home (per pt)  ADL's / Homemaking Assistance Needed: assist with bathing and dressing        Hand Dominance   Dominant Hand: Right  Extremity/Trunk Assessment   Upper Extremity Assessment: Generalized weakness           Lower Extremity Assessment: Generalized weakness      Cervical / Trunk Assessment: Normal  Communication   Communication: HOH  Cognition Arousal/Alertness: Lethargic Behavior During Therapy: Anxious Overall Cognitive Status: History of cognitive impairments - at baseline       Memory: Decreased recall of precautions;Decreased short-term memory              General Comments General comments (skin integrity, edema, etc.): Pt is confused and struggling to report the details of her home situation.    Exercises        Assessment/Plan    PT Assessment Patient needs continued PT services  PT Diagnosis Generalized weakness   PT Problem List Decreased  strength;Decreased range of motion;Decreased activity tolerance;Decreased balance;Decreased mobility;Decreased coordination;Decreased cognition;Decreased knowledge of use of DME;Decreased safety awareness;Cardiopulmonary status limiting activity;Pain;Obesity;Decreased skin integrity  PT Treatment Interventions DME instruction;Gait training;Stair training;Functional mobility training;Therapeutic activities;Therapeutic exercise;Balance training;Neuromuscular re-education;Cognitive remediation;Patient/family education   PT Goals (Current goals can be found in the Care Plan section) Acute Rehab PT Goals Patient Stated Goal: to go home PT Goal Formulation: With patient Time For Goal Achievement: 10/05/14 Potential to Achieve Goals: Fair    Frequency Min 3X/week   Barriers to discharge Inaccessible home environment      Co-evaluation               End of Session Equipment Utilized During Treatment: Oxygen;Other (comment) (FWW) Activity Tolerance: Patient limited by pain Patient left: in chair;with call bell/phone within reach;Other (comment) (residents in the r oom) Nurse Communication: Mobility status    Functional Assessment Tool Used: clinical judgment Functional Limitation: Mobility: Walking and moving around Mobility: Walking and Moving Around Current Status (K2706): At least 60 percent but less than 80 percent impaired, limited or restricted Mobility: Walking and Moving Around Goal Status [phone]): At least 1 percent but less than 20 percent impaired, limited or restricted    Time: 0925-0948 PT Time Calculation (min) (ACUTE ONLY): 23 min   Charges:   PT Evaluation $Initial PT Evaluation Tier I: 1 Procedure PT Treatments $Therapeutic Activity: 8-22 mins   PT G Codes:   PT G-Codes **NOT FOR INPATIENT CLASS** Functional Assessment Tool Used: clinical judgment Functional Limitation: Mobility: Walking and moving around Mobility: Walking and Moving Around Current Status  (G3151): At least 60 percent but less than 80 percent impaired, limited or restricted Mobility: Walking and Moving Around Goal Status [phone]): At least 1 percent but less than 20 percent impaired, limited or restricted    Ivar Drape 09/21/2014, 10:22 AM   Samul Dada, PT MS Acute Rehab Dept. Number: [phone]  .

## 2014-09-21 NOTE — Progress Notes (Signed)
  Echocardiogram 2D Echocardiogram has been performed.  Treva Huyett 09/21/2014, 12:20 PM

## 2014-09-21 NOTE — Progress Notes (Signed)
UR completed 

## 2014-09-21 NOTE — Progress Notes (Signed)
Subjective: [name]. [name] says she has had increased urine output since receiving IV lasix. She says that she feels like her shortness of breath has improved some but is still present. She is unsure if her LE swelling has improved. She complains of b/l leg pain from her feet through her knees that she says is somewhat improved since presenting in clinic. She asked several times what is causing the pain and we explained that she appears to be fluid overloaded from her heart failure. She said that she uses her home oxygen which was originally meant for night time during the day sometimes as well.   Objective: Vital signs in last 24 hours: Filed Vitals:   09/20/14 2331 09/21/14 0206 09/21/14 0525 09/21/14 0900  BP:  129/73 131/74 117/68  Pulse:  87 62 84  Temp:  97.7 F (36.5 C) 97.9 F (36.6 C)   TempSrc:  Oral Oral   Resp:  Height:      Weight:   184 lb 11.9 oz (83.8 kg)   SpO2: 93% 93% 94% 93%   Weight change:   Intake/Output Summary (Last 24 hours) at 09/21/14 1100 Last data filed at 09/21/14 1610  Gross per 24 hour  Intake    480 ml  Output    200 ml  Net    280 ml   Gen: A&O x 4, no acute distress, well developed, well nourished, sitting up in chair on nasal cannula HEENT: Atraumatic, PERRL, EOMI, sclerae anicteric, moist mucous membranes Heart: Irregularly irregular, normal S1 S2, no murmurs, rubs, or gallops Lungs: Diffuse bilateral expiratory wheezes without crackles, respirations unlabored while on nasal cannula Abd: Soft, non-tender, non-distended, + bowel sounds, no hepatosplenomegaly Ext: 2+ b/l pitting edema through knees, evidence of chronic venous stasis b/l   Lab Results: Basic Metabolic Panel:  Recent Labs Lab 09/20/14 1654 09/21/14 0455  NA 137 138  K 4.0 3.6  CL 100 101  CO2 28 30  GLUCOSE 132* 111*  BUN 14 13  CREATININE 0.91 0.90  CALCIUM 8.0* 7.8*   Liver Function Tests:  Recent Labs Lab 09/20/14 1654  AST 20  ALT 7    ALKPHOS 110  BILITOT 0.9  PROT 6.8  ALBUMIN 3.7   CBC:  Recent Labs Lab 09/20/14 1654 09/21/14 0455  WBC 1.8* 2.5*  NEUTROABS 0.9*  --   HGB 10.9* 8.9*  HCT 35.8* 27.7*  MCV 89.7 86.8  PLT 230 221   CBG:  Recent Labs Lab 09/20/14 1529 09/20/14 2203 09/21/14 0607  GLUCAP 118* 161* 99   BNP 550  Micro Results: No results found for this or any previous visit (from the past 240 hour(s)). Studies/Results: Dg Chest 2 View  09/20/2014   CLINICAL DATA:  81 year old female with hypoxia. History of atrial fibrillation.  EXAM: CHEST  2 VIEW  COMPARISON:  Chest x-ray 09/05/2014.  FINDINGS: Small bilateral pleural effusions. No consolidative airspace disease. No evidence of pulmonary edema. The continues to be some cephalization of the pulmonary vasculature. Heart size is moderately enlarged. Prominence of upper mediastinal soft tissues similar to numerous prior examinations, corresponding to prominent vascular structures and enlarged masslike left thyroid lobe on prior chest CT 08/18/2013, which explains the right-sided tracheal deviation on today's examination. Allowing for differences in positioning between today's study and prior studies, these findings are essentially unchanged.  IMPRESSION: 1. Cardiomegaly and pulmonary venous congestion, with resolution of previously noted mild interstitial pulmonary edema. 2. Small bilateral pleural effusions. 3.  Atherosclerosis. 4. Additional incidental findings, similar prior studies, as above.   Electronically Signed   By: Trudie Reed M.D.   On: 09/20/2014 21:07   Medications: I have reviewed the patient's current medications. Scheduled Meds: . albuterol  2.5 mg Nebulization Q6H  . brimonidine  1 drop Left Eye BID  . dorzolamide  1 drop Left Eye BID  . furosemide  80 mg Intravenous Once  . insulin aspart  0-9 Units Subcutaneous TID WC  . lisinopril  5 mg Oral Daily  . metoprolol succinate  100 mg Oral Daily  . mometasone-formoterol  2  puff Inhalation BID  . rivaroxaban  20 mg Oral Q supper  . sodium chloride  3 mL Intravenous Q12H  . sodium chloride  3 mL Intravenous Q12H   Continuous Infusions:  PRN Meds:.acetaminophen **OR** acetaminophen Assessment/Plan: Principal Problem:   Acute on chronic combined systolic and diastolic heart failure Active Problems:   Diabetes   Glaucoma   Essential hypertension   Chronic combined systolic and diastolic congestive heart failure   Asthma in adult   Current use of long term anticoagulation--xarelto   Leukopenia  #Dyspnea: [name] says her dyspnea has improved since diuresis initiated yesterday. She has been sating 93-94% on 4L nasal cannula overnight. She is supposed to use home oxygen only at night but says she has been using it during the day as well. Her SOB is likely secondary to fluid overload from acute on chronic CHF. Her b/l pitting edema is improving as it is now 2+ down from 3+ yesterday. Her lung exam had wheezes but no crackles and her CXR had pulmonary venous congestion and small b/l pleural effusions. Dyspnea may also be due to worsening pulmonary HTN and an echo was ordered. A right sided heart cath would be needed to characterize her pulmonary HTN but it is unclear if this should be performed given her age and co-morbidities. PE is less likely as she was on low dose xarelto which has been increased by admitting team. She also has a history of asthma. -cardiology consult -echo -continue diuresis with lasix 80 mg iv am, reassess for pm dose -xarelto 20 mg daily -dulera 2 puff inh bid -albuterol 2.5 mg neb q6h (scheduled) -O2 to keep sat >92% -PT/OT -I&O -daily weights  #Acute on chronic CHF: See above. She seemed improved on physical exam as described above. At home she is on toprol-xl 100 mg daily, lasix 120 mg am 80 mg pm. Last echo 03/2014 notable for EF 35-40%, akinesis of entire anteroseptal and basal-midinferoseptal myocardium, PA pressure 62 mm Hg, and  severely dilated left and right atria. -cards consult -echo -continue diuresis with lasix 80 mg iv am, reassess for pm dose -cont toprol-xl 100 mg daily -I&O -daily weights  #Anemia: [name]'s hemoglobin was 13.4 5 months ago On presentation it was down to 10.9 and this morning it is down to 8.9. She denies any hematachezia or melena. No signs of active bleeding. [facility] -repeat CBC in pm and consider GI consult if not stable  #Pulmonary HTN: [name]'s last PA pressure was 62 with unclear class. There were pulmonary vascular changes noted on CXR. Echo was ordered to evaluate and see if evidence of R heart strain. Unclear if R sided cath would be useful as we need goals of care discussion with family -cards consult -echo  #Asthma: See above. She was wheezy on exam so will make albuterol scheduled -albuterol 2.5 mg neb q6h -dulera 2 puff inh  bid  #Atrial Fibrillation: She was on xarelto 15 mg which was increased to 20 mg on admission. She is rate controlled with toprol-xl 100 mg daily at home.  -cont xarelto 20 mg daily -cont toprol-xl  #DM2: Last A1c 7.6 on 1/129/16. She takes metformin 1000 mg bid at home -hold metformin 1000 mg bid -SSI-sensitive  #HTN: BP 110s-140s/60s-90s. At home she is on lisinopril 5 mg daily, toprol-xl 100 mg daily, and lasix 120 mg am, 80 mg pm.  Dispo: Disposition is deferred at this time, awaiting improvement of current medical problems.  Anticipated discharge in approximately 2 day(s).   The patient does have a current PCP Yolanda Manges, DO) and does need an [facility] hospital follow-up appointment after discharge.  The patient does not know have transportation limitations that hinder transportation to clinic appointments.  .Services Needed at time of discharge: Y = Yes, Blank = No PT:   OT:   RN:   Equipment:   Other:     LOS: 1 day   Lorenda Hatchet, MD 09/21/2014, 11:00 AM

## 2014-09-21 NOTE — Progress Notes (Signed)
   09/21/14 0920  Clinical Encounter Type  Visited With Patient and family together  Visit Type Spiritual support  Referral From Patient  Spiritual Encounters  Spiritual Needs Prayer  Chaplain responded to spiritual care consult that pt requested prayer. Pt son at bedside. Pt requested prayer for healing, for her leg to get better so she can walk, and relief from pain. Chaplain provided prayer. Will follow as needed.  Wille Glaser 09/21/2014 9:21 AM

## 2014-09-21 NOTE — Evaluation (Signed)
Occupational Therapy Evaluation Patient Details Name: [name] MRN: [id number] DOB: 12-11-1925 Today's Date: 09/21/2014    History of Present Illness 81 yo female with onset of LLE pain and LE edema, discovered pleural effusion, CHF, cardiomegaly and hypoxia on admission.   Clinical Impression   This 81 yo female admitted with above presents to acute OT with generalized weakness, increased pain, decreased balance, decreased mobility all affecting her ability to care for herself at a S level with ocCasional A from family. She will benefit from acute OT with follow up at SNF to get back to a S level with occasional A.    Follow Up Recommendations  SNF    Equipment Recommendations  None recommended by OT       Precautions / Restrictions Precautions Precautions: Fall Restrictions Weight Bearing Restrictions: No Other Position/Activity Restrictions: However pt's lower legs and feet are really sore due to retaining fluid      Mobility Bed Mobility Overal bed mobility: Needs Assistance Bed Mobility: Supine to Sit     Supine to sit: Max assist;HOB elevated        Transfers Overall transfer level: Needs assistance Equipment used: Rolling walker (2 wheeled) Transfers: Sit to/from [employer] Sit to Stand: Max assist   Squat pivot transfers: Max assist               ADL Overall ADL's : Needs assistance/impaired Eating/Feeding: Set up;Sitting   Grooming: Set up;Sitting   Upper Body Bathing: Set up;Sitting   Lower Body Bathing: Total assistance (with Max sit<>stand from raised bed)   Upper Body Dressing : Moderate assistance;Sitting   Lower Body Dressing: Total assistance (with Max sit<>stand from raised bed)   Toilet Transfer: +2 for physical assistance;Maximal assistance (Attempted RW without success for stand pivot to recliner going to pt's right so did a squat pivot instead)   Toileting- Clothing Manipulation and Hygiene: Total assistance  (with Max sit<>stand from raised bed)                         Pertinent Vitals/Pain Pain Assessment: 0-10 Pain Score: 9  Pain Location: Bil LEs Pain Descriptors / Indicators: Aching;Sore Pain Intervention(s): Monitored during session;Repositioned;Patient requesting pain meds-RN notified     Hand Dominance Right   Extremity/Trunk Assessment Upper Extremity Assessment Upper Extremity Assessment: Generalized weakness           Communication Communication Communication: HOH   Cognition Arousal/Alertness: Awake/alert Behavior During Therapy: Anxious (due to pain in her legs) Overall Cognitive Status: Impaired/Different from baseline Area of Impairment: Memory     Memory: Decreased short-term memory                        Home Living Family/patient expects to be discharged to:: Private residence Living Arrangements: Children;Other relatives Available Help at Discharge: Family;Available 24 hours/day Type of Home: House Home Access: Stairs to enter [employer] of Steps: 2 Entrance Stairs-Rails: None Home Layout: One level     Bathroom Shower/Tub: Tub/shower unit;Curtain (says she sometimes gets down in tub) Shower/tub characteristics: Curtain Firefighter: Standard     Home Equipment: Environmental consultant - 2 wheels;Cane - single point;Toilet riser;Bedside commode;Hospital bed;Wheelchair - Careers adviser (comment)          Prior Functioning/Environment Level of Independence: Needs assistance  Gait / Transfers Assistance Needed: assist on steps; modified independent with RW inside home (per pt) ADL's / Homemaking Assistance Needed: assist with bathing and dressing--"i  do some and they do some depending on how I feel"   Comments: family does household chores, pt assists with dishes    OT Diagnosis: Generalized weakness;Acute pain   OT Problem List: Decreased strength;Decreased range of motion;Decreased activity tolerance;Pain;Impaired balance  (sitting and/or standing);Obesity   OT Treatment/Interventions: Self-care/ADL training;Therapeutic activities;DME and/or AE instruction;Balance training;Patient/family education    OT Goals(Current goals can be found in the care plan section) Acute Rehab OT Goals Patient Stated Goal: pt really wants to go home OT Goal Formulation: With patient Time For Goal Achievement: 10/05/14 Potential to Achieve Goals: Good  OT Frequency: Min 2X/week              End of Session Equipment Utilized During Treatment: Gait belt;Oxygen (4.5 liters) Nurse Communication:  (NT made aware +2 max A for back to bed (one on each side of pt))  Activity Tolerance: Patient limited by fatigue;Patient limited by pain Patient left: in chair;with call bell/phone within reach   Time: 1416-1441 OT Time Calculation (min): 25 min Charges:  OT General Charges $OT Visit: 1 Procedure OT Evaluation $Initial OT Evaluation Tier I: 1 Procedure OT Treatments $Self Care/Home Management : 8-22 mins  Evette Georges [phone] 09/21/2014, 3:04 PM

## 2014-09-21 NOTE — Consult Note (Signed)
Advanced Heart Failure Team Consult Note  Referring Physician: Dr Luciana Axe  Primary Physician: Primary Cardiologist:  Dr Antoine Poche   Reason for Consultation: Heart Failure   HPI:    [name] is an 81 year old with a history of HTN, DMT2, Asthma, Churg-Strauss syndrome,  Chronic AFIB on xarelto, PE, night time oxygen,  and CHF with EF of 40% in 2015 admitted with increased dyspnea and hypoxia.   She is followed by Dr Antoine Poche in the community. She had not been to see him in some time but then saw him in 07/2014 with volume overload noted due to dietary indiscretion. Lasix was increased to 80 mg twice a daily. Weight at that time was 179 pounds. Baseline was 167 pounds.   Admitted from family practice office with hypoxia (O2 sats 86%), increased dyspnea, lower extremity edema, and weight despite increasing diuetics at home (lasix 120 mg in am and 80 mg in pm) . Had been 15 mg Xarelto.  Weight on admit 188 pounds. CXR showed small pleural effusions and mild interstitial edema. She has been diuresing with IV lasix. Outputs not accurate. Weight down 4 pounds.   Mild dyspnea at rest. She is unable to tell about her medications. Daughter prepares medications. Limited mobility. Drinks lots of fluids.   Echo (09/21/14) LVEF 20-25% Moderate to severe RV dysfunction. Mild to moderate MR. Mild AI.   Pertinent admit labs include: K 4.0 Creatinine 0.91 BNP 549, WBC 1.8    Review of Systems: [y] = yes,  = no   General: Weight gain [Y ]; Weight loss ; Anorexia ; Fatigue [ Y]; Fever ; Chills ; Weakness [Y ]  Cardiac: Chest pain/pressure ; Resting SOB [ Y]; Exertional SOB [Y ]; Orthopnea ; Pedal Edema [Y ]; Palpitations ; Syncope ; Presyncope ; Paroxysmal nocturnal dyspnea[ ]   Pulmonary: Cough [Y]; Wheezing[ ] ; Hemoptysis[ ] ; Sputum ; Snoring   GI: Vomiting[ ] ; Dysphagia[ ] ; Melena[ ] ; Hematochezia ; Heartburn[ ] ; Abdominal pain ; Constipation ; Diarrhea ; BRBPR    GU: Hematuria[ ] ; Dysuria ; Nocturia[ ]   Vascular: Pain in legs with walking ; Pain in feet with lying flat ; Non-healing sores ; Stroke ; TIA ; Slurred speech ;  Neuro: Headaches[ ] ; Vertigo[ ] ; Seizures[ ] ; Paresthesias[ ] ;Blurred vision ; Diplopia ; Vision changes   Ortho/Skin: Arthritis [ Y]; Joint pain [Y]; Muscle pain ; Joint swelling ; Back Pain ; Rash   Psych: Depression[ ] ; Anxiety[ ]   Heme: Bleeding problems ; Clotting disorders ; Anemia   Endocrine: Diabetes [ Y]; Thyroid dysfunction[ ]   Home Medications Prior to Admission medications   Medication Sig Start Date End Date Taking? Authorizing Provider  albuterol (PROVENTIL HFA;VENTOLIN HFA) 108 (90 BASE) MCG/ACT inhaler Inhale 1-2 puffs into the lungs every 6 (six) hours as needed for shortness of breath. 06/14/13   Rocco Serene, MD  brimonidine (ALPHAGAN P) 0.1 % SOLN Place 1 drop into the left eye every 12 (twelve) hours. 05/27/12   Lars Mage, MD  dorzolamide (TRUSOPT) 2 % ophthalmic solution Place 1 drop into the left eye 2 (two) times daily.     Historical Provider, MD  furosemide (LASIX) 80 MG tablet Take 1-1.5 tablets (80-120 mg total) by mouth 2 (two) times daily. Take 1.5 tabs in morning, and 1 tab  in the evening. 09/05/14   Luisa Dago, MD  lisinopril (PRINIVIL,ZESTRIL) 5 MG tablet TAKE 1 TABLET BY MOUTH EVERY DAY 08/24/14   Yolanda Manges, DO  metFORMIN (GLUCOPHAGE) 1000 MG tablet TAKE 1 TABLET BY MOUTH TWICE DAILY WITH A MEAL 09/11/14   Yolanda Manges, DO  metoprolol succinate (TOPROL-XL) 100 MG 24 hr tablet TAKE 1 TABLET BY MOUTH DAILY WITH OR IMMEDIATELY FOLLOWING A MEAL 08/09/14   Yolanda Manges, DO  mometasone-formoterol (DULERA) 100-5 MCG/ACT AERO Inhale 2 puffs into the lungs 2 (two) times daily. 05/24/14   Yolanda Manges, DO  potassium chloride SA (K-DUR,KLOR-CON) 20 MEQ tablet TAKE 2 TABLETS BY MOUTH DAILY 09/11/14   Yolanda Manges, DO  XARELTO 15 MG TABS tablet  TAKE 1 TABLET BY MOUTH DAILY WITH SUPPER 09/11/14   Yolanda Manges, DO    Past Medical History: Past Medical History  Diagnosis Date  . Hypertension   . Asthma   . Osteoarthritis     left shoulder  . Pseudogout   . Hypokalemia     a. Previously felt due to diuretics, req supplementation.  . Churg-Strauss syndrome     a. Sural nerve biopsy 02/2000.  Darene Lamer disease     a. s/p thyroidectomy.  . Glaucoma   . Hypercholesteremia   . Venous insufficiency   . Osteopenia     DEXA 2004  . Lipoma     left inner thigh  . Cataract     left eye  . VASCULITIS   . Chronic combined systolic and diastolic heart failure     a. 01/3845 EF:20-25%  . Pleural effusion, left     a. Thoracentesis 2001 - per notes, no malignant cells, was transudative.  Marland Kitchen NSVT (nonsustained ventricular tachycardia)     a. During CHF adm 2001.  . Multifocal atrial tachycardia     a. Documented as OP 01/2012.  Marland Kitchen Ectopic atrial tachycardia     a. Documented on tele as IP 02/2012.  . Pulmonary HTN     a. Mod by echo 02/2012.  . Valvular heart disease     a. Echo 02/2012: mod MR/TR, mild AI.  Marland Kitchen Pericardial effusion     a. Echo 02/2012: small-mod pericardial effusion.  . Atrial fibrillation, permanent     a. Dx 04/2013, on metoprolol, dig and xarelto  . Shortness of breath   . CHF (congestive heart failure)   . On home oxygen therapy     "wear it mostly at night" (09/20/2014)  . Pneumonia 1930's  . Type II diabetes mellitus     Past Surgical History: Past Surgical History  Procedure Laterality Date  . Abdominal hysterectomy    . Salpingoophorectomy    . Thyroidectomy    . Hemiarthroplasty hip Right 10/2010    cemented for hip fracture, femoral neck - by Eulas Post, MD  . Cataract extraction Right   . Fracture surgery    . Sural nerve biopsy  02/2000    Family History: Family History  Problem Relation Age of Onset  . Diabetes Mother   . Diabetes Father   . Heart disease Neg Hx     Social  History: History   Social History  . Marital Status: Widowed    Spouse Name: N/A    Number of Children: N/A  . Years of Education: N/A   Occupational History  . Retired    Social History Main Topics  . Smoking status: Never Smoker   .  Smokeless tobacco: Never Used  . Alcohol Use: No  . Drug Use: No  . Sexual Activity: None   Other Topics Concern  . None   Social History Narrative   Lives in house, with family just next door.    Never smoker, no alcohol.      Used to work at [employer].    Before that, worked in food prep.                    Allergies:  No Known Allergies  Objective:    Vital Signs:   Temp:  [97.4 F (36.3 C)-97.9 F (36.6 C)] 97.9 F (36.6 C) (02/04 0525) Pulse Rate:  [56-93] 84 (02/04 0900) Resp:  [16-18] 18 (02/04 0900) BP: (117-140)/(68-91) 117/68 mmHg (02/04 0900) SpO2:  [86 %-94 %] 93 % (02/04 0900) Weight:  [184 lb 11.9 oz (83.8 kg)-190 lb 12.8 oz (86.546 kg)] 184 lb 11.9 oz (83.8 kg) (02/04 0525) Last BM Date: 09/19/14  Weight change: Filed Weights   09/20/14 1755 09/21/14 0525  Weight: 188 lb 15 oz (85.7 kg) 184 lb 11.9 oz (83.8 kg)    Intake/Output:   Intake/Output Summary (Last 24 hours) at 09/21/14 1207 Last data filed at 09/21/14 1157  Gross per 24 hour  Intake    480 ml  Output    500 ml  Net    -20 ml     Physical Exam: General:  Elderly  No resp difficulty HEENT: normal Neck: supple. JVP to jaw. Carotids 2+ bilat; no bruits. No lymphadenopathy or thryomegaly appreciated. Cor: PMI nondisplaced. Irregular  rate & rhythm. 2/6 SEM at apex Lungs: clear Abdomen: soft, nontender, nondistended. No hepatosplenomegaly. No bruits or masses. Good bowel sounds. Extremities: no cyanosis, clubbing, rash,  R and LLE 2-3+ edema Neuro: alert & orientedx3, cranial nerves grossly intact. moves all 4 extremities w/o difficulty. Affect pleasant  Telemetry:  Afib 70-90s Labs: Basic Metabolic Panel:  Recent  Labs Lab 09/20/14 1654 09/21/14 0455  NA 137 138  K 4.0 3.6  CL 100 101  CO2 28 30  GLUCOSE 132* 111*  BUN 14 13  CREATININE 0.91 0.90  CALCIUM 8.0* 7.8*    Liver Function Tests:  Recent Labs Lab 09/20/14 1654  AST 20  ALT 7  ALKPHOS 110  BILITOT 0.9  PROT 6.8  ALBUMIN 3.7   No results for input(s): LIPASE, AMYLASE in the last 168 hours. No results for input(s): AMMONIA in the last 168 hours.  CBC:  Recent Labs Lab 09/20/14 1654 09/21/14 0455  WBC 1.8* 2.5*  NEUTROABS 0.9*  --   HGB 10.9* 8.9*  HCT 35.8* 27.7*  MCV 89.7 86.8  PLT 230 221    Cardiac Enzymes: No results for input(s): CKTOTAL, CKMB, CKMBINDEX, TROPONINI in the last 168 hours.  BNP: BNP (last 3 results)  Recent Labs  09/05/14 0955 09/20/14 1654  BNP 779.3* 549.9*    ProBNP (last 3 results)  Recent Labs  12/14/13 1846 02/17/14 1350 04/05/14 0855  PROBNP 4874.0* 3166.0* 4201.0*     CBG:  Recent Labs Lab 09/20/14 1529 09/20/14 2203 09/21/14 0607 09/21/14 1108  GLUCAP 118* 161* 99 147*    Coagulation Studies: No results for input(s): LABPROT, INR in the last 72 hours.  Other results: EKG: AF 85 inferior and anterior Qs (no change from previous)  Imaging: Dg Chest 2 View  09/20/2014   CLINICAL DATA:  81 year old female with hypoxia. History of atrial fibrillation.  EXAM: CHEST  2 VIEW  COMPARISON:  Chest x-ray 09/05/2014.  FINDINGS: Small bilateral pleural effusions. No consolidative airspace disease. No evidence of pulmonary edema. The continues to be some cephalization of the pulmonary vasculature. Heart size is moderately enlarged. Prominence of upper mediastinal soft tissues similar to numerous prior examinations, corresponding to prominent vascular structures and enlarged masslike left thyroid lobe on prior chest CT 08/18/2013, which explains the right-sided tracheal deviation on today's examination. Allowing for differences in positioning between today's study and  prior studies, these findings are essentially unchanged.  IMPRESSION: 1. Cardiomegaly and pulmonary venous congestion, with resolution of previously noted mild interstitial pulmonary edema. 2. Small bilateral pleural effusions. 3. Atherosclerosis. 4. Additional incidental findings, similar prior studies, as above.   Electronically Signed   By: Trudie Reed M.D.   On: 09/20/2014 21:07      Medications:     Current Medications: . albuterol  2.5 mg Nebulization Q6H  . brimonidine  1 drop Left Eye BID  . dorzolamide  1 drop Left Eye BID  . insulin aspart  0-9 Units Subcutaneous TID WC  . lisinopril  5 mg Oral Daily  . metoprolol succinate  100 mg Oral Daily  . mometasone-formoterol  2 puff Inhalation BID  . rivaroxaban  20 mg Oral Q supper  . sodium chloride  3 mL Intravenous Q12H  . sodium chloride  3 mL Intravenous Q12H     Infusions:      Assessment:   1. Hypoxia likely due to volume overload.  2. A/C Systolic /Diastolic HF    --EF 20-25% with moderate to severe RV dysfunction  3. Chronic A fib Chads Vasc Score 6  4. H/O PE 5. Immobility 6. Leuopenia 7. Acute on chronic respiratory Failure - On nocturnal oxygen.   Plan/Discussion:     Length of Stay: 1  CLEGG,AMY NP-C  09/21/2014, 12:07 PM  Advanced Heart Failure Team Pager [phone] (M-F; 7a - 4p)  Please contact [facility] for night-coverage after hours (4p -7a ) and weekends on [email or web address]  Patient seen and examined with Tonye Becket, NP. We discussed all aspects of the encounter. I agree with the assessment and plan as stated above.   Ms. Suares had a/c systolic HF with marked volume overload. Echo shows worsening biventricular failure. She appears quite frail and has been noncompliant with dietary restriction. ECG suggestive of ischemic cardiomyopathy but I do not think she is a good candidate for cardiac cath. Would continue IV diuresis as well as ACE and b-blocker. Will titrate as needed. Continue xarelto  for AF and h/o PE. I think hypoxia can be explained on basis of HF alone and do not feel further w/u for recurrent PE is indicated at this point. Twelve month prognosis is poor.   Alexxa Sabet,MD 1:17 PM

## 2014-09-21 NOTE — Care Management Note (Signed)
    Page 1 of 2   10/02/2014     1:42:26 PM CARE MANAGEMENT NOTE 10/02/2014  Patient:  [name], [name]   Account Number:  [id number]  Date Initiated:  09/21/2014  Documentation initiated by:  Fatema Rabe  Subjective/Objective Assessment:   Pt adm on 09/20/14 with CHF.  PTA, pt resides at home with daughter.     Action/Plan:   PT/OT recommending SNF at dc...CSW consulted to facilitate poss dc to SNF when medically stable.  Will follow progress.   Anticipated DC Date:  10/02/2014   Anticipated DC Plan:  SKILLED NURSING FACILITY  In-house referral  Clinical Social Worker      DC Planning Services  CM consult      [facility] Choice  Resumption Of Svcs/PTA Provider   Choice offered to / List presented to:  C-4 Adult Children        HH arranged  HH-1 RN  HH-2 PT  HH-3 OT  HH-4 NURSE'S AIDE  HH-6 SOCIAL WORKER      Status of service:  Completed, signed off Medicare Important Message given?  YES (If response is "NO", the following Medicare IM given date fields will be blank) Date Medicare IM given:  09/25/2014 Medicare IM given by:  Emersen Carroll Date Additional Medicare IM given:  10/02/2014 Additional Medicare IM given by:  Sidney Ace  Discharge Disposition:  HOME [facility] SERVICES  Per UR Regulation:  Reviewed for med. necessity/level of care/duration of stay  If discussed at Long Length of Stay Meetings, dates discussed:   09/26/2014  09/28/2014    Comments:  10/02/14 Sidney Ace, RN, BSN [phone] Pt for dc home today with daughter [name] (cell [phone]); spoke with daughter by phone to confirm and finalize dc plans.  Pt active with [facility] for [facility] needs, but they had not started seeing pt prior to readmission. Daughter prefers to continue with this [facility] agency; pt has been referred to [facility] program for High Risk Readmission patients.  Daughter states pt has home O2 with [facility], and will bring portable tank for dc home.  Pt also has RW, hosp bed, BSC and WC at home.   Daughter declines need for hoyer lift or any other DME.  Daughter states she feels comfortable transporting pt home from hospital.

## 2014-09-22 DIAGNOSIS — E119 Type 2 diabetes mellitus without complications: Secondary | ICD-10-CM

## 2014-09-22 LAB — FERRITIN: FERRITIN: 11 ng/mL (ref 10–291)

## 2014-09-22 LAB — BASIC METABOLIC PANEL
Anion gap: 5 (ref 5–15)
BUN: 15 mg/dL (ref 6–23)
CHLORIDE: 100 mmol/L (ref 96–112)
CO2: 33 mmol/L — ABNORMAL HIGH (ref 19–32)
CREATININE: 0.84 mg/dL (ref 0.50–1.10)
Calcium: 7.9 mg/dL — ABNORMAL LOW (ref 8.4–10.5)
GFR, EST AFRICAN AMERICAN: 71 mL/min — AB (ref >90)
GFR, EST NON AFRICAN AMERICAN: 61 mL/min — AB (ref >90)
GLUCOSE: 144 mg/dL — AB (ref 70–99)
Potassium: 3.5 mmol/L (ref 3.5–5.1)
Sodium: 138 mmol/L (ref 135–145)

## 2014-09-22 LAB — CBC
HEMATOCRIT: 32.1 % — AB (ref 36.0–46.0)
Hemoglobin: 9.8 g/dL — ABNORMAL LOW (ref 12.0–15.0)
MCH: 27.1 pg (ref 26.0–34.0)
MCHC: 30.5 g/dL (ref 30.0–36.0)
MCV: 88.9 fL (ref 78.0–100.0)
Platelets: 226 10*3/uL (ref 150–400)
RBC: 3.61 MIL/uL — ABNORMAL LOW (ref 3.87–5.11)
RDW: 16.3 % — AB (ref 11.5–15.5)
WBC: 2.1 10*3/uL — ABNORMAL LOW (ref 4.0–10.5)

## 2014-09-22 LAB — FOLATE: FOLATE: 8 ng/mL

## 2014-09-22 LAB — RETICULOCYTES
RBC.: 3.61 MIL/uL — ABNORMAL LOW (ref 3.87–5.11)
Retic Count, Absolute: 36.1 10*3/uL (ref 19.0–186.0)
Retic Ct Pct: 1 % (ref 0.4–3.1)

## 2014-09-22 LAB — IRON AND TIBC
Iron: 13 ug/dL — ABNORMAL LOW (ref 42–145)
Saturation Ratios: 3 % — ABNORMAL LOW (ref 20–55)
TIBC: 392 ug/dL (ref 250–470)
UIBC: 379 ug/dL (ref 125–400)

## 2014-09-22 LAB — GLUCOSE, CAPILLARY
GLUCOSE-CAPILLARY: 121 mg/dL — AB (ref 70–99)
GLUCOSE-CAPILLARY: 125 mg/dL — AB (ref 70–99)
Glucose-Capillary: 159 mg/dL — ABNORMAL HIGH (ref 70–99)

## 2014-09-22 LAB — VITAMIN B12: Vitamin B-12: 253 pg/mL (ref 211–911)

## 2014-09-22 MED ORDER — HYDROCODONE-ACETAMINOPHEN 5-325 MG PO TABS
1.0000 | ORAL_TABLET | ORAL | Status: DC | PRN
  Administered 2014-09-22: 1 via ORAL
  Filled 2014-09-22: qty 1

## 2014-09-22 MED ORDER — SPIRONOLACTONE 12.5 MG HALF TABLET
12.5000 mg | ORAL_TABLET | Freq: Every day | ORAL | Status: DC
  Administered 2014-09-22 – 2014-09-24 (×3): 12.5 mg via ORAL
  Filled 2014-09-22 (×4): qty 1

## 2014-09-22 MED ORDER — HYDROCODONE-ACETAMINOPHEN 5-325 MG PO TABS
1.0000 | ORAL_TABLET | Freq: Three times a day (TID) | ORAL | Status: DC | PRN
  Administered 2014-09-22 – 2014-09-28 (×8): 1 via ORAL
  Filled 2014-09-22 (×9): qty 1

## 2014-09-22 NOTE — Progress Notes (Signed)
Subjective: [name]. [name] says her breathing has continued to improve. She notes she has had much more urination and thinks her leg pain may be somewhat improved. She received 1 dose norco from night MD team and said this was more effective than the tylenol. She was seen by heart failure team yesterday who said she is not a candidate for cardiac catheterization and recommend medical management with continued iv diuresis. She had no other complaints.   Objective: Vital signs in last 24 hours: Filed Vitals:   09/21/14 2011 09/22/14 0552 09/22/14 0737 09/22/14 1107  BP: 113/62 128/82  125/67  Pulse: 60 79    Temp: 97.8 F (36.6 C) 97.9 F (36.6 C)    TempSrc: Oral Oral    Resp: 18 18    Height:      Weight:  181 lb 3.5 oz (82.2 kg)    SpO2: 91% 92% 94%    Weight change: -7 lb 11.5 oz (-3.5 kg)  Intake/Output Summary (Last 24 hours) at 09/22/14 1126 Last data filed at 09/22/14 1051  Gross per 24 hour  Intake   1200 ml  Output   1150 ml  Net     50 ml   Gen: A&O x 4, no acute distress, well developed, well nourished, sitting up in bed on nasal cannula HEENT: Atraumatic, PERRL, EOMI, sclerae anicteric, moist mucous membranes Heart: Irregularly irregular, normal S1 S2, no murmurs, rubs, or gallops Lungs: Diffuse bilateral expiratory wheezes fainter than yesterday without crackles, respirations unlabored while on nasal cannula Abd: Soft, non-tender, non-distended, + bowel sounds, no hepatosplenomegaly Ext: 2+ b/l pitting edema up to knees, evidence of chronic venous stasis b/l   Lab Results: Basic Metabolic Panel:  Recent Labs Lab 09/21/14 0455 09/22/14 0417  NA 138 138  K 3.6 3.5  CL 101 100  CO2 30 33*  GLUCOSE 111* 144*  BUN 13 15  CREATININE 0.90 0.84  CALCIUM 7.8* 7.9*   Liver Function Tests:  Recent Labs Lab 09/20/14 1654  AST 20  ALT 7  ALKPHOS 110  BILITOT 0.9  PROT 6.8  ALBUMIN 3.7   CBC:  Recent Labs Lab 09/20/14 1654  09/21/14 1330  09/22/14 0417  WBC 1.8*  < > 2.5* 2.1*  NEUTROABS 0.9*  --   --   --   HGB 10.9*  < > 10.5* 9.8*  HCT 35.8*  < > 33.3* 32.1*  MCV 89.7  < > 88.6 88.9  PLT 230  < > 243 226  < > = values in this interval not displayed. CBG:  Recent Labs Lab 09/20/14 2203 09/21/14 0607 09/21/14 1108 09/21/14 1612 09/21/14 2052 09/22/14 0635  GLUCAP 161* 99 147* 171* 129* 121*   BNP 550  TTE:  - Left ventricle: The cavity size was normal. There was mild concentric hypertrophy. Systolic function was severely reduced. The estimated ejection fraction was in the range of 20% to 25%. Severe diffuse hypokinesis with no identifiable regional variations. - Ventricular septum: Septal motion showed abnormal function, dyssynergy, and paradox. The contour showed diastolic flattening. These changes are consistent with RV volume overload. - Aortic valve: There was mild regurgitation directed centrally in the LVOT. - Mitral valve: There was mild to moderate regurgitation directed centrally. The acceleration rate of the regurgitant jet was reduced, consistent with a low dP/dt. - Left atrium: The atrium was moderately to severely dilated. - Right ventricle: The cavity size was moderately dilated. Systolic function was moderately to severely reduced. - Right atrium:  The atrium was severely dilated. - Atrial septum: A patent foramen ovale cannot be excluded. - Tricuspid valve: There was malcoaptation of the valve leaflets. There was severe regurgitation directed centrally. - Pulmonary arteries: Systolic pressure was moderately increased. PA peak pressure: 54 mm Hg (S). - Pericardium, extracardiac: A small, free-flowing pericardial effusion was identified along the right atrial free wall. There was a right pleural effusion. There was a left pleural effusion.  Micro Results: No results found for this or any previous visit (from the past 240 hour(s)). Studies/Results: Dg Chest  2 View  09/20/2014   CLINICAL DATA:  81 year old female with hypoxia. History of atrial fibrillation.  EXAM: CHEST  2 VIEW  COMPARISON:  Chest x-ray 09/05/2014.  FINDINGS: Small bilateral pleural effusions. No consolidative airspace disease. No evidence of pulmonary edema. The continues to be some cephalization of the pulmonary vasculature. Heart size is moderately enlarged. Prominence of upper mediastinal soft tissues similar to numerous prior examinations, corresponding to prominent vascular structures and enlarged masslike left thyroid lobe on prior chest CT 08/18/2013, which explains the right-sided tracheal deviation on today's examination. Allowing for differences in positioning between today's study and prior studies, these findings are essentially unchanged.  IMPRESSION: 1. Cardiomegaly and pulmonary venous congestion, with resolution of previously noted mild interstitial pulmonary edema. 2. Small bilateral pleural effusions. 3. Atherosclerosis. 4. Additional incidental findings, similar prior studies, as above.   Electronically Signed   By: Trudie Reed M.D.   On: 09/20/2014 21:07   Medications: I have reviewed the patient's current medications. Scheduled Meds: . albuterol  2.5 mg Nebulization Q6H  . brimonidine  1 drop Left Eye BID  . dorzolamide  1 drop Left Eye BID  . furosemide  80 mg Intravenous BID  . insulin aspart  0-9 Units Subcutaneous TID WC  . lisinopril  5 mg Oral Daily  . metoprolol succinate  100 mg Oral Daily  . mometasone-formoterol  2 puff Inhalation BID  . rivaroxaban  20 mg Oral Q supper  . sodium chloride  3 mL Intravenous Q12H  . sodium chloride  3 mL Intravenous Q12H   Continuous Infusions:  PRN Meds:.HYDROcodone-acetaminophen Assessment/Plan: Principal Problem:   Acute on chronic combined systolic and diastolic heart failure Active Problems:   Diabetes   Glaucoma   Essential hypertension   Chronic combined systolic and diastolic congestive heart failure    Asthma in adult   Current use of long term anticoagulation--xarelto   Leukopenia  #AoC CHF: [name] continues to improve symptomatically and on exam. She continues to sat mid 90s on 4L O@ She is down 7 lbs from admission and supposedly 1.25L urine yesterday but unsure if accurate. Her echo yesterday was notable for deterioration of her EF from 35-50% in 03/2014 down to 20-25% now. TTE also noted severe diffuse hypokinesis, severe tricuspid regurgitation, pulmonary artery 54 mmHg (down from 61 mmHg 03/2014). Heart failure team has evaluated the patient and recommends continued medical management as she is not a candidate for cardiac catheterization. PT/OT recommends SNF placement. The patient says she is not sure if she would want to go to SNF on discharge versus live with her family members who care for her now. Given her poor prognosis, a goals of care discussion would be appropriate when family members are available to discuss maximizing [name] quality of life. At home she is on toprol-xl 100 mg daily, lisinopril 5 mg daily, lasix 120 mg am and 80 mg pm, xarelto 15 mg daily. -appreciate cards -  continue diuresis with lasix 80 mg iv bid -cont toprol-xl 100 mg daily, lisinopril 5 mg daily -xarelto 20 mg daily -O2 to keep sat >92% -PT/OT -I&O -daily weights -norco 5-325 mg po q8hprn for severe leg pain  #Anemia: [name]'s hemoglobin was 13.4 5 months ago and on presentation it was down to 10.9. Yesterday it was down to 8.9 but pm check was back up to 10.5 and stable this morning at 9.8. She denies any hematachezia or melena. No signs of active bleeding. [facility] -f/u anemia panel  #Asthma: Wheezing has improved on exam once albuterol made scheduled. -albuterol 2.5 mg neb q6h -dulera 2 puff inh bid  #Pulmonary HTN: [name]'s PA 54 mm Hg is improved from 61 mmHg in 03/2014.There were pulmonary vascular changes noted on CXR. Cardiology feels she is not a candidate for cardiac catheterization.  Goals of care discussion needed.  -appreciate cards  #Atrial Fibrillation: She was on xarelto 15 mg which was increased to 20 mg on admission. She is rate controlled with toprol-xl 100 mg daily at home.  -cont xarelto 20 mg daily -cont toprol-xl 100 mg daily  #DM2: Last A1c 7.6 on 1/129/16. She takes metformin 1000 mg bid at home -hold metformin 1000 mg bid -SSI-sensitive  #HTN: BP 110s-130s/60s-80s overnight. At home she is on lisinopril 5 mg daily, toprol-xl 100 mg daily, and lasix 120 mg am, 80 mg pm. -cont lisinopril 5 mg daily, toprol-xl 100 mg daily -lasix 80 mg iv bid as noted above  #Leukopenia: WBC 2.1 this morning. WBC has ranged from 1.8 on presentation to 2.5 today. This appears to be stable near her baseline for the past year. Differential distribution on admission 51% neutrophil, 29% lymphocyte, 16% monocyte, 3% eosinophil, 1% basophil.  Dispo: Disposition is deferred at this time, awaiting improvement of current medical problems.  Anticipated discharge in approximately 2 day(s).   The patient does have a current PCP Yolanda Manges, DO) and does need an [facility] hospital follow-up appointment after discharge.  The patient does not know have transportation limitations that hinder transportation to clinic appointments.  .Services Needed at time of discharge: Y = Yes, Blank = No PT:   OT:   RN:   Equipment:   Other:     LOS: 2 days   Wendy Hatchet, MD 09/22/2014, 11:26 AM

## 2014-09-22 NOTE — Progress Notes (Signed)
Physical Therapy Treatment Patient Details Name: [name] MRN: [id number] DOB: 1926/03/18 Today's Date: 09/22/2014    History of Present Illness 81 yo female with onset of LLE pain and LE edema, discovered pleural effusion, CHF, cardiomegaly and hypoxia on admission.    PT Comments    Pt making slow progress. Continue to feel she needs ST-SNF prior to return home.  Follow Up Recommendations  SNF     Equipment Recommendations  None recommended by PT    Recommendations for Other Services       Precautions / Restrictions Precautions Precautions: Fall    Mobility  Bed Mobility Overal bed mobility: Needs Assistance Bed Mobility: Supine to Sit     Supine to sit: Min assist     General bed mobility comments: Able to come to long sitting in bed with supervision but required assist to bring legs and hips around.  Transfers Overall transfer level: Needs assistance Equipment used: Rolling walker (2 wheeled) Transfers: Sit to/from [employer] Sit to Stand: +2 physical assistance;Mod assist Stand pivot transfers: +2 physical assistance;Mod assist       General transfer comment: Verbal cues for hand placement. Assist to bring hips up. Used rolling walker for pivot with pt having difficulty taking pivotal steps especially with left leg.  Ambulation/Gait Ambulation/Gait assistance: Mod assist;+2 safety/equipment (following with recliner) Ambulation Distance (Feet): 4 Feet Assistive device: Rolling walker (2 wheeled) Gait Pattern/deviations: Step-to pattern;Decreased step length - right;Decreased step length - left;Decreased stance time - left;Antalgic   Gait velocity interpretation: Below normal speed for age/gender General Gait Details: Pt with difficulty advancing LLE and also bearing weight on LLE.   Stairs            Wheelchair Mobility    Modified Rankin (Stroke Patients Only)       Balance Overall balance assessment: Needs  assistance Sitting-balance support: No upper extremity supported;Feet supported Sitting balance-Leahy Scale: Fair     Standing balance support: Bilateral upper extremity supported Standing balance-Leahy Scale: Poor Standing balance comment: support of walker and min A for static standing.                    Cognition Arousal/Alertness: Awake/alert Behavior During Therapy: WFL for tasks assessed/performed Overall Cognitive Status: No family/caregiver present to determine baseline cognitive functioning Area of Impairment: Memory     Memory: Decreased short-term memory              Exercises      General Comments        Pertinent Vitals/Pain Pain Assessment: Faces Faces Pain Scale: Hurts even more Pain Location: BLE's Pain Descriptors / Indicators: Aching;Sore Pain Intervention(s): Limited activity within patient's tolerance;Monitored during session;Repositioned    Home Living                      Prior Function            PT Goals (current goals can now be found in the care plan section) Progress towards PT goals: Progressing toward goals    Frequency  Min 3X/week    PT Plan Current plan remains appropriate    Co-evaluation             End of Session Equipment Utilized During Treatment: Gait belt;Oxygen Activity Tolerance: Patient limited by pain;Patient limited by fatigue Patient left: in chair;with call bell/phone within reach;with chair alarm set     Time: 4098-1191 PT Time Calculation (min) (ACUTE ONLY): 19 min  Charges:  $Gait Training: 8-22 mins                    G Codes:      [name] 07-Oct-2014, 1:42 PM  [facility] PT [phone]

## 2014-09-22 NOTE — Progress Notes (Signed)
Advanced Heart Failure Rounding Note   Subjective:     Feels weak. Breathing better. No CP, PND or orthopnea. Diuresing well and IV lasix. Weigh down 3-4 pounds. I/Os inaccurate.    Objective:   Weight Range:  Vital Signs:   Temp:  [97.5 F (36.4 C)-97.9 F (36.6 C)] 97.9 F (36.6 C) (02/05 0552) Pulse Rate:  [60-90] 79 (02/05 0552) Resp:  [16-18] 18 (02/05 0552) BP: (113-128)/(61-82) 125/67 mmHg (02/05 1107) SpO2:  [91 %-94 %] 94 % (02/05 0737) Weight:  [181 lb 3.5 oz (82.2 kg)] 181 lb 3.5 oz (82.2 kg) (02/05 0552) Last BM Date: 09/21/14  Weight change: Filed Weights   09/20/14 1755 09/21/14 0525 09/22/14 0552  Weight: 188 lb 15 oz (85.7 kg) 184 lb 11.9 oz (83.8 kg) 181 lb 3.5 oz (82.2 kg)    Intake/Output:   Intake/Output Summary (Last 24 hours) at 09/22/14 1138 Last data filed at 09/22/14 1127  Gross per 24 hour  Intake   1200 ml  Output   1270 ml  Net    -70 ml     Physical Exam:  General: Elderly No resp difficulty  HEENT: normal  Neck: supple. JVP to jaw. Carotids 2+ bilat; no bruits. No lymphadenopathy or thryomegaly appreciated.  Cor: PMI nondisplaced. Irregular rate & rhythm. 2/6 SEM at apex  Lungs: clear  Abdomen: soft, nontender, nondistended. No hepatosplenomegaly. No bruits or masses. Good bowel sounds.  Extremities: no cyanosis, clubbing, rash, R and LLE 2+ edema  Neuro: alert & orientedx3, cranial nerves grossly intact. moves all 4 extremities w/o difficulty. Affect pleasant   Telemetry: AF  Labs: Basic Metabolic Panel:  Recent Labs Lab 09/20/14 1654 09/21/14 0455 09/22/14 0417  NA 137 138 138  K 4.0 3.6 3.5  CL 100 101 100  CO2 28 30 33*  GLUCOSE 132* 111* 144*  BUN CREATININE 0.91 0.90 0.84  CALCIUM 8.0* 7.8* 7.9*    Liver Function Tests:  Recent Labs Lab 09/20/14 1654  AST 20  ALT 7  ALKPHOS 110  BILITOT 0.9  PROT 6.8  ALBUMIN 3.7   No results for input(s): LIPASE, AMYLASE in the last 168 hours. No results  for input(s): AMMONIA in the last 168 hours.  CBC:  Recent Labs Lab 09/20/14 1654 09/21/14 0455 09/21/14 1330 09/22/14 0417  WBC 1.8* 2.5* 2.5* 2.1*  NEUTROABS 0.9*  --   --   --   HGB 10.9* 8.9* 10.5* 9.8*  HCT 35.8* 27.7* 33.3* 32.1*  MCV 89.7 86.8 88.6 88.9  PLT 230 221 243 226    Cardiac Enzymes: No results for input(s): CKTOTAL, CKMB, CKMBINDEX, TROPONINI in the last 168 hours.  BNP: BNP (last 3 results)  Recent Labs  09/05/14 0955 09/20/14 1654  BNP 779.3* 549.9*    ProBNP (last 3 results)  Recent Labs  12/14/13 1846 02/17/14 1350 04/05/14 0855  PROBNP 4874.0* 3166.0* 4201.0*      Other results:    Imaging: Dg Chest 2 View  09/20/2014   CLINICAL DATA:  81 year old female with hypoxia. History of atrial fibrillation.  EXAM: CHEST  2 VIEW  COMPARISON:  Chest x-ray 09/05/2014.  FINDINGS: Small bilateral pleural effusions. No consolidative airspace disease. No evidence of pulmonary edema. The continues to be some cephalization of the pulmonary vasculature. Heart size is moderately enlarged. Prominence of upper mediastinal soft tissues similar to numerous prior examinations, corresponding to prominent vascular structures and enlarged masslike left thyroid lobe on prior chest CT 08/18/2013, which  explains the right-sided tracheal deviation on today's examination. Allowing for differences in positioning between today's study and prior studies, these findings are essentially unchanged.  IMPRESSION: 1. Cardiomegaly and pulmonary venous congestion, with resolution of previously noted mild interstitial pulmonary edema. 2. Small bilateral pleural effusions. 3. Atherosclerosis. 4. Additional incidental findings, similar prior studies, as above.   Electronically Signed   By: Trudie Reed M.D.   On: 09/20/2014 21:07      Medications:     Scheduled Medications: . albuterol  2.5 mg Nebulization Q6H  . brimonidine  1 drop Left Eye BID  . dorzolamide  1 drop Left Eye  BID  . furosemide  80 mg Intravenous BID  . insulin aspart  0-9 Units Subcutaneous TID WC  . lisinopril  5 mg Oral Daily  . metoprolol succinate  100 mg Oral Daily  . mometasone-formoterol  2 puff Inhalation BID  . rivaroxaban  20 mg Oral Q supper  . sodium chloride  3 mL Intravenous Q12H  . sodium chloride  3 mL Intravenous Q12H     Infusions:     PRN Medications:  HYDROcodone-acetaminophen   Assessment:   1. Hypoxia likely due to volume overload.  2. A/C Systolic /Diastolic HF  --EF 20-25% with moderate to severe RV dysfunction  3. Chronic A fib Chads Vasc Score 6  4. H/O PE  5. Immobility  6. Leuopenia  7. Acute on chronic respiratory Failure - On nocturnal oxygen.    Plan/Discussion:    She is diuresing well. Renal function stable. Would continue IV lasixas well as ACE and b-blocker. Will titrate as possible  Echo shows worsening biventricular failure. She appears quite frail and has been noncompliant with dietary restriction. ECG suggestive of ischemic cardiomyopathy but I do not think she is a good candidate for cardiac cath.  Continue xarelto for AF and h/o PE. I think hypoxia can be explained on basis of HF alone and do not feel further w/u for recurrent PE is indicated at this point. Twelve month prognosis is poor. Consider Palliative Care Consult for Goals of Care discussion.   Agness Sibrian,MD 11:40 AM Advanced Heart Failure Team Pager [phone] (M-F; 7a - 4p)  Please contact [facility] for night-coverage after hours (4p -7a ) and weekends on [email or web address]

## 2014-09-22 NOTE — Progress Notes (Signed)
Pt. C/o pain in Left leg unrelieved with tylenol. On call for Imts paged.

## 2014-09-22 NOTE — Progress Notes (Signed)
°   09/22/14 1200  Spiritual Encounters  Spiritual Needs Prayer  Chaplain initiated visit with patient.  Chaplain prayed with patient about leg pain and desire to walk.  Will follow as needed. Lillie Fragmin Chaplain 09/22/2014  12:28 PM

## 2014-09-23 DIAGNOSIS — I10 Essential (primary) hypertension: Secondary | ICD-10-CM

## 2014-09-23 DIAGNOSIS — I5042 Chronic combined systolic (congestive) and diastolic (congestive) heart failure: Secondary | ICD-10-CM

## 2014-09-23 LAB — GLUCOSE, CAPILLARY
GLUCOSE-CAPILLARY: 112 mg/dL — AB (ref 70–99)
GLUCOSE-CAPILLARY: 157 mg/dL — AB (ref 70–99)
Glucose-Capillary: 144 mg/dL — ABNORMAL HIGH (ref 70–99)
Glucose-Capillary: 171 mg/dL — ABNORMAL HIGH (ref 70–99)

## 2014-09-23 LAB — BASIC METABOLIC PANEL
Anion gap: 8 (ref 5–15)
BUN: 16 mg/dL (ref 6–23)
CHLORIDE: 101 mmol/L (ref 96–112)
CO2: 31 mmol/L (ref 19–32)
Calcium: 7.6 mg/dL — ABNORMAL LOW (ref 8.4–10.5)
Creatinine, Ser: 0.71 mg/dL (ref 0.50–1.10)
GFR calc non Af Amer: 76 mL/min — ABNORMAL LOW (ref >90)
GFR, EST AFRICAN AMERICAN: 88 mL/min — AB (ref >90)
Glucose, Bld: 103 mg/dL — ABNORMAL HIGH (ref 70–99)
Potassium: 3.5 mmol/L (ref 3.5–5.1)
Sodium: 140 mmol/L (ref 135–145)

## 2014-09-23 LAB — CBC
HCT: 32.7 % — ABNORMAL LOW (ref 36.0–46.0)
Hemoglobin: 9.9 g/dL — ABNORMAL LOW (ref 12.0–15.0)
MCH: 27.2 pg (ref 26.0–34.0)
MCHC: 30.3 g/dL (ref 30.0–36.0)
MCV: 89.8 fL (ref 78.0–100.0)
PLATELETS: 223 10*3/uL (ref 150–400)
RBC: 3.64 MIL/uL — ABNORMAL LOW (ref 3.87–5.11)
RDW: 16.4 % — ABNORMAL HIGH (ref 11.5–15.5)
WBC: 2.4 10*3/uL — ABNORMAL LOW (ref 4.0–10.5)

## 2014-09-23 MED ORDER — IPRATROPIUM-ALBUTEROL 0.5-2.5 (3) MG/3ML IN SOLN
3.0000 mL | Freq: Three times a day (TID) | RESPIRATORY_TRACT | Status: DC
  Administered 2014-09-23 – 2014-09-24 (×3): 3 mL via RESPIRATORY_TRACT
  Filled 2014-09-23 (×3): qty 3

## 2014-09-23 MED ORDER — IPRATROPIUM-ALBUTEROL 0.5-2.5 (3) MG/3ML IN SOLN: 3.0000 mL | RESPIRATORY_TRACT | Status: DC | PRN

## 2014-09-23 MED ORDER — SENNOSIDES-DOCUSATE SODIUM 8.6-50 MG PO TABS
2.0000 | ORAL_TABLET | Freq: Once | ORAL | Status: AC
  Administered 2014-09-23: 2 via ORAL
  Filled 2014-09-23: qty 2

## 2014-09-23 NOTE — Progress Notes (Signed)
Subjective: [name]. Ms Ton continues to report that her breathing is improving and her leg pain is decreasing. sHe was unsure if her leg swelling was improved. She says she does not want to go to SNF on discharge and wants to be discharged to her daughter's house. There is reportedly 24 hour supervision at this house as grandchildren are available during the day when her daughter is at work. I had explained that it would be important for her to go to a SNF where she can have intensive therapy but she says she will only go home on discharge. She had no other complaints.   Objective: Vital signs in last 24 hours: Filed Vitals:   09/22/14 1934 09/22/14 1954 09/23/14 0211 09/23/14 0439  BP:  121/67  131/60  Pulse:  83  74  Temp:  97.6 F (36.4 C)  97.9 F (36.6 C)  TempSrc:  Oral  Oral  Resp:  18  16  Height:      Weight:    181 lb 7 oz (82.3 kg)  SpO2: 92% 98% 93% 99%   Weight change: 3.5 oz (0.1 kg)  Intake/Output Summary (Last 24 hours) at 09/23/14 0920 Last data filed at 09/23/14 0605  Gross per 24 hour  Intake   1000 ml  Output    920 ml  Net     80 ml   Gen: A&O x 4, no acute distress, well developed, well nourished, resting in bed on nasal cannula HEENT: Atraumatic, PERRL, EOMI, sclerae anicteric, moist mucous membranes Heart: Irregularly irregular, normal S1 S2, no murmurs, rubs, or gallops Lungs: Clear to auscultation bilaterally, respirations unlabored while on nasal cannula Abd: Soft, non-tender, non-distended, + bowel sounds, no hepatosplenomegaly Ext: 1-2+ b/l pitting edema up to knees, evidence of chronic venous stasis b/l with ted hose in place   Lab Results: Basic Metabolic Panel:  Recent Labs Lab 09/22/14 0417 09/23/14 0337  NA 138 140  K 3.5 3.5  CL 100 101  CO2 33* 31  GLUCOSE 144* 103*  BUN 15 16  CREATININE 0.84 0.71  CALCIUM 7.9* 7.6*   Liver Function Tests:  Recent Labs Lab 09/20/14 1654  AST 20  ALT 7  ALKPHOS 110  BILITOT 0.9    PROT 6.8  ALBUMIN 3.7   CBC:  Recent Labs Lab 09/20/14 1654  09/22/14 0417 09/23/14 0337  WBC 1.8*  < > 2.1* 2.4*  NEUTROABS 0.9*  --   --   --   HGB 10.9*  < > 9.8* 9.9*  HCT 35.8*  < > 32.1* 32.7*  MCV 89.7  < > 88.9 89.8  PLT 230  < > 226 223  < > = values in this interval not displayed. CBG:  Recent Labs Lab 09/21/14 1612 09/21/14 2052 09/22/14 0635 09/22/14 1113 09/22/14 1636 09/23/14 0555  GLUCAP 171* 129* 121* 125* 159* 112*   BNP 550  TTE:  - Left ventricle: The cavity size was normal. There was mild concentric hypertrophy. Systolic function was severely reduced. The estimated ejection fraction was in the range of 20% to 25%. Severe diffuse hypokinesis with no identifiable regional variations. - Ventricular septum: Septal motion showed abnormal function, dyssynergy, and paradox. The contour showed diastolic flattening. These changes are consistent with RV volume overload. - Aortic valve: There was mild regurgitation directed centrally in the LVOT. - Mitral valve: There was mild to moderate regurgitation directed centrally. The acceleration rate of the regurgitant jet was reduced, consistent with a low dP/dt. -  Left atrium: The atrium was moderately to severely dilated. - Right ventricle: The cavity size was moderately dilated. Systolic function was moderately to severely reduced. - Right atrium: The atrium was severely dilated. - Atrial septum: A patent foramen ovale cannot be excluded. - Tricuspid valve: There was malcoaptation of the valve leaflets. There was severe regurgitation directed centrally. - Pulmonary arteries: Systolic pressure was moderately increased. PA peak pressure: 54 mm Hg (S). - Pericardium, extracardiac: A small, free-flowing pericardial effusion was identified along the right atrial free wall. There was a right pleural effusion. There was a left pleural effusion.  Micro Results: No results found  for this or any previous visit (from the past 240 hour(s)). Studies/Results: No results found. Medications: I have reviewed the patient's current medications. Scheduled Meds: . albuterol  2.5 mg Nebulization Q6H  . brimonidine  1 drop Left Eye BID  . dorzolamide  1 drop Left Eye BID  . furosemide  80 mg Intravenous BID  . insulin aspart  0-9 Units Subcutaneous TID WC  . lisinopril  5 mg Oral Daily  . metoprolol succinate  100 mg Oral Daily  . mometasone-formoterol  2 puff Inhalation BID  . rivaroxaban  20 mg Oral Q supper  . sodium chloride  3 mL Intravenous Q12H  . sodium chloride  3 mL Intravenous Q12H  . spironolactone  12.5 mg Oral Daily   Continuous Infusions:  PRN Meds:.HYDROcodone-acetaminophen Assessment/Plan: Principal Problem:   Acute on chronic combined systolic and diastolic heart failure Active Problems:   Diabetes   Glaucoma   Essential hypertension   Chronic combined systolic and diastolic congestive heart failure   Asthma in adult   Current use of long term anticoagulation--xarelto   Leukopenia  #AoC CHF: Ms Rissmiller continues to improve symptomatically and on exam. She has recently been sating up to the 98% on 4L so RN instructed to begin to down titrate oxygen as tolerated. Her new echo was notable for deterioration of her EF from 35-50% in 03/2014 down to 20-25% now. TTE also noted severe diffuse hypokinesis, severe tricuspid regurgitation, pulmonary artery 54 mmHg (down from 61 mmHg 03/2014). Heart failure team has evaluated the patient and recommends continued medical management as she is not a candidate for cardiac catheterization. PT/OT recommends SNF placement. As noted above, pt refuses to go to SNF on discharge and says she will have 24 hour supervision at her daughter's house. She is down 7 lbs from admission but stable weight overnight with inaccurate I&O documentation likely related to incontinence. We considered palliative consult regarding her poor  prognosis, but rounding team includes her PCP and will have continued conversations with patient and family while admitted as well as on out-patient follow-up. At home she is on toprol-xl 100 mg daily, lisinopril 5 mg daily, lasix 120 mg am and 80 mg pm, xarelto 15 mg daily. -appreciate cards -continue diuresis with lasix 80 mg iv bid -cont toprol-xl 100 mg daily, lisinopril 5 mg daily -xarelto 20 mg daily -RN to wean O2 to keep sat >92% -PT/OT -I&O -daily weights -norco 5-325 mg po q8hprn for severe leg pain  #Anemia: Ms Malson's hemoglobin was 13.4 5 months ago and on presentation it was down to 10.9. Yesterday it was down to 8.9 but pm check was back up to 10.5 and stable this morning at 9.9. She denies any hematachezia or melena. No signs of active bleeding. -FOBC -f/u anemia panel  #Asthma: Wheezing no longer present on exam once albuterol made scheduled. -albuterol  2.5 mg neb q6h -dulera 2 puff inh bid  #Pulmonary HTN: Ms Sidell's PA 54 mm Hg is improved from 61 mmHg in 03/2014.There were pulmonary vascular changes noted on CXR. Cardiology feels she is not a candidate for cardiac catheterization. Goals of care discussion needed.  -appreciate cards  #Atrial Fibrillation: She was on xarelto 15 mg which was increased to 20 mg on admission. She is rate controlled with toprol-xl 100 mg daily at home.  -cont xarelto 20 mg daily -cont toprol-xl 100 mg daily  #DM2: Last A1c 7.6 on 1/129/16. She takes metformin 1000 mg bid at home -hold metformin 1000 mg bid -SSI-sensitive  #HTN: BP 10s-130s/50s-60s overnight. At home she is on lisinopril 5 mg daily, toprol-xl 100 mg daily, and lasix 120 mg am, 80 mg pm. -cont lisinopril 5 mg daily, toprol-xl 100 mg daily -lasix 80 mg iv bid as noted above  #Leukopenia: WBC 2.4 this morning. WBC has ranged from 1.8 on presentation to 2.5. This appears to be stable near her baseline for the past year. Differential distribution on admission 51%  neutrophil, 29% lymphocyte, 16% monocyte, 3% eosinophil, 1% basophil.  Dispo: Disposition is deferred at this time, awaiting improvement of current medical problems.  Anticipated discharge in approximately 1-2 day(s).   The patient does have a current PCP Yolanda Manges, DO) and does need an [facility] hospital follow-up appointment after discharge.  The patient does not know have transportation limitations that hinder transportation to clinic appointments.  .Services Needed at time of discharge: Y = Yes, Blank = No PT:   OT:   RN:   Equipment:   Other:     LOS: 3 days   Lorenda Hatchet, MD 09/23/2014, 9:20 AM

## 2014-09-23 NOTE — Progress Notes (Signed)
Patient Name: [name] Date of Encounter: 09/23/2014  Principal Problem:   Acute on chronic combined systolic and diastolic heart failure Active Problems:   Diabetes   Glaucoma   Essential hypertension   Chronic combined systolic and diastolic congestive heart failure   Asthma in adult   Current use of long term anticoagulation--xarelto   Leukopenia   Length of Stay: 3  SUBJECTIVE  Improved SOB.  CURRENT MEDS . brimonidine  1 drop Left Eye BID  . dorzolamide  1 drop Left Eye BID  . furosemide  80 mg Intravenous BID  . insulin aspart  0-9 Units Subcutaneous TID WC  . ipratropium-albuterol  3 mL Nebulization TID  . lisinopril  5 mg Oral Daily  . metoprolol succinate  100 mg Oral Daily  . mometasone-formoterol  2 puff Inhalation BID  . rivaroxaban  20 mg Oral Q supper  . sodium chloride  3 mL Intravenous Q12H  . sodium chloride  3 mL Intravenous Q12H  . spironolactone  12.5 mg Oral Daily    OBJECTIVE  Filed Vitals:   09/22/14 1934 09/22/14 1954 09/23/14 0211 09/23/14 0439  BP:  121/67  131/60  Pulse:  83  74  Temp:  97.6 F (36.4 C)  97.9 F (36.6 C)  TempSrc:  Oral  Oral  Resp:  18  16  Height:      Weight:    181 lb 7 oz (82.3 kg)  SpO2: 92% 98% 93% 99%    Intake/Output Summary (Last 24 hours) at 09/23/14 1322 Last data filed at 09/23/14 1000  Gross per 24 hour  Intake   1020 ml  Output    800 ml  Net    220 ml   Filed Weights   09/21/14 0525 09/22/14 0552 09/23/14 0439  Weight: 184 lb 11.9 oz (83.8 kg) 181 lb 3.5 oz (82.2 kg) 181 lb 7 oz (82.3 kg)    PHYSICAL EXAM  General: Pleasant, NAD. Neuro: Alert and oriented X 3. Moves all extremities spontaneously. Psych: Normal affect. HEENT:  Normal  Neck: Supple without bruits or JVD. Lungs:  Resp regular and unlabored, CTA. Heart: RRR no s3, s4, or murmurs. Abdomen: Soft, non-tender, non-distended, BS + x 4.  Extremities: No clubbing, cyanosis or edema. DP/PT/Radials 2+ and equal  bilaterally.  Accessory Clinical Findings  CBC  Recent Labs  09/20/14 1654  09/22/14 0417 09/23/14 0337  WBC 1.8*  < > 2.1* 2.4*  NEUTROABS 0.9*  --   --   --   HGB 10.9*  < > 9.8* 9.9*  HCT 35.8*  < > 32.1* 32.7*  MCV 89.7  < > 88.9 89.8  PLT 230  < > 226 223  < > = values in this interval not displayed. Basic Metabolic Panel  Recent Labs  09/22/14 0417 09/23/14 0337  NA 138 140  K 3.5 3.5  CL 100 101  CO2 33* 31  GLUCOSE 144* 103*  BUN 15 16  CREATININE 0.84 0.71  CALCIUM 7.9* 7.6*   Liver Function Tests  Recent Labs  09/20/14 1654  AST 20  ALT 7  ALKPHOS 110  BILITOT 0.9  PROT 6.8  ALBUMIN 3.7   Radiology/Studies  Dg Chest 2 View  09/20/2014   CLINICAL DATA:  81 year old female with hypoxia. History of atrial fibrillation.   IMPRESSION: 1. Cardiomegaly and pulmonary venous congestion, with resolution of previously noted mild interstitial pulmonary edema. 2. Small bilateral pleural effusions. 3. Atherosclerosis. 4. Additional incidental findings, similar prior  studies, as above.   Electronically Signed   By: Trudie Reed M.D.   On: 09/20/2014 21:07   Dg Chest 2 View  09/05/2014   CLINICAL DATA:  Shortness of breath.    IMPRESSION: 1. Severe cardiomegaly again noted. 2. Findings consistent with mild congestive heart failure again noted. Similar finding noted on prior study 10/07/2013.   Electronically Signed   By: Maisie Fus  Register   On: 09/05/2014 13:05   TELE: SR, frequent PVCs    ASSESSMENT AND PLAN  1. Hypoxia likely due to volume overload.  2. A/C Systolic /Diastolic HF  --EF 20-25% with moderate to severe RV dysfunction  3. Chronic A fib Chads Vasc Score 6  4. H/O PE 5. Immobility 6. Leuopenia 7. Acute on chronic respiratory Failure - On nocturnal oxygen.   [name] had a/c systolic HF with marked volume overload. Echo shows worsening biventricular failure. She appears quite frail and has been noncompliant with dietary restriction.  ECG suggestive of ischemic cardiomyopathy but I do not think she is a good candidate for cardiac cath. Would continue IV diuresis as well as ACE and b-blocker. Will titrate as needed. Continue xarelto for AF and h/o PE. I think hypoxia can be explained on basis of HF alone and do not feel further w/u for recurrent PE is indicated at this point. Twelve month prognosis is poor.   Inaccurate I&O, nut - 3 lbs and clinical improvement, still significant fluid overload, stable crea, i would continue the same dose of iv lasix 80 mg BID. We will follow.     Signed, Lars Masson MD, [facility] 09/23/2014

## 2014-09-23 NOTE — Progress Notes (Signed)
Clinical Social Work Department BRIEF PSYCHOSOCIAL ASSESSMENT 09/23/2014  Patient:  [name], [name]     Account Number:  [id number]     Admit date:  09/20/2014  Clinical Social Worker:  Maryln Manuel  Date/Time:  09/23/2014 10:29 AM  Referred by:  Physician  Date Referred:  09/23/2014 Referred for  SNF Placement   Other Referral:   Interview type:  Patient Other interview type:    PSYCHOSOCIAL DATA Living Status:  FAMILY Admitted from facility:   Level of care:   Primary support name:  [name] Jennings/daughter/[phone] Primary support relationship to patient:  CHILD, ADULT Degree of support available:   adequate    CURRENT CONCERNS Current Concerns  Post-Acute Placement   Other Concerns:    SOCIAL WORK ASSESSMENT / PLAN CSW received referral for new SNF.    CSW met with pt at bedside. No family present at this time. CSW introduced self and explained role. Pt sleeping upon CSW entering the room, but awoke when CSW called pt name. CSW discussed PT recommendation for ST rehab at [facility]. CSW clarified pt question about what that entailed. Pt adamantly declines SNF. Pt states that pt daughter does not want her to go to SNF for rehab and can assist with pt care at home. CSW expressed understanding and discussed that home health can be arranged.   Assessment/plan status:  No Further Intervention Required Other assessment/ plan:   Information/referral to community resources:   pt declining SNF    PATIENT'S/FAMILY'S RESPONSE TO PLAN OF CARE: Pt alert and oriented x 4. Pt expressed frustration as she does not want to be spoken to about SNF for rehab anymore. Pt reports support from pt family at home, but not family present to confirm. Pt adamant about not going to SNF for rehab upon discharge and plans to d/c home with daughter with [facility] and reportedly 24 hour care.    No further social work needs identified.    CSW signing off.    Please re-consult if social work needs  arise.   Alison Murray, MSW, LCSW Clinical Social Work [employer] [phone]

## 2014-09-24 ENCOUNTER — Other Ambulatory Visit: Payer: Self-pay | Admitting: Internal Medicine

## 2014-09-24 DIAGNOSIS — R0902 Hypoxemia: Secondary | ICD-10-CM | POA: Insufficient documentation

## 2014-09-24 DIAGNOSIS — Z7901 Long term (current) use of anticoagulants: Secondary | ICD-10-CM

## 2014-09-24 DIAGNOSIS — J4521 Mild intermittent asthma with (acute) exacerbation: Secondary | ICD-10-CM

## 2014-09-24 LAB — GLUCOSE, CAPILLARY
GLUCOSE-CAPILLARY: 164 mg/dL — AB (ref 70–99)
GLUCOSE-CAPILLARY: 152 mg/dL — AB (ref 70–99)
Glucose-Capillary: 119 mg/dL — ABNORMAL HIGH (ref 70–99)
Glucose-Capillary: 207 mg/dL — ABNORMAL HIGH (ref 70–99)

## 2014-09-24 LAB — CBC
HCT: 32.1 % — ABNORMAL LOW (ref 36.0–46.0)
HEMOGLOBIN: 9.8 g/dL — AB (ref 12.0–15.0)
MCH: 27.1 pg (ref 26.0–34.0)
MCHC: 30.5 g/dL (ref 30.0–36.0)
MCV: 88.9 fL (ref 78.0–100.0)
PLATELETS: 218 10*3/uL (ref 150–400)
RBC: 3.61 MIL/uL — AB (ref 3.87–5.11)
RDW: 16.4 % — ABNORMAL HIGH (ref 11.5–15.5)
WBC: 2.1 10*3/uL — ABNORMAL LOW (ref 4.0–10.5)

## 2014-09-24 LAB — BASIC METABOLIC PANEL
Anion gap: 6 (ref 5–15)
BUN: 15 mg/dL (ref 6–23)
CALCIUM: 7.8 mg/dL — AB (ref 8.4–10.5)
CHLORIDE: 99 mmol/L (ref 96–112)
CO2: 36 mmol/L — AB (ref 19–32)
Creatinine, Ser: 0.7 mg/dL (ref 0.50–1.10)
GFR calc Af Amer: 88 mL/min — ABNORMAL LOW (ref >90)
GFR calc non Af Amer: 76 mL/min — ABNORMAL LOW (ref >90)
GLUCOSE: 121 mg/dL — AB (ref 70–99)
Potassium: 3.2 mmol/L — ABNORMAL LOW (ref 3.5–5.1)
Sodium: 141 mmol/L (ref 135–145)

## 2014-09-24 MED ORDER — POTASSIUM CHLORIDE CRYS ER 20 MEQ PO TBCR
40.0000 meq | EXTENDED_RELEASE_TABLET | Freq: Two times a day (BID) | ORAL | Status: DC
  Administered 2014-09-24 – 2014-10-02 (×17): 40 meq via ORAL
  Filled 2014-09-24 (×19): qty 2

## 2014-09-24 MED ORDER — IPRATROPIUM-ALBUTEROL 0.5-2.5 (3) MG/3ML IN SOLN
3.0000 mL | Freq: Four times a day (QID) | RESPIRATORY_TRACT | Status: DC
  Administered 2014-09-24: 3 mL via RESPIRATORY_TRACT
  Filled 2014-09-24 (×2): qty 3

## 2014-09-24 MED ORDER — DICLOFENAC SODIUM 1 % TD GEL
2.0000 g | Freq: Three times a day (TID) | TRANSDERMAL | Status: DC | PRN
  Administered 2014-09-25 (×3): 2 g via TOPICAL
  Filled 2014-09-24: qty 100

## 2014-09-24 NOTE — Progress Notes (Signed)
Patient Name: [name] Date of Encounter: 09/24/2014  Principal Problem:   Acute on chronic combined systolic and diastolic heart failure Active Problems:   Diabetes   Glaucoma   Essential hypertension   Chronic combined systolic and diastolic congestive heart failure   Asthma in adult   Current use of long term anticoagulation--xarelto   Leukopenia   Length of Stay: 4  SUBJECTIVE  She is very somnolent. States that her  Breathing is on and off better.  CURRENT MEDS . brimonidine  1 drop Left Eye BID  . dorzolamide  1 drop Left Eye BID  . furosemide  80 mg Intravenous BID  . insulin aspart  0-9 Units Subcutaneous TID WC  . ipratropium-albuterol  3 mL Nebulization TID  . lisinopril  5 mg Oral Daily  . metoprolol succinate  100 mg Oral Daily  . mometasone-formoterol  2 puff Inhalation BID  . rivaroxaban  20 mg Oral Q supper  . sodium chloride  3 mL Intravenous Q12H  . sodium chloride  3 mL Intravenous Q12H  . spironolactone  12.5 mg Oral Daily    OBJECTIVE  Filed Vitals:   09/23/14 1947 09/23/14 2015 09/24/14 0558 09/24/14 0814  BP:  106/56 131/72   Pulse:  87 85   Temp:  98.4 F (36.9 C) 98.2 F (36.8 C)   TempSrc:  Oral Oral   Resp:  18 18   Height:      Weight:   179 lb 7.3 oz (81.4 kg)   SpO2: 90% 94% 95% 94%    Intake/Output Summary (Last 24 hours) at 09/24/14 0956 Last data filed at 09/24/14 0900  Gross per 24 hour  Intake    720 ml  Output   2300 ml  Net  -1580 ml   Filed Weights   09/22/14 0552 09/23/14 0439 09/24/14 0558  Weight: 181 lb 3.5 oz (82.2 kg) 181 lb 7 oz (82.3 kg) 179 lb 7.3 oz (81.4 kg)    PHYSICAL EXAM  General: Pleasant, NAD. Neuro: Alert and oriented X 3. Moves all extremities spontaneously. Psych: Normal affect. HEENT:  Normal  Neck: Supple without bruits or JVD. Lungs:  Resp regular and unlabored, CTA. Heart: RRR no s3, s4, or murmurs. Abdomen: Soft, non-tender, non-distended, BS + x 4.  Extremities: No clubbing,  cyanosis or edema. DP/PT/Radials 2+ and equal bilaterally.  Accessory Clinical Findings  CBC  Recent Labs  09/23/14 0337 09/24/14 0545  WBC 2.4* 2.1*  HGB 9.9* 9.8*  HCT 32.7* 32.1*  MCV 89.8 88.9  PLT 223 218   Basic Metabolic Panel  Recent Labs  09/23/14 0337 09/24/14 0545  NA 140 141  K 3.5 3.2*  CL 101 99  CO2 31 36*  GLUCOSE 103* 121*  BUN 16 15  CREATININE 0.71 0.70  CALCIUM 7.6* 7.8*   Dg Chest 2 View  09/20/2014   CLINICAL DATA:  81 year old female with hypoxia. History of atrial fibrillation.   IMPRESSION: 1. Cardiomegaly and pulmonary venous congestion, with resolution of previously noted mild interstitial pulmonary edema. 2. Small bilateral pleural effusions. 3. Atherosclerosis. 4. Additional incidental findings, similar prior studies, as above.   Electronically Signed   By: Trudie Reed M.D.   On: 09/20/2014 21:07   Dg Chest 2 View  09/05/2014   CLINICAL DATA:  Shortness of breath.    IMPRESSION: 1. Severe cardiomegaly again noted. 2. Findings consistent with mild congestive heart failure again noted. Similar finding noted on prior study 10/07/2013.   Electronically  Signed   By: Maisie Fus  Register   On: 09/05/2014 13:05   TELE: SR, frequent PVCs    ASSESSMENT AND PLAN  1. Hypoxia likely due to volume overload.  2. A/C Systolic /Diastolic HF  --EF 20-25% with moderate to severe RV dysfunction  3. Chronic A fib Chads Vasc Score 6  4. H/O PE 5. Immobility 6. Leuopenia 7. Acute on chronic respiratory Failure - On nocturnal oxygen.   Ms. Kitzmiller had a/c systolic HF with marked volume overload. Echo shows worsening biventricular failure. She appears quite frail and has been noncompliant with dietary restriction. ECG suggestive of ischemic cardiomyopathy but I do not think she is a good candidate for cardiac cath. I looked back at her office notes and she was in 165-167 lbs range in October and 179-185 since December. She has lost 6 lbs since admission  but still has fluid overload on physical exam. I would continue another day of iv diuresis. Reevaluate tomorrow. Crea stable and normal. Replace potassium.   Signed, Lars Masson MD, [facility] 09/24/2014

## 2014-09-24 NOTE — Progress Notes (Signed)
ANTICOAGULATION CONSULT NOTE - Initial Consult  Pharmacy Consult for Xarelto Indication: atrial fibrillation and h/o PE (08/2013) while on Xarelto  No Known Allergies  Patient Measurements: Height:  (165.1 cm) Weight: 179 lb 7.3 oz (81.4 kg) IBW/kg (Calculated) : 57  Vital Signs: Temp: 98.2 F (36.8 C) (02/07 0558) Temp Source: Oral (02/07 0558) BP: 131/72 mmHg (02/07 0558) Pulse Rate: 85 (02/07 0558)  Labs:  Recent Labs  09/22/14 0417 09/23/14 0337 09/24/14 0545  HGB 9.8* 9.9* 9.8*  HCT 32.1* 32.7* 32.1*  PLT 226 223 218  CREATININE 0.84 0.71 0.70    Estimated Creatinine Clearance: 53.2 mL/min (by C-G formula based on Cr of 0.7).   Medical History: Past Medical History  Diagnosis Date  . Hypertension   . Asthma   . Osteoarthritis     left shoulder  . Pseudogout   . Hypokalemia     a. Previously felt due to diuretics, req supplementation.  . Churg-Strauss syndrome     a. Sural nerve biopsy 02/2000.  Darene Lamer disease     a. s/p thyroidectomy.  . Glaucoma   . Hypercholesteremia   . Venous insufficiency   . Osteopenia     DEXA 2004  . Lipoma     left inner thigh  . Cataract     left eye  . VASCULITIS   . Chronic combined systolic and diastolic heart failure     a. 08/6107 EF:20-25%  . Pleural effusion, left     a. Thoracentesis 2001 - per notes, no malignant cells, was transudative.  Marland Kitchen NSVT (nonsustained ventricular tachycardia)     a. During CHF adm 2001.  . Multifocal atrial tachycardia     a. Documented as OP 01/2012.  Marland Kitchen Ectopic atrial tachycardia     a. Documented on tele as IP 02/2012.  . Pulmonary HTN     a. Mod by echo 02/2012.  . Valvular heart disease     a. Echo 02/2012: mod MR/TR, mild AI.  Marland Kitchen Pericardial effusion     a. Echo 02/2012: small-mod pericardial effusion.  . Atrial fibrillation, permanent     a. Dx 04/2013, on metoprolol, dig and xarelto  . Shortness of breath   . CHF (congestive heart failure)   . On home oxygen therapy      "wear it mostly at night" (09/20/2014)  . Pneumonia 1930's  . Type II diabetes mellitus     Medications:  Prescriptions prior to admission  Medication Sig Dispense Refill Last Dose  . acetaminophen (TYLENOL) 500 MG tablet Take 1,000 mg by mouth every 6 (six) hours as needed (leg pain).   Past Week at Unknown time  . albuterol (PROVENTIL HFA;VENTOLIN HFA) 108 (90 BASE) MCG/ACT inhaler Inhale 1-2 puffs into the lungs every 6 (six) hours as needed for shortness of breath. 18 g 11 unk  . brimonidine (ALPHAGAN P) 0.1 % SOLN Place 1 drop into the left eye every 12 (twelve) hours. 15 mL 1 Past Week at Unknown time  . dorzolamide (TRUSOPT) 2 % ophthalmic solution Place 1 drop into the left eye 2 (two) times daily.    Past Week at Unknown time  . furosemide (LASIX) 80 MG tablet Take 1-1.5 tablets (80-120 mg total) by mouth 2 (two) times daily. Take 1.5 tabs in morning, and 1 tab in the evening. 180 tablet 3 Past Week at Unknown time  . lisinopril (PRINIVIL,ZESTRIL) 5 MG tablet TAKE 1 TABLET BY MOUTH EVERY DAY 30 tablet 3 Past Week at  Unknown time  . metFORMIN (GLUCOPHAGE) 1000 MG tablet TAKE 1 TABLET BY MOUTH TWICE DAILY WITH A MEAL 60 tablet 3 Past Week at Unknown time  . metoprolol succinate (TOPROL-XL) 100 MG 24 hr tablet TAKE 1 TABLET BY MOUTH DAILY WITH OR IMMEDIATELY FOLLOWING A MEAL 30 tablet 3 Past Week at unk  . mometasone-formoterol (DULERA) 100-5 MCG/ACT AERO Inhale 2 puffs into the lungs 2 (two) times daily. 8.8 g 3 Past Week at Unknown time  . potassium chloride SA (K-DUR,KLOR-CON) 20 MEQ tablet TAKE 2 TABLETS BY MOUTH DAILY 60 tablet 0 Past Week at Unknown time  . XARELTO 15 MG TABS tablet TAKE 1 TABLET BY MOUTH DAILY WITH SUPPER 30 tablet 0 Past Week at Unknown time    Assessment: 81 y.o. female presents with leg pain and swelling. Pt on Xarelto 15mg  daily PTA for afib and h/o PE 08/2013. Noted that pt was on Xarelto 15mg  daily when developed PE. Appears that sometime over the past year,  pt was switched to 15mg  daily for afib alone (CrCl 15-50 ml/min). MD would like correct dosing for h/o PE and afib so will change to 20mg  daily (no dose adjustments necessary when using for h/o PE unless CrCl < 30 ml/min. Estimated CrCl 53 ml/min. Scr remains stable. H/H remains stable.   Goal of Therapy:  Prevention of CVA and VTE Monitor platelets by anticoagulation protocol: Yes   Plan:   Xarelto 20mg  daily  Will f/u renal function, s/s bleeding  Ulyses Southward, PharmD Pager: [phone] 09/24/2014 7:53 AM

## 2014-09-24 NOTE — Progress Notes (Signed)
Subjective: [name].  [name] was seen and examined.  She says she is feeling better.  Appetite good.  Breathing okay but still on 4L.  Her main complaint is pain on the top of both feet.  Legs are still painful as well. Pain does respond to oral meds.   Objective: Vital signs in last 24 hours: Filed Vitals:   09/23/14 1947 09/23/14 2015 09/24/14 0558 09/24/14 0814  BP:  106/56 131/72   Pulse:  87 85   Temp:  98.4 F (36.9 C) 98.2 F (36.8 C)   TempSrc:  Oral Oral   Resp:  18 18   Height:      Weight:   81.4 kg (179 lb 7.3 oz)   SpO2: 90% 94% 95% 94%   Weight change: -0.9 kg (-1 lb 15.8 oz)  Intake/Output Summary (Last 24 hours) at 09/24/14 0854 Last data filed at 09/24/14 0643  Gross per 24 hour  Intake    840 ml  Output   2300 ml  Net  -1460 ml   Gen: A&O x 4, no acute distress, resting in bed on nasal cannula HEENT: [place]/AT, EOMI, moist mucous membranes Heart: Irregularly irregular, no murmurs, rubs, or gallops Lungs: + wheezing B/L, no rales or rhonchi, respirations unlabored while on nasal cannula Abd: Soft, non-tender, non-distended, + bowel sounds Ext: 2+ b/l pitting edema up to knees, L>R, evidence of chronic venous stasis b/l, skin is very dry   Lab Results: Basic Metabolic Panel:  Recent Labs Lab 09/23/14 0337 09/24/14 0545  NA 140 141  K 3.5 3.2*  CL 101 99  CO2 31 36*  GLUCOSE 103* 121*  BUN 16 15  CREATININE 0.71 0.70  CALCIUM 7.6* 7.8*    CBC:  Recent Labs Lab 09/20/14 1654  09/23/14 0337 09/24/14 0545  WBC 1.8*  < > 2.4* 2.1*  NEUTROABS 0.9*  --   --   --   HGB 10.9*  < > 9.9* 9.8*  HCT 35.8*  < > 32.7* 32.1*  MCV 89.7  < > 89.8 88.9  PLT 230  < > 223 218  < > = values in this interval not displayed. CBG:  Recent Labs Lab 09/22/14 1636 09/23/14 0555 09/23/14 1225 09/23/14 1628 09/23/14 2111 09/24/14 0635  GLUCAP 159* 112* 144* 157* 171* 119*   BNP 550  TTE:  - Left ventricle: The cavity size was normal. There was  mild concentric hypertrophy. Systolic function was severely reduced. The estimated ejection fraction was in the range of 20% to 25%. Severe diffuse hypokinesis with no identifiable regional variations. - Ventricular septum: Septal motion showed abnormal function, dyssynergy, and paradox. The contour showed diastolic flattening. These changes are consistent with RV volume overload. - Aortic valve: There was mild regurgitation directed centrally in the LVOT. - Mitral valve: There was mild to moderate regurgitation directed centrally. The acceleration rate of the regurgitant jet was reduced, consistent with a low dP/dt. - Left atrium: The atrium was moderately to severely dilated. - Right ventricle: The cavity size was moderately dilated. Systolic function was moderately to severely reduced. - Right atrium: The atrium was severely dilated. - Atrial septum: A patent foramen ovale cannot be excluded. - Tricuspid valve: There was malcoaptation of the valve leaflets. There was severe regurgitation directed centrally. - Pulmonary arteries: Systolic pressure was moderately increased. PA peak pressure: 54 mm Hg (S). - Pericardium, extracardiac: A small, free-flowing pericardial effusion was identified along the right atrial free wall. There was a right  pleural effusion. There was a left pleural effusion.  Medications: I have reviewed the patient's current medications. Scheduled Meds: . brimonidine  1 drop Left Eye BID  . dorzolamide  1 drop Left Eye BID  . furosemide  80 mg Intravenous BID  . insulin aspart  0-9 Units Subcutaneous TID WC  . ipratropium-albuterol  3 mL Nebulization TID  . lisinopril  5 mg Oral Daily  . metoprolol succinate  100 mg Oral Daily  . mometasone-formoterol  2 puff Inhalation BID  . rivaroxaban  20 mg Oral Q supper  . sodium chloride  3 mL Intravenous Q12H  . sodium chloride  3 mL Intravenous Q12H  . spironolactone  12.5 mg Oral Daily    Continuous Infusions:  PRN Meds:.HYDROcodone-acetaminophen, ipratropium-albuterol   Assessment/Plan:  AoC CHF: weight is down 9 pounds since admission.  Still edematous but decreased from 3+.  I&Os are inaccurate.  Foley would provide more accurate measure of output but want to avoid due to risk of infection and other objective measures seem to show slow but steady improvement in volume status.  Right leg swelling has improved more than left (left appears larger) and I think this is due to hx of DVT in left leg.  DVT would be unlikely since she is on Xarelto, pain is B/L and edema seems more likely due to current exacerbation of HF.  Appreciate cardiology recs.   -continue diuresis with lasix 80 mg iv bid -cont toprol-xl 100 mg daily, lisinopril 5 mg daily -RN to wean O2 to keep sat >92% -PT/OT -strict I&O -daily weights -norco 5-325 mg po q8hprn for severe leg pain; will add voltaren gel as well  -consider lower extremity doppler if worsening or unilateral pain  Anemia: stable. No signs of bleeding.  Anemia panel c/w Fe deficiency.   -FOBT pending  Asthma: wheezing on exam  - increase frequency of albuterol 2.5 mg neb from q8h to q6h - continue Dulera 2 puff inh bid and oxygen supplementation  Pulmonary HTN: [name]'s PA 54 mm Hg is improved from 61 mmHg in 03/2014.There were pulmonary vascular changes noted on CXR. Cardiology feels she is not a candidate for cardiac catheterization. Goals of care discussion needed.  -appreciate cards  Atrial Fibrillation: She was on xarelto 15 mg which was increased to 20 mg on admission. She is rate controlled with toprol-xl 100 mg daily at home.  - cont xarelto 20 mg daily and toprol-xl 100 mg daily  DM2: Last A1c 7.6 on 1/129/16. She takes metformin 1000 mg bid at home.  AM CBG 119. -hold metformin 1000 mg bid -continue SSI-sensitive  HTN: stable. At home she is on lisinopril 5 mg daily, toprol-xl 100 mg daily, and lasix 120 mg am, 80 mg  pm. -cont lisinopril 5 mg daily, toprol-xl 100 mg daily -lasix 80 mg iv bid as noted above  Leukopenia: WBC 2.1 this morning. WBC has ranged from 1.8 on presentation to 2.5. This appears to be stable near her baseline for the past year. Differential distribution on admission 51% neutrophil, 29% lymphocyte, 16% monocyte, 3% eosinophil, 1% basophil.  Dispo: Disposition is deferred at this time, awaiting improvement of current medical problems.  Anticipated discharge in approximately 1-2 day(s).   The patient does have a current PCP Wendy Manges, DO) and does need an [facility] hospital follow-up appointment after discharge.  The patient does not know have transportation limitations that hinder transportation to clinic appointments.  .Services Needed at time of discharge: Y =  Yes, Blank = No PT:   OT:   RN:   Equipment:   Other:     LOS: 4 days   Wendy Manges, DO 09/24/2014, 8:54 AM

## 2014-09-25 ENCOUNTER — Ambulatory Visit: Payer: Medicaid Other | Admitting: Cardiology

## 2014-09-25 ENCOUNTER — Inpatient Hospital Stay (HOSPITAL_COMMUNITY): Payer: Medicare Other

## 2014-09-25 LAB — BASIC METABOLIC PANEL
ANION GAP: 6 (ref 5–15)
BUN: 15 mg/dL (ref 6–23)
CALCIUM: 7.9 mg/dL — AB (ref 8.4–10.5)
CO2: 35 mmol/L — ABNORMAL HIGH (ref 19–32)
Chloride: 98 mmol/L (ref 96–112)
Creatinine, Ser: 0.74 mg/dL (ref 0.50–1.10)
GFR calc Af Amer: 87 mL/min — ABNORMAL LOW (ref >90)
GFR, EST NON AFRICAN AMERICAN: 75 mL/min — AB (ref >90)
GLUCOSE: 189 mg/dL — AB (ref 70–99)
Potassium: 3.6 mmol/L (ref 3.5–5.1)
SODIUM: 139 mmol/L (ref 135–145)

## 2014-09-25 LAB — CBC
HEMATOCRIT: 33.4 % — AB (ref 36.0–46.0)
HEMOGLOBIN: 10.1 g/dL — AB (ref 12.0–15.0)
MCH: 27.4 pg (ref 26.0–34.0)
MCHC: 30.2 g/dL (ref 30.0–36.0)
MCV: 90.8 fL (ref 78.0–100.0)
PLATELETS: 225 10*3/uL (ref 150–400)
RBC: 3.68 MIL/uL — ABNORMAL LOW (ref 3.87–5.11)
RDW: 16.4 % — AB (ref 11.5–15.5)
WBC: 3.3 10*3/uL — ABNORMAL LOW (ref 4.0–10.5)

## 2014-09-25 LAB — OCCULT BLOOD X 1 CARD TO LAB, STOOL: Fecal Occult Bld: POSITIVE — AB

## 2014-09-25 LAB — GLUCOSE, CAPILLARY: Glucose-Capillary: 111 mg/dL — ABNORMAL HIGH (ref 70–99)

## 2014-09-25 MED ORDER — HYDROXYZINE HCL 10 MG PO TABS
10.0000 mg | ORAL_TABLET | Freq: Three times a day (TID) | ORAL | Status: DC | PRN
  Filled 2014-09-25: qty 1

## 2014-09-25 MED ORDER — AMIODARONE HCL IN DEXTROSE 360-4.14 MG/200ML-% IV SOLN
60.0000 mg/h | INTRAVENOUS | Status: AC
  Administered 2014-09-25 (×2): 60 mg/h via INTRAVENOUS
  Filled 2014-09-25: qty 200

## 2014-09-25 MED ORDER — ALBUTEROL SULFATE (2.5 MG/3ML) 0.083% IN NEBU
5.0000 mg | INHALATION_SOLUTION | Freq: Four times a day (QID) | RESPIRATORY_TRACT | Status: DC
  Administered 2014-09-25 (×2): 5 mg via RESPIRATORY_TRACT
  Filled 2014-09-25 (×2): qty 6

## 2014-09-25 MED ORDER — ALBUTEROL SULFATE (2.5 MG/3ML) 0.083% IN NEBU
5.0000 mg | INHALATION_SOLUTION | Freq: Four times a day (QID) | RESPIRATORY_TRACT | Status: DC | PRN
  Administered 2014-09-26: 5 mg via RESPIRATORY_TRACT
  Filled 2014-09-25: qty 6

## 2014-09-25 MED ORDER — SPIRONOLACTONE 25 MG PO TABS
25.0000 mg | ORAL_TABLET | Freq: Every day | ORAL | Status: DC
  Administered 2014-09-25 – 2014-10-02 (×8): 25 mg via ORAL
  Filled 2014-09-25 (×8): qty 1

## 2014-09-25 MED ORDER — AMIODARONE LOAD VIA INFUSION
150.0000 mg | Freq: Once | INTRAVENOUS | Status: AC
  Administered 2014-09-25: 150 mg via INTRAVENOUS
  Filled 2014-09-25: qty 83.34

## 2014-09-25 MED ORDER — POTASSIUM CHLORIDE CRYS ER 20 MEQ PO TBCR
40.0000 meq | EXTENDED_RELEASE_TABLET | Freq: Once | ORAL | Status: AC
  Administered 2014-09-25: 40 meq via ORAL

## 2014-09-25 MED ORDER — AMIODARONE HCL IN DEXTROSE 360-4.14 MG/200ML-% IV SOLN
30.0000 mg/h | INTRAVENOUS | Status: DC
  Administered 2014-09-26: 30 mg/h via INTRAVENOUS
  Filled 2014-09-25 (×2): qty 200

## 2014-09-25 MED ORDER — POTASSIUM CHLORIDE CRYS ER 20 MEQ PO TBCR: 60.0000 meq | EXTENDED_RELEASE_TABLET | Freq: Once | ORAL | Status: DC

## 2014-09-25 NOTE — Progress Notes (Signed)
Physical Therapy Treatment Patient Details Name: [name] MRN: [id number] DOB: May 24, 1926 Today's Date: 09/25/2014    History of Present Illness 81 yo female with onset of LLE pain and LE edema, discovered pleural effusion, CHF, cardiomegaly and hypoxia on admission.    PT Comments    Pt sat on EOB x 10 minutes, +2 assist for bed to recliner transfer. Standing tolerance with RW limited by LLE pain. Pain medication requested. SaO2 91% on 4L O2 with activity, HR 113. 2/4 dyspnea.  Follow Up Recommendations  SNF     Equipment Recommendations  None recommended by PT    Recommendations for Other Services       Precautions / Restrictions Precautions Precautions: Fall Precaution Comments: monitor O2 Restrictions Weight Bearing Restrictions: No Other Position/Activity Restrictions: However pt's lower legs and feet are really sore due to retaining fluid    Mobility  Bed Mobility Overal bed mobility: Needs Assistance Bed Mobility: Supine to Sit;Rolling;Sidelying to Sit Rolling: Max assist;+2 for physical assistance Sidelying to sit: Max assist;+2 for physical assistance       General bed mobility comments: assist to initiate roll and to raise trunk and bring legs over EOB, pt 50%  Transfers Overall transfer level: Needs assistance Equipment used: Rolling walker (2 wheeled) Transfers: Sit to/from Stand Sit to Stand: +2 physical assistance;Max assist Stand pivot transfers: +2 physical assistance;Max assist       General transfer comment: Verbal cues for hand placement. Assist to bring hips up. Used rolling walker for pivot with pt having difficulty taking pivotal steps especially with left leg.  Ambulation/Gait Ambulation/Gait assistance: +2 physical assistance;Mod assist Ambulation Distance (Feet): 3 Feet Assistive device: Rolling walker (2 wheeled) Gait Pattern/deviations: Step-to pattern;Decreased step length - right;Decreased step length - left;Decreased weight  shift to left;Antalgic   Gait velocity interpretation: Below normal speed for age/gender General Gait Details: Pt with difficulty advancing LLE and also bearing weight on LLE.   Stairs            Wheelchair Mobility    Modified Rankin (Stroke Patients Only)       Balance   Sitting-balance support: Feet supported;Single extremity supported Sitting balance-Leahy Scale: Fair Sitting balance - Comments: pt sat on EOB x 10 minutes     Standing balance-Leahy Scale: Poor Standing balance comment: pt only able to stand with RW approximately 20 seconds due to LLE pain, pt stands with trunk flexed, unable to come to full upright position with physical assist/verbal cues                    Cognition Arousal/Alertness: Awake/alert Behavior During Therapy: WFL for tasks assessed/performed Overall Cognitive Status: No family/caregiver present to determine baseline cognitive functioning Area of Impairment: Memory     Memory: Decreased short-term memory              Exercises      General Comments        Pertinent Vitals/Pain Pain Score: 9  Pain Location: LLE Pain Descriptors / Indicators: Sore Pain Intervention(s): Patient requesting pain meds-RN notified;Monitored during session;Limited activity within patient's tolerance;Repositioned    Home Living                      Prior Function            PT Goals (current goals can now be found in the care plan section) Acute Rehab PT Goals Patient Stated Goal: pt really wants to go home PT Goal  Formulation: With patient Time For Goal Achievement: 10/05/14 Potential to Achieve Goals: Fair Progress towards PT goals: Not progressing toward goals - comment (LLE pain limiting standing tolerance)    Frequency  Min 3X/week    PT Plan Current plan remains appropriate    Co-evaluation             End of Session Equipment Utilized During Treatment: Gait belt;Oxygen Activity Tolerance: Patient  limited by pain;Patient limited by fatigue Patient left: in chair;with call bell/phone within reach;with chair alarm set     Time: [phone] PT Time Calculation (min) (ACUTE ONLY): 30 min  Charges:  $Therapeutic Activity: 23-37 mins                    G Codes:      Tamala Ser 09/25/2014, 9:31 AM  [phone]

## 2014-09-25 NOTE — Progress Notes (Signed)
Occupational Therapy Treatment Patient Details Name: [name] MRN: [id number] DOB: 10/16/1925 Today's Date: 09/25/2014    History of present illness 81 yo female with onset of LLE pain and LE edema, discovered pleural effusion, CHF, cardiomegaly and hypoxia on admission.   OT comments  This 81 yo female not making progress since the eval due to continued pain in Bil LEs (left worse than right) and generalized weakness. Pt states that she will be going home with her daughter and that she will be helping her--just question if daughter is aware how much more A she will need (+2 for mobility). We will continue to follow.  Follow Up Recommendations  SNF;Supervision/Assistance - 24 hour    Equipment Recommendations   (If home pt will need a hoyer lift unless she has +2 A at home)       Precautions / Restrictions Precautions Precautions: Fall Precaution Comments: monitor O2 Restrictions Weight Bearing Restrictions: No Other Position/Activity Restrictions: However pt's lower legs and feet are really sore due to retaining fluid and crepitus can be heard in Bil knees       Mobility Bed Mobility               General bed mobility comments: Pt up in recliner upon arrival  Transfers Overall transfer level: Needs assistance Equipment used: Rolling walker (2 wheeled) (Stedy, then potty chair in front of her) Transfers: Sit to/from Stand Sit to Stand: Max assist (once we found a technique that would work for her, but then could not get totally upright)         General transfer comment: Pt max A to scoot forward in chair having to A her with pad under her. Once at [facility] we attempted sit>stand with pt pushing up from recliner with Bil UEs and did not even remotely clear bottom off of recliner--even with cues for pt to stay leaning forward (tendency is to push backwards instead).  So then tried using the Stedy to see if she would do better pushing up v. pushing up--this did not work  either even though she had to lean forward to grab onto bar of Stedy she still tended to push backwards, instead of pulling forward. Finally I tried by placing  the 3n1 in front of her with me sitting on it, having her reach and grab onto both arms of the 3n1, running her hands 1/3 way along the arms which got her forward enough that she did not push backwards to stand up--so she stood up x 3 in this manner but could not get fully upright and could only maintain for 10 seconds at the most each time.     Balance Overall balance assessment: Needs assistance Sitting-balance support: Feet supported;Bilateral upper extremity supported Sitting balance-Leahy Scale: Fair                             ADL                                         General ADL Comments: Pt up in recliner, did not need to use bathroom and was not ready to get back to bed so we just worked on scooting forward in recliner,leaning forward, and sit<>stand                Cognition   Behavior During Therapy: [facility]  for tasks assessed/performed Overall Cognitive Status: No family/caregiver present to determine baseline cognitive functioning                                    Pertinent Vitals/ Pain       Pain Assessment: Faces Pain Score: 5  Pain Location: Bil knees (left worse than right) Pain Descriptors / Indicators: Aching;Sore Pain Intervention(s): Monitored during session;Repositioned         Frequency Min 2X/week     Progress Toward Goals  OT Goals(current goals can now be found in the care plan section)  Progress towards OT goals: Not progressing toward goals - comment (due to continued pain in LEs and genralized weakness)     Plan Discharge plan remains appropriate       End of Session Equipment Utilized During Treatment: Gait belt;Oxygen (4 liters)   Activity Tolerance Patient limited by fatigue;Patient limited by pain   Patient Left in chair;with call  bell/phone within reach;with chair alarm set           Time: 0045-9977 OT Time Calculation (min): 22 min  Charges: OT General Charges $OT Visit: 1 Procedure OT Treatments $Therapeutic Activity: 8-22 mins  Evette Georges [phone] 09/25/2014, 4:02 PM

## 2014-09-25 NOTE — Progress Notes (Signed)
Subjective: [name].  [name] was seen and examined.  She was sitting upright on the side of the bed with PT. She says she continues to feel better. She complained to PT about her L leg hurting but did not mention this to the rounding team. She says the norco is helping with pain. Breathing okay but still on 4L.   Objective: Vital signs in last 24 hours: Filed Vitals:   09/24/14 2136 09/25/14 0528 09/25/14 0924 09/25/14 0951  BP:  121/72  126/82  Pulse:  84 103 102  Temp:  98 F (36.7 C)    TempSrc:  Oral    Resp:  20  20  Height:      Weight:  178 lb 2.1 oz (80.8 kg)    SpO2: 90% 97% 92% 97%   Weight change: -1 lb 5.2 oz (-0.6 kg)  Intake/Output Summary (Last 24 hours) at 09/25/14 1008 Last data filed at 09/25/14 0848  Gross per 24 hour  Intake   1563 ml  Output   2350 ml  Net   -787 ml   Gen: A&O x 4, no acute distress, sitting upright on side of bed on nasal cannula HEENT: [place]/AT, EOMI, moist mucous membranes Heart: Irregularly irregular, no murmurs, rubs, or gallops Lungs: + wheezing B/L, no rales or rhonchi, respirations unlabored while on nasal cannula Abd: Soft, non-tender, non-distended, + bowel sounds Ext: 1-2+ b/l pitting edema up to knees, L>R, evidence of chronic venous stasis b/l, skin is very dry   Lab Results: Basic Metabolic Panel:  Recent Labs Lab 09/23/14 0337 09/24/14 0545  NA 140 141  K 3.5 3.2*  CL 101 99  CO2 31 36*  GLUCOSE 103* 121*  BUN 16 15  CREATININE 0.71 0.70  CALCIUM 7.6* 7.8*    CBC:  Recent Labs Lab 09/20/14 1654  09/23/14 0337 09/24/14 0545  WBC 1.8*  < > 2.4* 2.1*  NEUTROABS 0.9*  --   --   --   HGB 10.9*  < > 9.9* 9.8*  HCT 35.8*  < > 32.7* 32.1*  MCV 89.7  < > 89.8 88.9  PLT 230  < > 223 218  < > = values in this interval not displayed. CBG:  Recent Labs Lab 09/23/14 2111 09/24/14 0635 09/24/14 1103 09/24/14 1638 09/24/14 2008 09/25/14 0624  GLUCAP 171* 119* 164* 207* 152* 111*   BNP 550  TTE:  -  Left ventricle: The cavity size was normal. There was mild concentric hypertrophy. Systolic function was severely reduced. The estimated ejection fraction was in the range of 20% to 25%. Severe diffuse hypokinesis with no identifiable regional variations. - Ventricular septum: Septal motion showed abnormal function, dyssynergy, and paradox. The contour showed diastolic flattening. These changes are consistent with RV volume overload. - Aortic valve: There was mild regurgitation directed centrally in the LVOT. - Mitral valve: There was mild to moderate regurgitation directed centrally. The acceleration rate of the regurgitant jet was reduced, consistent with a low dP/dt. - Left atrium: The atrium was moderately to severely dilated. - Right ventricle: The cavity size was moderately dilated. Systolic function was moderately to severely reduced. - Right atrium: The atrium was severely dilated. - Atrial septum: A patent foramen ovale cannot be excluded. - Tricuspid valve: There was malcoaptation of the valve leaflets. There was severe regurgitation directed centrally. - Pulmonary arteries: Systolic pressure was moderately increased. PA peak pressure: 54 mm Hg (S). - Pericardium, extracardiac: A small, free-flowing pericardial effusion was identified  along the right atrial free wall. There was a right pleural effusion. There was a left pleural effusion.  Medications: I have reviewed the patient's current medications. Scheduled Meds: . albuterol  5 mg Nebulization Q6H  . brimonidine  1 drop Left Eye BID  . dorzolamide  1 drop Left Eye BID  . furosemide  80 mg Intravenous BID  . insulin aspart  0-9 Units Subcutaneous TID WC  . lisinopril  5 mg Oral Daily  . metoprolol succinate  100 mg Oral Daily  . mometasone-formoterol  2 puff Inhalation BID  . potassium chloride  40 mEq Oral BID  . potassium chloride  60 mEq Oral Once  . rivaroxaban  20 mg Oral Q supper    . sodium chloride  3 mL Intravenous Q12H  . sodium chloride  3 mL Intravenous Q12H  . spironolactone  25 mg Oral Daily   Continuous Infusions:  PRN Meds:.diclofenac sodium, HYDROcodone-acetaminophen   Assessment/Plan:  AoC CHF: weight is down 10 pounds since admission.  Still edematous but decreased from 3+ with daily improvement.  I&Os are inaccurate.  Want to avoid Foley due to risk of infection and other objective measures seem to show slow but steady improvement in volume status.  Right leg swelling has improved more than left (left appears larger) and may be due to hx of DVT in left leg.  DVT would be unlikely since she is on Xarelto, pain is B/L and edema seems more likely due to current exacerbation of HF.  -appreciate cardiology recs -continue diuresis with lasix 80 mg iv bid -cont toprol-xl 100 mg daily, lisinopril 5 mg daily -increase spironolactone to 25 mg daily -KDur 40 mEq po bid -consider transition to po diuresis tomorrow -RN to wean O2 to keep sat >92% -PT/OT -strict I&O -daily weights -norco 5-325 mg po q8hprn for severe leg pain; will add voltaren gel as well  -consider lower extremity doppler if worsening  Anemia: stable. No signs of bleeding.  Anemia panel c/w Fe deficiency.   -FOBT pending  Asthma: wheezing on exam  - cont albuterol 2.5 mg neb q6h scheduled - continue Dulera 2 puff inh bid and oxygen supplementation  Pulmonary HTN: [name]'s PA 54 mm Hg is improved from 61 mmHg in 03/2014.There were pulmonary vascular changes noted on CXR. Cardiology feels she is not a candidate for cardiac catheterization. Goals of care discussion needed.  -appreciate cards  Atrial Fibrillation: She was on xarelto 15 mg which was increased to 20 mg on admission. She is rate controlled with toprol-xl 100 mg daily at home.  - cont xarelto 20 mg daily and toprol-xl 100 mg daily  DM2: Last A1c 7.6 on 1/129/16. She takes metformin 1000 mg bid at home.  AM CBG 111 -hold  metformin 1000 mg bid -continue SSI-sensitive  HTN: stable. At home she is on lisinopril 5 mg daily, toprol-xl 100 mg daily, and lasix 120 mg am, 80 mg pm. -cont lisinopril 5 mg daily, toprol-xl 100 mg daily -lasix 80 mg iv bid as noted above -increase spironolactone to 25 mg po daily  Leukopenia: WBC 2.1 yesterday, am CBC pending. WBC has ranged from 1.8 on presentation to 2.5. This appears to be stable near her baseline for the past year. Differential distribution on admission 51% neutrophil, 29% lymphocyte, 16% monocyte, 3% eosinophil, 1% basophil.  Dispo: Disposition is deferred at this time, awaiting improvement of current medical problems.  Anticipated discharge in approximately 1-2 day(s).   The patient does have a  current PCP Yolanda Manges, DO) and does need an [facility] hospital follow-up appointment after discharge.  The patient does not know have transportation limitations that hinder transportation to clinic appointments.  .Services Needed at time of discharge: Y = Yes, Blank = No PT:   OT:   RN:   Equipment:   Other:     LOS: 5 days   Lorenda Hatchet, MD 09/25/2014, 10:08 AM

## 2014-09-25 NOTE — Progress Notes (Signed)
Pt report to RN of blood in sputum; RN assessed pt sputum which was thick brownish blood tinged sputum. MD paged x2; Dr. Andrey Campanile returns page call back and MD also notified of pt running Afib and tachy on monitor. MD said she will order CXR and new order for pt. Will continue to monitor pt quietly. Pt denies any pain and sitting comfortably in chair with family at side and call light within reach. Arabella Merles Macil Crady RN.

## 2014-09-25 NOTE — Progress Notes (Signed)
Advanced Heart Failure Rounding Note   Subjective:     Over the weekend she continued to diuresed with IV lasix. Weight down another day. Overall weight down 10 pounds. Denies dyspnea.  Creatinine 0.70    Objective:   Weight Range:  Vital Signs:   Temp:  [98 F (36.7 C)-98.7 F (37.1 C)] 98 F (36.7 C) (02/08 0528) Pulse Rate:  [69-84] 84 (02/08 0528) Resp:  [18-20] 20 (02/08 0528) BP: (104-121)/(52-72) 121/72 mmHg (02/08 0528) SpO2:  [90 %-97 %] 97 % (02/08 0528) Weight:  [178 lb 2.1 oz (80.8 kg)] 178 lb 2.1 oz (80.8 kg) (02/08 0528) Last BM Date: 09/21/14  Weight change: Filed Weights   09/23/14 0439 09/24/14 0558 09/25/14 0528  Weight: 181 lb 7 oz (82.3 kg) 179 lb 7.3 oz (81.4 kg) 178 lb 2.1 oz (80.8 kg)    Intake/Output:   Intake/Output Summary (Last 24 hours) at 09/25/14 0739 Last data filed at 09/25/14 7829  Gross per 24 hour  Intake   1083 ml  Output   3050 ml  Net  -1967 ml     Physical Exam:  General: Elderly No resp difficulty  HEENT: normal  Neck: supple. JVP ~10. Carotids 2+ bilat; no bruits. No lymphadenopathy or thryomegaly appreciated.  Cor: PMI nondisplaced. Irregular rate & rhythm. 2/6 SEM at apex  Lungs: clear  Abdomen: soft, nontender, nondistended. No hepatosplenomegaly. No bruits or masses. Good bowel sounds.  Extremities: no cyanosis, clubbing, rash, R and LLE 1-2+ edema  Neuro: alert & orientedx3, cranial nerves grossly intact. moves all 4 extremities w/o difficulty. Affect pleasant   Telemetry: AF  Labs: Basic Metabolic Panel:  Recent Labs Lab 09/20/14 1654 09/21/14 0455 09/22/14 0417 09/23/14 0337 09/24/14 0545  NA 137 138 138 140 141  K 4.0 3.6 3.5 3.5 3.2*  CL 100 101 100 101 99  CO2 28 30 33* 31 36*  GLUCOSE 132* 111* 144* 103* 121*  BUN CREATININE 0.91 0.90 0.84 0.71 0.70  CALCIUM 8.0* 7.8* 7.9* 7.6* 7.8*    Liver Function Tests:  Recent Labs Lab 09/20/14 1654  AST 20  ALT 7  ALKPHOS 110   BILITOT 0.9  PROT 6.8  ALBUMIN 3.7   No results for input(s): LIPASE, AMYLASE in the last 168 hours. No results for input(s): AMMONIA in the last 168 hours.  CBC:  Recent Labs Lab 09/20/14 1654 09/21/14 0455 09/21/14 1330 09/22/14 0417 09/23/14 0337 09/24/14 0545  WBC 1.8* 2.5* 2.5* 2.1* 2.4* 2.1*  NEUTROABS 0.9*  --   --   --   --   --   HGB 10.9* 8.9* 10.5* 9.8* 9.9* 9.8*  HCT 35.8* 27.7* 33.3* 32.1* 32.7* 32.1*  MCV 89.7 86.8 88.6 88.9 89.8 88.9  PLT 230 221 243 226 223 218    Cardiac Enzymes: No results for input(s): CKTOTAL, CKMB, CKMBINDEX, TROPONINI in the last 168 hours.  BNP: BNP (last 3 results)  Recent Labs  09/05/14 0955 09/20/14 1654  BNP 779.3* 549.9*    ProBNP (last 3 results)  Recent Labs  12/14/13 1846 02/17/14 1350 04/05/14 0855  PROBNP 4874.0* 3166.0* 4201.0*      Other results:    Imaging: No results found.   Medications:     Scheduled Medications: . brimonidine  1 drop Left Eye BID  . dorzolamide  1 drop Left Eye BID  . furosemide  80 mg Intravenous BID  . insulin aspart  0-9 Units Subcutaneous TID WC  .  lisinopril  5 mg Oral Daily  . metoprolol succinate  100 mg Oral Daily  . mometasone-formoterol  2 puff Inhalation BID  . potassium chloride  40 mEq Oral BID  . rivaroxaban  20 mg Oral Q supper  . sodium chloride  3 mL Intravenous Q12H  . sodium chloride  3 mL Intravenous Q12H  . spironolactone  12.5 mg Oral Daily    Infusions:    PRN Medications: diclofenac sodium, HYDROcodone-acetaminophen   Assessment:   1. Hypoxia likely due to volume overload.  2. A/C Systolic /Diastolic HF  --EF 20-25% with moderate to severe RV dysfunction  3. Chronic A fib Chads Vasc Score 6  4. H/O PE  5. Immobility  6. Leuopenia  7. Acute on chronic respiratory Failure - On nocturnal oxygen.  8. Hypokalemia   Plan/Discussion:    Diuresing well.  Renal function stable. Would continue IV lasix another day. Continue current  dose of ACE and b-blocker. Increase spiro to 25 mg daily.  Echo shows worsening biventricular failure.  ECG suggestive of ischemic cardiomyopathy but not thought to be a candidate for cardiac cath.  Continue xarelto for AF and h/o PE. Twelve month prognosis is poor. Consider Palliative Care Consult for Goals of Care discussion.   CLEGG,AMY, NP-C  7:39 AM Advanced Heart Failure Team Pager [phone] (M-F; 7a - 4p)  Please contact [facility] for night-coverage after hours (4p -7a ) and weekends on [email or web address]  Patient seen and examined with Tonye Becket, NP. We discussed all aspects of the encounter. I agree with the assessment and plan as stated above.   Still with volume on board. Agree with continuing IV lasix. Renal function stable.  Daniel Bensimhon,MD 9:55 AM

## 2014-09-25 NOTE — Progress Notes (Signed)
Pt remains sinus tach on monitor but asymptomatic; she denies any pain or discomfort; pt in bed comfortably with daughter at bedside and call light within reach; new order received for Amiodarone drip; pt daughter notified of new order. reported off to incoming RN. Dionne Bucy RN

## 2014-09-25 NOTE — Progress Notes (Signed)
Pt. coughed up some old blood per family, is in basin in room, RN notified

## 2014-09-25 NOTE — Progress Notes (Signed)
Patient experiencing generalized pruritis on arms, abdomen, and legs approximately 40 minutes after amiodarone infusion started.  Lotion applied, which patient stated helped reduce the itching some.  No rash or discoloration noted.  MD on call notified, hydroxyzine ordered PRN.  When RN at bedside to administer hydroxyzine, patient asleep and resting comfortably.  Will continue to monitor.

## 2014-09-26 ENCOUNTER — Inpatient Hospital Stay (HOSPITAL_COMMUNITY): Payer: Medicare Other

## 2014-09-26 DIAGNOSIS — J9621 Acute and chronic respiratory failure with hypoxia: Secondary | ICD-10-CM

## 2014-09-26 LAB — BLOOD GAS, ARTERIAL
ACID-BASE EXCESS: 8 mmol/L — AB (ref 0.0–2.0)
Bicarbonate: 32.8 mEq/L — ABNORMAL HIGH (ref 20.0–24.0)
DRAWN BY: 246861
FIO2: 1 %
O2 Saturation: 98 %
PCO2 ART: 53.9 mmHg — AB (ref 35.0–45.0)
PH ART: 7.402 (ref 7.350–7.450)
Patient temperature: 98.6
TCO2: 34.5 mmol/L (ref 0–100)
pO2, Arterial: 108 mmHg — ABNORMAL HIGH (ref 80.0–100.0)

## 2014-09-26 LAB — CBC
HEMATOCRIT: 32.8 % — AB (ref 36.0–46.0)
Hemoglobin: 9.9 g/dL — ABNORMAL LOW (ref 12.0–15.0)
MCH: 27.5 pg (ref 26.0–34.0)
MCHC: 30.2 g/dL (ref 30.0–36.0)
MCV: 91.1 fL (ref 78.0–100.0)
Platelets: 203 10*3/uL (ref 150–400)
RBC: 3.6 MIL/uL — ABNORMAL LOW (ref 3.87–5.11)
RDW: 16.3 % — AB (ref 11.5–15.5)
WBC: 2.6 10*3/uL — AB (ref 4.0–10.5)

## 2014-09-26 LAB — GLUCOSE, CAPILLARY
GLUCOSE-CAPILLARY: 107 mg/dL — AB (ref 70–99)
Glucose-Capillary: 165 mg/dL — ABNORMAL HIGH (ref 70–99)
Glucose-Capillary: 219 mg/dL — ABNORMAL HIGH (ref 70–99)
Glucose-Capillary: 162 mg/dL — ABNORMAL HIGH (ref 70–99)
Glucose-Capillary: 202 mg/dL — ABNORMAL HIGH (ref 70–99)

## 2014-09-26 LAB — BASIC METABOLIC PANEL
Anion gap: 6 (ref 5–15)
BUN: 17 mg/dL (ref 6–23)
CO2: 33 mmol/L — ABNORMAL HIGH (ref 19–32)
CREATININE: 0.83 mg/dL (ref 0.50–1.10)
Calcium: 8.1 mg/dL — ABNORMAL LOW (ref 8.4–10.5)
Chloride: 99 mmol/L (ref 96–112)
GFR calc Af Amer: 72 mL/min — ABNORMAL LOW (ref >90)
GFR calc non Af Amer: 62 mL/min — ABNORMAL LOW (ref >90)
GLUCOSE: 169 mg/dL — AB (ref 70–99)
Potassium: 4.1 mmol/L (ref 3.5–5.1)
Sodium: 138 mmol/L (ref 135–145)

## 2014-09-26 LAB — MRSA PCR SCREENING: MRSA BY PCR: NEGATIVE

## 2014-09-26 MED ORDER — ALBUTEROL SULFATE (2.5 MG/3ML) 0.083% IN NEBU
5.0000 mg | INHALATION_SOLUTION | RESPIRATORY_TRACT | Status: DC | PRN
  Administered 2014-09-26: 5 mg via RESPIRATORY_TRACT
  Filled 2014-09-26: qty 6

## 2014-09-26 NOTE — Progress Notes (Signed)
Noted on telemetry monitor that patient's HR 59.  Went into patient's room to take vital signs and adjust amiodarone rate per order.  Patient's oxygen saturation 82% on 4L via nasal cannula.  Patient asleep but easily arousable, orientated at baseline.  Lung sounds clear and diminished to auscultation, unchanged from shift assessment.  Repositioned patient with 2 assist to sitting position in bed and turned oxygen to 6L via nasal cannula.  Patient's oxygen saturation rose to 85%.  Called RT and Dr. Tasia Catchings to make aware of patient's symptoms.  Placed patient on venturi mask.  RT at bedside for assessment, PRN breathing treatment administered.  Per Dr. Tasia Catchings, notify if patient's HR drops below 50, otherwise maintain amiodarone infusion at current rate.  No additional orders at this time.  Will continue to monitor.

## 2014-09-26 NOTE — Progress Notes (Signed)
Continuous pulse ox applied to patient.  Oxygen saturation 94% on venturi mask.  Will continue to monitor.

## 2014-09-26 NOTE — Progress Notes (Signed)
Dr. Tasia Catchings notified that patient's oxygen saturation 86-91% on venturi mask.  Per Dr. Tasia Catchings, give another breathing treatment and apply non-rebreather if oxygen saturation does not rise.  Oxygen saturation remains 87-92% after breathing treatment.  RT applied non-rebreather mask.  Dr. Tasia Catchings notified of patient's status, transfer orders place.  Awaiting requested bed.  Will update patient and continue to monitor.

## 2014-09-26 NOTE — Progress Notes (Signed)
Nursing report called in to [facility] RN, on unit 2C.  Pt to transfer to stepdown to room 2C09.

## 2014-09-26 NOTE — Progress Notes (Signed)
Subjective: [name] was noted to be in  afib with RVR in the 120s yesterday afternoon. She was started on an amiodarone drip which caused some associated itching. She was later noted to be with oxygen sat down to 82% on 4L [place]. She was ultimately placed on nonrebreather to maintain appropriate oxygenation and transfer orders placed to step-down. She also had a cough yesterday with tan colored sputum. She was seen this morning by heart failure team who stopped the amiodarone drip due to improved HR.    Wendy Hargan was sitting in bed just completed eating full tray of breakfast with nonrebreather in place. She said that she continues to feel that her breathing is improved. She denied any cough. She thinks her legs are also improving. She said her family should be here after work.  Objective: Vital signs in last 24 hours: Filed Vitals:   09/26/14 0515 09/26/14 0536 09/26/14 0810 09/26/14 1103  BP:    124/75  Pulse:      Temp:      TempSrc:      Resp:      Height:      Weight: 177 lb 6.4 oz (80.468 kg)     SpO2:  97% 91%    Weight change: -11.7 oz (-0.332 kg)  Intake/Output Summary (Last 24 hours) at 09/26/14 1220 Last data filed at 09/26/14 1027  Gross per 24 hour  Intake   1243 ml  Output   1726 ml  Net   -483 ml   Gen: A&O x 4, no acute distress, sitting upright in bed on nonrebreather HEENT: [place]/AT, EOMI, moist mucous membranes Heart: Irregularly irregular, no murmurs, rubs, or gallops Lungs: CTAB, respirations unlabored while on nonrebreather sating in the high 90s Abd: Soft, non-tender, non-distended, + bowel sounds Ext: 1-2+ b/l pitting edema up to knees, L>R, evidence of chronic venous stasis b/l, skin is very dry, ted hoes in place  Lab Results: Basic Metabolic Panel:  Recent Labs Lab 09/25/14 1145 09/26/14 1037  NA 139 138  K 3.6 4.1  CL 98 99  CO2 35* 33*  GLUCOSE 189* 169*  BUN 15 17  CREATININE 0.74 0.83  CALCIUM 7.9* 8.1*    CBC:  Recent Labs Lab  09/20/14 1654  09/25/14 1145 09/26/14 1037  WBC 1.8*  < > 3.3* 2.6*  NEUTROABS 0.9*  --   --   --   HGB 10.9*  < > 10.1* 9.9*  HCT 35.8*  < > 33.4* 32.8*  MCV 89.7  < > 90.8 91.1  PLT 230  < > 225 203  < > = values in this interval not displayed. CBG:  Recent Labs Lab 09/23/14 2111 09/24/14 0635 09/24/14 1103 09/24/14 1638 09/24/14 2008 09/25/14 0624  GLUCAP 171* 119* 164* 207* 152* 111*   BNP 550  ABG pH 7.40, pCO2 54, pO2 108, bicarb 33, on nonrebreather  TTE:  - Left ventricle: The cavity size was normal. There was mild concentric hypertrophy. Systolic function was severely reduced. The estimated ejection fraction was in the range of 20% to 25%. Severe diffuse hypokinesis with no identifiable regional variations. - Ventricular septum: Septal motion showed abnormal function, dyssynergy, and paradox. The contour showed diastolic flattening. These changes are consistent with RV volume overload. - Aortic valve: There was mild regurgitation directed centrally in the LVOT. - Mitral valve: There was mild to moderate regurgitation directed centrally. The acceleration rate of the regurgitant jet was reduced, consistent with a low dP/dt. - Left  atrium: The atrium was moderately to severely dilated. - Right ventricle: The cavity size was moderately dilated. Systolic function was moderately to severely reduced. - Right atrium: The atrium was severely dilated. - Atrial septum: A patent foramen ovale cannot be excluded. - Tricuspid valve: There was malcoaptation of the valve leaflets. There was severe regurgitation directed centrally. - Pulmonary arteries: Systolic pressure was moderately increased. PA peak pressure: 54 mm Hg (S). - Pericardium, extracardiac: A small, free-flowing pericardial effusion was identified along the right atrial free wall. There was a right pleural effusion. There was a left pleural effusion.  CXR 09/26/14   FINDINGS: Portable AP semi upright view at 0715 hours. Stable cardiac and mediastinal contours, with severe cardiomegaly. Visualized tracheal air column is within normal limits. No pneumothorax. Chronic retrocardiac hypoventilation. Interval Stable pulmonary vascularity, no overt edema. Probable small pleural effusions, stable. No areas of worsening ventilation.  IMPRESSION: No overt edema or areas of worsening ventilation. Stable chest with severe cardiomegaly, chronic retrocardiac hypoventilation, and probable small pleural effusions.  CXR 09/25/14:  FINDINGS: Shallow inspiration. Diffusely enlarged cardiac silhouette with mild vascular congestion. Findings may be due to either a combination of cardiac enlargement and pericardial effusion. Probable small bilateral pleural effusions. Bilateral basilar atelectasis. No pneumothorax. Calcified and tortuous aorta. Appearances are similar prior study.  IMPRESSION: Cardiac enlargement with mild pulmonary vascular congestion. Probable small pleural effusions with basilar atelectasis bilaterally.  Medications: I have reviewed the patient's current medications. Scheduled Meds: . brimonidine  1 drop Left Eye BID  . dorzolamide  1 drop Left Eye BID  . furosemide  80 mg Intravenous BID  . insulin aspart  0-9 Units Subcutaneous TID WC  . lisinopril  5 mg Oral Daily  . metoprolol succinate  100 mg Oral Daily  . mometasone-formoterol  2 puff Inhalation BID  . potassium chloride  40 mEq Oral BID  . rivaroxaban  20 mg Oral Q supper  . sodium chloride  3 mL Intravenous Q12H  . sodium chloride  3 mL Intravenous Q12H  . spironolactone  25 mg Oral Daily   Continuous Infusions:  PRN Meds:.albuterol, diclofenac sodium, HYDROcodone-acetaminophen, hydrOXYzine   Assessment/Plan:  Hypoxia: See HPI above. Cause of acute respiratory decompensation unclear. Her CHF appears to be improving by weight and exam. Flash pulmonary edema in setting of a  fib with RVR unlikely given CXR findings. She was on xarelto in terms of anticoagulation and risk of PE although she may have failed this anticoagulation and was on only 15 mg prior to admission but has been on 20 mg here. ABG with good oxygenation and stable from a year ago without acidosis or alkalosis. -nonrebreather -transfer to stepdown -cont to monitor -try to have family meeting  Atrial fibrillation on RVR: See above. Her RVR resolved on amiodarone drip which was discontinued this morning. She continues to be in atrial fibrillation as she has prior. Her HR since amio drip was discontinued has been well controlled -metoprolol 100 mg daily -xarelto 20 mg daily -cardiac monitoring  AoC CHF: weight is down 11 pounds since admission.  Still edematous but decreased from 3+ with daily improvement.  I&Os are inaccurate.  Want to avoid Foley due to risk of infection and other objective measures seem to show slow but steady improvement in volume status.  Right leg swelling has improved more than left (left appears larger) and may be due to hx of DVT in left leg.  DVT would be unlikely since she is on Xarelto, pain is  B/L and edema seems more likely due to current exacerbation of HF.  -transfer to stepdown -appreciate cardiology recs -continue diuresis with lasix 80 mg iv bid -cont toprol-xl 100 mg daily, lisinopril 5 mg daily, spironolactone 25 mg daily -KDur 40 mEq po bid -PT/OT -strict I&O -daily weights -norco 5-325 mg po q8hprn for severe leg pain; will add voltaren gel as well  -consider lower extremity doppler if worsening  Anemia: stable since admission but FOB from admission is positive. She is on xarelto as noted above but no signs of bleeding.  Anemia panel c/w Fe deficiency. Will hold on GI consult given needed goals of care for patient and whether she is a candidate for further work-up. -cont to monitor  Asthma: No longer wheezing on exam  - albuterol 2.5 mg neb q2h prn -  continue Dulera 2 puff inh bid and oxygen supplementation  Pulmonary HTN: Wendy Tellefsen's PA 54 mm Hg is improved from 61 mmHg in 03/2014.There were pulmonary vascular changes noted on CXR. Cardiology feels she is not a candidate for cardiac catheterization. Goals of care discussion needed.  -appreciate cards  DM2: Last A1c 7.6 on 1/129/16. She takes metformin 1000 mg bid at home.  AM CBG 169 -hold metformin 1000 mg bid -continue SSI-sensitive  HTN: stable. At home she is on lisinopril 5 mg daily, toprol-xl 100 mg daily, and lasix 120 mg am, 80 mg pm. -cont lisinopril 5 mg daily, toprol-xl 100 mg daily, spironolactone 25 mg daily -lasix 80 mg iv bid as noted above  Leukopenia: WBC 2.6 today. WBC has ranged from 1.8 on presentation to 2.5. This appears to be stable near her baseline for the past year. Differential distribution on admission 51% neutrophil, 29% lymphocyte, 16% monocyte, 3% eosinophil, 1% basophil.  Dispo: Disposition is deferred at this time, awaiting improvement of current medical problems.  Anticipated discharge in approximately 1-2 day(s).   The patient does have a current PCP Yolanda Manges, DO) and does need an [facility] hospital follow-up appointment after discharge.  The patient does not know have transportation limitations that hinder transportation to clinic appointments.  .Services Needed at time of discharge: Y = Yes, Blank = No PT:   OT:   RN:   Equipment:   Other:     LOS: 6 days   Lorenda Hatchet, MD 09/26/2014, 12:20 PM

## 2014-09-26 NOTE — Progress Notes (Signed)
Dr Redmond Pulling and I met with Ms Omara and daughter [name] at approximately 6:30 pm to discuss her hospitalization. We explained that she had volume overload due to her heart having decreased ability to pump blood and that the cardiologists have been involved to make sure we are providing optimal medical management but that they agree the patient has a poor prognosis. We explained that her heart failure is permanent and likely to deteriorate and we should focus on Ms Mccumbers quality of life. The daughter was very agreeable and appreciative of our care as she noticed her mother's fluid overload is greatly improved. They both expressed understanding of the patient's prognosis. We then explained code status and the reality of a full code intervention, including CPR, intubation, and transfer to the ICU. Both [name] and Ms Yasin agreed that they do not want this and both verbally gave permission to change her code status to DNR. We offered to meet with the rest of the family to discuss at a convenient time and [name] said she would relay this information to her siblings and others. They had no further questions. During our interview, Ms Ammon was on venturi mask and breathing comfortably sitting up in bed.  Lottie Mussel, MD 09/26/14 6:55 pm

## 2014-09-26 NOTE — Progress Notes (Signed)
Patient's son, [name], made aware that patient moved to 2C09.

## 2014-09-26 NOTE — Progress Notes (Signed)
Advanced Heart Failure Rounding Note   Subjective:     Started on IV amio last night for increasing AF rate (she has chronic AF). Developed diffuse itching soon after amio - no hives. This morning has had increasing respiratory distress requiring NRB.  Pending transfer. CXR without overt CHF. She is somnolent but arousable. Says she feels ok. Tan sputum yesterday but no cough today/   Objective:   Weight Range:  Vital Signs:   Temp:  [98.4 F (36.9 C)-98.8 F (37.1 C)] 98.4 F (36.9 C) (02/09 0509) Pulse Rate:  [76-112] 84 (02/09 0509) Resp:  [20-22] 22 (02/09 0509) BP: (92-126)/(52-82) 119/65 mmHg (02/09 0509) SpO2:  [82 %-98 %] 97 % (02/09 0536) FiO2 (%):  [55 %] 55 % (02/09 0514) Weight:  [80.468 kg (177 lb 6.4 oz)] 80.468 kg (177 lb 6.4 oz) (02/09 0515) Last BM Date: 09/25/14  Weight change: Filed Weights   09/24/14 0558 09/25/14 0528 09/26/14 0515  Weight: 81.4 kg (179 lb 7.3 oz) 80.8 kg (178 lb 2.1 oz) 80.468 kg (177 lb 6.4 oz)    Intake/Output:   Intake/Output Summary (Last 24 hours) at 09/26/14 0645 Last data filed at 09/26/14 0507  Gross per 24 hour  Intake   1603 ml  Output   2226 ml  Net   -623 ml     Physical Exam:  General: Elderly on NRB HEENT: normal  Neck: supple. JVP up. Carotids 2+ bilat; no bruits. No lymphadenopathy or thryomegaly appreciated.  Cor: PMI nondisplaced. Irregular rate & rhythm. 2/6 SEM at apex  Lungs: dimished but clear Abdomen: soft, nontender, nondistended. No hepatosplenomegaly. No bruits or masses. Good bowel sounds.  Extremities: no cyanosis, clubbing, rash, R and LLE tr edema  Neuro: alert & orientedx3, cranial nerves grossly intact. moves all 4 extremities w/o difficulty. Affect pleasant   Telemetry: AF 130s last night now 60-70  Labs: Basic Metabolic Panel:  Recent Labs Lab 09/21/14 0455 09/22/14 0417 09/23/14 0337 09/24/14 0545 09/25/14 1145  NA 138 138 140 141 139  K 3.6 3.5 3.5 3.2* 3.6  CL 101 100 101 99 98   CO2 30 33* 31 36* 35*  GLUCOSE 111* 144* 103* 121* 189*  BUN 13 15 16 15 15   CREATININE 0.90 0.84 0.71 0.70 0.74  CALCIUM 7.8* 7.9* 7.6* 7.8* 7.9*    Liver Function Tests:  Recent Labs Lab 09/20/14 1654  AST 20  ALT 7  ALKPHOS 110  BILITOT 0.9  PROT 6.8  ALBUMIN 3.7   No results for input(s): LIPASE, AMYLASE in the last 168 hours. No results for input(s): AMMONIA in the last 168 hours.  CBC:  Recent Labs Lab 09/20/14 1654  09/21/14 1330 09/22/14 0417 09/23/14 0337 09/24/14 0545 09/25/14 1145  WBC 1.8*  < > 2.5* 2.1* 2.4* 2.1* 3.3*  NEUTROABS 0.9*  --   --   --   --   --   --   HGB 10.9*  < > 10.5* 9.8* 9.9* 9.8* 10.1*  HCT 35.8*  < > 33.3* 32.1* 32.7* 32.1* 33.4*  MCV 89.7  < > 88.6 88.9 89.8 88.9 90.8  PLT 230  < > 243 226 223 218 225  < > = values in this interval not displayed.  Cardiac Enzymes: No results for input(s): CKTOTAL, CKMB, CKMBINDEX, TROPONINI in the last 168 hours.  BNP: BNP (last 3 results)  Recent Labs  09/05/14 0955 09/20/14 1654  BNP 779.3* 549.9*    ProBNP (last 3 results)  Recent Labs  12/14/13 1846 02/17/14 1350 04/05/14 0855  PROBNP 4874.0* 3166.0* 4201.0*      Other results:    Imaging: Dg Chest Port 1 View  09/25/2014   CLINICAL DATA:  Acute exacerbation of heart failure. Current admission for diuresis.  EXAM: PORTABLE CHEST - 1 VIEW  COMPARISON:  09/20/2014  FINDINGS: Shallow inspiration. Diffusely enlarged cardiac silhouette with mild vascular congestion. Findings may be due to either a combination of cardiac enlargement and pericardial effusion. Probable small bilateral pleural effusions. Bilateral basilar atelectasis. No pneumothorax. Calcified and tortuous aorta. Appearances are similar prior study.  IMPRESSION: Cardiac enlargement with mild pulmonary vascular congestion. Probable small pleural effusions with basilar atelectasis bilaterally.   Electronically Signed   By: Burman Nieves M.D.   On: 09/25/2014 17:24      Medications:     Scheduled Medications: . brimonidine  1 drop Left Eye BID  . dorzolamide  1 drop Left Eye BID  . furosemide  80 mg Intravenous BID  . insulin aspart  0-9 Units Subcutaneous TID WC  . lisinopril  5 mg Oral Daily  . metoprolol succinate  100 mg Oral Daily  . mometasone-formoterol  2 puff Inhalation BID  . potassium chloride  40 mEq Oral BID  . rivaroxaban  20 mg Oral Q supper  . sodium chloride  3 mL Intravenous Q12H  . sodium chloride  3 mL Intravenous Q12H  . spironolactone  25 mg Oral Daily    Infusions: . amiodarone 30 mg/hr (09/26/14 0120)    PRN Medications: albuterol, diclofenac sodium, HYDROcodone-acetaminophen, hydrOXYzine   Assessment:   1. Hypoxia likely due to volume overload.  2. A/C Systolic /Diastolic HF  --EF 20-25% with moderate to severe RV dysfunction  3. Chronic A fib Chads Vasc Score 6  4. H/O PE  5. Immobility  6. Leuopenia  7. Acute on chronic respiratory Failure - On nocturnal oxygen.  8. Hypokalemia   Plan/Discussion:    Respiratory status much worse today. I am not sure what caused an increase in the rate of her chronic AF but suspect underlying respiratory process driving it. HR is much improved on amio but pruritus concerning for allergy. Will stop.   Volume status continues to improve so I am not sure why her respiratory status is worse. PNA and PE are considerations but she has been on Xarelto.   Will check ABG and repeat pCXR. Continue IV lasix as BP tolerates. Primary team working up.   Prognosis increasingly poor. Consider Palliative Care Consult for Goals of Care discussion.   Arvilla Meres, MD  6:45 AM Advanced Heart Failure Team Pager [phone] (M-F; 7a - 4p)  Please contact [facility] for night-coverage after hours (4p -7a ) and weekends on [email or web address]

## 2014-09-27 LAB — BASIC METABOLIC PANEL
ANION GAP: 4 — AB (ref 5–15)
BUN: 18 mg/dL (ref 6–23)
CHLORIDE: 101 mmol/L (ref 96–112)
CO2: 33 mmol/L — ABNORMAL HIGH (ref 19–32)
CREATININE: 0.75 mg/dL (ref 0.50–1.10)
Calcium: 8 mg/dL — ABNORMAL LOW (ref 8.4–10.5)
GFR calc Af Amer: 86 mL/min — ABNORMAL LOW (ref >90)
GFR calc non Af Amer: 75 mL/min — ABNORMAL LOW (ref >90)
Glucose, Bld: 175 mg/dL — ABNORMAL HIGH (ref 70–99)
POTASSIUM: 4.4 mmol/L (ref 3.5–5.1)
Sodium: 138 mmol/L (ref 135–145)

## 2014-09-27 LAB — GLUCOSE, CAPILLARY
GLUCOSE-CAPILLARY: 159 mg/dL — AB (ref 70–99)
GLUCOSE-CAPILLARY: 133 mg/dL — AB (ref 70–99)
GLUCOSE-CAPILLARY: 183 mg/dL — AB (ref 70–99)
Glucose-Capillary: 177 mg/dL — ABNORMAL HIGH (ref 70–99)
Glucose-Capillary: 189 mg/dL — ABNORMAL HIGH (ref 70–99)
Glucose-Capillary: 88 mg/dL (ref 70–99)

## 2014-09-27 LAB — CBC
HEMATOCRIT: 31.7 % — AB (ref 36.0–46.0)
HEMOGLOBIN: 9.6 g/dL — AB (ref 12.0–15.0)
MCH: 27.6 pg (ref 26.0–34.0)
MCHC: 30.3 g/dL (ref 30.0–36.0)
MCV: 91.1 fL (ref 78.0–100.0)
PLATELETS: 203 10*3/uL (ref 150–400)
RBC: 3.48 MIL/uL — ABNORMAL LOW (ref 3.87–5.11)
RDW: 16.5 % — ABNORMAL HIGH (ref 11.5–15.5)
WBC: 2.8 10*3/uL — AB (ref 4.0–10.5)

## 2014-09-27 MED ORDER — CETYLPYRIDINIUM CHLORIDE 0.05 % MT LIQD
7.0000 mL | Freq: Two times a day (BID) | OROMUCOSAL | Status: DC
  Administered 2014-09-27 – 2014-10-02 (×10): 7 mL via OROMUCOSAL

## 2014-09-27 MED ORDER — ALBUTEROL SULFATE (2.5 MG/3ML) 0.083% IN NEBU: 5.0000 mg | INHALATION_SOLUTION | Freq: Four times a day (QID) | RESPIRATORY_TRACT | Status: DC | PRN

## 2014-09-27 NOTE — Progress Notes (Signed)
Subjective: See yesterday's note regarding family meeting with daughter [name] and Ms Wollenberg. This was a goals of care meeting where they both decided to change code status to DNR. This morning she had been taking off the venturi mask and was sating high 90s on 3 L nasal cannula with HR in the 70s-80s. She says that her breathing continues to improve and she thinks that her legs are also improved. She says that her leg pain is well controlled with norco. She had no complaints.   Objective: Vital signs in last 24 hours: Filed Vitals:   09/27/14 0517 09/27/14 0758 09/27/14 0854 09/27/14 1028  BP: 125/69 133/59  133/59  Pulse: 75 70  83  Temp: 98 F (36.7 C) 97.4 F (36.3 C)    TempSrc: Oral Oral    Resp: 21 16    Height:      Weight:      SpO2: 100% 99% 96%    Weight change:   Intake/Output Summary (Last 24 hours) at 09/27/14 1049 Last data filed at 09/27/14 1040  Gross per 24 hour  Intake    360 ml  Output   3525 ml  Net  -3165 ml   Gen: A&O x 4, no acute distress, sitting upright in bed on 3L nasal cannula HEENT: [place]/AT, EOMI, moist mucous membranes Heart: Irregularly irregular, no murmurs, rubs, or gallops Lungs: B/l expiratory wheezes, respirations unlabored while on 3L nasal cannula sating in the high 90s Abd: Soft, non-tender, non-distended, + bowel sounds Ext: 1+ b/l pitting edema up to knees, evidence of chronic venous stasis b/l, skin is very dry, ted hoes in place  Lab Results: Basic Metabolic Panel:  Recent Labs Lab 09/26/14 1037 09/27/14 0337  NA 138 138  K 4.1 4.4  CL 99 101  CO2 33* 33*  GLUCOSE 169* 175*  BUN 17 18  CREATININE 0.83 0.75  CALCIUM 8.1* 8.0*    CBC:  Recent Labs Lab 09/20/14 1654  09/26/14 1037 09/27/14 0337  WBC 1.8*  < > 2.6* 2.8*  NEUTROABS 0.9*  --   --   --   HGB 10.9*  < > 9.9* 9.6*  HCT 35.8*  < > 32.8* 31.7*  MCV 89.7  < > 91.1 91.1  PLT 230  < > 203 203  < > = values in this interval not  displayed. CBG:  Recent Labs Lab 09/25/14 1157 09/25/14 1637 09/26/14 1059 09/26/14 1627 09/26/14 2126 09/27/14 0755  GLUCAP 165* 219* 162* 107* 202* 133*   BNP 550  ABG pH 7.40, pCO2 54, pO2 108, bicarb 33, on nonrebreather  TTE:  - Left ventricle: The cavity size was normal. There was mild concentric hypertrophy. Systolic function was severely reduced. The estimated ejection fraction was in the range of 20% to 25%. Severe diffuse hypokinesis with no identifiable regional variations. - Ventricular septum: Septal motion showed abnormal function, dyssynergy, and paradox. The contour showed diastolic flattening. These changes are consistent with RV volume overload. - Aortic valve: There was mild regurgitation directed centrally in the LVOT. - Mitral valve: There was mild to moderate regurgitation directed centrally. The acceleration rate of the regurgitant jet was reduced, consistent with a low dP/dt. - Left atrium: The atrium was moderately to severely dilated. - Right ventricle: The cavity size was moderately dilated. Systolic function was moderately to severely reduced. - Right atrium: The atrium was severely dilated. - Atrial septum: A patent foramen ovale cannot be excluded. - Tricuspid valve: There was malcoaptation of  the valve leaflets. There was severe regurgitation directed centrally. - Pulmonary arteries: Systolic pressure was moderately increased. PA peak pressure: 54 mm Hg (S). - Pericardium, extracardiac: A small, free-flowing pericardial effusion was identified along the right atrial free wall. There was a right pleural effusion. There was a left pleural effusion.  CXR 09/26/14  FINDINGS: Portable AP semi upright view at 0715 hours. Stable cardiac and mediastinal contours, with severe cardiomegaly. Visualized tracheal air column is within normal limits. No pneumothorax. Chronic retrocardiac hypoventilation. Interval Stable  pulmonary vascularity, no overt edema. Probable small pleural effusions, stable. No areas of worsening ventilation.  IMPRESSION: No overt edema or areas of worsening ventilation. Stable chest with severe cardiomegaly, chronic retrocardiac hypoventilation, and probable small pleural effusions.  CXR 09/25/14:  FINDINGS: Shallow inspiration. Diffusely enlarged cardiac silhouette with mild vascular congestion. Findings may be due to either a combination of cardiac enlargement and pericardial effusion. Probable small bilateral pleural effusions. Bilateral basilar atelectasis. No pneumothorax. Calcified and tortuous aorta. Appearances are similar prior study.  IMPRESSION: Cardiac enlargement with mild pulmonary vascular congestion. Probable small pleural effusions with basilar atelectasis bilaterally.  Medications: I have reviewed the patient's current medications. Scheduled Meds: . brimonidine  1 drop Left Eye BID  . dorzolamide  1 drop Left Eye BID  . furosemide  80 mg Intravenous BID  . insulin aspart  0-9 Units Subcutaneous TID WC  . lisinopril  5 mg Oral Daily  . metoprolol succinate  100 mg Oral Daily  . mometasone-formoterol  2 puff Inhalation BID  . potassium chloride  40 mEq Oral BID  . rivaroxaban  20 mg Oral Q supper  . sodium chloride  3 mL Intravenous Q12H  . sodium chloride  3 mL Intravenous Q12H  . spironolactone  25 mg Oral Daily   Continuous Infusions:  PRN Meds:.albuterol, diclofenac sodium, HYDROcodone-acetaminophen, hydrOXYzine   Assessment/Plan:  Hypoxia: Unclear etiology but appears to be improving as she is now sating well on 3L nasal cannula. This episode about a day ago does not seem to be CHF related as she is clearly improving in terms of volume overload. Flash pulmonary edema in setting of a fib with RVR unlikely given CXR findings. She was on xarelto in terms of anticoagulation and risk of PE although she may have failed this anticoagulation and was  on only 15 mg prior to admission but has been on 20 mg here. ABG with good oxygenation and stable from a year ago without acidosis or alkalosis. Unclear if amiodarone may have contributed as it started shortly after she was started on amio drip and is now resolved.This appears resolved. -O2 > 92% -step-down today and consider transfer tomorrow if she continues to do well -cont to monitor  Atrial fibrillation: Her RVR resolved on amiodarone drip which was discontinued 09/26/14. She continues to be in atrial fibrillation as she has prior but she is now appropriately rate controlled.  -metoprolol 100 mg daily -xarelto 20 mg daily -cardiac monitoring  AoC CHF: weight is down 11 pounds since admission but no weight recorded today.  Still edematous but greatly decreased from 3+ with daily improvement. Her daughter agreed that her volume overload is greatly improved.  I&Os are inaccurate.  Want to avoid Foley due to risk of infection and other objective measures seem to show slow but steady improvement in volume status.  -consider transfer from step-down tomorrow -appreciate cardiology recs -continue diuresis with lasix 80 mg iv bid -cont toprol-xl 100 mg daily, lisinopril 5 mg daily,  spironolactone 25 mg daily -KDur 40 mEq po bid -PT/OT -strict I&O -daily weights -norco 5-325 mg po q8hprn for severe leg pain; will add voltaren gel as well   Asthma: She is now wheezing on exam. Scheduled albuterol had been d/ced while in RVR but will now resume q6h. - albuterol 2.5 mg neb q6h scheduled - continue Dulera 2 puff inh bid and oxygen supplementation  Anemia: stable since admission but FOB from admission is positive. She is on xarelto as noted above but no signs of bleeding.  Anemia panel c/w Fe deficiency. Will hold on GI consult given needed goals of care for patient and whether she is a candidate for further work-up. -cont to monitor  Pulmonary HTN: Ms Sevin's PA 54 mm Hg is improved from 61 mmHg in  03/2014.There were pulmonary vascular changes noted on CXR. Cardiology feels she is not a candidate for cardiac catheterization. Goals of care discussion needed.  -appreciate cards  DM2: Last A1c 7.6 on 1/129/16. She takes metformin 1000 mg bid at home.  AM CBG 133 -hold metformin 1000 mg bid -continue SSI-sensitive  HTN: stable. At home she is on lisinopril 5 mg daily, toprol-xl 100 mg daily, and lasix 120 mg am, 80 mg pm. -cont lisinopril 5 mg daily, toprol-xl 100 mg daily, spironolactone 25 mg daily -lasix 80 mg iv bid as noted above  Leukopenia: WBC 2.8 today. WBC has ranged from 1.8 on presentation to 2.5. This appears to be stable near her baseline for the past year. Differential distribution on admission 51% neutrophil, 29% lymphocyte, 16% monocyte, 3% eosinophil, 1% basophil.  Dispo: Disposition is deferred at this time, awaiting improvement of current medical problems.  Anticipated discharge in approximately 2 day(s).   The patient does have a current PCP Yolanda Manges, DO) and does need an [facility] hospital follow-up appointment after discharge.  The patient does not know have transportation limitations that hinder transportation to clinic appointments.  .Services Needed at time of discharge: Y = Yes, Blank = No PT:   OT:   RN:   Equipment:   Other:     LOS: 6 days   Lorenda Hatchet, MD 09/27/2014, 10:49 AM

## 2014-09-27 NOTE — Progress Notes (Signed)
Advanced Heart Failure Rounding Note   Subjective:     She is much better today. Denies dyspnea. She was on Venturi mask when i came into the room. I switched her to 3L nasal cannula and she maintained sats at 97-98%. Denies dyspnea. Continues to diurese. AF rate 70-80s    Objective:   Weight Range:  Vital Signs:   Temp:  [97.4 F (36.3 C)-98.5 F (36.9 C)] 97.4 F (36.3 C) (02/10 0758) Pulse Rate:  [70-102] 70 (02/10 0758) Resp:  [16-21] 16 (02/10 0758) BP: (118-133)/(59-81) 133/59 mmHg (02/10 0758) SpO2:  [90 %-100 %] 99 % (02/10 0758) FiO2 (%):  [55 %] 55 % (02/09 2003) Last BM Date: 09/25/14  Weight change: Filed Weights   09/24/14 0558 09/25/14 0528 09/26/14 0515  Weight: 81.4 kg (179 lb 7.3 oz) 80.8 kg (178 lb 2.1 oz) 80.468 kg (177 lb 6.4 oz)    Intake/Output:   Intake/Output Summary (Last 24 hours) at 09/27/14 0831 Last data filed at 09/27/14 0806  Gross per 24 hour  Intake    240 ml  Output   3600 ml  Net  -3360 ml     Physical Exam:  General: Elderly on FM. NAD HEENT: normal  Neck: supple. JVP up. Carotids 2+ bilat; no bruits. No lymphadenopathy or thryomegaly appreciated.  Cor: PMI nondisplaced. Irregular rate & rhythm. 2/6 MR/TR at apex  Lungs: dimished but clear Abdomen: soft, nontender, nondistended. No hepatosplenomegaly. No bruits or masses. Good bowel sounds.  Extremities: no cyanosis, clubbing, rash, R and LLE 1+ edema  Neuro: alert & orientedx3, cranial nerves grossly intact. moves all 4 extremities w/o difficulty. Affect pleasant   Telemetry: AF 70-80  Labs: Basic Metabolic Panel:  Recent Labs Lab 09/23/14 0337 09/24/14 0545 09/25/14 1145 09/26/14 1037 09/27/14 0337  NA 140 141 139 138 138  K 3.5 3.2* 3.6 4.1 4.4  CL 101 99 98 99 101  CO2 31 36* 35* 33* 33*  GLUCOSE 103* 121* 189* 169* 175*  BUN 16 15 15 17 18   CREATININE 0.71 0.70 0.74 0.83 0.75  CALCIUM 7.6* 7.8* 7.9* 8.1* 8.0*    Liver Function Tests:  Recent Labs Lab  09/20/14 1654  AST 20  ALT 7  ALKPHOS 110  BILITOT 0.9  PROT 6.8  ALBUMIN 3.7   No results for input(s): LIPASE, AMYLASE in the last 168 hours. No results for input(s): AMMONIA in the last 168 hours.  CBC:  Recent Labs Lab 09/20/14 1654  09/23/14 0337 09/24/14 0545 09/25/14 1145 09/26/14 1037 09/27/14 0337  WBC 1.8*  < > 2.4* 2.1* 3.3* 2.6* 2.8*  NEUTROABS 0.9*  --   --   --   --   --   --   HGB 10.9*  < > 9.9* 9.8* 10.1* 9.9* 9.6*  HCT 35.8*  < > 32.7* 32.1* 33.4* 32.8* 31.7*  MCV 89.7  < > 89.8 88.9 90.8 91.1 91.1  PLT 230  < > 223 218 225 203 203  < > = values in this interval not displayed.  Cardiac Enzymes: No results for input(s): CKTOTAL, CKMB, CKMBINDEX, TROPONINI in the last 168 hours.  BNP: BNP (last 3 results)  Recent Labs  09/05/14 0955 09/20/14 1654  BNP 779.3* 549.9*    ProBNP (last 3 results)  Recent Labs  12/14/13 1846 02/17/14 1350 04/05/14 0855  PROBNP 4874.0* 3166.0* 4201.0*      Other results:    Imaging: Dg Chest Port 1 View  09/26/2014   CLINICAL DATA:  81 year old female with congestive heart failure, worsening shortness of breath despite improved fluid status. Initial encounter.  EXAM: PORTABLE CHEST - 1 VIEW  COMPARISON:  09/25/2014 and earlier.  FINDINGS: Portable AP semi upright view at 0715 hours. Stable cardiac and mediastinal contours, with severe cardiomegaly. Visualized tracheal air column is within normal limits. No pneumothorax. Chronic retrocardiac hypoventilation. Interval Stable pulmonary vascularity, no overt edema. Probable small pleural effusions, stable. No areas of worsening ventilation.  IMPRESSION: No overt edema or areas of worsening ventilation. Stable chest with severe cardiomegaly, chronic retrocardiac hypoventilation, and probable small pleural effusions.   Electronically Signed   By: Odessa Fleming M.D.   On: 09/26/2014 07:39   Dg Chest Port 1 View  09/25/2014   CLINICAL DATA:  Acute exacerbation of heart  failure. Current admission for diuresis.  EXAM: PORTABLE CHEST - 1 VIEW  COMPARISON:  09/20/2014  FINDINGS: Shallow inspiration. Diffusely enlarged cardiac silhouette with mild vascular congestion. Findings may be due to either a combination of cardiac enlargement and pericardial effusion. Probable small bilateral pleural effusions. Bilateral basilar atelectasis. No pneumothorax. Calcified and tortuous aorta. Appearances are similar prior study.  IMPRESSION: Cardiac enlargement with mild pulmonary vascular congestion. Probable small pleural effusions with basilar atelectasis bilaterally.   Electronically Signed   By: Burman Nieves M.D.   On: 09/25/2014 17:24     Medications:     Scheduled Medications: . brimonidine  1 drop Left Eye BID  . dorzolamide  1 drop Left Eye BID  . furosemide  80 mg Intravenous BID  . insulin aspart  0-9 Units Subcutaneous TID WC  . lisinopril  5 mg Oral Daily  . metoprolol succinate  100 mg Oral Daily  . mometasone-formoterol  2 puff Inhalation BID  . potassium chloride  40 mEq Oral BID  . rivaroxaban  20 mg Oral Q supper  . sodium chloride  3 mL Intravenous Q12H  . sodium chloride  3 mL Intravenous Q12H  . spironolactone  25 mg Oral Daily    Infusions:    PRN Medications: albuterol, diclofenac sodium, HYDROcodone-acetaminophen, hydrOXYzine   Assessment:   1. Hypoxia likely due to volume overload.  2. A/C Systolic /Diastolic HF  --EF 20-25% with moderate to severe RV dysfunction  3. Chronic A fib Chads Vasc Score 6  4. H/O PE  5. Immobility  6. Leuopenia  7. Acute on chronic respiratory Failure - On nocturnal oxygen.  8. Hypokalemia   Plan/Discussion:    Respiratory status much better today. I have reviewed all studies and I remain at a loss as to what triggered her heart rate to go up and oxygenation to get so much worse. I am not sure if amio contributed to her desaturations or was just added as things were getting worse. Nevertheless we  will list it as allergy.  I switched to nasal cannula and will follow. Still has fluid on board. Continue to diurese.   Arvilla Meres, MD  8:31 AM Advanced Heart Failure Team Pager [phone] (M-F; 7a - 4p)  Please contact [facility] for night-coverage after hours (4p -7a ) and weekends on [email or web address]

## 2014-09-27 NOTE — Progress Notes (Signed)
Physical Therapy Treatment Patient Details Name: [name] MRN: [id number] DOB: [birth year] Today's Date: 09/27/2014    History of Present Illness 81 yo female with onset of LLE pain and LE edema, discovered pleural effusion, CHF, cardiomegaly and hypoxia on admission.    PT Comments    Patient tolerated EOB and OOB to chair with minimal bed to chair ambulation. Patient significantly limited by pain in BLE (knees) and decreased activity tolerance (SpO2 decreased to 86% on 3 liters with activity and HR elevated to upper 120s).  Patient educated on ROM and LE there ex to perform while legs elevated in recliner. Will continue to see and progress as tolerated.   Follow Up Recommendations  SNF     Equipment Recommendations  None recommended by PT    Recommendations for Other Services       Precautions / Restrictions Precautions Precautions: Fall Precaution Comments: monitor O2 Restrictions Weight Bearing Restrictions: No    Mobility  Bed Mobility Overal bed mobility: Needs Assistance Bed Mobility: Rolling;Sidelying to Sit Rolling: Min assist Sidelying to sit: Mod assist       General bed mobility comments: VCs for positioning to perform roll to sidelying and sit, assist to elevate trunk to upright position with cues for breathing during process  Transfers Overall transfer level: Needs assistance Equipment used: Rolling walker (2 wheeled) Transfers: Sit to/from Stand Sit to Stand: Max assist;+2 physical assistance         General transfer comment: Patient required moderate assist to scoot to EOB, then required Maximial assist to elevate to standing, difficulty pushing upright and patient with continued posterior lean during transfer progress.  Second attempt performed with +2 RW and gait belt/chuck pad combination. Patient cued for fwd posture   Ambulation/Gait Ambulation/Gait assistance: Mod assist;+2 physical assistance Ambulation Distance (Feet): 6 Feet Assistive  device: Rolling walker (2 wheeled) Gait Pattern/deviations: Step-to pattern;Decreased stride length;Shuffle;Leaning posteriorly;Trunk flexed   Gait velocity interpretation: Below normal speed for age/gender General Gait Details: Max cues for advancement of LEs and encouragement to continue mobility.    Stairs            Wheelchair Mobility    Modified Rankin (Stroke Patients Only)       Balance Overall balance assessment: Needs assistance Sitting-balance support: Feet supported Sitting balance-Leahy Scale: Fair Sitting balance - Comments: Tolerated EOB activity for >5 minutes   Standing balance support: Bilateral upper extremity supported Standing balance-Leahy Scale: Poor                      Cognition Arousal/Alertness: Awake/alert Behavior During Therapy: WFL for tasks assessed/performed Overall Cognitive Status: Within Functional Limits for tasks assessed                      Exercises      General Comments General comments (skin integrity, edema, etc.): patient tolerated activity on 3 liters [place] with brief desaturation to 86% but rebounded quickly with rest break.       Pertinent Vitals/Pain Pain Assessment: Faces Faces Pain Scale: Hurts even more Pain Location: bilateral LEs (knees) Pain Descriptors / Indicators: Aching;Sore Pain Intervention(s): Limited activity within patient's tolerance;Monitored during session;Repositioned    Home Living                      Prior Function            PT Goals (current goals can now be found in the care plan section)  Acute Rehab PT Goals Patient Stated Goal: pt really wants to go home PT Goal Formulation: With patient Time For Goal Achievement: 10/05/14 Potential to Achieve Goals: Fair Progress towards PT goals: Progressing toward goals (very modest progression, unable to tolerate ambulation)    Frequency  Min 3X/week    PT Plan Current plan remains appropriate    Co-evaluation              End of Session Equipment Utilized During Treatment: Gait belt;Oxygen Activity Tolerance: Patient limited by pain;Patient limited by fatigue Patient left: in chair;with call bell/phone within reach;with chair alarm set;with family/visitor present     Time: 1202-1226 PT Time Calculation (min) (ACUTE ONLY): 24 min  Charges:  $Therapeutic Activity: 23-37 mins                    G CodesFabio Asa 10-20-2014, 1:24 PM Charlotte Crumb, PT DPT  [phone]

## 2014-09-28 DIAGNOSIS — Z7951 Long term (current) use of inhaled steroids: Secondary | ICD-10-CM

## 2014-09-28 LAB — BASIC METABOLIC PANEL
ANION GAP: 4 — AB (ref 5–15)
BUN: 15 mg/dL (ref 6–23)
CO2: 36 mmol/L — ABNORMAL HIGH (ref 19–32)
Calcium: 8.3 mg/dL — ABNORMAL LOW (ref 8.4–10.5)
Chloride: 99 mmol/L (ref 96–112)
Creatinine, Ser: 0.74 mg/dL (ref 0.50–1.10)
GFR calc Af Amer: 87 mL/min — ABNORMAL LOW (ref >90)
GFR, EST NON AFRICAN AMERICAN: 75 mL/min — AB (ref >90)
Glucose, Bld: 137 mg/dL — ABNORMAL HIGH (ref 70–99)
Potassium: 4.7 mmol/L (ref 3.5–5.1)
SODIUM: 139 mmol/L (ref 135–145)

## 2014-09-28 LAB — CBC
HEMATOCRIT: 34.3 % — AB (ref 36.0–46.0)
Hemoglobin: 10.3 g/dL — ABNORMAL LOW (ref 12.0–15.0)
MCH: 27.4 pg (ref 26.0–34.0)
MCHC: 30 g/dL (ref 30.0–36.0)
MCV: 91.2 fL (ref 78.0–100.0)
Platelets: 220 10*3/uL (ref 150–400)
RBC: 3.76 MIL/uL — ABNORMAL LOW (ref 3.87–5.11)
RDW: 16.4 % — ABNORMAL HIGH (ref 11.5–15.5)
WBC: 2.4 10*3/uL — ABNORMAL LOW (ref 4.0–10.5)

## 2014-09-28 LAB — GLUCOSE, CAPILLARY
GLUCOSE-CAPILLARY: 115 mg/dL — AB (ref 70–99)
GLUCOSE-CAPILLARY: 202 mg/dL — AB (ref 70–99)
GLUCOSE-CAPILLARY: 113 mg/dL — AB (ref 70–99)
Glucose-Capillary: 136 mg/dL — ABNORMAL HIGH (ref 70–99)
Glucose-Capillary: 179 mg/dL — ABNORMAL HIGH (ref 70–99)

## 2014-09-28 MED ORDER — METOLAZONE 2.5 MG PO TABS
2.5000 mg | ORAL_TABLET | Freq: Every day | ORAL | Status: DC
  Administered 2014-09-28 – 2014-09-29 (×2): 2.5 mg via ORAL
  Filled 2014-09-28 (×2): qty 1

## 2014-09-28 MED ORDER — ALBUTEROL SULFATE (2.5 MG/3ML) 0.083% IN NEBU: 5.0000 mg | INHALATION_SOLUTION | Freq: Three times a day (TID) | RESPIRATORY_TRACT | Status: DC

## 2014-09-28 MED ORDER — ALBUTEROL SULFATE (2.5 MG/3ML) 0.083% IN NEBU
5.0000 mg | INHALATION_SOLUTION | Freq: Four times a day (QID) | RESPIRATORY_TRACT | Status: DC
  Administered 2014-09-28: 5 mg via RESPIRATORY_TRACT
  Administered 2014-09-28: 2.5 mg via RESPIRATORY_TRACT
  Filled 2014-09-28 (×2): qty 6

## 2014-09-28 MED ORDER — ALBUTEROL SULFATE (2.5 MG/3ML) 0.083% IN NEBU
2.5000 mg | INHALATION_SOLUTION | Freq: Three times a day (TID) | RESPIRATORY_TRACT | Status: DC
  Administered 2014-09-29 – 2014-10-02 (×10): 2.5 mg via RESPIRATORY_TRACT
  Filled 2014-09-28 (×10): qty 3

## 2014-09-28 NOTE — Progress Notes (Signed)
Occupational Therapy Treatment Patient Details Name: [name] MRN: [id number] DOB: 23-Dec-1925 Today's Date: 09/28/2014    History of present illness 81 yo female with onset of LLE pain and LE edema, discovered pleural effusion, CHF, cardiomegaly and hypoxia on admission.   OT comments  Pt motivated for OOB activities to chair.  Continues to require +2 max assist for transfers and bed mobility. HR noted to increase to 146 with activity with 02 at 83% on 3L. Pt will require DME if plan is for home, continue to recommend SNF for rehab.  Follow Up Recommendations  SNF;Supervision/Assistance - 24 hour    Equipment Recommendations  Wheelchair (measurements OT);Wheelchair cushion (measurements OT) (Will need hoyer lift and hospital bed if not agreeable to SN)    Recommendations for Other Services      Precautions / Restrictions Precautions Precautions: Fall Precaution Comments: monitor O2 and HR Restrictions Weight Bearing Restrictions: No Other Position/Activity Restrictions: decreased tolerance of standing due to knee pain, unable to stand without B UE support       Mobility Bed Mobility Overal bed mobility: Needs Assistance Bed Mobility: Rolling;Sidelying to Sit Rolling: Min assist Sidelying to sit: +2 for physical assistance;Mod assist       General bed mobility comments: assist to raise trunk and guide LEs off EOB  Transfers Overall transfer level: Needs assistance Equipment used: 2 person hand held assist Transfers: Sit to/from Stand;Stand Pivot Transfers Sit to Stand: +2 physical assistance;Mod assist Stand pivot transfers: +2 physical assistance;Max assist       General transfer comment: Assist to weight shift and to advance LEs, elevated bed surface    Balance     Sitting balance-Leahy Scale: Fair Sitting balance - Comments: Tolerated EOB activity for >5 minutes     Standing balance-Leahy Scale: Poor                     ADL Overall ADL's :  Needs assistance/impaired Eating/Feeding: Set up;Sitting Eating/Feeding Details (indicate cue type and reason): pt with complaints of tremor and increased spillage Grooming: Set up;Sitting   Upper Body Bathing: Minimal assitance;Sitting Upper Body Bathing Details (indicate cue type and reason): assist for back     Upper Body Dressing : Moderate assistance;Sitting   Lower Body Dressing: Total assistance Lower Body Dressing Details (indicate cue type and reason): compression hose and socks Toilet Transfer: +2 for physical assistance;Maximal assistance   Toileting- Clothing Manipulation and Hygiene: Total assistance;Bed level         General ADL Comments: Pt with awareness of need to have BM, placed on bedpan.      Vision                     Perception     Praxis      Cognition   Behavior During Therapy: WFL for tasks assessed/performed Overall Cognitive Status: No family/caregiver present to determine baseline cognitive functioning Area of Impairment: Memory     Memory: Decreased short-term memory               Extremity/Trunk Assessment               Exercises     Shoulder Instructions       General Comments      Pertinent Vitals/ Pain       Pain Assessment: Faces Faces Pain Scale: Hurts even more Pain Location: knees Pain Descriptors / Indicators: Grimacing Pain Intervention(s): Monitored during session;Limited activity within patient's tolerance;Repositioned  Home Living                                          Prior Functioning/Environment              Frequency Min 2X/week     Progress Toward Goals  OT Goals(current goals can now be found in the care plan section)  Progress towards OT goals: Progressing toward goals  Acute Rehab OT Goals Patient Stated Goal: agreeable to 2-3 days of rehab  Plan Discharge plan remains appropriate    Co-evaluation    PT/OT/SLP Co-Evaluation/Treatment: Yes Reason  for Co-Treatment: For patient/therapist safety   OT goals addressed during session: ADL's and self-care      End of Session Equipment Utilized During Treatment: Gait belt;Oxygen   Activity Tolerance Patient limited by fatigue;Patient limited by pain;Treatment limited secondary to medical complications (Comment) (HR 146. 02 sats 83% on 3L with activity)   Patient Left in chair;with call bell/phone within reach;with chair alarm set   Nurse Communication          Time: 1610-9604 OT Time Calculation (min): 32 min  Charges: OT General Charges $OT Visit: 1 Procedure OT Treatments $Self Care/Home Management : 8-22 mins  Evern Bio 09/28/2014, 10:25 AM  [phone]

## 2014-09-28 NOTE — Progress Notes (Signed)
Physical Therapy Treatment Patient Details Name: [name] MRN: [id number] DOB: 03-04-1926 Today's Date: 09/28/2014    History of Present Illness 81 yo female with onset of LLE pain and LE edema, discovered pleural effusion, CHF, cardiomegaly and hypoxia on admission.    PT Comments    Patient continues to remain significantly limited with overall mobility. Continues to required +2 maximal assist for OOB to chair transfers, unable to tolerated ambulation of any functional distance. At this time, do not feel patient is safe for dc home however, medical staff things that this may be patient's desire. If patient does return home she will need 24/7 physical assist and most likely required use of wheel chair, hospital bed, and a hoyer lift.   Follow Up Recommendations  SNF;  Do not feel patient is safe for d/c home but If patient does return home she will need 24/7 physical assist and most likely required use of wheel chair, hospital bed, and a hoyer lift and HHPT    Equipment Recommendations  IF RETURNING HOME: wheel chair, hospital bed, and a hoyer lift   Recommendations for Other Services       Precautions / Restrictions Precautions Precautions: Fall Precaution Comments: monitor O2 and HR Restrictions Weight Bearing Restrictions: No Other Position/Activity Restrictions: decreased tolerance of standing due to knee pain, unable to stand without B UE support    Mobility  Bed Mobility Overal bed mobility: Needs Assistance Bed Mobility: Rolling;Sidelying to Sit Rolling: Min assist Sidelying to sit: +2 for physical assistance;Mod assist       General bed mobility comments: assist to raise trunk and guide LEs off EOB  Transfers Overall transfer level: Needs assistance Equipment used: 2 person hand held assist Transfers: Sit to/from Stand;Stand Pivot Transfers Sit to Stand: +2 physical assistance;Mod assist Stand pivot transfers: +2 physical assistance;Max assist        General transfer comment: Assist to weight shift and to advance LEs, elevated bed surface  Ambulation/Gait             General Gait Details: Patient unable to tolerate ambulation this sesson despite +2 physical assist.    Stairs            Wheelchair Mobility    Modified Rankin (Stroke Patients Only)       Balance   Sitting-balance support: Feet supported Sitting balance-Leahy Scale: Fair Sitting balance - Comments: Tolerated EOB activity for >5 minutes   Standing balance support: Bilateral upper extremity supported Standing balance-Leahy Scale: Poor Standing balance comment: patient not able to maintain static upright positioning without increased physical assist                    Cognition Arousal/Alertness: Awake/alert Behavior During Therapy: WFL for tasks assessed/performed Overall Cognitive Status: No family/caregiver present to determine baseline cognitive functioning Area of Impairment: Memory     Memory: Decreased short-term memory              Exercises      General Comments        Pertinent Vitals/Pain Pain Assessment: Faces Faces Pain Scale: Hurts even more Pain Location: knees Pain Descriptors / Indicators: Grimacing Pain Intervention(s): Monitored during session;Limited activity within patient's tolerance;Repositioned    Home Living                      Prior Function            PT Goals (current goals can now be found in  the care plan section) Acute Rehab PT Goals Patient Stated Goal: agreeable to 2-3 days of rehab PT Goal Formulation: With patient Time For Goal Achievement: 10/05/14 Potential to Achieve Goals: Fair Progress towards PT goals: Progressing toward goals (modestly)    Frequency  Min 3X/week    PT Plan Current plan remains appropriate    Co-evaluation   Reason for Co-Treatment: For patient/therapist safety  PT goals addressed during session: Strength and mobility OT goals addressed  during session: ADL's and self-care     End of Session Equipment Utilized During Treatment: Gait belt;Oxygen Activity Tolerance: Patient limited by pain;Patient limited by fatigue Patient left: in chair;with call bell/phone within reach;with chair alarm set     Time: 1443-1540 PT Time Calculation (min) (ACUTE ONLY): 32 min  Charges:  $Therapeutic Activity: 8-22 mins                    G CodesFabio Asa Oct 04, 2014, 10:47 AM Charlotte Crumb, PT DPT  [phone]

## 2014-09-28 NOTE — Progress Notes (Addendum)
Advanced Heart Failure Rounding Note   Subjective:   Yesterday respiratory status improved and she was transitioned to nasal cannula. She continued to diurese with IV lasix. Weight down 1 pound. Diuresed 2.4 liters.   Denies SOB.    Objective:   Weight Range:  Vital Signs:   Temp:  [97.7 F (36.5 C)-98.6 F (37 C)] 97.7 F (36.5 C) (02/11 0259) Pulse Rate:  [62-91] 74 (02/11 0259) Resp:  [16-23] 17 (02/11 0259) BP: (103-133)/(59-75) 129/75 mmHg (02/11 0259) SpO2:  [94 %-98 %] 98 % (02/11 0040) Last BM Date: 09/27/14  Weight change: Filed Weights   09/24/14 0558 09/25/14 0528 09/26/14 0515  Weight: 179 lb 7.3 oz (81.4 kg) 178 lb 2.1 oz (80.8 kg) 177 lb 6.4 oz (80.468 kg)    Intake/Output:   Intake/Output Summary (Last 24 hours) at 09/28/14 0825 Last data filed at 09/28/14 0600  Gross per 24 hour  Intake    840 ml  Output   3176 ml  Net  -2336 ml     Physical Exam:  General: Elderly on FM. NAD HEENT: normal  Neck: supple. JVP to jaw.  Carotids 2+ bilat; no bruits. No lymphadenopathy or thryomegaly appreciated.  Cor: PMI nondisplaced. Irregular rate & rhythm. 2/6 MR/TR at apex  Lungs: dimished but clear Abdomen: soft, nontender, nondistended. No hepatosplenomegaly. No bruits or masses. Good bowel sounds.  Extremities: no cyanosis, clubbing, rash, R and LLE 1+ edema  Neuro: alert & orientedx3, cranial nerves grossly intact. moves all 4 extremities w/o difficulty. Affect pleasant   Telemetry: AF 70-80  Labs: Basic Metabolic Panel:  Recent Labs Lab 09/24/14 0545 09/25/14 1145 09/26/14 1037 09/27/14 0337 09/28/14 0451  NA 141 139 138 138 139  K 3.2* 3.6 4.1 4.4 4.7  CL 99 98 99 101 99  CO2 36* 35* 33* 33* 36*  GLUCOSE 121* 189* 169* 175* 137*  BUN 15 15 17 18 15   CREATININE 0.70 0.74 0.83 0.75 0.74  CALCIUM 7.8* 7.9* 8.1* 8.0* 8.3*    Liver Function Tests: No results for input(s): AST, ALT, ALKPHOS, BILITOT, PROT, ALBUMIN in the last 168 hours. No  results for input(s): LIPASE, AMYLASE in the last 168 hours. No results for input(s): AMMONIA in the last 168 hours.  CBC:  Recent Labs Lab 09/24/14 0545 09/25/14 1145 09/26/14 1037 09/27/14 0337 09/28/14 0451  WBC 2.1* 3.3* 2.6* 2.8* 2.4*  HGB 9.8* 10.1* 9.9* 9.6* 10.3*  HCT 32.1* 33.4* 32.8* 31.7* 34.3*  MCV 88.9 90.8 91.1 91.1 91.2  PLT 218 225 203 203 220    Cardiac Enzymes: No results for input(s): CKTOTAL, CKMB, CKMBINDEX, TROPONINI in the last 168 hours.  BNP: BNP (last 3 results)  Recent Labs  09/05/14 0955 09/20/14 1654  BNP 779.3* 549.9*    ProBNP (last 3 results)  Recent Labs  12/14/13 1846 02/17/14 1350 04/05/14 0855  PROBNP 4874.0* 3166.0* 4201.0*      Other results:    Imaging: No results found.   Medications:     Scheduled Medications: . albuterol  5 mg Nebulization Q6H  . antiseptic oral rinse  7 mL Mouth Rinse BID  . brimonidine  1 drop Left Eye BID  . dorzolamide  1 drop Left Eye BID  . furosemide  80 mg Intravenous BID  . insulin aspart  0-9 Units Subcutaneous TID WC  . lisinopril  5 mg Oral Daily  . metoprolol succinate  100 mg Oral Daily  . mometasone-formoterol  2 puff Inhalation BID  .  potassium chloride  40 mEq Oral BID  . rivaroxaban  20 mg Oral Q supper  . sodium chloride  3 mL Intravenous Q12H  . sodium chloride  3 mL Intravenous Q12H  . spironolactone  25 mg Oral Daily    Infusions:    PRN Medications: diclofenac sodium, HYDROcodone-acetaminophen, hydrOXYzine   Assessment:   1. Hypoxia likely due to volume overload.  2. A/C Systolic /Diastolic HF  --EF 20-25% with moderate to severe RV dysfunction  3. Chronic A fib Chads Vasc Score 6  4. H/O PE  5. Immobility  6. Leuopenia  7. Acute on chronic respiratory Failure - On nocturnal oxygen.  8. Hypokalemia   Plan/Discussion:    Respiratory status improved. Diuresed 2.4 liters. Weight down 1 pound. Renal function stable. Continue to diurese with IV  lasix.   Remains on nasal cannula.    CLEGG,AMY, NP-C  8:25 AM Advanced Heart Failure Team Pager [phone] (M-F; 7a - 4p)  Please contact [facility] for night-coverage after hours (4p -7a ) and weekends on [email or web address]  Patient seen and examined with Tonye Becket, NP. We discussed all aspects of the encounter. I agree with the assessment and plan as stated above.   Respiratory status much improved back on nasal cannula. AF rate still controlled. Continues to diurese well but down only 1 pound. Will give dose of metolazone to expedite and get her down to dry weight.   She is now DNR. PT recommending SNF.   Daniel Bensimhon,MD 9:42 AM

## 2014-09-28 NOTE — Progress Notes (Signed)
Subjective: [name]. She says she continues to feel that her breathing is better. She also noted she has a great appetite. She thinks her leg swelling is improved. I told her that we might move her back to the floor today since she is breathing well on nasal cannula oxygen. She says she is now considering SNF and will discuss with family.  Objective: Vital signs in last 24 hours: Filed Vitals:   09/27/14 1953 09/28/14 0040 09/28/14 0259 09/28/14 0825  BP: 124/61 103/61 129/75 143/54  Pulse: 84 62 74 81  Temp: 98.4 F (36.9 C) 97.7 F (36.5 C) 97.7 F (36.5 C) 97.8 F (36.6 C)  TempSrc: Oral Oral Oral Oral  Resp: Height:      Weight:      SpO2: 97% 98%  98%   Weight change:   Intake/Output Summary (Last 24 hours) at 09/28/14 1045 Last data filed at 09/28/14 0825  Gross per 24 hour  Intake    480 ml  Output   2651 ml  Net  -2171 ml   Gen: A&O x 4, no acute distress, sitting upright in bed on 3L nasal cannula HEENT: [place]/AT, EOMI, moist mucous membranes Heart: Irregularly irregular, no murmurs, rubs, or gallops Lungs: B/l expiratory wheezes, respirations unlabored while on 3L nasal cannula sating in the high 90s Abd: Soft, non-tender, non-distended, + bowel sounds Ext: 1+ b/l pitting edema up to knees, evidence of chronic venous stasis b/l, skin is very dry, ted hoes in place  Lab Results: Basic Metabolic Panel:  Recent Labs Lab 09/27/14 0337 09/28/14 0451  NA 138 139  K 4.4 4.7  CL 101 99  CO2 33* 36*  GLUCOSE 175* 137*  BUN 18 15  CREATININE 0.75 0.74  CALCIUM 8.0* 8.3*    CBC:  Recent Labs Lab 09/27/14 0337 09/28/14 0451  WBC 2.8* 2.4*  HGB 9.6* 10.3*  HCT 31.7* 34.3*  MCV 91.1 91.2  PLT 203 220   CBG:  Recent Labs Lab 09/27/14 0755 09/27/14 1205 09/27/14 1712 09/27/14 1956 09/28/14 0303 09/28/14 0814  GLUCAP 133* 189* 88 183* 136* 115*   BNP 550  ABG pH 7.40, pCO2 54, pO2 108, bicarb 33, on nonrebreather  TTE:  - Left  ventricle: The cavity size was normal. There was mild concentric hypertrophy. Systolic function was severely reduced. The estimated ejection fraction was in the range of 20% to 25%. Severe diffuse hypokinesis with no identifiable regional variations. - Ventricular septum: Septal motion showed abnormal function, dyssynergy, and paradox. The contour showed diastolic flattening. These changes are consistent with RV volume overload. - Aortic valve: There was mild regurgitation directed centrally in the LVOT. - Mitral valve: There was mild to moderate regurgitation directed centrally. The acceleration rate of the regurgitant jet was reduced, consistent with a low dP/dt. - Left atrium: The atrium was moderately to severely dilated. - Right ventricle: The cavity size was moderately dilated. Systolic function was moderately to severely reduced. - Right atrium: The atrium was severely dilated. - Atrial septum: A patent foramen ovale cannot be excluded. - Tricuspid valve: There was malcoaptation of the valve leaflets. There was severe regurgitation directed centrally. - Pulmonary arteries: Systolic pressure was moderately increased. PA peak pressure: 54 mm Hg (S). - Pericardium, extracardiac: A small, free-flowing pericardial effusion was identified along the right atrial free wall. There was a right pleural effusion. There was a left pleural effusion.  CXR 09/26/14  FINDINGS: Portable AP semi upright view at  0715 hours. Stable cardiac and mediastinal contours, with severe cardiomegaly. Visualized tracheal air column is within normal limits. No pneumothorax. Chronic retrocardiac hypoventilation. Interval Stable pulmonary vascularity, no overt edema. Probable small pleural effusions, stable. No areas of worsening ventilation.  IMPRESSION: No overt edema or areas of worsening ventilation. Stable chest with severe cardiomegaly, chronic retrocardiac hypoventilation,  and probable small pleural effusions.  CXR 09/25/14:  FINDINGS: Shallow inspiration. Diffusely enlarged cardiac silhouette with mild vascular congestion. Findings may be due to either a combination of cardiac enlargement and pericardial effusion. Probable small bilateral pleural effusions. Bilateral basilar atelectasis. No pneumothorax. Calcified and tortuous aorta. Appearances are similar prior study.  IMPRESSION: Cardiac enlargement with mild pulmonary vascular congestion. Probable small pleural effusions with basilar atelectasis bilaterally.  Medications: I have reviewed the patient's current medications. Scheduled Meds: . albuterol  5 mg Nebulization Q6H  . antiseptic oral rinse  7 mL Mouth Rinse BID  . brimonidine  1 drop Left Eye BID  . dorzolamide  1 drop Left Eye BID  . furosemide  80 mg Intravenous BID  . insulin aspart  0-9 Units Subcutaneous TID WC  . lisinopril  5 mg Oral Daily  . metolazone  2.5 mg Oral Daily  . metoprolol succinate  100 mg Oral Daily  . mometasone-formoterol  2 puff Inhalation BID  . potassium chloride  40 mEq Oral BID  . rivaroxaban  20 mg Oral Q supper  . sodium chloride  3 mL Intravenous Q12H  . sodium chloride  3 mL Intravenous Q12H  . spironolactone  25 mg Oral Daily   Continuous Infusions:  PRN Meds:.diclofenac sodium, HYDROcodone-acetaminophen, hydrOXYzine   Assessment/Plan:  AoC CHF: weight is down 11 pounds since admission but no weight recorded since 2/9.  Still edematous but greatly decreased from 3+ with daily improvement. She agrees that her volume overload is greatly improved.  I&Os are inaccurate.  Want to avoid Foley due to risk of infection and other objective measures seem to show slow but steady improvement in volume status. She is oxygenating back to her hospital baseline as she is in the high 90s on 3L nasal cannula. -transfer from step-down -appreciate cardiology recs -continue diuresis with lasix 80 mg iv bid -heart  failure added metolazone 2.5 mg po daily -cont toprol-xl 100 mg daily, lisinopril 5 mg daily, spironolactone 25 mg daily -KDur 40 mEq po bid -keep O2 > 92% -PT/OT -strict I&O -daily weights -norco 5-325 mg po q8hprn for severe leg pain; will add voltaren gel as well   Atrial fibrillation: Her RVR resolved on amiodarone drip which was discontinued 09/26/14. She continues to be in atrial fibrillation as she has prior but she is now appropriately rate controlled with HR 60s-70s.  -transfer to tele -metoprolol 100 mg daily -xarelto 20 mg daily -cardiac monitoring  Asthma: She is still wheezing on exam. Scheduled albuterol had been d/ced while in RVR but will now resume q6h. It should be scheduled as she will not ask for neb treatment on her own. - albuterol 2.5 mg neb q6h scheduled - continue Dulera 2 puff inh bid and oxygen supplementation  Anemia: stable since admission but FOB from admission is positive. She is on xarelto as noted above but no signs of bleeding.  Anemia panel c/w Fe deficiency. Will hold on GI consult given needed goals of care for patient and whether she is a candidate for further work-up. -cont to monitor  Pulmonary HTN: Ms Canty's PA 54 mm Hg is improved from 61  mmHg in 03/2014.There were pulmonary vascular changes noted on CXR. Cardiology feels she is not a candidate for cardiac catheterization. Goals of care discussion needed.  -appreciate cards  DM2: Last A1c 7.6 on 1/129/16. She takes metformin 1000 mg bid at home.  AM CBG 136 -hold metformin 1000 mg bid -continue SSI-sensitive  HTN: stable. At home she is on lisinopril 5 mg daily, toprol-xl 100 mg daily, and lasix 120 mg am, 80 mg pm. -cont lisinopril 5 mg daily, toprol-xl 100 mg daily, spironolactone 25 mg daily -lasix 80 mg iv bid as noted above -HF add metolazone 2.5 mg po daily  Leukopenia: WBC 2.4 today. WBC has ranged from 1.8 on presentation to 2.5. This appears to be stable near her baseline for the  past year. Differential distribution on admission 51% neutrophil, 29% lymphocyte, 16% monocyte, 3% eosinophil, 1% basophil.  Dispo: Disposition is deferred at this time, awaiting improvement of current medical problems.  Anticipated discharge in approximately 2 day(s). She adamantly refuses SNF which is recommended.  The patient does have a current PCP Yolanda Manges, DO) and does need an [facility] hospital follow-up appointment after discharge.  The patient does not know have transportation limitations that hinder transportation to clinic appointments.  .Services Needed at time of discharge: Y = Yes, Blank = No PT:   OT:   RN:   Equipment:   Other:     LOS: 7 days   Lorenda Hatchet, MD 09/28/2014, 10:45 AM

## 2014-09-29 DIAGNOSIS — E1165 Type 2 diabetes mellitus with hyperglycemia: Secondary | ICD-10-CM

## 2014-09-29 LAB — CBC
HEMATOCRIT: 32.9 % — AB (ref 36.0–46.0)
HEMOGLOBIN: 9.9 g/dL — AB (ref 12.0–15.0)
MCH: 27 pg (ref 26.0–34.0)
MCHC: 30.1 g/dL (ref 30.0–36.0)
MCV: 89.6 fL (ref 78.0–100.0)
Platelets: 217 10*3/uL (ref 150–400)
RBC: 3.67 MIL/uL — ABNORMAL LOW (ref 3.87–5.11)
RDW: 16 % — AB (ref 11.5–15.5)
WBC: 2.4 10*3/uL — AB (ref 4.0–10.5)

## 2014-09-29 LAB — BASIC METABOLIC PANEL
Anion gap: 7 (ref 5–15)
BUN: 16 mg/dL (ref 6–23)
CALCIUM: 8.5 mg/dL (ref 8.4–10.5)
CHLORIDE: 94 mmol/L — AB (ref 96–112)
CO2: 35 mmol/L — AB (ref 19–32)
CREATININE: 0.8 mg/dL (ref 0.50–1.10)
GFR calc Af Amer: 75 mL/min — ABNORMAL LOW (ref >90)
GFR calc non Af Amer: 65 mL/min — ABNORMAL LOW (ref >90)
Glucose, Bld: 142 mg/dL — ABNORMAL HIGH (ref 70–99)
Potassium: 4 mmol/L (ref 3.5–5.1)
Sodium: 136 mmol/L (ref 135–145)

## 2014-09-29 LAB — GLUCOSE, CAPILLARY
GLUCOSE-CAPILLARY: 165 mg/dL — AB (ref 70–99)
Glucose-Capillary: 152 mg/dL — ABNORMAL HIGH (ref 70–99)
Glucose-Capillary: 206 mg/dL — ABNORMAL HIGH (ref 70–99)
Glucose-Capillary: 159 mg/dL — ABNORMAL HIGH (ref 70–99)

## 2014-09-29 NOTE — Progress Notes (Signed)
Advanced Heart Failure Rounding Note   Subjective:    Metolazone added yesterday. -7L overnight. Weight down 5 more pounds.  16 pounds total.   Feels much better. Denies dyspnea.    Objective:   Weight Range:  Vital Signs:   Temp:  [97.8 F (36.6 C)-98 F (36.7 C)] 98 F (36.7 C) (02/12 1210) Pulse Rate:  [63-82] 78 (02/12 1210) Resp:  [14-22] 17 (02/12 1210) BP: (98-127)/(58-70) 101/70 mmHg (02/12 1210) SpO2:  [92 %-100 %] 96 % (02/12 1210) Weight:  [78.2 kg (172 lb 6.4 oz)] 78.2 kg (172 lb 6.4 oz) (02/12 0500) Last BM Date: 09/28/14  Weight change: Filed Weights   09/25/14 0528 09/26/14 0515 09/29/14 0500  Weight: 80.8 kg (178 lb 2.1 oz) 80.468 kg (177 lb 6.4 oz) 78.2 kg (172 lb 6.4 oz)    Intake/Output:   Intake/Output Summary (Last 24 hours) at 09/29/14 1228 Last data filed at 09/29/14 1112  Gross per 24 hour  Intake    460 ml  Output   8651 ml  Net  -8191 ml     Physical Exam:  General: Elderly on FM. NAD HEENT: normal  Neck: supple. JVP to jaw.  Carotids 2+ bilat; no bruits. No lymphadenopathy or thryomegaly appreciated.  Cor: PMI nondisplaced. Irregular rate & rhythm. 2/6 MR/TR at apex  Lungs: dimished but clear Abdomen: soft, nontender, nondistended. No hepatosplenomegaly. No bruits or masses. Good bowel sounds.  Extremities: no cyanosis, clubbing, rash, R and LLE 1+ edema  Neuro: alert & orientedx3, cranial nerves grossly intact. moves all 4 extremities w/o difficulty. Affect pleasant   Telemetry: AF 70-80  Labs: Basic Metabolic Panel:  Recent Labs Lab 09/25/14 1145 09/26/14 1037 09/27/14 0337 09/28/14 0451 09/29/14 0416  NA 139 138 138 139 136  K 3.6 4.1 4.4 4.7 4.0  CL 98 99 101 99 94*  CO2 35* 33* 33* 36* 35*  GLUCOSE 189* 169* 175* 137* 142*  BUN 15 17 18 15 16   CREATININE 0.74 0.83 0.75 0.74 0.80  CALCIUM 7.9* 8.1* 8.0* 8.3* 8.5    Liver Function Tests: No results for input(s): AST, ALT, ALKPHOS, BILITOT, PROT, ALBUMIN in the  last 168 hours. No results for input(s): LIPASE, AMYLASE in the last 168 hours. No results for input(s): AMMONIA in the last 168 hours.  CBC:  Recent Labs Lab 09/25/14 1145 09/26/14 1037 09/27/14 0337 09/28/14 0451 09/29/14 0416  WBC 3.3* 2.6* 2.8* 2.4* 2.4*  HGB 10.1* 9.9* 9.6* 10.3* 9.9*  HCT 33.4* 32.8* 31.7* 34.3* 32.9*  MCV 90.8 91.1 91.1 91.2 89.6  PLT 225 203 203 220 217    Cardiac Enzymes: No results for input(s): CKTOTAL, CKMB, CKMBINDEX, TROPONINI in the last 168 hours.  BNP: BNP (last 3 results)  Recent Labs  09/05/14 0955 09/20/14 1654  BNP 779.3* 549.9*    ProBNP (last 3 results)  Recent Labs  12/14/13 1846 02/17/14 1350 04/05/14 0855  PROBNP 4874.0* 3166.0* 4201.0*      Other results:    Imaging: No results found.   Medications:     Scheduled Medications: . albuterol  2.5 mg Nebulization TID  . antiseptic oral rinse  7 mL Mouth Rinse BID  . brimonidine  1 drop Left Eye BID  . dorzolamide  1 drop Left Eye BID  . furosemide  80 mg Intravenous BID  . insulin aspart  0-9 Units Subcutaneous TID WC  . lisinopril  5 mg Oral Daily  . metolazone  2.5 mg Oral Daily  .  metoprolol succinate  100 mg Oral Daily  . mometasone-formoterol  2 puff Inhalation BID  . potassium chloride  40 mEq Oral BID  . rivaroxaban  20 mg Oral Q supper  . sodium chloride  3 mL Intravenous Q12H  . sodium chloride  3 mL Intravenous Q12H  . spironolactone  25 mg Oral Daily    Infusions:    PRN Medications: diclofenac sodium, HYDROcodone-acetaminophen, hydrOXYzine   Assessment:   1. Hypoxia likely due to volume overload.  2. A/C Systolic /Diastolic HF  --EF 20-25% with moderate to severe RV dysfunction  3. Chronic A fib Chads Vasc Score 6  4. H/O PE  5. Immobility  6. Leuopenia  7. Acute on chronic respiratory Failure - On nocturnal oxygen.  8. Hypokalemia   Plan/Discussion:    Volume status much improved with addition of metolazone. But still  elevated. Will continue iv lasix and metolazone. Baseline weight in 160s. Respiratory status good. AF rate still controlled.   She is now DNR. PT recommending SNF. Suspect she will be ready on Monday.   Truman Hayward  12:28 PM Advanced Heart Failure Team Pager [phone] (M-F; 7a - 4p)  Please contact [facility] for night-coverage after hours (4p -7a ) and weekends on [email or web address]

## 2014-09-29 NOTE — Progress Notes (Signed)
Subjective: [name]. She has still not been transferred from step-down. She says she spoke to her daughter about disposition and her daughter does not want her to go to a nursing home. She say she will have 24 hr adult supervision at home between her children and grandchildren. We explained to her that this is a rehab facility to get her strength up and their goal is for her to ultimately become independent enough to go home. She gave [place] permission to speak with her daughter about disposition planning. She says this morning she is less short of breath and had a good appetite.   Objective: Vital signs in last 24 hours: Filed Vitals:   09/29/14 0400 09/29/14 0500 09/29/14 0746 09/29/14 0803  BP: 117/66  104/60   Pulse: 77  82   Temp:   98 F (36.7 C)   TempSrc:   Oral   Resp: 17  15   Height:      Weight:  172 lb 6.4 oz (78.2 kg)    SpO2: 100%  100% 93%   Weight change:   Intake/Output Summary (Last 24 hours) at 09/29/14 1105 Last data filed at 09/29/14 1610  Gross per 24 hour  Intake    460 ml  Output   7451 ml  Net  -6991 ml   Gen: A&O x 4, no acute distress, sitting upright in bed on 3L nasal cannula HEENT: /AT, EOMI, moist mucous membranes Heart: Irregularly irregular, no murmurs, rubs, or gallops Lungs: CTAB, respirations unlabored while on 3L nasal cannula sating in the high 90s Abd: Soft, non-tender, non-distended, + bowel sounds Ext: 1+ b/l pitting edema up to knees, evidence of chronic venous stasis b/l, skin is very dry, ted hoes in place  Lab Results: Basic Metabolic Panel:  Recent Labs Lab 09/28/14 0451 09/29/14 0416  NA 139 136  K 4.7 4.0  CL 99 94*  CO2 36* 35*  GLUCOSE 137* 142*  BUN 15 16  CREATININE 0.74 0.80  CALCIUM 8.3* 8.5    CBC:  Recent Labs Lab 09/28/14 0451 09/29/14 0416  WBC 2.4* 2.4*  HGB 10.3* 9.9*  HCT 34.3* 32.9*  MCV 91.2 89.6  PLT 220 217   CBG:  Recent Labs Lab 09/28/14 0303 09/28/14 0814 09/28/14 1237  09/28/14 1710 09/28/14 2140 09/29/14 0810  GLUCAP 136* 115* 202* 113* 179* 152*   BNP 550  ABG pH 7.40, pCO2 54, pO2 108, bicarb 33, on nonrebreather  TTE:  - Left ventricle: The cavity size was normal. There was mild concentric hypertrophy. Systolic function was severely reduced. The estimated ejection fraction was in the range of 20% to 25%. Severe diffuse hypokinesis with no identifiable regional variations. - Ventricular septum: Septal motion showed abnormal function, dyssynergy, and paradox. The contour showed diastolic flattening. These changes are consistent with RV volume overload. - Aortic valve: There was mild regurgitation directed centrally in the LVOT. - Mitral valve: There was mild to moderate regurgitation directed centrally. The acceleration rate of the regurgitant jet was reduced, consistent with a low dP/dt. - Left atrium: The atrium was moderately to severely dilated. - Right ventricle: The cavity size was moderately dilated. Systolic function was moderately to severely reduced. - Right atrium: The atrium was severely dilated. - Atrial septum: A patent foramen ovale cannot be excluded. - Tricuspid valve: There was malcoaptation of the valve leaflets. There was severe regurgitation directed centrally. - Pulmonary arteries: Systolic pressure was moderately increased. PA peak pressure: 54 mm Hg (S). - Pericardium,  extracardiac: A small, free-flowing pericardial effusion was identified along the right atrial free wall. There was a right pleural effusion. There was a left pleural effusion.  CXR 09/26/14  FINDINGS: Portable AP semi upright view at 0715 hours. Stable cardiac and mediastinal contours, with severe cardiomegaly. Visualized tracheal air column is within normal limits. No pneumothorax. Chronic retrocardiac hypoventilation. Interval Stable pulmonary vascularity, no overt edema. Probable small pleural effusions, stable. No  areas of worsening ventilation.  IMPRESSION: No overt edema or areas of worsening ventilation. Stable chest with severe cardiomegaly, chronic retrocardiac hypoventilation, and probable small pleural effusions.  CXR 09/25/14:  FINDINGS: Shallow inspiration. Diffusely enlarged cardiac silhouette with mild vascular congestion. Findings may be due to either a combination of cardiac enlargement and pericardial effusion. Probable small bilateral pleural effusions. Bilateral basilar atelectasis. No pneumothorax. Calcified and tortuous aorta. Appearances are similar prior study.  IMPRESSION: Cardiac enlargement with mild pulmonary vascular congestion. Probable small pleural effusions with basilar atelectasis bilaterally.  Medications: I have reviewed the patient's current medications. Scheduled Meds: . albuterol  2.5 mg Nebulization TID  . antiseptic oral rinse  7 mL Mouth Rinse BID  . brimonidine  1 drop Left Eye BID  . dorzolamide  1 drop Left Eye BID  . furosemide  80 mg Intravenous BID  . insulin aspart  0-9 Units Subcutaneous TID WC  . lisinopril  5 mg Oral Daily  . metolazone  2.5 mg Oral Daily  . metoprolol succinate  100 mg Oral Daily  . mometasone-formoterol  2 puff Inhalation BID  . potassium chloride  40 mEq Oral BID  . rivaroxaban  20 mg Oral Q supper  . sodium chloride  3 mL Intravenous Q12H  . sodium chloride  3 mL Intravenous Q12H  . spironolactone  25 mg Oral Daily   Continuous Infusions:  PRN Meds:.diclofenac sodium, HYDROcodone-acetaminophen, hydrOXYzine   Assessment/Plan:  AoC CHF: Weight is down 16 pounds since admission.  Still edematous but greatly decreased from 3+ with daily improvement. She agrees that her volume overload is greatly improved.  I&Os are inaccurate.  Want to avoid Foley due to risk of infection and other objective measures seem to show slow but steady improvement in volume status. She is oxygenating back to her hospital baseline as she is  in the high 90s on 3L nasal cannula. -transfer from step-down -appreciate cardiology recs -continue diuresis with lasix 80 mg iv bid -consider transition to oral diuresis pending cards recs -cont metolazone 2.5 mg po daily -cont toprol-xl 100 mg daily, lisinopril 5 mg daily, spironolactone 25 mg daily -KDur 40 mEq po bid -keep O2 > 92% -PT/OT -strict I&O -daily weights -norco 5-325 mg po q8hprn for severe leg pain; will add voltaren gel as well   Atrial fibrillation: Her RVR resolved on amiodarone drip which was discontinued 09/26/14. She continues to be in atrial fibrillation as she has prior but she is now appropriately rate controlled with HR 60s-90s.  -transfer to tele -metoprolol 100 mg daily -xarelto 20 mg daily -cardiac monitoring  Asthma: Wheezing resolved on exam this morning. Scheduled albuterol had been d/ced while in RVR but now resumed. It should be scheduled as she will not ask for neb treatment on her own. - albuterol 2.5 mg neb tid scheduled - continue Dulera 2 puff inh bid and oxygen supplementation  Anemia: stable since admission but FOB from admission is positive. She is on xarelto as noted above but no signs of bleeding.  Anemia panel c/w Fe deficiency. Will hold  on GI consult given goals of care for patient and whether she is a candidate for further work-up. -cont to monitor  Pulmonary HTN: Ms Stegemann's PA 54 mm Hg is improved from 61 mmHg in 03/2014.There were pulmonary vascular changes noted on CXR. Cardiology feels she is not a candidate for cardiac catheterization. Goals of care discussion needed.  -appreciate cards  DM2: Last A1c 7.6 on 1/129/16. She takes metformin 1000 mg bid at home.  AM CBG 142 -hold metformin 1000 mg bid -continue SSI-sensitive  HTN: stable. At home she is on lisinopril 5 mg daily, toprol-xl 100 mg daily, and lasix 120 mg am, 80 mg pm. -cont lisinopril 5 mg daily, toprol-xl 100 mg daily, spironolactone 25 mg daily -lasix 80 mg iv bid as  noted above -cont metolazone 2.5 mg po daily  Leukopenia: WBC 2.4 today. WBC has ranged from 1.8 on presentation to 2.5. This appears to be stable near her baseline for the past year. Differential distribution on admission 51% neutrophil, 29% lymphocyte, 16% monocyte, 3% eosinophil, 1% basophil.  Dispo: Disposition is deferred at this time, awaiting improvement of current medical problems.  Anticipated discharge in approximately 2 day(s). She adamantly refuses SNF which is recommended.  The patient does have a current PCP Yolanda Manges, DO) and does need an [facility] hospital follow-up appointment after discharge.  The patient does not know have transportation limitations that hinder transportation to clinic appointments.  .Services Needed at time of discharge: Y = Yes, Blank = No PT: Y  OT: Y  RN: Y  Equipment:   Other:     LOS: 8 days   Lorenda Hatchet, MD 09/29/2014, 11:05 AM

## 2014-09-29 NOTE — Progress Notes (Signed)
09/29/2014 Patient foley cath was removed at 1228. She had 1000cc of light yellow urine. [facility] RN.

## 2014-09-29 NOTE — Progress Notes (Signed)
09/29/2014 Patient voided 150cc at 1244 in bedpan. Louanne Calvillo RN

## 2014-09-29 NOTE — Progress Notes (Signed)
Ms Zayli Schuring transferred to 3E03 from 2C09.  Report received from [place].  CCMD called to activate telemetry.  Showing A. Fib on monitor.  Pt alert and oriented and daughter at bedside.  No c/o pain.  VSs. Patient and daughter stated that it was discussed for her to go to nursing home and this is not their wish.  Daughter mentioned that her mother has changed her mind and wants to be a complete code and not do not resuscitate.  Will call MD to inform and remove DNR bracelet at present.

## 2014-09-30 LAB — BASIC METABOLIC PANEL
ANION GAP: 9 (ref 5–15)
BUN: 19 mg/dL (ref 6–23)
CO2: 36 mmol/L — ABNORMAL HIGH (ref 19–32)
Calcium: 8.2 mg/dL — ABNORMAL LOW (ref 8.4–10.5)
Chloride: 89 mmol/L — ABNORMAL LOW (ref 96–112)
Creatinine, Ser: 0.77 mg/dL (ref 0.50–1.10)
GFR calc Af Amer: 86 mL/min — ABNORMAL LOW (ref >90)
GFR, EST NON AFRICAN AMERICAN: 74 mL/min — AB (ref >90)
GLUCOSE: 229 mg/dL — AB (ref 70–99)
Potassium: 3.9 mmol/L (ref 3.5–5.1)
Sodium: 134 mmol/L — ABNORMAL LOW (ref 135–145)

## 2014-09-30 LAB — CBC
HCT: 35.5 % — ABNORMAL LOW (ref 36.0–46.0)
Hemoglobin: 10.7 g/dL — ABNORMAL LOW (ref 12.0–15.0)
MCH: 26.9 pg (ref 26.0–34.0)
MCHC: 30.1 g/dL (ref 30.0–36.0)
MCV: 89.2 fL (ref 78.0–100.0)
Platelets: 239 10*3/uL (ref 150–400)
RBC: 3.98 MIL/uL (ref 3.87–5.11)
RDW: 16.5 % — AB (ref 11.5–15.5)
WBC: 2.6 10*3/uL — AB (ref 4.0–10.5)

## 2014-09-30 LAB — GLUCOSE, CAPILLARY
GLUCOSE-CAPILLARY: 206 mg/dL — AB (ref 70–99)
Glucose-Capillary: 128 mg/dL — ABNORMAL HIGH (ref 70–99)
Glucose-Capillary: 184 mg/dL — ABNORMAL HIGH (ref 70–99)
Glucose-Capillary: 270 mg/dL — ABNORMAL HIGH (ref 70–99)

## 2014-09-30 MED ORDER — METOPROLOL TARTRATE 1 MG/ML IV SOLN
5.0000 mg | Freq: Once | INTRAVENOUS | Status: AC
  Administered 2014-09-30: 5 mg via INTRAVENOUS
  Filled 2014-09-30: qty 5

## 2014-09-30 NOTE — Progress Notes (Signed)
Subjective:  No complaints of shortness of breath.  Objective:  Vital Signs in the last 24 hours: BP 110/59 mmHg  Pulse 76  Temp(Src) 98.4 F (36.9 C) (Oral)  Resp 18  Ht 5\' 5"  (1.651 m)  Wt 76.932 kg (169 lb 9.7 oz)  BMI 28.22 kg/m2  SpO2 98%  Physical Exam: Elderly obese female in no acute distress Lungs:  Clear  Cardiac:  Irregular rhythm, normal S1 and S2, no S3, JVP up Extremities:  Legs wrinkled with 1+ edema   Intake/Output from previous day: 02/12 0701 - 02/13 0700 In: 660 [P.O.:660] Out: 3051 [Urine:3050; Stool:1] Weight Filed Weights   09/29/14 0500 09/29/14 1758 09/30/14 0603  Weight: 78.2 kg (172 lb 6.4 oz) 77.1 kg (169 lb 15.6 oz) 76.932 kg (169 lb 9.7 oz)    Lab Results: Basic Metabolic Panel:  Recent Labs  66/06/30 0416 09/30/14 0914  NA 136 134*  K 4.0 3.9  CL 94* 89*  CO2 35* 36*  GLUCOSE 142* 229*  BUN 16 19  CREATININE 0.80 0.77    CBC:  Recent Labs  09/29/14 0416 09/30/14 0914  WBC 2.4* 2.6*  HGB 9.9* 10.7*  HCT 32.9* 35.5*  MCV 89.6 89.2  PLT 217 239    BNP    Component Value Date/Time   BNP 549.9* 09/20/2014 1654    Telemetry: A trial fibrillation  Assessment/Plan:  1.  Acute on chronic systolic heart failure 2.  Chronic atrial fibrillation   Recommendations:  3 L diuresis yesterday.  Metolazone hopefully helping.  Skilled nursing on Monday.     Darden Palmer  MD [facility]  09/30/2014, 11:08 AM

## 2014-09-30 NOTE — Progress Notes (Signed)
Subjective: [name] called the night MD and changed her code status to full code. This was confirmed on rounds this morning and she also said that she will not go to SNF even though we explained how this would be much safer on discharge than home for now. She says she feels better this morning and is not short of breath. She says her legs do not hurt. Her only complaint is being hungry.  Objective: Vital signs in last 24 hours: Filed Vitals:   09/29/14 2114 09/30/14 0205 09/30/14 0603 09/30/14 0822  BP: 100/68 101/69 110/59   Pulse: 82 80 76   Temp: 98.6 F (37 C) 98.4 F (36.9 C) 98.4 F (36.9 C)   TempSrc: Oral Oral Oral   Resp: Height:      Weight:   169 lb 9.7 oz (76.932 kg)   SpO2: 93% 97%  98%   Weight change: -2 lb 6.8 oz (-1.1 kg)  Intake/Output Summary (Last 24 hours) at 09/30/14 9147 Last data filed at 09/29/14 2300  Gross per 24 hour  Intake    660 ml  Output   3051 ml  Net  -2391 ml   Gen: A&O x 4, no acute distress, sitting upright in bed on 3L nasal cannula HEENT: [place]/AT, EOMI, moist mucous membranes Heart: Irregularly irregular, no murmurs, rubs, or gallops Lungs: CTAB, respirations unlabored while on 3L nasal cannula sating in the high 90s Abd: Soft, non-tender, non-distended, + bowel sounds Ext: 1+ b/l pitting edema up to knees, evidence of chronic venous stasis b/l, skin is very dry  Lab Results: Basic Metabolic Panel:  Recent Labs Lab 09/28/14 0451 09/29/14 0416  NA 139 136  K 4.7 4.0  CL 99 94*  CO2 36* 35*  GLUCOSE 137* 142*  BUN 15 16  CREATININE 0.74 0.80  CALCIUM 8.3* 8.5    CBC:  Recent Labs Lab 09/28/14 0451 09/29/14 0416  WBC 2.4* 2.4*  HGB 10.3* 9.9*  HCT 34.3* 32.9*  MCV 91.2 89.6  PLT 220 217   CBG:  Recent Labs Lab 09/28/14 2140 09/29/14 0810 09/29/14 1209 09/29/14 1653 09/29/14 2210 09/30/14 0608  GLUCAP 179* 152* 165* 206* 159* 128*   BNP 550  ABG pH 7.40, pCO2 54, pO2 108, bicarb 33, on  nonrebreather  TTE:  - Left ventricle: The cavity size was normal. There was mild concentric hypertrophy. Systolic function was severely reduced. The estimated ejection fraction was in the range of 20% to 25%. Severe diffuse hypokinesis with no identifiable regional variations. - Ventricular septum: Septal motion showed abnormal function, dyssynergy, and paradox. The contour showed diastolic flattening. These changes are consistent with RV volume overload. - Aortic valve: There was mild regurgitation directed centrally in the LVOT. - Mitral valve: There was mild to moderate regurgitation directed centrally. The acceleration rate of the regurgitant jet was reduced, consistent with a low dP/dt. - Left atrium: The atrium was moderately to severely dilated. - Right ventricle: The cavity size was moderately dilated. Systolic function was moderately to severely reduced. - Right atrium: The atrium was severely dilated. - Atrial septum: A patent foramen ovale cannot be excluded. - Tricuspid valve: There was malcoaptation of the valve leaflets. There was severe regurgitation directed centrally. - Pulmonary arteries: Systolic pressure was moderately increased. PA peak pressure: 54 mm Hg (S). - Pericardium, extracardiac: A small, free-flowing pericardial effusion was identified along the right atrial free wall. There was a right pleural effusion. There was a  left pleural effusion.  CXR 09/26/14  FINDINGS: Portable AP semi upright view at 0715 hours. Stable cardiac and mediastinal contours, with severe cardiomegaly. Visualized tracheal air column is within normal limits. No pneumothorax. Chronic retrocardiac hypoventilation. Interval Stable pulmonary vascularity, no overt edema. Probable small pleural effusions, stable. No areas of worsening ventilation.  IMPRESSION: No overt edema or areas of worsening ventilation. Stable chest with severe cardiomegaly, chronic  retrocardiac hypoventilation, and probable small pleural effusions.  CXR 09/25/14:  FINDINGS: Shallow inspiration. Diffusely enlarged cardiac silhouette with mild vascular congestion. Findings may be due to either a combination of cardiac enlargement and pericardial effusion. Probable small bilateral pleural effusions. Bilateral basilar atelectasis. No pneumothorax. Calcified and tortuous aorta. Appearances are similar prior study.  IMPRESSION: Cardiac enlargement with mild pulmonary vascular congestion. Probable small pleural effusions with basilar atelectasis bilaterally.  Medications: I have reviewed the patient's current medications. Scheduled Meds: . albuterol  2.5 mg Nebulization TID  . antiseptic oral rinse  7 mL Mouth Rinse BID  . brimonidine  1 drop Left Eye BID  . dorzolamide  1 drop Left Eye BID  . furosemide  80 mg Intravenous BID  . insulin aspart  0-9 Units Subcutaneous TID WC  . lisinopril  5 mg Oral Daily  . metoprolol succinate  100 mg Oral Daily  . mometasone-formoterol  2 puff Inhalation BID  . potassium chloride  40 mEq Oral BID  . rivaroxaban  20 mg Oral Q supper  . sodium chloride  3 mL Intravenous Q12H  . sodium chloride  3 mL Intravenous Q12H  . spironolactone  25 mg Oral Daily   Continuous Infusions:  PRN Meds:.diclofenac sodium, HYDROcodone-acetaminophen, hydrOXYzine   Assessment/Plan:  AoC CHF: Weight is down 19 pounds since admission.  Still edematous but greatly decreased from 3+ with daily improvement. She agrees that her volume overload is greatly improved.  I&Os are inaccurate but recorded net -19.4L.  Want to avoid Foley due to risk of infection and other objective measures seem to show slow but steady improvement in volume status. She is oxygenating back to her hospital baseline as she is in the high 90s on 3L nasal cannula. She says at home she uses home O2 at night and sometimes during the day. She was transferred from step-down with no issues  yesterday. -appreciate cardiology recs -continue diuresis with lasix 80 mg iv bid -consider transition to oral diuresis pending cards recs -metolazone 2.5 mg po daily d/ced by cards -cont toprol-xl 100 mg daily, lisinopril 5 mg daily, spironolactone 25 mg daily -KDur 40 mEq po bid -keep O2 > 92% -PT/OT -strict I&O -daily weights -norco 5-325 mg po q8hprn for severe leg pain; will add voltaren gel as well   Atrial fibrillation: Her RVR resolved on amiodarone drip which was discontinued 09/26/14. She continues to be in atrial fibrillation as she has prior but she is now appropriately rate controlled with HR 70s-80s.  -metoprolol 100 mg daily -xarelto 20 mg daily -cardiac monitoring  Asthma: Wheezing resolved on exam this morning. Scheduled albuterol had been d/ced while in RVR but now resumed. It should be scheduled as she will not ask for neb treatment on her own. - albuterol 2.5 mg neb tid scheduled - continue Dulera 2 puff inh bid and oxygen supplementation  Anemia: stable since admission but FOB from admission is positive. This morning's CBC pending. She is on xarelto as noted above but no signs of bleeding.  Anemia panel c/w Fe deficiency. Will hold on GI consult  given goals of care for patient and whether she is a candidate for further work-up. -cont to monitor  Pulmonary HTN: [name]'s PA 54 mm Hg is improved from 61 mmHg in 03/2014.There were pulmonary vascular changes noted on CXR. Cardiology feels she is not a candidate for cardiac catheterization. Goals of care discussion needed.  -appreciate cards  DM2: Last A1c 7.6 on 1/129/16. She takes metformin 1000 mg bid at home.  AM CBG 128 -hold metformin 1000 mg bid -continue SSI-sensitive  HTN: stable. At home she is on lisinopril 5 mg daily, toprol-xl 100 mg daily, and lasix 120 mg am, 80 mg pm. -cont lisinopril 5 mg daily, toprol-xl 100 mg daily, spironolactone 25 mg daily -lasix 80 mg iv bid as noted above -cont metolazone 2.5  mg po daily  Leukopenia: WBC 2.4 yesterday, today pending. WBC has ranged from 1.8 on presentation to 2.5. This appears to be stable near her baseline for the past year. Differential distribution on admission 51% neutrophil, 29% lymphocyte, 16% monocyte, 3% eosinophil, 1% basophil.  Dispo: Disposition is deferred at this time, awaiting improvement of current medical problems.  Anticipated discharge in approximately 2 day(s). She adamantly refuses SNF which is recommended.  The patient does have a current PCP Wendy Manges, DO) and does need an [facility] hospital follow-up appointment after discharge.  The patient does not know have transportation limitations that hinder transportation to clinic appointments.  .Services Needed at time of discharge: Y = Yes, Blank = No PT: Y  OT: Y  RN: Y  Equipment:   Other:     LOS: 9 days   Lorenda Hatchet, MD 09/30/2014, 9:07 AM

## 2014-10-01 ENCOUNTER — Other Ambulatory Visit: Payer: Self-pay | Admitting: Internal Medicine

## 2014-10-01 LAB — GLUCOSE, CAPILLARY
GLUCOSE-CAPILLARY: 177 mg/dL — AB (ref 70–99)
GLUCOSE-CAPILLARY: 227 mg/dL — AB (ref 70–99)
GLUCOSE-CAPILLARY: 160 mg/dL — AB (ref 70–99)
Glucose-Capillary: 137 mg/dL — ABNORMAL HIGH (ref 70–99)

## 2014-10-01 MED ORDER — METOPROLOL SUCCINATE ER 50 MG PO TB24
50.0000 mg | ORAL_TABLET | Freq: Every day | ORAL | Status: DC
  Administered 2014-10-01: 50 mg via ORAL
  Filled 2014-10-01 (×3): qty 1

## 2014-10-01 MED ORDER — METOLAZONE 2.5 MG PO TABS
2.5000 mg | ORAL_TABLET | Freq: Once | ORAL | Status: AC
  Administered 2014-10-01: 2.5 mg via ORAL
  Filled 2014-10-01: qty 1

## 2014-10-01 NOTE — Progress Notes (Addendum)
Advanced Heart Failure Rounding Note   Subjective:    Weight stable overnight. Down a total of 19 pounds.   Feels much better. Denies dyspnea.  Heart rate 80-90s. Occasional spikes to 100-105   Objective:   Weight Range:  Vital Signs:   Temp:  [98 F (36.7 C)-98.8 F (37.1 C)] 98 F (36.7 C) (02/14 0650) Pulse Rate:  [65-104] 83 (02/14 0650) Resp:  [16-18] 18 (02/14 0650) BP: (93-108)/(38-86) 108/58 mmHg (02/14 0650) SpO2:  [95 %-100 %] 98 % (02/14 0737) Weight:  [77 kg (169 lb 12.1 oz)] 77 kg (169 lb 12.1 oz) (02/14 0650) Last BM Date: 09/28/14  Weight change: Filed Weights   09/29/14 1758 09/30/14 0603 10/01/14 0650  Weight: 77.1 kg (169 lb 15.6 oz) 76.932 kg (169 lb 9.7 oz) 77 kg (169 lb 12.1 oz)    Intake/Output:   Intake/Output Summary (Last 24 hours) at 10/01/14 3837 Last data filed at 10/01/14 0701  Gross per 24 hour  Intake   1140 ml  Output     50 ml  Net   1090 ml     Physical Exam:  General: Elderly  Sitting up in bed. NAD HEENT: normal  Neck: supple. JVP 8.  Carotids 2+ bilat; no bruits. No lymphadenopathy or thryomegaly appreciated.  Cor: PMI nondisplaced. Irregular rate & rhythm. 2/6 MR/TR at apex  Lungs: dimished but clear Abdomen: soft, nontender, nondistended. No hepatosplenomegaly. No bruits or masses. Good bowel sounds.  Extremities: no cyanosis, clubbing, rash, R and LLE tr-1+ edema  Neuro: alert & orientedx3, cranial nerves grossly intact. moves all 4 extremities w/o difficulty. Affect pleasant    Telemetry: AF 80-90s occasional spikes to 105  Labs: Basic Metabolic Panel:  Recent Labs Lab 09/26/14 1037 09/27/14 0337 09/28/14 0451 09/29/14 0416 09/30/14 0914  NA 138 138 139 136 134*  K 4.1 4.4 4.7 4.0 3.9  CL 99 101 99 94* 89*  CO2 33* 33* 36* 35* 36*  GLUCOSE 169* 175* 137* 142* 229*  BUN 17 18 15 16 19   CREATININE 0.83 0.75 0.74 0.80 0.77  CALCIUM 8.1* 8.0* 8.3* 8.5 8.2*    Liver Function Tests: No results for input(s):  AST, ALT, ALKPHOS, BILITOT, PROT, ALBUMIN in the last 168 hours. No results for input(s): LIPASE, AMYLASE in the last 168 hours. No results for input(s): AMMONIA in the last 168 hours.  CBC:  Recent Labs Lab 09/26/14 1037 09/27/14 0337 09/28/14 0451 09/29/14 0416 09/30/14 0914  WBC 2.6* 2.8* 2.4* 2.4* 2.6*  HGB 9.9* 9.6* 10.3* 9.9* 10.7*  HCT 32.8* 31.7* 34.3* 32.9* 35.5*  MCV 91.1 91.1 91.2 89.6 89.2  PLT 203 203 220 217 239    Cardiac Enzymes: No results for input(s): CKTOTAL, CKMB, CKMBINDEX, TROPONINI in the last 168 hours.  BNP: BNP (last 3 results)  Recent Labs  09/05/14 0955 09/20/14 1654  BNP 779.3* 549.9*    ProBNP (last 3 results)  Recent Labs  12/14/13 1846 02/17/14 1350 04/05/14 0855  PROBNP 4874.0* 3166.0* 4201.0*      Other results:    Imaging: No results found.   Medications:     Scheduled Medications: . albuterol  2.5 mg Nebulization TID  . antiseptic oral rinse  7 mL Mouth Rinse BID  . brimonidine  1 drop Left Eye BID  . dorzolamide  1 drop Left Eye BID  . furosemide  80 mg Intravenous BID  . insulin aspart  0-9 Units Subcutaneous TID WC  . lisinopril  5 mg Oral  Daily  . metoprolol succinate  100 mg Oral Daily  . mometasone-formoterol  2 puff Inhalation BID  . potassium chloride  40 mEq Oral BID  . rivaroxaban  20 mg Oral Q supper  . sodium chloride  3 mL Intravenous Q12H  . sodium chloride  3 mL Intravenous Q12H  . spironolactone  25 mg Oral Daily    Infusions:    PRN Medications: diclofenac sodium, HYDROcodone-acetaminophen, hydrOXYzine   Assessment:   1. Hypoxia likely due to volume overload.  2. A/C Systolic /Diastolic HF  --EF 20-25% with moderate to severe RV dysfunction  3. Chronic A fib Chads Vasc Score 6  4. H/O PE  5. Immobility  6. Leuopenia  7. Acute on chronic respiratory Failure - On nocturnal oxygen.  8. Hypokalemia   Plan/Discussion:    Volume status much improved but still slightly  elevated. Will give one more dose of metolazone. Baseline weight in 160s. Respiratory status good. AF rate up slightly - will increase Toprol carefully.   Refuses SNF despite severe deconditioning. Will need HHPT.   [name]  8:32 AM Advanced Heart Failure Team Pager [phone] (M-F; 7a - 4p)  Please contact [facility] for night-coverage after hours (4p -7a ) and weekends on [email or web address]

## 2014-10-01 NOTE — Progress Notes (Signed)
  Date: 10/01/2014  Patient name: [name]  Medical record number: [id number]  Date of birth: 1926-05-13   This patient has been seen and the plan of care was discussed with the house staff. Please see their note for complete details. I concur with their findings with the following additions/corrections: Still bc tachy per RN but not recorded. This occurs when pt is at rest and occurs at all times. HF team is carefully increasing BB and giving one extra dose metolazone today. [name] is refusing SNF and will go home once stable.   Burns Spain, MD 10/01/2014, 11:34 AM

## 2014-10-01 NOTE — Progress Notes (Signed)
Subjective: [name]. [name] still notes that her breathing feels better and leg pain has largely resolved. We told her that she is likely close to discharge. She still insists she will not go to SNF at discharge but will have 24 hour supervision from family at home.  Objective: Vital signs in last 24 hours: Filed Vitals:   09/30/14 2143 10/01/14 0650 10/01/14 0737 10/01/14 0900  BP: 93/86 108/58  110/82  Pulse: 65 83  94  Temp: 98.8 F (37.1 C) 98 F (36.7 C)  98.4 F (36.9 C)  TempSrc: Oral Oral  Oral  Resp: Height:      Weight:  169 lb 12.1 oz (77 kg)    SpO2: 96% 100% 98% 99%   Weight change: -3.5 oz (-0.1 kg)  Intake/Output Summary (Last 24 hours) at 10/01/14 1246 Last data filed at 10/01/14 0900  Gross per 24 hour  Intake   1140 ml  Output      0 ml  Net   1140 ml   Gen: A&O x 4, no acute distress, sitting upright in bed on 3L nasal cannula HEENT: [place]/AT, EOMI, moist mucous membranes Heart: Irregularly irregular, no murmurs, rubs, or gallops Lungs: CTAB, respirations unlabored while on 3L nasal cannula sating in the high 90s Abd: Soft, non-tender, non-distended, + bowel sounds Ext: 1+ b/l pitting edema up to knees, evidence of chronic venous stasis b/l, skin is very dry  Lab Results: Basic Metabolic Panel:  Recent Labs Lab 09/29/14 0416 09/30/14 0914  NA 136 134*  K 4.0 3.9  CL 94* 89*  CO2 35* 36*  GLUCOSE 142* 229*  BUN 16 19  CREATININE 0.80 0.77  CALCIUM 8.5 8.2*    CBC:  Recent Labs Lab 09/29/14 0416 09/30/14 0914  WBC 2.4* 2.6*  HGB 9.9* 10.7*  HCT 32.9* 35.5*  MCV 89.6 89.2  PLT 217 239   CBG:  Recent Labs Lab 09/30/14 0608 09/30/14 1107 09/30/14 1645 09/30/14 2054 10/01/14 0705 10/01/14 1113  GLUCAP 128* 184* 270* 206* 177* 137*   BNP 550  ABG pH 7.40, pCO2 54, pO2 108, bicarb 33, on nonrebreather  TTE:  - Left ventricle: The cavity size was normal. There was mild concentric hypertrophy. Systolic function  was severely reduced. The estimated ejection fraction was in the range of 20% to 25%. Severe diffuse hypokinesis with no identifiable regional variations. - Ventricular septum: Septal motion showed abnormal function, dyssynergy, and paradox. The contour showed diastolic flattening. These changes are consistent with RV volume overload. - Aortic valve: There was mild regurgitation directed centrally in the LVOT. - Mitral valve: There was mild to moderate regurgitation directed centrally. The acceleration rate of the regurgitant jet was reduced, consistent with a low dP/dt. - Left atrium: The atrium was moderately to severely dilated. - Right ventricle: The cavity size was moderately dilated. Systolic function was moderately to severely reduced. - Right atrium: The atrium was severely dilated. - Atrial septum: A patent foramen ovale cannot be excluded. - Tricuspid valve: There was malcoaptation of the valve leaflets. There was severe regurgitation directed centrally. - Pulmonary arteries: Systolic pressure was moderately increased. PA peak pressure: 54 mm Hg (S). - Pericardium, extracardiac: A small, free-flowing pericardial effusion was identified along the right atrial free wall. There was a right pleural effusion. There was a left pleural effusion.  CXR 09/26/14  FINDINGS: Portable AP semi upright view at 0715 hours. Stable cardiac and mediastinal contours, with severe cardiomegaly. Visualized tracheal  air column is within normal limits. No pneumothorax. Chronic retrocardiac hypoventilation. Interval Stable pulmonary vascularity, no overt edema. Probable small pleural effusions, stable. No areas of worsening ventilation.  IMPRESSION: No overt edema or areas of worsening ventilation. Stable chest with severe cardiomegaly, chronic retrocardiac hypoventilation, and probable small pleural effusions.  CXR 09/25/14:  FINDINGS: Shallow inspiration. Diffusely  enlarged cardiac silhouette with mild vascular congestion. Findings may be due to either a combination of cardiac enlargement and pericardial effusion. Probable small bilateral pleural effusions. Bilateral basilar atelectasis. No pneumothorax. Calcified and tortuous aorta. Appearances are similar prior study.  IMPRESSION: Cardiac enlargement with mild pulmonary vascular congestion. Probable small pleural effusions with basilar atelectasis bilaterally.  Medications: I have reviewed the patient's current medications. Scheduled Meds: . albuterol  2.5 mg Nebulization TID  . antiseptic oral rinse  7 mL Mouth Rinse BID  . brimonidine  1 drop Left Eye BID  . dorzolamide  1 drop Left Eye BID  . furosemide  80 mg Intravenous BID  . insulin aspart  0-9 Units Subcutaneous TID WC  . lisinopril  5 mg Oral Daily  . metolazone  2.5 mg Oral Once  . metoprolol succinate  100 mg Oral Daily  . metoprolol succinate  50 mg Oral Daily  . mometasone-formoterol  2 puff Inhalation BID  . potassium chloride  40 mEq Oral BID  . rivaroxaban  20 mg Oral Q supper  . sodium chloride  3 mL Intravenous Q12H  . sodium chloride  3 mL Intravenous Q12H  . spironolactone  25 mg Oral Daily   Continuous Infusions:  PRN Meds:.diclofenac sodium, hydrOXYzine   Assessment/Plan:  AoC CHF: Weight is down 19 pounds since admission.  Still edematous but greatly decreased from 3+ with daily improvement. She agrees that her volume overload is greatly improved.  I&Os are inaccurate but recorded net -17.9L.  She is oxygenating back to her hospital baseline as she is in the high 90s on 3L nasal cannula. She says at home she uses home O2 at night and sometimes during the day. Cardiology feels she probably needs one more day of metolazone but is likely close to discharge. [name] refuses to go to SNF on discharge and says she will have 24 hour supervision at home from family. -appreciate cardiology recs -continue diuresis with  lasix 80 mg iv bid -metolazone 2.5 mg po once -consider transition to oral diuresis pending cards recs -lisinopril 5 mg daily, spironolactone 25 mg daily -toprol-xl 100 mg am, 50 mg pm due to episodes of tachycardia -KDur 40 mEq po bid -keep O2 > 92% -PT/OT -strict I&O -daily weights  Atrial fibrillation: Her RVR resolved on amiodarone drip which was discontinued 09/26/14. She continues to be in atrial fibrillation as she has prior. RN notes she has sporadically become tachycardic up to 140s but highest documented 104. HF up-titrating by adding toprol-xl 50 mg qhs to toprol-xl 100 mg daily -toprol-xl 100 mg am, 50 mg pm -xarelto 20 mg daily -cardiac monitoring  Asthma: Wheezing resolved on exam. Should be scheduled as she will not ask for neb treatment on her own. - albuterol 2.5 mg neb tid scheduled - continue Dulera 2 puff inh bid and oxygen supplementation  Anemia: stable since admission but FOB from admission is positive. She is on xarelto as noted above but no signs of bleeding.  Anemia panel c/w Fe deficiency. Will hold on GI consult given goals of care for patient and whether she is a candidate for further work-up. -cont  to monitor  Pulmonary HTN: Ms Trusty's PA 54 mm Hg is improved from 61 mmHg in 03/2014.There were pulmonary vascular changes noted on CXR. Cardiology feels she is not a candidate for cardiac catheterization. Goals of care discussion needed.  -appreciate cards  DM2: Last A1c 7.6 on 1/129/16. She takes metformin 1000 mg bid at home.  AM CBG 137 -hold metformin 1000 mg bid -continue SSI-sensitive  HTN: stable. At home she is on lisinopril 5 mg daily, toprol-xl 100 mg daily, and lasix 120 mg am, 80 mg pm. -cont lisinopril 5 mg daily, toprol-xl 100 mg daily with toprol-xl 50 mg qhs, spironolactone 25 mg daily -lasix 80 mg iv bid as noted above -metolazone 2.5 mg po once  Leukopenia: WBC has ranged from 1.8 on presentation to 2.5. This appears to be stable near her  baseline for the past year. Differential distribution on admission 51% neutrophil, 29% lymphocyte, 16% monocyte, 3% eosinophil, 1% basophil.  Dispo: Disposition is deferred at this time, awaiting improvement of current medical problems.  Anticipated discharge in approximately 1-2 day(s). She adamantly refuses SNF which is recommended.  The patient does have a current PCP Yolanda Manges, DO) and does need an [facility] hospital follow-up appointment after discharge.  The patient does not know have transportation limitations that hinder transportation to clinic appointments.  .Services Needed at time of discharge: Y = Yes, Blank = No PT: Y  OT: Y  RN: Y  Equipment:   Other:     LOS: 10 days   Lorenda Hatchet, MD 10/01/2014, 12:46 PM

## 2014-10-02 LAB — BASIC METABOLIC PANEL
ANION GAP: 7 (ref 5–15)
BUN: 26 mg/dL — AB (ref 6–23)
CHLORIDE: 93 mmol/L — AB (ref 96–112)
CO2: 37 mmol/L — ABNORMAL HIGH (ref 19–32)
CREATININE: 0.86 mg/dL (ref 0.50–1.10)
Calcium: 8.2 mg/dL — ABNORMAL LOW (ref 8.4–10.5)
GFR calc Af Amer: 69 mL/min — ABNORMAL LOW (ref >90)
GFR calc non Af Amer: 59 mL/min — ABNORMAL LOW (ref >90)
GLUCOSE: 146 mg/dL — AB (ref 70–99)
POTASSIUM: 3.8 mmol/L (ref 3.5–5.1)
Sodium: 137 mmol/L (ref 135–145)

## 2014-10-02 LAB — CBC
HCT: 31.4 % — ABNORMAL LOW (ref 36.0–46.0)
HEMOGLOBIN: 9.4 g/dL — AB (ref 12.0–15.0)
MCH: 26.9 pg (ref 26.0–34.0)
MCHC: 29.9 g/dL — ABNORMAL LOW (ref 30.0–36.0)
MCV: 89.7 fL (ref 78.0–100.0)
Platelets: 240 10*3/uL (ref 150–400)
RBC: 3.5 MIL/uL — AB (ref 3.87–5.11)
RDW: 16.8 % — ABNORMAL HIGH (ref 11.5–15.5)
WBC: 2.7 10*3/uL — ABNORMAL LOW (ref 4.0–10.5)

## 2014-10-02 LAB — GLUCOSE, CAPILLARY
GLUCOSE-CAPILLARY: 150 mg/dL — AB (ref 70–99)
Glucose-Capillary: 180 mg/dL — ABNORMAL HIGH (ref 70–99)

## 2014-10-02 MED ORDER — FUROSEMIDE 40 MG PO TABS
120.0000 mg | ORAL_TABLET | Freq: Every day | ORAL | Status: DC
  Administered 2014-10-02: 120 mg via ORAL
  Filled 2014-10-02: qty 1

## 2014-10-02 MED ORDER — METOPROLOL SUCCINATE ER 50 MG PO TB24: 50.0000 mg | ORAL_TABLET | Freq: Two times a day (BID) | ORAL | Status: DC

## 2014-10-02 MED ORDER — METOLAZONE 2.5 MG PO TABS: 2.5000 mg | ORAL_TABLET | ORAL | Status: DC

## 2014-10-02 MED ORDER — ALBUTEROL SULFATE (2.5 MG/3ML) 0.083% IN NEBU: 2.5000 mg | INHALATION_SOLUTION | Freq: Four times a day (QID) | RESPIRATORY_TRACT | Status: DC | PRN

## 2014-10-02 MED ORDER — SPIRONOLACTONE 25 MG PO TABS: 25.0000 mg | ORAL_TABLET | Freq: Every day | ORAL | Status: DC

## 2014-10-02 MED ORDER — FUROSEMIDE 80 MG PO TABS: 120.0000 mg | ORAL_TABLET | Freq: Two times a day (BID) | ORAL | Status: DC

## 2014-10-02 MED ORDER — RIVAROXABAN 20 MG PO TABS: 20.0000 mg | ORAL_TABLET | Freq: Every day | ORAL | Status: DC

## 2014-10-02 MED ORDER — METOLAZONE 2.5 MG PO TABS
2.5000 mg | ORAL_TABLET | ORAL | Status: DC
  Administered 2014-10-02: 2.5 mg via ORAL
  Filled 2014-10-02: qty 1

## 2014-10-02 NOTE — Progress Notes (Signed)
Advanced Heart Failure Rounding Note   Subjective:    Weight stable overnight. Down a total of  23 pounds. Yesterday she diuresed with IV lasix + metolazone and toprol was increased.   Denies SOB.      Objective:   Weight Range:  Vital Signs:   Temp:  [98 F (36.7 C)-98.3 F (36.8 C)] 98.1 F (36.7 C) (02/15 0527) Pulse Rate:  [79-112] 79 (02/14 2042) Resp:  [18] 18 (02/15 0527) BP: (108-115)/(65-79) 108/65 mmHg (02/15 0527) SpO2:  [93 %-100 %] 100 % (02/15 0935) Weight:  [165 lb 9.1 oz (75.1 kg)] 165 lb 9.1 oz (75.1 kg) (02/15 0527) Last BM Date: 10/01/14  Weight change: Filed Weights   09/30/14 0603 10/01/14 0650 10/02/14 0527  Weight: 169 lb 9.7 oz (76.932 kg) 169 lb 12.1 oz (77 kg) 165 lb 9.1 oz (75.1 kg)    Intake/Output:   Intake/Output Summary (Last 24 hours) at 10/02/14 0942 Last data filed at 10/02/14 1027  Gross per 24 hour  Intake    723 ml  Output    300 ml  Net    423 ml     Physical Exam:  General: Elderly  Sitting in the chair. NAD HEENT: normal  Neck: supple. JVP 6-7  Carotids 2+ bilat; no bruits. No lymphadenopathy or thryomegaly appreciated.  Cor: PMI nondisplaced. Irregular rate & rhythm. 2/6 MR/TR at apex  Lungs: dimished but clear Abdomen: soft, nontender, nondistended. No hepatosplenomegaly. No bruits or masses. Good bowel sounds.  Extremities: no cyanosis, clubbing, rash, R and LLE tr-1+ edema  Neuro: alert & orientedx3, cranial nerves grossly intact. moves all 4 extremities w/o difficulty. Affect pleasant    Telemetry: AF 80-90s Labs: Basic Metabolic Panel:  Recent Labs Lab 09/27/14 0337 09/28/14 0451 09/29/14 0416 09/30/14 0914 10/02/14 0325  NA 138 139 136 134* 137  K 4.4 4.7 4.0 3.9 3.8  CL 101 99 94* 89* 93*  CO2 33* 36* 35* 36* 37*  GLUCOSE 175* 137* 142* 229* 146*  BUN 18 15 16 19  26*  CREATININE 0.75 0.74 0.80 0.77 0.86  CALCIUM 8.0* 8.3* 8.5 8.2* 8.2*    Liver Function Tests: No results for input(s): AST, ALT,  ALKPHOS, BILITOT, PROT, ALBUMIN in the last 168 hours. No results for input(s): LIPASE, AMYLASE in the last 168 hours. No results for input(s): AMMONIA in the last 168 hours.  CBC:  Recent Labs Lab 09/27/14 0337 09/28/14 0451 09/29/14 0416 09/30/14 0914 10/02/14 0325  WBC 2.8* 2.4* 2.4* 2.6* 2.7*  HGB 9.6* 10.3* 9.9* 10.7* 9.4*  HCT 31.7* 34.3* 32.9* 35.5* 31.4*  MCV 91.1 91.2 89.6 89.2 89.7  PLT 203 220 217 239 240    Cardiac Enzymes: No results for input(s): CKTOTAL, CKMB, CKMBINDEX, TROPONINI in the last 168 hours.  BNP: BNP (last 3 results)  Recent Labs  09/05/14 0955 09/20/14 1654  BNP 779.3* 549.9*    ProBNP (last 3 results)  Recent Labs  12/14/13 1846 02/17/14 1350 04/05/14 0855  PROBNP 4874.0* 3166.0* 4201.0*      Other results:    Imaging: No results found.   Medications:     Scheduled Medications: . albuterol  2.5 mg Nebulization TID  . antiseptic oral rinse  7 mL Mouth Rinse BID  . brimonidine  1 drop Left Eye BID  . dorzolamide  1 drop Left Eye BID  . furosemide  80 mg Intravenous BID  . insulin aspart  0-9 Units Subcutaneous TID WC  . lisinopril  5 mg  Oral Daily  . metoprolol succinate  100 mg Oral Daily  . metoprolol succinate  50 mg Oral Daily  . mometasone-formoterol  2 puff Inhalation BID  . potassium chloride  40 mEq Oral BID  . rivaroxaban  20 mg Oral Q supper  . sodium chloride  3 mL Intravenous Q12H  . sodium chloride  3 mL Intravenous Q12H  . spironolactone  25 mg Oral Daily    Infusions:    PRN Medications: diclofenac sodium, hydrOXYzine   Assessment:   1. Hypoxia likely due to volume overload.  2. A/C Systolic /Diastolic HF  --EF 20-25% with moderate to severe RV dysfunction  3. Chronic A fib Chads Vasc Score 6  4. H/O PE  5. Immobility  6. Leuopenia  7. Acute on chronic respiratory Failure - On nocturnal oxygen.  8. Hypokalemia   Plan/Discussion:    Volume status much improved. Stop IV lasix and  metolazone. Start po lasix 120 mg twice a day. Use metolazone 2.5 mg once a week every Friday. Will schedule HF follow up for next week.     Respiratory status good. AF rate improved on toprol 100/50.   Refuses SNF despite severe deconditioning. Will need HHPT.   CLEGG,AMY NP-C  9:42 AM Advanced Heart Failure Team Pager [phone] (M-F; 7a - 4p)  Please contact [facility] for night-coverage after hours (4p -7a ) and weekends on [email or web address]  Patient seen and examined with Tonye Becket, NP. We discussed all aspects of the encounter. I agree with the assessment and plan as stated above.   She is much improved. Agree with above regimen. I think she will need metolazone at least once a week to maintain volume status. Can go home today or tomorrow from our standpoint. Will need f/u in HF Clinic next week.  We will place appt on chart.   Daniel Bensimhon,MD 10:23 AM

## 2014-10-02 NOTE — Discharge Summary (Signed)
Name: [name] MRN: [id number] DOB: June 23, 1926 81 y.o. PCP: Yolanda Manges, DO  Date of Admission: 09/20/2014  5:46 PM Date of Discharge: 10/02/2014 Attending Physician: Gardiner Barefoot, MD  Discharge Diagnosis:  Principal Problem:   Acute on chronic combined systolic and diastolic heart failure Active Problems:   Diabetes   Glaucoma   Essential hypertension   Chronic combined systolic and diastolic congestive heart failure   Asthma in adult   Hypokalemia   Current use of long term anticoagulation--xarelto   Leukopenia   Hypoxia  Discharge Medications:   Medication List    TAKE these medications        acetaminophen 500 MG tablet  Commonly known as:  TYLENOL  Take 1,000 mg by mouth every 6 (six) hours as needed (leg pain).     albuterol 108 (90 BASE) MCG/ACT inhaler  Commonly known as:  PROVENTIL HFA;VENTOLIN HFA  Inhale 1-2 puffs into the lungs every 6 (six) hours as needed for shortness of breath.     brimonidine 0.1 % Soln  Commonly known as:  ALPHAGAN P  Place 1 drop into the left eye every 12 (twelve) hours.     furosemide 80 MG tablet  Commonly known as:  LASIX  Take 1.5 tablets (120 mg total) by mouth 2 (two) times daily. Take 1.5 tabs in morning, and 1 tab in the evening.     lisinopril 5 MG tablet  Commonly known as:  PRINIVIL,ZESTRIL  TAKE 1 TABLET BY MOUTH EVERY DAY     metFORMIN 1000 MG tablet  Commonly known as:  GLUCOPHAGE  TAKE 1 TABLET BY MOUTH TWICE DAILY WITH A MEAL     metolazone 2.5 MG tablet  Commonly known as:  ZAROXOLYN  Take 1 tablet (2.5 mg total) by mouth once a week. On Fridays     metoprolol succinate 50 MG 24 hr tablet  Commonly known as:  TOPROL-XL  Take 1-2 tablets (50-100 mg total) by mouth 2 (two) times daily. Take 100 mg in the morning, 50 mg in the evening     mometasone-formoterol 100-5 MCG/ACT Aero  Commonly known as:  DULERA  Inhale 2 puffs into the lungs 2 (two) times daily.     potassium chloride SA 20 MEQ  tablet  Commonly known as:  K-DUR,KLOR-CON  TAKE 2 TABLETS BY MOUTH DAILY     rivaroxaban 20 MG Tabs tablet  Commonly known as:  XARELTO  Take 1 tablet (20 mg total) by mouth daily with supper.     spironolactone 25 MG tablet  Commonly known as:  ALDACTONE  Take 1 tablet (25 mg total) by mouth daily.     TRUSOPT 2 % ophthalmic solution  Generic drug:  dorzolamide  Place 1 drop into the left eye 2 (two) times daily.        Disposition and follow-up:   [name] was discharged from [facility] in Serious condition.  At the hospital follow up visit please address:  1.  Volume status, rate control, code status, independence at home  2.  Labs / imaging needed at time of follow-up: BMP (on diuretics), CBC (ensure hemoglobin and leukopenia stable)  3.  Pending labs/ test needing follow-up: none  Follow-up Appointments: Follow-up Information    Follow up with Arvilla Meres, MD On 10/09/2014.   Specialty:  Cardiology   Why:  Garage Code 0700 At 10:30    Contact information:   [address] [place] [address]  [phone]       Follow up with Evelena Peat, DO. Call in 1 day.   Specialty:  Internal Medicine   Why:  [facility] will call you to schedule (closed for weather today)   Contact information:   [address] [place] [address] [phone]       Discharge Instructions: Discharge Instructions    Diet - low sodium heart healthy    Complete by:  As directed      Increase activity slowly    Complete by:  As directed            Consultations: Treatment Team:  Rounding L[facility]cardiology, MD  Procedures Performed:  Dg Chest 2 View  09/20/2014   CLINICAL DATA:  81 year old female with hypoxia. History of atrial fibrillation.  EXAM: CHEST  2 VIEW  COMPARISON:  Chest x-ray 09/05/2014.  FINDINGS: Small bilateral pleural effusions. No consolidative airspace disease. No evidence of pulmonary edema. The continues to be some  cephalization of the pulmonary vasculature. Heart size is moderately enlarged. Prominence of upper mediastinal soft tissues similar to numerous prior examinations, corresponding to prominent vascular structures and enlarged masslike left thyroid lobe on prior chest CT 08/18/2013, which explains the right-sided tracheal deviation on today's examination. Allowing for differences in positioning between today's study and prior studies, these findings are essentially unchanged.  IMPRESSION: 1. Cardiomegaly and pulmonary venous congestion, with resolution of previously noted mild interstitial pulmonary edema. 2. Small bilateral pleural effusions. 3. Atherosclerosis. 4. Additional incidental findings, similar prior studies, as above.   Electronically Signed   By: Trudie Reed M.D.   On: 09/20/2014 21:07   Dg Chest 2 View  09/05/2014   CLINICAL DATA:  Shortness of breath.  EXAM: CHEST  2 VIEW  COMPARISON:  None.  FINDINGS: Cardiomegaly with pulmonary vascular prominence. Mild interstitial prominence. Similar findings noted on prior exam. The sinus consistent mild congestive heart failure. Bibasilar subsegmental atelectasis. Small pleural effusions. No pneumothorax. No acute bony abnormality.  IMPRESSION: 1. Severe cardiomegaly again noted. 2. Findings consistent with mild congestive heart failure again noted. Similar finding noted on prior study 10/07/2013.   Electronically Signed   By: Maisie Fus  Register   On: 09/05/2014 13:05   Dg Chest Port 1 View  09/26/2014   CLINICAL DATA:  81 year old female with congestive heart failure, worsening shortness of breath despite improved fluid status. Initial encounter.  EXAM: PORTABLE CHEST - 1 VIEW  COMPARISON:  09/25/2014 and earlier.  FINDINGS: Portable AP semi upright view at 0715 hours. Stable cardiac and mediastinal contours, with severe cardiomegaly. Visualized tracheal air column is within normal limits. No pneumothorax. Chronic retrocardiac hypoventilation. Interval  Stable pulmonary vascularity, no overt edema. Probable small pleural effusions, stable. No areas of worsening ventilation.  IMPRESSION: No overt edema or areas of worsening ventilation. Stable chest with severe cardiomegaly, chronic retrocardiac hypoventilation, and probable small pleural effusions.   Electronically Signed   By: Odessa Fleming M.D.   On: 09/26/2014 07:39   Dg Chest Port 1 View  09/25/2014   CLINICAL DATA:  Acute exacerbation of heart failure. Current admission for diuresis.  EXAM: PORTABLE CHEST - 1 VIEW  COMPARISON:  09/20/2014  FINDINGS: Shallow inspiration. Diffusely enlarged cardiac silhouette with mild vascular congestion. Findings may be due to either a combination of cardiac enlargement and pericardial effusion. Probable small bilateral pleural effusions. Bilateral basilar atelectasis. No pneumothorax. Calcified and tortuous aorta. Appearances are similar prior study.  IMPRESSION: Cardiac enlargement with mild pulmonary vascular congestion. Probable small  pleural effusions with basilar atelectasis bilaterally.   Electronically Signed   By: Burman Nieves M.D.   On: 09/25/2014 17:24    2D Echo: - Left ventricle: The cavity size was normal. There was mild concentric hypertrophy. Systolic function was severely reduced. The estimated ejection fraction was in the range of 20% to 25%. Severe diffuse hypokinesis with no identifiable regional variations. - Ventricular septum: Septal motion showed abnormal function, dyssynergy, and paradox. The contour showed diastolic flattening. These changes are consistent with RV volume overload. - Aortic valve: There was mild regurgitation directed centrally in the LVOT. - Mitral valve: There was mild to moderate regurgitation directed centrally. The acceleration rate of the regurgitant jet was reduced, consistent with a low dP/dt. - Left atrium: The atrium was moderately to severely dilated. - Right ventricle: The cavity size was  moderately dilated. Systolic function was moderately to severely reduced. - Right atrium: The atrium was severely dilated. - Atrial septum: A patent foramen ovale cannot be excluded. - Tricuspid valve: There was malcoaptation of the valve leaflets. There was severe regurgitation directed centrally. - Pulmonary arteries: Systolic pressure was moderately increased. PA peak pressure: 54 mm Hg (S). - Pericardium, extracardiac: A small, free-flowing pericardial effusion was identified along the right atrial free wall. There was a right pleural effusion. There was a left pleural effusion.  Admission HPI: 81 yo female with PMH Afib, PE (on xarelto 15mg  daily), HTN, combined CHF (EF 40-45%,) DM II, asthma, chronic leg swelling, here with increased leg swelling (1 month) and leg pain (3 days). She was found to be hypoxic to 86% in clinic, improved to 90's with 4L o2. Has 2 pillow orthopnea at baseline, DOE, uses 2L o2 at night only.  Today's weight is 190 lbs, 179 on 07/2014. Has been seen at CHF clinic 12/15 and IM clinic few times. Had lasix dose increased for increased weight/LE swelling and SOB. Has hx of noncompliance with salt and not keeping her feet elevated. She has been compliant with all of her meds. Her daughter gives those to her. Has overall leg pain, around her knees, more on left, no localization of pain to her calves. Has dry cough and baseline SOB. No chest pain. No pain with deep inspiration. No fever/chills. Has been urinating a lot with lasix, no dysuria.   Hospital Course by problem list: Principal Problem:   Acute on chronic combined systolic and diastolic heart failure Active Problems:   Diabetes   Glaucoma   Essential hypertension   Chronic combined systolic and diastolic congestive heart failure   Asthma in adult   Hypokalemia   Current use of long term anticoagulation--xarelto   Leukopenia   Hypoxia   #Acute on chronic CHF: [name] presented 30 lbs above  dry weight with 3+ pitting edema. Her echo showed EF 25-30% with severe diffuse hypokineses, deteriorated from 03/2014 echo with EF 35-40%. She was aggressively diuresed over nearly two week with lasix 80 mg iv bid with great input from the heart failure team. She is oxygenating back to her baseline as she is in the high 90s on 3L nasal cannula. She says at home she uses home O2 at night and sometimes during the day. Her symptoms improved as she is now 1+ pitting edema b/l. The leg pain from swelling has resolved. Her lungs are clear. Her weight is down 23 pounds since admission.She agrees that her volume overload is greatly improved. I&Os are inaccurate but recorded net -17.5L. Cardiology recommended discharge with lasix  120 mg po bid and metolazone 2.5 mg po every Friday and will follow-up as out-patient. She was also started on spironolactone 25 mg daily and her beta blocker was increased to toprol-xl 100 mg am, 50 mg pm as described below.   #Goals of care: [name] and her family were approached several times by several different providers during her stay regarding goals of care. We recommended SNF at discharge but she is adamant that she will not go because she is afraid she will never be discharged from SNF like others she knows. We have clearly given recommendation for SNF but ordered home health PT/OT/RN as patient insists she will have 24 hour supervision at home with all of her children and grandchildren taking turns assisting. We have also ordered a hospital bed, wheelchair, and hoyer lift. She changed her code status to DNR after discussion with daughter [name] but several days later told MD team she changed her mind and would prefer to be full code again. PCP should engage on-going goals of care conversations at each visit.   #Atrial fibrillation: [name] is rate controlled with metoprolol and on xarelto. She came in on xarelto 15 mg but this was increased to 20 mg. She had an episode of a  fib with RVR with HR into the 140s on 2/8 and was started on an amiodarone drip. She then deoxygenated to 82% on 4L and required nonrebreather and transfer to step-down. Cause of acute deoxygenation remains unclear. Her CHF was improving and CXR did not show pulmonary edema. She was on xarelto in terms of anti-coagulation. ABG showed good oxygenation and stable from a year prior without acidosis or alkalosis. Amiodarone was stopped and she was weaned back to 3L nasal cannula and has since had good oxygenation. On 2/14 RN noticed HR was going into low 100s and heart failure up-titrated her beta blocker by adding toprol-xl 50 mg qhs to toprol-xl 100 mg daily and rate now lostly in 70s.  #Asthma: [name] occasionally had some wheezing on exam. We had her on scheduled albuterol as she will not ask for neb treatment on her own.We continued Dulera 2 puff inh bid and oxygen supplementation  #Anemia: Presenting hemoglobin 10.9 and 9.4 prior to discharge. As hemoglobin was stable since admission, no further work-up was performed on positive FOB on admission but was monitored daily. She is on xarelto as noted above but no signs of bleeding. Anemia panel c/w Fe deficiency. As noted above, PCP should revisit goals of care with [name] and her family.  #Pulmonary HTN: [name]'s PA 54 mm Hg is improved from 61 mmHg in 03/2014.There were pulmonary vascular changes noted on CXR. Cardiology feels she is not a candidate for cardiac catheterization.   #DM2: Last A1c 7.6 on 1/129/16. She takes metformin 1000 mg bid at home.She was on a sensitive sliding scale here and well controlled so should resume home metformin on discharge.   #HTN: Stable. At home she is on lisinopril 5 mg daily, toprol-xl 100 mg daily, and lasix 120 mg am, 80 mg pm. On discharge, she will be on lisinopril 5 mg daily, toprol-xl 100 mg daily with toprol-xl 50 mg qhs, spironolactone 25 mg daily, lasix 120 mg po bid, and metolazone 2.5 mg po every  Friday  #Leukopenia: WBC has ranged from 1.8 on presentation to 2.7 today. This appears to be stable near her baseline for the past year. Differential distribution on admission 51% neutrophil, 29% lymphocyte, 16% monocyte, 3% eosinophil,  1% basophil.  Discharge Vitals:   BP 108/65 mmHg  Pulse 79  Temp(Src) 98.1 F (36.7 C) (Oral)  Resp 18  Ht 5\' 5"  (1.651 m)  Wt 165 lb 9.1 oz (75.1 kg)  BMI 27.55 kg/m2  SpO2 100%   Discharge Physical Exam: Gen: A&O x 4, no acute distress, sitting upright in bed on 3L nasal cannula HEENT: [place]/AT, EOMI, moist mucous membranes Heart: Irregularly irregular, no murmurs, rubs, or gallops Lungs: CTAB, respirations unlabored while on 3L nasal cannula sating in the high 90s Abd: Soft, non-tender, non-distended, + bowel sounds Ext: 1+ b/l pitting edema up to knees, evidence of chronic venous stasis b/l, skin is very dry  Discharge Labs:  Basic Metabolic Panel:  Recent Labs Lab 09/30/14 0914 10/02/14 0325  NA 134* 137  K 3.9 3.8  CL 89* 93*  CO2 36* 37*  GLUCOSE 229* 146*  BUN 19 26*  CREATININE 0.77 0.86  CALCIUM 8.2* 8.2*  CBC:  Recent Labs Lab 09/30/14 0914 10/02/14 0325  WBC 2.6* 2.7*  HGB 10.7* 9.4*  HCT 35.5* 31.4*  MCV 89.2 89.7  PLT 239 240   CBG:  Recent Labs Lab 09/30/14 2054 10/01/14 0705 10/01/14 1113 10/01/14 1602 10/01/14 2056 10/02/14 0553  GLUCAP 206* 177* 137* 227* 160* 150*   Signed: Lorenda Hatchet, MD 10/02/2014, 11:53 AM    Services Ordered on Discharge: home health PT, OT, RN Equipment Ordered on Discharge: hospital bed, wheel chair, hoyer lift

## 2014-10-02 NOTE — Discharge Instructions (Signed)
It was a pleasure to care for you at [facility]. We have made some medication changes. You are now to take toprol-xl 100 mg in the morning and 50 mg in the evening. You are now to take lasix 120 mg by mouth twice a day. Every Friday you are to take metolazone 2.5 mg once. We also started a medicine called spironolactone 25 mg once daily. We have also increased your xarelto to 20 mg daily. You are to see the heart failure doctors on 2/22 at 10:30 am. The [facility] clinic is closed today but will call you to schedule an appointment (we asked them to schedule you for later in the day next Monday when you already have a cardiology appointment). Please return to the Emergency Department or seek medical attention if you have any new or worsening trouble breathing, chest pain, or other worrisome medical condition.   Farley Ly, MD    Heart Failure Heart failure means your heart has trouble pumping blood. This makes it hard for your body to work well. Heart failure is usually a long-term (chronic) condition. You must take good care of yourself and follow your doctor's treatment plan. HOME CARE  Take your heart medicine as told by your doctor.  Do not stop taking medicine unless your doctor tells you to.  Do not skip any dose of medicine.  Refill your medicines before they run out.  Take other medicines only as told by your doctor or pharmacist.  Stay active if told by your doctor. The elderly and people with severe heart failure should talk with a doctor about physical activity.  Eat heart-healthy foods. Choose foods that are without trans fat and are low in saturated fat, cholesterol, and salt (sodium). This includes fresh or frozen fruits and vegetables, fish, lean meats, fat-free or low-fat dairy foods, whole grains, and high-fiber foods. Lentils and dried peas and beans (legumes) are also good choices.  Limit salt if told by your doctor.  Cook in a healthy way. Roast, grill, broil, bake, poach,  steam, or stir-fry foods.  Limit fluids as told by your doctor.  Weigh yourself every morning. Do this after you pee (urinate) and before you eat breakfast. Write down your weight to give to your doctor.  Take your blood pressure and write it down if your doctor tells you to.  Ask your doctor how to check your pulse. Check your pulse as told.  Lose weight if told by your doctor.  Stop smoking or chewing tobacco. Do not use gum or patches that help you quit without your doctor's approval.  Schedule and go to doctor visits as told.  Nonpregnant women should have no more than 1 drink a day. Men should have no more than 2 drinks a day. Talk to your doctor about drinking alcohol.  Stop illegal drug use.  Stay current with shots (immunizations).  Manage your health conditions as told by your doctor.  Learn to manage your stress.  Rest when you are tired.  If it is really hot outside:  Avoid intense activities.  Use air conditioning or fans, or get in a cooler place.  Avoid caffeine and alcohol.  Wear loose-fitting, lightweight, and light-colored clothing.  If it is really cold outside:  Avoid intense activities.  Layer your clothing.  Wear mittens or gloves, a hat, and a scarf when going outside.  Avoid alcohol.  Learn about heart failure and get support as needed.  Get help to maintain or improve your quality  of life and your ability to care for yourself as needed. GET HELP IF:   You gain 03 lb/1.4 kg or more in 1 day or 05 lb/2.3 kg in a week.  You are more short of breath than usual.  You cannot do your normal activities.  You tire easily.  You cough more than normal, especially with activity.  You have any or more puffiness (swelling) in areas such as your hands, feet, ankles, or belly (abdomen).  You cannot sleep because it is hard to breathe.  You feel like your heart is beating fast (palpitations).  You get dizzy or light-headed when you stand  up. GET HELP RIGHT AWAY IF:   You have trouble breathing.  There is a change in mental status, such as becoming less alert or not being able to focus.  You have chest pain or discomfort.  You faint. MAKE SURE YOU:   Understand these instructions.  Will watch your condition.  Will get help right away if you are not doing well or get worse. Document Released: 05/13/2008 Document Revised: 12/19/2013 Document Reviewed: 09/20/2012 [facility] Patient Information 2015 [place], [place]. This information is not intended to replace advice given to you by your health care provider. Make sure you discuss any questions you have with your health care provider.

## 2014-10-02 NOTE — Progress Notes (Signed)
Physical Therapy Treatment Patient Details Name: [name] MRN: [id number] DOB: 1926-06-02 Today's Date: 10/02/2014    History of Present Illness 81 yo female with onset of LLE pain and LE edema, discovered pleural effusion, CHF, cardiomegaly and hypoxia on admission.    PT Comments    Patient seen for mobility attempts this session. Patient assist to EOB and attempted ambulation x3 unsuccessfully despite increased assist. Patient unable to initiate stride or take steps at this time. Patient then assist OOB to chair with transfer requiring 2 persons assist.  Continue to feel patient would benefit from SNF, however, patient and family desire discharge home. Recommend that family provide 24/7 physical assist for patient and anticipate that patient will be w/c level for mobility, may need to consider hoyer lift.   Follow Up Recommendations  SNF (pt refusing, will need 24/7 physical assist)     Equipment Recommendations  None recommended by PT    Recommendations for Other Services       Precautions / Restrictions Precautions Precautions: Fall Precaution Comments: monitor O2 Restrictions Weight Bearing Restrictions: No    Mobility  Bed Mobility Overal bed mobility: Needs Assistance Bed Mobility: Rolling;Sidelying to Sit Rolling: Min assist Sidelying to sit: Mod assist       General bed mobility comments: VCs for positioning to perform roll to sidelying and sit, assist to elevate trunk to upright position with cues for breathing during process  Transfers Overall transfer level: Needs assistance Equipment used: Rolling walker (2 wheeled) (face to face with chuck pad and gait belt) Transfers: Sit to/from Stand Sit to Stand: Mod assist;+2 physical assistance Stand pivot transfers: Max assist;+2 physical assistance       General transfer comment: Patient continues to have difficulty coming to standing, required +2 face to face assist with gait belt and chuck pad, max two  person assist to pivot to chair.  Patient unable to take steps or weight shift.  Ambulation/Gait             General Gait Details: patient not able to perform, attempted x3 trails, with seated break in between, patient unable to initiate weight shift or stride   Stairs            Wheelchair Mobility    Modified Rankin (Stroke Patients Only)       Balance   Sitting-balance support: Feet supported Sitting balance-Leahy Scale: Fair Sitting balance - Comments: patient able to sit EOB unsupported     Standing balance-Leahy Scale: Poor                      Cognition Arousal/Alertness: Awake/alert Behavior During Therapy: WFL for tasks assessed/performed Overall Cognitive Status: Within Functional Limits for tasks assessed                      Exercises      General Comments        Pertinent Vitals/Pain Pain Assessment: Faces Faces Pain Scale: Hurts even more Pain Descriptors / Indicators: Grimacing Pain Intervention(s): Monitored during session    Home Living                      Prior Function            PT Goals (current goals can now be found in the care plan section) Acute Rehab PT Goals Patient Stated Goal: pt really wants to go home PT Goal Formulation: With patient Time For Goal Achievement: 10/05/14  Potential to Achieve Goals: Fair Progress towards PT goals: Not progressing toward goals - comment    Frequency  Min 3X/week    PT Plan Current plan remains appropriate    Co-evaluation             End of Session Equipment Utilized During Treatment: Gait belt;Oxygen Activity Tolerance: Patient limited by pain;Patient limited by fatigue Patient left: in chair;with call bell/phone within reach;with chair alarm set     Time: [phone] PT Time Calculation (min) (ACUTE ONLY): 20 min  Charges:  $Therapeutic Activity: 8-22 mins                    G CodesFabio Asa 2014-10-09, 1:25 PM Charlotte Crumb, PT DPT  [phone] '

## 2014-10-02 NOTE — Plan of Care (Signed)
Problem: Discharge Progression Outcomes Goal: Able to perform self care activities Outcome: Progressing Need assist with ADL

## 2014-10-02 NOTE — Progress Notes (Signed)
Subjective: [name]. [name] says that her breathing is improved. She asked why I had discontinued her norco and I told her she had not used it in days or complained of leg pain. She said her legs did not hurt at this time and I told her to use tylenol if she had any further leg pain. She said she feels ready to go home with alleged 24 hour supervision per family. She still refuses SNF.   Objective: Vital signs in last 24 hours: Filed Vitals:   10/01/14 1400 10/01/14 2042 10/02/14 0527 10/02/14 0935  BP: 113/67 115/79 108/65   Pulse: 112 79    Temp: 98 F (36.7 C) 98.3 F (36.8 C) 98.1 F (36.7 C)   TempSrc: Oral Oral Oral   Resp: 18  18   Height:      Weight:   165 lb 9.1 oz (75.1 kg)   SpO2: 93% 96% 100% 100%   Weight change: -4 lb 3 oz (-1.9 kg)  Intake/Output Summary (Last 24 hours) at 10/02/14 1101 Last data filed at 10/02/14 0823  Gross per 24 hour  Intake    723 ml  Output    300 ml  Net    423 ml   Gen: A&O x 4, no acute distress, sitting upright in bed on 3L nasal cannula HEENT: [place]/AT, EOMI, moist mucous membranes Heart: Irregularly irregular, no murmurs, rubs, or gallops Lungs: CTAB, respirations unlabored while on 3L nasal cannula sating in the high 90s Abd: Soft, non-tender, non-distended, + bowel sounds Ext: 1+ b/l pitting edema up to knees, evidence of chronic venous stasis b/l, skin is very dry  Lab Results: Basic Metabolic Panel:  Recent Labs Lab 09/30/14 0914 10/02/14 0325  NA 134* 137  K 3.9 3.8  CL 89* 93*  CO2 36* 37*  GLUCOSE 229* 146*  BUN 19 26*  CREATININE 0.77 0.86  CALCIUM 8.2* 8.2*    CBC:  Recent Labs Lab 09/30/14 0914 10/02/14 0325  WBC 2.6* 2.7*  HGB 10.7* 9.4*  HCT 35.5* 31.4*  MCV 89.2 89.7  PLT 239 240   CBG:  Recent Labs Lab 09/30/14 2054 10/01/14 0705 10/01/14 1113 10/01/14 1602 10/01/14 2056 10/02/14 0553  GLUCAP 206* 177* 137* 227* 160* 150*   BNP 550  ABG pH 7.40, pCO2 54, pO2 108, bicarb 33, on  nonrebreather  TTE:  - Left ventricle: The cavity size was normal. There was mild concentric hypertrophy. Systolic function was severely reduced. The estimated ejection fraction was in the range of 20% to 25%. Severe diffuse hypokinesis with no identifiable regional variations. - Ventricular septum: Septal motion showed abnormal function, dyssynergy, and paradox. The contour showed diastolic flattening. These changes are consistent with RV volume overload. - Aortic valve: There was mild regurgitation directed centrally in the LVOT. - Mitral valve: There was mild to moderate regurgitation directed centrally. The acceleration rate of the regurgitant jet was reduced, consistent with a low dP/dt. - Left atrium: The atrium was moderately to severely dilated. - Right ventricle: The cavity size was moderately dilated. Systolic function was moderately to severely reduced. - Right atrium: The atrium was severely dilated. - Atrial septum: A patent foramen ovale cannot be excluded. - Tricuspid valve: There was malcoaptation of the valve leaflets. There was severe regurgitation directed centrally. - Pulmonary arteries: Systolic pressure was moderately increased. PA peak pressure: 54 mm Hg (S). - Pericardium, extracardiac: A small, free-flowing pericardial effusion was identified along the right atrial free wall. There was a  right pleural effusion. There was a left pleural effusion.  CXR 09/26/14  FINDINGS: Portable AP semi upright view at 0715 hours. Stable cardiac and mediastinal contours, with severe cardiomegaly. Visualized tracheal air column is within normal limits. No pneumothorax. Chronic retrocardiac hypoventilation. Interval Stable pulmonary vascularity, no overt edema. Probable small pleural effusions, stable. No areas of worsening ventilation.  IMPRESSION: No overt edema or areas of worsening ventilation. Stable chest with severe cardiomegaly, chronic  retrocardiac hypoventilation, and probable small pleural effusions.  CXR 09/25/14:  FINDINGS: Shallow inspiration. Diffusely enlarged cardiac silhouette with mild vascular congestion. Findings may be due to either a combination of cardiac enlargement and pericardial effusion. Probable small bilateral pleural effusions. Bilateral basilar atelectasis. No pneumothorax. Calcified and tortuous aorta. Appearances are similar prior study.  IMPRESSION: Cardiac enlargement with mild pulmonary vascular congestion. Probable small pleural effusions with basilar atelectasis bilaterally.  Medications: I have reviewed the patient's current medications. Scheduled Meds: . antiseptic oral rinse  7 mL Mouth Rinse BID  . brimonidine  1 drop Left Eye BID  . dorzolamide  1 drop Left Eye BID  . furosemide  120 mg Oral Daily  . insulin aspart  0-9 Units Subcutaneous TID WC  . lisinopril  5 mg Oral Daily  . metolazone  2.5 mg Oral Weekly  . metoprolol succinate  100 mg Oral Daily  . metoprolol succinate  50 mg Oral Daily  . mometasone-formoterol  2 puff Inhalation BID  . potassium chloride  40 mEq Oral BID  . rivaroxaban  20 mg Oral Q supper  . sodium chloride  3 mL Intravenous Q12H  . sodium chloride  3 mL Intravenous Q12H  . spironolactone  25 mg Oral Daily   Continuous Infusions:  PRN Meds:.albuterol, diclofenac sodium, hydrOXYzine   Assessment/Plan:  AoC CHF: Weight is down 23 pounds since admission.  Still edematous but greatly decreased from 3+ with daily improvement. She agrees that her volume overload is greatly improved.  I&Os are inaccurate but recorded net -17.5L.  She is oxygenating back to her hospital baseline as she is in the high 90s on 3L nasal cannula. She says at home she uses home O2 at night and sometimes during the day. Cardiology feels she is ready for discharge with close follow-up. They have made the diuresis recommendations included in plan below. [name] refuses to go to  SNF on discharge and says she will have 24 hour supervision at home from family. -appreciate cardiology recs -lasix 120 mg po bid -metolazone 2.5 mg po every Friday -lisinopril 5 mg daily, spironolactone 25 mg daily -toprol-xl 100 mg am, 50 mg pm -KDur 40 mEq po bid -HF f/u 2/22 @ 10:30 am -keep O2 > 92% -PT/OT -strict I&O -daily weights  Atrial fibrillation: Her RVR resolved on amiodarone drip which was discontinued 09/26/14. She continues to be in atrial fibrillation as she has prior.  HF up-titrated her beta blocker by adding toprol-xl 50 mg qhs to toprol-xl 100 mg daily and rate now max low 100s but mostly 70s. -toprol-xl 100 mg am, 50 mg pm -xarelto 20 mg daily -cardiac monitoring  Asthma: Wheezing resolved on exam. Should be scheduled as she will not ask for neb treatment on her own. - albuterol 2.5 mg neb tid scheduled - continue Dulera 2 puff inh bid and oxygen supplementation  Anemia: stable since admission but FOB from admission is positive. She is on xarelto as noted above but no signs of bleeding.  Anemia panel c/w Fe deficiency. Will hold  on GI consult given goals of care for patient and whether she is a candidate for further work-up. -cont to monitor  Pulmonary HTN: [name]'s PA 54 mm Hg is improved from 61 mmHg in 03/2014.There were pulmonary vascular changes noted on CXR. Cardiology feels she is not a candidate for cardiac catheterization. Goals of care discussion needed.  -appreciate cards  DM2: Last A1c 7.6 on 1/129/16. She takes metformin 1000 mg bid at home.  AM CBG 150 -hold metformin 1000 mg bid -continue SSI-sensitive  HTN: stable. At home she is on lisinopril 5 mg daily, toprol-xl 100 mg daily, and lasix 120 mg am, 80 mg pm. -cont lisinopril 5 mg daily, toprol-xl 100 mg daily with toprol-xl 50 mg qhs, spironolactone 25 mg daily -lasix 120 mg po bid -metolazone 2.5 mg po every Friday  Leukopenia: WBC has ranged from 1.8 on presentation to 2.7 today. This  appears to be stable near her baseline for the past year. Differential distribution on admission 51% neutrophil, 29% lymphocyte, 16% monocyte, 3% eosinophil, 1% basophil.  Dispo: Home today. She adamantly refuses SNF which is recommended.  The patient does have a current PCP Yolanda Manges, DO) and does need an [facility] hospital follow-up appointment after discharge.  The patient does not know have transportation limitations that hinder transportation to clinic appointments.  .Services Needed at time of discharge: Y = Yes, Blank = No PT: Y  OT: Y  RN: Y  Equipment:   Other:     LOS: 11 days   Lorenda Hatchet, MD 10/02/2014, 11:01 AM

## 2014-10-02 NOTE — Progress Notes (Signed)
All d/c instructions explained and  given to pt. & her daughter.  Verbalized understanding.  D/c off floor via w/c  Amanda Pea, RN.

## 2014-10-02 NOTE — Progress Notes (Signed)
Notified pt's daughter Carmelina Dane, that pt is ready for d/c Stated "I"ll be up there in a minute".  Amanda Pea, Charity fundraiser.

## 2014-10-05 ENCOUNTER — Telehealth: Payer: Self-pay | Admitting: *Deleted

## 2014-10-05 ENCOUNTER — Other Ambulatory Visit: Payer: Self-pay | Admitting: *Deleted

## 2014-10-05 NOTE — Telephone Encounter (Signed)
Daughter informed pt was on tylenol in hospital.   She is still asking for stronger meds as tylenol is not working.   Also she again states a medicated cream was used for pain in Hospital. Please refill eye drops

## 2014-10-05 NOTE — Telephone Encounter (Addendum)
Call from [place], RN with [facility] - # [phone]  Nurse states Home health is seeing pt after hospital d/c.  She has a small bed sore that they would like to use DuoDerm on to hear. Also pt is c/o increase pain to left hip, this is not new.   She has used tylenol but wants something a little  stronger.  Pt has f/u in clinic on 2/23.  Family states pt was supposed to get pain meds when she left hospital. Pt # [phone]  Please advise

## 2014-10-05 NOTE — Telephone Encounter (Signed)
Pt's daughter states dr Andrey Campanile had told them she would call the trusopt in, please advise. She also wants something for pain, tylenol does not help and also needs some of the cream used in the hospital for her leg

## 2014-10-06 ENCOUNTER — Telehealth: Payer: Self-pay | Admitting: *Deleted

## 2014-10-06 ENCOUNTER — Other Ambulatory Visit: Payer: Self-pay | Admitting: Oncology

## 2014-10-06 ENCOUNTER — Other Ambulatory Visit: Payer: Self-pay | Admitting: Internal Medicine

## 2014-10-06 ENCOUNTER — Encounter: Payer: Self-pay | Admitting: Oncology

## 2014-10-06 MED ORDER — ACETAMINOPHEN-CODEINE #3 300-30 MG PO TABS: 1.0000 | ORAL_TABLET | Freq: Four times a day (QID) | ORAL | Status: DC | PRN

## 2014-10-06 MED ORDER — DICLOFENAC SODIUM 1 % TD GEL: 2.0000 g | Freq: Four times a day (QID) | TRANSDERMAL | Status: DC | PRN

## 2014-10-06 MED ORDER — DORZOLAMIDE HCL 2 % OP SOLN: 1.0000 [drp] | Freq: Two times a day (BID) | OPHTHALMIC | Status: DC

## 2014-10-06 NOTE — Telephone Encounter (Signed)
Dr. Valentino Saxon has refilled the Trusopt and prescribed Voltaren gel.

## 2014-10-06 NOTE — Progress Notes (Unsigned)
Patient ID: [name], female   DOB: November 20, 1925, 81 y.o.   MRN: [id number] Rx called in to pharmacy - [facility]/[address] [name] RN 10/06/14 5:50PM

## 2014-10-06 NOTE — Telephone Encounter (Signed)
Daughter is very upset with clinic - insurance will not pay for Voltaren gel - she states they have waited all week for this med. She states they need something for pain now and does not want to wait. They use [facility]/[address] [facility] Arlynn Mcdermid RN 10/06/14 5:10PM

## 2014-10-06 NOTE — Progress Notes (Unsigned)
Patient ID: [name], female   DOB: 05-17-1926, 81 y.o.   MRN: [id number] Daughter distraught; mother in pain.  Insurance won't authorize Voltaren gel; pt on Xarelto 20 mg daily - dose just increased in hospital from 15 mg. I will prescribe Tylenol #3 1 PO Q6h prn #60 R2

## 2014-10-07 NOTE — Telephone Encounter (Signed)
Dr. Cyndie Chime has sent in a prescription for Tylenol #3.

## 2014-10-08 ENCOUNTER — Other Ambulatory Visit: Payer: Self-pay | Admitting: Internal Medicine

## 2014-10-09 ENCOUNTER — Ambulatory Visit (HOSPITAL_COMMUNITY)
Admission: RE | Admit: 2014-10-09 | Discharge: 2014-10-09 | Disposition: A | Discharge status: Home / Self Care | Payer: Medicare Other | Source: Ambulatory Visit | Attending: Internal Medicine | Admitting: Internal Medicine

## 2014-10-09 ENCOUNTER — Telehealth: Payer: Self-pay | Admitting: Internal Medicine

## 2014-10-09 VITALS — BP 118/68 | HR 85 | Wt 152.0 lb

## 2014-10-09 DIAGNOSIS — I5042 Chronic combined systolic (congestive) and diastolic (congestive) heart failure: Secondary | ICD-10-CM | POA: Diagnosis present

## 2014-10-09 DIAGNOSIS — M8580 Other specified disorders of bone density and structure, unspecified site: Secondary | ICD-10-CM | POA: Insufficient documentation

## 2014-10-09 DIAGNOSIS — Z79899 Other long term (current) drug therapy: Secondary | ICD-10-CM | POA: Insufficient documentation

## 2014-10-09 DIAGNOSIS — Z9981 Dependence on supplemental oxygen: Secondary | ICD-10-CM | POA: Diagnosis not present

## 2014-10-09 DIAGNOSIS — I10 Essential (primary) hypertension: Secondary | ICD-10-CM | POA: Diagnosis not present

## 2014-10-09 DIAGNOSIS — I482 Chronic atrial fibrillation, unspecified: Secondary | ICD-10-CM

## 2014-10-09 DIAGNOSIS — Z86711 Personal history of pulmonary embolism: Secondary | ICD-10-CM | POA: Diagnosis not present

## 2014-10-09 DIAGNOSIS — J9610 Chronic respiratory failure, unspecified whether with hypoxia or hypercapnia: Secondary | ICD-10-CM | POA: Insufficient documentation

## 2014-10-09 DIAGNOSIS — I2699 Other pulmonary embolism without acute cor pulmonale: Secondary | ICD-10-CM | POA: Diagnosis not present

## 2014-10-09 DIAGNOSIS — M1990 Unspecified osteoarthritis, unspecified site: Secondary | ICD-10-CM | POA: Insufficient documentation

## 2014-10-09 DIAGNOSIS — E780 Pure hypercholesterolemia: Secondary | ICD-10-CM | POA: Diagnosis not present

## 2014-10-09 DIAGNOSIS — Z7901 Long term (current) use of anticoagulants: Secondary | ICD-10-CM | POA: Insufficient documentation

## 2014-10-09 DIAGNOSIS — E119 Type 2 diabetes mellitus without complications: Secondary | ICD-10-CM | POA: Diagnosis not present

## 2014-10-09 LAB — BASIC METABOLIC PANEL
Anion gap: 11 (ref 5–15)
BUN: 20 mg/dL (ref 6–23)
CHLORIDE: 100 mmol/L (ref 96–112)
CO2: 30 mmol/L (ref 19–32)
Calcium: 9.2 mg/dL (ref 8.4–10.5)
Creatinine, Ser: 0.86 mg/dL (ref 0.50–1.10)
GFR calc Af Amer: 69 mL/min — ABNORMAL LOW (ref >90)
GFR calc non Af Amer: 59 mL/min — ABNORMAL LOW (ref >90)
Glucose, Bld: 152 mg/dL — ABNORMAL HIGH (ref 70–99)
Potassium: 3.4 mmol/L — ABNORMAL LOW (ref 3.5–5.1)
Sodium: 141 mmol/L (ref 135–145)

## 2014-10-09 LAB — BRAIN NATRIURETIC PEPTIDE: B Natriuretic Peptide: 948.5 pg/mL — ABNORMAL HIGH (ref 0.0–100.0)

## 2014-10-09 MED ORDER — FUROSEMIDE 80 MG PO TABS: 120.0000 mg | ORAL_TABLET | Freq: Two times a day (BID) | ORAL | Status: DC

## 2014-10-09 NOTE — Progress Notes (Signed)
Patient ID: [name], female   DOB: 06/13/1926, 81 y.o.   MRN: [id number] PCP: Primary Cardiologist: Dr Antoine Poche  HPI: [name] is an 81 year old with a history of HTN, DMT2, Asthma, Churg-Strauss syndrome, Chronic AFIB on xarelto, PE, night time oxygen, and Chronic systolic HF EF 20-25%.   Admitted in early February with hypoxia and volume overload. Diuresed with IV lasix and transitioned to po lasix 120 mg twice a day + metolazone every Friday. Discharge weight was 165 pounds. [facility] to follow.   She returns for post hospital follow up. Complaining of fatigue. Mild dyspnea with exertion. Denies orthopnea/PND.  She is not sure what her weight is at home. Uses oxygen at night. [facility] following for RN/PT. Her daughter helps her with medications. She has hospital bed, walker, and wheelchair at home. Appetite good.   ROS: All systems negative except as listed in HPI, PMH and Problem List.  SH:  History   Social History  . Marital Status: Widowed    Spouse Name: N/A  . Number of Children: N/A  . Years of Education: N/A   Occupational History  . Retired    Social History Main Topics  . Smoking status: Never Smoker   . Smokeless tobacco: Never Used  . Alcohol Use: No  . Drug Use: No  . Sexual Activity: Not on file   Other Topics Concern  . Not on file   Social History Narrative   Lives in house, with family just next door.    Never smoker, no alcohol.      Used to work at [employer].    Before that, worked in food prep.                    FH:  Family History  Problem Relation Age of Onset  . Diabetes Mother   . Diabetes Father   . Heart disease Neg Hx     Past Medical History  Diagnosis Date  . Hypertension   . Asthma   . Osteoarthritis     left shoulder  . Pseudogout   . Hypokalemia     a. Previously felt due to diuretics, req supplementation.  . Churg-Strauss syndrome     a. Sural nerve biopsy 02/2000.  Darene Lamer disease     a. s/p  thyroidectomy.  . Glaucoma   . Hypercholesteremia   . Venous insufficiency   . Osteopenia     DEXA 2004  . Lipoma     left inner thigh  . Cataract     left eye  . VASCULITIS   . Chronic combined systolic and diastolic heart failure     a. 10/2990 EF:20-25%  . Pleural effusion, left     a. Thoracentesis 2001 - per notes, no malignant cells, was transudative.  Marland Kitchen NSVT (nonsustained ventricular tachycardia)     a. During CHF adm 2001.  . Multifocal atrial tachycardia     a. Documented as OP 01/2012.  Marland Kitchen Ectopic atrial tachycardia     a. Documented on tele as IP 02/2012.  . Pulmonary HTN     a. Mod by echo 02/2012.  . Valvular heart disease     a. Echo 02/2012: mod MR/TR, mild AI.  Marland Kitchen Pericardial effusion     a. Echo 02/2012: small-mod pericardial effusion.  . Atrial fibrillation, permanent     a. Dx 04/2013, on metoprolol, dig and xarelto  . Shortness of breath   . CHF (congestive heart failure)   .  On home oxygen therapy     "wear it mostly at night" (09/20/2014)  . Pneumonia 1930's  . Type II diabetes mellitus     Current Outpatient Prescriptions  Medication Sig Dispense Refill  . acetaminophen (TYLENOL) 500 MG tablet Take 1,000 mg by mouth every 6 (six) hours as needed (leg pain).    Marland Kitchen acetaminophen-codeine (TYLENOL #3) 300-30 MG per tablet Take 1 tablet by mouth every 6 (six) hours as needed for moderate pain or severe pain. 60 tablet 2  . albuterol (PROVENTIL HFA;VENTOLIN HFA) 108 (90 BASE) MCG/ACT inhaler Inhale 1-2 puffs into the lungs every 6 (six) hours as needed for shortness of breath. 18 g 11  . brimonidine (ALPHAGAN P) 0.1 % SOLN Place 1 drop into the left eye every 12 (twelve) hours. 15 mL 1  . diclofenac sodium (VOLTAREN) 1 % GEL Apply 2 g topically 4 (four) times daily as needed. 100 g 0  . dorzolamide (TRUSOPT) 2 % ophthalmic solution Place 1 drop into the left eye 2 (two) times daily. 10 mL 0  . DULERA 100-5 MCG/ACT AERO INHALE 2 PUFFS BY MOUTH TWICE DAILY 13 g 0  .  furosemide (LASIX) 80 MG tablet Take 1.5 tablets (120 mg total) by mouth 2 (two) times daily. Take 1.5 tabs in morning, and 1 tab in the evening. (Patient taking differently: Take 120 mg by mouth 2 (two) times daily. ) 180 tablet 3  . lisinopril (PRINIVIL,ZESTRIL) 5 MG tablet TAKE 1 TABLET BY MOUTH EVERY DAY 30 tablet 3  . metFORMIN (GLUCOPHAGE) 1000 MG tablet TAKE 1 TABLET BY MOUTH TWICE DAILY WITH A MEAL 60 tablet 3  . metolazone (ZAROXOLYN) 2.5 MG tablet Take 1 tablet (2.5 mg total) by mouth once a week. On Fridays 30 tablet 0  . metoprolol succinate (TOPROL-XL) 50 MG 24 hr tablet Take 1-2 tablets (50-100 mg total) by mouth 2 (two) times daily. Take 100 mg in the morning, 50 mg in the evening 90 tablet 0  . potassium chloride SA (K-DUR,KLOR-CON) 20 MEQ tablet TAKE 2 TABLETS BY MOUTH DAILY 60 tablet 0  . rivaroxaban (XARELTO) 20 MG TABS tablet Take 1 tablet (20 mg total) by mouth daily with supper. 30 tablet 0  . spironolactone (ALDACTONE) 25 MG tablet Take 1 tablet (25 mg total) by mouth daily. 30 tablet 0   No current facility-administered medications for this encounter.    Filed Vitals:   10/09/14 1122  BP: 118/68  Pulse: 85  Weight: 152 lb (68.947 kg)  SpO2: 92%    PHYSICAL EXAM:  Physical Exam:  General: Elderly Sitting in the chair. NAD HEENT: normal  Neck: supple. JVP 6-7 Carotids 2+ bilat; no bruits. No lymphadenopathy or thryomegaly appreciated.  Cor: PMI nondisplaced. Irregular rate & rhythm. 2/6 MR/TR at apex  Lungs: dimished but clear. On 2 liters nasal cannula.  Abdomen: soft, nontender, nondistended. No hepatosplenomegaly. No bruits or masses. Good bowel sounds.  Extremities: no cyanosis, clubbing, rash, R and LLE tr edema  Neuro: alert & orientedx3, cranial nerves grossly intact. moves all 4 extremities w/o difficulty. Affect pleasant      ASSESSMENT & PLAN: 1. Chronic/ Systolic & Diastolic HF 09/21/2014 EF 20-25% with moderate to severe RV dysfunction.   NYHA IIIb. Weight down more since discharge. Continue lasix 120 mg twice a day with 2.5 mg metolazone on Monday. I have instructed the granddaughter to hold lasix and potassium for weight less than 150 pounds.  Continue 40 meq potassium daily.  Discussed daily  weights, limiting fluid intake to < 2 liters per day, and low salt food choices.  Check BMET and BNP today. Continue home health RN and PT.  --EF 20-25% with moderate to severe RV dysfunction  2.  Chronic A fib Chads Vasc Score 6 . Rate controlled on Toprol XL 100 mg in am and 50 mg in pm. On Xarelto 20 mg daily.  3. H/O PE - on xarelto 20 mg daily  4. Chronic Repsiratory Failure - Uses home oxygen   Follow up in 3 weeks.   Floy Riegler  NP-C   12:11 PM

## 2014-10-09 NOTE — Patient Instructions (Signed)
Continue Furosemide (Lasix) 120 mg Twice daily, if weight is 150 lb or less HOLD  Labs today  Your physician recommends that you schedule a follow-up appointment in: 3 weeks

## 2014-10-09 NOTE — Telephone Encounter (Signed)
Call to patient to confirm appointment for 10/10/14 at 3:45. lmtcb

## 2014-10-10 ENCOUNTER — Encounter: Payer: Self-pay | Admitting: Internal Medicine

## 2014-10-10 ENCOUNTER — Ambulatory Visit (INDEPENDENT_AMBULATORY_CARE_PROVIDER_SITE_OTHER): Payer: Medicare Other | Admitting: Internal Medicine

## 2014-10-10 VITALS — BP 108/50 | HR 57 | Temp 98.1°F | Wt 143.3 lb

## 2014-10-10 DIAGNOSIS — Z7901 Long term (current) use of anticoagulants: Secondary | ICD-10-CM

## 2014-10-10 DIAGNOSIS — E876 Hypokalemia: Secondary | ICD-10-CM

## 2014-10-10 DIAGNOSIS — Z7189 Other specified counseling: Secondary | ICD-10-CM

## 2014-10-10 DIAGNOSIS — D509 Iron deficiency anemia, unspecified: Secondary | ICD-10-CM

## 2014-10-10 DIAGNOSIS — I10 Essential (primary) hypertension: Secondary | ICD-10-CM

## 2014-10-10 DIAGNOSIS — I5043 Acute on chronic combined systolic (congestive) and diastolic (congestive) heart failure: Secondary | ICD-10-CM

## 2014-10-10 MED ORDER — FERROUS SULFATE 325 (65 FE) MG PO TABS: 325.0000 mg | ORAL_TABLET | Freq: Every day | ORAL | Status: DC

## 2014-10-10 MED ORDER — RIVAROXABAN 20 MG PO TABS: 20.0000 mg | ORAL_TABLET | Freq: Every day | ORAL | Status: DC

## 2014-10-10 MED ORDER — BRIMONIDINE TARTRATE 0.1 % OP SOLN: 1.0000 [drp] | Freq: Two times a day (BID) | OPHTHALMIC | Status: DC

## 2014-10-10 NOTE — Assessment & Plan Note (Signed)
Patient counseled on the importance of CODE STATUS and respecting her wishes should she become more ill. Patient remained adamant that she wanted everything to be done possible, remaining full code.

## 2014-10-10 NOTE — Assessment & Plan Note (Signed)
Patient with a potassium of 3.4 from a BMP taken yesterday from cardiology clinic. Likely in the setting of aggressive diuresis. -Extra 20 mEq by mouth potassium tonight -Continue with 40 mEq potassium daily (20 mEq twice a day)

## 2014-10-10 NOTE — Patient Instructions (Addendum)
Please continue doing a good job in keeping your fluids off. I am glad to see that your weight has been doing well. Please continue taking all your medications as prescribed.   I have refilled your eyedrops, Xarelto, and prescribed a new medication for your low blood counts which are iron supplementation pills.  General Instructions:   Thank you for bringing your medicines today. This helps [place] keep you safe from mistakes.   Progress Toward Treatment Goals:  Treatment Goal 09/05/2014  Hemoglobin A1C at goal  Blood pressure at goal    Self Care Goals & Plans:  Self Care Goal 09/20/2014  Manage my medications take my medicines as prescribed; bring my medications to every visit; refill my medications on time  Monitor my health -  Eat healthy foods drink diet soda or water instead of juice or soda; eat more vegetables; eat foods that are low in salt; eat baked foods instead of fried foods; eat fruit for snacks and desserts  Be physically active -  Other -  Meeting treatment goals -    Home Blood Glucose Monitoring 09/05/2014  Check my blood sugar once a day  When to check my blood sugar before breakfast     Care Management & Community Referrals:  Referral 03/16/2013  Referrals made for care management support none needed  Referrals made to community resources none

## 2014-10-10 NOTE — Assessment & Plan Note (Signed)
Patient's weight remains stable at her dry weight since her discharge. Patient seen by heart failure clinic yesterday. Patient states that she is compliant with her regimen of Lasix 120 mg twice a day, lisinopril 5 mg daily, metolazone 2.5 mg weekly, metoprolol 100 mg in the morning and 50 mg in the evening, and spironolactone 25 mg daily. Exam unremarkable for any signs of fluid overload. No significant edema peripherally and lungs clear to auscultation bilaterally. -Continue with current regimen of medications.   Filed Weights   10/10/14 1532  Weight: 143 lb 4.8 oz (65 kg)

## 2014-10-10 NOTE — Progress Notes (Signed)
   Subjective:    Patient ID: [name], female    DOB: [birth year], 81 y.o.   MRN: [id number]  HPI  Patient is an 81 year old with a history of combined congestive heart failure, chronic atrial fibrillation, glaucoma, history of pulmonary embolism, iron deficiency anemia, hyperlipidemia who presents to clinic for a routine follow-up.  Please refer to separate problem-list charting for more details.  Review of Systems  Constitutional: Negative for fever and chills.  HENT: Negative for rhinorrhea and sore throat.   Eyes: Negative for visual disturbance.  Respiratory: Negative for cough and shortness of breath.   Cardiovascular: Negative for chest pain and palpitations.  Gastrointestinal: Negative for nausea, vomiting, abdominal pain, diarrhea, constipation and blood in stool.  Genitourinary: Negative for dysuria and hematuria.  Skin: Positive for rash.  Neurological: Negative for syncope.       Objective:   Physical Exam  Constitutional: She is oriented to person, place, and time. She appears well-developed and well-nourished. No distress.  In a wheelchair, 2 L nasal cannula in place  HENT:  Head: Normocephalic and atraumatic.  Eyes: EOM are normal. Pupils are equal, round, and reactive to light. Left eye exhibits no discharge.  Neck: Normal range of motion. Neck supple. No thyromegaly present.  Cardiovascular: Exam reveals no gallop and no friction rub.   No murmur heard. Irregularly irregular rhythm  Pulmonary/Chest: Effort normal and breath sounds normal. No respiratory distress. She has no wheezes. She has no rales.  Mild rhonchi in the upper airway  Abdominal: Soft. Bowel sounds are normal. She exhibits no distension. There is no tenderness. There is no rebound.  Musculoskeletal: She exhibits edema ( 1+ pitting edema in the left lower extremity, trace pitting edema in the right lower extremity).  Neurological: She is alert and oriented to person, place, and time. No cranial  nerve deficit.  Skin: Skin is warm and dry.  Sacral and coccygeal area with a healed decubitus ulcer  Psychiatric: She has a normal mood and affect. Thought content normal.          Assessment & Plan:  Please refer to separate problem-list charting for more details.

## 2014-10-10 NOTE — Assessment & Plan Note (Signed)
Recent CBCs and iron studies consistent with iron deficiency anemia. Patient not currently on any iron supplementation. Given patient's comorbidities with severe heart failure and age, patient would be a poor candidate for a colonoscopy. In the system, colonoscopy refused in 2012. -Initiate ferrous sulfate 325 mg daily  Iron/TIBC/Ferritin/ %Sat    Component Value Date/Time   IRON 13* 09/22/2014 0417   TIBC 392 09/22/2014 0417   FERRITIN 11 09/22/2014 0417   IRONPCTSAT 3* 09/22/2014 0417   CBC Latest Ref Rng 10/02/2014 09/30/2014 09/29/2014  WBC 4.0 - 10.5 K/uL 2.7(L) 2.6(L) 2.4(L)  Hemoglobin 12.0 - 15.0 g/dL 3.6(U) 10.7(L) 9.9(L)  Hematocrit 36.0 - 46.0 % 31.4(L) 35.5(L) 32.9(L)  Platelets 150 - 400 K/uL 240 239 217

## 2014-10-10 NOTE — Assessment & Plan Note (Signed)
BP Readings from Last 3 Encounters:  10/10/14 108/50  10/02/14 110/70  09/20/14 128/74    Lab Results  Component Value Date   NA 141 10/09/2014   K 3.4* 10/09/2014   CREATININE 0.86 10/09/2014    Assessment: Blood pressure control: controlled Progress toward BP goal:  at goal Comments: Patient states that she is compliant with her Lasix 120 mg twice a day, lisinopril 5 mg daily, metolazone 2.5 mg weekly, metoprolol 100 mg in the morning and 50 mg in the evening, and spironolactone 25 mg daily.  Plan: Medications:  continue current medications Educational resources provided:   Self management tools provided:   Other plans: None

## 2014-10-10 NOTE — Assessment & Plan Note (Signed)
Patient with chronic atrial fibrillation and a history of pulmonary embolism. -Refilled Xarelto 20 mg daily.

## 2014-10-11 ENCOUNTER — Inpatient Hospital Stay (HOSPITAL_COMMUNITY): Payer: Medicare Other

## 2014-10-11 ENCOUNTER — Telehealth: Payer: Self-pay | Admitting: *Deleted

## 2014-10-11 NOTE — Telephone Encounter (Signed)
My understanding is that the Voltaren was never filled because it is not covered by [name]'s insurance.  I will remove it from the med list.

## 2014-10-11 NOTE — Telephone Encounter (Signed)
Call from [facility] with [facility] [phone]  She wanted to make [place] aware of a level 2 drug interaction between Voltaren gel and Xarelto. Please advise.

## 2014-10-11 NOTE — Progress Notes (Signed)
INTERNAL MEDICINE TEACHING ATTENDING ADDENDUM - Alandra Sando, MD: I reviewed and discussed at the time of visit with the resident Dr. Ngo, the patient's medical history, physical examination, diagnosis and results of pertinent tests and treatment and I agree with the patient's care as documented.  

## 2014-10-13 NOTE — Telephone Encounter (Signed)
Chart opened in error

## 2014-10-18 ENCOUNTER — Other Ambulatory Visit: Payer: Self-pay | Admitting: Internal Medicine

## 2014-10-18 DIAGNOSIS — I10 Essential (primary) hypertension: Secondary | ICD-10-CM | POA: Diagnosis not present

## 2014-10-18 DIAGNOSIS — Z5181 Encounter for therapeutic drug level monitoring: Secondary | ICD-10-CM | POA: Diagnosis not present

## 2014-10-18 DIAGNOSIS — I5040 Unspecified combined systolic (congestive) and diastolic (congestive) heart failure: Secondary | ICD-10-CM | POA: Diagnosis not present

## 2014-10-18 DIAGNOSIS — Z7901 Long term (current) use of anticoagulants: Secondary | ICD-10-CM | POA: Diagnosis not present

## 2014-10-18 DIAGNOSIS — E119 Type 2 diabetes mellitus without complications: Secondary | ICD-10-CM | POA: Diagnosis not present

## 2014-10-20 ENCOUNTER — Telehealth: Payer: Self-pay | Admitting: *Deleted

## 2014-10-20 DIAGNOSIS — Z7901 Long term (current) use of anticoagulants: Secondary | ICD-10-CM | POA: Diagnosis not present

## 2014-10-20 DIAGNOSIS — E119 Type 2 diabetes mellitus without complications: Secondary | ICD-10-CM | POA: Diagnosis not present

## 2014-10-20 DIAGNOSIS — Z5181 Encounter for therapeutic drug level monitoring: Secondary | ICD-10-CM | POA: Diagnosis not present

## 2014-10-20 DIAGNOSIS — I10 Essential (primary) hypertension: Secondary | ICD-10-CM | POA: Diagnosis not present

## 2014-10-20 DIAGNOSIS — I5040 Unspecified combined systolic (congestive) and diastolic (congestive) heart failure: Secondary | ICD-10-CM | POA: Diagnosis not present

## 2014-10-20 NOTE — Telephone Encounter (Signed)
[name] with [facility] [phone] - discuss directions on potassium. Dr Loma Newton notes states extra 20 mEq by mouth potassium 10/10/14 and continue with potassium daily twice a day. Stanton Kidney Zannie Runkle RN 10/20/14 3:20PM

## 2014-10-23 DIAGNOSIS — I5040 Unspecified combined systolic (congestive) and diastolic (congestive) heart failure: Secondary | ICD-10-CM | POA: Diagnosis not present

## 2014-10-23 DIAGNOSIS — E119 Type 2 diabetes mellitus without complications: Secondary | ICD-10-CM | POA: Diagnosis not present

## 2014-10-23 DIAGNOSIS — I10 Essential (primary) hypertension: Secondary | ICD-10-CM | POA: Diagnosis not present

## 2014-10-23 DIAGNOSIS — Z7901 Long term (current) use of anticoagulants: Secondary | ICD-10-CM | POA: Diagnosis not present

## 2014-10-23 DIAGNOSIS — Z5181 Encounter for therapeutic drug level monitoring: Secondary | ICD-10-CM | POA: Diagnosis not present

## 2014-10-25 ENCOUNTER — Telehealth: Payer: Self-pay | Admitting: *Deleted

## 2014-10-25 DIAGNOSIS — I509 Heart failure, unspecified: Secondary | ICD-10-CM | POA: Diagnosis not present

## 2014-10-25 DIAGNOSIS — I5040 Unspecified combined systolic (congestive) and diastolic (congestive) heart failure: Secondary | ICD-10-CM | POA: Diagnosis not present

## 2014-10-25 DIAGNOSIS — E119 Type 2 diabetes mellitus without complications: Secondary | ICD-10-CM | POA: Diagnosis not present

## 2014-10-25 DIAGNOSIS — Z5181 Encounter for therapeutic drug level monitoring: Secondary | ICD-10-CM | POA: Diagnosis not present

## 2014-10-25 DIAGNOSIS — I10 Essential (primary) hypertension: Secondary | ICD-10-CM | POA: Diagnosis not present

## 2014-10-25 DIAGNOSIS — Z7901 Long term (current) use of anticoagulants: Secondary | ICD-10-CM | POA: Diagnosis not present

## 2014-10-25 NOTE — Telephone Encounter (Signed)
Call from [place], RN with [facility] [phone]  Nurse reports she noted a movable "Knot" on patients abd area, under navel.  This is new for pt. She feels pt needs to be seen for this.  Pt called to get more information but no answer.  Message left to call clinic.

## 2014-10-25 NOTE — Telephone Encounter (Signed)
Another call from Donita reporting last night pt has some chest pain with SOB lasting a minute or two. Her ankles are swollen a little more than usual, 1 plus edema. Weight is 138.  She is instructed to take lasix when weight goes over 150 pounds.  I did talk with pt's daughter, she noted the knot on abd about a week ago.  It is not bothering the patient. She states pt is doing well today. I scheduled and appointment with Dr Andrey Campanile for next Wednesday but instructed daughter to call back if she has any more chest pain, SOB or other changes.  She agrees.

## 2014-10-27 ENCOUNTER — Telehealth: Payer: Self-pay | Admitting: *Deleted

## 2014-10-27 DIAGNOSIS — I5040 Unspecified combined systolic (congestive) and diastolic (congestive) heart failure: Secondary | ICD-10-CM | POA: Diagnosis not present

## 2014-10-27 DIAGNOSIS — Z5181 Encounter for therapeutic drug level monitoring: Secondary | ICD-10-CM | POA: Diagnosis not present

## 2014-10-27 DIAGNOSIS — I10 Essential (primary) hypertension: Secondary | ICD-10-CM | POA: Diagnosis not present

## 2014-10-27 DIAGNOSIS — Z7901 Long term (current) use of anticoagulants: Secondary | ICD-10-CM | POA: Diagnosis not present

## 2014-10-27 DIAGNOSIS — E119 Type 2 diabetes mellitus without complications: Secondary | ICD-10-CM | POA: Diagnosis not present

## 2014-10-27 NOTE — Telephone Encounter (Signed)
Donita with [facility] called [phone] about  pts BP today 167/95 AM before med and  154 84 PM, Thurs 168/98 in AM and 158/94 in PM and Tues 164/90 in AM. States no c/o fro pt including h/a. Talked with family if any change - pt needs to go to ER. Temp today 99.0 - states family aware and monitor. Stanton Kidney Kyona Chauncey RN 10/27/14 3:15PM

## 2014-10-29 ENCOUNTER — Other Ambulatory Visit: Payer: Self-pay | Admitting: Internal Medicine

## 2014-10-30 ENCOUNTER — Encounter (HOSPITAL_COMMUNITY): Payer: Medicare Other

## 2014-10-30 ENCOUNTER — Other Ambulatory Visit: Payer: Self-pay | Admitting: Internal Medicine

## 2014-10-30 NOTE — Telephone Encounter (Signed)
Ok.  She is scheduled to see me in clinic in two days (11/01/14).

## 2014-10-31 ENCOUNTER — Other Ambulatory Visit: Payer: Self-pay | Admitting: Internal Medicine

## 2014-10-31 DIAGNOSIS — Z5181 Encounter for therapeutic drug level monitoring: Secondary | ICD-10-CM | POA: Diagnosis not present

## 2014-10-31 DIAGNOSIS — I5040 Unspecified combined systolic (congestive) and diastolic (congestive) heart failure: Secondary | ICD-10-CM | POA: Diagnosis not present

## 2014-10-31 DIAGNOSIS — I10 Essential (primary) hypertension: Secondary | ICD-10-CM | POA: Diagnosis not present

## 2014-10-31 DIAGNOSIS — Z7901 Long term (current) use of anticoagulants: Secondary | ICD-10-CM | POA: Diagnosis not present

## 2014-10-31 DIAGNOSIS — E119 Type 2 diabetes mellitus without complications: Secondary | ICD-10-CM | POA: Diagnosis not present

## 2014-11-01 ENCOUNTER — Telehealth: Payer: Self-pay | Admitting: *Deleted

## 2014-11-01 ENCOUNTER — Encounter: Payer: Self-pay | Admitting: Internal Medicine

## 2014-11-01 ENCOUNTER — Ambulatory Visit (INDEPENDENT_AMBULATORY_CARE_PROVIDER_SITE_OTHER): Payer: Medicare Other | Admitting: Internal Medicine

## 2014-11-01 VITALS — BP 145/75 | HR 73 | Temp 98.3°F | Ht 67.0 in | Wt 143.3 lb

## 2014-11-01 DIAGNOSIS — I5042 Chronic combined systolic (congestive) and diastolic (congestive) heart failure: Secondary | ICD-10-CM

## 2014-11-01 DIAGNOSIS — Z7901 Long term (current) use of anticoagulants: Secondary | ICD-10-CM | POA: Diagnosis not present

## 2014-11-01 DIAGNOSIS — I10 Essential (primary) hypertension: Secondary | ICD-10-CM | POA: Diagnosis not present

## 2014-11-01 DIAGNOSIS — K439 Ventral hernia without obstruction or gangrene: Secondary | ICD-10-CM | POA: Diagnosis not present

## 2014-11-01 DIAGNOSIS — Z5181 Encounter for therapeutic drug level monitoring: Secondary | ICD-10-CM | POA: Diagnosis not present

## 2014-11-01 DIAGNOSIS — I5040 Unspecified combined systolic (congestive) and diastolic (congestive) heart failure: Secondary | ICD-10-CM | POA: Diagnosis not present

## 2014-11-01 DIAGNOSIS — E08319 Diabetes mellitus due to underlying condition with unspecified diabetic retinopathy without macular edema: Secondary | ICD-10-CM

## 2014-11-01 DIAGNOSIS — E119 Type 2 diabetes mellitus without complications: Secondary | ICD-10-CM | POA: Diagnosis not present

## 2014-11-01 DIAGNOSIS — R110 Nausea: Secondary | ICD-10-CM

## 2014-11-01 LAB — CBC
HCT: 37.9 % (ref 36.0–46.0)
Hemoglobin: 11.7 g/dL — ABNORMAL LOW (ref 12.0–15.0)
MCH: 27.5 pg (ref 26.0–34.0)
MCHC: 30.9 g/dL (ref 30.0–36.0)
MCV: 89 fL (ref 78.0–100.0)
MPV: 9.1 fL (ref 8.6–12.4)
Platelets: 221 10*3/uL (ref 150–400)
RBC: 4.26 MIL/uL (ref 3.87–5.11)
RDW: 18.6 % — ABNORMAL HIGH (ref 11.5–15.5)
WBC: 3.8 10*3/uL — AB (ref 4.0–10.5)

## 2014-11-01 LAB — COMPLETE METABOLIC PANEL WITH GFR
AST: 11 U/L (ref 0–37)
Albumin: 4 g/dL (ref 3.5–5.2)
Alkaline Phosphatase: 108 U/L (ref 39–117)
BUN: 6 mg/dL (ref 6–23)
CHLORIDE: 101 meq/L (ref 96–112)
CO2: 28 mEq/L (ref 19–32)
Calcium: 8.8 mg/dL (ref 8.4–10.5)
Creat: 0.66 mg/dL (ref 0.50–1.10)
GFR, Est Non African American: 80 mL/min
Glucose, Bld: 92 mg/dL (ref 70–99)
Potassium: 3.8 mEq/L (ref 3.5–5.3)
Sodium: 138 mEq/L (ref 135–145)
TOTAL PROTEIN: 7 g/dL (ref 6.0–8.3)
Total Bilirubin: 1.3 mg/dL — ABNORMAL HIGH (ref 0.2–1.2)

## 2014-11-01 LAB — GLUCOSE, CAPILLARY: Glucose-Capillary: 100 mg/dL — ABNORMAL HIGH (ref 70–99)

## 2014-11-01 LAB — TSH: TSH: 1.128 u[IU]/mL (ref 0.350–4.500)

## 2014-11-01 MED ORDER — RANITIDINE HCL 150 MG PO TABS: 75.0000 mg | ORAL_TABLET | Freq: Two times a day (BID) | ORAL | Status: DC

## 2014-11-01 MED ORDER — RANITIDINE HCL 75 MG PO TABS: 75.0000 mg | ORAL_TABLET | Freq: Two times a day (BID) | ORAL | Status: DC

## 2014-11-01 NOTE — Progress Notes (Signed)
   Subjective:    Patient ID: [name], female    DOB: [birth year], 81 y.o.   MRN: [id number]  HPI Comments: [name] is an 81 year old woman with a PMH of atrial fibrillation and PE (on Xarelto), HTN, systolic and diastolic HF (EF 16-96% Feb 2016), DM type 2, asthma and glaucoma.  Not feeling well for the past three days.  Feels nauseous after eating.  No dysphagia or odynophagia, no vomiting, no heartburn.  No chills/fever or diarrhea.  No belly pain, diarrhea or constipation.  No sick contacts.   No new foods.  Family is concerned that her symptoms may be related to increased lisinopril dose.  Her SBPs were running in the 150s-160s and RN reported to our office.  I planned to see me [place] in the next few days so no new orders were given.  The [facility] subsequently called the on-call doctor for [facility] and was reportedly instructed to increase [name]'s lisinopril from 5mg  to 10mg  daily.  Family feels above symptoms co-incided with the increase dose.      Review of Systems  Constitutional: Negative for fever, chills, appetite change and unexpected weight change.  HENT: Negative for hearing loss.   Eyes: Negative for visual disturbance.  Respiratory: Negative for cough, shortness of breath and wheezing.   Cardiovascular: Negative for chest pain, palpitations and leg swelling.  Gastrointestinal: Negative for nausea, vomiting, abdominal pain, diarrhea, constipation and blood in stool.  Genitourinary: Negative for dysuria, frequency and hematuria.  Neurological: Negative for syncope, weakness and light-headedness.  Psychiatric/Behavioral: Negative for dysphoric mood.       Filed Vitals:   11/01/14 1444  BP: 145/75  Pulse: 73  Temp: 98.3 F (36.8 C)  TempSrc: Oral  Height: 5\' 7"  (1.702 m)  Weight: 143 lb 4.8 oz (65 kg)  SpO2: 99%   Objective:   Physical Exam  Constitutional: She is oriented to person, place, and time. She appears well-developed. No distress.  HENT:  Head:  Normocephalic and atraumatic.  Mouth/Throat: Oropharynx is clear and moist. No oropharyngeal exudate.  Eyes: EOM are normal. Pupils are equal, round, and reactive to light.  Neck: Neck supple.  Cardiovascular: Normal rate and regular rhythm.  Exam reveals no gallop and no friction rub.   No murmur heard. Irregularly irregular  Pulmonary/Chest: Effort normal and breath sounds normal. No respiratory distress. She has no wheezes. She has no rales.  Abdominal: Soft. Bowel sounds are normal. She exhibits mass. She exhibits no distension. There is no tenderness. There is no rebound and no guarding.  Epigastric hernia - non-tender, mobile, reducible.  Musculoskeletal: Normal range of motion. She exhibits no edema or tenderness.  Neurological: She is alert and oriented to person, place, and time. No cranial nerve deficit.  Skin: Skin is warm. She is not diaphoretic.  Psychiatric: She has a normal mood and affect. Her behavior is normal. Thought content normal.  Vitals reviewed.         Assessment & Plan:  Please see problem based assessment and plan.

## 2014-11-01 NOTE — Telephone Encounter (Signed)
Dr Andrey Campanile is seeing pt in clinic this PM - trying to find who told family to have pt take Lisinopril 10mg  daily. Donita states Dr Jacky Kindle is over the high risk pt for [facility]. Donita talked with Dr Jacky Kindle 10/27/14 and instructed pt to take Lisinopril 10 mg daily. Donita was sick  10/30/14 and 10/31/14 and unable to call clinic. Dr Andrey Campanile is aware. Stanton Kidney Gerrod Maule RN 11/01/14 3:30PM

## 2014-11-01 NOTE — Patient Instructions (Signed)
1. I will call you if there are lab abnormalities.  Please take Zantac twice daily and see if this helps your symptoms.  Go to ED with severe stomach pain or vomiting.    2. Please take all medications as prescribed.    3. If you have worsening of your symptoms or new symptoms arise, please call the clinic ([phone]), or go to the ER immediately if symptoms are severe.

## 2014-11-02 ENCOUNTER — Other Ambulatory Visit: Payer: Self-pay | Admitting: Internal Medicine

## 2014-11-02 ENCOUNTER — Telehealth: Payer: Self-pay | Admitting: *Deleted

## 2014-11-02 DIAGNOSIS — K439 Ventral hernia without obstruction or gangrene: Secondary | ICD-10-CM | POA: Insufficient documentation

## 2014-11-02 DIAGNOSIS — R110 Nausea: Secondary | ICD-10-CM | POA: Insufficient documentation

## 2014-11-02 NOTE — Assessment & Plan Note (Signed)
Lab Results  Component Value Date   HGBA1C 7.6 09/05/2014   HGBA1C 7.9* 04/05/2014   HGBA1C 6.7 11/24/2013     Assessment: Diabetes control:  controlled Progress toward A1C goal:   at goal (< 8 given age and co-morbidities) Comments: I do not think the nausea is due to metformin.  She has tolerated this medication for quite some time and there has not been a recent dose change.   Plan: Medications:  continue current medications: metformin 1000mg  BID Other plans: check A1c next month

## 2014-11-02 NOTE — Assessment & Plan Note (Addendum)
BP Readings from Last 3 Encounters:  11/01/14 145/75  10/10/14 108/50  10/02/14 110/70    Lab Results  Component Value Date   NA 138 11/01/2014   K 3.8 11/01/2014   CREATININE 0.66 11/01/2014    Assessment: Blood pressure control:  controlled Progress toward BP goal:   at goal for age Comments: compliant with medications.  Recently elevated 150s/160s per [facility] report.  [facility] on-call doctor gave order to increase dose of lisinopril from 5mg  to 10mg .  BP better controlled today.  Plan: Medications:  continue current medications:  Metoprolol 100mg  in AM and 50mg  in PM, spironolactone 25mg  daily, metolazone 2.5mg  qweekly, Lasix 120mg  BID prn (only give for wt >150 per cardiology order), lisinopril 10mg  (will continued the higher dose for better BP control). Educational resources provided: brochure (denies) Self management tools provided:   Other plans: BMP - renal function stable, K 3.8 (continue as above).

## 2014-11-02 NOTE — Assessment & Plan Note (Signed)
Unclear etiology, but no alarm features.  Possibly related to hernia vs GERD though she denies heartburn.   I doubt it is related to lisinopril dose increase (5mg  to 10mg  is small increase and both are low dose; nausea is not common ADR for this med). - will see if she improves with trial of Zantac; I do not want to prescribe anti-emetics like Phenergan or Zofran given her co-morbid cardiac diagnoses - CMP, CBC, TSH today - re-evaluate prn or symptoms fail to improve in the next few days

## 2014-11-02 NOTE — Telephone Encounter (Signed)
Needs PA on voltaren gel 

## 2014-11-02 NOTE — Assessment & Plan Note (Addendum)
Small reducible hernia located just above umbilicus (epigastric vs umbilical).  The patient has no pain and no signs/symptoms of obstruction.  No need for intervention given asymptomatic and no sign of obstruction.  Her nausea may be related to the hernia vs known GERD (though she denies heartburn symptoms).   - monitor for signs/symptoms of strangulation/obstruction

## 2014-11-02 NOTE — Assessment & Plan Note (Addendum)
Stable.  Wt 143.  No leg edema, no dyspnea on room air, lungs clear.  Cardiology has instructed patient to only take Lasix 120mg  BID if weight > 150.  Also, to hold K on days she does not take Lasix. - continue metoprolol 100mg  in AM and 50mg  in PM, spironolactone 25mg  daily, metolazone 2.5mg  qweekly, Lasix 120mg  BID prn (only give for wt >150 per cardiology order), lisinopril 10mg  daily, supplemental oxygen

## 2014-11-02 NOTE — Telephone Encounter (Signed)
Saw Dr Andrey Campanile 11/01/14.

## 2014-11-03 ENCOUNTER — Telehealth: Payer: Self-pay | Admitting: *Deleted

## 2014-11-03 ENCOUNTER — Telehealth: Payer: Self-pay | Admitting: Internal Medicine

## 2014-11-03 ENCOUNTER — Other Ambulatory Visit: Payer: Self-pay | Admitting: Internal Medicine

## 2014-11-03 DIAGNOSIS — I5040 Unspecified combined systolic (congestive) and diastolic (congestive) heart failure: Secondary | ICD-10-CM | POA: Diagnosis not present

## 2014-11-03 DIAGNOSIS — Z5181 Encounter for therapeutic drug level monitoring: Secondary | ICD-10-CM | POA: Diagnosis not present

## 2014-11-03 DIAGNOSIS — I10 Essential (primary) hypertension: Secondary | ICD-10-CM | POA: Diagnosis not present

## 2014-11-03 DIAGNOSIS — E119 Type 2 diabetes mellitus without complications: Secondary | ICD-10-CM | POA: Diagnosis not present

## 2014-11-03 DIAGNOSIS — Z7901 Long term (current) use of anticoagulants: Secondary | ICD-10-CM | POA: Diagnosis not present

## 2014-11-03 MED ORDER — LISINOPRIL 10 MG PO TABS
10.0000 mg | ORAL_TABLET | Freq: Every day | ORAL | Status: DC
Start: 2014-11-03 — End: 2014-12-08

## 2014-11-03 NOTE — Telephone Encounter (Signed)
I spoke with Ms. Runkles's daughter, [name] regarding my visit with her mom on 03/16.  I have asked her to continue higher dose lisinopril (10mg ) daily for BP since Ms. Ascheman is no longer taking Lasix every day (now prn for weight > 150) and BPs have been higher without it.  I also reminded her to hold potassium supplement on days Ms. Archila is not taking Lasix.  She voiced understanding.

## 2014-11-03 NOTE — Progress Notes (Signed)
Internal Medicine Clinic Attending  I saw and evaluated the patient.  I personally confirmed the key portions of the history and exam documented by Dr. Wilson and I reviewed pertinent patient test results.  The assessment, diagnosis, and plan were formulated together and I agree with the documentation in the resident's note. 

## 2014-11-03 NOTE — Telephone Encounter (Signed)
Message left on ID recording to continue with Lisinopril 10mg  daily and new Rx was sent to pharmacy today per Dr Andrey Campanile. Stanton Kidney Ordell Prichett RN 11/03/14 3:45PM

## 2014-11-06 ENCOUNTER — Telehealth: Payer: Self-pay | Admitting: Family Medicine

## 2014-11-06 ENCOUNTER — Telehealth: Payer: Self-pay | Admitting: *Deleted

## 2014-11-06 DIAGNOSIS — I10 Essential (primary) hypertension: Secondary | ICD-10-CM | POA: Diagnosis not present

## 2014-11-06 DIAGNOSIS — Z7901 Long term (current) use of anticoagulants: Secondary | ICD-10-CM | POA: Diagnosis not present

## 2014-11-06 DIAGNOSIS — I5040 Unspecified combined systolic (congestive) and diastolic (congestive) heart failure: Secondary | ICD-10-CM | POA: Diagnosis not present

## 2014-11-06 DIAGNOSIS — Z5181 Encounter for therapeutic drug level monitoring: Secondary | ICD-10-CM | POA: Diagnosis not present

## 2014-11-06 DIAGNOSIS — E119 Type 2 diabetes mellitus without complications: Secondary | ICD-10-CM | POA: Diagnosis not present

## 2014-11-06 NOTE — Telephone Encounter (Signed)
Received faxed  PA request from pt's pharmacy for her Voltaren Gel 1%.  Request submitted online via Cover My Meds, request sent for review.Kingsley Spittle Cassady3/21/20169:51 AM         [facility] [phone] Pt Id# [id number]

## 2014-11-06 NOTE — Telephone Encounter (Signed)
Home health RN called to notify us of a fall this am. Reportedly patient slipped off bed while attempting to transfer to bedside commode. Family helped her back in bed right after and she sustained no injuries. RN saw this afternoon and reports no injuries on her exam and patient doing well without pain. Educated patient on importance of asking for help with transfers to prevent future falls.   I did not realize until after the call had ended that this was an internal medicine patient. I will attempt to route to an appropriate provide but there does not seem to be anything that needs to be done at this time.

## 2014-11-09 DIAGNOSIS — E119 Type 2 diabetes mellitus without complications: Secondary | ICD-10-CM | POA: Diagnosis not present

## 2014-11-09 DIAGNOSIS — I5040 Unspecified combined systolic (congestive) and diastolic (congestive) heart failure: Secondary | ICD-10-CM | POA: Diagnosis not present

## 2014-11-09 DIAGNOSIS — I10 Essential (primary) hypertension: Secondary | ICD-10-CM | POA: Diagnosis not present

## 2014-11-09 DIAGNOSIS — Z5181 Encounter for therapeutic drug level monitoring: Secondary | ICD-10-CM | POA: Diagnosis not present

## 2014-11-09 DIAGNOSIS — Z7901 Long term (current) use of anticoagulants: Secondary | ICD-10-CM | POA: Diagnosis not present

## 2014-11-10 ENCOUNTER — Other Ambulatory Visit: Payer: Self-pay | Admitting: Internal Medicine

## 2014-11-10 DIAGNOSIS — H409 Unspecified glaucoma: Secondary | ICD-10-CM

## 2014-11-14 ENCOUNTER — Inpatient Hospital Stay (HOSPITAL_COMMUNITY): Admission: RE | Admit: 2014-11-14 | Payer: Medicare Other | Source: Ambulatory Visit

## 2014-11-15 DIAGNOSIS — Z5181 Encounter for therapeutic drug level monitoring: Secondary | ICD-10-CM | POA: Diagnosis not present

## 2014-11-15 DIAGNOSIS — I10 Essential (primary) hypertension: Secondary | ICD-10-CM | POA: Diagnosis not present

## 2014-11-15 DIAGNOSIS — I5040 Unspecified combined systolic (congestive) and diastolic (congestive) heart failure: Secondary | ICD-10-CM | POA: Diagnosis not present

## 2014-11-15 DIAGNOSIS — E119 Type 2 diabetes mellitus without complications: Secondary | ICD-10-CM | POA: Diagnosis not present

## 2014-11-15 DIAGNOSIS — Z7901 Long term (current) use of anticoagulants: Secondary | ICD-10-CM | POA: Diagnosis not present

## 2014-11-17 ENCOUNTER — Other Ambulatory Visit: Payer: Self-pay | Admitting: Internal Medicine

## 2014-11-18 DIAGNOSIS — E119 Type 2 diabetes mellitus without complications: Secondary | ICD-10-CM | POA: Diagnosis not present

## 2014-11-18 DIAGNOSIS — I10 Essential (primary) hypertension: Secondary | ICD-10-CM | POA: Diagnosis not present

## 2014-11-18 DIAGNOSIS — I5040 Unspecified combined systolic (congestive) and diastolic (congestive) heart failure: Secondary | ICD-10-CM | POA: Diagnosis not present

## 2014-11-18 DIAGNOSIS — Z7901 Long term (current) use of anticoagulants: Secondary | ICD-10-CM | POA: Diagnosis not present

## 2014-11-18 DIAGNOSIS — Z5181 Encounter for therapeutic drug level monitoring: Secondary | ICD-10-CM | POA: Diagnosis not present

## 2014-11-20 DIAGNOSIS — I10 Essential (primary) hypertension: Secondary | ICD-10-CM | POA: Diagnosis not present

## 2014-11-20 DIAGNOSIS — E119 Type 2 diabetes mellitus without complications: Secondary | ICD-10-CM | POA: Diagnosis not present

## 2014-11-20 DIAGNOSIS — Z7901 Long term (current) use of anticoagulants: Secondary | ICD-10-CM | POA: Diagnosis not present

## 2014-11-20 DIAGNOSIS — I5040 Unspecified combined systolic (congestive) and diastolic (congestive) heart failure: Secondary | ICD-10-CM | POA: Diagnosis not present

## 2014-11-20 DIAGNOSIS — Z5181 Encounter for therapeutic drug level monitoring: Secondary | ICD-10-CM | POA: Diagnosis not present

## 2014-11-22 DIAGNOSIS — E119 Type 2 diabetes mellitus without complications: Secondary | ICD-10-CM | POA: Diagnosis not present

## 2014-11-22 DIAGNOSIS — Z7901 Long term (current) use of anticoagulants: Secondary | ICD-10-CM | POA: Diagnosis not present

## 2014-11-22 DIAGNOSIS — I10 Essential (primary) hypertension: Secondary | ICD-10-CM | POA: Diagnosis not present

## 2014-11-22 DIAGNOSIS — Z5181 Encounter for therapeutic drug level monitoring: Secondary | ICD-10-CM | POA: Diagnosis not present

## 2014-11-22 DIAGNOSIS — I5040 Unspecified combined systolic (congestive) and diastolic (congestive) heart failure: Secondary | ICD-10-CM | POA: Diagnosis not present

## 2014-11-24 ENCOUNTER — Telehealth: Payer: Self-pay | Admitting: *Deleted

## 2014-11-24 ENCOUNTER — Emergency Department (HOSPITAL_COMMUNITY): Payer: Medicare Other

## 2014-11-24 ENCOUNTER — Encounter (HOSPITAL_COMMUNITY): Payer: Self-pay | Admitting: *Deleted

## 2014-11-24 ENCOUNTER — Emergency Department (HOSPITAL_COMMUNITY)
Admission: EM | Admit: 2014-11-24 | Discharge: 2014-11-24 | Disposition: A | Discharge status: Home / Self Care | Payer: Medicare Other | Attending: Emergency Medicine | Admitting: Emergency Medicine

## 2014-11-24 DIAGNOSIS — Z9981 Dependence on supplemental oxygen: Secondary | ICD-10-CM | POA: Insufficient documentation

## 2014-11-24 DIAGNOSIS — Z791 Long term (current) use of non-steroidal anti-inflammatories (NSAID): Secondary | ICD-10-CM | POA: Insufficient documentation

## 2014-11-24 DIAGNOSIS — Z7901 Long term (current) use of anticoagulants: Secondary | ICD-10-CM | POA: Insufficient documentation

## 2014-11-24 DIAGNOSIS — Z8669 Personal history of other diseases of the nervous system and sense organs: Secondary | ICD-10-CM | POA: Insufficient documentation

## 2014-11-24 DIAGNOSIS — I10 Essential (primary) hypertension: Secondary | ICD-10-CM | POA: Diagnosis not present

## 2014-11-24 DIAGNOSIS — R042 Hemoptysis: Secondary | ICD-10-CM | POA: Insufficient documentation

## 2014-11-24 DIAGNOSIS — Z86018 Personal history of other benign neoplasm: Secondary | ICD-10-CM | POA: Diagnosis not present

## 2014-11-24 DIAGNOSIS — I5042 Chronic combined systolic (congestive) and diastolic (congestive) heart failure: Secondary | ICD-10-CM | POA: Insufficient documentation

## 2014-11-24 DIAGNOSIS — M1990 Unspecified osteoarthritis, unspecified site: Secondary | ICD-10-CM | POA: Diagnosis not present

## 2014-11-24 DIAGNOSIS — R0602 Shortness of breath: Secondary | ICD-10-CM | POA: Diagnosis not present

## 2014-11-24 DIAGNOSIS — Z5181 Encounter for therapeutic drug level monitoring: Secondary | ICD-10-CM | POA: Diagnosis not present

## 2014-11-24 DIAGNOSIS — J45909 Unspecified asthma, uncomplicated: Secondary | ICD-10-CM | POA: Insufficient documentation

## 2014-11-24 DIAGNOSIS — R05 Cough: Secondary | ICD-10-CM | POA: Diagnosis not present

## 2014-11-24 DIAGNOSIS — Z8701 Personal history of pneumonia (recurrent): Secondary | ICD-10-CM | POA: Insufficient documentation

## 2014-11-24 DIAGNOSIS — R079 Chest pain, unspecified: Secondary | ICD-10-CM | POA: Diagnosis not present

## 2014-11-24 DIAGNOSIS — Z79899 Other long term (current) drug therapy: Secondary | ICD-10-CM | POA: Insufficient documentation

## 2014-11-24 DIAGNOSIS — E119 Type 2 diabetes mellitus without complications: Secondary | ICD-10-CM | POA: Insufficient documentation

## 2014-11-24 DIAGNOSIS — I5040 Unspecified combined systolic (congestive) and diastolic (congestive) heart failure: Secondary | ICD-10-CM | POA: Diagnosis not present

## 2014-11-24 LAB — CBC WITH DIFFERENTIAL/PLATELET
BASOS ABS: 0 10*3/uL (ref 0.0–0.1)
Basophils Relative: 1 % (ref 0–1)
Eosinophils Absolute: 0.1 10*3/uL (ref 0.0–0.7)
Eosinophils Relative: 3 % (ref 0–5)
HCT: 36.6 % (ref 36.0–46.0)
Hemoglobin: 11.5 g/dL — ABNORMAL LOW (ref 12.0–15.0)
Lymphocytes Relative: 39 % (ref 12–46)
Lymphs Abs: 1.3 10*3/uL (ref 0.7–4.0)
MCH: 28.9 pg (ref 26.0–34.0)
MCHC: 31.4 g/dL (ref 30.0–36.0)
MCV: 92 fL (ref 78.0–100.0)
Monocytes Absolute: 0.3 10*3/uL (ref 0.1–1.0)
Monocytes Relative: 10 % (ref 3–12)
NEUTROS ABS: 1.6 10*3/uL — AB (ref 1.7–7.7)
Neutrophils Relative %: 47 % (ref 43–77)
Platelets: 259 10*3/uL (ref 150–400)
RBC: 3.98 MIL/uL (ref 3.87–5.11)
RDW: 18 % — ABNORMAL HIGH (ref 11.5–15.5)
WBC: 3.4 10*3/uL — ABNORMAL LOW (ref 4.0–10.5)

## 2014-11-24 LAB — BRAIN NATRIURETIC PEPTIDE: B Natriuretic Peptide: 993 pg/mL — ABNORMAL HIGH (ref 0.0–100.0)

## 2014-11-24 LAB — BASIC METABOLIC PANEL
Anion gap: 9 (ref 5–15)
BUN: 10 mg/dL (ref 6–23)
CALCIUM: 9.1 mg/dL (ref 8.4–10.5)
CO2: 29 mmol/L (ref 19–32)
CREATININE: 0.73 mg/dL (ref 0.50–1.10)
Chloride: 100 mmol/L (ref 96–112)
GFR calc Af Amer: 87 mL/min — ABNORMAL LOW (ref >90)
GFR calc non Af Amer: 75 mL/min — ABNORMAL LOW (ref >90)
Glucose, Bld: 136 mg/dL — ABNORMAL HIGH (ref 70–99)
Potassium: 3.8 mmol/L (ref 3.5–5.1)
SODIUM: 138 mmol/L (ref 135–145)

## 2014-11-24 LAB — TROPONIN I: Troponin I: <0.03 ng/mL (ref <0.031)

## 2014-11-24 NOTE — ED Notes (Signed)
The pt has been spitting up blood since this am.  Hx of the same   She was also having chest pain this am.  No pain at present

## 2014-11-24 NOTE — ED Provider Notes (Addendum)
CSN: [id number]     Arrival date & time 11/24/14  1654 History   First MD Initiated Contact with Patient 11/24/14 1808     Chief Complaint  Patient presents with  . Hemoptysis     (Consider location/radiation/quality/duration/timing/severity/associated sxs/prior Treatment) HPI Comments: 81 year old female with malnutrition, Grace, diabetes, cholesterol, high blood pressure, cardiomyopathy, atrial fibrillation, Xarelto use, pulmonary embolism presents with hemoptysis proximal 4 episodes today. Patient has had that once in the past unsure the diagnosis. Patient had brief episode chest pain. Currently no symptoms no fevers or chills no chest pain or shortness of breath. No history of lung cancer known. Nothing specifically caused.  The history is provided by the patient and medical records.    Past Medical History  Diagnosis Date  . Hypertension   . Asthma   . Osteoarthritis     left shoulder  . Pseudogout   . Hypokalemia     a. Previously felt due to diuretics, req supplementation.  . Churg-Strauss syndrome     a. Sural nerve biopsy 02/2000.  Darene Lamer disease     a. s/p thyroidectomy.  . Glaucoma   . Hypercholesteremia   . Venous insufficiency   . Osteopenia     DEXA 2004  . Lipoma     left inner thigh  . Cataract     left eye  . VASCULITIS   . Chronic combined systolic and diastolic heart failure     a. 04/6294 EF:20-25%  . Pleural effusion, left     a. Thoracentesis 2001 - per notes, no malignant cells, was transudative.  Marland Kitchen NSVT (nonsustained ventricular tachycardia)     a. During CHF adm 2001.  . Multifocal atrial tachycardia     a. Documented as OP 01/2012.  Marland Kitchen Ectopic atrial tachycardia     a. Documented on tele as IP 02/2012.  . Pulmonary HTN     a. Mod by echo 02/2012.  . Valvular heart disease     a. Echo 02/2012: mod MR/TR, mild AI.  Marland Kitchen Pericardial effusion     a. Echo 02/2012: small-mod pericardial effusion.  . Atrial fibrillation, permanent     a. Dx 04/2013, on  metoprolol, dig and xarelto  . Shortness of breath   . CHF (congestive heart failure)   . On home oxygen therapy     "wear it mostly at night" (09/20/2014)  . Pneumonia 1930's  . Type II diabetes mellitus    Past Surgical History  Procedure Laterality Date  . Abdominal hysterectomy    . Salpingoophorectomy    . Thyroidectomy    . Hemiarthroplasty hip Right 10/2010    cemented for hip fracture, femoral neck - by Eulas Post, MD  . Cataract extraction Right   . Fracture surgery    . Sural nerve biopsy  02/2000   Family History  Problem Relation Age of Onset  . Diabetes Mother   . Diabetes Father   . Heart disease Neg Hx    History  Substance Use Topics  . Smoking status: Never Smoker   . Smokeless tobacco: Never Used  . Alcohol Use: No   OB History    No data available     Review of Systems  Constitutional: Negative for fever and chills.  HENT: Negative for congestion.   Eyes: Negative for visual disturbance.  Respiratory: Negative for shortness of breath.   Cardiovascular: Negative for chest pain.  Gastrointestinal: Negative for vomiting and abdominal pain.  Genitourinary: Negative for dysuria and flank  pain.  Musculoskeletal: Negative for back pain, neck pain and neck stiffness.  Skin: Negative for rash.  Neurological: Negative for light-headedness and headaches.      Allergies  Amiodarone  Home Medications   Prior to Admission medications   Medication Sig Start Date End Date Taking? Authorizing Provider  acetaminophen-codeine (TYLENOL #3) 300-30 MG per tablet Take 1 tablet by mouth every 6 (six) hours as needed for moderate pain or severe pain. 10/06/14  Yes Levert Feinstein, MD  albuterol (PROVENTIL HFA;VENTOLIN HFA) 108 (90 BASE) MCG/ACT inhaler Inhale 1-2 puffs into the lungs every 6 (six) hours as needed for shortness of breath. 06/14/13  Yes Doneen Poisson, MD  brimonidine (ALPHAGAN P) 0.1 % SOLN Place 1 drop into the left eye every 12 (twelve)  hours. 10/10/14  Yes Harold Barban, MD  diclofenac sodium (VOLTAREN) 1 % GEL Apply 2 g topically 4 (four) times daily as needed. 10/06/14  Yes Lorenda Hatchet, MD  dorzolamide (TRUSOPT) 2 % ophthalmic solution Place 1 drop into the left eye 2 (two) times daily. 11/13/14  Yes Doneen Poisson, MD  DULERA 100-5 MCG/ACT AERO INHALE 2 PUFFS BY MOUTH TWICE DAILY 11/02/14  Yes Yolanda Manges, DO  ferrous sulfate 325 (65 FE) MG tablet Take 1 tablet (325 mg total) by mouth daily. 10/10/14 10/10/15 Yes Harold Barban, MD  lisinopril (PRINIVIL,ZESTRIL) 10 MG tablet Take 1 tablet (10 mg total) by mouth daily. 11/03/14  Yes Alex Elson Clan, DO  metFORMIN (GLUCOPHAGE) 1000 MG tablet TAKE 1 TABLET BY MOUTH TWICE DAILY WITH A MEAL 09/11/14  Yes Yolanda Manges, DO  metolazone (ZAROXOLYN) 2.5 MG tablet Take 1 tablet (2.5 mg total) by mouth once a week. On Fridays 10/02/14  Yes Lorenda Hatchet, MD  metoprolol succinate (TOPROL-XL) 50 MG 24 hr tablet TAKE 2 TABLETS BY MOUTH EVERY MORNING AND 1 TABLET BY MOUTH EVERY EVENING 10/30/14  Yes Yolanda Manges, DO  ranitidine (ZANTAC) 75 MG tablet Take 1 tablet (75 mg total) by mouth 2 (two) times daily. 11/01/14 11/01/15 Yes Alex Elson Clan, DO  rivaroxaban (XARELTO) 20 MG TABS tablet Take 1 tablet (20 mg total) by mouth daily with supper. Patient taking differently: Take 20 mg by mouth daily.  10/10/14  Yes Harold Barban, MD  spironolactone (ALDACTONE) 25 MG tablet TAKE 1 TABLET BY MOUTH EVERY DAY 11/02/14  Yes Yolanda Manges, DO  acetaminophen (TYLENOL) 500 MG tablet Take 1,000 mg by mouth every 6 (six) hours as needed (leg pain).    Historical Provider, MD  furosemide (LASIX) 80 MG tablet Take 1.5 tablets (120 mg total) by mouth 2 (two) times daily. Hold if weight is 150 lb or less Patient not taking: Reported on 11/24/2014 10/09/14   Amy D Clegg, NP  potassium chloride SA (K-DUR,KLOR-CON) 20 MEQ tablet Take 2 tablets (40 mEq total) by mouth daily. Only take this medication on days you take Lasix. Patient  not taking: Reported on 11/24/2014 11/17/14   Yolanda Manges, DO   BP 166/91 mmHg  Pulse 67  Temp(Src) 98.1 F (36.7 C) (Oral)  Resp 17  Wt 161 lb 5 oz (73.171 kg)  SpO2 98% Physical Exam  Constitutional: She is oriented to person, place, and time. She appears well-developed and well-nourished.  HENT:  Head: Normocephalic and atraumatic.  Eyes: Conjunctivae are normal. Right eye exhibits no discharge. Left eye exhibits no discharge.  Neck: Normal range of motion. Neck supple. No tracheal deviation present.  Cardiovascular: Normal rate.  An irregularly irregular rhythm present.  Pulmonary/Chest: Effort normal and breath sounds normal.  Abdominal: Soft. She exhibits no distension. There is no tenderness. There is no guarding.  Musculoskeletal: She exhibits no edema.  Neurological: She is alert and oriented to person, place, and time.  Skin: Skin is warm. No rash noted.  Psychiatric: She has a normal mood and affect.  Nursing note and vitals reviewed.   ED Course  Procedures (including critical care time) Emergency Ultrasound Study:   Angiocath insertion Performed by: Enid Skeens  Consent: Verbal consent obtained. Risks and benefits: risks, benefits and alternatives were discussed Immediately prior to procedure the correct patient, procedure, equipment, support staff and site/side marked as needed.  Indication: difficult IV access Preparation: Patient was prepped and draped in the usual sterile fashion. Vein Location: right ac vein was visualized during assessment for potential access sites and was found to be patent/ easily compressed with linear ultrasound.  The needle was visualized with real-time ultrasound and guided into the vein. Gauge: 20 g  Image saved and stored.  Normal blood return.  Patient tolerance: Patient tolerated the procedure well with no immediate complications.     Labs Review Labs Reviewed  BASIC METABOLIC PANEL - Abnormal; Notable for the  following:    Glucose, Bld 136 (*)    GFR calc non Af Amer 75 (*)    GFR calc Af Amer 87 (*)    All other components within normal limits  BRAIN NATRIURETIC PEPTIDE - Abnormal; Notable for the following:    B Natriuretic Peptide 993.0 (*)    All other components within normal limits  CBC WITH DIFFERENTIAL/PLATELET - Abnormal; Notable for the following:    WBC 3.4 (*)    Hemoglobin 11.5 (*)    RDW 18.0 (*)    Neutro Abs 1.6 (*)    All other components within normal limits  TROPONIN I    Imaging Review Dg Chest 2 View  11/24/2014   CLINICAL DATA:  Chest pain and shortness of breath since last night with hemoptysis. Chronic cough.  EXAM: CHEST  2 VIEW  COMPARISON:  09/26/2014, 09/25/2014, 09/05/2014 and chest CT 08/18/2013  FINDINGS: Lungs are adequately inflated without focal consolidation or effusion. Moderate stable cardiomegaly. Calcified plaque over the aortic arch. Stable right paramediastinal density. Remainder the exam is unchanged.  IMPRESSION: No active cardiopulmonary disease.  Stable cardiomegaly.   Electronically Signed   By: Elberta Fortis M.D.   On: 11/24/2014 18:51     EKG Interpretation   Date/Time:  Friday November 24 2014 17:04:17 EDT Ventricular Rate:  99 PR Interval:    QRS Duration: 76 QT Interval:  364 QTC Calculation: 467 R Axis:   161 Text Interpretation:  A fib Low voltage QRS Left posterior fascicular  block Poor baseline Abnormal ECG Confirmed by Ina Scrivens  MD, Keyani Rigdon ([id number]) on  11/24/2014 6:35:48 PM Also confirmed by Jodi Mourning  MD, Jordain Radin ([id number])  on  11/24/2014 10:09:06 PM      MDM   Final diagnoses:  Hemoptysis   Patient presents with hemoptysis recurrent. Patient on Xarelto. CT scan ordered to look for signs of hemorrhage, worsening blood clots or other infection that would cause her new symptoms. Patient is on Xarelto. Patient's vitals stable in the ER, no respiratory difficulty. Chest x-ray reviewed no acute findings.  Patient observed in the ER, no chest  pain, no episodes of hemoptysis. Difficult IV, ultrasound-guided placed. IV stopped working, discussed with family regarding risks and benefits of CT  scan and they're comfortable with holding at this time and will monitor clinically and return to the ER over the weekend if symptoms or signs worsened.  Results and differential diagnosis were discussed with the patient/parent/guardian. Close follow up outpatient was discussed, comfortable with the plan.   Medications - No data to display  Filed Vitals:   11/24/14 1915 11/24/14 1924 11/24/14 1945 11/24/14 2152  BP: 135/92 135/92 138/87 166/91  Pulse: 110 60 72 67  Temp:      TempSrc:      Resp:  Weight:      SpO2: 100% 100% 100% 98%    Final diagnoses:  Hemoptysis        Blane Ohara, MD 11/24/14 2209  Blane Ohara, MD 11/24/14 2257

## 2014-11-24 NOTE — ED Notes (Signed)
This nurse tried unsuccessfully

## 2014-11-24 NOTE — Telephone Encounter (Signed)
Donita with [facility] called - pt had an amount of mucous with coffee grounds and noted rales while listening to chest. Pt is on  Xarelto. Suggest for family to take pt to ER - Donita already told daughter to take pt ER - there are no open appt in clinic for this PM. Stanton Kidney Kasiya Burck RN 11/24/14 2:30PM

## 2014-11-24 NOTE — Discharge Instructions (Signed)
See your physician on Monday for reassessment. If your bleeding worsens, you pass out, you develop chest pain or new concerns combat to the ER for further evaluation and likely CAT scan.  If you were given medicines take as directed.  If you are on coumadin or contraceptives realize their levels and effectiveness is altered by many different medicines.  If you have any reaction (rash, tongues swelling, other) to the medicines stop taking and see a physician.   Please follow up as directed and return to the ER or see a physician for new or worsening symptoms.  Thank you. Filed Vitals:   11/24/14 1915 11/24/14 1924 11/24/14 1945 11/24/14 2152  BP: 135/92 135/92 138/87 166/91  Pulse: 110 60 72 67  Temp:      TempSrc:      Resp:  19 15 17   Weight:      SpO2: 100% 100% 100% 98%

## 2014-11-25 DIAGNOSIS — I509 Heart failure, unspecified: Secondary | ICD-10-CM | POA: Diagnosis not present

## 2014-11-27 ENCOUNTER — Telehealth: Payer: Self-pay | Admitting: *Deleted

## 2014-11-27 ENCOUNTER — Encounter (HOSPITAL_COMMUNITY): Payer: Medicare Other

## 2014-11-27 NOTE — Telephone Encounter (Addendum)
Daughter of pt called - appt made 12/04/14 2:15PM Dr Zada Girt - no open appt this week. Name was placed on waiting list for this week. States pt is stable for now - will call if any change. Discuss Lasix instructions - hold if wt is 150lbs or less. Med was given this AM and daughter states wt was 130lbs. Stanton Kidney Jeneen Doutt RN 11/27/14 11:30AM

## 2014-11-28 ENCOUNTER — Other Ambulatory Visit: Payer: Self-pay | Admitting: Internal Medicine

## 2014-11-28 NOTE — Telephone Encounter (Signed)
Request was approved through 11/06/2015 .Kingsley Spittle Cassady4/12/201610:48 AM

## 2014-11-30 DIAGNOSIS — Z5181 Encounter for therapeutic drug level monitoring: Secondary | ICD-10-CM | POA: Diagnosis not present

## 2014-11-30 DIAGNOSIS — I10 Essential (primary) hypertension: Secondary | ICD-10-CM | POA: Diagnosis not present

## 2014-11-30 DIAGNOSIS — I5040 Unspecified combined systolic (congestive) and diastolic (congestive) heart failure: Secondary | ICD-10-CM | POA: Diagnosis not present

## 2014-11-30 DIAGNOSIS — E119 Type 2 diabetes mellitus without complications: Secondary | ICD-10-CM | POA: Diagnosis not present

## 2014-11-30 DIAGNOSIS — Z7901 Long term (current) use of anticoagulants: Secondary | ICD-10-CM | POA: Diagnosis not present

## 2014-12-01 ENCOUNTER — Ambulatory Visit (HOSPITAL_COMMUNITY)
Admission: RE | Admit: 2014-12-01 | Discharge: 2014-12-01 | Disposition: A | Discharge status: Home / Self Care | Payer: Medicare Other | Source: Ambulatory Visit | Attending: Cardiology | Admitting: Cardiology

## 2014-12-01 ENCOUNTER — Encounter (HOSPITAL_COMMUNITY): Payer: Self-pay | Admitting: Cardiology

## 2014-12-01 VITALS — BP 120/70 | HR 72 | Wt 138.4 lb

## 2014-12-01 DIAGNOSIS — I482 Chronic atrial fibrillation, unspecified: Secondary | ICD-10-CM

## 2014-12-01 DIAGNOSIS — I5022 Chronic systolic (congestive) heart failure: Secondary | ICD-10-CM | POA: Insufficient documentation

## 2014-12-01 DIAGNOSIS — I2699 Other pulmonary embolism without acute cor pulmonale: Secondary | ICD-10-CM | POA: Insufficient documentation

## 2014-12-01 DIAGNOSIS — R634 Abnormal weight loss: Secondary | ICD-10-CM

## 2014-12-01 DIAGNOSIS — R042 Hemoptysis: Secondary | ICD-10-CM | POA: Diagnosis not present

## 2014-12-01 NOTE — Patient Instructions (Signed)
Non-Cardiac CT scanning, (CAT scanning), is a noninvasive, special x-ray that produces cross-sectional images of the body using x-rays and a computer. CT scans help physicians diagnose and treat medical conditions. For some CT exams, a contrast material is used to enhance visibility in the area of the body being studied. CT scans provide greater clarity and reveal more details than regular x-ray exams.  Your physician recommends that you schedule a follow-up appointment in: 1 month

## 2014-12-03 DIAGNOSIS — I5022 Chronic systolic (congestive) heart failure: Secondary | ICD-10-CM | POA: Insufficient documentation

## 2014-12-03 NOTE — Progress Notes (Signed)
Patient ID: [name], female   DOB: [birth year], 81 y.o.   MRN: [id number] PCP: Dr Loma Newton  HPI: [name] is an 81 year old with a history of HTN, DM2, Asthma, Churg-Strauss syndrome,chronic atrial fibrillation on xarelto, PE, night time oxygen,and chronic systolic HF EF 20-25% (nonischemic cardiomyopathy). She uses home oxygen with exertion and at night.   Admitted in early February 2016 with hypoxia and volume overload. Diuresed with IV lasix and transitioned to po lasix 120 mg twice a day + metolazone every Friday. Discharge weight was 165 pounds. [facility] to follow.   She returns for followup.  She had an episode of significant hemoptysis on 4/8 and went to ER.  They could not get an IV so CT was not done.  She was continued on Xarelto and has had no further hemoptysis.  She continues to lose weight, 14 lbs down since last appointment. She walks with a walker around the house without dyspnea.  No chest pain.  No lightheadedness, falls, or orthopnea/PND.  She has not been taking Lasix or metolazone.   ROS: All systems negative except as listed in HPI, PMH and Problem List.  SH:  History   Social History  . Marital Status: Widowed    Spouse Name: N/A  . Number of Children: N/A  . Years of Education: N/A   Occupational History  . Retired    Social History Main Topics  . Smoking status: Never Smoker   . Smokeless tobacco: Never Used  . Alcohol Use: No  . Drug Use: No  . Sexual Activity: Not on file   Other Topics Concern  . Not on file   Social History Narrative   Lives in house, with family just next door.    Never smoker, no alcohol.      Used to work at [employer].    Before that, worked in food prep.                    FH:  Family History  Problem Relation Age of Onset  . Diabetes Mother   . Diabetes Father   . Heart disease Neg Hx     Past Medical History  Diagnosis Date  . Hypertension   . Asthma   . Osteoarthritis     left shoulder  .  Pseudogout   . Hypokalemia     a. Previously felt due to diuretics, req supplementation.  . Churg-Strauss syndrome     a. Sural nerve biopsy 02/2000.  Darene Lamer disease     a. s/p thyroidectomy.  . Glaucoma   . Hypercholesteremia   . Venous insufficiency   . Osteopenia     DEXA 2004  . Lipoma     left inner thigh  . Cataract     left eye  . VASCULITIS   . Chronic combined systolic and diastolic heart failure     a. 11/979 EF:20-25%  . Pleural effusion, left     a. Thoracentesis 2001 - per notes, no malignant cells, was transudative.  Marland Kitchen NSVT (nonsustained ventricular tachycardia)     a. During CHF adm 2001.  . Multifocal atrial tachycardia     a. Documented as OP 01/2012.  Marland Kitchen Ectopic atrial tachycardia     a. Documented on tele as IP 02/2012.  . Pulmonary HTN     a. Mod by echo 02/2012.  . Valvular heart disease     a. Echo 02/2012: mod MR/TR, mild AI.  Marland Kitchen Pericardial  effusion     a. Echo 02/2012: small-mod pericardial effusion.  . Atrial fibrillation, permanent     a. Dx 04/2013, on metoprolol, dig and xarelto  . Shortness of breath   . CHF (congestive heart failure)   . On home oxygen therapy     "wear it mostly at night" (09/20/2014)  . Pneumonia 1930's  . Type II diabetes mellitus     Current Outpatient Prescriptions  Medication Sig Dispense Refill  . acetaminophen (TYLENOL) 500 MG tablet Take 1,000 mg by mouth every 6 (six) hours as needed (leg pain).    Marland Kitchen acetaminophen-codeine (TYLENOL #3) 300-30 MG per tablet Take 1 tablet by mouth every 6 (six) hours as needed for moderate pain or severe pain. 60 tablet 2  . albuterol (PROVENTIL HFA;VENTOLIN HFA) 108 (90 BASE) MCG/ACT inhaler Inhale 1-2 puffs into the lungs every 6 (six) hours as needed for shortness of breath. 18 g 11  . brimonidine (ALPHAGAN P) 0.1 % SOLN Place 1 drop into the left eye every 12 (twelve) hours. 15 mL 1  . diclofenac sodium (VOLTAREN) 1 % GEL Apply 2 g topically 4 (four) times daily as needed. 100 g 0  .  dorzolamide (TRUSOPT) 2 % ophthalmic solution Place 1 drop into the left eye 2 (two) times daily. 10 mL 3  . DULERA 100-5 MCG/ACT AERO INHALE 2 PUFFS BY MOUTH TWICE DAILY 13 g 1  . ferrous sulfate 325 (65 FE) MG tablet Take 1 tablet (325 mg total) by mouth daily. 30 tablet 3  . lisinopril (PRINIVIL,ZESTRIL) 10 MG tablet Take 1 tablet (10 mg total) by mouth daily. 30 tablet 1  . metFORMIN (GLUCOPHAGE) 1000 MG tablet TAKE 1 TABLET BY MOUTH TWICE DAILY WITH A MEAL 60 tablet 3  . metolazone (ZAROXOLYN) 2.5 MG tablet Take 1 tablet (2.5 mg total) by mouth once a week. On Fridays 30 tablet 0  . metoprolol succinate (TOPROL-XL) 50 MG 24 hr tablet TAKE 2 TABLETS BY MOUTH EVERY MORNING AND 1 TABLET BY MOUTH EVERY EVENING 90 tablet 1  . ranitidine (ZANTAC) 75 MG tablet Take 1 tablet (75 mg total) by mouth 2 (two) times daily. 30 tablet 1  . spironolactone (ALDACTONE) 25 MG tablet TAKE 1 TABLET BY MOUTH EVERY DAY 30 tablet 1  . XARELTO 20 MG TABS tablet TAKE 1 TABLET BY MOUTH DAILY WITH SUPPER 30 tablet 0  . furosemide (LASIX) 80 MG tablet Take 1.5 tablets (120 mg total) by mouth 2 (two) times daily. Hold if weight is 150 lb or less (Patient not taking: Reported on 11/24/2014) 180 tablet 3  . potassium chloride SA (K-DUR,KLOR-CON) 20 MEQ tablet Take 2 tablets (40 mEq total) by mouth daily. Only take this medication on days you take Lasix. (Patient not taking: Reported on 11/24/2014) 60 tablet 0   No current facility-administered medications for this encounter.    Filed Vitals:   12/01/14 1444  BP: 120/70  Pulse: 72  Weight: 138 lb 6.4 oz (62.778 kg)  SpO2: 93%   Physical Exam:  General: Elderly Sitting in the chair. NAD HEENT: normal  Neck: supple. JVP 6-7.  Carotids 2+ bilat; no bruits. No lymphadenopathy or thryomegaly appreciated.  Cor: PMI nondisplaced. Irregular rate & rhythm. 2/6 MR/TR apex/LLSB  Lungs: dimished but clear. On 2 liters nasal cannula.  Abdomen: soft, nontender, nondistended. No  hepatosplenomegaly. No bruits or masses. Good bowel sounds.  Extremities: no cyanosis, clubbing, rash, edema  Neuro: alert & orientedx3, cranial nerves grossly intact. moves all  4 extremities w/o difficulty. Affect pleasant  ASSESSMENT & PLAN: 1. Chronic systolic CHF: Nonischemic cardiomyopathy.  Echo 09/21/2014 EF 20-25% with moderate to severe RV dysfunction.  NYHA III.  Weight continues to fall.  She has not taken any Lasix recently or metolazone but volume status looks ok.   - Continue current lisinopril and Toprol XL.  2. Chronic atrial fibrillation:  CHADSVASC = 6. Rate controlled on Toprol XL 100 mg in am and 50 mg in pm. On Xarelto 20 mg daily. Recent hemoglobin ok.  3. H/O PE: On xarelto 20 mg daily  4. Weight loss: Continues despite reasonable appetite and no diuretic use.  Given recent episode of hemoptysis, needs CT chest to r/o malignancy.  Given weight loss, I will also get CT abdomen/pelvis.   Followup in 1 month.   Marca Ancona   12/03/2014

## 2014-12-04 ENCOUNTER — Ambulatory Visit: Payer: Medicare Other | Admitting: Internal Medicine

## 2014-12-05 ENCOUNTER — Telehealth: Payer: Self-pay | Admitting: *Deleted

## 2014-12-05 NOTE — Telephone Encounter (Signed)
[name] with [facility] [phone] called for an extension on home PT for 4 more weeks. Stanton Kidney Abdulaziz Toman RN 12/05/14 9:15AM

## 2014-12-05 NOTE — Telephone Encounter (Signed)
Yes, please extend home PT for an additional 4 weeks.

## 2014-12-07 ENCOUNTER — Telehealth: Payer: Self-pay | Admitting: *Deleted

## 2014-12-07 ENCOUNTER — Encounter (HOSPITAL_COMMUNITY): Payer: Self-pay

## 2014-12-07 ENCOUNTER — Ambulatory Visit (HOSPITAL_COMMUNITY)
Admission: RE | Admit: 2014-12-07 | Discharge: 2014-12-07 | Disposition: A | Discharge status: Home / Self Care | Payer: Medicare Other | Source: Ambulatory Visit | Attending: Cardiology | Admitting: Cardiology

## 2014-12-07 DIAGNOSIS — J984 Other disorders of lung: Secondary | ICD-10-CM | POA: Diagnosis not present

## 2014-12-07 DIAGNOSIS — K7689 Other specified diseases of liver: Secondary | ICD-10-CM | POA: Diagnosis not present

## 2014-12-07 DIAGNOSIS — K8020 Calculus of gallbladder without cholecystitis without obstruction: Secondary | ICD-10-CM | POA: Diagnosis not present

## 2014-12-07 DIAGNOSIS — R042 Hemoptysis: Secondary | ICD-10-CM | POA: Insufficient documentation

## 2014-12-07 DIAGNOSIS — J9811 Atelectasis: Secondary | ICD-10-CM | POA: Diagnosis not present

## 2014-12-07 DIAGNOSIS — R634 Abnormal weight loss: Secondary | ICD-10-CM | POA: Insufficient documentation

## 2014-12-07 MED ORDER — IOHEXOL 300 MG/ML  SOLN
100.0000 mL | Freq: Once | INTRAMUSCULAR | Status: AC | PRN
  Administered 2014-12-07: 100 mL via INTRAVENOUS

## 2014-12-07 NOTE — Telephone Encounter (Signed)
Received PA request from pt's pharmacy for her xarelto.  Pt with A-Fib, heart failure, hx pulmonary embolism.  Medication approved through 12/07/2015.  Will inform patient and pharmac.Kingsley Spittle Cassady4/21/201610:24 AM     [facility] [phone] ID #: [id number] Ref# [id number]

## 2014-12-08 ENCOUNTER — Ambulatory Visit (INDEPENDENT_AMBULATORY_CARE_PROVIDER_SITE_OTHER): Payer: Medicare Other | Admitting: Internal Medicine

## 2014-12-08 ENCOUNTER — Encounter: Payer: Self-pay | Admitting: Internal Medicine

## 2014-12-08 VITALS — BP 164/73 | HR 71 | Temp 98.6°F | Ht 67.0 in | Wt 143.4 lb

## 2014-12-08 DIAGNOSIS — I5022 Chronic systolic (congestive) heart failure: Secondary | ICD-10-CM

## 2014-12-08 DIAGNOSIS — I10 Essential (primary) hypertension: Secondary | ICD-10-CM | POA: Diagnosis not present

## 2014-12-08 DIAGNOSIS — E119 Type 2 diabetes mellitus without complications: Secondary | ICD-10-CM

## 2014-12-08 DIAGNOSIS — I700 Atherosclerosis of aorta: Secondary | ICD-10-CM | POA: Insufficient documentation

## 2014-12-08 LAB — GLUCOSE, CAPILLARY: GLUCOSE-CAPILLARY: 82 mg/dL (ref 70–99)

## 2014-12-08 LAB — POCT GLYCOSYLATED HEMOGLOBIN (HGB A1C): HEMOGLOBIN A1C: 6.6

## 2014-12-08 MED ORDER — FERROUS SULFATE 325 (65 FE) MG PO TABS: 325.0000 mg | ORAL_TABLET | Freq: Every day | ORAL | Status: DC

## 2014-12-08 MED ORDER — METFORMIN HCL 1000 MG PO TABS: 1000.0000 mg | ORAL_TABLET | Freq: Two times a day (BID) | ORAL | Status: DC

## 2014-12-08 MED ORDER — LISINOPRIL 10 MG PO TABS: 10.0000 mg | ORAL_TABLET | Freq: Every day | ORAL | Status: DC

## 2014-12-08 NOTE — Assessment & Plan Note (Signed)
BP Readings from Last 3 Encounters:  12/08/14 164/73  11/24/14 169/85  11/01/14 145/75    Lab Results  Component Value Date   NA 138 11/24/2014   K 3.8 11/24/2014   CREATININE 0.73 11/24/2014    Assessment: Blood pressure control: moderately elevated Progress toward BP goal:  deteriorated Comments: she held Lisinopril for 2 days for her ct scan which was done yesterday.   Plan: Medications:  Toprol 50 mg daily. Lisinopril 10 mg daily. She also has prescriptions of furosemide and metolazone which she only takes when her weight is over 153pounds Educational resources provided: brochure (denies) Self management tools provided:   Other plans: Routine follow-up. I expect her blood pressure to stabilize when she gets back on her lisinopril.

## 2014-12-08 NOTE — Patient Instructions (Signed)
General Instructions: I have called in all your medications  Please continue with the good job of keeping your weight down  Please follow up with heart doctor Please come back her in 3 months   Thank you for bringing your medicines today. This helps [place] keep you safe from mistakes.   Progress Toward Treatment Goals:  Treatment Goal 12/08/2014  Hemoglobin A1C at goal  Blood pressure deteriorated    Self Care Goals & Plans:  Self Care Goal 12/08/2014  Manage my medications take my medicines as prescribed; bring my medications to every visit; refill my medications on time  Monitor my health -  Eat healthy foods drink diet soda or water instead of juice or soda; eat more vegetables; eat foods that are low in salt; eat baked foods instead of fried foods  Be physically active -  Other -  Meeting treatment goals -    Home Blood Glucose Monitoring 12/08/2014  Check my blood sugar once a day  When to check my blood sugar before breakfast     Care Management & Community Referrals:  Referral 12/08/2014  Referrals made for care management support none needed  Referrals made to community resources -

## 2014-12-08 NOTE — Assessment & Plan Note (Signed)
Volume status is normal. Her weight is slightly high by 5 pounds in the last 7 days (from 138 pounds on 4/15 to 143 pounds today). Her lung exam is unremarkable. She follows up closely with heart failure clinic. During her last visit on 12/01/2014, a CT scan of the chest, pelvis and abdomen was ordered due to patient reported a history of hemoptysis. It was done yesterday and does not reveal acute abnormalities. Discussed the results with her. Plan Continue with plan as per cardiology regarding her furosemide 80 mg daily and metolazone 2.5 mg daily when her weight is above 143lb. She has been off diuretics for some time. The family understands this plan. She is being weighed daily.

## 2014-12-08 NOTE — Progress Notes (Signed)
Patient ID: [name], female   DOB: 1926/02/22, 81 y.o.   MRN: [id number]   Subjective:   HPI: [name] is a 81 y.o. woman with past medical history below is brought in by her son for a routine folllow up visit.   She is feeling well. She had a ct scan of abd, pelvis and chest yesterday and I have reviewed the results with her son. No acute finding.   Kindly see the A&P for the status of the pt's chronic medical problems.   Past Medical History  Diagnosis Date  . Hypertension   . Asthma   . Osteoarthritis     left shoulder  . Pseudogout   . Hypokalemia     a. Previously felt due to diuretics, req supplementation.  . Churg-Strauss syndrome     a. Sural nerve biopsy 02/2000.  Darene Lamer disease     a. s/p thyroidectomy.  . Glaucoma   . Hypercholesteremia   . Venous insufficiency   . Osteopenia     DEXA 2004  . Lipoma     left inner thigh  . Cataract     left eye  . VASCULITIS   . Chronic combined systolic and diastolic heart failure     a. 04/5092 EF:20-25%  . Pleural effusion, left     a. Thoracentesis 2001 - per notes, no malignant cells, was transudative.  Marland Kitchen NSVT (nonsustained ventricular tachycardia)     a. During CHF adm 2001.  . Multifocal atrial tachycardia     a. Documented as OP 01/2012.  Marland Kitchen Ectopic atrial tachycardia     a. Documented on tele as IP 02/2012.  . Pulmonary HTN     a. Mod by echo 02/2012.  . Valvular heart disease     a. Echo 02/2012: mod MR/TR, mild AI.  Marland Kitchen Pericardial effusion     a. Echo 02/2012: small-mod pericardial effusion.  . Atrial fibrillation, permanent     a. Dx 04/2013, on metoprolol, dig and xarelto  . Shortness of breath   . CHF (congestive heart failure)   . On home oxygen therapy     "wear it mostly at night" (09/20/2014)  . Pneumonia 1930's  . Type II diabetes mellitus     ROS: Constitutional:  Denies fevers, chills, diaphoresis, appetite change and fatigue.  Respiratory: Denies SOB, DOE, cough, chest tightness, and  wheezing.  CVS: No chest pain, palpitations and leg swelling.  GI: No abdominal pain, nausea, vomiting, bloody stools GU: No dysuria, frequency, hematuria, or flank pain.  MSK: No myalgias, back pain, joint swelling, arthralgias  Psych: No depression symptoms. No SI or SA.    Objective:  Physical Exam: Filed Vitals:   12/08/14 1442  BP: 164/73  Pulse: 71  Temp: 98.6 F (37 C)  TempSrc: Oral  Height: 5\' 7"  (1.702 m)  Weight: 143 lb 6.4 oz (65.046 kg)  SpO2: 99%   General: frail old woman. No acute distress. Son in the room. HEENT: Normal oral mucosa. MMM.  Lungs: CTA bilaterally. No wheezing. No rales.  Heart: irregular HR, no extra sounds or murmurs. No JVD.  Abdomen: Non-distended, normal bowel sounds, soft, nontender; no hepatosplenomegaly  Extremities: Bilateral pedal edema with dry skin. Mostly non pitting.  No joint swelling or tenderness. Neurologic: Normal EOM,  Alert and oriented x3. No obvious neurologic/cranial nerve deficits.  Assessment & Plan:  Discussed case with Dr Rogelia Boga. See problem based charting for assessment and plan.

## 2014-12-08 NOTE — Progress Notes (Signed)
Internal Medicine Clinic Attending  Case discussed with Dr. Kazibwe soon after the resident saw the patient.  We reviewed the resident's history and exam and pertinent patient test results.  I agree with the assessment, diagnosis, and plan of care documented in the resident's note. 

## 2014-12-08 NOTE — Assessment & Plan Note (Signed)
Lab Results  Component Value Date   HGBA1C 6.6 12/08/2014   HGBA1C 7.6 09/05/2014   HGBA1C 7.9* 04/05/2014     Assessment: Diabetes control: good control (HgbA1C at goal) Progress toward A1C goal:  at goal Comments: compliant. Refilled meds.   Plan: Medications:  continue with Metformin 1000 mg bid.  Home glucose monitoring: Frequency: once a day Timing: before breakfast Instruction/counseling given: no instruction/counseling  Educational resources provided: brochure (denies) Self management tools provided:   Other plans: routine follow up in 3 months.

## 2014-12-11 NOTE — Telephone Encounter (Signed)
Message  Left on [facility] ID recording - Dr Andrey Campanile  OK extension PT.

## 2014-12-12 DIAGNOSIS — I5040 Unspecified combined systolic (congestive) and diastolic (congestive) heart failure: Secondary | ICD-10-CM | POA: Diagnosis not present

## 2014-12-12 DIAGNOSIS — E119 Type 2 diabetes mellitus without complications: Secondary | ICD-10-CM | POA: Diagnosis not present

## 2014-12-12 DIAGNOSIS — I10 Essential (primary) hypertension: Secondary | ICD-10-CM | POA: Diagnosis not present

## 2014-12-12 DIAGNOSIS — Z5181 Encounter for therapeutic drug level monitoring: Secondary | ICD-10-CM | POA: Diagnosis not present

## 2014-12-12 DIAGNOSIS — Z7901 Long term (current) use of anticoagulants: Secondary | ICD-10-CM | POA: Diagnosis not present

## 2014-12-15 ENCOUNTER — Telehealth: Payer: Self-pay | Admitting: *Deleted

## 2014-12-15 DIAGNOSIS — I10 Essential (primary) hypertension: Secondary | ICD-10-CM | POA: Diagnosis not present

## 2014-12-15 DIAGNOSIS — I5040 Unspecified combined systolic (congestive) and diastolic (congestive) heart failure: Secondary | ICD-10-CM | POA: Diagnosis not present

## 2014-12-15 DIAGNOSIS — Z7901 Long term (current) use of anticoagulants: Secondary | ICD-10-CM | POA: Diagnosis not present

## 2014-12-15 DIAGNOSIS — Z5181 Encounter for therapeutic drug level monitoring: Secondary | ICD-10-CM | POA: Diagnosis not present

## 2014-12-15 DIAGNOSIS — E119 Type 2 diabetes mellitus without complications: Secondary | ICD-10-CM | POA: Diagnosis not present

## 2014-12-15 NOTE — Telephone Encounter (Signed)
PT calls on visit today just after starting ambulation, pt c/o shortness of breath and weakness pt was on 1 1/2 l/[place] O2, sat pt down, bp 158/110, turned O2 up to 2l/min, in less than 1 min O2 sat to above 90% and bp to 149/75. Pt had no other complaints. Pt next appt is in June w/ dr Andrey Campanile. Please advise

## 2014-12-15 NOTE — Telephone Encounter (Signed)
To ED if sxs reoccur. Can she wait on toilet and walker? If not, make appt.  Thanks

## 2014-12-15 NOTE — Telephone Encounter (Signed)
i called pt's home, daughter states pt is fine now, it was just getting up that distressed her. She also states pt needs a new walker and bedside commode

## 2014-12-19 DIAGNOSIS — Z5181 Encounter for therapeutic drug level monitoring: Secondary | ICD-10-CM | POA: Diagnosis not present

## 2014-12-19 DIAGNOSIS — E119 Type 2 diabetes mellitus without complications: Secondary | ICD-10-CM | POA: Diagnosis not present

## 2014-12-19 DIAGNOSIS — I10 Essential (primary) hypertension: Secondary | ICD-10-CM | POA: Diagnosis not present

## 2014-12-19 DIAGNOSIS — I5040 Unspecified combined systolic (congestive) and diastolic (congestive) heart failure: Secondary | ICD-10-CM | POA: Diagnosis not present

## 2014-12-19 DIAGNOSIS — Z7901 Long term (current) use of anticoagulants: Secondary | ICD-10-CM | POA: Diagnosis not present

## 2014-12-20 ENCOUNTER — Telehealth: Payer: Self-pay | Admitting: *Deleted

## 2014-12-20 DIAGNOSIS — I10 Essential (primary) hypertension: Secondary | ICD-10-CM | POA: Diagnosis not present

## 2014-12-20 DIAGNOSIS — Z5181 Encounter for therapeutic drug level monitoring: Secondary | ICD-10-CM | POA: Diagnosis not present

## 2014-12-20 DIAGNOSIS — E119 Type 2 diabetes mellitus without complications: Secondary | ICD-10-CM | POA: Diagnosis not present

## 2014-12-20 DIAGNOSIS — I5040 Unspecified combined systolic (congestive) and diastolic (congestive) heart failure: Secondary | ICD-10-CM | POA: Diagnosis not present

## 2014-12-20 DIAGNOSIS — Z7901 Long term (current) use of anticoagulants: Secondary | ICD-10-CM | POA: Diagnosis not present

## 2014-12-20 NOTE — Telephone Encounter (Signed)
Ok

## 2014-12-20 NOTE — Telephone Encounter (Signed)
Call from [facility] RN with [facility]  Pt has completed her time with home health nursing.  She is still receiving PT. FYI

## 2014-12-21 DIAGNOSIS — E119 Type 2 diabetes mellitus without complications: Secondary | ICD-10-CM | POA: Diagnosis not present

## 2014-12-21 DIAGNOSIS — Z7901 Long term (current) use of anticoagulants: Secondary | ICD-10-CM | POA: Diagnosis not present

## 2014-12-21 DIAGNOSIS — I5040 Unspecified combined systolic (congestive) and diastolic (congestive) heart failure: Secondary | ICD-10-CM | POA: Diagnosis not present

## 2014-12-21 DIAGNOSIS — I509 Heart failure, unspecified: Secondary | ICD-10-CM | POA: Diagnosis not present

## 2014-12-21 DIAGNOSIS — I10 Essential (primary) hypertension: Secondary | ICD-10-CM | POA: Diagnosis not present

## 2014-12-21 DIAGNOSIS — Z5181 Encounter for therapeutic drug level monitoring: Secondary | ICD-10-CM | POA: Diagnosis not present

## 2014-12-25 DIAGNOSIS — I509 Heart failure, unspecified: Secondary | ICD-10-CM | POA: Diagnosis not present

## 2014-12-26 DIAGNOSIS — Z5181 Encounter for therapeutic drug level monitoring: Secondary | ICD-10-CM | POA: Diagnosis not present

## 2014-12-26 DIAGNOSIS — I10 Essential (primary) hypertension: Secondary | ICD-10-CM | POA: Diagnosis not present

## 2014-12-26 DIAGNOSIS — E119 Type 2 diabetes mellitus without complications: Secondary | ICD-10-CM | POA: Diagnosis not present

## 2014-12-26 DIAGNOSIS — Z7901 Long term (current) use of anticoagulants: Secondary | ICD-10-CM | POA: Diagnosis not present

## 2014-12-26 DIAGNOSIS — I5040 Unspecified combined systolic (congestive) and diastolic (congestive) heart failure: Secondary | ICD-10-CM | POA: Diagnosis not present

## 2014-12-28 ENCOUNTER — Other Ambulatory Visit: Payer: Self-pay | Admitting: *Deleted

## 2014-12-28 DIAGNOSIS — Z5181 Encounter for therapeutic drug level monitoring: Secondary | ICD-10-CM | POA: Diagnosis not present

## 2014-12-28 DIAGNOSIS — E119 Type 2 diabetes mellitus without complications: Secondary | ICD-10-CM | POA: Diagnosis not present

## 2014-12-28 DIAGNOSIS — Z7901 Long term (current) use of anticoagulants: Secondary | ICD-10-CM | POA: Diagnosis not present

## 2014-12-28 DIAGNOSIS — I5040 Unspecified combined systolic (congestive) and diastolic (congestive) heart failure: Secondary | ICD-10-CM | POA: Diagnosis not present

## 2014-12-28 DIAGNOSIS — I10 Essential (primary) hypertension: Secondary | ICD-10-CM | POA: Diagnosis not present

## 2014-12-29 ENCOUNTER — Other Ambulatory Visit: Payer: Self-pay | Admitting: Internal Medicine

## 2014-12-29 MED ORDER — GLUCOSE BLOOD VI STRP: ORAL_STRIP | Status: DC

## 2014-12-29 NOTE — Telephone Encounter (Signed)
Called pt's pharm per r wilson, pt's spirolactone was filled and picked up 12/02/2014, pharmacy states pt is on automatic refill request and daughter usually let's them know if she is not taking something and they do not have a stop on it, tried to call daughter and left message for a return call to confirm that pt has continued to take the medicine

## 2014-12-30 ENCOUNTER — Other Ambulatory Visit: Payer: Self-pay | Admitting: Internal Medicine

## 2014-12-31 ENCOUNTER — Other Ambulatory Visit: Payer: Self-pay | Admitting: Internal Medicine

## 2015-01-01 ENCOUNTER — Other Ambulatory Visit: Payer: Self-pay | Admitting: Internal Medicine

## 2015-01-01 ENCOUNTER — Encounter (HOSPITAL_COMMUNITY): Payer: Medicare Other

## 2015-01-02 DIAGNOSIS — E119 Type 2 diabetes mellitus without complications: Secondary | ICD-10-CM | POA: Diagnosis not present

## 2015-01-02 DIAGNOSIS — Z7901 Long term (current) use of anticoagulants: Secondary | ICD-10-CM | POA: Diagnosis not present

## 2015-01-02 DIAGNOSIS — I5040 Unspecified combined systolic (congestive) and diastolic (congestive) heart failure: Secondary | ICD-10-CM | POA: Diagnosis not present

## 2015-01-02 DIAGNOSIS — I10 Essential (primary) hypertension: Secondary | ICD-10-CM | POA: Diagnosis not present

## 2015-01-02 DIAGNOSIS — Z5181 Encounter for therapeutic drug level monitoring: Secondary | ICD-10-CM | POA: Diagnosis not present

## 2015-01-04 DIAGNOSIS — Z5181 Encounter for therapeutic drug level monitoring: Secondary | ICD-10-CM | POA: Diagnosis not present

## 2015-01-04 DIAGNOSIS — Z7901 Long term (current) use of anticoagulants: Secondary | ICD-10-CM | POA: Diagnosis not present

## 2015-01-04 DIAGNOSIS — I10 Essential (primary) hypertension: Secondary | ICD-10-CM | POA: Diagnosis not present

## 2015-01-04 DIAGNOSIS — E119 Type 2 diabetes mellitus without complications: Secondary | ICD-10-CM | POA: Diagnosis not present

## 2015-01-04 DIAGNOSIS — I5040 Unspecified combined systolic (congestive) and diastolic (congestive) heart failure: Secondary | ICD-10-CM | POA: Diagnosis not present

## 2015-01-07 ENCOUNTER — Other Ambulatory Visit: Payer: Self-pay | Admitting: Internal Medicine

## 2015-01-08 ENCOUNTER — Other Ambulatory Visit: Payer: Self-pay | Admitting: Internal Medicine

## 2015-01-18 ENCOUNTER — Other Ambulatory Visit: Payer: Self-pay | Admitting: *Deleted

## 2015-01-18 DIAGNOSIS — I10 Essential (primary) hypertension: Secondary | ICD-10-CM

## 2015-01-19 MED ORDER — LISINOPRIL 10 MG PO TABS: 10.0000 mg | ORAL_TABLET | Freq: Every day | ORAL | Status: DC

## 2015-01-25 DIAGNOSIS — I509 Heart failure, unspecified: Secondary | ICD-10-CM | POA: Diagnosis not present

## 2015-01-27 ENCOUNTER — Other Ambulatory Visit: Payer: Self-pay | Admitting: Internal Medicine

## 2015-01-30 ENCOUNTER — Telehealth: Payer: Self-pay | Admitting: Internal Medicine

## 2015-01-30 NOTE — Telephone Encounter (Signed)
Call to patient to confirm appointment for 6/1 at 2:15 lmtcb

## 2015-01-31 ENCOUNTER — Ambulatory Visit (INDEPENDENT_AMBULATORY_CARE_PROVIDER_SITE_OTHER): Payer: Medicare Other | Admitting: Internal Medicine

## 2015-01-31 ENCOUNTER — Encounter: Payer: Self-pay | Admitting: Internal Medicine

## 2015-01-31 VITALS — BP 125/64 | HR 70 | Temp 98.0°F | Ht 67.0 in | Wt 147.2 lb

## 2015-01-31 DIAGNOSIS — J45909 Unspecified asthma, uncomplicated: Secondary | ICD-10-CM

## 2015-01-31 DIAGNOSIS — R911 Solitary pulmonary nodule: Secondary | ICD-10-CM

## 2015-01-31 DIAGNOSIS — I482 Chronic atrial fibrillation, unspecified: Secondary | ICD-10-CM

## 2015-01-31 DIAGNOSIS — I10 Essential (primary) hypertension: Secondary | ICD-10-CM

## 2015-01-31 DIAGNOSIS — Z7901 Long term (current) use of anticoagulants: Secondary | ICD-10-CM | POA: Diagnosis not present

## 2015-01-31 DIAGNOSIS — I5042 Chronic combined systolic (congestive) and diastolic (congestive) heart failure: Secondary | ICD-10-CM | POA: Diagnosis not present

## 2015-01-31 DIAGNOSIS — E119 Type 2 diabetes mellitus without complications: Secondary | ICD-10-CM

## 2015-01-31 DIAGNOSIS — K219 Gastro-esophageal reflux disease without esophagitis: Secondary | ICD-10-CM

## 2015-01-31 DIAGNOSIS — Z79899 Other long term (current) drug therapy: Secondary | ICD-10-CM

## 2015-01-31 DIAGNOSIS — R110 Nausea: Secondary | ICD-10-CM

## 2015-01-31 DIAGNOSIS — D509 Iron deficiency anemia, unspecified: Secondary | ICD-10-CM

## 2015-01-31 DIAGNOSIS — E079 Disorder of thyroid, unspecified: Secondary | ICD-10-CM

## 2015-01-31 DIAGNOSIS — J4521 Mild intermittent asthma with (acute) exacerbation: Secondary | ICD-10-CM

## 2015-01-31 LAB — GLUCOSE, CAPILLARY: Glucose-Capillary: 107 mg/dL — ABNORMAL HIGH (ref 65–99)

## 2015-01-31 NOTE — Patient Instructions (Addendum)
1. We will call you and let you know when to go for repeat CT scan of your chest.     2. Please take all medications as prescribed.    3. If you have worsening of your symptoms or new symptoms arise, please call the clinic ([phone]), or go to the ER immediately if symptoms are severe.  Please see Dr. Shirlee Latch (or one of his partners) this month.  Please come back to see me in 3 months or sooner if you have problems.  Call with any questions.

## 2015-01-31 NOTE — Progress Notes (Signed)
Subjective:    Patient ID: [name], female    DOB: 02/06/1926, 81 y.o.   MRN: [id number]  HPI Comments: Ms. Centofanti is an 81 year old woman with a PMH as below here for follow-up of chronic conditions.  Please see problem based charting for A&P.    Past Medical History  Diagnosis Date  . Hypertension   . Asthma   . Osteoarthritis     left shoulder  . Pseudogout   . Hypokalemia     a. Previously felt due to diuretics, req supplementation.  . Churg-Strauss syndrome     a. Sural nerve biopsy 02/2000.  Darene Lamer disease     a. s/p thyroidectomy.  . Glaucoma   . Hypercholesteremia   . Venous insufficiency   . Osteopenia     DEXA 2004  . Lipoma     left inner thigh  . Cataract     left eye  . VASCULITIS   . Chronic combined systolic and diastolic heart failure     a. 02/4943 EF:20-25%  . Pleural effusion, left     a. Thoracentesis 2001 - per notes, no malignant cells, was transudative.  Marland Kitchen NSVT (nonsustained ventricular tachycardia)     a. During CHF adm 2001.  . Multifocal atrial tachycardia     a. Documented as OP 01/2012.  Marland Kitchen Ectopic atrial tachycardia     a. Documented on tele as IP 02/2012.  . Pulmonary HTN     a. Mod by echo 02/2012.  . Valvular heart disease     a. Echo 02/2012: mod MR/TR, mild AI.  Marland Kitchen Pericardial effusion     a. Echo 02/2012: small-mod pericardial effusion.  . Atrial fibrillation, permanent     a. Dx 04/2013, on metoprolol, dig and xarelto  . Shortness of breath   . CHF (congestive heart failure)   . On home oxygen therapy     "wear it mostly at night" (09/20/2014)  . Pneumonia 1930's  . Type II diabetes mellitus    Current Outpatient Prescriptions on File Prior to Visit  Medication Sig Dispense Refill  . acetaminophen-codeine (TYLENOL #3) 300-30 MG per tablet Take 1 tablet by mouth every 6 (six) hours as needed for moderate pain or severe pain. 60 tablet 2  . albuterol (PROVENTIL HFA;VENTOLIN HFA) 108 (90 BASE) MCG/ACT inhaler Inhale 1-2 puffs  into the lungs every 6 (six) hours as needed for shortness of breath. 18 g 11  . brimonidine (ALPHAGAN P) 0.1 % SOLN Place 1 drop into the left eye every 12 (twelve) hours. 15 mL 1  . diclofenac sodium (VOLTAREN) 1 % GEL Apply 2 g topically 4 (four) times daily as needed. 100 g 0  . dorzolamide (TRUSOPT) 2 % ophthalmic solution Place 1 drop into the left eye 2 (two) times daily. 10 mL 3  . DULERA 100-5 MCG/ACT AERO INHALE 2 PUFFS BY MOUTH TWICE DAILY 13 g 3  . ferrous sulfate 325 (65 FE) MG tablet Take 1 tablet (325 mg total) by mouth daily. 30 tablet 3  . furosemide (LASIX) 80 MG tablet Take 1.5 tablets (120 mg total) by mouth 2 (two) times daily. Hold if weight is 150 lb or less 180 tablet 3  . glucose blood test strip Use as instructed 100 each 12  . lisinopril (PRINIVIL,ZESTRIL) 10 MG tablet Take 1 tablet (10 mg total) by mouth daily. 30 tablet 1  . metFORMIN (GLUCOPHAGE) 1000 MG tablet Take 1 tablet (1,000 mg total) by mouth 2 (  two) times daily with a meal. 60 tablet 3  . metolazone (ZAROXOLYN) 2.5 MG tablet Take 1 tablet (2.5 mg total) by mouth once a week. On Fridays 30 tablet 0  . metoprolol succinate (TOPROL-XL) 50 MG 24 hr tablet TAKE 2 TABLETS BY MOUTH EVERY MORNING AND 1 TABLET BY MOUTH EVERY EVENING 90 tablet 1  . potassium chloride SA (K-DUR,KLOR-CON) 20 MEQ tablet Take 2 tablets (40 mEq total) by mouth daily. Only take this medication on days you take Lasix. 60 tablet 0  . ranitidine (ZANTAC) 75 MG tablet Take 1 tablet (75 mg total) by mouth 2 (two) times daily. 30 tablet 1  . spironolactone (ALDACTONE) 25 MG tablet TAKE 1 TABLET BY MOUTH EVERY DAY 30 tablet 3  . XARELTO 20 MG TABS tablet TAKE 1 TABLET BY MOUTH DAILY WITH DINNER 30 tablet 3   No current facility-administered medications on file prior to visit.     Review of Systems  Constitutional: Negative for fever, chills, appetite change and unexpected weight change.  HENT: Negative for hearing loss.   Eyes: Negative for  visual disturbance.  Respiratory: Negative for cough and shortness of breath.   Cardiovascular: Negative for chest pain, palpitations and leg swelling.  Gastrointestinal: Positive for constipation. Negative for nausea, vomiting, abdominal pain, diarrhea and blood in stool.       Occasional constipation that responds to prune juice.   Endocrine: Negative for polydipsia and polyuria.  Genitourinary: Negative for dysuria, hematuria and difficulty urinating.  Neurological: Negative for syncope, light-headedness and headaches.       Filed Vitals:   01/31/15 1449  BP: 125/64  Pulse: 70  Temp: 98 F (36.7 C)  TempSrc: Oral  Height:  (1.702 m)  Weight: 147 lb 3.2 oz (66.769 kg)  SpO2: 100%    Objective:   Physical Exam  Constitutional: She is oriented to person, place, and time. She appears well-developed. No distress.  HENT:  Head: Normocephalic and atraumatic.  Mouth/Throat: Oropharynx is clear and moist. No oropharyngeal exudate.  Eyes: EOM are normal.  Neck: Neck supple.  Cardiovascular: Normal rate and normal heart sounds.  Exam reveals no gallop and no friction rub.   No murmur heard. Irregularly irregular  Pulmonary/Chest: Effort normal and breath sounds normal. No respiratory distress. She has no wheezes. She has no rales.  Abdominal: Soft. Bowel sounds are normal. She exhibits no distension. There is no tenderness. There is no rebound.  Musculoskeletal: Normal range of motion. She exhibits no edema or tenderness.  Neurological: She is alert and oriented to person, place, and time. No cranial nerve deficit.  Skin: Skin is warm. She is not diaphoretic.  Psychiatric: She has a normal mood and affect. Her behavior is normal. Judgment and thought content normal.  Vitals reviewed.         Assessment & Plan:  Please see problem based charting for A&P.

## 2015-02-01 DIAGNOSIS — R911 Solitary pulmonary nodule: Secondary | ICD-10-CM | POA: Insufficient documentation

## 2015-02-01 NOTE — Assessment & Plan Note (Signed)
Lab Results  Component Value Date   HGBA1C 6.6 12/08/2014   HGBA1C 7.6 09/05/2014   HGBA1C 7.9* 04/05/2014     Assessment: Diabetes control:  well controlled Progress toward A1C goal:   at goal Comments: Compliant with metformin.  Cr stable at 0.73.  Plan: Medications:  continue current medications:  Metformin 1000mg  BID. Home glucose monitoring:  none Frequency:   Timing:   Instruction/counseling given: reminded to get eye exam Educational resources provided: brochure (denies) Self management tools provided:   Other plans: Complete foot exam and check A1c next visit.  RTC in 3 months.

## 2015-02-01 NOTE — Assessment & Plan Note (Signed)
Asymptomatic.  She is not requiring rescue inhaler.  Compliant with Dulera.

## 2015-02-01 NOTE — Assessment & Plan Note (Signed)
Rate controlled.  Continue Toprol XL and Xarelto 20mg  daily.

## 2015-02-01 NOTE — Assessment & Plan Note (Signed)
Unable to palpate today.  Last TSH normal.  Recent CT chest notes large left thyroid goiter noted with multiple calcified lesions. There is mass effect on the trachea and esophagus. This appears stable. She denies dyspnea or dysphagia.  Unclear if she ever followed-up with endocrine.  Will attempt to get records (if she went).  Otherwise, re-refer.

## 2015-02-01 NOTE — Assessment & Plan Note (Signed)
Resolved

## 2015-02-01 NOTE — Assessment & Plan Note (Addendum)
She has no smoking hx but her age, weight loss in the past year (now stable) and hemoptysis two months ago are concerning for possible malignancy.  Can repeat CT (Fleischner guidelines) or get PET.  CT surveillance would be reasonable given her significant co-morbidities, however PET would be more accurate at differentiating benign from malignant.  Will discuss options with patient.

## 2015-02-01 NOTE — Assessment & Plan Note (Signed)
Asymptomatic.  Advised she can stop using Zantac if not needed.

## 2015-02-01 NOTE — Assessment & Plan Note (Signed)
Compliant with Fe supplement.  Check CBC next visit.

## 2015-02-01 NOTE — Assessment & Plan Note (Signed)
Asymptomatic, no LE edema and weight is stable.  She was seen by Dr. Shirlee Latch (HF physician) two months ago.  Will continue current therapy as below and arrange follow-up with HF.  She will continue daily weights with prn Lasix. - continue Lasix 120mg  BID PRN for weight > 150 pounds (she is currently 147 pounds and has not needed Lasix recently); take K supplement only on days she takes Lasix - continue lisinopril 10mg  daily, Zaroxolyn 2.5mg  qweekly (she says she takes this on Friday), Toprol XL 100mg  qAM and 50mg  qPM, spironolactone 25mg  daily

## 2015-02-01 NOTE — Assessment & Plan Note (Signed)
BP Readings from Last 3 Encounters:  01/31/15 125/64  12/08/14 164/73  12/01/14 120/70    Lab Results  Component Value Date   NA 138 11/24/2014   K 3.8 11/24/2014   CREATININE 0.73 11/24/2014    Assessment: Blood pressure control:  well controlled Progress toward BP goal:   at goal Comments: Compliant with lisinopril 10mg  daily, Toprol XL 100mg  qAM and 50mg  qPM, spironolactone 25mg  daily   Plan: Medications:  continue current medications: lisinopril 10mg  daily, Toprol XL 100mg  qAM and 50mg  qPM, spironolactone 25mg  daily  Educational resources provided: brochure (denies) Other plans: BMP next visit. RTC in 3 months for follow-up.

## 2015-02-05 ENCOUNTER — Telehealth: Payer: Self-pay | Admitting: *Deleted

## 2015-02-05 NOTE — Telephone Encounter (Signed)
Call from patient's daughter about 2 meds  That Dr. Andrey Campanile needed to know from daughter if patient was taking the meds discussed.  Patient is currently taking the 2 meds that were asked about.  Patient also has been scheduled for the [facility] on 02/27/2015 at 2:20 PM.  Scheduler also that patient has no showed for a couple appointments and has also shown up late.  Patient's daughter was informed of this problem and said that she let her brother know.  Will also tell patient's son and transporter to come earlier for the appointment.  Angelina Ok, RN 02/05/2015 11:32 AM

## 2015-02-06 NOTE — Progress Notes (Signed)
Internal Medicine Clinic Attending  Case discussed with Dr. Wilson soon after the resident saw the patient.  We reviewed the resident's history and exam and pertinent patient test results.  I agree with the assessment, diagnosis, and plan of care documented in the resident's note.  

## 2015-02-13 ENCOUNTER — Other Ambulatory Visit: Payer: Self-pay | Admitting: Internal Medicine

## 2015-02-13 ENCOUNTER — Telehealth: Payer: Self-pay | Admitting: Internal Medicine

## 2015-02-13 DIAGNOSIS — R911 Solitary pulmonary nodule: Secondary | ICD-10-CM

## 2015-02-13 NOTE — Assessment & Plan Note (Addendum)
I spoke with patient's daughter regarding repeat CT vs PET for evaluation of the lesion.  I explained PET vs CT and that PET would likely give more information but may lead to further diagnostics/therapies that may be difficult given her mother's co-morbidities. She says her mother is older and is not going to want to be cut.  I told her biopsy does not always require surgery and sometimes transbronchial is an option.  She would like to get CT for now and if needed would be open to PET in the future.  She will discuss with her mom.  I will place order for CT in July to assess for growth vs stability or any new characteristics.  Patient to follow-up after CT.

## 2015-02-13 NOTE — Telephone Encounter (Signed)
I spoke with Ms. Kauk's daughter, [name] regarding choice between repeat CT vs PET scan for lung nodule.  She opts for CT for now and PET later if needed.  Please see problem based charting for details.

## 2015-02-24 DIAGNOSIS — I509 Heart failure, unspecified: Secondary | ICD-10-CM | POA: Diagnosis not present

## 2015-02-27 ENCOUNTER — Encounter (HOSPITAL_COMMUNITY): Payer: Medicare Other

## 2015-03-11 ENCOUNTER — Other Ambulatory Visit: Payer: Self-pay | Admitting: Internal Medicine

## 2015-03-12 ENCOUNTER — Other Ambulatory Visit: Payer: Self-pay | Admitting: Internal Medicine

## 2015-03-19 ENCOUNTER — Ambulatory Visit (HOSPITAL_COMMUNITY)
Admission: RE | Admit: 2015-03-19 | Discharge: 2015-03-19 | Disposition: A | Discharge status: Home / Self Care | Payer: Medicare Other | Source: Ambulatory Visit | Attending: Cardiology | Admitting: Cardiology

## 2015-03-19 ENCOUNTER — Encounter (HOSPITAL_COMMUNITY): Payer: Self-pay

## 2015-03-19 VITALS — BP 131/71 | HR 90 | Resp 22 | Wt 141.2 lb

## 2015-03-19 DIAGNOSIS — Z79899 Other long term (current) drug therapy: Secondary | ICD-10-CM | POA: Diagnosis not present

## 2015-03-19 DIAGNOSIS — I429 Cardiomyopathy, unspecified: Secondary | ICD-10-CM | POA: Insufficient documentation

## 2015-03-19 DIAGNOSIS — M79605 Pain in left leg: Secondary | ICD-10-CM | POA: Diagnosis not present

## 2015-03-19 DIAGNOSIS — Z7902 Long term (current) use of antithrombotics/antiplatelets: Secondary | ICD-10-CM | POA: Diagnosis not present

## 2015-03-19 DIAGNOSIS — Z9981 Dependence on supplemental oxygen: Secondary | ICD-10-CM | POA: Diagnosis not present

## 2015-03-19 DIAGNOSIS — E780 Pure hypercholesterolemia: Secondary | ICD-10-CM | POA: Insufficient documentation

## 2015-03-19 DIAGNOSIS — I10 Essential (primary) hypertension: Secondary | ICD-10-CM | POA: Diagnosis not present

## 2015-03-19 DIAGNOSIS — Z833 Family history of diabetes mellitus: Secondary | ICD-10-CM | POA: Insufficient documentation

## 2015-03-19 DIAGNOSIS — I272 Other secondary pulmonary hypertension: Secondary | ICD-10-CM | POA: Insufficient documentation

## 2015-03-19 DIAGNOSIS — R911 Solitary pulmonary nodule: Secondary | ICD-10-CM | POA: Diagnosis not present

## 2015-03-19 DIAGNOSIS — I5022 Chronic systolic (congestive) heart failure: Secondary | ICD-10-CM | POA: Diagnosis not present

## 2015-03-19 DIAGNOSIS — I482 Chronic atrial fibrillation, unspecified: Secondary | ICD-10-CM

## 2015-03-19 DIAGNOSIS — M79606 Pain in leg, unspecified: Secondary | ICD-10-CM

## 2015-03-19 DIAGNOSIS — E119 Type 2 diabetes mellitus without complications: Secondary | ICD-10-CM | POA: Diagnosis not present

## 2015-03-19 DIAGNOSIS — Z86711 Personal history of pulmonary embolism: Secondary | ICD-10-CM | POA: Diagnosis not present

## 2015-03-19 DIAGNOSIS — I5042 Chronic combined systolic (congestive) and diastolic (congestive) heart failure: Secondary | ICD-10-CM

## 2015-03-19 DIAGNOSIS — J45909 Unspecified asthma, uncomplicated: Secondary | ICD-10-CM | POA: Diagnosis not present

## 2015-03-19 LAB — BRAIN NATRIURETIC PEPTIDE: B NATRIURETIC PEPTIDE 5: 423.8 pg/mL — AB (ref 0.0–100.0)

## 2015-03-19 LAB — BASIC METABOLIC PANEL
Anion gap: 8 (ref 5–15)
BUN: 17 mg/dL (ref 6–20)
CALCIUM: 9.4 mg/dL (ref 8.9–10.3)
CO2: 27 mmol/L (ref 22–32)
Chloride: 101 mmol/L (ref 101–111)
Creatinine, Ser: 0.66 mg/dL (ref 0.44–1.00)
GFR calc non Af Amer: >60 mL/min (ref >60)
Glucose, Bld: 129 mg/dL — ABNORMAL HIGH (ref 65–99)
Potassium: 3.7 mmol/L (ref 3.5–5.1)
Sodium: 136 mmol/L (ref 135–145)

## 2015-03-19 LAB — CBC
HEMATOCRIT: 41 % (ref 36.0–46.0)
Hemoglobin: 13.9 g/dL (ref 12.0–15.0)
MCH: 31.9 pg (ref 26.0–34.0)
MCHC: 33.9 g/dL (ref 30.0–36.0)
MCV: 94 fL (ref 78.0–100.0)
Platelets: 248 10*3/uL (ref 150–400)
RBC: 4.36 MIL/uL (ref 3.87–5.11)
RDW: 13 % (ref 11.5–15.5)
WBC: 4.5 10*3/uL (ref 4.0–10.5)

## 2015-03-19 NOTE — Patient Instructions (Signed)
Routine lab work today. Will notify you of abnormal results, otherwise no news is good news!  Will schedule you for lower extremity arterial dopplers to evaluate leg pain at [facility]. [facility] [address], [address] [place], [place] [address] [phone]  Will schedule you for CT lungs without contrast to evaluate right upper lobe nodule.  Follow up 4 months.  Do the following things EVERYDAY: 1) Weigh yourself in the morning before breakfast. Write it down and keep it in a log. 2) Take your medicines as prescribed 3) Eat low salt foods-Limit salt (sodium) to 2000 mg per day.  4) Stay as active as you can everyday 5) Limit all fluids for the day to less than 2 liters

## 2015-03-20 NOTE — Progress Notes (Signed)
Patient ID: [name], female   DOB: 08-12-1926, 81 y.o.   MRN: [id number] PCP: Dr Evelena Peat  HPI: [name] is an 81 year old with a history of HTN, DM2, Asthma, Churg-Strauss syndrome,chronic atrial fibrillation on xarelto, PE, night time oxygen,and chronic systolic HF EF 20-25% (nonischemic cardiomyopathy). She uses home oxygen with exertion and at night.   Admitted in early February 2016 with hypoxia and volume overload. Diuresed with IV lasix and transitioned to po lasix 120 mg twice a day + metolazone every Friday. Discharge weight was 165 pounds. AHC to follow.   She had an episode of significant hemoptysis on 4/8 and went to ER.  They could not get an IV so CT was not done.  She was continued on Xarelto and has had no further hemoptysis.    She walks with a walker around the house without dyspnea.  No chest pain.  No lightheadedness, orthopnea, or PND.  She felll once recently (tripped).  No Lasix for 2 months and weight is stable. No BRBPR or melena.  Legs are weak and left leg hurts (not able to characterize this any further).    Labs (4/16): K 3.8, creatinine 0.73, BNP 993, HCT 36.6  ROS: All systems negative except as listed in HPI, PMH and Problem List.  SH:  History   Social History  . Marital Status: Widowed    Spouse Name: N/A  . Number of Children: N/A  . Years of Education: N/A   Occupational History  . Retired    Social History Main Topics  . Smoking status: Never Smoker   . Smokeless tobacco: Never Used  . Alcohol Use: No  . Drug Use: No  . Sexual Activity: Not on file   Other Topics Concern  . Not on file   Social History Narrative   Lives in house, with family just next door.    Never smoker, no alcohol.      Used to work at [employer].    Before that, worked in food prep.                    FH:  Family History  Problem Relation Age of Onset  . Diabetes Mother   . Diabetes Father   . Heart disease Neg Hx     Past  Medical History  Diagnosis Date  . Hypertension   . Asthma   . Osteoarthritis     left shoulder  . Pseudogout   . Hypokalemia     a. Previously felt due to diuretics, req supplementation.  . Churg-Strauss syndrome     a. Sural nerve biopsy 02/2000.  Darene Lamer disease     a. s/p thyroidectomy.  . Glaucoma   . Hypercholesteremia   . Venous insufficiency   . Osteopenia     DEXA 2004  . Lipoma     left inner thigh  . Cataract     left eye  . VASCULITIS   . Chronic combined systolic and diastolic heart failure     a. 11/979 EF:20-25%  . Pleural effusion, left     a. Thoracentesis 2001 - per notes, no malignant cells, was transudative.  Marland Kitchen NSVT (nonsustained ventricular tachycardia)     a. During CHF adm 2001.  . Multifocal atrial tachycardia     a. Documented as OP 01/2012.  Marland Kitchen Ectopic atrial tachycardia     a. Documented on tele as IP 02/2012.  . Pulmonary HTN  a. Mod by echo 02/2012.  . Valvular heart disease     a. Echo 02/2012: mod MR/TR, mild AI.  Marland Kitchen Pericardial effusion     a. Echo 02/2012: small-mod pericardial effusion.  . Atrial fibrillation, permanent     a. Dx 04/2013, on metoprolol, dig and xarelto  . Shortness of breath   . CHF (congestive heart failure)   . On home oxygen therapy     "wear it mostly at night" (09/20/2014)  . Pneumonia 1930's  . Type II diabetes mellitus     Current Outpatient Prescriptions  Medication Sig Dispense Refill  . brimonidine (ALPHAGAN P) 0.1 % SOLN Place 1 drop into the left eye every 12 (twelve) hours. 15 mL 1  . dorzolamide (TRUSOPT) 2 % ophthalmic solution Place 1 drop into the left eye 2 (two) times daily. 10 mL 3  . DULERA 100-5 MCG/ACT AERO INHALE 2 PUFFS BY MOUTH TWICE DAILY 13 g 3  . ferrous sulfate 325 (65 FE) MG tablet Take 1 tablet (325 mg total) by mouth daily. 30 tablet 3  . furosemide (LASIX) 80 MG tablet Take 80 mg by mouth. Take 1 tab if weight 151 lb or greater    . glucose blood test strip Use as instructed 100 each  12  . lisinopril (PRINIVIL,ZESTRIL) 10 MG tablet Take 1 tablet (10 mg total) by mouth daily. 30 tablet 1  . metFORMIN (GLUCOPHAGE) 1000 MG tablet Take 1 tablet (1,000 mg total) by mouth 2 (two) times daily with a meal. 60 tablet 3  . metoprolol succinate (TOPROL-XL) 50 MG 24 hr tablet TAKE 2 TABLETS BY MOUTH EVERY MORNING AND 1 TABLET BY MOUTH EVERY EVENING 90 tablet 3  . potassium chloride SA (K-DUR,KLOR-CON) 20 MEQ tablet Take 2 tablets (40 mEq total) by mouth daily. Only take this medication on days you take Lasix. 60 tablet 0  . ranitidine (ZANTAC) 75 MG tablet Take 1 tablet (75 mg total) by mouth 2 (two) times daily. 30 tablet 1  . spironolactone (ALDACTONE) 25 MG tablet TAKE 1 TABLET BY MOUTH EVERY DAY 30 tablet 3  . XARELTO 20 MG TABS tablet TAKE 1 TABLET BY MOUTH DAILY WITH DINNER 30 tablet 3   No current facility-administered medications for this encounter.    Filed Vitals:   03/19/15 1440  BP: 131/71  Pulse: 90  Resp: 22  Weight: 141 lb 4 oz (64.071 kg)  SpO2: 95%   Physical Exam:  General: Elderly Sitting in the chair. NAD HEENT: normal  Neck: supple. JVP 6-7.  Carotids 2+ bilat; no bruits. No lymphadenopathy or thryomegaly appreciated.  Cor: PMI nondisplaced. Irregular rate & rhythm. 2/6 MR/TR apex/LLSB.  I am unable to palpate PT pulses bilaterally.  Lungs: dimished but clear. On 2 liters nasal cannula.  Abdomen: soft, nontender, nondistended. No hepatosplenomegaly. No bruits or masses. Good bowel sounds.  Extremities: no cyanosis, clubbing, rash.  Trace ankle edema.  Neuro: alert & orientedx3, cranial nerves grossly intact. moves all 4 extremities w/o difficulty. Affect pleasant  ASSESSMENT & PLAN: 1. Chronic systolic CHF: Nonischemic cardiomyopathy.  Echo 09/21/2014 EF 20-25% with moderate to severe RV dysfunction.  NYHA III.  Weight stable without Lasix use.   - Continue current lisinopril and Toprol XL.  - Lasix prn weight gain.  - BMET/BNP today.  2. Chronic  atrial fibrillation:  CHADSVASC = 6. Rate controlled on Toprol XL 100 mg in am and 50 mg in pm. On Xarelto 20 mg daily. Will get CBC today.  3. H/O PE: On xarelto 20 mg daily  4. Lung nodule: RUL.  Needs followup noncontrast chest CT, will make sure this has been scheduled.  5. Leg pain: Mainly on left.  Legs also weak and "give out."  I cannot palpate PT pulses. I will get ABIs to assess for PAD.   Followup in 1 month.   Marca Ancona   03/20/2015

## 2015-03-21 ENCOUNTER — Other Ambulatory Visit: Payer: Self-pay | Admitting: Internal Medicine

## 2015-03-23 ENCOUNTER — Other Ambulatory Visit: Payer: Self-pay | Admitting: Internal Medicine

## 2015-03-27 DIAGNOSIS — I509 Heart failure, unspecified: Secondary | ICD-10-CM | POA: Diagnosis not present

## 2015-03-29 ENCOUNTER — Other Ambulatory Visit: Payer: Self-pay | Admitting: Internal Medicine

## 2015-04-02 NOTE — Telephone Encounter (Signed)
?   pharmacy

## 2015-04-04 ENCOUNTER — Telehealth (HOSPITAL_COMMUNITY): Payer: Self-pay | Admitting: Cardiology

## 2015-04-04 NOTE — Telephone Encounter (Signed)
Patient scheduled for CT chest (non contrast) per Dr.McLean on 04/13/15 Cpt code 28315 With patients current [facility] Pre cert# [id number] Valid until 05/07/15

## 2015-04-05 ENCOUNTER — Other Ambulatory Visit: Payer: Self-pay | Admitting: Internal Medicine

## 2015-04-05 DIAGNOSIS — R110 Nausea: Secondary | ICD-10-CM

## 2015-04-09 ENCOUNTER — Ambulatory Visit (HOSPITAL_COMMUNITY)
Admission: RE | Admit: 2015-04-09 | Discharge: 2015-04-09 | Disposition: A | Discharge status: Home / Self Care | Payer: Medicare Other | Source: Ambulatory Visit | Attending: Cardiology | Admitting: Cardiology

## 2015-04-09 ENCOUNTER — Other Ambulatory Visit: Payer: Self-pay | Admitting: Cardiology

## 2015-04-09 DIAGNOSIS — I10 Essential (primary) hypertension: Secondary | ICD-10-CM | POA: Diagnosis not present

## 2015-04-09 DIAGNOSIS — R0989 Other specified symptoms and signs involving the circulatory and respiratory systems: Secondary | ICD-10-CM

## 2015-04-09 DIAGNOSIS — I70203 Unspecified atherosclerosis of native arteries of extremities, bilateral legs: Secondary | ICD-10-CM | POA: Insufficient documentation

## 2015-04-09 DIAGNOSIS — M79606 Pain in leg, unspecified: Secondary | ICD-10-CM | POA: Insufficient documentation

## 2015-04-09 DIAGNOSIS — Z7902 Long term (current) use of antithrombotics/antiplatelets: Secondary | ICD-10-CM | POA: Insufficient documentation

## 2015-04-09 DIAGNOSIS — E119 Type 2 diabetes mellitus without complications: Secondary | ICD-10-CM | POA: Diagnosis not present

## 2015-04-09 DIAGNOSIS — Z86711 Personal history of pulmonary embolism: Secondary | ICD-10-CM | POA: Diagnosis not present

## 2015-04-12 ENCOUNTER — Other Ambulatory Visit: Payer: Self-pay | Admitting: Internal Medicine

## 2015-04-13 ENCOUNTER — Ambulatory Visit (HOSPITAL_COMMUNITY): Payer: Medicare Other | Attending: Cardiology

## 2015-04-26 ENCOUNTER — Other Ambulatory Visit: Payer: Self-pay | Admitting: Internal Medicine

## 2015-04-27 DIAGNOSIS — I509 Heart failure, unspecified: Secondary | ICD-10-CM | POA: Diagnosis not present

## 2015-05-01 ENCOUNTER — Other Ambulatory Visit: Payer: Self-pay | Admitting: Oncology

## 2015-05-02 ENCOUNTER — Other Ambulatory Visit: Payer: Self-pay | Admitting: Internal Medicine

## 2015-05-02 DIAGNOSIS — H409 Unspecified glaucoma: Secondary | ICD-10-CM

## 2015-05-02 NOTE — Telephone Encounter (Signed)
Pt called requesting eye drop and tylenol 3 to be filled.

## 2015-05-03 ENCOUNTER — Ambulatory Visit (HOSPITAL_COMMUNITY): Payer: Medicare Other

## 2015-05-03 ENCOUNTER — Ambulatory Visit (HOSPITAL_COMMUNITY)
Admission: RE | Admit: 2015-05-03 | Discharge: 2015-05-03 | Disposition: A | Discharge status: Home / Self Care | Payer: Medicare Other | Source: Ambulatory Visit | Attending: Cardiology | Admitting: Cardiology

## 2015-05-03 DIAGNOSIS — R911 Solitary pulmonary nodule: Secondary | ICD-10-CM

## 2015-05-03 NOTE — Addendum Note (Signed)
Addended by: Maura Crandall on: 05/03/2015 02:57 PM   Modules accepted: Orders

## 2015-05-06 MED ORDER — DORZOLAMIDE HCL 2 % OP SOLN: 1.0000 [drp] | Freq: Two times a day (BID) | OPHTHALMIC | Status: DC

## 2015-05-08 ENCOUNTER — Telehealth: Payer: Self-pay | Admitting: *Deleted

## 2015-05-08 ENCOUNTER — Other Ambulatory Visit: Payer: Self-pay | Admitting: Internal Medicine

## 2015-05-08 ENCOUNTER — Other Ambulatory Visit: Payer: Self-pay | Admitting: *Deleted

## 2015-05-08 ENCOUNTER — Ambulatory Visit (HOSPITAL_COMMUNITY): Payer: Medicare Other

## 2015-05-08 MED ORDER — BRIMONIDINE TARTRATE 0.1 % OP SOLN: 1.0000 [drp] | Freq: Two times a day (BID) | OPHTHALMIC | Status: DC

## 2015-05-08 MED ORDER — ACETAMINOPHEN-CODEINE #3 300-30 MG PO TABS: 1.0000 | ORAL_TABLET | Freq: Three times a day (TID) | ORAL | Status: DC | PRN

## 2015-05-08 NOTE — Telephone Encounter (Signed)
Have attempted to call pt's daughter twice in last few days, lm, told her to ask for helen

## 2015-05-08 NOTE — Telephone Encounter (Signed)
Tylenol #3 Rx Feb Dr Reece Agar bc insurance would not pay for voltarin for leg pain. Got 60 with 2 RF. Reviewed ABI - no intervention unless critical limb ischemia. Has Oct appt to discuss need for tyleno l#3.  Eye drops are for glucoma. We have been rx'ing but I ask that she F/U optho and will fil one but no FRF to give her time to get into optho.

## 2015-05-08 NOTE — Telephone Encounter (Signed)
Please fill the tylenol#3, at least until her appt Pain is in her legs, she cries with the pain Daughter states the vascular md told her there is nothing they can do except make her comfortable due to the vascular deficiency  i have made her an appt for your first available Daughter wants you to call her ASAP at [phone], she is not happy, states if you dont have time for her mother maybe they need another md. Daughter's husband is in the hospital at the moment, i think she is stressed out to the max

## 2015-05-08 NOTE — Telephone Encounter (Signed)
Pt daughter called requesting alphagan and tylenol 3 to be filled. Please call pt daughter @ [phone], she will be available after 12.

## 2015-05-08 NOTE — Telephone Encounter (Signed)
Pt's daughter wants to know what type of transportation services are available for mother also if there are other services that pt may qualify for that could help in her care, please call her after 1215 because she works until then

## 2015-05-08 NOTE — Telephone Encounter (Signed)
Called to pharm 

## 2015-05-10 ENCOUNTER — Telehealth: Payer: Self-pay | Admitting: Licensed Clinical Social Worker

## 2015-05-10 ENCOUNTER — Other Ambulatory Visit: Payer: Self-pay | Admitting: Internal Medicine

## 2015-05-10 NOTE — Telephone Encounter (Signed)
Please see 9/20 [facility] nurse note for initial contact

## 2015-05-10 NOTE — Telephone Encounter (Signed)
Please see CSW telephone note for follow up.

## 2015-05-10 NOTE — Telephone Encounter (Signed)
On indef for A Fib. Has OCt appt PCP

## 2015-05-10 NOTE — Telephone Encounter (Signed)
CSW placed called to pt.  CSW left message requesting return call. CSW provided contact hours and phone number. 

## 2015-05-11 ENCOUNTER — Encounter: Payer: Self-pay | Admitting: Licensed Clinical Social Worker

## 2015-05-11 NOTE — Telephone Encounter (Signed)
Daughter, Sherrilee Gilles also had questions regarding her spouse who was paralyzed this year due to an accident.  CSW provided brief information.

## 2015-05-11 NOTE — Telephone Encounter (Signed)
CSW returned call to [name] to provide information on resources and benefits for [name].  [name] lives with daughter, [name].  Daughter would like to know services available under insurance and transportation options.  Daughter was at work, so CSW provided brief information and additional information mailed, address confirmed..  Letter mailed.

## 2015-05-12 ENCOUNTER — Other Ambulatory Visit: Payer: Self-pay | Admitting: Internal Medicine

## 2015-05-15 ENCOUNTER — Other Ambulatory Visit: Payer: Self-pay | Admitting: Internal Medicine

## 2015-05-23 ENCOUNTER — Other Ambulatory Visit: Payer: Self-pay | Admitting: *Deleted

## 2015-05-23 DIAGNOSIS — E119 Type 2 diabetes mellitus without complications: Secondary | ICD-10-CM

## 2015-05-23 MED ORDER — METFORMIN HCL 1000 MG PO TABS: 1000.0000 mg | ORAL_TABLET | Freq: Two times a day (BID) | ORAL | Status: DC

## 2015-05-26 ENCOUNTER — Other Ambulatory Visit: Payer: Self-pay | Admitting: Internal Medicine

## 2015-05-27 DIAGNOSIS — I509 Heart failure, unspecified: Secondary | ICD-10-CM | POA: Diagnosis not present

## 2015-05-28 ENCOUNTER — Ambulatory Visit (INDEPENDENT_AMBULATORY_CARE_PROVIDER_SITE_OTHER): Payer: Medicare Other | Admitting: Internal Medicine

## 2015-05-28 ENCOUNTER — Encounter: Payer: Self-pay | Admitting: Internal Medicine

## 2015-05-28 VITALS — BP 148/106 | HR 120 | Temp 97.6°F | Wt 150.2 lb

## 2015-05-28 DIAGNOSIS — E119 Type 2 diabetes mellitus without complications: Secondary | ICD-10-CM | POA: Diagnosis not present

## 2015-05-28 DIAGNOSIS — K219 Gastro-esophageal reflux disease without esophagitis: Secondary | ICD-10-CM

## 2015-05-28 DIAGNOSIS — M79605 Pain in left leg: Secondary | ICD-10-CM | POA: Diagnosis not present

## 2015-05-28 DIAGNOSIS — R110 Nausea: Secondary | ICD-10-CM

## 2015-05-28 LAB — GLUCOSE, CAPILLARY: Glucose-Capillary: 174 mg/dL — ABNORMAL HIGH (ref 65–99)

## 2015-05-28 LAB — POCT GLYCOSYLATED HEMOGLOBIN (HGB A1C): Hemoglobin A1C: 7.4

## 2015-05-28 MED ORDER — ACETAMINOPHEN-CODEINE #3 300-30 MG PO TABS: 1.0000 | ORAL_TABLET | Freq: Three times a day (TID) | ORAL | Status: DC | PRN

## 2015-05-28 MED ORDER — RANITIDINE HCL 75 MG PO TABS: 75.0000 mg | ORAL_TABLET | Freq: Two times a day (BID) | ORAL | Status: DC

## 2015-05-28 NOTE — Assessment & Plan Note (Signed)
Patient requesting refill on her acid reflux medication. Denies having any nausea at present.  -Ranitidine refilled

## 2015-05-28 NOTE — Assessment & Plan Note (Signed)
A1c ordered today. -Discuss diabetes at next visit in 2 weeks.

## 2015-05-28 NOTE — Progress Notes (Signed)
Patient ID: [name], female   DOB: 1926-08-11, 81 y.o.   MRN: [id number]   Subjective:   Patient ID: [name] female   DOB: 1925/09/19 81 y.o.   MRN: [id number]  HPI: Ms.Aviannah Wormley is a 81 y.o. F with a PMHx of conditions listed below presenting to the clinic with a complaint of pain in her L lower leg that started several months ago. Patient states the pain is present at rest. She was accompanied by her daughter who stated that the patient does not ambulate much. Patient denies any CP or SOB. Denies noticing any skin changes or hair loss on her legs.     Past Medical History  Diagnosis Date  . Hypertension   . Asthma   . Osteoarthritis     left shoulder  . Pseudogout   . Hypokalemia     a. Previously felt due to diuretics, req supplementation.  . Churg-Strauss syndrome (HCC)     a. Sural nerve biopsy 02/2000.  Darene Lamer disease     a. s/p thyroidectomy.  . Glaucoma   . Hypercholesteremia   . Venous insufficiency   . Osteopenia     DEXA 2004  . Lipoma     left inner thigh  . Cataract     left eye  . VASCULITIS   . Chronic combined systolic and diastolic heart failure (HCC)     a. 02/2012 EF:20-25%  . Pleural effusion, left     a. Thoracentesis 2001 - per notes, no malignant cells, was transudative.  Marland Kitchen NSVT (nonsustained ventricular tachycardia) (HCC)     a. During CHF adm 2001.  . Multifocal atrial tachycardia (HCC)     a. Documented as OP 01/2012.  Marland Kitchen Ectopic atrial tachycardia (HCC)     a. Documented on tele as IP 02/2012.  . Pulmonary HTN (HCC)     a. Mod by echo 02/2012.  . Valvular heart disease     a. Echo 02/2012: mod MR/TR, mild AI.  Marland Kitchen Pericardial effusion     a. Echo 02/2012: small-mod pericardial effusion.  . Atrial fibrillation, permanent (HCC)     a. Dx 04/2013, on metoprolol, dig and xarelto  . Shortness of breath   . CHF (congestive heart failure) (HCC)   . On home oxygen therapy     "wear it mostly at night" (09/20/2014)  . Pneumonia 1930's  . Type  II diabetes mellitus (HCC)    Current Outpatient Prescriptions  Medication Sig Dispense Refill  . acetaminophen-codeine (TYLENOL #3) 300-30 MG tablet Take 1-2 tablets by mouth every 8 (eight) hours as needed (for pain). 45 tablet 1  . brimonidine (ALPHAGAN P) 0.1 % SOLN Place 1 drop into the left eye every 12 (twelve) hours. 45 mL 0  . dorzolamide (TRUSOPT) 2 % ophthalmic solution Place 1 drop into the left eye 2 (two) times daily. 10 mL 3  . DULERA 100-5 MCG/ACT AERO INHALE 2 PUFFS BY MOUTH TWICE DAILY 39 g 1  . ferrous sulfate 325 (65 FE) MG tablet TAKE 1 TABLET(325 MG) BY MOUTH DAILY 30 tablet 3  . furosemide (LASIX) 80 MG tablet Take 80 mg by mouth. Take 1 tab if weight 151 lb or greater    . glucose blood test strip Use as instructed 100 each 12  . lisinopril (PRINIVIL,ZESTRIL) 10 MG tablet Take 1 tablet (10 mg total) by mouth daily. 30 tablet 1  . lisinopril (PRINIVIL,ZESTRIL) 10 MG tablet TAKE 1 TABLET(10 MG) BY MOUTH DAILY 30 tablet 3  .  metFORMIN (GLUCOPHAGE) 1000 MG tablet Take 1 tablet (1,000 mg total) by mouth 2 (two) times daily with a meal. 60 tablet 3  . metoprolol succinate (TOPROL-XL) 50 MG 24 hr tablet TAKE 2 TABLETS BY MOUTH EVERY MORNING AND 1 TABLET BY MOUTH EVERY EVENING 90 tablet 3  . potassium chloride SA (K-DUR,KLOR-CON) 20 MEQ tablet Take 2 tablets (40 mEq total) by mouth daily. Only take this medication on days you take Lasix. 60 tablet 0  . ranitidine (ZANTAC) 75 MG tablet Take 1 tablet (75 mg total) by mouth 2 (two) times daily. 60 tablet 0  . spironolactone (ALDACTONE) 25 MG tablet TAKE 1 TABLET BY MOUTH EVERY DAY 30 tablet 3  . XARELTO 20 MG TABS tablet TAKE 1 TABLET BY MOUTH DAILY WITH DINNER 30 tablet 11   No current facility-administered medications for this visit.   Family History  Problem Relation Age of Onset  . Diabetes Mother   . Diabetes Father   . Heart disease Neg Hx    Social History   Social History  . Marital Status: Widowed    Spouse Name:  N/A  . Number of Children: N/A  . Years of Education: N/A   Occupational History  . Retired    Social History Main Topics  . Smoking status: Never Smoker   . Smokeless tobacco: Never Used  . Alcohol Use: No  . Drug Use: No  . Sexual Activity: Not Asked   Other Topics Concern  . None   Social History Narrative   Lives in house, with family just next door.    Never smoker, no alcohol.      Used to work at [employer].    Before that, worked in food prep.                   Review of Systems: Review of Systems  Constitutional: Negative for fever and chills.  HENT: Negative for ear pain.   Eyes: Negative for blurred vision and pain.  Respiratory: Negative for cough, shortness of breath and wheezing.   Cardiovascular: Negative for chest pain and leg swelling.  Gastrointestinal: Negative for nausea, vomiting, abdominal pain and diarrhea.  Musculoskeletal:       LLE pain  Skin: Negative for itching and rash.  Neurological: Negative for dizziness, sensory change, focal weakness and headaches.    Objective:  Physical Exam: Filed Vitals:   05/28/15 1347  BP: 148/106  Pulse: 120  Temp: 97.6 F (36.4 C)  TempSrc: Oral  Weight: 150 lb 3.2 oz (68.13 kg)  SpO2: 97%   Physical Exam  Constitutional: She is oriented to person, place, and time. She appears well-developed and well-nourished.  HENT:  Head: Normocephalic and atraumatic.  Eyes: EOM are normal. Pupils are equal, round, and reactive to light.  Neck: Neck supple. No tracheal deviation present.  Cardiovascular: Normal rate and intact distal pulses.   Irregular rhythm    Pulmonary/Chest: Effort normal. No respiratory distress. She has no wheezes. She has no rales.  Abdominal: Soft. Bowel sounds are normal. She exhibits no distension. There is no tenderness.  Musculoskeletal: She exhibits tenderness. She exhibits no edema.  Bilateral lower extremities: cold on palpation. Symmetric in size and  non-tender to palpation. Homan's sign negative. Chronic venous statis skin changes and varicose veins noted. +1 dorsalis pedis pulses.    Neurological: She is alert and oriented to person, place, and time.  Skin: Skin is warm and dry.   Assessment & Plan:

## 2015-05-28 NOTE — Assessment & Plan Note (Signed)
Patient complaining of pain in her L lower leg that started several months ago. States the pain is present at rest and as per daughter pt does not ambulate much. During physical exam, it was noted that both the lower extremities were symmetric in size, no pain on palpation, and Homan's sign negative. DVT is less likely.  She does have a history of PE in the past but is currently on Xarelto. In addition, patient did not complain of any CP or SOB making PE less likely. ABI from 03/2015 was 1.5 bilaterally, which could be falsely normal because arterial doppler from 03/2015 showing 30-49% bilateral SFA disease. Both lower extremities were cold to palpation and pulses were diminished. Her symptoms are likely secondary to arterial insufficiency.  -Tylenol #3 increased to 1-2 tabs q8 prn, #45 with 1 refill -Vascular surgery referral -Follow-up visit in 2 weeks.

## 2015-05-28 NOTE — Assessment & Plan Note (Deleted)
Patient did not complain of nausea today. She is requesting a refill on her acid reflux medicine. -Ranitidine refilled

## 2015-05-28 NOTE — Assessment & Plan Note (Deleted)
Not discussed at this visit. -A1c ordered today. F/u at next visit.

## 2015-05-28 NOTE — Patient Instructions (Signed)
[name] it was nice meeting you today.  -Take Tylenol #3: 1-2 tablets by mouth every 8 hours as needed for leg pain.  -I have referred you to vascular surgery. You will be hearing from our office soon with an appointment date.  -Your Ranitidine prescription has been refilled today.  -Please return for a follow-up visit in 2 weeks.

## 2015-05-29 ENCOUNTER — Other Ambulatory Visit: Payer: Self-pay | Admitting: *Deleted

## 2015-05-29 ENCOUNTER — Other Ambulatory Visit: Payer: Self-pay | Admitting: Internal Medicine

## 2015-05-29 NOTE — Telephone Encounter (Signed)
Pharmacist fixed the ranitidine so no worries for it

## 2015-05-30 ENCOUNTER — Encounter: Payer: Medicare Other | Admitting: Internal Medicine

## 2015-05-30 NOTE — Addendum Note (Signed)
Addended by: Erlinda Hong T on: 05/30/2015 08:32 AM   Modules accepted: Level of Service

## 2015-05-30 NOTE — Progress Notes (Signed)
Internal Medicine Clinic Attending  I saw and evaluated the patient.  I personally confirmed the key portions of the history and exam documented by Dr. Loney Loh and I reviewed pertinent patient test results.  The assessment, diagnosis, and plan were formulated together and I agree with the documentation in the resident's note.  Elderly woman with rest pain in her left leg from the knee through the foot. Clinical course seems most consistent with peripheral artery disease. It does not sound like osteoarthritis based on the location of the pain. There is no swelling of the extremity, moderate stasis changes of the skin with moderate varicose veins. The foot is cool to the touch and the peripheral pulses are faint. ABI completed already was 1.5, which seems to be falsely elevated. She is anticoagulated for prior PE, but no signs of DVT on exam and good compliance with medication. Based on her exam, I want her to be evaluated by vascular surgery to see if she could have clinically apparent symptomatic rest PAD but have a "normal" vascular ultrasound. We are going to carefully escalate pain control, we counseled the family about side-effects of narcotic therapy, and currently the goal is to keep her functional and comfortable at night.

## 2015-06-04 NOTE — Patient Outreach (Signed)
[facility]) Care Management  06/04/2015  Jamora Hartline [birth year] [id number]   Referral from [facility] tier 4 list, assigned Donato Schultz, RN to outreach for [facility] Care Management services.  Thanks, Corrie Mckusick. Sharlee Blew [facility] Care Management [facility] CM Assistant Phone: [phone] Fax: [phone]

## 2015-06-06 NOTE — Telephone Encounter (Signed)
Called pt's daughter and gave her the message, she is agreeable

## 2015-06-08 ENCOUNTER — Encounter: Payer: Self-pay | Admitting: *Deleted

## 2015-06-08 ENCOUNTER — Other Ambulatory Visit: Payer: Self-pay | Admitting: Internal Medicine

## 2015-06-08 DIAGNOSIS — M79605 Pain in left leg: Secondary | ICD-10-CM

## 2015-06-08 NOTE — Telephone Encounter (Signed)
Pt daughter states she can not find her mother Rx for tylenol 3, she want the doctor to write another Rx. She is at work now and will be available after 12, but you can call her at work.

## 2015-06-08 NOTE — Telephone Encounter (Signed)
Dr Andrey Campanile, i called in the script from 10/10, ask the pharmacist not to honor any paper scripts, she is agreeable, i will try to keep this on my list to check [facility] in nov to see if it turns up at any other pharmacies

## 2015-06-08 NOTE — Telephone Encounter (Signed)
Ty#3 last filled at [facility] on 9/20, this may be why she cannot find this med, daughter calls and states she has misplaced the med, please advise Sending this to dr Andrey Campanile and dr Midwife

## 2015-06-11 ENCOUNTER — Other Ambulatory Visit: Payer: Self-pay

## 2015-06-11 DIAGNOSIS — I10 Essential (primary) hypertension: Secondary | ICD-10-CM

## 2015-06-11 MED ORDER — ACETAMINOPHEN-CODEINE #3 300-30 MG PO TABS: 1.0000 | ORAL_TABLET | Freq: Three times a day (TID) | ORAL | Status: DC | PRN

## 2015-06-11 NOTE — Patient Outreach (Signed)
[facility]) Care Management  06/11/2015  [name] Apr 29, 1926 [id number]  Telephonic Screen  Referral Date: 06/04/15 Referral Source: [facility] Tier 4 Referral Reason: CHF, DM, HTN, 2 hospital admits, 2 ED visits  Social.  Patient lives with her duaghter/caregiver/[name].  Patient is able to ambulate cautiously to bathroom without assistance.  Falls:  1 over the last 6 months;  no injury.  DME:  CBG meter, home BP cuff PCP:  Dr. Andrey Campanile:  Last appt with covering MD was 05/28/15.  Next appt in  3 months.    THN Conditions:  CHF, DM, HTN BS this morning - 146.  States she checks BS one time a day.   Last MD office BP reading 148/106 05/28/15.  BP cuff in the home; checks on occasion.  RN CM had daughter check BP while on phone call.  Reading 171/97.  Patient has scales in the home and only weights when she wants to.   Pain Patient states she has chronic leg pain related to vascular issues.   Patient takes pain medication for management.   Medications States less than 10 medications.   Daughter states MD has instructed in plan for patient to see another eye doctor.  MD does not want to prescribe any eye medications any longer and prefers patient to see eye MD.   Daughter states Dr. Andrey Campanile had been giving patient  the eye gtts. But other covering MD said eye  appt is needed.  Daughter states she lost patients pain medication prescription for Tylenol #3.  RN CM advised daughter she will need to contact MDs office to discuss.   Consent: Daughter and patient agreed to [facility] services.   Plan: RN CM sent referral to [facility] RN CM RN CM notified [facility] administrative assistant case opened/agreed to services.  RN CM advised to please notify MD of any changes in condition prior to scheduled appt's.   RN CM provided contact name and # [phone] or main office # [phone] and 24-hour nurse line # [phone].  RN CM confirmed patient is aware of 911  services for urgent emergency needs.  Donato Schultz, RN, BSN, [facility], CCM  [facility] Coordinator [phone] Direct [phone] Cell [phone] Office [phone] Fax

## 2015-06-11 NOTE — Telephone Encounter (Signed)
Pt daughter called to check on rx, advised [facility].

## 2015-06-11 NOTE — Telephone Encounter (Signed)
Any updates on this refill?

## 2015-06-11 NOTE — Patient Outreach (Signed)
[facility]) Care Management  06/11/2015  [name] 1925-12-29 [id number]   Telephonic Screen  Referral Date: 06/04/15 Referral Source: [facility] Tier 4 Referral Reason: CHF, DM, HTN, 2 hospital admits, 2 ED visits  Outreach call attempt #1 to patient. Phone went straight to voicemail message. RNCM left HIPPA compliant voicemail message for patient.   Plan: RNCM will attempt another outreach to patient within one to two weeks.  Donato Schultz, RN, BSN, [facility], CCM  [facility] Coordinator [phone] Direct [phone] Cell [phone] Office [phone] Fax

## 2015-06-12 NOTE — Patient Outreach (Signed)
[facility]) Care Management  06/12/2015  Leeza Grala 07-Apr-1926 [id number]   Request from Donato Schultz, RN to assign Community RN, assigned Elliot Cousin, RN.  Thanks, Corrie Mckusick. Sharlee Blew [facility] Care Management [facility] CM Assistant Phone: [phone] Fax: [phone]

## 2015-06-14 IMAGING — CT CT CHEST W/ CM
2 of 5 series · 13 of 36 positions shown, 16 images · IV contrast (Omni 300)
Comparison: CT scan 08/18/2013

CLINICAL DATA: Hemoptysis and weight loss. The hemoptysis began 2
weeks ago.

EXAM:
CT CHEST, ABDOMEN, AND PELVIS WITH CONTRAST
TECHNIQUE: Multidetector CT imaging of the chest, abdomen and pelvis was
performed following the standard protocol during bolus
administration of intravenous contrast.
CONTRAST:  100mL OMNIPAQUE IOHEXOL 300 MG/ML  SOLN

[Series 2: cap 5.0 i31f 1 · axial · 0.76mm/px · z∈[-510,-0]mm · 10 of 118 slices shown, 13 images]
[im 8/118  mediastinal]
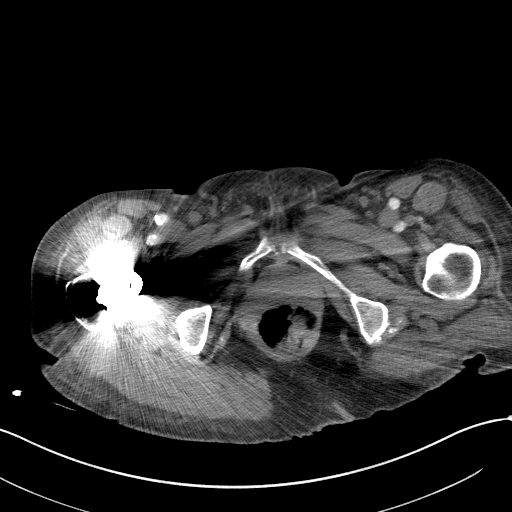
[im 8/118  lung]
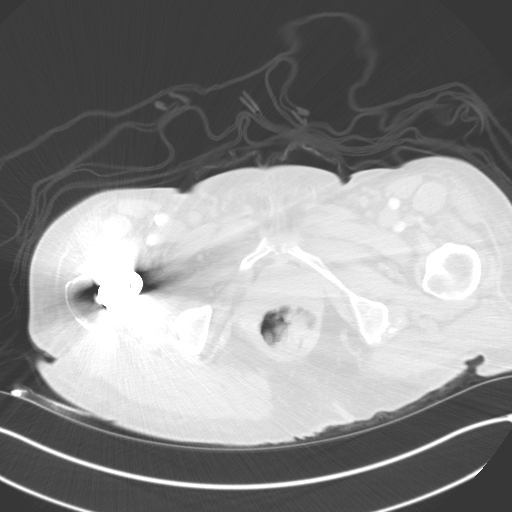
[im 30/118  lung]
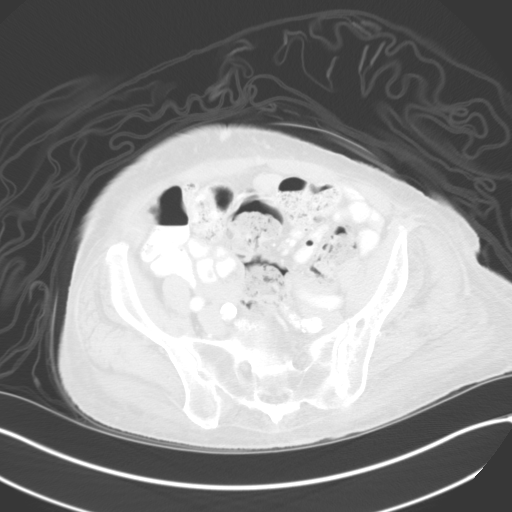
[im 37/118  lung]
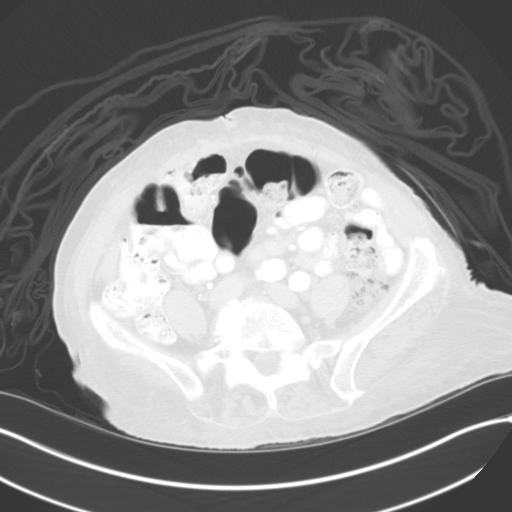
[im 44/118  lung]
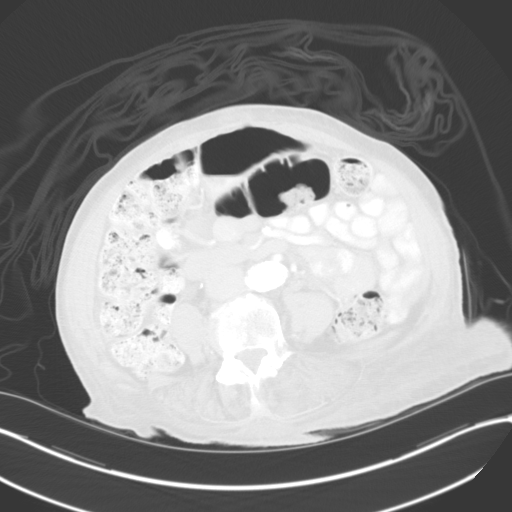
[im 59/118  mediastinal]
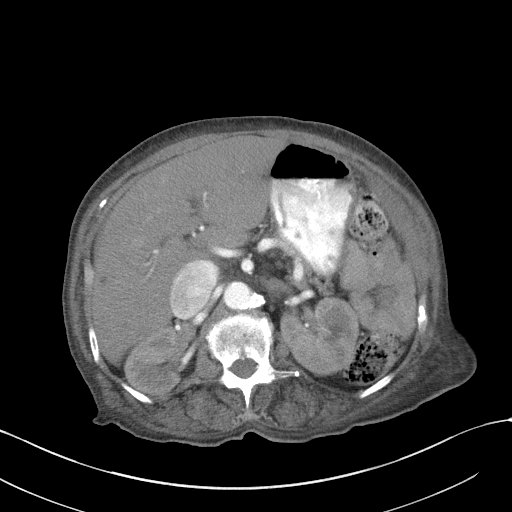
[im 59/118  lung]
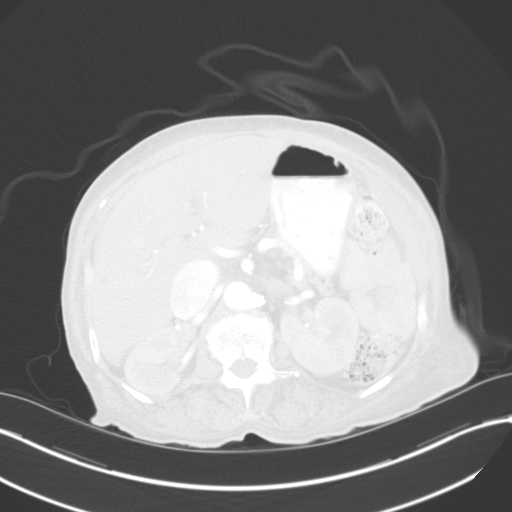
[im 66/118  lung]
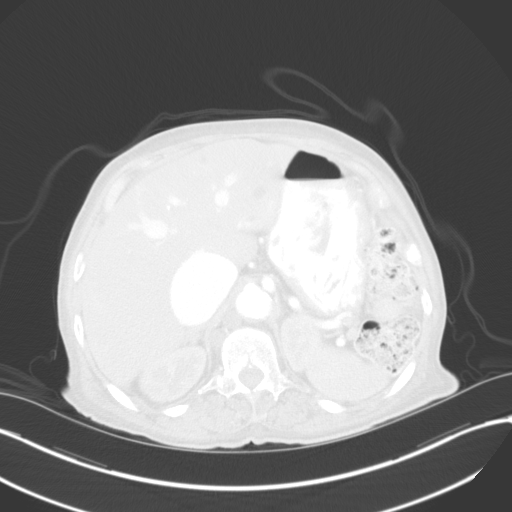
[im 81/118  lung]
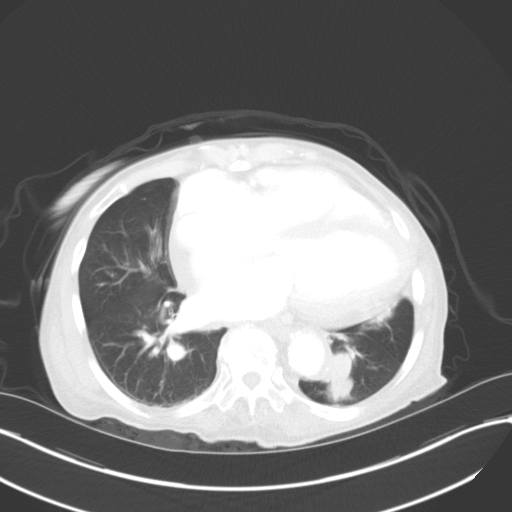
[im 88/118  lung]
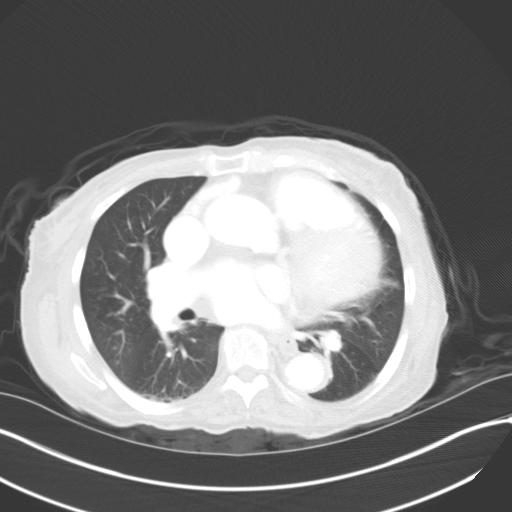
[im 96/118  mediastinal]
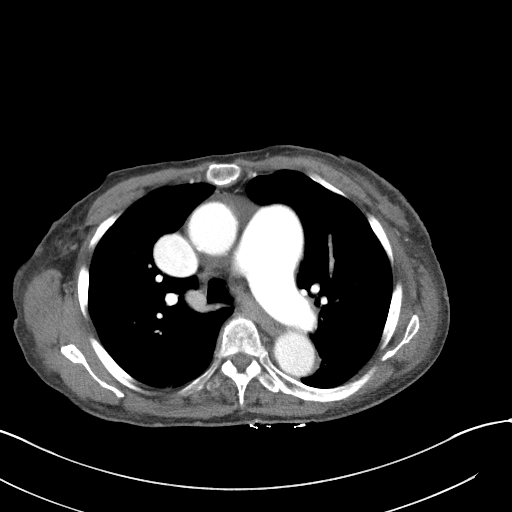
[im 96/118  lung]
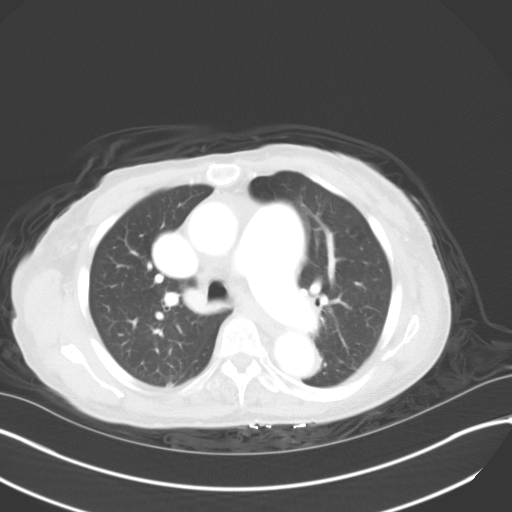
[im 110/118  lung]
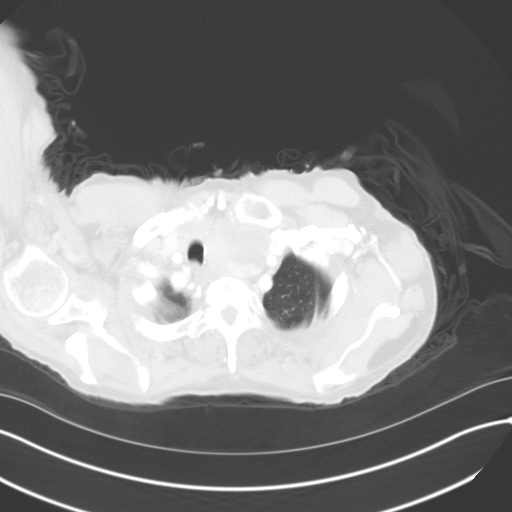

[Series 6: coronal · coronal · 0.72mm/px · 3 of 91 slices shown]
[im 19/91  lung]
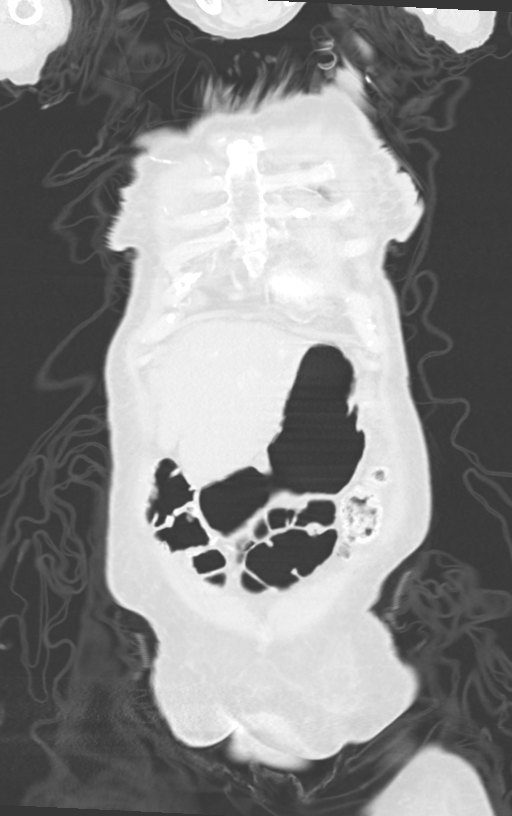
[im 37/91  lung]
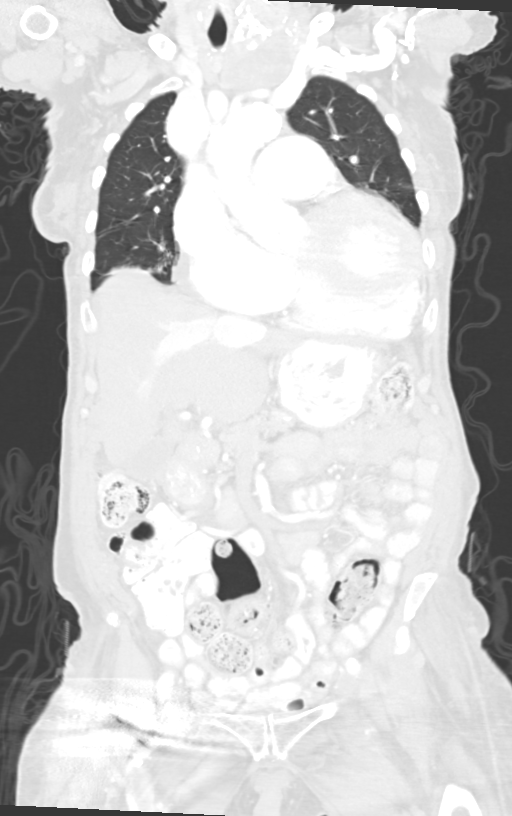
[im 55/91  lung]
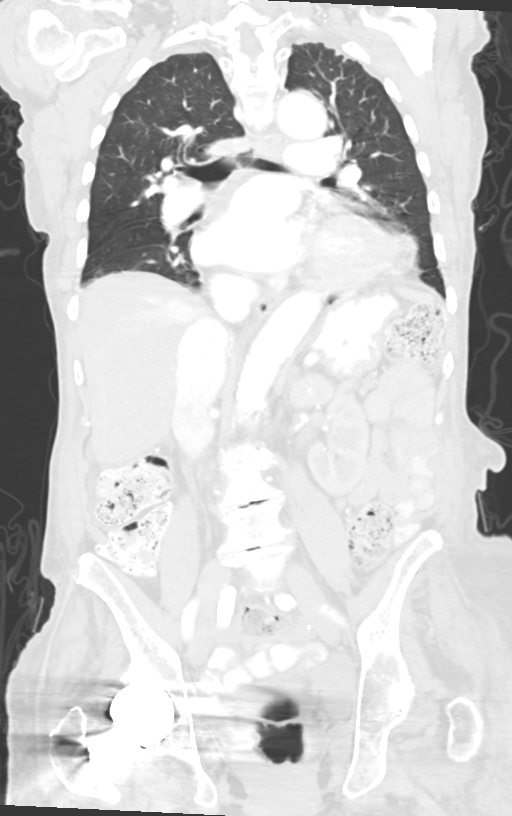

[13 of 36 positions shown; findings below may reference images not displayed]

FINDINGS: CT CHEST FINDINGS

Chest wall: No breast mass, supraclavicular or axillary
lymphadenopathy. Large left thyroid goiter noted with multiple
calcified lesions. There is mass effect on the trachea and
esophagus. This appears stable. The bony thorax is intact. No
destructive bone lesions. Moderate degenerative changes involving
the spine.

Mediastinum: Stable cardiac enlargement. There is tortuosity,
ectasia and calcification of the thoracic aorta and branch vessels.
The pulmonary arteries are enlarged suggesting pulmonary
hypertension. Three-vessel coronary artery calcifications are noted.
The esophagus is grossly normal.

Lungs/ pleura: Ill-defined 13 mm right upper lobe lung lesion could
be a pulmonary neoplasm or an inflammatory or infectious process.
There is a stable branching curvilinear lesion in the left lower
lobe which appears to be due to chronic bronchial obstruction and
atelectasis. It has not changed since August 2013 and is unlikely
endobronchial tumor. No infiltrates or effusions.

CT ABDOMEN AND PELVIS FINDINGS

Hepatobiliary: Multiple hepatic cysts and calcified granulomas are
noted. No worrisome hepatic lesions or intrahepatic biliary
dilatation. Small gallstones are noted in the gallbladder. No acute
inflammation. No common bile duct dilatation. There is significant
reflux of contrast down the IVC and into the hepatic veins which
could be due to right heart failure or tricuspid regurgitation.

Pancreas: Mild fatty change and atrophy of the pancreas. No mass or
inflammation.

Spleen: Normal

Adrenals/Urinary Tract: The adrenal glands and kidneys are grossly
normal. No mass lesions or hydronephrosis.

Stomach/Bowel: The stomach, duodenum, small bowel and colon are
grossly normal. No obvious inflammatory changes, mass lesions or
obstructive findings. Moderate stool throughout the colon may
suggest constipation.

Vascular/Lymphatic: No mesenteric or retroperitoneal mass or
adenopathy. Small scattered lymph nodes are noted. Advanced
atherosclerotic calcifications involving the aorta and branch
vessels but no focal aneurysm or dissection.

Other: The bladder is grossly normal. No obvious pelvic mass or
adenopathy. The deep pelvis is obscured by artifact from a right hip
prosthesis. No obvious inguinal mass or adenopathy.

Musculoskeletal: No significant bony findings.
IMPRESSION: 1. Ill-defined 13 mm right upper lobe lung lesion is an
indeterminate finding. It could be inflammatory or infectious. A
short-term followup noncontrast chest CT in 3 months is suggested.
2. Stable chronic left lower lobe atelectasis likely due to chronic
bronchial obstruction. This is unchanged since August 2013 so is
unlikely an endobronchial neoplasm.
3. Stable cardiac enlargement and chronic reflux of contrast down
the IVC and into the hepatic veins which are dilated.
4. Stable small hepatic cysts.
5. Cholelithiasis.
6. Advanced atherosclerotic calcifications involving the aorta and
branch vessels without focal aneurysm or dissection.

## 2015-06-15 ENCOUNTER — Other Ambulatory Visit: Payer: Self-pay | Admitting: *Deleted

## 2015-06-15 ENCOUNTER — Telehealth: Payer: Self-pay | Admitting: Pharmacist

## 2015-06-15 ENCOUNTER — Encounter: Payer: Self-pay | Admitting: Surgery

## 2015-06-15 NOTE — Patient Outreach (Signed)
[facility]) Care Management  06/15/2015  [name] 03-02-1926 [id number]   Initial contact for community case management  RN attempted to reach pt at the provided contact numbers for home/work and was able to reach the pt's son [name] on his mobile line). RN spoke with the son and introduced the [facility] Community case management services for assisting this pt. Bieifly discussed pt was referred due to his BP son was in a hurry and could not conversant at this time due to him driving. RN provided contact name and number and requested a call back for further extend [facility] services to this pt. Will awaiting a call back. Note the home and work number were unsuccessful. The son's mobile was the only successful contact at this time.  Elliot Cousin, RN Care Management Coordinator [facility] Main Office [phone]

## 2015-06-15 NOTE — Patient Outreach (Signed)
[facility]) Care Management  06/15/2015  [name] 02-28-1926 [id number]   RN received a call back from pt's daughter [name] concerning this pt via voice message. RN will follow up accordingly and inquire further on how [facility] can assist this pt further.  Elliot Cousin, RN Care Management Coordinator [facility] Main Office [phone]

## 2015-06-18 ENCOUNTER — Encounter: Payer: Medicare Other | Admitting: Surgery

## 2015-06-18 ENCOUNTER — Other Ambulatory Visit: Payer: Self-pay | Admitting: *Deleted

## 2015-06-18 NOTE — Patient Outreach (Addendum)
[facility]) Care Management  06/18/2015  Adriahna Rasso 1926/06/25 [id number]   RN returned call to pt's daughter [name]) however unsuccessful but able to leave a HIPAA approved message requesting a call back. Will inquire further at that time concerning [facility] involvement and possible schedule home visit for community visits. Will continue outreach calls accordingly.  Elliot Cousin, RN Care Management Coordinator [facility] Main Office [phone]

## 2015-06-20 ENCOUNTER — Other Ambulatory Visit: Payer: Self-pay | Admitting: *Deleted

## 2015-06-20 NOTE — Patient Outreach (Signed)
[facility]) Care Management  06/20/2015  [name] 1925/12/05 [id number]  RN received a voice message from pt's daughter Hayden Rasmussen requesting a call back after 12 noon. Will attempt to contact caregiver tomorrow an inquire further on how [facility] can assist the pt further.    Elliot Cousin, RN Care Management Coordinator [facility] Main Office [phone]

## 2015-06-21 ENCOUNTER — Other Ambulatory Visit: Payer: Self-pay | Admitting: *Deleted

## 2015-06-21 ENCOUNTER — Encounter: Payer: Self-pay | Admitting: *Deleted

## 2015-06-21 NOTE — Telephone Encounter (Signed)
Tried contacting patient to clarify d/c metolazone per Dr. Andrey Campanile, unable to reach.

## 2015-06-21 NOTE — Patient Outreach (Signed)
[facility]) Care Management  06/21/2015  Ranata Claborn [birth year] [id number]  Telephone Outreach  RN returned call to [name] (daughter and primary caregiver) for this pt however pt was available to speak today. RN reintroduced the [facility] program and services and inquired further on pt's current medical issues. Daughter reports recent conversation on the phone with the [facility] telephone nurse pt's initial reading on the home BP cuff was 172/92 however when the conversation ended a retake was 140/78 both with no symptoms and states pt had just finish walking with the higher number. States pt's BP has been greet with no problems when taken in the pass and her diabetes and HF is under control with no reported issues. States the only problem has at this time is her "knees" states daughter. RN offered home visits to address ongoing education regarding her HF/DM/HTN however daughter and pt feels she is doing well and opt to decline home services for [facility] at this time. RN offered telephone follow up however pt feels she is doing well and willing to reach out to this RN if needed in the future for any related services. RN has informed caregiver and pt that this case will be closed at this time. RN will also notify pt's primary provider of pt's of this case closure.  Elliot Cousin, RN Care Management Coordinator [facility] Main Office [phone]

## 2015-06-26 ENCOUNTER — Encounter: Payer: Medicare Other | Admitting: Vascular Surgery

## 2015-06-27 ENCOUNTER — Telehealth: Payer: Self-pay | Admitting: Internal Medicine

## 2015-06-27 DIAGNOSIS — I509 Heart failure, unspecified: Secondary | ICD-10-CM | POA: Diagnosis not present

## 2015-06-27 NOTE — Telephone Encounter (Signed)
Talked with daughter [name] - pt has a clear productive cough. Denies temp. Wanted clinic to prescribe med - policy is to see pt in clinic. Went to pharmacy at lunch and bought OTC med for diabetes. Will call clinic for an appt if any change with cough. Stanton Kidney Domnique Vantine RN 06/27/15 2:30PM

## 2015-06-27 NOTE — Telephone Encounter (Signed)
Left message on ID recording - [facility] return call and will be in clinic till 4:30PM

## 2015-06-27 NOTE — Telephone Encounter (Signed)
Pt daughter requesting the nurse to call back. Please call pt daughter back after 12.

## 2015-06-29 NOTE — Patient Outreach (Signed)
[facility]) Care Management  06/29/2015  [name] 02-05-1926 [id number]   Notification received from Elliot Cousin, RN to close case due to patient declining services.  Chicquita Mendel L. Thaison Kolodziejski, AAS [facility] Care Management Assistant

## 2015-07-04 ENCOUNTER — Encounter: Payer: Medicare Other | Admitting: Vascular Surgery

## 2015-07-11 ENCOUNTER — Ambulatory Visit (INDEPENDENT_AMBULATORY_CARE_PROVIDER_SITE_OTHER): Payer: Medicare Other | Admitting: Internal Medicine

## 2015-07-11 ENCOUNTER — Encounter: Payer: Self-pay | Admitting: Internal Medicine

## 2015-07-11 VITALS — BP 157/74 | HR 70 | Temp 97.6°F | Wt 149.2 lb

## 2015-07-11 DIAGNOSIS — H409 Unspecified glaucoma: Secondary | ICD-10-CM

## 2015-07-11 DIAGNOSIS — M79605 Pain in left leg: Secondary | ICD-10-CM

## 2015-07-11 DIAGNOSIS — J4521 Mild intermittent asthma with (acute) exacerbation: Secondary | ICD-10-CM

## 2015-07-11 DIAGNOSIS — I10 Essential (primary) hypertension: Secondary | ICD-10-CM | POA: Diagnosis not present

## 2015-07-11 DIAGNOSIS — J069 Acute upper respiratory infection, unspecified: Secondary | ICD-10-CM | POA: Insufficient documentation

## 2015-07-11 LAB — GLUCOSE, CAPILLARY: GLUCOSE-CAPILLARY: 145 mg/dL — AB (ref 65–99)

## 2015-07-11 MED ORDER — DORZOLAMIDE HCL 2 % OP SOLN: 1.0000 [drp] | Freq: Two times a day (BID) | OPHTHALMIC | Status: AC

## 2015-07-11 MED ORDER — MOMETASONE FURO-FORMOTEROL FUM 100-5 MCG/ACT IN AERO: 2.0000 | INHALATION_SPRAY | Freq: Two times a day (BID) | RESPIRATORY_TRACT | Status: DC

## 2015-07-11 MED ORDER — ALBUTEROL SULFATE HFA 108 (90 BASE) MCG/ACT IN AERS: 2.0000 | INHALATION_SPRAY | Freq: Four times a day (QID) | RESPIRATORY_TRACT | Status: DC | PRN

## 2015-07-11 MED ORDER — GLUCOSE BLOOD VI STRP: ORAL_STRIP | Status: AC

## 2015-07-11 MED ORDER — BRIMONIDINE TARTRATE 0.1 % OP SOLN: 1.0000 [drp] | Freq: Two times a day (BID) | OPHTHALMIC | Status: AC

## 2015-07-11 MED ORDER — FERROUS SULFATE 325 (65 FE) MG PO TABS: ORAL_TABLET | ORAL | Status: AC

## 2015-07-11 NOTE — Assessment & Plan Note (Signed)
Symptoms most c/w viral URI. Has since resolved. Completely asymptomatic today at our visit, normal exam. -Follow-up PRN

## 2015-07-11 NOTE — Progress Notes (Signed)
   Patient ID: [name] female   DOB: [birth year] 81 y.o.   MRN: [id number]  Subjective:   HPI: [name] is a 81 y.o. with PMH of HTN, AFib on Xarelto, DM, NICM, Churg-strauss syndrom who presents to [facility] today for evaluation of URI symptoms. She was having 2 weeks of clear rhinorrea and cough productive of clear sputum without any other symptoms until 2 days ago. She was symptomatic when she made the appointment but her symptoms resolved before coming here today. She has no other symptoms or issues at this time.    Please see problem-based charting for status of medical issues pertinent to this visit.  Review of Systems: Pertinent items noted in HPI and remainder of comprehensive ROS otherwise negative.  Objective:  Physical Exam: Filed Vitals:   07/11/15 1529 07/11/15 1559  BP: 184/86 157/74  Pulse: 96 70  Temp: 97.6 F (36.4 C)   TempSrc: Oral   Weight: 149 lb 3.2 oz (67.677 kg)   SpO2: 99%    Gen: Well-appearing, alert and oriented to person, place, and time HEENT: Oropharynx clear without erythema or exudate.  Neck: No cervical LAD, no thyromegaly or nodules, no JVD noted. CV: Normal rate, regular rhythm, no murmurs, rubs, or gallops Pulmonary: Normal effort, CTA bilaterally, no wheezing, rales, or rhonchi Abdominal: Soft, non-tender, non-distended, without rebound, guarding, or masses Extremities: Distal pulses 2+ in upper and lower extremities bilaterally, no tenderness, erythema or edema Skin: No atypical appearing moles. No rashes  Assessment & Plan:  Please see problem-based charting for assessment and plan.  Reubin Milan, MD Resident Physician, PGY-1 Department of Internal Medicine [facility]

## 2015-07-11 NOTE — Assessment & Plan Note (Signed)
Unchanged in quality from previous visits. Pain well-controlled on current regimen. Was initially scheduled to see vascular before this visit, but had scheduling changes and now is scheduled 1 week from today. -Follow-up after vascular sees her.

## 2015-07-11 NOTE — Assessment & Plan Note (Signed)
BP Readings from Last 3 Encounters:  07/11/15 157/74  05/28/15 148/106  03/19/15 131/71    Lab Results  Component Value Date   NA 136 03/19/2015   K 3.7 03/19/2015   CREATININE 0.66 03/19/2015    Assessment: Blood pressure control:  slightly elevated Progress toward BP goal:   near goal Comments: on lisinopril 10, spironolactone 25, and metoprolol xl 50mg   Plan: Medications:  continue current medications

## 2015-07-16 ENCOUNTER — Encounter: Payer: Self-pay | Admitting: Vascular Surgery

## 2015-07-18 ENCOUNTER — Encounter: Payer: Self-pay | Admitting: Vascular Surgery

## 2015-07-18 ENCOUNTER — Ambulatory Visit (INDEPENDENT_AMBULATORY_CARE_PROVIDER_SITE_OTHER): Payer: Medicare Other | Admitting: Vascular Surgery

## 2015-07-18 VITALS — BP 167/113 | HR 101 | Temp 98.4°F | Resp 16 | Ht 65.0 in | Wt 145.0 lb

## 2015-07-18 DIAGNOSIS — I739 Peripheral vascular disease, unspecified: Secondary | ICD-10-CM | POA: Diagnosis not present

## 2015-07-18 DIAGNOSIS — I872 Venous insufficiency (chronic) (peripheral): Secondary | ICD-10-CM | POA: Diagnosis not present

## 2015-07-18 NOTE — Progress Notes (Signed)
Referred by:  Yolanda Manges, DO [address] [place], [place] [address]  Reason for referral: PAD  History of Present Illness  [name] is a 81 y.o. (April 09, 1926) female who presents with chief complaint: L calf pain.  Onset of symptom occurred >6 months ago without obvious trigger.  Pt's memory is limited so part of history is obtained from family..  Pain is described as aching in posterior calf with walking, severity 1-5/10, and associated with leg swelling.  Reportedly 6 month of ago the patient's legs were severely swollen.  Patient has attempted to treat this pain with rest.  The patient has no rest pain symptoms also and no leg wounds/ulcers.  Reportedly this patient's walking is limited by joint pain and poor cardiac function.  Atherosclerotic risk factors include: HTN, HLD, and DM.   Past Medical History  Diagnosis Date  . Hypertension   . Asthma   . Osteoarthritis     left shoulder  . Pseudogout   . Hypokalemia     a. Previously felt due to diuretics, req supplementation.  . Churg-Strauss syndrome (HCC)     a. Sural nerve biopsy 02/2000.  Darene Lamer disease     a. s/p thyroidectomy.  . Glaucoma   . Hypercholesteremia   . Venous insufficiency   . Osteopenia     DEXA 2004  . Lipoma     left inner thigh  . Cataract     left eye  . VASCULITIS   . Chronic combined systolic and diastolic heart failure (HCC)     a. 02/2012 EF:20-25%  . Pleural effusion, left     a. Thoracentesis 2001 - per notes, no malignant cells, was transudative.  Marland Kitchen NSVT (nonsustained ventricular tachycardia) (HCC)     a. During CHF adm 2001.  . Multifocal atrial tachycardia (HCC)     a. Documented as OP 01/2012.  Marland Kitchen Ectopic atrial tachycardia (HCC)     a. Documented on tele as IP 02/2012.  . Pulmonary HTN (HCC)     a. Mod by echo 02/2012.  . Valvular heart disease     a. Echo 02/2012: mod MR/TR, mild AI.  Marland Kitchen Pericardial effusion     a. Echo 02/2012: small-mod pericardial effusion.  . Atrial  fibrillation, permanent (HCC)     a. Dx 04/2013, on metoprolol, dig and xarelto  . Shortness of breath   . CHF (congestive heart failure) (HCC)   . On home oxygen therapy     "wear it mostly at night" (09/20/2014)  . Pneumonia 1930's  . Type II diabetes mellitus [facility])     Past Surgical History  Procedure Laterality Date  . Abdominal hysterectomy    . Salpingoophorectomy    . Thyroidectomy    . Hemiarthroplasty hip Right 10/2010    cemented for hip fracture, femoral neck - by Eulas Post, MD  . Cataract extraction Right   . Fracture surgery    . Sural nerve biopsy  02/2000    Social History   Social History  . Marital Status: Widowed    Spouse Name: N/A  . Number of Children: N/A  . Years of Education: N/A   Occupational History  . Retired    Social History Main Topics  . Smoking status: Never Smoker   . Smokeless tobacco: Never Used  . Alcohol Use: No  . Drug Use: No  . Sexual Activity: Not on file   Other Topics Concern  . Not on file  Social History Narrative   Lives in house, with family just next door.    Never smoker, no alcohol.      Used to work at [employer].    Before that, worked in food prep.                    Family History  Problem Relation Age of Onset  . Diabetes Mother   . Diabetes Father   . Heart disease Neg Hx     Current Outpatient Prescriptions  Medication Sig Dispense Refill  . acetaminophen-codeine (TYLENOL #3) 300-30 MG tablet Take 1-2 tablets by mouth every 8 (eight) hours as needed (for pain). 45 tablet 1  . albuterol (PROVENTIL HFA;VENTOLIN HFA) 108 (90 BASE) MCG/ACT inhaler Inhale 2 puffs into the lungs every 6 (six) hours as needed for wheezing or shortness of breath. 1 Inhaler 2  . brimonidine (ALPHAGAN P) 0.1 % SOLN Place 1 drop into the left eye every 12 (twelve) hours. 45 mL 0  . dorzolamide (TRUSOPT) 2 % ophthalmic solution Place 1 drop into the left eye 2 (two) times daily. 10 mL 3  . ferrous  sulfate 325 (65 FE) MG tablet TAKE 1 TABLET(325 MG) BY MOUTH DAILY 30 tablet 3  . furosemide (LASIX) 80 MG tablet Take 80 mg by mouth. Take 1 tab if weight 151 lb or greater    . glucose blood test strip Use as instructed 100 each 12  . lisinopril (PRINIVIL,ZESTRIL) 10 MG tablet Take 1 tablet (10 mg total) by mouth daily. 30 tablet 1  . lisinopril (PRINIVIL,ZESTRIL) 10 MG tablet TAKE 1 TABLET(10 MG) BY MOUTH DAILY 30 tablet 3  . metFORMIN (GLUCOPHAGE) 1000 MG tablet Take 1 tablet (1,000 mg total) by mouth 2 (two) times daily with a meal. 60 tablet 3  . metoprolol succinate (TOPROL-XL) 50 MG 24 hr tablet TAKE 2 TABLETS BY MOUTH EVERY MORNING AND 1 TABLET BY MOUTH EVERY EVENING 90 tablet 3  . mometasone-formoterol (DULERA) 100-5 MCG/ACT AERO Inhale 2 puffs into the lungs 2 (two) times daily. 39 g 1  . potassium chloride SA (K-DUR,KLOR-CON) 20 MEQ tablet Take 2 tablets (40 mEq total) by mouth daily. Only take this medication on days you take Lasix. 60 tablet 0  . ranitidine (ZANTAC) 75 MG tablet Take 1 tablet (75 mg total) by mouth 2 (two) times daily. 60 tablet 0  . spironolactone (ALDACTONE) 25 MG tablet TAKE 1 TABLET BY MOUTH EVERY DAY 90 tablet 1  . XARELTO 20 MG TABS tablet TAKE 1 TABLET BY MOUTH DAILY WITH DINNER 30 tablet 11  . ferrous sulfate 325 (65 FE) MG EC tablet     . metolazone (ZAROXOLYN) 2.5 MG tablet     . ranitidine (ZANTAC) 150 MG tablet      No current facility-administered medications for this visit.    Allergies  Allergen Reactions  . Amiodarone Other (See Comments)    Contributed to hypoxia     REVIEW OF SYSTEMS:  (Positives checked otherwise negative)  CARDIOVASCULAR:    chest pain,   chest pressure,   palpitations,   shortness of breath when laying flat,   shortness of breath with exertion,    pain in feet when walking,   pain in feet when laying flat,  history of blood clot in veins (DVT),   history of phlebitis,   swelling in  legs,   varicose veins  PULMONARY:    productive  cough,  [x]  oxygen at home, [x]  wheezing  NEUROLOGIC:   [ ]  weakness in arms or legs,  [ ]  numbness in arms or legs,  [ ]  difficulty speaking or slurred speech,  [ ]  temporary loss of vision in one eye,  [ ]  dizziness  HEMATOLOGIC:   [ ]  bleeding problems,  [ ]  problems with blood clotting too easily  MUSCULOSKEL:   [ ]  joint pain, [ ]  joint swelling  GASTROINTEST:   [ ]  vomiting blood,  [ ]  blood in stool     GENITOURINARY:   [ ]  burning with urination,  [ ]  blood in urine  PSYCHIATRIC:   [ ]  history of major depression  INTEGUMENTARY:   [ ]  rashes,  [ ]  ulcers  CONSTITUTIONAL:   [ ]  fever,  [ ]  chills   For VQI Use Only  PRE-ADM LIVING: Home  AMB STATUS: Wheelchair  CAD Sx: None  PRIOR CHF: Moderate  STRESS TEST: [x]  No, [ ]  Normal, [ ]  + ischemia, [ ]  + MI, [ ]  Both   Physical Examination  Filed Vitals:   07/18/15 1416 07/18/15 1418  BP: 162/102 167/113  Pulse: 101 101  Temp: 98.4 F (36.9 C)   Resp: 16   Height: 5\' 5"  (1.651 m)   Weight: 145 lb (65.772 kg)   SpO2: 98%    Body mass index is 24.13 kg/(m^2).  General: A&O x 3, WD, elderly  Head: [place]/AT  Ear/Nose/Throat: Hearing grossly intact, nares without erythema or drainage, oropharynx without Erythema/Exudate, Mallampati score: 3  Eyes: PERRLA, EOMI  Neck: Supple, no nuchal rigidity, no palpable LAD  Pulmonary: Sym exp, good air movt, CTAB, no rales, rhonchi, & wheezing  Cardiac: irregular rate and rhythm, Nl S1, S2, no rubs or gallops, faintly murmur  Vascular: Vessel Right Left  Radial Palpable Palpable  Brachial Palpable Palpable  Carotid Palpable, without bruit Palpable, without bruit  Aorta Not palpable N/A  Femoral Palpable Palpable  Popliteal Not palpable Not palpable  PT Palpable Faintly Palpable  DP Faintly Palpable Palpable   Gastrointestinal: soft, NTND, no G/R, no HSM, no masses, no CVAT  B  Musculoskeletal: M/S 5/5 throughout , Extremities without ischemic changes , R mod LDS, B edema 2+  Neurologic: CN 2-12 intact , Pain and light touch intact in extremities , Motor exam as listed above  Psychiatric: Judgment intact, Mood & affect appropriate for pt's clinical situation  Dermatologic: See M/S exam for extremity exam, no rashes otherwise noted  Lymph : No Cervical, Axillary, or Inguinal lymphadenopathy    Non-Invasive Vascular Imaging  Outside ABI (Date: 04/09/15)  R:   ABI: 1.5,   DP: bi,   PT: tri,   TBI: 0.46  L:   ABI: 1.5   DP: tri,   PT: tri,   TBI: 0.75   Medical Decision Making  Jamieka Royle is a 81 y.o. female who presents with: severe cardiomyopathy impacting functional status, BLE PAD with medial calcification, likely BLE CVI   Despite the medial calcification, she essentially has intact perfusion to both legs.  I doubt any benefits to intervention in this patient.    I suspect her L leg sx to be more related to her chronic venous insufficiency.  Based on this patient's history and physical exam, I recommend: BLE venous reflux duplex.  The family declines such.  I also recommended BLE compression stocking use.  I suspect she would have difficulties with a full therapeutic stocking so I would stick  with OTC stockings. The patient is currently not on a statin: defer to cardiology. The patient is currently not on an anti-platelet: due to bleed risk as also on Xarelto. Patient's blood pressure was found to be elevated.  Patient was referred back to their PCP for further management.  Thank you for allowing [place] to participate in this patient's care.   Leonides Sake, MD [facility] Office: [phone] Pager: [phone]  07/18/2015, 2:37 PM

## 2015-07-18 NOTE — Progress Notes (Signed)
Internal Medicine Clinic Attending  I saw and evaluated the patient.  I personally confirmed the key portions of the history and exam documented by Dr. Kennedy and I reviewed pertinent patient test results.  The assessment, diagnosis, and plan were formulated together and I agree with the documentation in the resident's note.  

## 2015-07-24 ENCOUNTER — Other Ambulatory Visit: Payer: Self-pay | Admitting: Internal Medicine

## 2015-07-24 ENCOUNTER — Other Ambulatory Visit: Payer: Self-pay

## 2015-07-24 MED ORDER — ACCU-CHEK SOFTCLIX LANCET DEV MISC: Status: DC

## 2015-07-24 NOTE — Telephone Encounter (Signed)
Pt requesting Lancets to be filled.

## 2015-07-26 ENCOUNTER — Other Ambulatory Visit: Payer: Self-pay | Admitting: Internal Medicine

## 2015-07-27 DIAGNOSIS — I509 Heart failure, unspecified: Secondary | ICD-10-CM | POA: Diagnosis not present

## 2015-08-23 ENCOUNTER — Telehealth: Payer: Self-pay | Admitting: Dietician

## 2015-08-23 ENCOUNTER — Ambulatory Visit (INDEPENDENT_AMBULATORY_CARE_PROVIDER_SITE_OTHER): Payer: Medicare Other | Admitting: Internal Medicine

## 2015-08-23 ENCOUNTER — Encounter: Payer: Self-pay | Admitting: Internal Medicine

## 2015-08-23 VITALS — BP 138/102 | HR 74 | Temp 97.5°F | Ht 67.0 in | Wt 149.4 lb

## 2015-08-23 DIAGNOSIS — I5042 Chronic combined systolic (congestive) and diastolic (congestive) heart failure: Secondary | ICD-10-CM

## 2015-08-23 DIAGNOSIS — I872 Venous insufficiency (chronic) (peripheral): Secondary | ICD-10-CM | POA: Diagnosis not present

## 2015-08-23 DIAGNOSIS — Z79899 Other long term (current) drug therapy: Secondary | ICD-10-CM | POA: Diagnosis not present

## 2015-08-23 DIAGNOSIS — H539 Unspecified visual disturbance: Secondary | ICD-10-CM | POA: Insufficient documentation

## 2015-08-23 DIAGNOSIS — E119 Type 2 diabetes mellitus without complications: Secondary | ICD-10-CM

## 2015-08-23 MED ORDER — BD ULTRA-FINE LANCETS MISC: Status: AC

## 2015-08-23 NOTE — Assessment & Plan Note (Addendum)
Patient complaining of 2 weeks worsening lower extremity edema.  Swelling is relieved with her PRN Lasix 80 mg PO which she has been taking twice daily.  It is also relieved with elevating legs.  She was recently seen by Vascular surgery, who also agreed with venous insufficiency diagnosis.  Compression stockings were recommended at that visit, but patient has not bought any.  She endorses SOB at night, waking her up from sleep, but she is able to lie flat at night.  She does not sleep on any pillows.  Her weight is 149 lb today, consistent with her dry weight.  Last BNP was 03/2015, slightly elevated at 423.  A/P: HFrEF, stable.  LE swelling more consistent with venous insufficiency than HFrEF exacerbation. Will continue Lasix on PRN basis to prevent AKI in this patient.  - Lasix 80 mg PO PRN weight > 150 lb - Continue Metoprolol 50 mg daily - Continue Lisinopril 10 mg

## 2015-08-23 NOTE — Telephone Encounter (Signed)
Patient and daughter request help scheduling an eye exam . [facility] per their request:  Her last  Visit was : February 07, 2014 saw Dr. Burgess Estelle.  They lost her to follow up and did not have her correct address. Routine eye exam scheduled for: Monday January 23,  245 PM Faxed  over office notes per their request. Patient and daughter notified via mail

## 2015-08-23 NOTE — Assessment & Plan Note (Signed)
Patient reports recent history of seeing people that she knows are not there at night when trying to sleep.  It used to happen intermittently, but recently has happened more often.  She states she talks to them but they do not talk back.  She denies SI or HI.  She sleeps with the lights and TV on all night.  A/P: Most likely sundowning.  Do not see any standard anticholinergic medications that may cause these disturbances.  Will attempt lifestyle modifications and reassess at next visit. - Lights and TV off and noise minimized during sleep.

## 2015-08-23 NOTE — Progress Notes (Signed)
Case discussed with Dr. Ladona Ridgel at the time of the visit.  We reviewed the resident's history and exam and pertinent patient test results.  I agree with the assessment, diagnosis, and plan of care documented in the resident's note.  Although clinically she does not appear to have a decompensated cardiomyopathy, if her blood pressure remains elevated at the next visit we will consider titrating up her ACE dose to 20 mg daily if not otherwise contraindicated.

## 2015-08-23 NOTE — Assessment & Plan Note (Signed)
Patient complaining of 2 weeks worsening lower extremity edema.  Swelling is relieved with her PRN Lasix 80 mg PO which she has been taking twice daily.  It is also relieved with elevating legs.  She was recently seen by Vascular surgery, who also agreed with venous insufficiency diagnosis.  Compression stockings were recommended at that visit, but patient has not bought any.  She endorses SOB at night, waking her up from sleep, but she is able to lie flat at night.  She does not sleep on any pillows.  Her weight is 149 lb today, consistent with her dry weight.  Last BNP was 03/2015, slightly elevated at 423.  TSH in 10/2014 was normal.  On exam, patient has 2+ pitting edema to knee with minimal erythema of dorsal foot.  No calf tenderness or cords.  A/P: Venous insufficiency.  Patient is comfortable, without crackles on exam or orthopnea.  Does not appear to be heart failure exacerbation.  Low concern for DVT, pretibial myxedema, or Churg-Strauss vasculitis.  Will attempt conservative management and re-evaluate. - Lasix 80 mg PO PRN weight > 150 lb - Compression stockings of ~20 mmHg to be worn during day - Keep legs elevated above level of heart - RTC 1 month for recheck

## 2015-08-23 NOTE — Patient Instructions (Signed)
1. Continue Lasix as prescribed for weight over 150 lb. 2. Keep legs above the level of the heart. 3. Wear compression stockings with 15-20 mmHg for at least 16 hours per day (Jobst stockings). 4. Return to clinic in 1 month.  How to Use Compression Stockings Compression stockings are elastic socks that squeeze the legs. They help to increase blood flow to the legs, decrease swelling in the legs, and reduce the chance of developing blood clots in the lower legs. Compression stockings are often used by people who:  Are recovering from surgery.  Have poor circulation in their legs.  Are prone to getting blood clots in their legs.  Have varicose veins.  Sit or stay in bed for long periods of time. HOW TO USE COMPRESSION STOCKINGS Before you put on your compression stockings:  Make sure that they are the correct size. If you do not know your size, ask your health care provider.  Make sure that they are clean, dry, and in good condition.  Check them for rips and tears. Do not put them on if they are ripped or torn. Put your stockings on first thing in the morning, before you get out of bed. Keep them on for as long as your health care provider advises. When you are wearing your stockings:  Keep them as smooth as possible. Do not allow them to bunch up. It is especially important to prevent the stockings from bunching up around your toes or behind your knees.  Do not roll the stockings downward and leave them rolled down. This can decrease blood flow to your leg.  Change them right away if they become wet or dirty. When you take off your stockings, inspect your legs and feet. Anything that does not seem normal may require medical attention. Look for:  Open sores.  Red spots.  Swelling. INFORMATION AND TIPS  Do not stop wearing your compression stockings without talking to your health care provider first.  Wash your stockings everyday with mild detergent in cold or warm water. Do  not use bleach. Air-dry your stockings or dry them in a clothes dryer on low heat.  Replace your stockings every 3-6 months.  If skin moisturizing is part of your treatment plan, apply lotion or cream at night so that your skin will be dry when you put on the stockings in the morning. It is harder to put the stockings on when you have lotion on your legs or feet. SEEK MEDICAL CARE IF: Remove your stockings and seek medical care if:  You have a feeling of pins and needles in your feet or legs.  You have any new changes in your skin.  You have skin lesions that are getting worse.  You have swelling or pain that is getting worse. SEEK IMMEDIATE MEDICAL CARE IF:  You have numbness or tingling in your lower legs that does not get better immediately after you take the stockings off.  Your toes or feet become cold and blue.  You develop open sores or red spots on your legs that do not go away.  You see or feel a warm spot on your leg.  You have new swelling or soreness in your leg.  You are short of breath or you have chest pain for no reason.  You have a rapid or irregular heartbeat.  You feel light-headed or dizzy.   This information is not intended to replace advice given to you by your health care provider. Make sure you  discuss any questions you have with your health care provider.   Document Released: 06/01/2009 Document Revised: 12/19/2014 Document Reviewed: 07/12/2014 Elsevier Interactive Patient Education [employer].

## 2015-08-23 NOTE — Progress Notes (Signed)
Patient ID: Jovina Mompremier, female   DOB: 26-Oct-1925, 82 y.o.   MRN: [id number]    Subjective:   Patient ID: In Shidler female   DOB: 10-31-1925 82 y.o.   MRN: [id number]  HPI: Ms.Maryna Sago is a 82 y.o. female with PMH as below, here for evaluation of LE edema.  Please see Problem-Based charting for the status of the patient's chronic medical issues.  She denies fevers, chills, CP, N/V, constipation, diarrhea, or dysuria.  She endorses SOB at night.  Past Medical History  Diagnosis Date  . Hypertension   . Asthma   . Osteoarthritis     left shoulder  . Pseudogout   . Hypokalemia     a. Previously felt due to diuretics, req supplementation.  . Churg-Strauss syndrome (HCC)     a. Sural nerve biopsy 02/2000.  Darene Lamer disease     a. s/p thyroidectomy.  . Glaucoma   . Hypercholesteremia   . Venous insufficiency   . Osteopenia     DEXA 2004  . Lipoma     left inner thigh  . Cataract     left eye  . VASCULITIS   . Chronic combined systolic and diastolic heart failure (HCC)     a. 02/2012 EF:20-25%  . Pleural effusion, left     a. Thoracentesis 2001 - per notes, no malignant cells, was transudative.  Marland Kitchen NSVT (nonsustained ventricular tachycardia) (HCC)     a. During CHF adm 2001.  . Multifocal atrial tachycardia (HCC)     a. Documented as OP 01/2012.  Marland Kitchen Ectopic atrial tachycardia (HCC)     a. Documented on tele as IP 02/2012.  . Pulmonary HTN (HCC)     a. Mod by echo 02/2012.  . Valvular heart disease     a. Echo 02/2012: mod MR/TR, mild AI.  Marland Kitchen Pericardial effusion     a. Echo 02/2012: small-mod pericardial effusion.  . Atrial fibrillation, permanent (HCC)     a. Dx 04/2013, on metoprolol, dig and xarelto  . Shortness of breath   . CHF (congestive heart failure) (HCC)   . On home oxygen therapy     "wear it mostly at night" (09/20/2014)  . Pneumonia 1930's  . Type II diabetes mellitus (HCC)    Current Outpatient Prescriptions  Medication Sig Dispense Refill  . ACCU-CHEK  SOFTCLIX LANCETS lancets USE AS INSTRUCTED 300 each 1  . acetaminophen-codeine (TYLENOL #3) 300-30 MG tablet Take 1-2 tablets by mouth every 8 (eight) hours as needed (for pain). 45 tablet 1  . albuterol (PROVENTIL HFA;VENTOLIN HFA) 108 (90 BASE) MCG/ACT inhaler Inhale 2 puffs into the lungs every 6 (six) hours as needed for wheezing or shortness of breath. 1 Inhaler 2  . brimonidine (ALPHAGAN P) 0.1 % SOLN Place 1 drop into the left eye every 12 (twelve) hours. 45 mL 0  . dorzolamide (TRUSOPT) 2 % ophthalmic solution Place 1 drop into the left eye 2 (two) times daily. 10 mL 3  . ferrous sulfate 325 (65 FE) MG EC tablet     . ferrous sulfate 325 (65 FE) MG tablet TAKE 1 TABLET(325 MG) BY MOUTH DAILY 30 tablet 3  . furosemide (LASIX) 80 MG tablet Take 80 mg by mouth. Take 1 tab if weight 151 lb or greater    . glucose blood test strip Use as instructed 100 each 12  . lisinopril (PRINIVIL,ZESTRIL) 10 MG tablet Take 1 tablet (10 mg total) by mouth daily. 30 tablet 1  . lisinopril (  PRINIVIL,ZESTRIL) 10 MG tablet TAKE 1 TABLET(10 MG) BY MOUTH DAILY 30 tablet 3  . metFORMIN (GLUCOPHAGE) 1000 MG tablet Take 1 tablet (1,000 mg total) by mouth 2 (two) times daily with a meal. 60 tablet 3  . metolazone (ZAROXOLYN) 2.5 MG tablet     . metoprolol succinate (TOPROL-XL) 50 MG 24 hr tablet TAKE 2 TABLETS BY MOUTH EVERY MORNING AND 1 TABLET BY MOUTH EVERY EVENING 90 tablet 3  . mometasone-formoterol (DULERA) 100-5 MCG/ACT AERO Inhale 2 puffs into the lungs 2 (two) times daily. 39 g 1  . potassium chloride SA (K-DUR,KLOR-CON) 20 MEQ tablet Take 2 tablets (40 mEq total) by mouth daily. Only take this medication on days you take Lasix. 60 tablet 0  . ranitidine (ZANTAC) 150 MG tablet     . ranitidine (ZANTAC) 75 MG tablet Take 1 tablet (75 mg total) by mouth 2 (two) times daily. 60 tablet 0  . spironolactone (ALDACTONE) 25 MG tablet TAKE 1 TABLET BY MOUTH EVERY DAY 90 tablet 1  . XARELTO 20 MG TABS tablet TAKE 1  TABLET BY MOUTH DAILY WITH DINNER 30 tablet 11   No current facility-administered medications for this visit.   Family History  Problem Relation Age of Onset  . Diabetes Mother   . Diabetes Father   . Heart disease Neg Hx    Social History   Social History  . Marital Status: Widowed    Spouse Name: N/A  . Number of Children: N/A  . Years of Education: N/A   Occupational History  . Retired    Social History Main Topics  . Smoking status: Never Smoker   . Smokeless tobacco: Never Used  . Alcohol Use: No  . Drug Use: No  . Sexual Activity: Not on file   Other Topics Concern  . Not on file   Social History Narrative   Lives in house, with family just next door.    Never smoker, no alcohol.      Used to work at [employer].    Before that, worked in food prep.                   Review of Systems: Pertinent items are noted in HPI. Objective:  Physical Exam: There were no vitals filed for this visit. Physical Exam  Constitutional: She is oriented to person, place, and time and well-developed, well-nourished, and in no distress.  Elderly female, sitting in chair.  HENT:  Head: Normocephalic and atraumatic.  Eyes: EOM are normal. No scleral icterus.  Neck: No JVD present. No tracheal deviation present.  Cardiovascular:  Normal rate. Irregularly irregular rhythm. No murmurs appreciated.  DP pulses faint but palpable bilaterally.  Pulmonary/Chest: Breath sounds normal. No respiratory distress. She has no wheezes.  No crackles appreciated.  Abdominal: Soft. She exhibits no distension. There is no tenderness. There is no rebound and no guarding.  Musculoskeletal:  2+ pitting edema to knee bilaterally.  No calf tenderness or cords.  Neurological: She is alert and oriented to person, place, and time.  No audio-visual disturbances.  Skin: Skin is warm and dry.  2+ pitting edema to knee bilaterally.  Minimal erythema to dorsal feet bilaterally.       Assessment & Plan:   Patient and case were discussed with Dr. Josem Kaufmann.  Please refer to Problem Based charting for further documentation.

## 2015-08-27 DIAGNOSIS — I509 Heart failure, unspecified: Secondary | ICD-10-CM | POA: Diagnosis not present

## 2015-09-05 ENCOUNTER — Encounter: Payer: Medicare Other | Admitting: Internal Medicine

## 2015-09-05 ENCOUNTER — Other Ambulatory Visit: Payer: Self-pay | Admitting: Internal Medicine

## 2015-09-07 ENCOUNTER — Other Ambulatory Visit: Payer: Self-pay | Admitting: Internal Medicine

## 2015-09-07 NOTE — Telephone Encounter (Signed)
Next scheduled visit 09/14/15 with PCP

## 2015-09-13 ENCOUNTER — Telehealth: Payer: Self-pay | Admitting: Internal Medicine

## 2015-09-13 NOTE — Telephone Encounter (Signed)
Call to patient to confirm appointment for 09/14/15 at 3:45 lmtcb ° °

## 2015-09-14 ENCOUNTER — Encounter: Payer: Self-pay | Admitting: Internal Medicine

## 2015-09-14 ENCOUNTER — Ambulatory Visit (INDEPENDENT_AMBULATORY_CARE_PROVIDER_SITE_OTHER): Payer: Medicare Other | Admitting: Internal Medicine

## 2015-09-14 VITALS — BP 170/96 | HR 74 | Temp 97.4°F | Wt 162.4 lb

## 2015-09-14 DIAGNOSIS — H539 Unspecified visual disturbance: Secondary | ICD-10-CM

## 2015-09-14 DIAGNOSIS — I482 Chronic atrial fibrillation, unspecified: Secondary | ICD-10-CM

## 2015-09-14 DIAGNOSIS — Z7901 Long term (current) use of anticoagulants: Secondary | ICD-10-CM | POA: Diagnosis not present

## 2015-09-14 DIAGNOSIS — I5043 Acute on chronic combined systolic (congestive) and diastolic (congestive) heart failure: Secondary | ICD-10-CM | POA: Diagnosis not present

## 2015-09-14 DIAGNOSIS — E119 Type 2 diabetes mellitus without complications: Secondary | ICD-10-CM

## 2015-09-14 DIAGNOSIS — I110 Hypertensive heart disease with heart failure: Secondary | ICD-10-CM

## 2015-09-14 DIAGNOSIS — J4521 Mild intermittent asthma with (acute) exacerbation: Secondary | ICD-10-CM

## 2015-09-14 DIAGNOSIS — Z7984 Long term (current) use of oral hypoglycemic drugs: Secondary | ICD-10-CM | POA: Diagnosis not present

## 2015-09-14 DIAGNOSIS — I5023 Acute on chronic systolic (congestive) heart failure: Secondary | ICD-10-CM

## 2015-09-14 DIAGNOSIS — I10 Essential (primary) hypertension: Secondary | ICD-10-CM

## 2015-09-14 LAB — POCT GLYCOSYLATED HEMOGLOBIN (HGB A1C): HEMOGLOBIN A1C: 7.5

## 2015-09-14 LAB — GLUCOSE, CAPILLARY: Glucose-Capillary: 122 mg/dL — ABNORMAL HIGH (ref 65–99)

## 2015-09-14 MED ORDER — LISINOPRIL 20 MG PO TABS: 20.0000 mg | ORAL_TABLET | Freq: Every day | ORAL | Status: DC

## 2015-09-14 NOTE — Progress Notes (Signed)
   Subjective:    Patient ID: [name], female    DOB: 1925-08-22, 82 y.o.   MRN: [id number]  HPI Comments: [name] is an 82 year old woman with PMH as below here for follow-up of HTN.  Please see problem based charting for the status of her chronic conditions.     Review of Systems  Constitutional: Positive for unexpected weight change. Negative for fever, chills and appetite change.       Weight gain.  Respiratory: Positive for shortness of breath. Negative for cough and wheezing.   Cardiovascular: Positive for leg swelling. Negative for chest pain and palpitations.       Swelling improving.    Gastrointestinal: Positive for nausea and diarrhea. Negative for vomiting and blood in stool.       Reports 3 loose stools today, normal BMs up until today.  She thinks it is related to something she ate yesterday (chicken liver, squash at home). Abdominal pain resolved after BM.  Genitourinary: Negative for dysuria, hematuria, vaginal bleeding and difficulty urinating.  Hematological: Does not bruise/bleed easily.  Psychiatric/Behavioral: Positive for hallucinations and sleep disturbance. Negative for confusion and dysphoric mood. The patient is not nervous/anxious.        No anhedonia.  Good social support.  Visual hallucinations (seeing people in her room at night). Goes to sleep at 10PM and wakes up around 3AM when her daughter goes to work.         Filed Vitals:   09/14/15 1553 09/14/15 1656  BP: 171/97 170/96  Pulse: 88 74  Temp: 97.4 F (36.3 C)   TempSrc: Oral   Weight: 162 lb 6.4 oz (73.664 kg)   SpO2:  96%     Objective:   Physical Exam  Constitutional: She is oriented to person, place, and time. She appears well-developed. No distress.  HENT:  Head: Normocephalic and atraumatic.  Mouth/Throat: Oropharynx is clear and moist. No oropharyngeal exudate.  Eyes: EOM are normal. Pupils are equal, round, and reactive to light.  Neck: Neck supple. JVD present.    Cardiovascular: Normal rate and regular rhythm.  Exam reveals no gallop and no friction rub.   No murmur heard. Irregularly irregular  Pulmonary/Chest: Effort normal and breath sounds normal. No respiratory distress. She has no wheezes. She has no rales.  Abdominal: Soft. Bowel sounds are normal. She exhibits no distension. There is no tenderness. There is no rebound and no guarding.  Musculoskeletal: Normal range of motion. She exhibits edema. She exhibits no tenderness.  Neurological: She is alert and oriented to person, place, and time. No cranial nerve deficit.  Upper/lower extr strength and sensation grossly intact and equal   Skin: Skin is warm. She is not diaphoretic.  Psychiatric: She has a normal mood and affect. Her behavior is normal. Judgment and thought content normal.  Vitals reviewed.         Assessment & Plan:  Please see problem based charting for A&P.

## 2015-09-14 NOTE — Patient Instructions (Signed)
1. Please increase your lisinopril from 10mg  daily to 20mg  daily (I sent a new prescription to your pharmacy).  Please ask your daughter to call me and let me know how much lasix you have been taking.  We may need to increase it until you see Dr. Shirlee Latch.  I will refill your albuterol inhaler.  Just use the [facility] two times per day   2. Please take all medications as prescribed.   3. If you have worsening of your symptoms or new symptoms arise, please call the clinic ([phone]), or go to the ER immediately if symptoms are severe.   Please come back to clinic in 2 weeks so we can check your blood pressure.

## 2015-09-17 ENCOUNTER — Other Ambulatory Visit: Payer: Self-pay | Admitting: Internal Medicine

## 2015-09-17 DIAGNOSIS — J4521 Mild intermittent asthma with (acute) exacerbation: Secondary | ICD-10-CM

## 2015-09-17 DIAGNOSIS — H409 Unspecified glaucoma: Secondary | ICD-10-CM

## 2015-09-17 NOTE — Telephone Encounter (Signed)
Pt requesting dorzolamide and albuterol inhaler to be filled @ [facility] on [address].

## 2015-09-18 DIAGNOSIS — I509 Heart failure, unspecified: Secondary | ICD-10-CM | POA: Insufficient documentation

## 2015-09-18 MED ORDER — ALBUTEROL SULFATE HFA 108 (90 BASE) MCG/ACT IN AERS: 2.0000 | INHALATION_SPRAY | Freq: Four times a day (QID) | RESPIRATORY_TRACT | Status: AC | PRN

## 2015-09-18 NOTE — Assessment & Plan Note (Signed)
BP Readings from Last 3 Encounters:  09/14/15 170/96  08/23/15 138/102  07/18/15 167/113    Lab Results  Component Value Date   NA 136 03/19/2015   K 3.7 03/19/2015   CREATININE 0.66 03/19/2015    Assessment: Blood pressure control:  uncontrolled Progress toward BP goal:   worsened Comments: She reports med compliance.  No ADRs.  Plan: Medications:  continue current medications:  Toprol XL 50mg  daily, spironolactone 25mg  daily, Lasix, INCREASE lisinopril from 10mg  to 20mg  daily. Educational resources provided:   Self management tools provided:   Other plans: BMP next visit.

## 2015-09-18 NOTE — Assessment & Plan Note (Signed)
Lab Results  Component Value Date   HGBA1C 7.5 09/14/2015   HGBA1C 7.4 05/28/2015   HGBA1C 6.6 12/08/2014     Assessment: Diabetes control:  good control Progress toward A1C goal:   at goal Comments: compliant with metformin 1g BID (GFR ok).  No ADRs.  Plan: Medications:  continue current medications:  Metformin 1g BID Home glucose monitoring:  No Frequency:   Timing:   Instruction/counseling given: discussed diet Educational resources provided:   Self management tools provided:   Other plans: follow-up in June 2017.

## 2015-09-18 NOTE — Assessment & Plan Note (Addendum)
Assessment:  She is still experiencing visual hallucinations at night.  No auditory hallucination, no mood symptoms, no delusions to suggest psychiatric illness.  No memory problems (son corroborates this) to suggest dementia.  She usually sleeps with tv on but did try cutting it off for past week with no relief in symptoms.  She is asleep by 10PM and up around 3AM when her daughter goes to work.  Son says she naps a lot during the day so I think she is likely getting close to 6-7 hours of sleep in 24 hours. Plan:  Continue to work on sleep hygiene.  Will plan memory assessment next visit.

## 2015-09-18 NOTE — Assessment & Plan Note (Signed)
Assessment:  Rate controlled on Toprol.  No bleeding on Xarelto. Plan:  Continue Toprol XL 50mg  daily and Xarelto 20mg  daily.

## 2015-09-18 NOTE — Assessment & Plan Note (Addendum)
Assessment:  She admits to dietary indiscretion.  Weight is up, +JVD and she is having LE edema and dyspnea.  She thinks she restarted the Lasix (for weight > 150) but she is not sure.  Her son is at the visit but her daughter is the one who prepares the meds.  He is not sure if she is back on Lasix or not.  SpO2 96% and hemodynamically stable so ok to go home.   Plan:  I have asked her son to have his sister call me and let me know if Ms. Isackson is taking Lasix.  If she is taking Lasix 80mg  daily, I will add an additional 40mg  in the evening.  If she has not resumed then we can just resume Lasix 80mg  daily.  Continue Toprol XL 50mg  daily, spironolactone 25mg  daily, INCREASE lisinopril from 10mg  to 20mg  due to elevated BP.  Follow-up with Dr. Shirlee Latch.  I spoke with her daugher [name] and Ms. Hufnagle has not resumed Lasix.  She also says that her mother had increased dietary indiscretion (high salt foods).  She agrees to resume Lasix 80mg  daily and weigh daily.  Stop Lasix when weight < 150.  Resume K supplement with Lasix.  Watch diet.  She will arrange visit with Dr. Shirlee Latch.

## 2015-09-19 NOTE — Progress Notes (Signed)
Internal Medicine Clinic Attending  Case discussed with Dr. Wilson soon after the resident saw the patient.  We reviewed the resident's history and exam and pertinent patient test results.  I agree with the assessment, diagnosis, and plan of care documented in the resident's note.  

## 2015-09-20 ENCOUNTER — Other Ambulatory Visit: Payer: Self-pay | Admitting: Internal Medicine

## 2015-09-25 DIAGNOSIS — Z961 Presence of intraocular lens: Secondary | ICD-10-CM | POA: Diagnosis not present

## 2015-09-25 DIAGNOSIS — H401421 Capsular glaucoma with pseudoexfoliation of lens, left eye, mild stage: Secondary | ICD-10-CM | POA: Diagnosis not present

## 2015-09-25 DIAGNOSIS — E119 Type 2 diabetes mellitus without complications: Secondary | ICD-10-CM | POA: Diagnosis not present

## 2015-09-25 DIAGNOSIS — H5213 Myopia, bilateral: Secondary | ICD-10-CM | POA: Diagnosis not present

## 2015-09-27 DIAGNOSIS — I509 Heart failure, unspecified: Secondary | ICD-10-CM | POA: Diagnosis not present

## 2015-10-01 NOTE — Telephone Encounter (Signed)
Lm again for rtc 

## 2015-10-04 ENCOUNTER — Encounter: Payer: Self-pay | Admitting: *Deleted

## 2015-10-25 DIAGNOSIS — I509 Heart failure, unspecified: Secondary | ICD-10-CM | POA: Diagnosis not present

## 2015-10-29 ENCOUNTER — Telehealth (HOSPITAL_COMMUNITY): Payer: Self-pay | Admitting: Vascular Surgery

## 2015-10-29 ENCOUNTER — Encounter (HOSPITAL_COMMUNITY): Payer: Medicare Other

## 2015-10-29 NOTE — Telephone Encounter (Signed)
Returned call to reschedule appt , line is busy will call back later

## 2015-11-25 DIAGNOSIS — I509 Heart failure, unspecified: Secondary | ICD-10-CM | POA: Diagnosis not present

## 2015-12-10 ENCOUNTER — Emergency Department (HOSPITAL_COMMUNITY): Payer: Medicare Other

## 2015-12-10 ENCOUNTER — Encounter (HOSPITAL_COMMUNITY): Payer: Self-pay | Admitting: Emergency Medicine

## 2015-12-10 ENCOUNTER — Emergency Department (HOSPITAL_COMMUNITY)
Admission: EM | Admit: 2015-12-10 | Discharge: 2015-12-10 | Disposition: A | Discharge status: Home / Self Care | Payer: Medicare Other | Attending: Emergency Medicine | Admitting: Emergency Medicine

## 2015-12-10 DIAGNOSIS — Z79899 Other long term (current) drug therapy: Secondary | ICD-10-CM | POA: Diagnosis not present

## 2015-12-10 DIAGNOSIS — E876 Hypokalemia: Secondary | ICD-10-CM | POA: Insufficient documentation

## 2015-12-10 DIAGNOSIS — E119 Type 2 diabetes mellitus without complications: Secondary | ICD-10-CM | POA: Diagnosis not present

## 2015-12-10 DIAGNOSIS — W228XXA Striking against or struck by other objects, initial encounter: Secondary | ICD-10-CM | POA: Insufficient documentation

## 2015-12-10 DIAGNOSIS — I4891 Unspecified atrial fibrillation: Secondary | ICD-10-CM | POA: Insufficient documentation

## 2015-12-10 DIAGNOSIS — S92401A Displaced unspecified fracture of right great toe, initial encounter for closed fracture: Secondary | ICD-10-CM | POA: Insufficient documentation

## 2015-12-10 DIAGNOSIS — M1990 Unspecified osteoarthritis, unspecified site: Secondary | ICD-10-CM | POA: Diagnosis not present

## 2015-12-10 DIAGNOSIS — H409 Unspecified glaucoma: Secondary | ICD-10-CM | POA: Diagnosis not present

## 2015-12-10 DIAGNOSIS — I5042 Chronic combined systolic (congestive) and diastolic (congestive) heart failure: Secondary | ICD-10-CM | POA: Insufficient documentation

## 2015-12-10 DIAGNOSIS — Z7984 Long term (current) use of oral hypoglycemic drugs: Secondary | ICD-10-CM | POA: Diagnosis not present

## 2015-12-10 DIAGNOSIS — Z86018 Personal history of other benign neoplasm: Secondary | ICD-10-CM | POA: Insufficient documentation

## 2015-12-10 DIAGNOSIS — S92421A Displaced fracture of distal phalanx of right great toe, initial encounter for closed fracture: Secondary | ICD-10-CM | POA: Diagnosis not present

## 2015-12-10 DIAGNOSIS — Z8701 Personal history of pneumonia (recurrent): Secondary | ICD-10-CM | POA: Insufficient documentation

## 2015-12-10 DIAGNOSIS — S91209A Unspecified open wound of unspecified toe(s) with damage to nail, initial encounter: Secondary | ICD-10-CM

## 2015-12-10 DIAGNOSIS — Z9842 Cataract extraction status, left eye: Secondary | ICD-10-CM | POA: Diagnosis not present

## 2015-12-10 DIAGNOSIS — S91201A Unspecified open wound of right great toe with damage to nail, initial encounter: Secondary | ICD-10-CM | POA: Insufficient documentation

## 2015-12-10 DIAGNOSIS — Z7901 Long term (current) use of anticoagulants: Secondary | ICD-10-CM | POA: Insufficient documentation

## 2015-12-10 DIAGNOSIS — J45909 Unspecified asthma, uncomplicated: Secondary | ICD-10-CM | POA: Diagnosis not present

## 2015-12-10 DIAGNOSIS — I10 Essential (primary) hypertension: Secondary | ICD-10-CM | POA: Diagnosis not present

## 2015-12-10 DIAGNOSIS — Y92009 Unspecified place in unspecified non-institutional (private) residence as the place of occurrence of the external cause: Secondary | ICD-10-CM | POA: Insufficient documentation

## 2015-12-10 DIAGNOSIS — S99921A Unspecified injury of right foot, initial encounter: Secondary | ICD-10-CM | POA: Diagnosis present

## 2015-12-10 DIAGNOSIS — Y998 Other external cause status: Secondary | ICD-10-CM | POA: Diagnosis not present

## 2015-12-10 DIAGNOSIS — Z9981 Dependence on supplemental oxygen: Secondary | ICD-10-CM | POA: Diagnosis not present

## 2015-12-10 DIAGNOSIS — Y9389 Activity, other specified: Secondary | ICD-10-CM | POA: Insufficient documentation

## 2015-12-10 MED ORDER — CEFAZOLIN SODIUM 1-5 GM-% IV SOLN: 1.0000 g | Freq: Once | INTRAVENOUS | Status: DC

## 2015-12-10 MED ORDER — ACETAMINOPHEN-CODEINE 300-30 MG PO TABS: 1.0000 | ORAL_TABLET | ORAL | Status: DC | PRN

## 2015-12-10 MED ORDER — AMOXICILLIN-POT CLAVULANATE 875-125 MG PO TABS: 1.0000 | ORAL_TABLET | Freq: Two times a day (BID) | ORAL | Status: DC

## 2015-12-10 MED ORDER — AMOXICILLIN-POT CLAVULANATE 875-125 MG PO TABS
1.0000 | ORAL_TABLET | Freq: Once | ORAL | Status: AC
  Administered 2015-12-10: 1 via ORAL
  Filled 2015-12-10: qty 1

## 2015-12-10 NOTE — ED Notes (Signed)
EDP at bedside  

## 2015-12-10 NOTE — Discharge Instructions (Signed)
Nail Avulsion Injury Nail avulsion means that you have lost the whole, or part of a nail. The nail will usually grow back in 2 to 6 months. If your injury damaged the growth center of the nail, the nail may be deformed, split, or not stuck to the nail bed. Sometimes the avulsed nail is stitched back in place. This provides temporary protection to the nail bed until the new nail grows in.  HOME CARE INSTRUCTIONS   Raise (elevate) your injury as much as possible.  Protect the injury and cover it with bandages (dressings) or splints as instructed.  Change dressings as instructed. SEEK MEDICAL CARE IF:   There is increasing pain, redness, or swelling.  You cannot move your fingers or toes.   This information is not intended to replace advice given to you by your health care provider. Make sure you discuss any questions you have with your health care provider.   Document Released: 09/11/2004 Document Revised: 10/27/2011 Document Reviewed: 07/06/2009 Elsevier Interactive Patient Education 2016 [employer] Toe Fracture A toe fracture is a break in one of the toe bones (phalanges). CAUSES This condition may be caused by:  Dropping a heavy object on your toe.  Stubbing your toe.  Overusing your toe or doing repetitive exercise.  Twisting or stretching your toe out of place. RISK FACTORS This condition is more likely to develop in people who:  Play contact sports.  Have a bone disease.  Have a low calcium level. SYMPTOMS The main symptoms of this condition are swelling and pain in the toe. The pain may get worse with standing or walking. Other symptoms include:  Bruising.  Stiffness.  Numbness.  A change in the way the toe looks.  Broken bones that poke through the skin.  Blood beneath the toenail. DIAGNOSIS This condition is diagnosed with a physical exam. You may also have X-rays. TREATMENT  Treatment for this condition depends on the type of fracture and its  severity. Treatment may involve:  Taping the broken toe to a toe that is next to it (buddy taping). This is the most common treatment for fractures in which the bone has not moved out of place (nondisplaced fracture).  Wearing a shoe that has a wide, rigid sole to protect the toe and to limit its movement.  Wearing a walking cast.  Having a procedure to move the toe back into place.  Surgery. This may be needed:  If there are many pieces of broken bone that are out of place (displaced).  If the toe joint breaks.  If the bone breaks through the skin.  Physical therapy. This is done to help regain movement and strength in the toe. You may need follow-up X-rays to make sure that the bone is healing well and staying in position. HOME CARE INSTRUCTIONS If You Have a Cast:  Do not stick anything inside the cast to scratch your skin. Doing that increases your risk of infection.  Check the skin around the cast every day. Report any concerns to your health care provider. You may put lotion on dry skin around the edges of the cast. Do not apply lotion to the skin underneath the cast.  Do not put pressure on any part of the cast until it is fully hardened. This may take several hours.  Keep the cast clean and dry. Bathing  Do not take baths, swim, or use a hot tub until your health care provider approves. Ask your health care provider if you  can take showers. You may only be allowed to take sponge baths for bathing.  If your health care provider approves bathing and showering, cover the cast or bandage (dressing) with a watertight plastic bag to protect it from water. Do not let the cast or dressing get wet. Managing Pain, Stiffness, and Swelling  If you do not have a cast, apply ice to the injured area, if directed.  Put ice in a plastic bag.  Place a towel between your skin and the bag.  Leave the ice on for 20 minutes, 2-3 times per day.  Move your toes often to avoid stiffness  and to lessen swelling.  Raise (elevate) the injured area above the level of your heart while you are sitting or lying down. Driving  Do not drive or operate heavy machinery while taking pain medicine.  Do not drive while wearing a cast on a foot that you use for driving. Activity  Return to your normal activities as directed by your health care provider. Ask your health care provider what activities are safe for you.  Perform exercises daily as directed by your health care provider or physical therapist. Safety  Do not use the injured limb to support your body weight until your health care provider says that you can. Use crutches or other assistive devices as directed by your health care provider. General Instructions  If your toe was treated with buddy taping, follow your health care provider's instructions for changing the gauze and tape. Change it more often:  The gauze and tape get wet. If this happens, dry the space between the toes.  The gauze and tape are too tight and cause your toe to become pale or numb.  Wear a protective shoe as directed by your health care provider. If you were not given a protective shoe, wear sturdy, supportive shoes. Your shoes should not pinch your toes and should not fit tightly against your toes.  Do not use any tobacco products, including cigarettes, chewing tobacco, or e-cigarettes. Tobacco can delay bone healing. If you need help quitting, ask your health care provider.  Take medicines only as directed by your health care provider.  Keep all follow-up visits as directed by your health care provider. This is important. SEEK MEDICAL CARE IF:  You have a fever.  Your pain medicine is not helping.  Your toe is cold.  Your toe is numb.  You still have pain after one week of rest and treatment.  You still have pain after your health care provider has said that you can start walking again.  You have pain, tingling, or numbness in your foot  that is not going away. SEEK IMMEDIATE MEDICAL CARE IF:  You have severe pain.  You have redness or inflammation in your toe that is getting worse.  You have pain or numbness in your toe that is getting worse.  Your toe turns blue.   This information is not intended to replace advice given to you by your health care provider. Make sure you discuss any questions you have with your health care provider.   Document Released: 08/01/2000 Document Revised: 04/25/2015 Document Reviewed: 05/31/2014 Elsevier Interactive Patient Education [employer]

## 2015-12-10 NOTE — ED Notes (Signed)
Pt. Family member concerned about wait time to see physician. EDP notified.

## 2015-12-10 NOTE — ED Notes (Signed)
Pt reports that she hit her right great toe on her walker which tore the toenail and skin off. Bleeding is controlled at the moment by bandage from home. Pt is diabetic and on blood thinners. Pt alert. NAD at this time.

## 2015-12-11 ENCOUNTER — Telehealth (HOSPITAL_COMMUNITY): Payer: Self-pay | Admitting: Vascular Surgery

## 2015-12-11 NOTE — Telephone Encounter (Signed)
Left message to reschedule pt appt w/ [facility]

## 2015-12-11 NOTE — ED Provider Notes (Signed)
CSN: [id number]     Arrival date & time 12/10/15  1157 History   First MD Initiated Contact with Patient 12/10/15 1313     Chief Complaint  Patient presents with  . Toe Injury     HPI Comments: 82 year old female with PMH significant for DM, neuropathy, osteopenia, PAD, venous insufficiency, and generalized debility presents with a right toe injury. Her daughter is in the room and provides most of the history. She states her mother lives at home and is relatively immobile. She got up from he bed to get on the bedside commode. One of the legs of the commode was on top of the patient's right great toe when she sat down on it. She reported immediate pain and her toe nail was ripped off. Patient is on Xarelto. Last tetanus was 06/2011.   Past Medical History  Diagnosis Date  . Hypertension   . Asthma   . Osteoarthritis     left shoulder  . Pseudogout   . Hypokalemia     a. Previously felt due to diuretics, req supplementation.  . Churg-Strauss syndrome (HCC)     a. Sural nerve biopsy 02/2000.  Darene Lamer disease     a. s/p thyroidectomy.  . Glaucoma   . Hypercholesteremia   . Venous insufficiency   . Osteopenia     DEXA 2004  . Lipoma     left inner thigh  . Cataract     left eye  . VASCULITIS   . Chronic combined systolic and diastolic heart failure (HCC)     a. 02/2012 EF:20-25%  . Pleural effusion, left     a. Thoracentesis 2001 - per notes, no malignant cells, was transudative.  Marland Kitchen NSVT (nonsustained ventricular tachycardia) (HCC)     a. During CHF adm 2001.  . Multifocal atrial tachycardia (HCC)     a. Documented as OP 01/2012.  Marland Kitchen Ectopic atrial tachycardia (HCC)     a. Documented on tele as IP 02/2012.  . Pulmonary HTN (HCC)     a. Mod by echo 02/2012.  . Valvular heart disease     a. Echo 02/2012: mod MR/TR, mild AI.  Marland Kitchen Pericardial effusion     a. Echo 02/2012: small-mod pericardial effusion.  . Atrial fibrillation, permanent (HCC)     a. Dx 04/2013, on metoprolol, dig and  xarelto  . Shortness of breath   . CHF (congestive heart failure) (HCC)   . On home oxygen therapy     "wear it mostly at night" (09/20/2014)  . Pneumonia 1930's  . Type II diabetes mellitus [facility])    Past Surgical History  Procedure Laterality Date  . Abdominal hysterectomy    . Salpingoophorectomy    . Thyroidectomy    . Hemiarthroplasty hip Right 10/2010    cemented for hip fracture, femoral neck - by Eulas Post, MD  . Cataract extraction Right   . Fracture surgery    . Sural nerve biopsy  02/2000   Family History  Problem Relation Age of Onset  . Diabetes Mother   . Diabetes Father   . Heart disease Neg Hx    Social History  Substance Use Topics  . Smoking status: Never Smoker   . Smokeless tobacco: Never Used  . Alcohol Use: No   OB History    No data available     Review of Systems  Musculoskeletal: Positive for arthralgias.  Skin: Positive for wound.      Allergies  Amiodarone  Home Medications   Prior to Admission medications   Medication Sig Start Date End Date Taking? Authorizing Provider  acetaminophen-codeine (TYLENOL #3) 300-30 MG tablet Take 1-2 tablets by mouth every 8 (eight) hours as needed (for pain). 06/11/15  Yes Yolanda Manges, DO  albuterol (PROVENTIL HFA;VENTOLIN HFA) 108 (90 Base) MCG/ACT inhaler Inhale 2 puffs into the lungs every 6 (six) hours as needed for wheezing or shortness of breath. 09/18/15  Yes Alex Elson Clan, DO  brimonidine (ALPHAGAN P) 0.1 % SOLN Place 1 drop into the left eye every 12 (twelve) hours. 07/11/15  Yes Darrick Huntsman, MD  dorzolamide (TRUSOPT) 2 % ophthalmic solution Place 1 drop into the left eye 2 (two) times daily. 07/11/15  Yes Darrick Huntsman, MD  DULERA 100-5 MCG/ACT AERO INHALE 2 PUFFS INTO THE LUNGS TWICE DAILY 09/07/15  Yes Yolanda Manges, DO  ferrous sulfate 325 (65 FE) MG tablet TAKE 1 TABLET(325 MG) BY MOUTH DAILY 07/11/15  Yes Darrick Huntsman, MD  furosemide (LASIX) 80 MG tablet Take 80 mg by  mouth. Take 1 tab if weight 151 lb or greater   Yes Historical Provider, MD  lisinopril (PRINIVIL,ZESTRIL) 20 MG tablet Take 1 tablet (20 mg total) by mouth daily. 09/14/15  Yes Alex Elson Clan, DO  metFORMIN (GLUCOPHAGE) 1000 MG tablet TAKE 1 TABLET(1000 MG) BY MOUTH TWICE DAILY WITH A MEAL 09/20/15  Yes Yolanda Manges, DO  metoprolol succinate (TOPROL-XL) 50 MG 24 hr tablet TAKE 2 TABLETS BY MOUTH EVERY MORNING AND 1 TABLET EVERY EVENING 09/06/15  Yes Yolanda Manges, DO  potassium chloride SA (K-DUR,KLOR-CON) 20 MEQ tablet Take 2 tablets (40 mEq total) by mouth daily. Only take this medication on days you take Lasix. 11/17/14  Yes Alex Elson Clan, DO  ranitidine (ZANTAC) 75 MG tablet Take 1 tablet (75 mg total) by mouth 2 (two) times daily. Patient taking differently: Take 75 mg by mouth daily as needed for heartburn.  05/28/15 05/27/16 Yes John Giovanni, MD  spironolactone (ALDACTONE) 25 MG tablet TAKE 1 TABLET BY MOUTH EVERY DAY 05/31/15  Yes Alex Elson Clan, DO  XARELTO 20 MG TABS tablet TAKE 1 TABLET BY MOUTH DAILY WITH DINNER Patient taking differently: TAKE 1 TABLET BY MOUTH EVERY MORNING 05/10/15  Yes Burns Spain, MD  Acetaminophen-Codeine (TYLENOL/CODEINE #3) 300-30 MG tablet Take 1-2 tablets by mouth every 4 (four) hours as needed for pain. 12/10/15   Arby Barrette, MD  amoxicillin-clavulanate (AUGMENTIN) 875-125 MG tablet Take 1 tablet by mouth 2 (two) times daily. One po bid x 7 days 12/10/15   Arby Barrette, MD  BD ULTRA-FINE LANCETS lancets Use to check blood sugar one time daily 08/23/15   Jana Half, MD  glucose blood test strip Use as instructed 07/11/15   Darrick Huntsman, MD   BP 131/86 mmHg  Pulse 84  Temp(Src) 97.4 F (36.3 C) (Oral)  Resp 15  SpO2 95% Physical Exam  Constitutional: She is oriented to person, place, and time. No distress.  Elderly female  HENT:  Head: Normocephalic and atraumatic.  Eyes: Conjunctivae are normal. Pupils are equal, round, and reactive  to light. Right eye exhibits no discharge. Left eye exhibits no discharge. No scleral icterus.  Neck: Normal range of motion.  Pulmonary/Chest: Effort normal.  Musculoskeletal:  R ankle: No obvious deformity or bruising. 2+ pitting edema. FROM of ankle. R foot: Avulsion of right great toe nail. Foot is cool to touch. DP/PT unable to palpate due  to edema. Sensation decreased. FROM of all toes without pain. No tenderness to palpation of great toe. Bleeding controlled.  Neurological: She is alert and oriented to person, place, and time.  Skin: Skin is warm and dry.  Psychiatric: She has a normal mood and affect.    ED Course  Procedures (including critical care time) Labs Review Labs Reviewed - No data to display  Imaging Review Dg Foot Complete Right  12/10/2015  CLINICAL DATA:  Great toe injury. EXAM: RIGHT FOOT COMPLETE - 3+ VIEW COMPARISON:  None. FINDINGS: There is a fracture through the base of the distal phalanx of the right great toe. No significant displacement. No additional acute bony abnormality. Diffuse osteopenia. IMPRESSION: Fracture through the base of the right great toe distal phalanx. Electronically Signed   By: Charlett Nose M.D.   On: 12/10/2015 12:49   I have personally reviewed and evaluated these images and lab results as part of my medical decision-making.   EKG Interpretation None      Meds given in ED:  Medications  amoxicillin-clavulanate (AUGMENTIN) 875-125 MG per tablet 1 tablet (1 tablet Oral Given 12/10/15 1533)    Discharge Medication List as of 12/10/2015  3:12 PM    START taking these medications   Details  !! Acetaminophen-Codeine (TYLENOL/CODEINE #3) 300-30 MG tablet Take 1-2 tablets by mouth every 4 (four) hours as needed for pain., Starting 12/10/2015, Until Discontinued, Print    amoxicillin-clavulanate (AUGMENTIN) 875-125 MG tablet Take 1 tablet by mouth 2 (two) times daily. One po bid x 7 days, Starting 12/10/2015, Until Discontinued, Print      !! - Potential duplicate medications found. Please discuss with provider.       MDM   Final diagnoses:  Fracture of great toe, right, closed, initial encounter  Nail avulsion of toe, initial encounter   82 year old female who presents with a right toe injury. Xray shows evidence of a fracture of the distal phalanx. She is NAD and pain-free - most likely due to her neuropathy. Strongly advised close follow up as this will take a long time to heal and she has multiple risk factors (DM, PAD, etc). Up to date on tetanus. Bleeding is controlled.  Shared visit with Dr. Donnald Garre who feels comfortable d/c'ing patient with a course of antibiotics. Pt placed in post-op shoe.    Bethel Born, PA-C 12/11/15 1231  Arby Barrette, MD 12/13/15 [phone]

## 2015-12-12 ENCOUNTER — Telehealth (HOSPITAL_COMMUNITY): Payer: Self-pay | Admitting: Vascular Surgery

## 2015-12-12 NOTE — Telephone Encounter (Signed)
Left pt message to reschedule appt  

## 2015-12-14 ENCOUNTER — Encounter: Payer: Self-pay | Admitting: Internal Medicine

## 2015-12-14 ENCOUNTER — Ambulatory Visit (INDEPENDENT_AMBULATORY_CARE_PROVIDER_SITE_OTHER): Payer: Medicare Other | Admitting: Internal Medicine

## 2015-12-14 ENCOUNTER — Encounter (HOSPITAL_COMMUNITY): Payer: Medicare Other

## 2015-12-14 VITALS — BP 112/70 | HR 62 | Temp 98.1°F

## 2015-12-14 DIAGNOSIS — S92401D Displaced unspecified fracture of right great toe, subsequent encounter for fracture with routine healing: Secondary | ICD-10-CM | POA: Diagnosis not present

## 2015-12-14 DIAGNOSIS — X58XXXD Exposure to other specified factors, subsequent encounter: Secondary | ICD-10-CM

## 2015-12-14 DIAGNOSIS — I10 Essential (primary) hypertension: Secondary | ICD-10-CM | POA: Diagnosis not present

## 2015-12-14 MED ORDER — AMOXICILLIN-POT CLAVULANATE 875-125 MG PO TABS: 1.0000 | ORAL_TABLET | Freq: Two times a day (BID) | ORAL | Status: DC

## 2015-12-14 NOTE — Assessment & Plan Note (Addendum)
Pt presents for ED f/u of a fracture of the right great toe on 4/24.  She was also given augmentin.  However, she only took it for about 1-2 days because she misplaced it.  She has DM, neuropathy, severe PAD, CHF.  On exam, her right great toenail is completely avulsed and there is bright red blood on the nail bed surrounded by vaseline.  She has good movement of the toe.   -resend prescription for augmentin to the pharmacy  -return precautions given  -f/u in 3-4 weeks

## 2015-12-14 NOTE — Patient Instructions (Signed)
Thank you for your visit today.   Please return to the internal medicine clinic in about 3-4 weeks or sooner if needed.     I have made the following additions/changes to your medications:  I have sent the antibiotic, augmentin, to your pharmacy.  Please take every 12 hours for 7 days. Please return immediately if you observe signs of infection including increasing pain, swelling, warmth, redness, fever, chills, nausea, vomiting, or a change in her mental status.    Please be sure to bring all of your medications with you to every visit; this includes herbal supplements, vitamins, eye drops, and any over-the-counter medications.   Should you have any questions regarding your medications and/or any new or worsening symptoms, please be sure to call the clinic at [phone].   If you believe that you are suffering from a life threatening condition or one that may result in the loss of limb or function, then you should call 911 and proceed to the nearest Emergency Department.   Nail Avulsion Injury Nail avulsion means that you have lost the whole, or part of a nail. The nail will usually grow back in 2 to 6 months. If your injury damaged the growth center of the nail, the nail may be deformed, split, or not stuck to the nail bed. Sometimes the avulsed nail is stitched back in place. This provides temporary protection to the nail bed until the new nail grows in.  HOME CARE INSTRUCTIONS   Raise (elevate) your injury as much as possible.  Protect the injury and cover it with bandages (dressings) or splints as instructed.  Change dressings as instructed. SEEK MEDICAL CARE IF:   There is increasing pain, redness, or swelling.  You cannot move your fingers or toes.   This information is not intended to replace advice given to you by your health care provider. Make sure you discuss any questions you have with your health care provider.   Document Released: 09/11/2004 Document Revised:  10/27/2011 Document Reviewed: 07/06/2009 Elsevier Interactive Patient Education [employer]

## 2015-12-14 NOTE — Assessment & Plan Note (Signed)
BP 112/70, HR 62.   -cont current meds

## 2015-12-14 NOTE — Progress Notes (Signed)
Patient ID: Graciemae Delisle, female   DOB: November 22, 1925, 82 y.o.   MRN: [id number]     Subjective:   Patient ID: Kaycee Haycraft female    DOB: Oct 23, 1925 82 y.o.    MRN: [id number] Health Maintenance Due: Health Maintenance Due  Topic Date Due  . ZOSTAVAX  12/03/1987  . PNA vac Low Risk Adult (2 of 2 - PCV13) 05/05/2003  . FOOT EXAM  08/25/2014  . OPHTHALMOLOGY EXAM  09/08/2014  . LIPID PANEL  04/07/2015    _________________________________________________  HPI: Ms.Domini Mallery is a 82 y.o. female here for ED f/u visit for a right toe injury 4/24.  Pt has a PMH outlined below.  Please see problem-based charting assessment and plan for further status of patient's chronic medical problems addressed at today's visit.  PMH: Past Medical History  Diagnosis Date  . Hypertension   . Asthma   . Osteoarthritis     left shoulder  . Pseudogout   . Hypokalemia     a. Previously felt due to diuretics, req supplementation.  . Churg-Strauss syndrome (HCC)     a. Sural nerve biopsy 02/2000.  Darene Lamer disease     a. s/p thyroidectomy.  . Glaucoma   . Hypercholesteremia   . Venous insufficiency   . Osteopenia     DEXA 2004  . Lipoma     left inner thigh  . Cataract     left eye  . VASCULITIS   . Chronic combined systolic and diastolic heart failure (HCC)     a. 02/2012 EF:20-25%  . Pleural effusion, left     a. Thoracentesis 2001 - per notes, no malignant cells, was transudative.  Marland Kitchen NSVT (nonsustained ventricular tachycardia) (HCC)     a. During CHF adm 2001.  . Multifocal atrial tachycardia (HCC)     a. Documented as OP 01/2012.  Marland Kitchen Ectopic atrial tachycardia (HCC)     a. Documented on tele as IP 02/2012.  . Pulmonary HTN (HCC)     a. Mod by echo 02/2012.  . Valvular heart disease     a. Echo 02/2012: mod MR/TR, mild AI.  Marland Kitchen Pericardial effusion     a. Echo 02/2012: small-mod pericardial effusion.  . Atrial fibrillation, permanent (HCC)     a. Dx 04/2013, on metoprolol, dig and xarelto    . Shortness of breath   . CHF (congestive heart failure) (HCC)   . On home oxygen therapy     "wear it mostly at night" (09/20/2014)  . Pneumonia 1930's  . Type II diabetes mellitus (HCC)     Medications: Current Outpatient Prescriptions on File Prior to Visit  Medication Sig Dispense Refill  . acetaminophen-codeine (TYLENOL #3) 300-30 MG tablet Take 1-2 tablets by mouth every 8 (eight) hours as needed (for pain). 45 tablet 1  . Acetaminophen-Codeine (TYLENOL/CODEINE #3) 300-30 MG tablet Take 1-2 tablets by mouth every 4 (four) hours as needed for pain. 30 tablet 0  . albuterol (PROVENTIL HFA;VENTOLIN HFA) 108 (90 Base) MCG/ACT inhaler Inhale 2 puffs into the lungs every 6 (six) hours as needed for wheezing or shortness of breath. 1 Inhaler 2  . BD ULTRA-FINE LANCETS lancets Use to check blood sugar one time daily 100 each 5  . brimonidine (ALPHAGAN P) 0.1 % SOLN Place 1 drop into the left eye every 12 (twelve) hours. 45 mL 0  . dorzolamide (TRUSOPT) 2 % ophthalmic solution Place 1 drop into the left eye 2 (two) times daily. 10 mL 3  .  DULERA 100-5 MCG/ACT AERO INHALE 2 PUFFS INTO THE LUNGS TWICE DAILY 13 g 3  . ferrous sulfate 325 (65 FE) MG tablet TAKE 1 TABLET(325 MG) BY MOUTH DAILY 30 tablet 3  . furosemide (LASIX) 80 MG tablet Take 80 mg by mouth. Take 1 tab if weight 151 lb or greater    . glucose blood test strip Use as instructed 100 each 12  . lisinopril (PRINIVIL,ZESTRIL) 20 MG tablet Take 1 tablet (20 mg total) by mouth daily. 30 tablet 3  . metFORMIN (GLUCOPHAGE) 1000 MG tablet TAKE 1 TABLET(1000 MG) BY MOUTH TWICE DAILY WITH A MEAL 180 tablet 1  . metoprolol succinate (TOPROL-XL) 50 MG 24 hr tablet TAKE 2 TABLETS BY MOUTH EVERY MORNING AND 1 TABLET EVERY EVENING 90 tablet 3  . potassium chloride SA (K-DUR,KLOR-CON) 20 MEQ tablet Take 2 tablets (40 mEq total) by mouth daily. Only take this medication on days you take Lasix. 60 tablet 0  . ranitidine (ZANTAC) 75 MG tablet Take 1  tablet (75 mg total) by mouth 2 (two) times daily. (Patient taking differently: Take 75 mg by mouth daily as needed for heartburn. ) 60 tablet 0  . spironolactone (ALDACTONE) 25 MG tablet TAKE 1 TABLET BY MOUTH EVERY DAY 90 tablet 1  . XARELTO 20 MG TABS tablet TAKE 1 TABLET BY MOUTH DAILY WITH DINNER (Patient taking differently: TAKE 1 TABLET BY MOUTH EVERY MORNING) 30 tablet 11   No current facility-administered medications on file prior to visit.    Allergies: Allergies  Allergen Reactions  . Amiodarone Other (See Comments)    Contributed to hypoxia    FH: Family History  Problem Relation Age of Onset  . Diabetes Mother   . Diabetes Father   . Heart disease Neg Hx     SH: Social History   Social History  . Marital Status: Widowed    Spouse Name: N/A  . Number of Children: N/A  . Years of Education: N/A   Occupational History  . Retired    Social History Main Topics  . Smoking status: Never Smoker   . Smokeless tobacco: Never Used  . Alcohol Use: No  . Drug Use: No  . Sexual Activity: Not Asked   Other Topics Concern  . None   Social History Narrative   Lives in house, with family just next door.    Never smoker, no alcohol.      Used to work at [employer].    Before that, worked in food prep.                    Review of Systems: Constitutional: Negative for fever, chills.  Eyes: Negative for blurred vision.  Respiratory: Negative for cough and shortness of breath.  Cardiovascular: Negative for chest pain.  Gastrointestinal: Negative for nausea, vomiting. Neurological: Negative for dizziness.   Objective:   Vital Signs: Filed Vitals:   12/14/15 1410  BP: 112/70  Pulse: 62  Temp: 98.1 F (36.7 C)  TempSrc: Oral  SpO2: 96%      BP Readings from Last 3 Encounters:  12/14/15 112/70  12/10/15 131/86  09/14/15 170/96    Physical Exam: Constitutional: Vital signs reviewed.  Patient is in NAD and cooperative with exam.    Head: Normocephalic and atraumatic. Eyes: EOMI, conjunctivae nl, no scleral icterus.  Neck: Supple. Cardiovascular: Irregular rhythm, murmur noted.  Pulmonary/Chest: Normal effort, CTAB. Extremities: Right great toe nail avulsion surrounded by bright red blood.  No  drainage; she has normal ROM without pain.  Decreased DP.     Assessment & Plan:   Assessment and plan was discussed and formulated with my attending.

## 2015-12-17 NOTE — Progress Notes (Signed)
Patient ID: [name], female   DOB: Aug 31, 1925, 82 y.o.   MRN: [id number]    Advanced Heart Failure Clinic Note   PCP: Dr [name]  HPI: [name] is an 82 year old with a history of HTN, DM2, Asthma, Churg-Strauss syndrome,chronic atrial fibrillation on xarelto, PE, night time oxygen,and chronic systolic HF EF 20-25% (nonischemic cardiomyopathy). She uses home oxygen with exertion and at night.   Admitted in early February 2016 with hypoxia and volume overload. Diuresed with IV lasix and transitioned to po lasix 120 mg twice a day + metolazone every Friday. Discharge weight was 165 pounds. AHC to follow.   She had an episode of significant hemoptysis on 4/8 and went to ER.  They could not get an IV so CT was not done.  She was continued on Xarelto and has had no further hemoptysis.    She presents today for regular up. Has been since August since we saw last. Broke her foot last week, so not getting around much. Prior to hurting her foot was getting around the house better with less SOB. No lightheadedness or dizziness. Took lasix 4-5 days last week with lots of edema. No Lasix for 2 months and weight is stable. No BRBPR or melena.    Labs (4/16): K 3.8, creatinine 0.73, BNP 993, HCT 36.6 Labs (8/16): K 3.7, creatinine 0.66, BNP 423.8  ROS: All systems negative except as listed in HPI, PMH and Problem List.  SH:  Social History   Social History  . Marital Status: Widowed    Spouse Name: N/A  . Number of Children: N/A  . Years of Education: N/A   Occupational History  . Retired    Social History Main Topics  . Smoking status: Never Smoker   . Smokeless tobacco: Never Used  . Alcohol Use: No  . Drug Use: No  . Sexual Activity: Not on file   Other Topics Concern  . Not on file   Social History Narrative   Lives in house, with family just next door.    Never smoker, no alcohol.      Used to work at [employer].    Before that, worked in food prep.                      FH:  Family History  Problem Relation Age of Onset  . Diabetes Mother   . Diabetes Father   . Heart disease Neg Hx     Past Medical History  Diagnosis Date  . Hypertension   . Asthma   . Osteoarthritis     left shoulder  . Pseudogout   . Hypokalemia     a. Previously felt due to diuretics, req supplementation.  . Churg-Strauss syndrome (HCC)     a. Sural nerve biopsy 02/2000.  [name] disease     a. s/p thyroidectomy.  . Glaucoma   . Hypercholesteremia   . Venous insufficiency   . Osteopenia     DEXA 2004  . Lipoma     left inner thigh  . Cataract     left eye  . VASCULITIS   . Chronic combined systolic and diastolic heart failure (HCC)     a. 02/2012 EF:20-25%  . Pleural effusion, left     a. Thoracentesis 2001 - per notes, no malignant cells, was transudative.  Marland Kitchen NSVT (nonsustained ventricular tachycardia) (HCC)     a. During CHF adm 2001.  . Multifocal atrial  tachycardia (HCC)     a. Documented as OP 01/2012.  Marland Kitchen Ectopic atrial tachycardia (HCC)     a. Documented on tele as IP 02/2012.  . Pulmonary HTN (HCC)     a. Mod by echo 02/2012.  . Valvular heart disease     a. Echo 02/2012: mod MR/TR, mild AI.  Marland Kitchen Pericardial effusion     a. Echo 02/2012: small-mod pericardial effusion.  . Atrial fibrillation, permanent (HCC)     a. Dx 04/2013, on metoprolol, dig and xarelto  . Shortness of breath   . CHF (congestive heart failure) (HCC)   . On home oxygen therapy     "wear it mostly at night" (09/20/2014)  . Pneumonia 1930's  . Type II diabetes mellitus (HCC)     Current Outpatient Prescriptions  Medication Sig Dispense Refill  . acetaminophen-codeine (TYLENOL #3) 300-30 MG tablet Take 1-2 tablets by mouth every 8 (eight) hours as needed (for pain). 45 tablet 1  . albuterol (PROVENTIL HFA;VENTOLIN HFA) 108 (90 Base) MCG/ACT inhaler Inhale 2 puffs into the lungs every 6 (six) hours as needed for wheezing or shortness of breath. 1 Inhaler 2  .  amoxicillin-clavulanate (AUGMENTIN) 875-125 MG tablet Take 1 tablet by mouth every 12 (twelve) hours. 14 tablet 0  . BD ULTRA-FINE LANCETS lancets Use to check blood sugar one time daily 100 each 5  . brimonidine (ALPHAGAN P) 0.1 % SOLN Place 1 drop into the left eye every 12 (twelve) hours. 45 mL 0  . dorzolamide (TRUSOPT) 2 % ophthalmic solution Place 1 drop into the left eye 2 (two) times daily. 10 mL 3  . DULERA 100-5 MCG/ACT AERO INHALE 2 PUFFS INTO THE LUNGS TWICE DAILY 13 g 3  . ferrous sulfate 325 (65 FE) MG tablet TAKE 1 TABLET(325 MG) BY MOUTH DAILY 30 tablet 3  . furosemide (LASIX) 80 MG tablet Take 80 mg by mouth daily.     Marland Kitchen glucose blood test strip Use as instructed 100 each 12  . lisinopril (PRINIVIL,ZESTRIL) 20 MG tablet Take 1 tablet (20 mg total) by mouth daily. 30 tablet 3  . metFORMIN (GLUCOPHAGE) 1000 MG tablet TAKE 1 TABLET(1000 MG) BY MOUTH TWICE DAILY WITH A MEAL 180 tablet 1  . metoprolol succinate (TOPROL-XL) 50 MG 24 hr tablet TAKE 2 TABLETS BY MOUTH EVERY MORNING AND 1 TABLET EVERY EVENING 90 tablet 3  . potassium chloride SA (K-DUR,KLOR-CON) 20 MEQ tablet Take 2 tablets (40 mEq total) by mouth daily. Only take this medication on days you take Lasix. 60 tablet 0  . ranitidine (ZANTAC) 75 MG tablet Take 75 mg by mouth daily as needed for heartburn.    . rivaroxaban (XARELTO) 20 MG TABS tablet Take 20 mg by mouth daily with supper.    Marland Kitchen spironolactone (ALDACTONE) 25 MG tablet TAKE 1 TABLET BY MOUTH EVERY DAY 90 tablet 1   No current facility-administered medications for this encounter.    Filed Vitals:   12/18/15 1406  BP: 106/62  Pulse: 59  SpO2: 93%  No weight to broken toe, unable to stand.    Physical Exam:  General: Elderly, in WC. NAD HEENT: normal  Neck: supple. JVP at clavicle (5-6 cm).  Carotids 2+ bilat; no bruits. No thyromegaly or nodule noted. Cor: PMI nondisplaced. Irregular rate & rhythm. 2/6 MR/TR apex/LLSB.  Unable to palpate PT pulses  bilaterally.  Lungs: dimished but clear. On 2 liters nasal cannula.  Abdomen: soft, NT, ND, no HSM. No bruits or masses. +  BS  Extremities: no cyanosis, clubbing, rash.  Trace ankle edema.  Neuro: alert & orientedx3, cranial nerves grossly intact. moves all 4 extremities w/o difficulty. Affect flat  EKG: Afib rate of 115 bpm, LAD, old inferior and anterolateral infarcts  ASSESSMENT & PLAN: 1. Chronic systolic CHF: Nonischemic cardiomyopathy.  Echo 09/21/2014 EF 20-25% with moderate to severe RV dysfunction.  NYHA III.   - Volume status stable on exam after multiple days of prn lasix last week.    - Continue lisinopril 20 mg daily - Continue Lasix 80 mg prn weight gain. Be sure to take 40 meq potassium on these days. - BMET/BNP today. 2. Chronic atrial fibrillation:  CHADSVASC = 6. - Rate slightly elevated currently on Toprol XL 100 mg in am and 50 mg in pm. On Xarelto 20 mg daily.  Foot is bothering her a lot, so will keep this stable for now.  Knows to call with symptoms ( CP or worsening SOB) - Repeat CBC today.  3. H/O PE: On xarelto 20 mg daily  4. Lung nodule: RUL.  - Resolved on repeat CT 04/2015.  5. Leg pain: Mainly on left.   - VAS [place] 04/09/15 - Pt has significant below-knee PAD bilaterally. Would likely not intervene in absence of critical limb ischemia 6. Fractured R toe. - Unable to get weight today.  Daughter said she was considering PT afterward but unsure. Encouraged her to pursue.   Followup in 4-6 weeks with Dr. Shirlee Latch. BMET/BNP/CBC today.  No med changes with soft BP and fall risk.    Graciella Freer, PA-C 12/18/2015

## 2015-12-17 NOTE — Progress Notes (Signed)
Internal Medicine Clinic Attending  Case discussed with Dr. Gill soon after the resident saw the patient.  We reviewed the resident's history and exam and pertinent patient test results.  I agree with the assessment, diagnosis, and plan of care documented in the resident's note.  

## 2015-12-18 ENCOUNTER — Ambulatory Visit (HOSPITAL_COMMUNITY)
Admission: RE | Admit: 2015-12-18 | Discharge: 2015-12-18 | Disposition: A | Discharge status: Home / Self Care | Payer: Medicare Other | Source: Ambulatory Visit | Attending: Internal Medicine | Admitting: Internal Medicine

## 2015-12-18 VITALS — BP 106/62 | HR 59

## 2015-12-18 DIAGNOSIS — I872 Venous insufficiency (chronic) (peripheral): Secondary | ICD-10-CM | POA: Insufficient documentation

## 2015-12-18 DIAGNOSIS — I5042 Chronic combined systolic (congestive) and diastolic (congestive) heart failure: Secondary | ICD-10-CM | POA: Diagnosis not present

## 2015-12-18 DIAGNOSIS — I110 Hypertensive heart disease with heart failure: Secondary | ICD-10-CM | POA: Diagnosis not present

## 2015-12-18 DIAGNOSIS — M8580 Other specified disorders of bone density and structure, unspecified site: Secondary | ICD-10-CM | POA: Diagnosis not present

## 2015-12-18 DIAGNOSIS — I482 Chronic atrial fibrillation, unspecified: Secondary | ICD-10-CM

## 2015-12-18 DIAGNOSIS — Z9981 Dependence on supplemental oxygen: Secondary | ICD-10-CM | POA: Diagnosis not present

## 2015-12-18 DIAGNOSIS — I5022 Chronic systolic (congestive) heart failure: Secondary | ICD-10-CM | POA: Diagnosis not present

## 2015-12-18 DIAGNOSIS — Z86711 Personal history of pulmonary embolism: Secondary | ICD-10-CM | POA: Insufficient documentation

## 2015-12-18 DIAGNOSIS — M79605 Pain in left leg: Secondary | ICD-10-CM | POA: Insufficient documentation

## 2015-12-18 DIAGNOSIS — R911 Solitary pulmonary nodule: Secondary | ICD-10-CM | POA: Diagnosis not present

## 2015-12-18 DIAGNOSIS — I272 Other secondary pulmonary hypertension: Secondary | ICD-10-CM | POA: Insufficient documentation

## 2015-12-18 DIAGNOSIS — I10 Essential (primary) hypertension: Secondary | ICD-10-CM

## 2015-12-18 DIAGNOSIS — E119 Type 2 diabetes mellitus without complications: Secondary | ICD-10-CM | POA: Diagnosis not present

## 2015-12-18 DIAGNOSIS — Z7901 Long term (current) use of anticoagulants: Secondary | ICD-10-CM

## 2015-12-18 DIAGNOSIS — J45909 Unspecified asthma, uncomplicated: Secondary | ICD-10-CM | POA: Insufficient documentation

## 2015-12-18 DIAGNOSIS — E7800 Pure hypercholesterolemia, unspecified: Secondary | ICD-10-CM | POA: Insufficient documentation

## 2015-12-18 DIAGNOSIS — S92401D Displaced unspecified fracture of right great toe, subsequent encounter for fracture with routine healing: Secondary | ICD-10-CM

## 2015-12-18 DIAGNOSIS — H409 Unspecified glaucoma: Secondary | ICD-10-CM | POA: Diagnosis not present

## 2015-12-18 DIAGNOSIS — I5023 Acute on chronic systolic (congestive) heart failure: Secondary | ICD-10-CM

## 2015-12-18 DIAGNOSIS — Z833 Family history of diabetes mellitus: Secondary | ICD-10-CM | POA: Insufficient documentation

## 2015-12-18 DIAGNOSIS — Z79899 Other long term (current) drug therapy: Secondary | ICD-10-CM | POA: Insufficient documentation

## 2015-12-18 DIAGNOSIS — Z7984 Long term (current) use of oral hypoglycemic drugs: Secondary | ICD-10-CM | POA: Diagnosis not present

## 2015-12-18 DIAGNOSIS — I428 Other cardiomyopathies: Secondary | ICD-10-CM | POA: Diagnosis not present

## 2015-12-18 LAB — CBC
HEMATOCRIT: 49.1 % — AB (ref 36.0–46.0)
HEMOGLOBIN: 15.6 g/dL — AB (ref 12.0–15.0)
MCH: 30.7 pg (ref 26.0–34.0)
MCHC: 31.8 g/dL (ref 30.0–36.0)
MCV: 96.7 fL (ref 78.0–100.0)
PLATELETS: 358 10*3/uL (ref 150–400)
RBC: 5.08 MIL/uL (ref 3.87–5.11)
RDW: 14.5 % (ref 11.5–15.5)
WBC: 6.1 10*3/uL (ref 4.0–10.5)

## 2015-12-18 LAB — BASIC METABOLIC PANEL
ANION GAP: 20 — AB (ref 5–15)
BUN: 23 mg/dL — AB (ref 6–20)
CHLORIDE: 105 mmol/L (ref 101–111)
CO2: 17 mmol/L — ABNORMAL LOW (ref 22–32)
Calcium: 9.4 mg/dL (ref 8.9–10.3)
Creatinine, Ser: 0.84 mg/dL (ref 0.44–1.00)
GFR calc Af Amer: >60 mL/min (ref >60)
GLUCOSE: 165 mg/dL — AB (ref 65–99)
POTASSIUM: 4.5 mmol/L (ref 3.5–5.1)
Sodium: 142 mmol/L (ref 135–145)

## 2015-12-18 LAB — BRAIN NATRIURETIC PEPTIDE: B Natriuretic Peptide: 406 pg/mL — ABNORMAL HIGH (ref 0.0–100.0)

## 2015-12-18 NOTE — Progress Notes (Signed)
Advanced Heart Failure Medication Review by a Pharmacist  Does the patient  feel that his/her medications are working for him/her?  yes  Has the patient been experiencing any side effects to the medications prescribed?  no  Does the patient measure his/her own blood pressure or blood glucose at home?  yes   Does the patient have any problems obtaining medications due to transportation or finances?   no  Understanding of regimen: good Understanding of indications: good Potential of compliance: good Patient understands to avoid NSAIDs. Patient understands to avoid decongestants.  Issues to address at subsequent visits: None   Pharmacist comments:  [name] is a pleasant 82 yo F presenting with her daughter who manages her medications. She had recently been taking lasix 80 mg daily until last Sunday 4/30 when she was told to stop. They report good compliance with her regimen and state that when she was on once weekly metolazone it seemed that this was more helpful for her edema. They did not have any other medication-related questions or concerns for me at this time.   Tyler Deis. Bonnye Fava, PharmD, BCPS, CPP Clinical Pharmacist Pager: [phone] Phone: [phone] 12/18/2015 2:27 PM      Time with patient: 10 minutes Preparation and documentation time: 2 minutes Total time: 12 minutes

## 2015-12-18 NOTE — Patient Instructions (Signed)
Labs today  Your physician recommends that you schedule a follow-up appointment in: 4-6 weeks with Dr McLean  Do the following things EVERYDAY: 1) Weigh yourself in the morning before breakfast. Write it down and keep it in a log. 2) Take your medicines as prescribed 3) Eat low salt foods-Limit salt (sodium) to 2000 mg per day.  4) Stay as active as you can everyday 5) Limit all fluids for the day to less than 2 liters 6)   

## 2015-12-25 DIAGNOSIS — I509 Heart failure, unspecified: Secondary | ICD-10-CM | POA: Diagnosis not present

## 2015-12-31 ENCOUNTER — Telehealth: Payer: Self-pay | Admitting: *Deleted

## 2015-12-31 NOTE — Telephone Encounter (Signed)
Received PA request from pt's pharmacy .  Request submitted online via Cover My Meds-request approve, pharamacy aware.Kingsley Spittle Cassady5/15/20171:56 PM

## 2016-01-15 ENCOUNTER — Telehealth: Payer: Self-pay | Admitting: Pulmonary Disease

## 2016-01-15 NOTE — Telephone Encounter (Signed)
   Reason for call:   I received a call from [place] on [name]'s behalf at 5:49 PM indicating she is having foot pain.   Pertinent Data:   Similar to right toe injury late April. Had injury again this time  Wants tylenol #3 refill   Assessment / Plan / Recommendations:   Discussed that she is due for follow up based on last clinic note.   Will forward this note to her PCP and triage. [name] can be reached at [phone].  As always, pt is advised that if symptoms worsen or new symptoms arise, they should go to an urgent care facility or to ER for further evaluation.   Lora Paula, MD   01/15/2016, 5:49 PM

## 2016-01-16 NOTE — Telephone Encounter (Signed)
Have called and gotten no answer

## 2016-01-21 ENCOUNTER — Other Ambulatory Visit: Payer: Self-pay | Admitting: Internal Medicine

## 2016-01-22 ENCOUNTER — Other Ambulatory Visit: Payer: Self-pay | Admitting: Internal Medicine

## 2016-01-22 ENCOUNTER — Ambulatory Visit (HOSPITAL_COMMUNITY)
Admission: RE | Admit: 2016-01-22 | Discharge: 2016-01-22 | Disposition: A | Discharge status: Home / Self Care | Payer: Medicare Other | Source: Ambulatory Visit | Attending: Cardiology | Admitting: Cardiology

## 2016-01-22 ENCOUNTER — Encounter (HOSPITAL_COMMUNITY): Payer: Self-pay

## 2016-01-22 VITALS — BP 112/72 | HR 133 | Wt 149.5 lb

## 2016-01-22 DIAGNOSIS — I428 Other cardiomyopathies: Secondary | ICD-10-CM | POA: Insufficient documentation

## 2016-01-22 DIAGNOSIS — E1151 Type 2 diabetes mellitus with diabetic peripheral angiopathy without gangrene: Secondary | ICD-10-CM | POA: Insufficient documentation

## 2016-01-22 DIAGNOSIS — J45909 Unspecified asthma, uncomplicated: Secondary | ICD-10-CM | POA: Diagnosis not present

## 2016-01-22 DIAGNOSIS — Z833 Family history of diabetes mellitus: Secondary | ICD-10-CM | POA: Insufficient documentation

## 2016-01-22 DIAGNOSIS — Z9981 Dependence on supplemental oxygen: Secondary | ICD-10-CM | POA: Insufficient documentation

## 2016-01-22 DIAGNOSIS — I872 Venous insufficiency (chronic) (peripheral): Secondary | ICD-10-CM | POA: Diagnosis not present

## 2016-01-22 DIAGNOSIS — I482 Chronic atrial fibrillation, unspecified: Secondary | ICD-10-CM

## 2016-01-22 DIAGNOSIS — I110 Hypertensive heart disease with heart failure: Secondary | ICD-10-CM | POA: Insufficient documentation

## 2016-01-22 DIAGNOSIS — Z7901 Long term (current) use of anticoagulants: Secondary | ICD-10-CM | POA: Diagnosis not present

## 2016-01-22 DIAGNOSIS — E7800 Pure hypercholesterolemia, unspecified: Secondary | ICD-10-CM | POA: Insufficient documentation

## 2016-01-22 DIAGNOSIS — I5042 Chronic combined systolic (congestive) and diastolic (congestive) heart failure: Secondary | ICD-10-CM

## 2016-01-22 DIAGNOSIS — Z79899 Other long term (current) drug therapy: Secondary | ICD-10-CM | POA: Insufficient documentation

## 2016-01-22 DIAGNOSIS — Z86711 Personal history of pulmonary embolism: Secondary | ICD-10-CM | POA: Insufficient documentation

## 2016-01-22 DIAGNOSIS — Z7984 Long term (current) use of oral hypoglycemic drugs: Secondary | ICD-10-CM | POA: Insufficient documentation

## 2016-01-22 DIAGNOSIS — I5022 Chronic systolic (congestive) heart failure: Secondary | ICD-10-CM | POA: Insufficient documentation

## 2016-01-22 DIAGNOSIS — I739 Peripheral vascular disease, unspecified: Secondary | ICD-10-CM

## 2016-01-22 LAB — BASIC METABOLIC PANEL
Anion gap: 10 (ref 5–15)
BUN: 15 mg/dL (ref 6–20)
CHLORIDE: 101 mmol/L (ref 101–111)
CO2: 25 mmol/L (ref 22–32)
CREATININE: 1.17 mg/dL — AB (ref 0.44–1.00)
Calcium: 8.4 mg/dL — ABNORMAL LOW (ref 8.9–10.3)
GFR calc non Af Amer: 40 mL/min — ABNORMAL LOW (ref >60)
GFR, EST AFRICAN AMERICAN: 47 mL/min — AB (ref >60)
Glucose, Bld: 150 mg/dL — ABNORMAL HIGH (ref 65–99)
POTASSIUM: 3.7 mmol/L (ref 3.5–5.1)
Sodium: 136 mmol/L (ref 135–145)

## 2016-01-22 LAB — CBC
HCT: 38.1 % (ref 36.0–46.0)
HEMOGLOBIN: 12.1 g/dL (ref 12.0–15.0)
MCH: 29.4 pg (ref 26.0–34.0)
MCHC: 31.8 g/dL (ref 30.0–36.0)
MCV: 92.5 fL (ref 78.0–100.0)
Platelets: 294 10*3/uL (ref 150–400)
RBC: 4.12 MIL/uL (ref 3.87–5.11)
RDW: 14.7 % (ref 11.5–15.5)
WBC: 4 10*3/uL (ref 4.0–10.5)

## 2016-01-22 LAB — BRAIN NATRIURETIC PEPTIDE: B NATRIURETIC PEPTIDE 5: 805.5 pg/mL — AB (ref 0.0–100.0)

## 2016-01-22 MED ORDER — METOPROLOL SUCCINATE ER 100 MG PO TB24: ORAL_TABLET | ORAL | Status: DC

## 2016-01-22 MED ORDER — FUROSEMIDE 80 MG PO TABS: ORAL_TABLET | ORAL | Status: DC

## 2016-01-22 NOTE — Patient Instructions (Signed)
Increase Metoprolol to 100 mg twice daily.  Increase Lasix to 80mg  in the AM and 40mg  in the PM.  Routine lab work today. Will notify you of abnormal results  Follow up in 1 week

## 2016-01-23 NOTE — Progress Notes (Signed)
Patient ID: [name], female   DOB: 1926/02/23, 82 y.o.   MRN: [id number]    Advanced Heart Failure Clinic Note   PCP: Dr Evelena Peat  HPI: [name] is an 82 year old with a history of HTN, DM2, Asthma, Churg-Strauss syndrome,chronic atrial fibrillation on Xarelto, PE, night time oxygen,and chronic systolic HF EF 20-25% (nonischemic cardiomyopathy). She uses home oxygen with exertion and at night.   Admitted in early February 2016 with hypoxia and volume overload. Diuresed with IV lasix and transitioned to po lasix 120 mg twice a day + metolazone every Friday. Discharge weight was 165 pounds. AHC to follow.   Still minimally active due to pain in her foot (s/p fracture).  Not wearing oxygen today, 86% on room air.  Per family, she has been doing pretty well.  Legs not swelling as much as in the past.  Weight has been down at home.  However, her HR is elevated today, 120s at rest.  She has been taking Lasix 80 mg daily. Marland Kitchen No BRBPR or melena.    Labs (4/16): K 3.8, creatinine 0.73, BNP 993, HCT 36.6 Labs (8/16): K 3.7, creatinine 0.66, BNP 423.8 Labs (5/17): BNP 406, K 4.5, creatinine 0.84  ECG: atrial fibrillation at 125, LAFB, anteroseptal and inferior Qs.   ROS: All systems negative except as listed in HPI, PMH and Problem List.  SH:  Social History   Social History  . Marital Status: Widowed    Spouse Name: N/A  . Number of Children: N/A  . Years of Education: N/A   Occupational History  . Retired    Social History Main Topics  . Smoking status: Never Smoker   . Smokeless tobacco: Never Used  . Alcohol Use: No  . Drug Use: No  . Sexual Activity: Not on file   Other Topics Concern  . Not on file   Social History Narrative   Lives in house, with family just next door.    Never smoker, no alcohol.      Used to work at [employer].    Before that, worked in food prep.                    FH:  Family History  Problem Relation Age of Onset  .  Diabetes Mother   . Diabetes Father   . Heart disease Neg Hx     Past Medical History  Diagnosis Date  . Hypertension   . Asthma   . Osteoarthritis     left shoulder  . Pseudogout   . Hypokalemia     a. Previously felt due to diuretics, req supplementation.  . Churg-Strauss syndrome (HCC)     a. Sural nerve biopsy 02/2000.  Darene Lamer disease     a. s/p thyroidectomy.  . Glaucoma   . Hypercholesteremia   . Venous insufficiency   . Osteopenia     DEXA 2004  . Lipoma     left inner thigh  . Cataract     left eye  . VASCULITIS   . Chronic combined systolic and diastolic heart failure (HCC)     a. 02/2012 EF:20-25%  . Pleural effusion, left     a. Thoracentesis 2001 - per notes, no malignant cells, was transudative.  Marland Kitchen NSVT (nonsustained ventricular tachycardia) (HCC)     a. During CHF adm 2001.  . Multifocal atrial tachycardia (HCC)     a. Documented as OP 01/2012.  Marland Kitchen Ectopic atrial tachycardia (  HCC)     a. Documented on tele as IP 02/2012.  . Pulmonary HTN (HCC)     a. Mod by echo 02/2012.  . Valvular heart disease     a. Echo 02/2012: mod MR/TR, mild AI.  Marland Kitchen Pericardial effusion     a. Echo 02/2012: small-mod pericardial effusion.  . Atrial fibrillation, permanent (HCC)     a. Dx 04/2013, on metoprolol, dig and xarelto  . Shortness of breath   . CHF (congestive heart failure) (HCC)   . On home oxygen therapy     "wear it mostly at night" (09/20/2014)  . Pneumonia 1930's  . Type II diabetes mellitus (HCC)     Current Outpatient Prescriptions  Medication Sig Dispense Refill  . acetaminophen-codeine (TYLENOL #3) 300-30 MG tablet Take 1-2 tablets by mouth every 8 (eight) hours as needed (for pain). 45 tablet 1  . albuterol (PROVENTIL HFA;VENTOLIN HFA) 108 (90 Base) MCG/ACT inhaler Inhale 2 puffs into the lungs every 6 (six) hours as needed for wheezing or shortness of breath. 1 Inhaler 2  . BD ULTRA-FINE LANCETS lancets Use to check blood sugar one time daily 100 each 5  .  brimonidine (ALPHAGAN P) 0.1 % SOLN Place 1 drop into the left eye every 12 (twelve) hours. 45 mL 0  . dorzolamide (TRUSOPT) 2 % ophthalmic solution Place 1 drop into the left eye 2 (two) times daily. 10 mL 3  . DULERA 100-5 MCG/ACT AERO INHALE 2 PUFFS INTO THE LUNGS TWICE DAILY 13 g 3  . ferrous sulfate 325 (65 FE) MG tablet TAKE 1 TABLET(325 MG) BY MOUTH DAILY 30 tablet 3  . furosemide (LASIX) 80 MG tablet Take 80mg  in the AM and 40mg  in the PM 45 tablet 3  . glucose blood test strip Use as instructed 100 each 12  . lisinopril (PRINIVIL,ZESTRIL) 20 MG tablet Take 1 tablet (20 mg total) by mouth daily. 30 tablet 3  . metFORMIN (GLUCOPHAGE) 1000 MG tablet TAKE 1 TABLET(1000 MG) BY MOUTH TWICE DAILY WITH A MEAL 180 tablet 1  . metoprolol succinate (TOPROL-XL) 100 MG 24 hr tablet Take 1 tablet twice daily. 60 tablet 3  . potassium chloride SA (K-DUR,KLOR-CON) 20 MEQ tablet Take 2 tablets (40 mEq total) by mouth daily. Only take this medication on days you take Lasix. 60 tablet 0  . ranitidine (ZANTAC) 75 MG tablet Take 75 mg by mouth daily as needed for heartburn.    . rivaroxaban (XARELTO) 20 MG TABS tablet Take 20 mg by mouth daily with supper.    Marland Kitchen spironolactone (ALDACTONE) 25 MG tablet TAKE 1 TABLET BY MOUTH EVERY DAY 90 tablet 1   No current facility-administered medications for this encounter.    Filed Vitals:   01/22/16 1513  BP: 112/72  Pulse: 133  Weight: 149 lb 8 oz (67.813 kg)  SpO2: 91%  No weight to broken toe, unable to stand.    Physical Exam:  General: Elderly, in WC. NAD HEENT: normal  Neck: supple. JVP 8-9 cm.  Carotids 2+ bilat; no bruits. No thyromegaly or nodule noted. Cor: PMI nondisplaced. Irregular rate & rhythm. 2/6 MR/TR apex/LLSB.  Unable to palpate PT pulses bilaterally.  Lungs: dimished but clear. On 2 liters nasal cannula.  Abdomen: soft, NT, ND, no HSM. No bruits or masses. +BS  Extremities: no cyanosis, clubbing, rash.  1+ ankle edema.  Neuro: alert  & orientedx3, cranial nerves grossly intact. moves all 4 extremities w/o difficulty. Affect flat  ASSESSMENT & PLAN:  1. Chronic systolic CHF: Nonischemic cardiomyopathy.  Echo 09/21/2014 EF 20-25% with moderate to severe RV dysfunction.  NYHA III though seems most limited by foot pain.  On exam, she appears volume overloaded.   - Increase Lasix to 80 qam/40 qpm.  BMET/BNP today and repeat in 1 week.    - Continue lisinopril 20 mg daily and spironolactone 25 mg daily.  - Increasing Toprol XL to 100 mg bid with elevated HR.  - Would continue home oxygen at all times for now. 2. Chronic atrial fibrillation:  CHADSVASC = 6.  HR elevated in 120s (was in 110s last visit). - Increase Toprol XL to 100 mg bid. - Continue Xarelto.  3. H/O PE: On Xarelto 20 mg daily  4. PAD: Below knee disease bilaterally.  No ASA given Xarelto use. Will need to address statin at followup => currently not on one.   Marca Ancona 01/23/2016

## 2016-01-25 ENCOUNTER — Other Ambulatory Visit: Payer: Self-pay | Admitting: *Deleted

## 2016-01-25 DIAGNOSIS — I509 Heart failure, unspecified: Secondary | ICD-10-CM | POA: Diagnosis not present

## 2016-01-25 DIAGNOSIS — M79605 Pain in left leg: Secondary | ICD-10-CM

## 2016-01-25 MED ORDER — ACETAMINOPHEN-CODEINE #3 300-30 MG PO TABS
1.0000 | ORAL_TABLET | Freq: Three times a day (TID) | ORAL | Status: DC | PRN
Start: 2016-01-25 — End: 2016-02-12

## 2016-01-25 NOTE — Telephone Encounter (Signed)
Patient's daughter reports a new wound on the right heel "That is the size of the heel", top of the big toe, and the side of the foot. Daughter reports that she noticed these wounds yesterday. Denies any fever or chills, reports small drainage. Daughter is wrapping foot with dry gauze and bandage and using saline to remove the dressing. Requesting pain medicine refill as patient is unable to sleep due to the pain. Added to your schedule for Monday and told her to take her to the ED for any signs of fever, worsening of the wounds, or any other concerns before her appointment. Please advise about wound care for the weekend and if you feel like she needs to go to ED today. Thanks!

## 2016-01-25 NOTE — Telephone Encounter (Signed)
i have just left a second message for rtc, will try again

## 2016-01-25 NOTE — Telephone Encounter (Signed)
called

## 2016-01-26 ENCOUNTER — Encounter (HOSPITAL_COMMUNITY): Payer: Self-pay

## 2016-01-26 ENCOUNTER — Emergency Department (HOSPITAL_COMMUNITY): Payer: Medicare Other

## 2016-01-26 ENCOUNTER — Ambulatory Visit (INDEPENDENT_AMBULATORY_CARE_PROVIDER_SITE_OTHER)
Admission: EM | Admit: 2016-01-26 | Discharge: 2016-01-26 | Disposition: A | Discharge status: Nursing Home (Includes ICF) | Payer: Medicare Other | Source: Home / Self Care | Attending: Emergency Medicine | Admitting: Emergency Medicine

## 2016-01-26 ENCOUNTER — Inpatient Hospital Stay (HOSPITAL_COMMUNITY)
Admission: EM | Admit: 2016-01-26 | Discharge: 2016-02-03 | DRG: 853 | Disposition: A | Discharge status: Home Health | Payer: Medicare Other | Attending: Internal Medicine | Admitting: Internal Medicine

## 2016-01-26 ENCOUNTER — Encounter (HOSPITAL_COMMUNITY): Payer: Self-pay | Admitting: Emergency Medicine

## 2016-01-26 DIAGNOSIS — I5042 Chronic combined systolic (congestive) and diastolic (congestive) heart failure: Secondary | ICD-10-CM | POA: Diagnosis not present

## 2016-01-26 DIAGNOSIS — M301 Polyarteritis with lung involvement [Churg-Strauss]: Secondary | ICD-10-CM | POA: Diagnosis not present

## 2016-01-26 DIAGNOSIS — E1151 Type 2 diabetes mellitus with diabetic peripheral angiopathy without gangrene: Secondary | ICD-10-CM | POA: Diagnosis not present

## 2016-01-26 DIAGNOSIS — I743 Embolism and thrombosis of arteries of the lower extremities: Secondary | ICD-10-CM | POA: Diagnosis present

## 2016-01-26 DIAGNOSIS — N189 Chronic kidney disease, unspecified: Secondary | ICD-10-CM | POA: Diagnosis present

## 2016-01-26 DIAGNOSIS — Z888 Allergy status to other drugs, medicaments and biological substances status: Secondary | ICD-10-CM

## 2016-01-26 DIAGNOSIS — I482 Chronic atrial fibrillation, unspecified: Secondary | ICD-10-CM | POA: Diagnosis present

## 2016-01-26 DIAGNOSIS — A4101 Sepsis due to Methicillin susceptible Staphylococcus aureus: Secondary | ICD-10-CM | POA: Diagnosis present

## 2016-01-26 DIAGNOSIS — Z90710 Acquired absence of both cervix and uterus: Secondary | ICD-10-CM

## 2016-01-26 DIAGNOSIS — I130 Hypertensive heart and chronic kidney disease with heart failure and stage 1 through stage 4 chronic kidney disease, or unspecified chronic kidney disease: Secondary | ICD-10-CM | POA: Diagnosis not present

## 2016-01-26 DIAGNOSIS — J45909 Unspecified asthma, uncomplicated: Secondary | ICD-10-CM | POA: Diagnosis present

## 2016-01-26 DIAGNOSIS — I872 Venous insufficiency (chronic) (peripheral): Secondary | ICD-10-CM | POA: Diagnosis present

## 2016-01-26 DIAGNOSIS — Z9981 Dependence on supplemental oxygen: Secondary | ICD-10-CM

## 2016-01-26 DIAGNOSIS — A4901 Methicillin susceptible Staphylococcus aureus infection, unspecified site: Secondary | ICD-10-CM | POA: Diagnosis present

## 2016-01-26 DIAGNOSIS — M86171 Other acute osteomyelitis, right ankle and foot: Secondary | ICD-10-CM | POA: Diagnosis not present

## 2016-01-26 DIAGNOSIS — Z7901 Long term (current) use of anticoagulants: Secondary | ICD-10-CM | POA: Diagnosis not present

## 2016-01-26 DIAGNOSIS — Z7984 Long term (current) use of oral hypoglycemic drugs: Secondary | ICD-10-CM | POA: Diagnosis not present

## 2016-01-26 DIAGNOSIS — I739 Peripheral vascular disease, unspecified: Secondary | ICD-10-CM | POA: Diagnosis present

## 2016-01-26 DIAGNOSIS — H409 Unspecified glaucoma: Secondary | ICD-10-CM | POA: Diagnosis not present

## 2016-01-26 DIAGNOSIS — K219 Gastro-esophageal reflux disease without esophagitis: Secondary | ICD-10-CM | POA: Diagnosis present

## 2016-01-26 DIAGNOSIS — E872 Acidosis: Secondary | ICD-10-CM | POA: Diagnosis present

## 2016-01-26 DIAGNOSIS — Z79899 Other long term (current) drug therapy: Secondary | ICD-10-CM | POA: Diagnosis not present

## 2016-01-26 DIAGNOSIS — I4891 Unspecified atrial fibrillation: Secondary | ICD-10-CM | POA: Diagnosis not present

## 2016-01-26 DIAGNOSIS — M869 Osteomyelitis, unspecified: Secondary | ICD-10-CM | POA: Diagnosis present

## 2016-01-26 DIAGNOSIS — E11621 Type 2 diabetes mellitus with foot ulcer: Secondary | ICD-10-CM | POA: Diagnosis present

## 2016-01-26 DIAGNOSIS — I10 Essential (primary) hypertension: Secondary | ICD-10-CM | POA: Diagnosis present

## 2016-01-26 DIAGNOSIS — Z96641 Presence of right artificial hip joint: Secondary | ICD-10-CM | POA: Diagnosis present

## 2016-01-26 DIAGNOSIS — Z9841 Cataract extraction status, right eye: Secondary | ICD-10-CM

## 2016-01-26 DIAGNOSIS — E43 Unspecified severe protein-calorie malnutrition: Secondary | ICD-10-CM | POA: Diagnosis present

## 2016-01-26 DIAGNOSIS — L97519 Non-pressure chronic ulcer of other part of right foot with unspecified severity: Secondary | ICD-10-CM | POA: Diagnosis not present

## 2016-01-26 DIAGNOSIS — L89619 Pressure ulcer of right heel, unspecified stage: Secondary | ICD-10-CM | POA: Diagnosis not present

## 2016-01-26 DIAGNOSIS — E1122 Type 2 diabetes mellitus with diabetic chronic kidney disease: Secondary | ICD-10-CM | POA: Diagnosis not present

## 2016-01-26 DIAGNOSIS — I70234 Atherosclerosis of native arteries of right leg with ulceration of heel and midfoot: Secondary | ICD-10-CM | POA: Diagnosis not present

## 2016-01-26 DIAGNOSIS — E0500 Thyrotoxicosis with diffuse goiter without thyrotoxic crisis or storm: Secondary | ICD-10-CM | POA: Diagnosis not present

## 2016-01-26 DIAGNOSIS — I428 Other cardiomyopathies: Secondary | ICD-10-CM | POA: Diagnosis present

## 2016-01-26 DIAGNOSIS — E1169 Type 2 diabetes mellitus with other specified complication: Secondary | ICD-10-CM | POA: Diagnosis not present

## 2016-01-26 DIAGNOSIS — Z833 Family history of diabetes mellitus: Secondary | ICD-10-CM | POA: Diagnosis not present

## 2016-01-26 DIAGNOSIS — L089 Local infection of the skin and subcutaneous tissue, unspecified: Secondary | ICD-10-CM

## 2016-01-26 DIAGNOSIS — I700 Atherosclerosis of aorta: Secondary | ICD-10-CM | POA: Diagnosis present

## 2016-01-26 DIAGNOSIS — A419 Sepsis, unspecified organism: Secondary | ICD-10-CM | POA: Diagnosis present

## 2016-01-26 DIAGNOSIS — E7800 Pure hypercholesterolemia, unspecified: Secondary | ICD-10-CM | POA: Diagnosis present

## 2016-01-26 DIAGNOSIS — B9561 Methicillin susceptible Staphylococcus aureus infection as the cause of diseases classified elsewhere: Secondary | ICD-10-CM | POA: Diagnosis not present

## 2016-01-26 DIAGNOSIS — M7989 Other specified soft tissue disorders: Secondary | ICD-10-CM | POA: Diagnosis not present

## 2016-01-26 DIAGNOSIS — Z09 Encounter for follow-up examination after completed treatment for conditions other than malignant neoplasm: Secondary | ICD-10-CM

## 2016-01-26 DIAGNOSIS — D649 Anemia, unspecified: Secondary | ICD-10-CM | POA: Diagnosis present

## 2016-01-26 DIAGNOSIS — Z6820 Body mass index (BMI) 20.0-20.9, adult: Secondary | ICD-10-CM

## 2016-01-26 DIAGNOSIS — L8990 Pressure ulcer of unspecified site, unspecified stage: Secondary | ICD-10-CM | POA: Diagnosis present

## 2016-01-26 DIAGNOSIS — I272 Other secondary pulmonary hypertension: Secondary | ICD-10-CM | POA: Diagnosis present

## 2016-01-26 DIAGNOSIS — E11628 Type 2 diabetes mellitus with other skin complications: Secondary | ICD-10-CM | POA: Diagnosis present

## 2016-01-26 DIAGNOSIS — I5022 Chronic systolic (congestive) heart failure: Secondary | ICD-10-CM | POA: Diagnosis not present

## 2016-01-26 DIAGNOSIS — B999 Unspecified infectious disease: Secondary | ICD-10-CM

## 2016-01-26 LAB — CBC WITH DIFFERENTIAL/PLATELET
BASOS PCT: 0 %
Basophils Absolute: 0 10*3/uL (ref 0.0–0.1)
Eosinophils Absolute: 0.1 10*3/uL (ref 0.0–0.7)
Eosinophils Relative: 1 %
HEMATOCRIT: 42.3 % (ref 36.0–46.0)
Hemoglobin: 13.6 g/dL (ref 12.0–15.0)
Lymphocytes Relative: 13 %
Lymphs Abs: 0.9 10*3/uL (ref 0.7–4.0)
MCH: 30.3 pg (ref 26.0–34.0)
MCHC: 32.2 g/dL (ref 30.0–36.0)
MCV: 94.2 fL (ref 78.0–100.0)
MONO ABS: 0.8 10*3/uL (ref 0.1–1.0)
MONOS PCT: 12 %
NEUTROS ABS: 4.9 10*3/uL (ref 1.7–7.7)
Neutrophils Relative %: 74 %
Platelets: 363 10*3/uL (ref 150–400)
RBC: 4.49 MIL/uL (ref 3.87–5.11)
RDW: 14.8 % (ref 11.5–15.5)
WBC: 6.7 10*3/uL (ref 4.0–10.5)

## 2016-01-26 LAB — I-STAT CG4 LACTIC ACID, ED
LACTIC ACID, VENOUS: 2.46 mmol/L — AB (ref 0.5–2.0)
Lactic Acid, Venous: 3.06 mmol/L (ref 0.5–2.0)
Lactic Acid, Venous: 1.94 mmol/L (ref 0.5–2.0)

## 2016-01-26 LAB — POCT I-STAT, CHEM 8
BUN: 20 mg/dL (ref 6–20)
CALCIUM ION: 0.92 mmol/L — AB (ref 1.13–1.30)
CREATININE: 1.1 mg/dL — AB (ref 0.44–1.00)
Chloride: 95 mmol/L — ABNORMAL LOW (ref 101–111)
GLUCOSE: 174 mg/dL — AB (ref 65–99)
HCT: 43 % (ref 36.0–46.0)
Hemoglobin: 14.6 g/dL (ref 12.0–15.0)
Potassium: 4.1 mmol/L (ref 3.5–5.1)
SODIUM: 138 mmol/L (ref 135–145)
TCO2: 32 mmol/L (ref 0–100)

## 2016-01-26 LAB — COMPREHENSIVE METABOLIC PANEL
ALK PHOS: 122 U/L (ref 38–126)
ALT: 7 U/L — ABNORMAL LOW (ref 14–54)
ANION GAP: 10 (ref 5–15)
AST: 17 U/L (ref 15–41)
Albumin: 3.6 g/dL (ref 3.5–5.0)
BILIRUBIN TOTAL: 1.4 mg/dL — AB (ref 0.3–1.2)
BUN: 17 mg/dL (ref 6–20)
CALCIUM: 8.3 mg/dL — AB (ref 8.9–10.3)
CO2: 30 mmol/L (ref 22–32)
Chloride: 97 mmol/L — ABNORMAL LOW (ref 101–111)
Creatinine, Ser: 1.22 mg/dL — ABNORMAL HIGH (ref 0.44–1.00)
GFR calc non Af Amer: 38 mL/min — ABNORMAL LOW (ref >60)
GFR, EST AFRICAN AMERICAN: 44 mL/min — AB (ref >60)
GLUCOSE: 160 mg/dL — AB (ref 65–99)
Potassium: 4.2 mmol/L (ref 3.5–5.1)
Sodium: 137 mmol/L (ref 135–145)
TOTAL PROTEIN: 8.1 g/dL (ref 6.5–8.1)

## 2016-01-26 LAB — I-STAT BETA HCG BLOOD, ED (MC, WL, AP ONLY): I-stat hCG, quantitative: <5 m[IU]/mL (ref <5)

## 2016-01-26 MED ORDER — PIPERACILLIN-TAZOBACTAM 3.375 G IVPB
3.3750 g | Freq: Three times a day (TID) | INTRAVENOUS | Status: DC
  Administered 2016-01-26 – 2016-01-27 (×2): 3.375 g via INTRAVENOUS
  Filled 2016-01-26 (×5): qty 50

## 2016-01-26 MED ORDER — VANCOMYCIN HCL IN DEXTROSE 750-5 MG/150ML-% IV SOLN
750.0000 mg | INTRAVENOUS | Status: DC
  Filled 2016-01-26: qty 150

## 2016-01-26 MED ORDER — SODIUM CHLORIDE 0.9 % IV SOLN
Freq: Once | INTRAVENOUS | Status: AC
  Administered 2016-01-26: 18:00:00 via INTRAVENOUS

## 2016-01-26 MED ORDER — PIPERACILLIN-TAZOBACTAM 3.375 G IVPB 30 MIN: 3.3750 g | Freq: Once | INTRAVENOUS | Status: DC

## 2016-01-26 MED ORDER — MORPHINE SULFATE (PF) 4 MG/ML IV SOLN
3.0000 mg | Freq: Once | INTRAVENOUS | Status: AC
  Administered 2016-01-27: 3 mg via INTRAVENOUS
  Filled 2016-01-26: qty 1

## 2016-01-26 MED ORDER — VANCOMYCIN HCL IN DEXTROSE 1-5 GM/200ML-% IV SOLN
1000.0000 mg | Freq: Once | INTRAVENOUS | Status: AC
  Administered 2016-01-26: 1000 mg via INTRAVENOUS
  Filled 2016-01-26: qty 200

## 2016-01-26 MED ORDER — FENTANYL CITRATE (PF) 100 MCG/2ML IJ SOLN
50.0000 ug | Freq: Once | INTRAMUSCULAR | Status: AC
  Administered 2016-01-26: 50 ug via INTRAVENOUS
  Filled 2016-01-26: qty 2

## 2016-01-26 NOTE — ED Notes (Signed)
Pt is here for mult wounds to right foot onset x1 week... Very poor hx from pt and son who brought pt in... Son is trying to call his sister for better hx... Pt has hx of DM... States they were able to see PCP sometime this week but not sure if medications was given... Brought back in wheel chair... Alert... No acute distress.

## 2016-01-26 NOTE — ED Notes (Signed)
Pt placed on 2L [place]

## 2016-01-26 NOTE — Progress Notes (Signed)
Pharmacy Antibiotic Note  [name] is a 82 y.o. female admitted on 01/26/2016 with cellulitis.  Pharmacy has been consulted for Vancomycin and Zosyn dosing.  Plan: Zosyn 3.375g IV q8h (4 hour infusion).  Vancomycin 1 gram iv X 1 now then 750 mg iv Q 24 hours   Temp (24hrs), Avg:98.7 F (37.1 C), Min:98.5 F (36.9 C), Max:98.8 F (37.1 C)   Recent Labs Lab 01/22/16 1615 01/26/16 1458 01/26/16 1609 01/26/16 1625  WBC 4.0  --  6.7  --   CREATININE 1.17* 1.10*  --   --   LATICACIDVEN  --   --   --  2.46*    Estimated Creatinine Clearance: 34.4 mL/min (by C-G formula based on Cr of 1.1).    Allergies  Allergen Reactions  . Amiodarone Other (See Comments)    Contributed to hypoxia    Thank you for allowing pharmacy to be a part of this patient's care. Okey Regal, PharmD [phone] 01/26/2016 4:56 PM

## 2016-01-26 NOTE — ED Notes (Signed)
Pt here with pain to right foot and greater toe and has a diabetic ulcer as well as one to lateral aspect of foot. She was sent here from urgent care.

## 2016-01-26 NOTE — ED Provider Notes (Signed)
CSN: [id number]     Arrival date & time 01/26/16  1543 History   First MD Initiated Contact with Patient 01/26/16 1632     Chief Complaint  Patient presents with  . Foot Pain    HPI   82 year old female presents today with complaints of foot infection. Majority of patient's history was obtained from her daughter who is at bedside. She reports that the patient lives with her at their home, has been in normal health until approximately one week ago. She noticed that the patient was having right great toe pain, and redness surrounding the MTP. She notes this was around the site of her previous toe injury approximately one year ago where she fractured the toe. She notes the redness continues to spread, noticed that her posterior heel was becoming red with tenderness to palpation. She reports the right great toe became infected, warmth to the touch of the foot and distal lower extremity. She reports the patient has been acting appropriately, patient notes that she has not been feeling well over the last several days. They deny any fevers at home, any nausea or vomiting. No history of significant skin infections in the past. Patient is a diabetic. Eating and drinking appropriately. Family notes that patient has periods of time where she is able to ambulate, periods of time where she is not, this is baseline for her.   Past Medical History  Diagnosis Date  . Hypertension   . Asthma   . Osteoarthritis     left shoulder  . Pseudogout   . Hypokalemia     a. Previously felt due to diuretics, req supplementation.  . Churg-Strauss syndrome (HCC)     a. Sural nerve biopsy 02/2000.  Darene Lamer disease     a. s/p thyroidectomy.  . Glaucoma   . Hypercholesteremia   . Venous insufficiency   . Osteopenia     DEXA 2004  . Lipoma     left inner thigh  . Cataract     left eye  . VASCULITIS   . Chronic combined systolic and diastolic heart failure (HCC)     a. 02/2012 EF:20-25%  . Pleural effusion, left      a. Thoracentesis 2001 - per notes, no malignant cells, was transudative.  Marland Kitchen NSVT (nonsustained ventricular tachycardia) (HCC)     a. During CHF adm 2001.  . Multifocal atrial tachycardia (HCC)     a. Documented as OP 01/2012.  Marland Kitchen Ectopic atrial tachycardia (HCC)     a. Documented on tele as IP 02/2012.  . Pulmonary HTN (HCC)     a. Mod by echo 02/2012.  . Valvular heart disease     a. Echo 02/2012: mod MR/TR, mild AI.  Marland Kitchen Pericardial effusion     a. Echo 02/2012: small-mod pericardial effusion.  . Atrial fibrillation, permanent (HCC)     a. Dx 04/2013, on metoprolol, dig and xarelto  . Shortness of breath   . CHF (congestive heart failure) (HCC)   . On home oxygen therapy     "wear it mostly at night" (09/20/2014)  . Pneumonia 1930's  . Type II diabetes mellitus [facility])    Past Surgical History  Procedure Laterality Date  . Abdominal hysterectomy    . Salpingoophorectomy    . Thyroidectomy    . Hemiarthroplasty hip Right 10/2010    cemented for hip fracture, femoral neck - by Eulas Post, MD  . Cataract extraction Right   . Fracture surgery    .  Sural nerve biopsy  02/2000   Family History  Problem Relation Age of Onset  . Diabetes Mother   . Diabetes Father   . Heart disease Neg Hx    Social History  Substance Use Topics  . Smoking status: Never Smoker   . Smokeless tobacco: Never Used  . Alcohol Use: No   OB History    No data available     Review of Systems  All other systems reviewed and are negative.   Allergies  Amiodarone  Home Medications   Prior to Admission medications   Medication Sig Start Date End Date Taking? Authorizing Provider  acetaminophen-codeine (TYLENOL #3) 300-30 MG tablet Take 1 tablet by mouth every 8 (eight) hours as needed (for pain). 01/25/16  Yes Yolanda Manges, DO  albuterol (PROVENTIL HFA;VENTOLIN HFA) 108 (90 Base) MCG/ACT inhaler Inhale 2 puffs into the lungs every 6 (six) hours as needed for wheezing or shortness of breath.  09/18/15  Yes Yolanda Manges, DO  BD ULTRA-FINE LANCETS lancets Use to check blood sugar one time daily 08/23/15  Yes Jana Half, MD  brimonidine (ALPHAGAN P) 0.1 % SOLN Place 1 drop into the left eye every 12 (twelve) hours. 07/11/15  Yes Darrick Huntsman, MD  dorzolamide (TRUSOPT) 2 % ophthalmic solution Place 1 drop into the left eye 2 (two) times daily. 07/11/15  Yes Darrick Huntsman, MD  DULERA 100-5 MCG/ACT AERO INHALE 2 PUFFS INTO THE LUNGS TWICE DAILY 09/07/15  Yes Yolanda Manges, DO  ferrous sulfate 325 (65 FE) MG tablet TAKE 1 TABLET(325 MG) BY MOUTH DAILY 07/11/15  Yes Darrick Huntsman, MD  furosemide (LASIX) 80 MG tablet Take  in the AM and  in the PM 01/22/16  Yes Laurey Morale, MD  glucose blood test strip Use as instructed 07/11/15  Yes Darrick Huntsman, MD  lisinopril (PRINIVIL,ZESTRIL) 20 MG tablet Take 1 tablet (20 mg total) by mouth daily. 09/14/15  Yes Alex Elson Clan, DO  metFORMIN (GLUCOPHAGE) 1000 MG tablet TAKE 1 TABLET(1000 MG) BY MOUTH TWICE DAILY WITH A MEAL 09/20/15  Yes Yolanda Manges, DO  metoprolol succinate (TOPROL-XL) 100 MG 24 hr tablet Take 1 tablet twice daily. 01/22/16  Yes Laurey Morale, MD  potassium chloride SA (K-DUR,KLOR-CON) 20 MEQ tablet Take 2 tablets (40 mEq total) by mouth daily. Only take this medication on days you take Lasix. 11/17/14  Yes Alex Elson Clan, DO  ranitidine (ZANTAC) 75 MG tablet Take 75 mg by mouth daily as needed for heartburn.   Yes Historical Provider, MD  rivaroxaban (XARELTO) 20 MG TABS tablet Take 20 mg by mouth daily with supper.   Yes Historical Provider, MD  spironolactone (ALDACTONE) 25 MG tablet TAKE 1 TABLET BY MOUTH EVERY DAY 05/31/15  Yes Yolanda Manges, DO   BP 130/95 mmHg  Pulse 100  Temp(Src) 100 F (37.8 C) (Rectal)  Resp 19  SpO2 98%   Physical Exam  Constitutional: She is oriented to person, place, and time. She appears well-developed and well-nourished.  HENT:  Head: Normocephalic and atraumatic.  Eyes:  Conjunctivae are normal. Pupils are equal, round, and reactive to light. Right eye exhibits no discharge. Left eye exhibits no discharge. No scleral icterus.  Neck: Normal range of motion. No JVD present. No tracheal deviation present.  Pulmonary/Chest: Effort normal. No stridor.  Musculoskeletal:  Infection to the right great toe, devitalized tissue to the distal aspect. Ulceration to the lateral aspect of the right  MTP. Redness, warmth to touch, ulcer noted to the posterior heel  Neurological: She is alert and oriented to person, place, and time. Coordination normal.  Psychiatric: She has a normal mood and affect. Her behavior is normal. Judgment and thought content normal.  Nursing note and vitals reviewed.   ED Course  Procedures (including critical care time) Labs Review Labs Reviewed  COMPREHENSIVE METABOLIC PANEL - Abnormal; Notable for the following:    Chloride 97 (*)    Glucose, Bld 160 (*)    Creatinine, Ser 1.22 (*)    Calcium 8.3 (*)    ALT 7 (*)    Total Bilirubin 1.4 (*)    GFR calc non Af Amer 38 (*)    GFR calc Af Amer 44 (*)    All other components within normal limits  URINALYSIS, ROUTINE W REFLEX MICROSCOPIC (NOT AT [facility]) - Abnormal; Notable for the following:    Hgb urine dipstick TRACE (*)    Protein, ur 30 (*)    All other components within normal limits  URINE MICROSCOPIC-ADD ON - Abnormal; Notable for the following:    Squamous Epithelial / LPF 6-30 (*)    Bacteria, UA FEW (*)    All other components within normal limits  I-STAT CG4 LACTIC ACID, ED - Abnormal; Notable for the following:    Lactic Acid, Venous 2.46 (*)    All other components within normal limits  I-STAT CG4 LACTIC ACID, ED - Abnormal; Notable for the following:    Lactic Acid, Venous 3.06 (*)    All other components within normal limits  CULTURE, BLOOD (ROUTINE X 2)  CULTURE, BLOOD (ROUTINE X 2)  URINE CULTURE  CBC WITH DIFFERENTIAL/PLATELET  I-STAT BETA HCG BLOOD, ED ([facility], [facility], [facility]  ONLY)  I-STAT CG4 LACTIC ACID, ED  I-STAT CG4 LACTIC ACID, ED  I-STAT CG4 LACTIC ACID, ED    Imaging Review Dg Chest 2 View  01/26/2016  CLINICAL DATA:  Sepsis. EXAM: CHEST  2 VIEW COMPARISON:  Chest radiographs dated 11/24/2014 and chest CT dated 05/03/2015. FINDINGS: Stable enlarged cardiac silhouette and left basilar scarring. The trachea remains deviated to the right at the thoracic inlet. Diffuse osteopenia. IMPRESSION: 1. No acute abnormality. 2. Stable cardiomegaly and left basilar scarring. 3. Stable tracheal deviation to the right at the level of the thoracic inlet due to a previously demonstrated large thyroid nodule. Electronically Signed   By: Beckie Salts M.D.   On: 01/26/2016 19:39   Dg Foot Complete Right  01/26/2016  CLINICAL DATA:  Right foot wounds, diabetes EXAM: RIGHT FOOT COMPLETE - 3+ VIEW COMPARISON:  12/09/2013 FINDINGS: Three views of the right foot submitted. There is diffuse osteopenia. Mild soft tissue swelling adjacent to distal aspect of first metatarsal. No acute fracture or subluxation. Atherosclerotic vascular calcifications are noted. No bone destruction to suggest osteomyelitis. There is plantar spur of calcaneus. Dorsal spurring anterior aspect of the talus. IMPRESSION: No acute fracture or subluxation. Limited study by diffuse osteopenia. Mild soft tissue swelling adjacent to distal aspect of first metatarsal. No evidence of bone destruction to suggest osteomyelitis. Plantar spur of calcaneus. Degenerative changes with dorsal spurring anterior aspect of the talus. Electronically Signed   By: Natasha Mead M.D.   On: 01/26/2016 17:00   I have personally reviewed and evaluated these images and lab results as part of my medical decision-making.   EKG Interpretation None      MDM   Final diagnoses:  Infection  Acute osteomyelitis of right foot (  HCC)    Labs:Lactic acid, blood cultures, CMP Thomas CBC  Imaging: MR foot left DG chest 2 view  Consults:  Hospital service  Therapeutics: vancomycin, zosyn  Discharge Meds:   Assessment/Plan: 82 year old female presents today with likely osteomyelitis of her foot. MRI results pending at this time, per discussion with radiologist with likely posterior. Patient additionally had elevated lactic acid, she was started on fluids, antibiotics, lactic acid cleared while she was here. Patient remained stable while here in the ED, she'll be admitted to the hospital with likely orthopedic consultation.        Eyvonne Mechanic, PA-C 01/27/16 2130  Loren Racer, MD 01/27/16 1340

## 2016-01-26 NOTE — ED Provider Notes (Signed)
CSN: [id number]     Arrival date & time 01/26/16  1332 History   First MD Initiated Contact with Patient 01/26/16 1428     Chief Complaint  Patient presents with  . Skin Ulcer   (Consider location/radiation/quality/duration/timing/severity/associated sxs/prior Treatment) HPI  She is an 82 year old woman here for right foot ulcers. She is here with her son. I did speak to the daughter via phone, who is her primary caregiver.  The daughter states that about 5 months ago there was an injury to that foot, but she did not notice any wounds at that time. She has complained of pain in the foot over the last week or so. The daughter noticed wounds of the first MTP joint and great toe.  In the last several days, the patient had started complaining of pain in the posterior right heel. Initially, the daughter did not see anything, but has been propping the foot on pillows and blankets. Yesterday, she noticed some blood on a pillow. They've been unable to get an appointment with her PCP.  She did see her cardiologist on June 6 and her Lasix dose was temporarily adjusted due to increased fluid. Today, the daughter states she is not taking Lasix as her weight is below 150.  She does have pain in that right foot. Some of the wounds are draining a little bit.  She was on amoxicillin the end of April.  Patient has a history of diabetes, heart failure, A. fib and peripheral vascular disease.  Past Medical History  Diagnosis Date  . Hypertension   . Asthma   . Osteoarthritis     left shoulder  . Pseudogout   . Hypokalemia     a. Previously felt due to diuretics, req supplementation.  . Churg-Strauss syndrome (HCC)     a. Sural nerve biopsy 02/2000.  Darene Lamer disease     a. s/p thyroidectomy.  . Glaucoma   . Hypercholesteremia   . Venous insufficiency   . Osteopenia     DEXA 2004  . Lipoma     left inner thigh  . Cataract     left eye  . VASCULITIS   . Chronic combined systolic and diastolic heart  failure (HCC)     a. 02/2012 EF:20-25%  . Pleural effusion, left     a. Thoracentesis 2001 - per notes, no malignant cells, was transudative.  Marland Kitchen NSVT (nonsustained ventricular tachycardia) (HCC)     a. During CHF adm 2001.  . Multifocal atrial tachycardia (HCC)     a. Documented as OP 01/2012.  Marland Kitchen Ectopic atrial tachycardia (HCC)     a. Documented on tele as IP 02/2012.  . Pulmonary HTN (HCC)     a. Mod by echo 02/2012.  . Valvular heart disease     a. Echo 02/2012: mod MR/TR, mild AI.  Marland Kitchen Pericardial effusion     a. Echo 02/2012: small-mod pericardial effusion.  . Atrial fibrillation, permanent (HCC)     a. Dx 04/2013, on metoprolol, dig and xarelto  . Shortness of breath   . CHF (congestive heart failure) (HCC)   . On home oxygen therapy     "wear it mostly at night" (09/20/2014)  . Pneumonia 1930's  . Type II diabetes mellitus [facility])    Past Surgical History  Procedure Laterality Date  . Abdominal hysterectomy    . Salpingoophorectomy    . Thyroidectomy    . Hemiarthroplasty hip Right 10/2010    cemented for hip fracture, femoral neck -  by Eulas Post, MD  . Cataract extraction Right   . Fracture surgery    . Sural nerve biopsy  02/2000   Family History  Problem Relation Age of Onset  . Diabetes Mother   . Diabetes Father   . Heart disease Neg Hx    Social History  Substance Use Topics  . Smoking status: Never Smoker   . Smokeless tobacco: Never Used  . Alcohol Use: No   OB History    No data available     Review of Systems As in history of present illness Allergies  Amiodarone  Home Medications   Prior to Admission medications   Medication Sig Start Date End Date Taking? Authorizing Provider  acetaminophen-codeine (TYLENOL #3) 300-30 MG tablet Take 1 tablet by mouth every 8 (eight) hours as needed (for pain). 01/25/16   Yolanda Manges, DO  albuterol (PROVENTIL HFA;VENTOLIN HFA) 108 (90 Base) MCG/ACT inhaler Inhale 2 puffs into the lungs every 6 (six) hours as  needed for wheezing or shortness of breath. 09/18/15   Yolanda Manges, DO  BD ULTRA-FINE LANCETS lancets Use to check blood sugar one time daily 08/23/15   Jana Half, MD  brimonidine (ALPHAGAN P) 0.1 % SOLN Place 1 drop into the left eye every 12 (twelve) hours. 07/11/15   Darrick Huntsman, MD  dorzolamide (TRUSOPT) 2 % ophthalmic solution Place 1 drop into the left eye 2 (two) times daily. 07/11/15   Darrick Huntsman, MD  DULERA 100-5 MCG/ACT AERO INHALE 2 PUFFS INTO THE LUNGS TWICE DAILY 09/07/15   Yolanda Manges, DO  ferrous sulfate 325 (65 FE) MG tablet TAKE 1 TABLET(325 MG) BY MOUTH DAILY 07/11/15   Darrick Huntsman, MD  furosemide (LASIX) 80 MG tablet Take 80mg  in the AM and 40mg  in the PM 01/22/16   Laurey Morale, MD  glucose blood test strip Use as instructed 07/11/15   Darrick Huntsman, MD  lisinopril (PRINIVIL,ZESTRIL) 20 MG tablet Take 1 tablet (20 mg total) by mouth daily. 09/14/15   Yolanda Manges, DO  metFORMIN (GLUCOPHAGE) 1000 MG tablet TAKE 1 TABLET(1000 MG) BY MOUTH TWICE DAILY WITH A MEAL 09/20/15   Yolanda Manges, DO  metoprolol succinate (TOPROL-XL) 100 MG 24 hr tablet Take 1 tablet twice daily. 01/22/16   Laurey Morale, MD  potassium chloride SA (K-DUR,KLOR-CON) 20 MEQ tablet Take 2 tablets (40 mEq total) by mouth daily. Only take this medication on days you take Lasix. 11/17/14   Yolanda Manges, DO  ranitidine (ZANTAC) 75 MG tablet Take 75 mg by mouth daily as needed for heartburn.    Historical Provider, MD  rivaroxaban (XARELTO) 20 MG TABS tablet Take 20 mg by mouth daily with supper.    Historical Provider, MD  spironolactone (ALDACTONE) 25 MG tablet TAKE 1 TABLET BY MOUTH EVERY DAY 05/31/15   Yolanda Manges, DO   Meds Ordered and Administered this Visit  Medications - No data to display  BP 128/97 mmHg  Pulse 120  Temp(Src) 98.5 F (36.9 C) (Oral)  Resp 16  SpO2 96% No data found.   Physical Exam  Constitutional: She appears well-developed and well-nourished. No  distress.  Neck: Neck supple.  Cardiovascular:  Tachycardic  Pulmonary/Chest: Effort normal.  Neurological: She is alert.  Skin:  Right foot: I'm unable to palpate a pulse. Cap refill difficult to assess due to skin tone, but appears normal. The great toenail is obscured by pus and debris.  There appears to be an open area at the distal toenail. She also has a 2.5 cm ulcer over the lateral first MTP joint. This is tender with a black eschar.  She also has some erythema of the posterior heel. There is no open wound here, but skin palpates as boggy.    ED Course  Procedures (including critical care time)  Labs Review Labs Reviewed  POCT I-STAT, CHEM 8 - Abnormal; Notable for the following:    Chloride 95 (*)    Creatinine, Ser 1.10 (*)    Glucose, Bld 174 (*)    Calcium, Ion 0.92 (*)    All other components within normal limits    Imaging Review No results found.   MDM   1. Type 2 diabetes mellitus with right diabetic foot ulcer (HCC)   2. Pressure ulcer of right heel, unspecified pressure ulcer stage    She has 3 separate ulcers/wounds on the right foot. The heel wound is likely a pressure ulcer, but I am unable to stage this. Given her persistent tachycardia despite previous good control on her current medications and her elevated creatinine compared to prior values, I am concerned for deeper infection. She is also at increased risk for poor wound healing and osteomyelitis given her medical problems. These wounds will need to be explored and debridement. I'm unable to provide this service at the urgent care. We'll transfer to [facility] ED via private vehicle for additional management.    Charm Rings, MD 01/26/16 [phone]

## 2016-01-26 NOTE — ED Notes (Signed)
Declined shuttle service... tx by PV

## 2016-01-27 DIAGNOSIS — N189 Chronic kidney disease, unspecified: Secondary | ICD-10-CM | POA: Diagnosis present

## 2016-01-27 DIAGNOSIS — Z90710 Acquired absence of both cervix and uterus: Secondary | ICD-10-CM | POA: Diagnosis not present

## 2016-01-27 DIAGNOSIS — Z888 Allergy status to other drugs, medicaments and biological substances status: Secondary | ICD-10-CM | POA: Diagnosis not present

## 2016-01-27 DIAGNOSIS — M869 Osteomyelitis, unspecified: Secondary | ICD-10-CM | POA: Diagnosis present

## 2016-01-27 DIAGNOSIS — H409 Unspecified glaucoma: Secondary | ICD-10-CM | POA: Diagnosis present

## 2016-01-27 DIAGNOSIS — I130 Hypertensive heart and chronic kidney disease with heart failure and stage 1 through stage 4 chronic kidney disease, or unspecified chronic kidney disease: Secondary | ICD-10-CM | POA: Diagnosis present

## 2016-01-27 DIAGNOSIS — B9561 Methicillin susceptible Staphylococcus aureus infection as the cause of diseases classified elsewhere: Secondary | ICD-10-CM | POA: Diagnosis not present

## 2016-01-27 DIAGNOSIS — R600 Localized edema: Secondary | ICD-10-CM | POA: Diagnosis not present

## 2016-01-27 DIAGNOSIS — Z96641 Presence of right artificial hip joint: Secondary | ICD-10-CM | POA: Diagnosis present

## 2016-01-27 DIAGNOSIS — E1151 Type 2 diabetes mellitus with diabetic peripheral angiopathy without gangrene: Secondary | ICD-10-CM | POA: Diagnosis present

## 2016-01-27 DIAGNOSIS — Z7984 Long term (current) use of oral hypoglycemic drugs: Secondary | ICD-10-CM | POA: Diagnosis not present

## 2016-01-27 DIAGNOSIS — L0889 Other specified local infections of the skin and subcutaneous tissue: Secondary | ICD-10-CM | POA: Diagnosis not present

## 2016-01-27 DIAGNOSIS — T814XXA Infection following a procedure, initial encounter: Secondary | ICD-10-CM | POA: Diagnosis not present

## 2016-01-27 DIAGNOSIS — Z6820 Body mass index (BMI) 20.0-20.9, adult: Secondary | ICD-10-CM | POA: Diagnosis not present

## 2016-01-27 DIAGNOSIS — Z09 Encounter for follow-up examination after completed treatment for conditions other than malignant neoplasm: Secondary | ICD-10-CM | POA: Diagnosis not present

## 2016-01-27 DIAGNOSIS — I739 Peripheral vascular disease, unspecified: Secondary | ICD-10-CM | POA: Diagnosis not present

## 2016-01-27 DIAGNOSIS — I872 Venous insufficiency (chronic) (peripheral): Secondary | ICD-10-CM | POA: Diagnosis present

## 2016-01-27 DIAGNOSIS — Z79899 Other long term (current) drug therapy: Secondary | ICD-10-CM | POA: Diagnosis not present

## 2016-01-27 DIAGNOSIS — Z833 Family history of diabetes mellitus: Secondary | ICD-10-CM | POA: Diagnosis not present

## 2016-01-27 DIAGNOSIS — L089 Local infection of the skin and subcutaneous tissue, unspecified: Secondary | ICD-10-CM

## 2016-01-27 DIAGNOSIS — E11621 Type 2 diabetes mellitus with foot ulcer: Secondary | ICD-10-CM | POA: Diagnosis present

## 2016-01-27 DIAGNOSIS — L89619 Pressure ulcer of right heel, unspecified stage: Secondary | ICD-10-CM | POA: Diagnosis not present

## 2016-01-27 DIAGNOSIS — A4901 Methicillin susceptible Staphylococcus aureus infection, unspecified site: Secondary | ICD-10-CM | POA: Diagnosis not present

## 2016-01-27 DIAGNOSIS — E43 Unspecified severe protein-calorie malnutrition: Secondary | ICD-10-CM | POA: Diagnosis not present

## 2016-01-27 DIAGNOSIS — I5042 Chronic combined systolic (congestive) and diastolic (congestive) heart failure: Secondary | ICD-10-CM | POA: Diagnosis present

## 2016-01-27 DIAGNOSIS — I5022 Chronic systolic (congestive) heart failure: Secondary | ICD-10-CM

## 2016-01-27 DIAGNOSIS — Y838 Other surgical procedures as the cause of abnormal reaction of the patient, or of later complication, without mention of misadventure at the time of the procedure: Secondary | ICD-10-CM | POA: Diagnosis not present

## 2016-01-27 DIAGNOSIS — I743 Embolism and thrombosis of arteries of the lower extremities: Secondary | ICD-10-CM | POA: Diagnosis not present

## 2016-01-27 DIAGNOSIS — I428 Other cardiomyopathies: Secondary | ICD-10-CM | POA: Diagnosis present

## 2016-01-27 DIAGNOSIS — J45909 Unspecified asthma, uncomplicated: Secondary | ICD-10-CM | POA: Diagnosis present

## 2016-01-27 DIAGNOSIS — I700 Atherosclerosis of aorta: Secondary | ICD-10-CM | POA: Diagnosis present

## 2016-01-27 DIAGNOSIS — A4101 Sepsis due to Methicillin susceptible Staphylococcus aureus: Secondary | ICD-10-CM | POA: Diagnosis present

## 2016-01-27 DIAGNOSIS — Z9981 Dependence on supplemental oxygen: Secondary | ICD-10-CM | POA: Diagnosis not present

## 2016-01-27 DIAGNOSIS — E872 Acidosis: Secondary | ICD-10-CM | POA: Diagnosis present

## 2016-01-27 DIAGNOSIS — R7881 Bacteremia: Secondary | ICD-10-CM | POA: Diagnosis not present

## 2016-01-27 DIAGNOSIS — Z9841 Cataract extraction status, right eye: Secondary | ICD-10-CM | POA: Diagnosis not present

## 2016-01-27 DIAGNOSIS — J90 Pleural effusion, not elsewhere classified: Secondary | ICD-10-CM | POA: Diagnosis not present

## 2016-01-27 DIAGNOSIS — E11628 Type 2 diabetes mellitus with other skin complications: Secondary | ICD-10-CM | POA: Diagnosis present

## 2016-01-27 DIAGNOSIS — I10 Essential (primary) hypertension: Secondary | ICD-10-CM

## 2016-01-27 DIAGNOSIS — I272 Other secondary pulmonary hypertension: Secondary | ICD-10-CM | POA: Diagnosis present

## 2016-01-27 DIAGNOSIS — A419 Sepsis, unspecified organism: Secondary | ICD-10-CM | POA: Diagnosis present

## 2016-01-27 DIAGNOSIS — M79609 Pain in unspecified limb: Secondary | ICD-10-CM | POA: Diagnosis not present

## 2016-01-27 DIAGNOSIS — M86171 Other acute osteomyelitis, right ankle and foot: Secondary | ICD-10-CM | POA: Diagnosis not present

## 2016-01-27 DIAGNOSIS — Z7901 Long term (current) use of anticoagulants: Secondary | ICD-10-CM | POA: Diagnosis not present

## 2016-01-27 DIAGNOSIS — I4891 Unspecified atrial fibrillation: Secondary | ICD-10-CM | POA: Diagnosis not present

## 2016-01-27 DIAGNOSIS — I70234 Atherosclerosis of native arteries of right leg with ulceration of heel and midfoot: Secondary | ICD-10-CM | POA: Diagnosis not present

## 2016-01-27 DIAGNOSIS — M301 Polyarteritis with lung involvement [Churg-Strauss]: Secondary | ICD-10-CM | POA: Diagnosis not present

## 2016-01-27 DIAGNOSIS — I482 Chronic atrial fibrillation: Secondary | ICD-10-CM | POA: Diagnosis not present

## 2016-01-27 DIAGNOSIS — D649 Anemia, unspecified: Secondary | ICD-10-CM | POA: Diagnosis present

## 2016-01-27 DIAGNOSIS — Z452 Encounter for adjustment and management of vascular access device: Secondary | ICD-10-CM | POA: Diagnosis not present

## 2016-01-27 DIAGNOSIS — E1169 Type 2 diabetes mellitus with other specified complication: Secondary | ICD-10-CM | POA: Diagnosis not present

## 2016-01-27 DIAGNOSIS — E7800 Pure hypercholesterolemia, unspecified: Secondary | ICD-10-CM | POA: Diagnosis present

## 2016-01-27 DIAGNOSIS — E0500 Thyrotoxicosis with diffuse goiter without thyrotoxic crisis or storm: Secondary | ICD-10-CM | POA: Diagnosis present

## 2016-01-27 DIAGNOSIS — E1122 Type 2 diabetes mellitus with diabetic chronic kidney disease: Secondary | ICD-10-CM | POA: Diagnosis present

## 2016-01-27 DIAGNOSIS — K219 Gastro-esophageal reflux disease without esophagitis: Secondary | ICD-10-CM | POA: Diagnosis present

## 2016-01-27 LAB — BLOOD CULTURE ID PANEL (REFLEXED)
Acinetobacter baumannii: NOT DETECTED
Candida albicans: NOT DETECTED
Candida glabrata: NOT DETECTED
Candida krusei: NOT DETECTED
Candida parapsilosis: NOT DETECTED
Candida tropicalis: NOT DETECTED
Carbapenem resistance: NOT DETECTED
Enterobacter cloacae complex: NOT DETECTED
Enterobacteriaceae species: NOT DETECTED
Enterococcus species: NOT DETECTED
Escherichia coli: NOT DETECTED
Haemophilus influenzae: NOT DETECTED
Klebsiella oxytoca: NOT DETECTED
Klebsiella pneumoniae: NOT DETECTED
Listeria monocytogenes: NOT DETECTED
Methicillin resistance: NOT DETECTED
Neisseria meningitidis: NOT DETECTED
Proteus species: NOT DETECTED
Pseudomonas aeruginosa: NOT DETECTED
Serratia marcescens: NOT DETECTED
Staphylococcus aureus (BCID): DETECTED — AB
Staphylococcus species: DETECTED — AB
Streptococcus agalactiae: NOT DETECTED
Streptococcus pneumoniae: NOT DETECTED
Streptococcus pyogenes: NOT DETECTED
Streptococcus species: NOT DETECTED
Vancomycin resistance: NOT DETECTED

## 2016-01-27 LAB — CBC
HEMATOCRIT: 40.5 % (ref 36.0–46.0)
HEMOGLOBIN: 12.5 g/dL (ref 12.0–15.0)
MCH: 29.8 pg (ref 26.0–34.0)
MCHC: 30.9 g/dL (ref 30.0–36.0)
MCV: 96.4 fL (ref 78.0–100.0)
Platelets: 303 10*3/uL (ref 150–400)
RBC: 4.2 MIL/uL (ref 3.87–5.11)
RDW: 15.1 % (ref 11.5–15.5)
WBC: 8.3 10*3/uL (ref 4.0–10.5)

## 2016-01-27 LAB — C-REACTIVE PROTEIN: CRP: 14.8 mg/dL — ABNORMAL HIGH (ref <1.0)

## 2016-01-27 LAB — SEDIMENTATION RATE: SED RATE: 56 mm/h — AB (ref 0–22)

## 2016-01-27 LAB — URINE MICROSCOPIC-ADD ON

## 2016-01-27 LAB — URINALYSIS, ROUTINE W REFLEX MICROSCOPIC
Bilirubin Urine: NEGATIVE
Glucose, UA: NEGATIVE mg/dL
Ketones, ur: NEGATIVE mg/dL
Leukocytes, UA: NEGATIVE
Nitrite: NEGATIVE
Protein, ur: 30 mg/dL — AB
Specific Gravity, Urine: 1.01 (ref 1.005–1.030)
pH: 5.5 (ref 5.0–8.0)

## 2016-01-27 LAB — BASIC METABOLIC PANEL
Anion gap: 11 (ref 5–15)
BUN: 16 mg/dL (ref 6–20)
CHLORIDE: 98 mmol/L — AB (ref 101–111)
CO2: 29 mmol/L (ref 22–32)
Calcium: 8 mg/dL — ABNORMAL LOW (ref 8.9–10.3)
Creatinine, Ser: 1.16 mg/dL — ABNORMAL HIGH (ref 0.44–1.00)
GFR calc Af Amer: 47 mL/min — ABNORMAL LOW (ref >60)
GFR calc non Af Amer: 41 mL/min — ABNORMAL LOW (ref >60)
GLUCOSE: 124 mg/dL — AB (ref 65–99)
POTASSIUM: 3.7 mmol/L (ref 3.5–5.1)
Sodium: 138 mmol/L (ref 135–145)

## 2016-01-27 LAB — GLUCOSE, CAPILLARY
GLUCOSE-CAPILLARY: 126 mg/dL — AB (ref 65–99)
GLUCOSE-CAPILLARY: 40 mg/dL — AB (ref 65–99)
Glucose-Capillary: 104 mg/dL — ABNORMAL HIGH (ref 65–99)
Glucose-Capillary: 217 mg/dL — ABNORMAL HIGH (ref 65–99)
Glucose-Capillary: 141 mg/dL — ABNORMAL HIGH (ref 65–99)
Glucose-Capillary: 20 mg/dL — CL (ref 65–99)

## 2016-01-27 LAB — PREALBUMIN: Prealbumin: 7.4 mg/dL — ABNORMAL LOW (ref 18–38)

## 2016-01-27 MED ORDER — DEXTROSE 50 % IV SOLN
INTRAVENOUS | Status: AC
  Administered 2016-01-27: 50 mL
  Filled 2016-01-27: qty 50

## 2016-01-27 MED ORDER — POLYETHYLENE GLYCOL 3350 17 G PO PACK: 17.0000 g | PACK | Freq: Every day | ORAL | Status: DC | PRN

## 2016-01-27 MED ORDER — SPIRONOLACTONE 25 MG PO TABS
25.0000 mg | ORAL_TABLET | Freq: Every day | ORAL | Status: DC
  Administered 2016-01-27 – 2016-02-03 (×7): 25 mg via ORAL
  Filled 2016-01-27 (×7): qty 1

## 2016-01-27 MED ORDER — FUROSEMIDE 80 MG PO TABS
80.0000 mg | ORAL_TABLET | Freq: Every day | ORAL | Status: DC
  Administered 2016-01-27 – 2016-02-03 (×7): 80 mg via ORAL
  Filled 2016-01-27 (×7): qty 1

## 2016-01-27 MED ORDER — FERROUS SULFATE 325 (65 FE) MG PO TABS
325.0000 mg | ORAL_TABLET | Freq: Every day | ORAL | Status: DC
  Administered 2016-01-27 – 2016-02-03 (×8): 325 mg via ORAL
  Filled 2016-01-27 (×8): qty 1

## 2016-01-27 MED ORDER — FUROSEMIDE 40 MG PO TABS
40.0000 mg | ORAL_TABLET | Freq: Every evening | ORAL | Status: DC
  Administered 2016-01-27 – 2016-02-03 (×8): 40 mg via ORAL
  Filled 2016-01-27 (×8): qty 1

## 2016-01-27 MED ORDER — CEFAZOLIN SODIUM 1-5 GM-% IV SOLN
1.0000 g | Freq: Three times a day (TID) | INTRAVENOUS | Status: DC
  Administered 2016-01-27 – 2016-02-03 (×23): 1 g via INTRAVENOUS
  Filled 2016-01-27 (×24): qty 50

## 2016-01-27 MED ORDER — METOPROLOL SUCCINATE ER 100 MG PO TB24
100.0000 mg | ORAL_TABLET | Freq: Two times a day (BID) | ORAL | Status: DC
  Administered 2016-01-27 (×2): 100 mg via ORAL
  Filled 2016-01-27 (×3): qty 1

## 2016-01-27 MED ORDER — ALBUTEROL SULFATE (2.5 MG/3ML) 0.083% IN NEBU: 2.5000 mg | INHALATION_SOLUTION | Freq: Four times a day (QID) | RESPIRATORY_TRACT | Status: DC | PRN

## 2016-01-27 MED ORDER — POTASSIUM CHLORIDE CRYS ER 20 MEQ PO TBCR
40.0000 meq | EXTENDED_RELEASE_TABLET | Freq: Every day | ORAL | Status: DC
  Administered 2016-01-27 – 2016-02-01 (×5): 40 meq via ORAL
  Filled 2016-01-27 (×5): qty 2

## 2016-01-27 MED ORDER — MOMETASONE FURO-FORMOTEROL FUM 100-5 MCG/ACT IN AERO
2.0000 | INHALATION_SPRAY | Freq: Two times a day (BID) | RESPIRATORY_TRACT | Status: DC
  Administered 2016-01-27 – 2016-02-03 (×13): 2 via RESPIRATORY_TRACT
  Filled 2016-01-27: qty 8.8

## 2016-01-27 MED ORDER — INSULIN ASPART 100 UNIT/ML ~~LOC~~ SOLN
0.0000 [IU] | Freq: Three times a day (TID) | SUBCUTANEOUS | Status: DC
  Administered 2016-01-27: 5 [IU] via SUBCUTANEOUS
  Administered 2016-01-28: 2 [IU] via SUBCUTANEOUS
  Administered 2016-01-28: 3 [IU] via SUBCUTANEOUS
  Administered 2016-01-28: 2 [IU] via SUBCUTANEOUS
  Administered 2016-01-29: 3 [IU] via SUBCUTANEOUS
  Administered 2016-01-29: 8 [IU] via SUBCUTANEOUS
  Administered 2016-01-30: 3 [IU] via SUBCUTANEOUS
  Administered 2016-01-31 (×2): 2 [IU] via SUBCUTANEOUS
  Administered 2016-01-31: 3 [IU] via SUBCUTANEOUS
  Administered 2016-02-01 (×3): 2 [IU] via SUBCUTANEOUS

## 2016-01-27 MED ORDER — HYDROMORPHONE HCL 1 MG/ML IJ SOLN
0.5000 mg | INTRAMUSCULAR | Status: DC | PRN
  Administered 2016-01-28 – 2016-02-03 (×5): 0.5 mg via INTRAVENOUS
  Filled 2016-01-27 (×6): qty 1

## 2016-01-27 MED ORDER — RIVAROXABAN 20 MG PO TABS
20.0000 mg | ORAL_TABLET | Freq: Every day | ORAL | Status: DC
  Filled 2016-01-27: qty 1

## 2016-01-27 MED ORDER — LISINOPRIL 20 MG PO TABS
20.0000 mg | ORAL_TABLET | Freq: Every day | ORAL | Status: DC
  Administered 2016-01-27: 20 mg via ORAL
  Filled 2016-01-27 (×2): qty 1

## 2016-01-27 MED ORDER — ONDANSETRON HCL 4 MG/2ML IJ SOLN: 4.0000 mg | Freq: Four times a day (QID) | INTRAMUSCULAR | Status: DC | PRN

## 2016-01-27 MED ORDER — ACETAMINOPHEN 325 MG PO TABS
650.0000 mg | ORAL_TABLET | Freq: Four times a day (QID) | ORAL | Status: DC | PRN
  Administered 2016-01-28 – 2016-01-29 (×2): 650 mg via ORAL
  Filled 2016-01-27 (×2): qty 2

## 2016-01-27 MED ORDER — RIVAROXABAN 15 MG PO TABS
15.0000 mg | ORAL_TABLET | Freq: Every day | ORAL | Status: DC
  Administered 2016-01-27: 15 mg via ORAL
  Filled 2016-01-27: qty 1

## 2016-01-27 MED ORDER — BRIMONIDINE TARTRATE 0.2 % OP SOLN
1.0000 [drp] | Freq: Two times a day (BID) | OPHTHALMIC | Status: DC
  Administered 2016-01-27 – 2016-02-03 (×14): 1 [drp] via OPHTHALMIC
  Filled 2016-01-27 (×2): qty 5

## 2016-01-27 MED ORDER — ONDANSETRON HCL 4 MG PO TABS: 4.0000 mg | ORAL_TABLET | Freq: Four times a day (QID) | ORAL | Status: DC | PRN

## 2016-01-27 MED ORDER — DORZOLAMIDE HCL 2 % OP SOLN
1.0000 [drp] | Freq: Two times a day (BID) | OPHTHALMIC | Status: DC
  Administered 2016-01-27 – 2016-02-03 (×14): 1 [drp] via OPHTHALMIC
  Filled 2016-01-27 (×2): qty 10

## 2016-01-27 MED ORDER — ACETAMINOPHEN 650 MG RE SUPP: 650.0000 mg | Freq: Four times a day (QID) | RECTAL | Status: DC | PRN

## 2016-01-27 MED ORDER — FAMOTIDINE 20 MG PO TABS
20.0000 mg | ORAL_TABLET | Freq: Every day | ORAL | Status: DC
  Administered 2016-01-27 – 2016-02-03 (×7): 20 mg via ORAL
  Filled 2016-01-27 (×7): qty 1

## 2016-01-27 MED ORDER — GADOBENATE DIMEGLUMINE 529 MG/ML IV SOLN
10.0000 mL | Freq: Once | INTRAVENOUS | Status: AC | PRN
  Administered 2016-01-27: 7 mL via INTRAVENOUS

## 2016-01-27 NOTE — Consult Note (Addendum)
WOC wound consult note Reason for Consult: Consult requested for right foot wounds.  MRI results are pending to R/O osteomyelitis. Wound type: Right heel with unstageable pressure injury; 8X4cm, previous blister has ruptured and has a black inner wound bed, surrounded by loose peeling skin, mod amt tan drainage, no odor Right inner anterior foot with unstageable pressure injury ; 4X3cm, 95% dry hard eschar, 5% red and dry.  No odor or drainage. Right outer foot with unstageable pressure injury; 4X1cm, 100% dry eschar. Dressing procedure/placement/frequency:  Recommend Vascular consult for RLE.  Pt had an ABI performed last year which revealed calcification and foot has declined significantly since that time with 3 unstageable wounds.  Float heel to reduce pressure; moist gauze to heel until further assessment available from vascular team.  It is best practice to leave dry stable eschar to anterior foot wounds intact. Please re-consult if further assistance is needed.  Thank-you,  Cammie Mcgee MSN, RN, CWOCN, [place], CNS [phone]

## 2016-01-27 NOTE — Consult Note (Signed)
[name] for Infectious Disease  Date of Admission:  01/26/2016  Date of Consult:  01/27/2016  Reason for Consult: MSSA bacteremia Referring Physician: CHAMP  Impression/Recommendation MSSA Bacteremia DM2 with CRI Decubitus ulcer R heel (8 x 4 cm), eschar R inner foot, R outer foot Injury R great toe  Diabetic foot order set Change anbx to ancef Await MRI Consider ortho eval Consider Vascular eval Diabetic foot order set Recheck BCx Check TTE Appreciate wound care f/u  Thank you so much for this interesting consult,   Bobby Rumpf (pager) [phone] [email or web address]  [name] is an 82 y.o. female.  HPI: 82 yo F with DM2, Churg-Strauss syndrome who presents to ED on 6-11 after 2 weeks ago injuring her R foot. Her cover came off part of her walked and she injured her foot with her walker.  She came to ED today with severe pain. In ED she was felt to be septic. She was started on vanco/zosyn.  Her BCx have since turned positive for MSSA.   Past Medical History  Diagnosis Date  . Hypertension   . Asthma   . Osteoarthritis     left shoulder  . Pseudogout   . Hypokalemia     a. Previously felt due to diuretics, req supplementation.  . 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 Sural nerve biopsy 02/2000.  Jackalyn Lombard disease     a. s/p thyroidectomy.  . Glaucoma   . Hypercholesteremia   . Venous insufficiency   . Osteopenia     DEXA 2004  . Lipoma     left inner thigh  . Cataract     left eye  . VASCULITIS   . Chronic combined systolic and diastolic heart failure ([place])     a. 02/2012 EF:20-25%  . Pleural effusion, left     a. Thoracentesis 2001 - per notes, no malignant cells, was transudative.  Marland Kitchen NSVT (nonsustained ventricular tachycardia) ([place])     a. During CHF adm 2001.  . Multifocal atrial tachycardia ([place])     a. Documented as OP 01/2012.  Marland Kitchen Ectopic atrial tachycardia ([place])     a. Documented on tele as IP 02/2012.  . Pulmonary HTN ([place])       a. Mod by echo 02/2012.  . Valvular heart disease     a. Echo 02/2012: mod MR/TR, mild AI.  Marland Kitchen Pericardial effusion     a. Echo 02/2012: small-mod pericardial effusion.  . Atrial fibrillation, permanent ([place])     a. Dx 04/2013, on metoprolol, dig and xarelto  . Shortness of breath   . CHF (congestive heart failure) ([place])   . On home oxygen therapy     "wear it mostly at night" (09/20/2014)  . Pneumonia 1930's  . Type II diabetes mellitus [facility])     Past Surgical History  Procedure Laterality Date  . Abdominal hysterectomy    . Salpingoophorectomy    . Thyroidectomy    . Hemiarthroplasty hip Right 10/2010    cemented for hip fracture, femoral neck - by Johnny Bridge, MD  . Cataract extraction Right   . Fracture surgery    . Sural nerve biopsy  02/2000     Allergies  Allergen Reactions  . Amiodarone Other (See Comments)    Contributed to hypoxia    Medications:  Scheduled: . brimonidine  1 drop Left Eye Q12H  . dorzolamide  1 drop Left Eye BID  . famotidine  20 mg Oral Daily  .  ferrous sulfate  325 mg Oral Q breakfast  . furosemide  40 mg Oral QPM  . furosemide  80 mg Oral Daily  . insulin aspart  0-15 Units Subcutaneous TID WC  . lisinopril  20 mg Oral Daily  . metoprolol succinate  100 mg Oral BID  . mometasone-formoterol  2 puff Inhalation BID  . piperacillin-tazobactam (ZOSYN)  IV  3.375 g Intravenous Q8H  . potassium chloride SA  40 mEq Oral Daily  . rivaroxaban  15 mg Oral Q supper  . spironolactone  25 mg Oral Daily    Abtx:  Anti-infectives    Start     Dose/Rate Route Frequency Ordered Stop   01/27/16 1700  vancomycin (VANCOCIN) IVPB 750 mg/150 ml premix  Status:  Discontinued     750 mg 150 mL/hr over 60 Minutes Intravenous Every 24 hours 01/26/16 1659 01/27/16 1253   01/26/16 1700  piperacillin-tazobactam (ZOSYN) IVPB 3.375 g  Status:  Discontinued     3.375 g 100 mL/hr over 30 Minutes Intravenous  Once 01/26/16 1655 01/26/16 1658   01/26/16 1700   vancomycin (VANCOCIN) IVPB 1000 mg/200 mL premix     1,000 mg 200 mL/hr over 60 Minutes Intravenous  Once 01/26/16 1655 01/26/16 1931   01/26/16 1700  piperacillin-tazobactam (ZOSYN) IVPB 3.375 g     3.375 g 12.5 mL/hr over 240 Minutes Intravenous Every 8 hours 01/26/16 1658        Total days of antibiotics: 0 vanco/zosyn          Social History:  reports that she has never smoked. She has never used smokeless tobacco. She reports that she does not drink alcohol or use illicit drugs.  Family History  Problem Relation Age of Onset  . Diabetes Mother   . Diabetes Father   . Heart disease Neg Hx     General ROS: she is oriented to person and place but not date. she lives with her daughter. she denies vision changes. denies neuropathy, denies difficulty with urine and BM. see HIPI  Blood pressure 149/94, pulse 98, temperature 98.4 F (36.9 C), temperature source Oral, resp. rate 19, SpO2 100 %. General appearance: alert, cooperative and no distress Eyes: negative findings: EOMI Throat: normal findings: oropharynx pink & moist without lesions or evidence of thrush and abnormal findings: dentition: missing. dentures Lungs: rhonchi bilaterally Heart: regular rate and rhythm Abdomen: normal findings: bowel sounds normal and soft, non-tender Extremities: edema 3+ LLE. I am unable to feel LE pulses in her feet.  and R foot with above noted wounds. heel decubitus. there is no d/c from her great toe wound. her eschar and her medila foot is tender. her heel wound is very tender.    Results for orders placed or performed during the hospital encounter of 01/26/16 (from the past 48 hour(s))  Comprehensive metabolic panel     Status: Abnormal   Collection Time: 01/26/16  4:09 PM  Result Value Ref Range   Sodium 137 135 - 145 mmol/L   Potassium 4.2 3.5 - 5.1 mmol/L   Chloride 97 (L) 101 - 111 mmol/L   CO2 30 22 - 32 mmol/L   Glucose, Bld 160 (H) 65 - 99 mg/dL   BUN 17 6 - 20 mg/dL    Creatinine, Ser 1.22 (H) 0.44 - 1.00 mg/dL   Calcium 8.3 (L) 8.9 - 10.3 mg/dL   Total Protein 8.1 6.5 - 8.1 g/dL   Albumin 3.6 3.5 - 5.0 g/dL   AST 17 15 -  41 U/L   ALT 7 (L) 14 - 54 U/L   Alkaline Phosphatase 122 38 - 126 U/L   Total Bilirubin 1.4 (H) 0.3 - 1.2 mg/dL   GFR calc non Af Amer 38 (L) >60 mL/min   GFR calc Af Amer 44 (L) >60 mL/min    Comment: (NOTE) The eGFR has been calculated using the CKD EPI equation. This calculation has not been validated in all clinical situations. eGFR's persistently <60 mL/min signify possible Chronic Kidney Disease.    Anion gap 10 5 - 15  CBC with Differential     Status: None   Collection Time: 01/26/16  4:09 PM  Result Value Ref Range   WBC 6.7 4.0 - 10.5 K/uL   RBC 4.49 3.87 - 5.11 MIL/uL   Hemoglobin 13.6 12.0 - 15.0 g/dL   HCT 42.3 36.0 - 46.0 %   MCV 94.2 78.0 - 100.0 fL   MCH 30.3 26.0 - 34.0 pg   MCHC 32.2 30.0 - 36.0 g/dL   RDW 14.8 11.5 - 15.5 %   Platelets 363 150 - 400 K/uL   Neutrophils Relative % 74 %   Neutro Abs 4.9 1.7 - 7.7 K/uL   Lymphocytes Relative 13 %   Lymphs Abs 0.9 0.7 - 4.0 K/uL   Monocytes Relative 12 %   Monocytes Absolute 0.8 0.1 - 1.0 K/uL   Eosinophils Relative 1 %   Eosinophils Absolute 0.1 0.0 - 0.7 K/uL   Basophils Relative 0 %   Basophils Absolute 0.0 0.0 - 0.1 K/uL  I-Stat beta hCG blood, ED     Status: None   Collection Time: 01/26/16  4:23 PM  Result Value Ref Range   I-stat hCG, quantitative <5.0 <5 mIU/mL   Comment 3            Comment:   GEST. AGE      CONC.  (mIU/mL)   <=1 WEEK        5 - 50     2 WEEKS       50 - 500     3 WEEKS       100 - 10,000     4 WEEKS     1,000 - 30,000        FEMALE AND NON-PREGNANT FEMALE:     LESS THAN 5 mIU/mL   I-Stat CG4 Lactic Acid, ED     Status: Abnormal   Collection Time: 01/26/16  4:25 PM  Result Value Ref Range   Lactic Acid, Venous 2.46 (HH) 0.5 - 2.0 mmol/L   Comment NOTIFIED PHYSICIAN   I-Stat CG4 Lactic Acid, ED  (not at  [facility])      Status: Abnormal   Collection Time: 01/26/16  5:47 PM  Result Value Ref Range   Lactic Acid, Venous 3.06 (HH) 0.5 - 2.0 mmol/L   Comment NOTIFIED PHYSICIAN   Blood Culture (routine x 2)     Status: None (Preliminary result)   Collection Time: 01/26/16  5:58 PM  Result Value Ref Range   Specimen Description BLOOD WRIST LEFT    Special Requests BOTTLES DRAWN AEROBIC ONLY 6CC    Culture  Setup Time      GRAM POSITIVE COCCI IN CLUSTERS AEROBIC BOTTLE ONLY CRITICAL RESULT CALLED TO, READ BACK BY AND VERIFIED WITH: R RUMBARGER,PHARMD AT 0957 01/27/16    Culture NO GROWTH < 24 HOURS    Report Status PENDING   Blood Culture ID Panel (Reflexed)     Status: Abnormal  Collection Time: 01/26/16  5:58 PM  Result Value Ref Range   Enterococcus species NOT DETECTED NOT DETECTED   Vancomycin resistance NOT DETECTED NOT DETECTED   Listeria monocytogenes NOT DETECTED NOT DETECTED   Staphylococcus species DETECTED (A) NOT DETECTED    Comment: CRITICAL RESULT CALLED TO, READ BACK BY AND VERIFIED WITH: R RUMBARGER,PHARMD AT 0957 01/27/16    Staphylococcus aureus DETECTED (A) NOT DETECTED    Comment: CRITICAL RESULT CALLED TO, READ BACK BY AND VERIFIED WITH: R RUMBARGER,PHARMD AT 0957 01/27/16    Methicillin resistance NOT DETECTED NOT DETECTED   Streptococcus species NOT DETECTED NOT DETECTED   Streptococcus agalactiae NOT DETECTED NOT DETECTED   Streptococcus pneumoniae NOT DETECTED NOT DETECTED   Streptococcus pyogenes NOT DETECTED NOT DETECTED   Acinetobacter baumannii NOT DETECTED NOT DETECTED   Enterobacteriaceae species NOT DETECTED NOT DETECTED   Enterobacter cloacae complex NOT DETECTED NOT DETECTED   Escherichia coli NOT DETECTED NOT DETECTED   Klebsiella oxytoca NOT DETECTED NOT DETECTED   Klebsiella pneumoniae NOT DETECTED NOT DETECTED   Proteus species NOT DETECTED NOT DETECTED   Serratia marcescens NOT DETECTED NOT DETECTED   Carbapenem resistance NOT DETECTED NOT DETECTED    Haemophilus influenzae NOT DETECTED NOT DETECTED   Neisseria meningitidis NOT DETECTED NOT DETECTED   Pseudomonas aeruginosa NOT DETECTED NOT DETECTED   Candida albicans NOT DETECTED NOT DETECTED   Candida glabrata NOT DETECTED NOT DETECTED   Candida krusei NOT DETECTED NOT DETECTED   Candida parapsilosis NOT DETECTED NOT DETECTED   Candida tropicalis NOT DETECTED NOT DETECTED  Blood Culture (routine x 2)     Status: None (Preliminary result)   Collection Time: 01/26/16  6:01 PM  Result Value Ref Range   Specimen Description BLOOD RIGHT HAND    Special Requests BOTTLES DRAWN AEROBIC AND ANAEROBIC 5CC    Culture  Setup Time      GRAM POSITIVE COCCI IN CLUSTERS IN BOTH AEROBIC AND ANAEROBIC BOTTLES CRITICAL RESULT CALLED TO, READ BACK BY AND VERIFIED WITH: R RUMBARGER,PHARMD AT 0957 01/27/16    Culture NO GROWTH < 24 HOURS    Report Status PENDING   I-Stat CG4 Lactic Acid, ED     Status: None   Collection Time: 01/26/16  9:17 PM  Result Value Ref Range   Lactic Acid, Venous 1.94 0.5 - 2.0 mmol/L  Urinalysis, Routine w reflex microscopic (not at [facility])     Status: Abnormal   Collection Time: 01/27/16  1:15 AM  Result Value Ref Range   Color, Urine YELLOW YELLOW   APPearance CLEAR CLEAR   Specific Gravity, Urine 1.010 1.005 - 1.030   pH 5.5 5.0 - 8.0   Glucose, UA NEGATIVE NEGATIVE mg/dL   Hgb urine dipstick TRACE (A) NEGATIVE   Bilirubin Urine NEGATIVE NEGATIVE   Ketones, ur NEGATIVE NEGATIVE mg/dL   Protein, ur 30 (A) NEGATIVE mg/dL   Nitrite NEGATIVE NEGATIVE   Leukocytes, UA NEGATIVE NEGATIVE  Urine microscopic-add on     Status: Abnormal   Collection Time: 01/27/16  1:15 AM  Result Value Ref Range   Squamous Epithelial / LPF 6-30 (A) NONE SEEN   WBC, UA 0-5 0 - 5 WBC/hpf   RBC / HPF 0-5 0 - 5 RBC/hpf   Bacteria, UA FEW (A) NONE SEEN   Urine-Other MUCOUS PRESENT   CBC     Status: None   Collection Time: 01/27/16  4:52 AM  Result Value Ref Range   WBC 8.3 4.0 - 10.5 K/uL  RBC 4.20 3.87 - 5.11 MIL/uL   Hemoglobin 12.5 12.0 - 15.0 g/dL   HCT 40.5 36.0 - 46.0 %   MCV 96.4 78.0 - 100.0 fL   MCH 29.8 26.0 - 34.0 pg   MCHC 30.9 30.0 - 36.0 g/dL   RDW 15.1 11.5 - 15.5 %   Platelets 303 150 - 400 K/uL  Basic metabolic panel     Status: Abnormal   Collection Time: 01/27/16  4:52 AM  Result Value Ref Range   Sodium 138 135 - 145 mmol/L   Potassium 3.7 3.5 - 5.1 mmol/L   Chloride 98 (L) 101 - 111 mmol/L   CO2 29 22 - 32 mmol/L   Glucose, Bld 124 (H) 65 - 99 mg/dL   BUN 16 6 - 20 mg/dL   Creatinine, Ser 1.16 (H) 0.44 - 1.00 mg/dL   Calcium 8.0 (L) 8.9 - 10.3 mg/dL   GFR calc non Af Amer 41 (L) >60 mL/min   GFR calc Af Amer 47 (L) >60 mL/min    Comment: (NOTE) The eGFR has been calculated using the CKD EPI equation. This calculation has not been validated in all clinical situations. eGFR's persistently <60 mL/min signify possible Chronic Kidney Disease.    Anion gap 11 5 - 15  Glucose, capillary     Status: Abnormal   Collection Time: 01/27/16  7:42 AM  Result Value Ref Range   Glucose-Capillary 104 (H) 65 - 99 mg/dL  Glucose, capillary     Status: Abnormal   Collection Time: 01/27/16 11:34 AM  Result Value Ref Range   Glucose-Capillary 217 (H) 65 - 99 mg/dL      Component Value Date/Time   SDES BLOOD RIGHT HAND 01/26/2016 1801   SPECREQUEST BOTTLES DRAWN AEROBIC AND ANAEROBIC 5CC 01/26/2016 1801   CULT NO GROWTH < 24 HOURS 01/26/2016 1801   REPTSTATUS PENDING 01/26/2016 1801   Dg Chest 2 View  01/26/2016  CLINICAL DATA:  Sepsis. EXAM: CHEST  2 VIEW COMPARISON:  Chest radiographs dated 11/24/2014 and chest CT dated 05/03/2015. FINDINGS: Stable enlarged cardiac silhouette and left basilar scarring. The trachea remains deviated to the right at the thoracic inlet. Diffuse osteopenia. IMPRESSION: 1. No acute abnormality. 2. Stable cardiomegaly and left basilar scarring. 3. Stable tracheal deviation to the right at the level of the thoracic inlet due to  a previously demonstrated large thyroid nodule. Electronically Signed   By: Claudie Revering M.D.   On: 01/26/2016 19:39   Dg Foot Complete Right  01/26/2016  CLINICAL DATA:  Right foot wounds, diabetes EXAM: RIGHT FOOT COMPLETE - 3+ VIEW COMPARISON:  12/09/2013 FINDINGS: Three views of the right foot submitted. There is diffuse osteopenia. Mild soft tissue swelling adjacent to distal aspect of first metatarsal. No acute fracture or subluxation. Atherosclerotic vascular calcifications are noted. No bone destruction to suggest osteomyelitis. There is plantar spur of calcaneus. Dorsal spurring anterior aspect of the talus. IMPRESSION: No acute fracture or subluxation. Limited study by diffuse osteopenia. Mild soft tissue swelling adjacent to distal aspect of first metatarsal. No evidence of bone destruction to suggest osteomyelitis. Plantar spur of calcaneus. Degenerative changes with dorsal spurring anterior aspect of the talus. Electronically Signed   By: Lahoma Crocker M.D.   On: 01/26/2016 17:00   Recent Results (from the past 240 hour(s))  Blood Culture (routine x 2)     Status: None (Preliminary result)   Collection Time: 01/26/16  5:58 PM  Result Value Ref Range Status   Specimen Description  BLOOD WRIST LEFT  Final   Special Requests BOTTLES DRAWN AEROBIC ONLY 6CC  Final   Culture  Setup Time   Final    GRAM POSITIVE COCCI IN CLUSTERS AEROBIC BOTTLE ONLY CRITICAL RESULT CALLED TO, READ BACK BY AND VERIFIED WITH: R RUMBARGER,PHARMD AT 5537 01/27/16    Culture NO GROWTH < 24 HOURS  Final   Report Status PENDING  Incomplete  Blood Culture ID Panel (Reflexed)     Status: Abnormal   Collection Time: 01/26/16  5:58 PM  Result Value Ref Range Status   Enterococcus species NOT DETECTED NOT DETECTED Final   Vancomycin resistance NOT DETECTED NOT DETECTED Final   Listeria monocytogenes NOT DETECTED NOT DETECTED Final   Staphylococcus species DETECTED (A) NOT DETECTED Final    Comment: CRITICAL RESULT  CALLED TO, READ BACK BY AND VERIFIED WITH: R RUMBARGER,PHARMD AT 0957 01/27/16    Staphylococcus aureus DETECTED (A) NOT DETECTED Final    Comment: CRITICAL RESULT CALLED TO, READ BACK BY AND VERIFIED WITH: R RUMBARGER,PHARMD AT 0957 01/27/16    Methicillin resistance NOT DETECTED NOT DETECTED Final   Streptococcus species NOT DETECTED NOT DETECTED Final   Streptococcus agalactiae NOT DETECTED NOT DETECTED Final   Streptococcus pneumoniae NOT DETECTED NOT DETECTED Final   Streptococcus pyogenes NOT DETECTED NOT DETECTED Final   Acinetobacter baumannii NOT DETECTED NOT DETECTED Final   Enterobacteriaceae species NOT DETECTED NOT DETECTED Final   Enterobacter cloacae complex NOT DETECTED NOT DETECTED Final   Escherichia coli NOT DETECTED NOT DETECTED Final   Klebsiella oxytoca NOT DETECTED NOT DETECTED Final   Klebsiella pneumoniae NOT DETECTED NOT DETECTED Final   Proteus species NOT DETECTED NOT DETECTED Final   Serratia marcescens NOT DETECTED NOT DETECTED Final   Carbapenem resistance NOT DETECTED NOT DETECTED Final   Haemophilus influenzae NOT DETECTED NOT DETECTED Final   Neisseria meningitidis NOT DETECTED NOT DETECTED Final   Pseudomonas aeruginosa NOT DETECTED NOT DETECTED Final   Candida albicans NOT DETECTED NOT DETECTED Final   Candida glabrata NOT DETECTED NOT DETECTED Final   Candida krusei NOT DETECTED NOT DETECTED Final   Candida parapsilosis NOT DETECTED NOT DETECTED Final   Candida tropicalis NOT DETECTED NOT DETECTED Final  Blood Culture (routine x 2)     Status: None (Preliminary result)   Collection Time: 01/26/16  6:01 PM  Result Value Ref Range Status   Specimen Description BLOOD RIGHT HAND  Final   Special Requests BOTTLES DRAWN AEROBIC AND ANAEROBIC 5CC  Final   Culture  Setup Time   Final    GRAM POSITIVE COCCI IN CLUSTERS IN BOTH AEROBIC AND ANAEROBIC BOTTLES CRITICAL RESULT CALLED TO, READ BACK BY AND VERIFIED WITH: R RUMBARGER,PHARMD AT 0957 01/27/16     Culture NO GROWTH < 24 HOURS  Final   Report Status PENDING  Incomplete      01/27/2016, 1:06 PM     LOS: 0 days    Records and images were personally reviewed where available.

## 2016-01-27 NOTE — Progress Notes (Signed)
Consult Note  Patient name: [name] MRN: [id number] DOB: 1926-07-13 Sex: female  Consulting Physician:  Dr. Arbutus Leas  Reason for Consult:  Chief Complaint  Patient presents with  . Foot Pain    HISTORY OF PRESENT ILLNESS: This is a 82 year old female who presented to the ER with worsening diabetic foot infection.  The injury occurred about 2 weeks ago.  The patient ambulates with a walker.  She has been started on IV abx.  Blood cx are positive for MSSA.  She has normal renal function and a normal WBC  She takes Xaralto for AFIB.  She is a type II diabetic with Hb A1c of 7.5 in 08/2015.  She is on a ACE-I for htn.  ABI's 1 year ago were falsely elevated.  Toe pressures were 76 on the right and 124 on the left.  ABI's have been re-ordered    Past Medical History  Diagnosis Date  . Hypertension   . Asthma   . Osteoarthritis     left shoulder  . Pseudogout   . Hypokalemia     a. Previously felt due to diuretics, req supplementation.  . Churg-Strauss syndrome ([facility])     a. Sural nerve biopsy 02/2000.  Darene Lamer disease     a. s/p thyroidectomy.  . Glaucoma   . Hypercholesteremia   . Venous insufficiency   . Osteopenia     DEXA 2004  . Lipoma     left inner thigh  . Cataract     left eye  . VASCULITIS   . Chronic combined systolic and diastolic heart failure ([facility])     a. 02/2012 EF:20-25%  . Pleural effusion, left     a. Thoracentesis 2001 - per notes, no malignant cells, was transudative.  Marland Kitchen NSVT (nonsustained ventricular tachycardia) ([facility])     a. During CHF adm 2001.  . Multifocal atrial tachycardia ([facility])     a. Documented as OP 01/2012.  Marland Kitchen Ectopic atrial tachycardia ([facility])     a. Documented on tele as IP 02/2012.  . Pulmonary HTN ([facility])     a. Mod by echo 02/2012.  . Valvular heart disease     a. Echo 02/2012: mod MR/TR, mild AI.  Marland Kitchen Pericardial effusion     a. Echo 02/2012: small-mod pericardial effusion.  . Atrial fibrillation, permanent ([facility])     a. Dx 04/2013,  on metoprolol, dig and xarelto  . Shortness of breath   . CHF (congestive heart failure) ([facility])   . On home oxygen therapy     "wear it mostly at night" (09/20/2014)  . Pneumonia 1930's  . Type II diabetes mellitus [facility])     Past Surgical History  Procedure Laterality Date  . Abdominal hysterectomy    . Salpingoophorectomy    . Thyroidectomy    . Hemiarthroplasty hip Right 10/2010    cemented for hip fracture, femoral neck - by Eulas Post, MD  . Cataract extraction Right   . Fracture surgery    . Sural nerve biopsy  02/2000    Social History   Social History  . Marital Status: Widowed    Spouse Name: N/A  . Number of Children: N/A  . Years of Education: N/A   Occupational History  . Retired    Social History Main Topics  . Smoking status: Never Smoker   . Smokeless tobacco: Never Used  . Alcohol Use: No  . Drug Use: No  .  Sexual Activity: Not on file   Other Topics Concern  . Not on file   Social History Narrative   Lives in house, with family just next door.    Never smoker, no alcohol.      Used to work at [employer].    Before that, worked in food prep.                    Family History  Problem Relation Age of Onset  . Diabetes Mother   . Diabetes Father   . Heart disease Neg Hx     Allergies as of 01/26/2016 - Review Complete 01/26/2016  Allergen Reaction Noted  . Amiodarone Other (See Comments) 09/27/2014    No current facility-administered medications on file prior to encounter.   Current Outpatient Prescriptions on File Prior to Encounter  Medication Sig Dispense Refill  . acetaminophen-codeine (TYLENOL #3) 300-30 MG tablet Take 1 tablet by mouth every 8 (eight) hours as needed (for pain). 21 tablet 0  . albuterol (PROVENTIL HFA;VENTOLIN HFA) 108 (90 Base) MCG/ACT inhaler Inhale 2 puffs into the lungs every 6 (six) hours as needed for wheezing or shortness of breath. 1 Inhaler 2  . BD ULTRA-FINE LANCETS lancets Use to  check blood sugar one time daily 100 each 5  . brimonidine (ALPHAGAN P) 0.1 % SOLN Place 1 drop into the left eye every 12 (twelve) hours. 45 mL 0  . dorzolamide (TRUSOPT) 2 % ophthalmic solution Place 1 drop into the left eye 2 (two) times daily. 10 mL 3  . DULERA 100-5 MCG/ACT AERO INHALE 2 PUFFS INTO THE LUNGS TWICE DAILY 13 g 3  . ferrous sulfate 325 (65 FE) MG tablet TAKE 1 TABLET(325 MG) BY MOUTH DAILY 30 tablet 3  . furosemide (LASIX) 80 MG tablet Take 80mg  in the AM and 40mg  in the PM 45 tablet 3  . glucose blood test strip Use as instructed 100 each 12  . lisinopril (PRINIVIL,ZESTRIL) 20 MG tablet Take 1 tablet (20 mg total) by mouth daily. 30 tablet 3  . metFORMIN (GLUCOPHAGE) 1000 MG tablet TAKE 1 TABLET(1000 MG) BY MOUTH TWICE DAILY WITH A MEAL 180 tablet 1  . metoprolol succinate (TOPROL-XL) 100 MG 24 hr tablet Take 1 tablet twice daily. 60 tablet 3  . potassium chloride SA (K-DUR,KLOR-CON) 20 MEQ tablet Take 2 tablets (40 mEq total) by mouth daily. Only take this medication on days you take Lasix. 60 tablet 0  . ranitidine (ZANTAC) 75 MG tablet Take 75 mg by mouth daily as needed for heartburn.    . rivaroxaban (XARELTO) 20 MG TABS tablet Take 20 mg by mouth daily with supper.    Marland Kitchen spironolactone (ALDACTONE) 25 MG tablet TAKE 1 TABLET BY MOUTH EVERY DAY 90 tablet 1     REVIEW OF SYSTEMS: See history of present illness for pertinent positives and negatives  PHYSICAL EXAMINATION: General: The patient appears their stated age.  Vital signs are BP 125/57 mmHg  Pulse 86  Temp(Src) 98.3 F (36.8 C) (Oral)  Resp 20  SpO2 98% Pulmonary: Respirations are non-labored HEENT:  No gross abnormalities Abdomen: Soft and non-tender  Musculoskeletal: There are no major deformities.   Neurologic: No focal weakness or paresthesias are detected, Skin: Right heel and foot ulcer Psychiatric: The patient has normal affect. Cardiovascular: There is a regular rate and rhythm without  significant murmur appreciated.  Diagnostic Studies: Arterial Doppler studies pending   Assessment:  Right foot ulcer Plan: Discussed  with the patient proceeding with angiography to better evaluate perfusion to her right leg and to possibly intervene if indicated for limb salvage.  The patient is on Xaralto.  I will stop this today with plans for angiography on Wednesday     V. Charlena Cross, M.D. [facility] Office: [phone] Pager:  [phone]

## 2016-01-27 NOTE — ED Notes (Signed)
Spoke to [employer] in pharmacy, OK to give Zosyn now.

## 2016-01-27 NOTE — Progress Notes (Signed)
PHARMACY - PHYSICIAN COMMUNICATION CRITICAL VALUE ALERT - BLOOD CULTURE IDENTIFICATION (BCID)  Results for orders placed or performed during the hospital encounter of 01/26/16  Blood Culture ID Panel (Reflexed) (Collected: 01/26/2016  5:58 PM)  Result Value Ref Range   Enterococcus species NOT DETECTED NOT DETECTED   Vancomycin resistance NOT DETECTED NOT DETECTED   Listeria monocytogenes NOT DETECTED NOT DETECTED   Staphylococcus species DETECTED (A) NOT DETECTED   Staphylococcus aureus DETECTED (A) NOT DETECTED   Methicillin resistance NOT DETECTED NOT DETECTED   Streptococcus species NOT DETECTED NOT DETECTED   Streptococcus agalactiae NOT DETECTED NOT DETECTED   Streptococcus pneumoniae NOT DETECTED NOT DETECTED   Streptococcus pyogenes NOT DETECTED NOT DETECTED   Acinetobacter baumannii NOT DETECTED NOT DETECTED   Enterobacteriaceae species NOT DETECTED NOT DETECTED   Enterobacter cloacae complex NOT DETECTED NOT DETECTED   Escherichia coli NOT DETECTED NOT DETECTED   Klebsiella oxytoca NOT DETECTED NOT DETECTED   Klebsiella pneumoniae NOT DETECTED NOT DETECTED   Proteus species NOT DETECTED NOT DETECTED   Serratia marcescens NOT DETECTED NOT DETECTED   Carbapenem resistance NOT DETECTED NOT DETECTED   Haemophilus influenzae NOT DETECTED NOT DETECTED   Neisseria meningitidis NOT DETECTED NOT DETECTED   Pseudomonas aeruginosa NOT DETECTED NOT DETECTED   Candida albicans NOT DETECTED NOT DETECTED   Candida glabrata NOT DETECTED NOT DETECTED   Candida krusei NOT DETECTED NOT DETECTED   Candida parapsilosis NOT DETECTED NOT DETECTED   Candida tropicalis NOT DETECTED NOT DETECTED    Name of physician (or Provider) Contacted: Tat  Changes to prescribed antibiotics required: Currently on vancomycin and zosyn, recommended de-escalating to cefazolin  Hosam Mcfetridge, Drake Leach 01/27/2016  10:08 AM

## 2016-01-27 NOTE — Progress Notes (Addendum)
PROGRESS NOTE  [name] ZOX:[id number] DOB: Apr 10, 1926 DOA: 01/26/2016 PCP: Evelena Peat, DO  Brief History:  82 year old female with a history of diabetes mellitus, atrial fibrillation, hypertension, Churg-Strauss syndrome, combined systolic and diastolic CHF presented with concerns of diabetic foot infection. The patient normally lives with her daughter who stated that one of the rubber shoes came off of the 4 point walker causing the exposed metal frame to come down on the patient's foot resulting in injury approximately 2 weeks ago. There was concern of increasing pain and the foot looking worse resulting in the patient coming to the hospital. Upon presentation, the patient was noted to have a low-grade temperature with tachycardia and lactic acid up to 3.06. The patient was initially started on vancomycin and Zosyn. Blood cultures ultimately grew MSSA and antibiotics have been narrowed to cefazolin.    Assessment/Plan: Sepsis  -present at time of admission -secondary to MSSA bacteremia -lactic acid improved with IVF -continue cefazolin -ID following  MSSA bacteremia -source is foot -ID following -echo -surveillance blood cultures  Peripheral vascular disease with ischemic foot changes -anterior foot with eschar without erythema or drainage -vascular surgery consult--spoke with Dr. Floreen Comber see 6/12 -04/09/2015 bilateral ABIs falsely Elevated -04/09/2015 R toe TBI --0.46;  L-toe--0.75 -decubitus heel ulcer--present at time of admission- -appreciate wound care recommendations -01/27/2016 MRI foot erosive lesion medial head first metatarsal with low-level edema and enhancement; diffuse soft tissue edema of the ankle and foot  diabetes mellitus type 2 with renal manifestations -09/14/2015 hemoglobin A1c 7.5 -Continue NovoLog sliding scale  Chronic systolic CHF/NICM -09/21/2014 echo EF 20-25%, severe diffuse HK, severe TR, severe RV dyfx -Continue furosemide home  dose--80 am/40pm -Continue lisinopril and metoprolol succinate  Chronic Atrial Fibrillation -CHADS-VASc = 6 -rate controlled -continue xarelto--decrease dose to 15 mg daily due to CrCl < 30 -Continue metoprolol succinate    Disposition Plan:   Home in 3-4 days  Family Communication:   No Family at beside--total time 40 min; [id number]  Consultants:  ID, Vascular surgery  Code Status:  FULL    Subjective:  patient denies any fever, chills, chest pain, shortness breath, nausea, vomiting, diarrhea. No abdominal pain. Complains of pain in the right foot. Denies headache or neck pain.   Objective: Filed Vitals:   01/27/16 0115 01/27/16 0419 01/27/16 0745 01/27/16 0823  BP: 126/78 147/60 149/94   Pulse:  99 98   Temp:  97.7 F (36.5 C) 98.4 F (36.9 C)   TempSrc:  Axillary Oral   Resp:  18 19   SpO2:  97% 100% 100%    Intake/Output Summary (Last 24 hours) at 01/27/16 1629 Last data filed at 01/27/16 1449  Gross per 24 hour  Intake    360 ml  Output      0 ml  Net    360 ml   Weight change:  Exam:   General:  Pt is alert, follows commands appropriately, not in acute distress  HEENT: No icterus, No thrush, No neck mass, [place]/AT  Cardiovascular: IRRR, S1/S2, no rubs, no gallops  Respiratory: CTA bilaterally, no wheezing, no crackles, no rhonchi  Abdomen: Soft/+BS, non tender, non distended, no guarding Extremities: Bilateral crackles, left greater than right. No wheezing. Good air movement. Trace ankle edema.  Data Reviewed: I have personally reviewed following labs and imaging studies Basic Metabolic Panel:  Recent Labs Lab 01/22/16 1615 01/26/16 1458 01/26/16 1609 01/27/16 0452  NA 136 138 137 138  K 3.7 4.1 4.2 3.7  CL 101 95* 97* 98*  CO2 25  --  30 29  GLUCOSE 150* 174* 160* 124*  BUN CREATININE 1.17* 1.10* 1.22* 1.16*  CALCIUM 8.4*  --  8.3* 8.0*   Liver Function Tests:  Recent Labs Lab 01/26/16 1609  AST 17  ALT 7*  ALKPHOS  122  BILITOT 1.4*  PROT 8.1  ALBUMIN 3.6   No results for input(s): LIPASE, AMYLASE in the last 168 hours. No results for input(s): AMMONIA in the last 168 hours. Coagulation Profile: No results for input(s): INR, PROTIME in the last 168 hours. CBC:  Recent Labs Lab 01/22/16 1615 01/26/16 1458 01/26/16 1609 01/27/16 0452  WBC 4.0  --  6.7 8.3  NEUTROABS  --   --  4.9  --   HGB 12.1 14.6 13.6 12.5  HCT 38.1 43.0 42.3 40.5  MCV 92.5  --  94.2 96.4  PLT 294  --  363 303   Cardiac Enzymes: No results for input(s): CKTOTAL, CKMB, CKMBINDEX, TROPONINI in the last 168 hours. BNP: Invalid input(s): POCBNP CBG:  Recent Labs Lab 01/27/16 0742 01/27/16 1134  GLUCAP 104* 217*   HbA1C: No results for input(s): HGBA1C in the last 72 hours. Urine analysis:    Component Value Date/Time   COLORURINE YELLOW 01/27/2016 0115   APPEARANCEUR CLEAR 01/27/2016 0115   LABSPEC 1.010 01/27/2016 0115   PHURINE 5.5 01/27/2016 0115   GLUCOSEU NEGATIVE 01/27/2016 0115   HGBUR TRACE* 01/27/2016 0115   HGBUR trace-lysed 07/17/2008 0902   BILIRUBINUR NEGATIVE 01/27/2016 0115   KETONESUR NEGATIVE 01/27/2016 0115   PROTEINUR 30* 01/27/2016 0115   UROBILINOGEN 1.0 02/17/2014 1259   NITRITE NEGATIVE 01/27/2016 0115   LEUKOCYTESUR NEGATIVE 01/27/2016 0115   Sepsis Labs: (procalcitonin:4,lacticidven:4) ) Recent Results (from the past 240 hour(s))  Blood Culture (routine x 2)     Status: None (Preliminary result)   Collection Time: 01/26/16  5:58 PM  Result Value Ref Range Status   Specimen Description BLOOD WRIST LEFT  Final   Special Requests BOTTLES DRAWN AEROBIC ONLY 6CC  Final   Culture  Setup Time   Final    GRAM POSITIVE COCCI IN CLUSTERS AEROBIC BOTTLE ONLY CRITICAL RESULT CALLED TO, READ BACK BY AND VERIFIED WITH: R RUMBARGER,PHARMD AT 0957 01/27/16    Culture NO GROWTH < 24 HOURS  Final   Report Status PENDING  Incomplete  Blood Culture ID Panel (Reflexed)     Status:  Abnormal   Collection Time: 01/26/16  5:58 PM  Result Value Ref Range Status   Enterococcus species NOT DETECTED NOT DETECTED Final   Vancomycin resistance NOT DETECTED NOT DETECTED Final   Listeria monocytogenes NOT DETECTED NOT DETECTED Final   Staphylococcus species DETECTED (A) NOT DETECTED Final    Comment: CRITICAL RESULT CALLED TO, READ BACK BY AND VERIFIED WITH: R RUMBARGER,PHARMD AT 0957 01/27/16    Staphylococcus aureus DETECTED (A) NOT DETECTED Final    Comment: CRITICAL RESULT CALLED TO, READ BACK BY AND VERIFIED WITH: R RUMBARGER,PHARMD AT 0957 01/27/16    Methicillin resistance NOT DETECTED NOT DETECTED Final   Streptococcus species NOT DETECTED NOT DETECTED Final   Streptococcus agalactiae NOT DETECTED NOT DETECTED Final   Streptococcus pneumoniae NOT DETECTED NOT DETECTED Final   Streptococcus pyogenes NOT DETECTED NOT DETECTED Final   Acinetobacter baumannii NOT DETECTED NOT DETECTED Final   Enterobacteriaceae species NOT DETECTED NOT DETECTED Final   Enterobacter cloacae complex NOT  DETECTED NOT DETECTED Final   Escherichia coli NOT DETECTED NOT DETECTED Final   Klebsiella oxytoca NOT DETECTED NOT DETECTED Final   Klebsiella pneumoniae NOT DETECTED NOT DETECTED Final   Proteus species NOT DETECTED NOT DETECTED Final   Serratia marcescens NOT DETECTED NOT DETECTED Final   Carbapenem resistance NOT DETECTED NOT DETECTED Final   Haemophilus influenzae NOT DETECTED NOT DETECTED Final   Neisseria meningitidis NOT DETECTED NOT DETECTED Final   Pseudomonas aeruginosa NOT DETECTED NOT DETECTED Final   Candida albicans NOT DETECTED NOT DETECTED Final   Candida glabrata NOT DETECTED NOT DETECTED Final   Candida krusei NOT DETECTED NOT DETECTED Final   Candida parapsilosis NOT DETECTED NOT DETECTED Final   Candida tropicalis NOT DETECTED NOT DETECTED Final  Blood Culture (routine x 2)     Status: None (Preliminary result)   Collection Time: 01/26/16  6:01 PM  Result Value  Ref Range Status   Specimen Description BLOOD RIGHT HAND  Final   Special Requests BOTTLES DRAWN AEROBIC AND ANAEROBIC 5CC  Final   Culture  Setup Time   Final    GRAM POSITIVE COCCI IN CLUSTERS IN BOTH AEROBIC AND ANAEROBIC BOTTLES CRITICAL RESULT CALLED TO, READ BACK BY AND VERIFIED WITH: R RUMBARGER,PHARMD AT 0957 01/27/16    Culture NO GROWTH < 24 HOURS  Final   Report Status PENDING  Incomplete     Scheduled Meds: . brimonidine  1 drop Left Eye Q12H  .  ceFAZolin (ANCEF) IV  1 g Intravenous Q8H  . dorzolamide  1 drop Left Eye BID  . famotidine  20 mg Oral Daily  . ferrous sulfate  325 mg Oral Q breakfast  . furosemide  40 mg Oral QPM  . furosemide  80 mg Oral Daily  . insulin aspart  0-15 Units Subcutaneous TID WC  . lisinopril  20 mg Oral Daily  . metoprolol succinate  100 mg Oral BID  . mometasone-formoterol  2 puff Inhalation BID  . potassium chloride SA  40 mEq Oral Daily  . rivaroxaban  15 mg Oral Q supper  . spironolactone  25 mg Oral Daily   Continuous Infusions:   Procedures/Studies: Dg Chest 2 View  01/26/2016  CLINICAL DATA:  Sepsis. EXAM: CHEST  2 VIEW COMPARISON:  Chest radiographs dated 11/24/2014 and chest CT dated 05/03/2015. FINDINGS: Stable enlarged cardiac silhouette and left basilar scarring. The trachea remains deviated to the right at the thoracic inlet. Diffuse osteopenia. IMPRESSION: 1. No acute abnormality. 2. Stable cardiomegaly and left basilar scarring. 3. Stable tracheal deviation to the right at the level of the thoracic inlet due to a previously demonstrated large thyroid nodule. Electronically Signed   By: Beckie Salts M.D.   On: 01/26/2016 19:39   Mr Foot Right W Wo Contrast  01/27/2016  CLINICAL DATA:  Foot pain with draining wounds.  Heel pain. EXAM: MRI OF THE RIGHT FOREFOOT WITHOUT AND WITH CONTRAST TECHNIQUE: Multiplanar, multisequence MR imaging was performed both before and after administration of intravenous contrast. CONTRAST:  7mL  MULTIHANCE GADOBENATE DIMEGLUMINE 529 MG/ML IV SOLN COMPARISON:  01/26/2016 FINDINGS: Osteomyelitis protocol MRI of the foot was obtained, to include the entire foot and ankle. This protocol uses a large field of view to cover the entire foot and ankle, and is suitable for assessing bony structures for osteomyelitis. Due to the large field of view and imaging plane choice, this protocol is less sensitive for assessing small structures such as ligamentous structures of the foot and ankle,  compared to a dedicated forefoot or dedicated hindfoot exam. Despite efforts by the technologist and patient, motion artifact is present on today's exam and could not be eliminated. This reduces exam sensitivity and specificity. There is diffuse subcutaneous edema in the ankle, heel, and foot. In the head of the first metatarsal there is an erosive lesion dorsum medially. Reportedly this is in the vicinity of a skin wound, although the wound is poorly seen on today's MRI. Differential diagnostic considerations include early osteomyelitis versus gout arthropathy. No other osseous edema or significant abnormal osseous enhancement observed to suggest osteomyelitis elsewhere. Incidental note is made of a degenerative subcortical cyst or geode distally in the middle cuneiform. No malalignment at the Lisfranc joint. Thickened plantar fascia raises possibility plantar fasciitis. No drainable abscess. No significant joint effusion to further suggest septic joint. IMPRESSION: 1. Erosive lesion along the dorsal -medial head of the first metatarsal with associated low-level edema and enhancement. This resembles a gout lesion or erosive arthropathy, but by report there is a draining wound in this vicinity which increases the likelihood that this could represent localized early osteomyelitis. 2. Diffuse soft tissue edema in the ankle and foot, nonspecific for bland edema versus cellulitis. 3. Thickened plantar fascia suggesting plantar  fasciitis. 4. This final interpretation correlates with the preliminary report issued by Dr. Cherly Hensen. Electronically Signed   By: Gaylyn Rong M.D.   On: 01/27/2016 13:32   Dg Foot Complete Right  01/26/2016  CLINICAL DATA:  Right foot wounds, diabetes EXAM: RIGHT FOOT COMPLETE - 3+ VIEW COMPARISON:  12/09/2013 FINDINGS: Three views of the right foot submitted. There is diffuse osteopenia. Mild soft tissue swelling adjacent to distal aspect of first metatarsal. No acute fracture or subluxation. Atherosclerotic vascular calcifications are noted. No bone destruction to suggest osteomyelitis. There is plantar spur of calcaneus. Dorsal spurring anterior aspect of the talus. IMPRESSION: No acute fracture or subluxation. Limited study by diffuse osteopenia. Mild soft tissue swelling adjacent to distal aspect of first metatarsal. No evidence of bone destruction to suggest osteomyelitis. Plantar spur of calcaneus. Degenerative changes with dorsal spurring anterior aspect of the talus. Electronically Signed   By: Natasha Mead M.D.   On: 01/26/2016 17:00    Kurt Hoffmeier, DO  [facility] Pager [phone]  If 7PM-7AM, please contact night-coverage [email or web address] Password [email or web address] 01/27/2016, 4:29 PM   LOS: 0 days

## 2016-01-27 NOTE — H&P (Signed)
History and Physical    [name] ZOX:[id number] DOB: 06/12/1926 DOA: 01/26/2016  PCP: Evelena Peat, DO  Patient coming from: home  Chief Complaint: "She stepped on her toe with her walker."  HPI: [name] is a 82 y.o. female with medical history significant of diabetes, atrial fibrillation presented to the emergency room  with complaint of diabetic foot infection. Patient is accompanied by her daughter. Patient had been using a 4 point walker, one of the rubber shoes came off of the 4 point Walker.The metal that was exposed came down patient's foot and injured it. This occurred about 2 weeks ago .Patient's foot initially looked better. Per the patient's daughter her other daughter put some "cream" on it. Patient's foot though has worsened over the last week. Patient was brought to the emergency room tonight because she was complaining of pain so much.  ED Course: Patient initially w/ lactic acidosis. Found to be septic. Subsequent alert call. Patient awake and Zosyn. Patient has enough fluids. Lactic acidosis resolved. Requested that emergency room provider assess back of patient's right heel for possible laceration repair.  Review of Systems: As per HPI otherwise 10 point review of systems negative.   Past Medical History  Diagnosis Date  . Hypertension   . Asthma   . Osteoarthritis     left shoulder  . Pseudogout   . Hypokalemia     a. Previously felt due to diuretics, req supplementation.  . Churg-Strauss syndrome (HCC)     a. Sural nerve biopsy 02/2000.  Darene Lamer disease     a. s/p thyroidectomy.  . Glaucoma   . Hypercholesteremia   . Venous insufficiency   . Osteopenia     DEXA 2004  . Lipoma     left inner thigh  . Cataract     left eye  . VASCULITIS   . Chronic combined systolic and diastolic heart failure (HCC)     a. 02/2012 EF:20-25%  . Pleural effusion, left     a. Thoracentesis 2001 - per notes, no malignant cells, was transudative.  Marland Kitchen NSVT (nonsustained  ventricular tachycardia) (HCC)     a. During CHF adm 2001.  . Multifocal atrial tachycardia (HCC)     a. Documented as OP 01/2012.  Marland Kitchen Ectopic atrial tachycardia (HCC)     a. Documented on tele as IP 02/2012.  . Pulmonary HTN (HCC)     a. Mod by echo 02/2012.  . Valvular heart disease     a. Echo 02/2012: mod MR/TR, mild AI.  Marland Kitchen Pericardial effusion     a. Echo 02/2012: small-mod pericardial effusion.  . Atrial fibrillation, permanent (HCC)     a. Dx 04/2013, on metoprolol, dig and xarelto  . Shortness of breath   . CHF (congestive heart failure) (HCC)   . On home oxygen therapy     "wear it mostly at night" (09/20/2014)  . Pneumonia 1930's  . Type II diabetes mellitus [facility])     Past Surgical History  Procedure Laterality Date  . Abdominal hysterectomy    . Salpingoophorectomy    . Thyroidectomy    . Hemiarthroplasty hip Right 10/2010    cemented for hip fracture, femoral neck - by Eulas Post, MD  . Cataract extraction Right   . Fracture surgery    . Sural nerve biopsy  02/2000     reports that she has never smoked. She has never used smokeless tobacco. She reports that she does not drink alcohol or use illicit  drugs.  Allergies  Allergen Reactions  . Amiodarone Other (See Comments)    Contributed to hypoxia    Family History  Problem Relation Age of Onset  . Diabetes Mother   . Diabetes Father   . Heart disease Neg Hx     Prior to Admission medications   Medication Sig Start Date End Date Taking? Authorizing Provider  acetaminophen-codeine (TYLENOL #3) 300-30 MG tablet Take 1 tablet by mouth every 8 (eight) hours as needed (for pain). 01/25/16  Yes Yolanda Manges, DO  albuterol (PROVENTIL HFA;VENTOLIN HFA) 108 (90 Base) MCG/ACT inhaler Inhale 2 puffs into the lungs every 6 (six) hours as needed for wheezing or shortness of breath. 09/18/15  Yes Yolanda Manges, DO  BD ULTRA-FINE LANCETS lancets Use to check blood sugar one time daily 08/23/15  Yes Jana Half, MD    brimonidine (ALPHAGAN P) 0.1 % SOLN Place 1 drop into the left eye every 12 (twelve) hours. 07/11/15  Yes Darrick Huntsman, MD  dorzolamide (TRUSOPT) 2 % ophthalmic solution Place 1 drop into the left eye 2 (two) times daily. 07/11/15  Yes Darrick Huntsman, MD  DULERA 100-5 MCG/ACT AERO INHALE 2 PUFFS INTO THE LUNGS TWICE DAILY 09/07/15  Yes Yolanda Manges, DO  ferrous sulfate 325 (65 FE) MG tablet TAKE 1 TABLET(325 MG) BY MOUTH DAILY 07/11/15  Yes Darrick Huntsman, MD  furosemide (LASIX) 80 MG tablet Take 80mg  in the AM and 40mg  in the PM 01/22/16  Yes Laurey Morale, MD  glucose blood test strip Use as instructed 07/11/15  Yes Darrick Huntsman, MD  lisinopril (PRINIVIL,ZESTRIL) 20 MG tablet Take 1 tablet (20 mg total) by mouth daily. 09/14/15  Yes Alex Elson Clan, DO  metFORMIN (GLUCOPHAGE) 1000 MG tablet TAKE 1 TABLET(1000 MG) BY MOUTH TWICE DAILY WITH A MEAL 09/20/15  Yes Yolanda Manges, DO  metoprolol succinate (TOPROL-XL) 100 MG 24 hr tablet Take 1 tablet twice daily. 01/22/16  Yes Laurey Morale, MD  potassium chloride SA (K-DUR,KLOR-CON) 20 MEQ tablet Take 2 tablets (40 mEq total) by mouth daily. Only take this medication on days you take Lasix. 11/17/14  Yes Alex Elson Clan, DO  ranitidine (ZANTAC) 75 MG tablet Take 75 mg by mouth daily as needed for heartburn.   Yes Historical Provider, MD  rivaroxaban (XARELTO) 20 MG TABS tablet Take 20 mg by mouth daily with supper.   Yes Historical Provider, MD  spironolactone (ALDACTONE) 25 MG tablet TAKE 1 TABLET BY MOUTH EVERY DAY 05/31/15  Yes Yolanda Manges, DO    Physical Exam: Filed Vitals:   01/26/16 2200 01/26/16 2215 01/26/16 2245 01/27/16 0115  BP: 111/49 130/95 122/106 126/78  Pulse:   99   Temp:      TempSrc:      Resp: 12 19 21    SpO2:   79%       Constitutional: NAD, calm, comfortable Filed Vitals:   01/26/16 2200 01/26/16 2215 01/26/16 2245 01/27/16 0115  BP: 111/49 130/95 122/106 126/78  Pulse:   99   Temp:      TempSrc:       Resp: 12 19 21    SpO2:   79%    Eyes: PERRL, lids and conjunctivae normal ENMT: Mucous membranes are moist. Posterior pharynx clear of any exudate or lesions.Normal dentition.  Neck: normal, supple, no masses, no thyromegaly Respiratory: clear to auscultation bilaterally, no wheezing, no crackles. Normal respiratory effort. No accessory muscle use.  Cardiovascular:  Regular rate and rhythm, no murmurs / rubs / gallops. No extremity edema. 2+ pedal pulses. No carotid bruits.  Abdomen: no tenderness, no masses palpated. No hepatosplenomegaly. Bowel sounds positive.  Musculoskeletal: no clubbing / cyanosis. No joint deformity upper and lower extremities. Good ROM, no contractures. Normal muscle tone.  Skin: right foot has laceration in heel posteriorly. Left foot also with diabetic foot ulcer at base of first digit and signs of infection of entire first digit. Foot is indurated medially and is tender to touch. Neurologic: CN 2-12 grossly intact. Sensation intact, DTR normal. Strength 5/5 in all 4.  Psychiatric: Normal judgment and insight. Alert and oriented x 2. ooriented to time.Normal mood.   Labs on Admission: I have personally reviewed following labs and imaging studies  CBC:  Recent Labs Lab 01/22/16 1615 01/26/16 1458 01/26/16 1609  WBC 4.0  --  6.7  NEUTROABS  --   --  4.9  HGB 12.1 14.6 13.6  HCT 38.1 43.0 42.3  MCV 92.5  --  94.2  PLT 294  --  363   Basic Metabolic Panel:  Recent Labs Lab 01/22/16 1615 01/26/16 1458 01/26/16 1609  NA 136 138 137  K 3.7 4.1 4.2  CL 101 95* 97*  CO2 25  --  30  GLUCOSE 150* 174* 160*  BUN 15 20 17   CREATININE 1.17* 1.10* 1.22*  CALCIUM 8.4*  --  8.3*   GFR: Estimated Creatinine Clearance: 31 mL/min (by C-G formula based on Cr of 1.22). Liver Function Tests:  Recent Labs Lab 01/26/16 1609  AST 17  ALT 7*  ALKPHOS 122  BILITOT 1.4*  PROT 8.1  ALBUMIN 3.6   No results for input(s): LIPASE, AMYLASE in the last 168  hours. No results for input(s): AMMONIA in the last 168 hours. Coagulation Profile: No results for input(s): INR, PROTIME in the last 168 hours. Cardiac Enzymes: No results for input(s): CKTOTAL, CKMB, CKMBINDEX, TROPONINI in the last 168 hours. BNP (last 3 results) No results for input(s): PROBNP in the last 8760 hours. HbA1C: No results for input(s): HGBA1C in the last 72 hours. CBG: No results for input(s): GLUCAP in the last 168 hours. Lipid Profile: No results for input(s): CHOL, HDL, LDLCALC, TRIG, CHOLHDL, LDLDIRECT in the last 72 hours. Thyroid Function Tests: No results for input(s): TSH, T4TOTAL, FREET4, T3FREE, THYROIDAB in the last 72 hours. Anemia Panel: No results for input(s): VITAMINB12, FOLATE, FERRITIN, TIBC, IRON, RETICCTPCT in the last 72 hours. Urine analysis:    Component Value Date/Time   COLORURINE YELLOW 01/27/2016 0115   APPEARANCEUR CLEAR 01/27/2016 0115   LABSPEC 1.010 01/27/2016 0115   PHURINE 5.5 01/27/2016 0115   GLUCOSEU NEGATIVE 01/27/2016 0115   HGBUR TRACE* 01/27/2016 0115   HGBUR trace-lysed 07/17/2008 0902   BILIRUBINUR NEGATIVE 01/27/2016 0115   KETONESUR NEGATIVE 01/27/2016 0115   PROTEINUR 30* 01/27/2016 0115   UROBILINOGEN 1.0 02/17/2014 1259   NITRITE NEGATIVE 01/27/2016 0115   LEUKOCYTESUR NEGATIVE 01/27/2016 0115   Sepsis Labs: !!!!!!!!!!!!!!!!!!!!!!!!!!!!!!!!!!!!!!!!!!!! @LABRCNTIP (procalcitonin:4,lacticidven:4) )No results found for this or any previous visit (from the past 240 hour(s)).   Radiological Exams on Admission: Dg Chest 2 View  01/26/2016  CLINICAL DATA:  Sepsis. EXAM: CHEST  2 VIEW COMPARISON:  Chest radiographs dated 11/24/2014 and chest CT dated 05/03/2015. FINDINGS: Stable enlarged cardiac silhouette and left basilar scarring. The trachea remains deviated to the right at the thoracic inlet. Diffuse osteopenia. IMPRESSION: 1. No acute abnormality. 2. Stable cardiomegaly and left basilar scarring. 3. Stable  tracheal  deviation to the right at the level of the thoracic inlet due to a previously demonstrated large thyroid nodule. Electronically Signed   By: Beckie Salts M.D.   On: 01/26/2016 19:39   Dg Foot Complete Right  01/26/2016  CLINICAL DATA:  Right foot wounds, diabetes EXAM: RIGHT FOOT COMPLETE - 3+ VIEW COMPARISON:  12/09/2013 FINDINGS: Three views of the right foot submitted. There is diffuse osteopenia. Mild soft tissue swelling adjacent to distal aspect of first metatarsal. No acute fracture or subluxation. Atherosclerotic vascular calcifications are noted. No bone destruction to suggest osteomyelitis. There is plantar spur of calcaneus. Dorsal spurring anterior aspect of the talus. IMPRESSION: No acute fracture or subluxation. Limited study by diffuse osteopenia. Mild soft tissue swelling adjacent to distal aspect of first metatarsal. No evidence of bone destruction to suggest osteomyelitis. Plantar spur of calcaneus. Degenerative changes with dorsal spurring anterior aspect of the talus. Electronically Signed   By: Natasha Mead M.D.   On: 01/26/2016 17:00    EKG: Independently reviewed. Afib. No STEMI.  Assessment/Plan Principal Problem:   Diabetic foot infection (HCC) Active Problems:   Diabetes (HCC)   HYPERCHOLESTEROLEMIA   Glaucoma   Essential hypertension   GERD (gastroesophageal reflux disease)   Chronic combined systolic and diastolic congestive heart failure (HCC)   Chronic atrial fibrillation (HCC)   Asthma in adult   Osteomyelitis (HCC)   Sepsis (HCC)  sepsis/diabetic foot infection Zosyn given in the emergency room Pharmacy consult for vancomycin and Zosyn Lactic acid elevated on patient's arrival to ED, now within normal range Continuous pulse Up with assistance Blood cultures 2 drawn Urine culture in process, urinary micro-dirty catch Zofran when necessary nausea Dilaudid when necessary moderate to severe foot pain Tylenol when necessary fever and mild pain Wound care  consult  Congestive heart failure Lasix 80 mg every morning and 40 mg every pm mmetoprolol 100 mg 24 hour tablets  Twice a day Lisinopril 20 mg every morning Spironolactone 25 mg every morning K-Dur 40 mEq daily  Reflux Zantac switch to Pepcid daily  Atrial fibrillation Xarelto 20 mg  With supper  Diabetes Holding metformin Findings healing insulin  Asthma  continue albuterol inhaler when necessary  Shortness of breath Dulera  2 puffs twice a day  Glaucoma Alphagan1 drop in left eye every 12 hours, .1%->.15% trusopt 2% 1 drop in left eye twice a day  Anemia Iron tablet daily  DVT prophylaxis: xarelto Code Status: full Family Communication: dgtr at bedside  Disposition Plan: tbd  Consults called: none  Admission status: inpt tele    Haydee Salter MD [facility] Pager [phone]  If 7PM-7AM, please contact night-coverage [email or web address] Password [facility]  01/27/2016, 3:37 AM

## 2016-01-27 NOTE — Progress Notes (Signed)
CBG: 20  Treatment: D50 IV 50 mL  Symptoms: sweaty and difficult to arouse  Follow-up CBG: Time:2115 CBG Result:141  Possible Reasons for Event: Unknown  Comments/MD notified: Wyn Forster, NP    Dondra Spry

## 2016-01-28 ENCOUNTER — Inpatient Hospital Stay (HOSPITAL_COMMUNITY): Payer: Medicare Other

## 2016-01-28 ENCOUNTER — Ambulatory Visit: Payer: Medicare Other | Admitting: Internal Medicine

## 2016-01-28 ENCOUNTER — Other Ambulatory Visit (HOSPITAL_COMMUNITY): Payer: Medicare Other

## 2016-01-28 DIAGNOSIS — R7881 Bacteremia: Secondary | ICD-10-CM

## 2016-01-28 DIAGNOSIS — M79609 Pain in unspecified limb: Secondary | ICD-10-CM

## 2016-01-28 DIAGNOSIS — E1169 Type 2 diabetes mellitus with other specified complication: Secondary | ICD-10-CM

## 2016-01-28 DIAGNOSIS — B9561 Methicillin susceptible Staphylococcus aureus infection as the cause of diseases classified elsewhere: Secondary | ICD-10-CM

## 2016-01-28 DIAGNOSIS — L089 Local infection of the skin and subcutaneous tissue, unspecified: Secondary | ICD-10-CM

## 2016-01-28 DIAGNOSIS — I70234 Atherosclerosis of native arteries of right leg with ulceration of heel and midfoot: Secondary | ICD-10-CM

## 2016-01-28 LAB — CBC
HEMATOCRIT: 40.2 % (ref 36.0–46.0)
HEMOGLOBIN: 12.5 g/dL (ref 12.0–15.0)
MCH: 29.8 pg (ref 26.0–34.0)
MCHC: 31.1 g/dL (ref 30.0–36.0)
MCV: 95.9 fL (ref 78.0–100.0)
Platelets: 287 10*3/uL (ref 150–400)
RBC: 4.19 MIL/uL (ref 3.87–5.11)
RDW: 14.7 % (ref 11.5–15.5)
WBC: 9.6 10*3/uL (ref 4.0–10.5)

## 2016-01-28 LAB — GLUCOSE, CAPILLARY
GLUCOSE-CAPILLARY: 107 mg/dL — AB (ref 65–99)
GLUCOSE-CAPILLARY: 129 mg/dL — AB (ref 65–99)
GLUCOSE-CAPILLARY: 196 mg/dL — AB (ref 65–99)
GLUCOSE-CAPILLARY: 139 mg/dL — AB (ref 65–99)
GLUCOSE-CAPILLARY: 179 mg/dL — AB (ref 65–99)
Glucose-Capillary: 79 mg/dL (ref 65–99)
Glucose-Capillary: 94 mg/dL (ref 65–99)

## 2016-01-28 LAB — BASIC METABOLIC PANEL
Anion gap: 10 (ref 5–15)
BUN: 13 mg/dL (ref 6–20)
CHLORIDE: 96 mmol/L — AB (ref 101–111)
CO2: 30 mmol/L (ref 22–32)
Calcium: 7.5 mg/dL — ABNORMAL LOW (ref 8.9–10.3)
Creatinine, Ser: 0.95 mg/dL (ref 0.44–1.00)
GFR calc Af Amer: >60 mL/min (ref >60)
GFR, EST NON AFRICAN AMERICAN: 52 mL/min — AB (ref >60)
Glucose, Bld: 123 mg/dL — ABNORMAL HIGH (ref 65–99)
POTASSIUM: 3.5 mmol/L (ref 3.5–5.1)
Sodium: 136 mmol/L (ref 135–145)

## 2016-01-28 LAB — HEMOGLOBIN A1C
HEMOGLOBIN A1C: 7.9 % — AB (ref 4.8–5.6)
Hgb A1c MFr Bld: 8.5 % — ABNORMAL HIGH (ref 4.8–5.6)
MEAN PLASMA GLUCOSE: 197 mg/dL
MEAN PLASMA GLUCOSE: 180 mg/dL

## 2016-01-28 LAB — URINE CULTURE

## 2016-01-28 MED ORDER — HEPARIN (PORCINE) IN NACL 100-0.45 UNIT/ML-% IJ SOLN
1200.0000 [IU]/h | INTRAMUSCULAR | Status: DC
  Administered 2016-01-28 (×2): 800 [IU]/h via INTRAVENOUS
  Administered 2016-01-29: 1200 [IU]/h via INTRAVENOUS
  Filled 2016-01-28 (×3): qty 250

## 2016-01-28 MED ORDER — ADULT MULTIVITAMIN W/MINERALS CH
1.0000 | ORAL_TABLET | Freq: Every day | ORAL | Status: DC
  Administered 2016-01-28 – 2016-02-03 (×6): 1 via ORAL
  Filled 2016-01-28 (×6): qty 1

## 2016-01-28 MED ORDER — METOPROLOL SUCCINATE ER 25 MG PO TB24: 25.0000 mg | ORAL_TABLET | Freq: Every day | ORAL | Status: DC

## 2016-01-28 NOTE — Progress Notes (Signed)
Inpatient Diabetes Program Recommendations  AACE/ADA: New Consensus Statement on Inpatient Glycemic Control (2015)  Target Ranges:  Prepandial:   less than 140 mg/dL      Peak postprandial:   less than 180 mg/dL (1-2 hours)      Critically ill patients:  140 - 180 mg/dL   Results for [name], [name] (MRN [id number]) as of 01/28/2016 09:01  Ref. Range 01/27/2016 11:34 01/27/2016 17:37 01/27/2016 17:58 01/27/2016 20:45 01/27/2016 21:13 01/28/2016 07:45  Glucose-Capillary Latest Ref Range: 65-99 mg/dL 416 (H) 40 (LL) 384 (H) 20 (LL) 141 (H) 129 (H)   Review of Glycemic Control  Diabetes history: DM 2 Outpatient Diabetes medications: Metformin 1,000 mg BID Current orders for Inpatient glycemic control: Novolog Moderate TID  Inpatient Diabetes Program Recommendations: Correction (SSI): Patient seems to be sensitive to Novolog insulin having hypoglycemia x2 yesterday after 5 units for glucose in 200's. Please reduce correction scale to Novolog Sensitive While inpatient.  Thanks,  Christena Deem RN, MSN, [facility] Inpatient Diabetes Coordinator Team Pager [phone] (8a-5p)

## 2016-01-28 NOTE — Progress Notes (Signed)
ANTICOAGULATION CONSULT NOTE - Initial Consult  Pharmacy Consult for heparin  Indication: atrial fibrillation  Allergies  Allergen Reactions  . Amiodarone Other (See Comments)    Contributed to hypoxia    Patient Measurements: Wt= 67.8kg  Vital Signs: Temp: 98.4 F (36.9 C) (06/12 0746) Temp Source: Oral (06/12 0746) BP: 101/56 mmHg (06/12 0746) Pulse Rate: 90 (06/12 0746)  Labs:  Recent Labs  01/26/16 1609 01/27/16 0452 01/28/16 0416  HGB 13.6 12.5 12.5  HCT 42.3 40.5 40.2  PLT 363 303 287  CREATININE 1.22* 1.16* 0.95    Estimated Creatinine Clearance: 39.8 mL/min (by C-G formula based on Cr of 0.95).   Medical History: Past Medical History  Diagnosis Date  . Hypertension   . Asthma   . Osteoarthritis     left shoulder  . Pseudogout   . Hypokalemia     a. Previously felt due to diuretics, req supplementation.  . Churg-Strauss syndrome (HCC)     a. Sural nerve biopsy 02/2000.  Darene Lamer disease     a. s/p thyroidectomy.  . Glaucoma   . Hypercholesteremia   . Venous insufficiency   . Osteopenia     DEXA 2004  . Lipoma     left inner thigh  . Cataract     left eye  . VASCULITIS   . Chronic combined systolic and diastolic heart failure (HCC)     a. 02/2012 EF:20-25%  . Pleural effusion, left     a. Thoracentesis 2001 - per notes, no malignant cells, was transudative.  Marland Kitchen NSVT (nonsustained ventricular tachycardia) (HCC)     a. During CHF adm 2001.  . Multifocal atrial tachycardia (HCC)     a. Documented as OP 01/2012.  Marland Kitchen Ectopic atrial tachycardia (HCC)     a. Documented on tele as IP 02/2012.  . Pulmonary HTN (HCC)     a. Mod by echo 02/2012.  . Valvular heart disease     a. Echo 02/2012: mod MR/TR, mild AI.  Marland Kitchen Pericardial effusion     a. Echo 02/2012: small-mod pericardial effusion.  . Atrial fibrillation, permanent (HCC)     a. Dx 04/2013, on metoprolol, dig and xarelto  . Shortness of breath   . CHF (congestive heart failure) (HCC)   . On home  oxygen therapy     "wear it mostly at night" (09/20/2014)  . Pneumonia 1930's  . Type II diabetes mellitus (HCC)     Medications:  Prescriptions prior to admission  Medication Sig Dispense Refill Last Dose  . acetaminophen-codeine (TYLENOL #3) 300-30 MG tablet Take 1 tablet by mouth every 8 (eight) hours as needed (for pain). 21 tablet 0 Past Week at Unknown time  . albuterol (PROVENTIL HFA;VENTOLIN HFA) 108 (90 Base) MCG/ACT inhaler Inhale 2 puffs into the lungs every 6 (six) hours as needed for wheezing or shortness of breath. 1 Inhaler 2 unk  . BD ULTRA-FINE LANCETS lancets Use to check blood sugar one time daily 100 each 5 01/26/2016 at Unknown time  . brimonidine (ALPHAGAN P) 0.1 % SOLN Place 1 drop into the left eye every 12 (twelve) hours. 45 mL 0 01/26/2016 at Unknown time  . dorzolamide (TRUSOPT) 2 % ophthalmic solution Place 1 drop into the left eye 2 (two) times daily. 10 mL 3 01/26/2016 at Unknown time  . DULERA 100-5 MCG/ACT AERO INHALE 2 PUFFS INTO THE LUNGS TWICE DAILY 13 g 3 01/26/2016 at Unknown time  . ferrous sulfate 325 (65 FE) MG tablet TAKE  1 TABLET(325 MG) BY MOUTH DAILY 30 tablet 3 01/26/2016 at Unknown time  . furosemide (LASIX) 80 MG tablet Take  in the AM and  in the PM 45 tablet 3 01/26/2016 at Unknown time  . glucose blood test strip Use as instructed 100 each 12 01/26/2016 at Unknown time  . lisinopril (PRINIVIL,ZESTRIL) 20 MG tablet Take 1 tablet (20 mg total) by mouth daily. 30 tablet 3 01/26/2016 at Unknown time  . metFORMIN (GLUCOPHAGE) 1000 MG tablet TAKE 1 TABLET(1000 MG) BY MOUTH TWICE DAILY WITH A MEAL 180 tablet 1 01/26/2016 at am  . metoprolol succinate (TOPROL-XL) 100 MG 24 hr tablet Take 1 tablet twice daily. 60 tablet 3 01/26/2016 at 0700  . potassium chloride SA (K-DUR,KLOR-CON) 20 MEQ tablet Take 2 tablets (40 mEq total) by mouth daily. Only take this medication on days you take Lasix. 60 tablet 0 01/26/2016 at Unknown time  . ranitidine (ZANTAC) 75 MG  tablet Take 75 mg by mouth daily as needed for heartburn.   unk  . rivaroxaban (XARELTO) 20 MG TABS tablet Take 20 mg by mouth daily with supper.   01/25/2016 at Unknown time  . spironolactone (ALDACTONE) 25 MG tablet TAKE 1 TABLET BY MOUTH EVERY DAY 90 tablet 1 01/26/2016 at Unknown time   Scheduled:  . brimonidine  1 drop Left Eye Q12H  .  ceFAZolin (ANCEF) IV  1 g Intravenous Q8H  . dorzolamide  1 drop Left Eye BID  . famotidine  20 mg Oral Daily  . ferrous sulfate  325 mg Oral Q breakfast  . furosemide  40 mg Oral QPM  . furosemide  80 mg Oral Daily  . insulin aspart  0-15 Units Subcutaneous TID WC  . [START ON 01/29/2016] metoprolol succinate  25 mg Oral Daily  . mometasone-formoterol  2 puff Inhalation BID  . multivitamin with minerals  1 tablet Oral Daily  . potassium chloride SA  40 mEq Oral Daily  . spironolactone  25 mg Oral Daily    Assessment: 82 yo female with afib on Xarelto PTA. She is noted for angiography to assess right leg perfusion on Wednesday and pharmacy has been consulted to dose heparin. Last Xarelto dose was 6/11 at 6pm.  Goal of Therapy:  Heparin level 0.3-0.7 units/ml aPTT 66-102 seconds Monitor platelets by anticoagulation protocol: Yes   Plan:  -No heparin bolus -Will begin heparin at 6pm at 800 units/hr -aptt and heparin level in 8 hours and daily wth CBC daily  Harland German, Pharm D 01/28/2016 4:37 PM

## 2016-01-28 NOTE — Progress Notes (Signed)
Spot recheck of pt's CBG = 79. Assisted pt to drink 1 half of Chocolate Ensure. Pt tolerated well. Dondra Spry, RN

## 2016-01-28 NOTE — Progress Notes (Signed)
[facility]   Reason for visit: Follow up on MSSA bacteremia  Interval History: seen by vascular surgery and to get angiogram Wed.  MRI with possible osteomyelitis of first   Physical Exam: Constitutional:  Filed Vitals:   01/28/16 0601 01/28/16 0746  BP: 95/54 101/56  Pulse: 73 90  Temp: 98.2 F (36.8 C) 98.4 F (36.9 C)  Resp: 18 18   patient appears in NAD Respiratory: Normal respiratory effort; CTA B Cardiovascular: RRR  Review of Systems: Constitutional: negative for fevers Gastrointestinal: negative for diarrhea  Lab Results  Component Value Date   WBC 9.6 01/28/2016   HGB 12.5 01/28/2016   HCT 40.2 01/28/2016   MCV 95.9 01/28/2016   PLT 287 01/28/2016    Lab Results  Component Value Date   CREATININE 0.95 01/28/2016   BUN 13 01/28/2016   NA 136 01/28/2016   K 3.5 01/28/2016   CL 96* 01/28/2016   CO2 30 01/28/2016    Lab Results  Component Value Date   ALT 7* 01/26/2016   AST 17 01/26/2016   ALKPHOS 122 01/26/2016     Microbiology: Recent Results (from the past 240 hour(s))  Blood Culture (routine x 2)     Status: None (Preliminary result)   Collection Time: 01/26/16  5:58 PM  Result Value Ref Range Status   Specimen Description BLOOD WRIST LEFT  Final   Special Requests BOTTLES DRAWN AEROBIC ONLY 6CC  Final   Culture  Setup Time   Final    GRAM POSITIVE COCCI IN CLUSTERS AEROBIC BOTTLE ONLY CRITICAL RESULT CALLED TO, READ BACK BY AND VERIFIED WITH: R RUMBARGER,PHARMD AT 0957 01/27/16    Culture NO GROWTH < 24 HOURS  Final   Report Status PENDING  Incomplete  Blood Culture ID Panel (Reflexed)     Status: Abnormal   Collection Time: 01/26/16  5:58 PM  Result Value Ref Range Status   Enterococcus species NOT DETECTED NOT DETECTED Final   Vancomycin resistance NOT DETECTED NOT DETECTED Final   Listeria monocytogenes NOT DETECTED NOT DETECTED Final   Staphylococcus species DETECTED (A) NOT DETECTED Final    Comment:  CRITICAL RESULT CALLED TO, READ BACK BY AND VERIFIED WITH: R RUMBARGER,PHARMD AT 0957 01/27/16    Staphylococcus aureus DETECTED (A) NOT DETECTED Final    Comment: CRITICAL RESULT CALLED TO, READ BACK BY AND VERIFIED WITH: R RUMBARGER,PHARMD AT 0957 01/27/16    Methicillin resistance NOT DETECTED NOT DETECTED Final   Streptococcus species NOT DETECTED NOT DETECTED Final   Streptococcus agalactiae NOT DETECTED NOT DETECTED Final   Streptococcus pneumoniae NOT DETECTED NOT DETECTED Final   Streptococcus pyogenes NOT DETECTED NOT DETECTED Final   Acinetobacter baumannii NOT DETECTED NOT DETECTED Final   Enterobacteriaceae species NOT DETECTED NOT DETECTED Final   Enterobacter cloacae complex NOT DETECTED NOT DETECTED Final   Escherichia coli NOT DETECTED NOT DETECTED Final   Klebsiella oxytoca NOT DETECTED NOT DETECTED Final   Klebsiella pneumoniae NOT DETECTED NOT DETECTED Final   Proteus species NOT DETECTED NOT DETECTED Final   Serratia marcescens NOT DETECTED NOT DETECTED Final   Carbapenem resistance NOT DETECTED NOT DETECTED Final   Haemophilus influenzae NOT DETECTED NOT DETECTED Final   Neisseria meningitidis NOT DETECTED NOT DETECTED Final   Pseudomonas aeruginosa NOT DETECTED NOT DETECTED Final   Candida albicans NOT DETECTED NOT DETECTED Final   Candida glabrata NOT DETECTED NOT DETECTED Final   Candida krusei NOT DETECTED NOT DETECTED Final  Candida parapsilosis NOT DETECTED NOT DETECTED Final   Candida tropicalis NOT DETECTED NOT DETECTED Final  Blood Culture (routine x 2)     Status: None (Preliminary result)   Collection Time: 01/26/16  6:01 PM  Result Value Ref Range Status   Specimen Description BLOOD RIGHT HAND  Final   Special Requests BOTTLES DRAWN AEROBIC AND ANAEROBIC 5CC  Final   Culture  Setup Time   Final    GRAM POSITIVE COCCI IN CLUSTERS IN BOTH AEROBIC AND ANAEROBIC BOTTLES CRITICAL RESULT CALLED TO, READ BACK BY AND VERIFIED WITH: R RUMBARGER,PHARMD AT  0957 01/27/16    Culture NO GROWTH < 24 HOURS  Final   Report Status PENDING  Incomplete    Impression/Plan:  1. MSSA bacteremia - possibly from foot infection, on cefazolin.  Will order TTE.  2.  Wound infection - angiogram Wed, ABIs.

## 2016-01-28 NOTE — Telephone Encounter (Signed)
Pt admitted into hospital on 06/10- will address at HFU visit-Ernest Popowski Cassady6/12/201711:35 AM

## 2016-01-28 NOTE — Progress Notes (Signed)
PROGRESS NOTE  [name] HYQ:[id number] DOB: 10/07/1925 DOA: 01/26/2016 PCP: Evelena Peat, DO  Brief History:  82 year old female with a history of diabetes mellitus, atrial fibrillation, hypertension, Churg-Strauss syndrome, combined systolic and diastolic CHF presented with concerns of diabetic foot infection. The patient normally lives with her daughter who stated that one of the rubber shoes came off of the 4 point walker causing the exposed metal frame to come down on the patient's foot resulting in injury approximately 2 weeks ago. There was concern of increasing pain and the foot looking worse resulting in the patient coming to the hospital. Upon presentation, the patient was noted to have a low-grade temperature with tachycardia and lactic acid up to 3.06. The patient was initially started on vancomycin and Zosyn. Blood cultures ultimately grew MSSA and antibiotics have been narrowed to cefazolin.   Assessment/Plan: Sepsis  -present at time of admission -secondary to MSSA bacteremia -lactic acid improved with IVF -continue cefazolin -ID following  MSSA bacteremia -source is foot -ID following -echo -surveillance blood cultures neg so far  Peripheral vascular disease with ischemic foot changes -anterior foot with eschar without erythema or drainage -Appreciate vascular surgery consult-->angiogram on 01/30/16-->d/c xarelto -04/09/2015 bilateral ABIs falsely Elevated -04/09/2015 R toe TBI --0.46; L-toe--0.75 -01/28/16 R-ABI--0.76, L-ABI--noncompressible -decubitus heel ulcer--present at time of admission- -appreciate wound care recommendations -01/27/2016 MRI foot erosive lesion medial head first metatarsal with low-level edema and enhancement; diffuse soft tissue edema of the ankle and foot  diabetes mellitus type 2 with renal manifestations -09/14/2015 hemoglobin A1c 7.5 -Continue NovoLog sliding scale -01/27/2016 hemoglobin A1c 7.9  Chronic systolic  CHF/NICM -09/21/2014 echo EF 20-25%, severe diffuse HK, severe TR, severe RV dyfx -Continue furosemide home dose--80 am/40pm -Continue lisinopril and metoprolol succinate  Chronic Atrial Fibrillation -CHADS-VASc = 6 -rate controlled -continue xarelto--decrease dose to 15 mg daily due to CrCl < 30 -Continue metoprolol succinate  -since xarelto d/ced-->start heparin bridge until angiogram on 01/30/16   Disposition Plan: Home in 3-4 days  Family Communication: daughter updated at beside--total time 35 min; >50% spent counseling and coordinating care  Consultants: ID, Vascular surgery  Code Status: FULL  Subjective: Penicillin with is low but better today. Denies any fevers, chills, chest pain, short of breath, nausea, vomiting, diarrhea, abdominal pain.  Objective: Filed Vitals:   01/27/16 1800 01/27/16 2054 01/28/16 0601 01/28/16 0746  BP: 125/57 128/65 95/54 101/56  Pulse: 86 84 73 90  Temp: 98.3 F (36.8 C)  98.2 F (36.8 C) 98.4 F (36.9 C)  TempSrc: Oral   Oral  Resp: SpO2: 98% 100% 90% 97%    Intake/Output Summary (Last 24 hours) at 01/28/16 1613 Last data filed at 01/28/16 1230  Gross per 24 hour  Intake    820 ml  Output    150 ml  Net    670 ml   Weight change:  Exam:   General:  Pt is alert, follows commands appropriately, not in acute distress  HEENT: No icterus, No thrush, No neck mass, [place]/AT  Cardiovascular: IRRR, S1/S2, no rubs, no gallops  Respiratory: Bibasilar crackles. No wheezing.  Abdomen: Soft/+BS, non tender, non distended, no guarding  Extremities: No edema, No lymphangitis, No petechiae, No rashes, no synovitis; right forefoot with eschar lesions on the first metatarsal phalangeal joint area and fifth metatarsophalangeal joint area. No drainage, no erythema.   Data Reviewed: I have personally reviewed following labs and imaging studies Basic  Metabolic Panel:  Recent Labs Lab 01/22/16 1615 01/26/16 1458  01/26/16 1609 01/27/16 0452 01/28/16 0416  NA 136 138 137 138 136  K 3.7 4.1 4.2 3.7 3.5  CL 101 95* 97* 98* 96*  CO2 25  --  30 29 30   GLUCOSE 150* 174* 160* 124* 123*  BUN 15 20 17 16 13   CREATININE 1.17* 1.10* 1.22* 1.16* 0.95  CALCIUM 8.4*  --  8.3* 8.0* 7.5*   Liver Function Tests:  Recent Labs Lab 01/26/16 1609  AST 17  ALT 7*  ALKPHOS 122  BILITOT 1.4*  PROT 8.1  ALBUMIN 3.6   No results for input(s): LIPASE, AMYLASE in the last 168 hours. No results for input(s): AMMONIA in the last 168 hours. Coagulation Profile: No results for input(s): INR, PROTIME in the last 168 hours. CBC:  Recent Labs Lab 01/22/16 1615 01/26/16 1458 01/26/16 1609 01/27/16 0452 01/28/16 0416  WBC 4.0  --  6.7 8.3 9.6  NEUTROABS  --   --  4.9  --   --   HGB 12.1 14.6 13.6 12.5 12.5  HCT 38.1 43.0 42.3 40.5 40.2  MCV 92.5  --  94.2 96.4 95.9  PLT 294  --  363 303 287   Cardiac Enzymes: No results for input(s): CKTOTAL, CKMB, CKMBINDEX, TROPONINI in the last 168 hours. BNP: Invalid input(s): POCBNP CBG:  Recent Labs Lab 01/27/16 2113 01/28/16 0018 01/28/16 0209 01/28/16 0745 01/28/16 1237  GLUCAP 141* 79 107* 129* 196*   HbA1C:  Recent Labs  01/27/16 0452 01/27/16 1554  HGBA1C 8.5* 7.9*   Urine analysis:    Component Value Date/Time   COLORURINE YELLOW 01/27/2016 0115   APPEARANCEUR CLEAR 01/27/2016 0115   LABSPEC 1.010 01/27/2016 0115   PHURINE 5.5 01/27/2016 0115   GLUCOSEU NEGATIVE 01/27/2016 0115   HGBUR TRACE* 01/27/2016 0115   HGBUR trace-lysed 07/17/2008 0902   BILIRUBINUR NEGATIVE 01/27/2016 0115   KETONESUR NEGATIVE 01/27/2016 0115   PROTEINUR 30* 01/27/2016 0115   UROBILINOGEN 1.0 02/17/2014 1259   NITRITE NEGATIVE 01/27/2016 0115   LEUKOCYTESUR NEGATIVE 01/27/2016 0115   Sepsis Labs: @LABRCNTIP (procalcitonin:4,lacticidven:4) ) Recent Results (from the past 240 hour(s))  Blood Culture (routine x 2)     Status: Abnormal (Preliminary result)    Collection Time: 01/26/16  5:58 PM  Result Value Ref Range Status   Specimen Description BLOOD WRIST LEFT  Final   Special Requests BOTTLES DRAWN AEROBIC ONLY 6CC  Final   Culture  Setup Time   Final    GRAM POSITIVE COCCI IN CLUSTERS AEROBIC BOTTLE ONLY CRITICAL RESULT CALLED TO, READ BACK BY AND VERIFIED WITH: R RUMBARGER,PHARMD AT 0957 01/27/16    Culture (A)  Final    STAPHYLOCOCCUS AUREUS SUSCEPTIBILITIES TO FOLLOW    Report Status PENDING  Incomplete  Blood Culture ID Panel (Reflexed)     Status: Abnormal   Collection Time: 01/26/16  5:58 PM  Result Value Ref Range Status   Enterococcus species NOT DETECTED NOT DETECTED Final   Vancomycin resistance NOT DETECTED NOT DETECTED Final   Listeria monocytogenes NOT DETECTED NOT DETECTED Final   Staphylococcus species DETECTED (A) NOT DETECTED Final    Comment: CRITICAL RESULT CALLED TO, READ BACK BY AND VERIFIED WITH: R RUMBARGER,PHARMD AT 0957 01/27/16    Staphylococcus aureus DETECTED (A) NOT DETECTED Final    Comment: CRITICAL RESULT CALLED TO, READ BACK BY AND VERIFIED WITH: R RUMBARGER,PHARMD AT 0957 01/27/16    Methicillin resistance NOT DETECTED NOT DETECTED Final  Streptococcus species NOT DETECTED NOT DETECTED Final   Streptococcus agalactiae NOT DETECTED NOT DETECTED Final   Streptococcus pneumoniae NOT DETECTED NOT DETECTED Final   Streptococcus pyogenes NOT DETECTED NOT DETECTED Final   Acinetobacter baumannii NOT DETECTED NOT DETECTED Final   Enterobacteriaceae species NOT DETECTED NOT DETECTED Final   Enterobacter cloacae complex NOT DETECTED NOT DETECTED Final   Escherichia coli NOT DETECTED NOT DETECTED Final   Klebsiella oxytoca NOT DETECTED NOT DETECTED Final   Klebsiella pneumoniae NOT DETECTED NOT DETECTED Final   Proteus species NOT DETECTED NOT DETECTED Final   Serratia marcescens NOT DETECTED NOT DETECTED Final   Carbapenem resistance NOT DETECTED NOT DETECTED Final   Haemophilus influenzae NOT  DETECTED NOT DETECTED Final   Neisseria meningitidis NOT DETECTED NOT DETECTED Final   Pseudomonas aeruginosa NOT DETECTED NOT DETECTED Final   Candida albicans NOT DETECTED NOT DETECTED Final   Candida glabrata NOT DETECTED NOT DETECTED Final   Candida krusei NOT DETECTED NOT DETECTED Final   Candida parapsilosis NOT DETECTED NOT DETECTED Final   Candida tropicalis NOT DETECTED NOT DETECTED Final  Blood Culture (routine x 2)     Status: Abnormal (Preliminary result)   Collection Time: 01/26/16  6:01 PM  Result Value Ref Range Status   Specimen Description BLOOD RIGHT HAND  Final   Special Requests BOTTLES DRAWN AEROBIC AND ANAEROBIC 5CC  Final   Culture  Setup Time   Final    GRAM POSITIVE COCCI IN CLUSTERS IN BOTH AEROBIC AND ANAEROBIC BOTTLES CRITICAL RESULT CALLED TO, READ BACK BY AND VERIFIED WITH: R RUMBARGER,PHARMD AT 0957 01/27/16    Culture STAPHYLOCOCCUS AUREUS (A)  Final   Report Status PENDING  Incomplete  Urine culture     Status: Abnormal   Collection Time: 01/27/16  1:15 AM  Result Value Ref Range Status   Specimen Description URINE, RANDOM  Final   Special Requests NONE  Final   Culture MULTIPLE SPECIES PRESENT, SUGGEST RECOLLECTION (A)  Final   Report Status 01/28/2016 FINAL  Final  Blood Cultures x 2 sites     Status: None (Preliminary result)   Collection Time: 01/27/16  4:04 PM  Result Value Ref Range Status   Specimen Description BLOOD RIGHT ANTECUBITAL  Final   Special Requests BOTTLES DRAWN AEROBIC ONLY 5CC  Final   Culture NO GROWTH < 24 HOURS  Final   Report Status PENDING  Incomplete  Blood Cultures x 2 sites     Status: None (Preliminary result)   Collection Time: 01/27/16  4:15 PM  Result Value Ref Range Status   Specimen Description BLOOD RIGHT WRIST  Final   Special Requests IN PEDIATRIC BOTTLE 2CC  Final   Culture NO GROWTH < 24 HOURS  Final   Report Status PENDING  Incomplete     Scheduled Meds: . brimonidine  1 drop Left Eye Q12H  .   ceFAZolin (ANCEF) IV  1 g Intravenous Q8H  . dorzolamide  1 drop Left Eye BID  . famotidine  20 mg Oral Daily  . ferrous sulfate  325 mg Oral Q breakfast  . furosemide  40 mg Oral QPM  . furosemide  80 mg Oral Daily  . insulin aspart  0-15 Units Subcutaneous TID WC  . [START ON 01/29/2016] metoprolol succinate  25 mg Oral Daily  . mometasone-formoterol  2 puff Inhalation BID  . multivitamin with minerals  1 tablet Oral Daily  . potassium chloride SA  40 mEq Oral Daily  . spironolactone  25 mg Oral Daily   Continuous Infusions:   Procedures/Studies: Dg Chest 2 View  01/26/2016  CLINICAL DATA:  Sepsis. EXAM: CHEST  2 VIEW COMPARISON:  Chest radiographs dated 11/24/2014 and chest CT dated 05/03/2015. FINDINGS: Stable enlarged cardiac silhouette and left basilar scarring. The trachea remains deviated to the right at the thoracic inlet. Diffuse osteopenia. IMPRESSION: 1. No acute abnormality. 2. Stable cardiomegaly and left basilar scarring. 3. Stable tracheal deviation to the right at the level of the thoracic inlet due to a previously demonstrated large thyroid nodule. Electronically Signed   By: Beckie Salts M.D.   On: 01/26/2016 19:39   Mr Foot Right W Wo Contrast  01/27/2016  CLINICAL DATA:  Foot pain with draining wounds.  Heel pain. EXAM: MRI OF THE RIGHT FOREFOOT WITHOUT AND WITH CONTRAST TECHNIQUE: Multiplanar, multisequence MR imaging was performed both before and after administration of intravenous contrast. CONTRAST:  7mL MULTIHANCE GADOBENATE DIMEGLUMINE 529 MG/ML IV SOLN COMPARISON:  01/26/2016 FINDINGS: Osteomyelitis protocol MRI of the foot was obtained, to include the entire foot and ankle. This protocol uses a large field of view to cover the entire foot and ankle, and is suitable for assessing bony structures for osteomyelitis. Due to the large field of view and imaging plane choice, this protocol is less sensitive for assessing small structures such as ligamentous structures of the  foot and ankle, compared to a dedicated forefoot or dedicated hindfoot exam. Despite efforts by the technologist and patient, motion artifact is present on today's exam and could not be eliminated. This reduces exam sensitivity and specificity. There is diffuse subcutaneous edema in the ankle, heel, and foot. In the head of the first metatarsal there is an erosive lesion dorsum medially. Reportedly this is in the vicinity of a skin wound, although the wound is poorly seen on today's MRI. Differential diagnostic considerations include early osteomyelitis versus gout arthropathy. No other osseous edema or significant abnormal osseous enhancement observed to suggest osteomyelitis elsewhere. Incidental note is made of a degenerative subcortical cyst or geode distally in the middle cuneiform. No malalignment at the Lisfranc joint. Thickened plantar fascia raises possibility plantar fasciitis. No drainable abscess. No significant joint effusion to further suggest septic joint. IMPRESSION: 1. Erosive lesion along the dorsal -medial head of the first metatarsal with associated low-level edema and enhancement. This resembles a gout lesion or erosive arthropathy, but by report there is a draining wound in this vicinity which increases the likelihood that this could represent localized early osteomyelitis. 2. Diffuse soft tissue edema in the ankle and foot, nonspecific for bland edema versus cellulitis. 3. Thickened plantar fascia suggesting plantar fasciitis. 4. This final interpretation correlates with the preliminary report issued by Dr. Cherly Hensen. Electronically Signed   By: Gaylyn Rong M.D.   On: 01/27/2016 13:32   Dg Foot Complete Right  01/26/2016  CLINICAL DATA:  Right foot wounds, diabetes EXAM: RIGHT FOOT COMPLETE - 3+ VIEW COMPARISON:  12/09/2013 FINDINGS: Three views of the right foot submitted. There is diffuse osteopenia. Mild soft tissue swelling adjacent to distal aspect of first metatarsal. No acute  fracture or subluxation. Atherosclerotic vascular calcifications are noted. No bone destruction to suggest osteomyelitis. There is plantar spur of calcaneus. Dorsal spurring anterior aspect of the talus. IMPRESSION: No acute fracture or subluxation. Limited study by diffuse osteopenia. Mild soft tissue swelling adjacent to distal aspect of first metatarsal. No evidence of bone destruction to suggest osteomyelitis. Plantar spur of calcaneus. Degenerative changes with dorsal spurring anterior aspect of  the talus. Electronically Signed   By: Natasha Mead M.D.   On: 01/26/2016 17:00    Rubee Vega, DO  [facility] Pager [phone]  If 7PM-7AM, please contact night-coverage [email or web address] Password [email or web address] 01/28/2016, 4:13 PM   LOS: 1 day

## 2016-01-28 NOTE — Progress Notes (Signed)
VASCULAR LAB PRELIMINARY  ARTERIAL  ABI completed:    RIGHT    LEFT    PRESSURE WAVEFORM  PRESSURE WAVEFORM  BRACHIAL 100 Triphasic BRACHIAL 101 Triphasic  DP 56 Dampened Monophasic DP >254 Dampened Monophasic  PT 77 Monophasic PT 67  Dampened Monophasic    RIGHT LEFT  ABI 0.76 N/A Non compressible   Right : ABI indicates a mild reduction in arterial flow however Doppler waveforms am suggest a false elevation of pressures. Left : ABIs were not ascertained due to non compressible vessels. Doppler waveforms however may suggest a false elevation of pressures   Christan Ciccarelli, RVS 01/28/2016, 12:15 PM

## 2016-01-28 NOTE — Progress Notes (Signed)
Initial Nutrition Assessment  DOCUMENTATION CODES:   Not applicable  INTERVENTION:   -MVI daily  NUTRITION DIAGNOSIS:   Increased nutrient needs related to wound healing as evidenced by estimated needs.  GOAL:   Patient will meet greater than or equal to 90% of their needs  MONITOR:   PO intake, Supplement acceptance, Labs, Weight trends, Skin, I & O's  REASON FOR ASSESSMENT:   Consult Wound healing  ASSESSMENT:   [name] is a 82 y.o. female with medical history significant of diabetes, atrial fibrillation presented to the emergency room with complaint of diabetic foot infection.  Pt admitted with rt DM foot infection.   Hx obtained from pt and pt daughter at bedside. Pt consuming lunch at time of visit. Both confirm that appetite is generally good. Pt daughter reveals that pt generally consumes 3-4 meals per day (3 meals and 1 snack). Pt daughter reports pt consumed 100% of breakfast meal this morning ("I fed her and made her eat it all").   Pt reports UBW is around 145#. Per pt, weight ranges between 140-150# and weight generally fluctuates between this due to diuretic therapy. Pt and pt daughter deny any recent weight loss.   Pt daughter shares that pt with wounds on rt foot over the past 6 weeks, which developed after pt hit her foot with her walker. Pt has experienced a general decline in health over this time periods, as she has been unable to be very mobile due to pain in her foot. Per pt daughter, PCP was managing wounds and pt daughter was managing dressing. Per [facility] note, pt with unstageable wounds to rt heel, rt inner anterior foot, and rt outer foot. Pt was not on any supplements or vitamins PTA. Pt and daughter politely declined nutrition supplements ("she eats good; she doesn't need them"), but are interested in MVI. RD to order.   Both pt and daughter report good DM control at home. Per DM coordinator note, home medication regimen Metformin 1000 mg BID.  Last Hgb A1c 7.9 (01/27/16), which reveals good control given pt's advanced age. Per pt daughter, home CBGS range in the 120's.   Nutrition-Focused physical exam completed. Findings are mild (orbital region only) fat depletion, mild muscle depletion, and no edema.   Discussed importance of good nutritional intake and glycemic control to support wound healing. Pt daughter ensures that she will be present to assist with feeding pt and encourage PO intake.   Labs reviewed: CBGS: 107-129.   Diet Order:  Diet heart healthy/carb modified Room service appropriate?: Yes; Fluid consistency:: Thin  Skin:  Wound (see comment) (UN rt heel, rt outer foot, rt inner interior foot)  Last BM:  01/28/16  Height:   Ht Readings from Last 1 Encounters:  08/23/15 5\' 7"  (1.702 m)    Weight:   Wt Readings from Last 1 Encounters:  01/22/16 149 lb 8 oz (67.813 kg)    Ideal Body Weight:  61.4 kg  BMI:  Estimated body mass index is 23.41 kg/(m^2) as calculated from the following:   Height as of 08/23/15: 5\' 7"  (1.702 m).   Weight as of 01/22/16: 149 lb 8 oz (67.813 kg).  Estimated Nutritional Needs:   Kcal:  1500-1700  Protein:  80-95 grams  Fluid:  1.5-1.7 L  EDUCATION NEEDS:   Education needs addressed  Stephone Gum A. Mayford Knife, RD, LDN, CDE Pager: [phone] After hours Pager: [phone]

## 2016-01-29 ENCOUNTER — Inpatient Hospital Stay (HOSPITAL_COMMUNITY): Payer: Medicare Other

## 2016-01-29 DIAGNOSIS — R7881 Bacteremia: Secondary | ICD-10-CM

## 2016-01-29 DIAGNOSIS — L8990 Pressure ulcer of unspecified site, unspecified stage: Secondary | ICD-10-CM | POA: Diagnosis present

## 2016-01-29 LAB — CULTURE, BLOOD (ROUTINE X 2)

## 2016-01-29 LAB — ECHOCARDIOGRAM COMPLETE
AVPHT: 415 ms
FS: 13 % — AB (ref 28–44)
IVS/LV PW RATIO, ED: 0.77
LA diam index: 2.35 cm/m2
LA vol index: 65.9 mL/m2
LASIZE: 42 mm
LAVOL: 118 mL
LAVOLA4C: 104 mL
LEFT ATRIUM END SYS DIAM: 42 mm
LVOT area: 3.46 cm2
LVOT diameter: 21 mm
MRPISAEROA: 0.13 cm2
MV VTI: 129 cm
PW: 15 mm — AB (ref 0.6–1.1)
Reg peak vel: 335 cm/s
TR max vel: 335 cm/s

## 2016-01-29 LAB — CBC
HEMATOCRIT: 35.4 % — AB (ref 36.0–46.0)
HEMOGLOBIN: 11.3 g/dL — AB (ref 12.0–15.0)
MCH: 29.6 pg (ref 26.0–34.0)
MCHC: 31.9 g/dL (ref 30.0–36.0)
MCV: 92.7 fL (ref 78.0–100.0)
Platelets: 323 10*3/uL (ref 150–400)
RBC: 3.82 MIL/uL — AB (ref 3.87–5.11)
RDW: 14.6 % (ref 11.5–15.5)
WBC: 7.6 10*3/uL (ref 4.0–10.5)

## 2016-01-29 LAB — HEPARIN LEVEL (UNFRACTIONATED)
Heparin Unfractionated: 1.2 IU/mL — ABNORMAL HIGH (ref 0.30–0.70)
Heparin Unfractionated: 0.79 IU/mL — ABNORMAL HIGH (ref 0.30–0.70)

## 2016-01-29 LAB — APTT
APTT: 47 s — AB (ref 24–37)
aPTT: 43 seconds — ABNORMAL HIGH (ref 24–37)

## 2016-01-29 LAB — GLUCOSE, CAPILLARY
GLUCOSE-CAPILLARY: 72 mg/dL (ref 65–99)
Glucose-Capillary: 162 mg/dL — ABNORMAL HIGH (ref 65–99)
Glucose-Capillary: 283 mg/dL — ABNORMAL HIGH (ref 65–99)

## 2016-01-29 LAB — MAGNESIUM: Magnesium: 1.7 mg/dL (ref 1.7–2.4)

## 2016-01-29 MED ORDER — METOPROLOL SUCCINATE ER 50 MG PO TB24
50.0000 mg | ORAL_TABLET | Freq: Every day | ORAL | Status: DC
Start: 2016-01-29 — End: 2016-01-30
  Administered 2016-01-29: 50 mg via ORAL
  Filled 2016-01-29: qty 1

## 2016-01-29 MED ORDER — METOPROLOL TARTRATE 5 MG/5ML IV SOLN: 2.5000 mg | Freq: Once | INTRAVENOUS | Status: DC

## 2016-01-29 MED ORDER — SODIUM CHLORIDE 0.9 % IV BOLUS (SEPSIS)
500.0000 mL | Freq: Once | INTRAVENOUS | Status: AC
  Administered 2016-01-29: 500 mL via INTRAVENOUS

## 2016-01-29 MED ORDER — MAGNESIUM SULFATE 2 GM/50ML IV SOLN
2.0000 g | Freq: Once | INTRAVENOUS | Status: AC
  Administered 2016-01-29: 2 g via INTRAVENOUS
  Filled 2016-01-29: qty 50

## 2016-01-29 MED ORDER — METOPROLOL TARTRATE 5 MG/5ML IV SOLN
5.0000 mg | Freq: Once | INTRAVENOUS | Status: AC
  Administered 2016-01-29: 5 mg via INTRAVENOUS
  Filled 2016-01-29: qty 5

## 2016-01-29 NOTE — Consult Note (Signed)
   [facility] CM Inpatient Consult   01/29/2016  [name] 01-09-1926 [id number]  Patient screened for potential [facility] Care Management services. Patient is eligible for [facility] Care Management services under patient's [employer] ACO registry Medicare  plan.  Patient has had [facility] Care Management in the past.  Patient currently having cardiac issues this morning.  Spoke with inpatient RNCM about patient's eligibility.  Patient's disposition and needs are not determined at this time.  Patient had lived with daughter prior to admission.  Will continue to follow for progress and disposition needs as appropriate for community care management services with [facility].  For questions contact:   Charlesetta Shanks, RN BSN CCM [facility]  [phone] business mobile phone Toll free office [phone]

## 2016-01-29 NOTE — Progress Notes (Signed)
ANTICOAGULATION CONSULT NOTE - Follow Up Consult  Pharmacy Consult for Heparin (while Xarelto on hold) Indication: atrial fibrillation  Allergies  Allergen Reactions  . Amiodarone Other (See Comments)    Contributed to hypoxia    Vital Signs: Temp: 97.5 F (36.4 C) (06/13 0900) Temp Source: Oral (06/13 0435) BP: 104/72 mmHg (06/13 0900) Pulse Rate: 121 (06/13 0900)  Labs:  Recent Labs  01/26/16 1609 01/27/16 0452 01/28/16 0416 01/29/16 0331  HGB 13.6 12.5 12.5 11.3*  HCT 42.3 40.5 40.2 35.4*  PLT 363 303 287 323  APTT  --   --   --  43*  HEPARINUNFRC  --   --   --  1.20*  CREATININE 1.22* 1.16* 0.95  --     Assessment: Heparin for afib while Xarelto on hold for procedure, using aPTTs to dose heparin for now given Xarelto influence on anti-Xa levels (current anti-Xa is 1.20).  APTT remains subtherapeutic (47) on rate increase to 1000 units/h. Hg down 11.3, plt wnl, no bleed documented.  Goal of Therapy:  Heparin level 0.3-0.7 units/ml aPTT 66-102 seconds Monitor platelets by anticoagulation protocol: Yes   Plan:  - Increase heparin to 1200 units/h - 8h HL, daily HL/CBC - Monitor s/sx bleeding - F/U resuming Xarelto as appropriate - angiogram 6/14   Babs Bertin, PharmD, BCPS Clinical Pharmacist Pager [phone] 01/29/2016 1:32 PM

## 2016-01-29 NOTE — Progress Notes (Signed)
Patient had 3 beats of V Tach, 6 beats of Vtach, notified Dr. Arbutus Leas, she is currently eating.  She had another 2 more runs of Vtach.

## 2016-01-29 NOTE — Progress Notes (Signed)
  Echocardiogram 2D Echocardiogram has been performed.  Cathie Beams 01/29/2016, 10:58 AM

## 2016-01-29 NOTE — Progress Notes (Signed)
Patient has stage 2 on the sacrum, while conversing with the daughter she told her mom had it before she came to the hospital.  It is not a new one.

## 2016-01-29 NOTE — Care Management Important Message (Signed)
Important Message  Patient Details  Name: [name] MRN: [id number] Date of Birth: 1926/08/01   Medicare Important Message Given:  Yes    Bernadette Hoit 01/29/2016, 8:55 AM

## 2016-01-29 NOTE — Care Management Note (Signed)
Case Management Note  Patient Details  Name: Lenelle Blakeslee MRN: [id number] Date of Birth: 1926-04-28  Subjective/Objective:                 CM following for progression and d/c planning.    Action/Plan: 01/29/2016 CM following , assessment at this point by this CM is that this pt will most likely need rehab at the time of d/c. Possibility of surgery, however not medically stable for testing at this time. Will assist with any needs.   Expected Discharge Date:                  Expected Discharge Plan:  Skilled Nursing Facility  In-House Referral:  Clinical Social Work  Discharge planning Services  CM Consult  Post Acute Care Choice:    Choice offered to:     DME Arranged:    DME Agency:     HH Arranged:    HH Agency:     Status of Service:  In process, will continue to follow  Medicare Important Message Given:  Yes Date Medicare IM Given:    Medicare IM give by:    Date Additional Medicare IM Given:    Additional Medicare Important Message give by:     If discussed at Long Length of Stay Meetings, dates discussed:    Additional Comments:  Starlyn Skeans, RN 01/29/2016, 12:01 PM

## 2016-01-29 NOTE — Progress Notes (Addendum)
PROGRESS NOTE  [name] QMG:[id number] DOB: 1925-10-06 DOA: 01/26/2016 PCP: Evelena Peat, DO Brief History:  82 year old female with a history of diabetes mellitus, atrial fibrillation, hypertension, Churg-Strauss syndrome, combined systolic and diastolic CHF presented with concerns of diabetic foot infection. The patient normally lives with her daughter who stated that one of the rubber shoes came off of the 4 point walker causing the exposed metal frame to come down on the patient's foot resulting in injury approximately 2 weeks ago. There was concern of increasing pain and the foot looking worse resulting in the patient coming to the hospital. Upon presentation, the patient was noted to have a low-grade temperature with tachycardia and lactic acid up to 3.06. The patient was initially started on vancomycin and Zosyn. Blood cultures ultimately grew MSSA and antibiotics have been narrowed to cefazolin. Vascular surgery was consulted due to peripheral arterial disease of the right lower extremity. LE angiogram is planned for 01/30/2016.  In preparation, the patient xarelto was discontinued, and the patient was started on intravenous heparin.  Assessment/Plan: Sepsis  -present at time of admission -secondary to MSSA bacteremia -lactic acid improved with IVF -continue cefazolin -ID following  MSSA bacteremia -source is foot -ID following -6/13--TTE--no vegetation, EF 25-30%, diffuse HK -surveillance blood cultures positive-->repeat blood cultures on 6/14 -TEE per ID  Peripheral vascular disease with ischemic foot changes -I do not feel pt truly had infection in foot to begin with--did not have erythema, drainage, nor worsen edema;  Major symptom was pain -anterior foot with eschar without erythema or drainage -Appreciate vascular surgery consult-->angiogram on 01/30/16-->d/c xarelto -04/09/2015 bilateral ABIs falsely Elevated -04/09/2015 R toe TBI --0.46;  L-toe--0.75 -01/28/16 R-ABI--0.76, L-ABI--noncompressible -decubitus heel ulcer--present at time of admission- -appreciate wound care recommendations -01/27/2016 MRI foot erosive lesion medial head first metatarsal with low-level edema and enhancement; diffuse soft tissue edema of the ankle and foot  diabetes mellitus type 2 with renal manifestations -09/14/2015 hemoglobin A1c 7.5 -Continue NovoLog sliding scale -01/27/2016 hemoglobin A1c 7.9 -hold home metformin  Chronic systolic CHF/NICM -09/21/2014 echo EF 20-25%, severe diffuse HK, severe TR, severe RV dyfx -Continue furosemide home dose--80 am/40pm -Continue lisinopril and metoprolol succinate  Chronic Atrial Fibrillation -CHADS-VASc = 6 -01/29/16-had RVR due to holding metoprolol (low BP) previous day-->lopressor 5 mg IV x 1 -continue xarelto--decrease dose to 15 mg daily due to CrCl < 30 -restarted metoprolol succinate at lower dose -since xarelto d/ced-->start heparin bridge until angiogram on 01/30/16   Disposition Plan: Home in 3-4 days  Family Communication: daughter updated at beside 6/13--total time 35 min; >50% spent counseling and coordinating care  Consultants: ID, Vascular surgery  Code Status: FULL  Subjective:   Objective: Filed Vitals:   01/29/16 0435 01/29/16 0900 01/29/16 0951 01/29/16 1700  BP: 97/51 104/72  108/52  Pulse: 89 121  107  Temp: 97.6 F (36.4 C) 97.5 F (36.4 C)  98.6 F (37 C)  TempSrc: Oral   Oral  Resp: 17   20  SpO2: 96% 95% 96% 94%    Intake/Output Summary (Last 24 hours) at 01/29/16 1846 Last data filed at 01/29/16 1813  Gross per 24 hour  Intake   1300 ml  Output      0 ml  Net   1300 ml   Weight change:  Exam:   General:  Pt is alert, follows commands appropriately, not in acute distress  HEENT: No icterus, No thrush, No neck mass, [place]/AT  Cardiovascular:  RRR, S1/S2, no rubs, no gallops  Respiratory: CTA bilaterally, no wheezing, no crackles, no  rhonchi  Abdomen: Soft/+BS, non tender, non distended, no guarding  Extremities: No edema, No lymphangitis, No petechiae, No rashes, no synovitis;right forefoot with eschar lesions on the first metatarsal phalangeal joint area and fifth metatarsophalangeal joint area. No drainage, no erythema.   Data Reviewed: I have personally reviewed following labs and imaging studies Basic Metabolic Panel:  Recent Labs Lab 01/26/16 1458 01/26/16 1609 01/27/16 0452 01/28/16 0416 01/29/16 0331  NA 138 137 138 136  --   K 4.1 4.2 3.7 3.5  --   CL 95* 97* 98* 96*  --   CO2  --  --   GLUCOSE 174* 160* 124* 123*  --   BUN --   CREATININE 1.10* 1.22* 1.16* 0.95  --   CALCIUM  --  8.3* 8.0* 7.5*  --   MG  --   --   --   --  1.7   Liver Function Tests:  Recent Labs Lab 01/26/16 1609  AST 17  ALT 7*  ALKPHOS 122  BILITOT 1.4*  PROT 8.1  ALBUMIN 3.6   No results for input(s): LIPASE, AMYLASE in the last 168 hours. No results for input(s): AMMONIA in the last 168 hours. Coagulation Profile: No results for input(s): INR, PROTIME in the last 168 hours. CBC:  Recent Labs Lab 01/26/16 1458 01/26/16 1609 01/27/16 0452 01/28/16 0416 01/29/16 0331  WBC  --  6.7 8.3 9.6 7.6  NEUTROABS  --  4.9  --   --   --   HGB 14.6 13.6 12.5 12.5 11.3*  HCT 43.0 42.3 40.5 40.2 35.4*  MCV  --  94.2 96.4 95.9 92.7  PLT  --  363 303 287 323   Cardiac Enzymes: No results for input(s): CKTOTAL, CKMB, CKMBINDEX, TROPONINI in the last 168 hours. BNP: Invalid input(s): POCBNP CBG:  Recent Labs Lab 01/28/16 1755 01/28/16 1958 01/29/16 0746 01/29/16 1138 01/29/16 1643  GLUCAP 139* 179* 162* 283* 72   HbA1C:  Recent Labs  01/27/16 0452 01/27/16 1554  HGBA1C 8.5* 7.9*   Urine analysis:    Component Value Date/Time   COLORURINE YELLOW 01/27/2016 0115   APPEARANCEUR CLEAR 01/27/2016 0115   LABSPEC 1.010 01/27/2016 0115   PHURINE 5.5 01/27/2016 0115   GLUCOSEU  NEGATIVE 01/27/2016 0115   HGBUR TRACE* 01/27/2016 0115   HGBUR trace-lysed 07/17/2008 0902   BILIRUBINUR NEGATIVE 01/27/2016 0115   KETONESUR NEGATIVE 01/27/2016 0115   PROTEINUR 30* 01/27/2016 0115   UROBILINOGEN 1.0 02/17/2014 1259   NITRITE NEGATIVE 01/27/2016 0115   LEUKOCYTESUR NEGATIVE 01/27/2016 0115   Sepsis Labs: (procalcitonin:4,lacticidven:4) ) Recent Results (from the past 240 hour(s))  Blood Culture (routine x 2)     Status: Abnormal   Collection Time: 01/26/16  5:58 PM  Result Value Ref Range Status   Specimen Description BLOOD WRIST LEFT  Final   Special Requests BOTTLES DRAWN AEROBIC ONLY 6CC  Final   Culture  Setup Time   Final    GRAM POSITIVE COCCI IN CLUSTERS AEROBIC BOTTLE ONLY CRITICAL RESULT CALLED TO, READ BACK BY AND VERIFIED WITH: R RUMBARGER,PHARMD AT 0957 01/27/16    Culture STAPHYLOCOCCUS AUREUS (A)  Final   Report Status 01/29/2016 FINAL  Final   Organism ID, Bacteria STAPHYLOCOCCUS AUREUS  Final      Susceptibility   Staphylococcus aureus - MIC*    CIPROFLOXACIN <=0.5 SENSITIVE  Sensitive     ERYTHROMYCIN <=0.25 SENSITIVE Sensitive     GENTAMICIN <=0.5 SENSITIVE Sensitive     OXACILLIN 0.5 SENSITIVE Sensitive     TETRACYCLINE <=1 SENSITIVE Sensitive     VANCOMYCIN <=0.5 SENSITIVE Sensitive     TRIMETH/SULFA <=10 SENSITIVE Sensitive     CLINDAMYCIN <=0.25 SENSITIVE Sensitive     RIFAMPIN <=0.5 SENSITIVE Sensitive     Inducible Clindamycin NEGATIVE Sensitive     * STAPHYLOCOCCUS AUREUS  Blood Culture ID Panel (Reflexed)     Status: Abnormal   Collection Time: 01/26/16  5:58 PM  Result Value Ref Range Status   Enterococcus species NOT DETECTED NOT DETECTED Final   Vancomycin resistance NOT DETECTED NOT DETECTED Final   Listeria monocytogenes NOT DETECTED NOT DETECTED Final   Staphylococcus species DETECTED (A) NOT DETECTED Final    Comment: CRITICAL RESULT CALLED TO, READ BACK BY AND VERIFIED WITH: R RUMBARGER,PHARMD AT 0957  01/27/16    Staphylococcus aureus DETECTED (A) NOT DETECTED Final    Comment: CRITICAL RESULT CALLED TO, READ BACK BY AND VERIFIED WITH: R RUMBARGER,PHARMD AT 0957 01/27/16    Methicillin resistance NOT DETECTED NOT DETECTED Final   Streptococcus species NOT DETECTED NOT DETECTED Final   Streptococcus agalactiae NOT DETECTED NOT DETECTED Final   Streptococcus pneumoniae NOT DETECTED NOT DETECTED Final   Streptococcus pyogenes NOT DETECTED NOT DETECTED Final   Acinetobacter baumannii NOT DETECTED NOT DETECTED Final   Enterobacteriaceae species NOT DETECTED NOT DETECTED Final   Enterobacter cloacae complex NOT DETECTED NOT DETECTED Final   Escherichia coli NOT DETECTED NOT DETECTED Final   Klebsiella oxytoca NOT DETECTED NOT DETECTED Final   Klebsiella pneumoniae NOT DETECTED NOT DETECTED Final   Proteus species NOT DETECTED NOT DETECTED Final   Serratia marcescens NOT DETECTED NOT DETECTED Final   Carbapenem resistance NOT DETECTED NOT DETECTED Final   Haemophilus influenzae NOT DETECTED NOT DETECTED Final   Neisseria meningitidis NOT DETECTED NOT DETECTED Final   Pseudomonas aeruginosa NOT DETECTED NOT DETECTED Final   Candida albicans NOT DETECTED NOT DETECTED Final   Candida glabrata NOT DETECTED NOT DETECTED Final   Candida krusei NOT DETECTED NOT DETECTED Final   Candida parapsilosis NOT DETECTED NOT DETECTED Final   Candida tropicalis NOT DETECTED NOT DETECTED Final  Blood Culture (routine x 2)     Status: Abnormal   Collection Time: 01/26/16  6:01 PM  Result Value Ref Range Status   Specimen Description BLOOD RIGHT HAND  Final   Special Requests BOTTLES DRAWN AEROBIC AND ANAEROBIC 5CC  Final   Culture  Setup Time   Final    GRAM POSITIVE COCCI IN CLUSTERS IN BOTH AEROBIC AND ANAEROBIC BOTTLES CRITICAL RESULT CALLED TO, READ BACK BY AND VERIFIED WITH: R RUMBARGER,PHARMD AT 0957 01/27/16    Culture (A)  Final    STAPHYLOCOCCUS AUREUS SUSCEPTIBILITIES PERFORMED ON PREVIOUS  CULTURE WITHIN THE LAST 5 DAYS.    Report Status 01/29/2016 FINAL  Final  Urine culture     Status: Abnormal   Collection Time: 01/27/16  1:15 AM  Result Value Ref Range Status   Specimen Description URINE, RANDOM  Final   Special Requests NONE  Final   Culture MULTIPLE SPECIES PRESENT, SUGGEST RECOLLECTION (A)  Final   Report Status 01/28/2016 FINAL  Final  Blood Cultures x 2 sites     Status: None (Preliminary result)   Collection Time: 01/27/16  4:04 PM  Result Value Ref Range Status   Specimen Description BLOOD RIGHT ANTECUBITAL  Final   Special Requests BOTTLES DRAWN AEROBIC ONLY 5CC  Final   Culture NO GROWTH 2 DAYS  Final   Report Status PENDING  Incomplete  Blood Cultures x 2 sites     Status: Abnormal (Preliminary result)   Collection Time: 01/27/16  4:15 PM  Result Value Ref Range Status   Specimen Description BLOOD RIGHT WRIST  Final   Special Requests IN PEDIATRIC BOTTLE 2CC  Final   Culture  Setup Time   Final    GRAM POSITIVE COCCI IN CLUSTERS IN PEDIATRIC BOTTLE CRITICAL RESULT CALLED TO, READ BACK BY AND VERIFIED WITH: Celedonio Miyamoto  [id number] AT 0846 S.YARBROUGH    Culture STAPHYLOCOCCUS AUREUS (A)  Final   Report Status PENDING  Incomplete     Scheduled Meds: . brimonidine  1 drop Left Eye Q12H  .  ceFAZolin (ANCEF) IV  1 g Intravenous Q8H  . dorzolamide  1 drop Left Eye BID  . famotidine  20 mg Oral Daily  . ferrous sulfate  325 mg Oral Q breakfast  . furosemide  40 mg Oral QPM  . furosemide  80 mg Oral Daily  . insulin aspart  0-15 Units Subcutaneous TID WC  . metoprolol succinate  50 mg Oral Daily  . mometasone-formoterol  2 puff Inhalation BID  . multivitamin with minerals  1 tablet Oral Daily  . potassium chloride SA  40 mEq Oral Daily  . spironolactone  25 mg Oral Daily   Continuous Infusions: . heparin 1,200 Units/hr (01/29/16 1440)    Procedures/Studies: Dg Chest 2 View  01/26/2016  CLINICAL DATA:  Sepsis. EXAM: CHEST  2 VIEW COMPARISON:   Chest radiographs dated 11/24/2014 and chest CT dated 05/03/2015. FINDINGS: Stable enlarged cardiac silhouette and left basilar scarring. The trachea remains deviated to the right at the thoracic inlet. Diffuse osteopenia. IMPRESSION: 1. No acute abnormality. 2. Stable cardiomegaly and left basilar scarring. 3. Stable tracheal deviation to the right at the level of the thoracic inlet due to a previously demonstrated large thyroid nodule. Electronically Signed   By: Beckie Salts M.D.   On: 01/26/2016 19:39   Mr Foot Right W Wo Contrast  01/27/2016  CLINICAL DATA:  Foot pain with draining wounds.  Heel pain. EXAM: MRI OF THE RIGHT FOREFOOT WITHOUT AND WITH CONTRAST TECHNIQUE: Multiplanar, multisequence MR imaging was performed both before and after administration of intravenous contrast. CONTRAST:  7mL MULTIHANCE GADOBENATE DIMEGLUMINE 529 MG/ML IV SOLN COMPARISON:  01/26/2016 FINDINGS: Osteomyelitis protocol MRI of the foot was obtained, to include the entire foot and ankle. This protocol uses a large field of view to cover the entire foot and ankle, and is suitable for assessing bony structures for osteomyelitis. Due to the large field of view and imaging plane choice, this protocol is less sensitive for assessing small structures such as ligamentous structures of the foot and ankle, compared to a dedicated forefoot or dedicated hindfoot exam. Despite efforts by the technologist and patient, motion artifact is present on today's exam and could not be eliminated. This reduces exam sensitivity and specificity. There is diffuse subcutaneous edema in the ankle, heel, and foot. In the head of the first metatarsal there is an erosive lesion dorsum medially. Reportedly this is in the vicinity of a skin wound, although the wound is poorly seen on today's MRI. Differential diagnostic considerations include early osteomyelitis versus gout arthropathy. No other osseous edema or significant abnormal osseous enhancement  observed to suggest osteomyelitis elsewhere. Incidental note is made of a  degenerative subcortical cyst or geode distally in the middle cuneiform. No malalignment at the Lisfranc joint. Thickened plantar fascia raises possibility plantar fasciitis. No drainable abscess. No significant joint effusion to further suggest septic joint. IMPRESSION: 1. Erosive lesion along the dorsal -medial head of the first metatarsal with associated low-level edema and enhancement. This resembles a gout lesion or erosive arthropathy, but by report there is a draining wound in this vicinity which increases the likelihood that this could represent localized early osteomyelitis. 2. Diffuse soft tissue edema in the ankle and foot, nonspecific for bland edema versus cellulitis. 3. Thickened plantar fascia suggesting plantar fasciitis. 4. This final interpretation correlates with the preliminary report issued by Dr. Cherly Hensen. Electronically Signed   By: Gaylyn Rong M.D.   On: 01/27/2016 13:32   Dg Foot Complete Right  01/26/2016  CLINICAL DATA:  Right foot wounds, diabetes EXAM: RIGHT FOOT COMPLETE - 3+ VIEW COMPARISON:  12/09/2013 FINDINGS: Three views of the right foot submitted. There is diffuse osteopenia. Mild soft tissue swelling adjacent to distal aspect of first metatarsal. No acute fracture or subluxation. Atherosclerotic vascular calcifications are noted. No bone destruction to suggest osteomyelitis. There is plantar spur of calcaneus. Dorsal spurring anterior aspect of the talus. IMPRESSION: No acute fracture or subluxation. Limited study by diffuse osteopenia. Mild soft tissue swelling adjacent to distal aspect of first metatarsal. No evidence of bone destruction to suggest osteomyelitis. Plantar spur of calcaneus. Degenerative changes with dorsal spurring anterior aspect of the talus. Electronically Signed   By: Natasha Mead M.D.   On: 01/26/2016 17:00    Matteson Blue, DO  [facility] Pager [phone]  If  7PM-7AM, please contact night-coverage [email or web address] Password [facility] 01/29/2016, 6:46 PM   LOS: 2 days

## 2016-01-29 NOTE — Progress Notes (Addendum)
The client had 5 beats of V-tach CCMD saved a strip. Upon looking other V-tach strips have been saved this admission. I notified Schorr on call with [facility] about the non-sustained beats and she ordered a magnesium lab for 0500. I will continue to monitor the client closely.    Addendum:   The client had two more runs of V-tach, 5 beats and 7 beats. Magnesium is 1.7 on morning labs. Schorr ordered 2 g of magnesium to be given.

## 2016-01-29 NOTE — Progress Notes (Signed)
ANTICOAGULATION CONSULT NOTE - Follow Up Consult  Pharmacy Consult for Heparin (while Xarelto on hold) Indication: atrial fibrillation  Allergies  Allergen Reactions  . Amiodarone Other (See Comments)    Contributed to hypoxia    Vital Signs: Temp: 97.6 F (36.4 C) (06/13 0435) Temp Source: Oral (06/12 2001) BP: 97/51 mmHg (06/13 0435) Pulse Rate: 89 (06/13 0435)  Labs:  Recent Labs  01/26/16 1609 01/27/16 0452 01/28/16 0416 01/29/16 0331  HGB 13.6 12.5 12.5 11.3*  HCT 42.3 40.5 40.2 35.4*  PLT 363 303 287 323  APTT  --   --   --  43*  HEPARINUNFRC  --   --   --  1.20*  CREATININE 1.22* 1.16* 0.95  --     Assessment: Heparin for afib while Xarelto on hold for procedure, initial aPTT on heparin is sub-therapeutic, using aPTT to dose heparin for now given Xarelto influence on anti-Xa levels (current anti-Xa is 1.20)  Goal of Therapy:  Heparin level 0.3-0.7 units/ml aPTT 66-102 seconds Monitor platelets by anticoagulation protocol: Yes   Plan:  -Inc heparin to 1000 units/hr -1300 aPTT  Abran Duke 01/29/2016,4:55 AM

## 2016-01-29 NOTE — Progress Notes (Addendum)
[facility]   Reason for visit: Follow up on MSSA bacteremia  Interval History: seen by vascular surgery and to get angiogram today.  MRI with possible osteomyelitis of first metatarsal  Physical Exam: Constitutional:  Filed Vitals:   01/29/16 0435 01/29/16 0900  BP: 97/51 104/72  Pulse: 89 121  Temp: 97.6 F (36.4 C) 97.5 F (36.4 C)  Resp: 17    patient appears in NAD Respiratory: Normal respiratory effort; CTA B Cardiovascular: RRR  Review of Systems: Constitutional: negative for fevers Gastrointestinal: negative for diarrhea  Lab Results  Component Value Date   WBC 7.6 01/29/2016   HGB 11.3* 01/29/2016   HCT 35.4* 01/29/2016   MCV 92.7 01/29/2016   PLT 323 01/29/2016    Lab Results  Component Value Date   CREATININE 0.95 01/28/2016   BUN 13 01/28/2016   NA 136 01/28/2016   K 3.5 01/28/2016   CL 96* 01/28/2016   CO2 30 01/28/2016    Lab Results  Component Value Date   ALT 7* 01/26/2016   AST 17 01/26/2016   ALKPHOS 122 01/26/2016     Microbiology: Recent Results (from the past 240 hour(s))  Blood Culture (routine x 2)     Status: Abnormal   Collection Time: 01/26/16  5:58 PM  Result Value Ref Range Status   Specimen Description BLOOD WRIST LEFT  Final   Special Requests BOTTLES DRAWN AEROBIC ONLY 6CC  Final   Culture  Setup Time   Final    GRAM POSITIVE COCCI IN CLUSTERS AEROBIC BOTTLE ONLY CRITICAL RESULT CALLED TO, READ BACK BY AND VERIFIED WITH: R RUMBARGER,PHARMD AT 0957 01/27/16    Culture STAPHYLOCOCCUS AUREUS (A)  Final   Report Status 01/29/2016 FINAL  Final   Organism ID, Bacteria STAPHYLOCOCCUS AUREUS  Final      Susceptibility   Staphylococcus aureus - MIC*    CIPROFLOXACIN <=0.5 SENSITIVE Sensitive     ERYTHROMYCIN <=0.25 SENSITIVE Sensitive     GENTAMICIN <=0.5 SENSITIVE Sensitive     OXACILLIN 0.5 SENSITIVE Sensitive     TETRACYCLINE <=1 SENSITIVE Sensitive     VANCOMYCIN <=0.5 SENSITIVE Sensitive       TRIMETH/SULFA <=10 SENSITIVE Sensitive     CLINDAMYCIN <=0.25 SENSITIVE Sensitive     RIFAMPIN <=0.5 SENSITIVE Sensitive     Inducible Clindamycin NEGATIVE Sensitive     * STAPHYLOCOCCUS AUREUS  Blood Culture ID Panel (Reflexed)     Status: Abnormal   Collection Time: 01/26/16  5:58 PM  Result Value Ref Range Status   Enterococcus species NOT DETECTED NOT DETECTED Final   Vancomycin resistance NOT DETECTED NOT DETECTED Final   Listeria monocytogenes NOT DETECTED NOT DETECTED Final   Staphylococcus species DETECTED (A) NOT DETECTED Final    Comment: CRITICAL RESULT CALLED TO, READ BACK BY AND VERIFIED WITH: R RUMBARGER,PHARMD AT 0957 01/27/16    Staphylococcus aureus DETECTED (A) NOT DETECTED Final    Comment: CRITICAL RESULT CALLED TO, READ BACK BY AND VERIFIED WITH: R RUMBARGER,PHARMD AT 0957 01/27/16    Methicillin resistance NOT DETECTED NOT DETECTED Final   Streptococcus species NOT DETECTED NOT DETECTED Final   Streptococcus agalactiae NOT DETECTED NOT DETECTED Final   Streptococcus pneumoniae NOT DETECTED NOT DETECTED Final   Streptococcus pyogenes NOT DETECTED NOT DETECTED Final   Acinetobacter baumannii NOT DETECTED NOT DETECTED Final   Enterobacteriaceae species NOT DETECTED NOT DETECTED Final   Enterobacter cloacae complex NOT DETECTED NOT DETECTED Final   Escherichia coli  NOT DETECTED NOT DETECTED Final   Klebsiella oxytoca NOT DETECTED NOT DETECTED Final   Klebsiella pneumoniae NOT DETECTED NOT DETECTED Final   Proteus species NOT DETECTED NOT DETECTED Final   Serratia marcescens NOT DETECTED NOT DETECTED Final   Carbapenem resistance NOT DETECTED NOT DETECTED Final   Haemophilus influenzae NOT DETECTED NOT DETECTED Final   Neisseria meningitidis NOT DETECTED NOT DETECTED Final   Pseudomonas aeruginosa NOT DETECTED NOT DETECTED Final   Candida albicans NOT DETECTED NOT DETECTED Final   Candida glabrata NOT DETECTED NOT DETECTED Final   Candida krusei NOT DETECTED  NOT DETECTED Final   Candida parapsilosis NOT DETECTED NOT DETECTED Final   Candida tropicalis NOT DETECTED NOT DETECTED Final  Blood Culture (routine x 2)     Status: Abnormal   Collection Time: 01/26/16  6:01 PM  Result Value Ref Range Status   Specimen Description BLOOD RIGHT HAND  Final   Special Requests BOTTLES DRAWN AEROBIC AND ANAEROBIC 5CC  Final   Culture  Setup Time   Final    GRAM POSITIVE COCCI IN CLUSTERS IN BOTH AEROBIC AND ANAEROBIC BOTTLES CRITICAL RESULT CALLED TO, READ BACK BY AND VERIFIED WITH: R RUMBARGER,PHARMD AT 0957 01/27/16    Culture (A)  Final    STAPHYLOCOCCUS AUREUS SUSCEPTIBILITIES PERFORMED ON PREVIOUS CULTURE WITHIN THE LAST 5 DAYS.    Report Status 01/29/2016 FINAL  Final  Urine culture     Status: Abnormal   Collection Time: 01/27/16  1:15 AM  Result Value Ref Range Status   Specimen Description URINE, RANDOM  Final   Special Requests NONE  Final   Culture MULTIPLE SPECIES PRESENT, SUGGEST RECOLLECTION (A)  Final   Report Status 01/28/2016 FINAL  Final  Blood Cultures x 2 sites     Status: None (Preliminary result)   Collection Time: 01/27/16  4:04 PM  Result Value Ref Range Status   Specimen Description BLOOD RIGHT ANTECUBITAL  Final   Special Requests BOTTLES DRAWN AEROBIC ONLY 5CC  Final   Culture NO GROWTH < 24 HOURS  Final   Report Status PENDING  Incomplete  Blood Cultures x 2 sites     Status: Abnormal (Preliminary result)   Collection Time: 01/27/16  4:15 PM  Result Value Ref Range Status   Specimen Description BLOOD RIGHT WRIST  Final   Special Requests IN PEDIATRIC BOTTLE 2CC  Final   Culture  Setup Time   Final    GRAM POSITIVE COCCI IN CLUSTERS IN PEDIATRIC BOTTLE CRITICAL RESULT CALLED TO, READ BACK BY AND VERIFIED WITH: Celedonio Miyamoto  [id number] AT 0846 S.YARBROUGH    Culture STAPHYLOCOCCUS AUREUS (A)  Final   Report Status PENDING  Incomplete    Impression/Plan:  1. MSSA bacteremia - possibly from foot infection, on cefazolin.   TTE pending. Repeat blood cultures again positive 1/2.  Will repeat tomorrow With persistently positive blood culture, will need a TEE or prolonged antibiotic course  2.  Wound infection - angiogram Today.  ABI non compressible.

## 2016-01-29 NOTE — Progress Notes (Signed)
Pharmacy Antibiotic Note  [name] is a 82 y.o. female admitted on 01/26/2016 with MSSA bacteremia due to cellulitis.  Pharmacy has been consulted for cefazolin dosing. Repeat BC +. Afeb, WBC wnl. MRI L foot - likely area of osteomyelitis. ID consulted - rec TTE, angiogram Wed.  6/10 BCx - MSSA (2/2) (BCID - MSSA) 6/11 UA - few bacteria, 0-5 WBCs, neg nitrite, neg leukocyte 6/11 UCx - suggest recollect 6/11 BCx - SAur (will not do BCID)  Vancomycin 6/10>6/11 Zosyn 6/10>6/11 Ancef 6/11>  Plan: Cefazolin 1g IV q8h Monitor clinical progress, c/s, renal function, LOT   Temp (24hrs), Avg:98.5 F (36.9 C), Min:97.6 F (36.4 C), Max:99 F (37.2 C)   Recent Labs Lab 01/22/16 1615 01/26/16 1458 01/26/16 1609 01/26/16 1625 01/26/16 1747 01/26/16 2117 01/27/16 0452 01/28/16 0416 01/29/16 0331  WBC 4.0  --  6.7  --   --   --  8.3 9.6 7.6  CREATININE 1.17* 1.10* 1.22*  --   --   --  1.16* 0.95  --   LATICACIDVEN  --   --   --  2.46* 3.06* 1.94  --   --   --     Estimated Creatinine Clearance: 39.8 mL/min (by C-G formula based on Cr of 0.95).    Allergies  Allergen Reactions  . Amiodarone Other (See Comments)    Contributed to hypoxia    Babs Bertin, PharmD, [facility] Clinical Pharmacist Pager [phone] 01/29/2016 8:56 AM

## 2016-01-30 ENCOUNTER — Inpatient Hospital Stay (HOSPITAL_COMMUNITY): Admission: RE | Admit: 2016-01-30 | Payer: Medicare Other | Source: Ambulatory Visit

## 2016-01-30 ENCOUNTER — Encounter (HOSPITAL_COMMUNITY)
Admission: EM | Disposition: A | Discharge status: Home Health | Payer: Self-pay | Source: Home / Self Care | Attending: Internal Medicine

## 2016-01-30 ENCOUNTER — Encounter: Payer: Self-pay | Admitting: *Deleted

## 2016-01-30 DIAGNOSIS — I70234 Atherosclerosis of native arteries of right leg with ulceration of heel and midfoot: Secondary | ICD-10-CM

## 2016-01-30 DIAGNOSIS — I4891 Unspecified atrial fibrillation: Secondary | ICD-10-CM

## 2016-01-30 DIAGNOSIS — A4901 Methicillin susceptible Staphylococcus aureus infection, unspecified site: Secondary | ICD-10-CM

## 2016-01-30 HISTORY — PX: PERIPHERAL VASCULAR CATHETERIZATION: SHX172C

## 2016-01-30 LAB — GLUCOSE, CAPILLARY
Glucose-Capillary: 166 mg/dL — ABNORMAL HIGH (ref 65–99)
Glucose-Capillary: 95 mg/dL (ref 65–99)
Glucose-Capillary: 119 mg/dL — ABNORMAL HIGH (ref 65–99)
Glucose-Capillary: 184 mg/dL — ABNORMAL HIGH (ref 65–99)
Glucose-Capillary: 200 mg/dL — ABNORMAL HIGH (ref 65–99)

## 2016-01-30 LAB — APTT
APTT: 65 s — AB (ref 24–37)
aPTT: 94 seconds — ABNORMAL HIGH (ref 24–37)

## 2016-01-30 LAB — CBC
HCT: 38.8 % (ref 36.0–46.0)
Hemoglobin: 12.1 g/dL (ref 12.0–15.0)
MCH: 29.5 pg (ref 26.0–34.0)
MCHC: 31.2 g/dL (ref 30.0–36.0)
MCV: 94.6 fL (ref 78.0–100.0)
Platelets: 337 10*3/uL (ref 150–400)
RBC: 4.1 MIL/uL (ref 3.87–5.11)
RDW: 14.6 % (ref 11.5–15.5)
WBC: 7.6 10*3/uL (ref 4.0–10.5)

## 2016-01-30 LAB — POCT ACTIVATED CLOTTING TIME
ACTIVATED CLOTTING TIME: 230 s
ACTIVATED CLOTTING TIME: 241 s
Activated Clotting Time: 186 seconds
Activated Clotting Time: 180 seconds

## 2016-01-30 LAB — CULTURE, BLOOD (ROUTINE X 2)

## 2016-01-30 LAB — HEPARIN LEVEL (UNFRACTIONATED): HEPARIN UNFRACTIONATED: 0.84 [IU]/mL — AB (ref 0.30–0.70)

## 2016-01-30 SURGERY — ABDOMINAL AORTOGRAM W/LOWER EXTREMITY
Laterality: Right

## 2016-01-30 MED ORDER — IODIXANOL 320 MG/ML IV SOLN
INTRAVENOUS | Status: DC | PRN
  Administered 2016-01-30: 135 mL via INTRA_ARTERIAL

## 2016-01-30 MED ORDER — HEPARIN SODIUM (PORCINE) 1000 UNIT/ML IJ SOLN
INTRAMUSCULAR | Status: AC
  Filled 2016-01-30: qty 1

## 2016-01-30 MED ORDER — HEPARIN (PORCINE) IN NACL 2-0.9 UNIT/ML-% IJ SOLN
INTRAMUSCULAR | Status: AC
  Filled 2016-01-30: qty 500

## 2016-01-30 MED ORDER — LIDOCAINE HCL (PF) 1 % IJ SOLN
INTRAMUSCULAR | Status: DC | PRN
  Administered 2016-01-30: 15 mL via SUBCUTANEOUS

## 2016-01-30 MED ORDER — FENTANYL CITRATE (PF) 100 MCG/2ML IJ SOLN
INTRAMUSCULAR | Status: AC
  Filled 2016-01-30: qty 2

## 2016-01-30 MED ORDER — DILTIAZEM LOAD VIA INFUSION
10.0000 mg | Freq: Once | INTRAVENOUS | Status: DC
  Filled 2016-01-30: qty 10

## 2016-01-30 MED ORDER — DILTIAZEM HCL 25 MG/5ML IV SOLN: 10.0000 mg | Freq: Once | INTRAVENOUS | Status: DC

## 2016-01-30 MED ORDER — LABETALOL HCL 5 MG/ML IV SOLN
INTRAVENOUS | Status: AC
  Filled 2016-01-30: qty 4

## 2016-01-30 MED ORDER — DILTIAZEM HCL 25 MG/5ML IV SOLN
10.0000 mg | Freq: Once | INTRAVENOUS | Status: AC
  Administered 2016-01-30: 10 mg via INTRAVENOUS
  Filled 2016-01-30: qty 5

## 2016-01-30 MED ORDER — LIDOCAINE HCL (PF) 1 % IJ SOLN
INTRAMUSCULAR | Status: AC
  Filled 2016-01-30: qty 30

## 2016-01-30 MED ORDER — DILTIAZEM LOAD VIA INFUSION
10.0000 mg | Freq: Once | INTRAVENOUS | Status: AC
  Administered 2016-01-30: 10 mg via INTRAVENOUS
  Filled 2016-01-30: qty 10

## 2016-01-30 MED ORDER — DIPHENHYDRAMINE HCL 50 MG/ML IJ SOLN
INTRAMUSCULAR | Status: AC
  Filled 2016-01-30: qty 1

## 2016-01-30 MED ORDER — SODIUM CHLORIDE 0.9 % IV SOLN
INTRAVENOUS | Status: DC
  Administered 2016-01-31: 03:00:00 via INTRAVENOUS

## 2016-01-30 MED ORDER — HEPARIN (PORCINE) IN NACL 2-0.9 UNIT/ML-% IJ SOLN
INTRAMUSCULAR | Status: DC | PRN
  Administered 2016-01-30: 1000 mL

## 2016-01-30 MED ORDER — FENTANYL CITRATE (PF) 100 MCG/2ML IJ SOLN
INTRAMUSCULAR | Status: DC | PRN
  Administered 2016-01-30 (×2): 12.5 ug via INTRAVENOUS

## 2016-01-30 MED ORDER — HEPARIN (PORCINE) IN NACL 2-0.9 UNIT/ML-% IJ SOLN
INTRAMUSCULAR | Status: DC | PRN
  Administered 2016-01-30: 1000 mL via INTRA_ARTERIAL

## 2016-01-30 MED ORDER — HEPARIN SODIUM (PORCINE) 1000 UNIT/ML IJ SOLN
INTRAMUSCULAR | Status: DC | PRN
  Administered 2016-01-30: 6500 [IU] via INTRAVENOUS

## 2016-01-30 MED ORDER — METOPROLOL TARTRATE 50 MG PO TABS
50.0000 mg | ORAL_TABLET | Freq: Two times a day (BID) | ORAL | Status: DC
  Administered 2016-01-30 – 2016-02-03 (×7): 50 mg via ORAL
  Filled 2016-01-30 (×8): qty 1

## 2016-01-30 MED ORDER — HEPARIN (PORCINE) IN NACL 100-0.45 UNIT/ML-% IJ SOLN
1200.0000 [IU]/h | INTRAMUSCULAR | Status: DC
  Administered 2016-01-30: 1200 [IU]/h via INTRAVENOUS
  Filled 2016-01-30: qty 250

## 2016-01-30 MED ORDER — SODIUM CHLORIDE 0.9 % IV SOLN
INTRAVENOUS | Status: DC | PRN
  Administered 2016-01-30: 50 mL/h via INTRAVENOUS

## 2016-01-30 MED ORDER — DILTIAZEM HCL 100 MG IV SOLR
5.0000 mg/h | INTRAVENOUS | Status: DC
  Administered 2016-01-30: 5 mg/h via INTRAVENOUS
  Filled 2016-01-30: qty 100

## 2016-01-30 SURGICAL SUPPLY — 26 items
BALLN COYOTE ES OTW 2.5X40X145 (BALLOONS) ×4
BALLN COYOTE ES OTW 2X40X144 (BALLOONS) ×3
BALLN COYOTE OTW 2.5X150X150 (BALLOONS) ×4
BALLOON COYOTE ES OTW 2X40X144 (BALLOONS) IMPLANT
BALLOON COYOTE OTW 2.5X150X150 (BALLOONS) IMPLANT
BALLOON CYTE ES OTW 2.5X40X145 (BALLOONS) IMPLANT
CATH CXI SUPP ST 2.6FR 150CM (MICROCATHETER) ×2 IMPLANT
CATH OMNI FLUSH 5F 65CM (CATHETERS) ×2 IMPLANT
CATH SOFT-VU 4F 65 STRAIGHT (CATHETERS) IMPLANT
CATH SOFT-VU STRAIGHT 4F 65CM (CATHETERS) ×4
DEVICE CONTINUOUS FLUSH (MISCELLANEOUS) ×2 IMPLANT
DEVICE TORQUE .014-.018 (MISCELLANEOUS) IMPLANT
DEVICE TORQUE H2O (MISCELLANEOUS) ×2 IMPLANT
GUIDEWIRE ANGLED .035X150CM (WIRE) ×2 IMPLANT
KIT ENCORE 26 ADVANTAGE (KITS) ×4 IMPLANT
KIT MICROINTRODUCER STIFF 5F (SHEATH) ×2 IMPLANT
KIT PV (KITS) ×4 IMPLANT
SHEATH HIGHFLEX ANSEL 6FRX55 (SHEATH) ×2 IMPLANT
SHEATH PINNACLE 5F 10CM (SHEATH) ×2 IMPLANT
SHIELD RADPAD SCOOP 12X17 (MISCELLANEOUS) ×2 IMPLANT
SYR MEDRAD MARK V 150ML (SYRINGE) ×4 IMPLANT
TORQUE DEVICE .014-.018 (MISCELLANEOUS) ×4
TRANSDUCER W/STOPCOCK (MISCELLANEOUS) ×4 IMPLANT
TRAY PV CATH (CUSTOM PROCEDURE TRAY) ×4 IMPLANT
WIRE BENTSON .035X145CM (WIRE) ×2 IMPLANT
WIRE ROSEN-J .035X180CM (WIRE) ×2 IMPLANT

## 2016-01-30 NOTE — Progress Notes (Signed)
ANTICOAGULATION CONSULT NOTE Pharmacy Consult for Heparin  Indication: atrial fibrillation  Allergies  Allergen Reactions  . Amiodarone Other (See Comments)    Contributed to hypoxia    Vital Signs: Temp: 99 F (37.2 C) (06/13 2046) Temp Source: Oral (06/13 2046) BP: 106/58 mmHg (06/13 2046) Pulse Rate: 85 (06/13 2046)  Labs:  Recent Labs  01/27/16 0452 01/28/16 0416 01/29/16 0331 01/29/16 1304 01/29/16 2230  HGB 12.5 12.5 11.3*  --   --   HCT 40.5 40.2 35.4*  --   --   PLT 303 287 323  --   --   APTT  --   --  43* 47* 94*  HEPARINUNFRC  --   --  1.20*  --  0.79*  CREATININE 1.16* 0.95  --   --   --     Assessment: 82 y.o. female with h/o Afib, Xarelto on hold for LE angiogram, for heparin.  aPTT within goal range tonight  Goal of Therapy:  Heparin level 0.3-0.7 units/ml aPTT 66-102 seconds Monitor platelets by anticoagulation protocol: Yes   Plan:  Continue Heparin at current rate  Geannie Risen, PharmD, BCPS  01/30/2016 12:27 AM

## 2016-01-30 NOTE — Progress Notes (Signed)
Site area: left groin Site Prior to Removal:  Level  0 Pressure Applied For:  20 minutes Manual:  yes  Patient Status During Pull:  stable Post Pull Site:  Level  0 Post Pull Instructions Given:  yes Post Pull Pulses Present: yes Dressing Applied:  tegaderm Bedrest begins @  1345   Comments:

## 2016-01-30 NOTE — Progress Notes (Addendum)
PROGRESS NOTE                                                                                                                                                                                                             Patient Demographics:    [name], is a 82 y.o. female, DOB - [birth year], ZOX:[id number]  Admit date - 01/26/2016   Admitting Physician Haydee Salter, MD  Outpatient Primary MD for the patient is Gust Rung, DO  LOS - 3  Outpatient Specialists: Heart failure clinic  Dr Imogene Burn  Chief Complaint  Patient presents with  . Foot Pain       Brief Narrative   82 year old female with history of A. fib on anticoagulation, hypertension, Churg-Strauss syndrome, combined systolic and diastolic CHF, diabetes mellitus presented with diabetic foot infection. Patient injured her right foot about 2 weeks prior to admission from the metal frame on the bottom of the walker. Patient was tachycardic with low-grade fever and elevated lactic acid of 3 on presentation and started on empiric antibiotics. Blood cultures grew MSSA and antibiotic was narrowed to IV cefazolin. MRI of the right foot was concerning for osteomyelitis. Vascular surgery was consulted for nonhealing wound in her right foot with decreased ABIs. Patient underwent lower extremity angiogram showing occlusion of all 3 tibial vessels with recanalization of occluded posterior tibial artery and subsequent balloon angioplasty.   Subjective:   Patient returned from lower extremity angiogram. Reports some pain in the right leg. Went into rapid A. fib transiently with heart rate in the 130s-140s.   Assessment  & Plan :    Sepsis With MSSA bacteremia On empiric cefazolin. Source is likely foot infection. Repeat blood culture still positive. (From 6/11). Repeat sent again today. 2-D echo done on 6/13 shows no vegetation. ID recommends TEE. requested cardiology  for TEE. Not yet  on schedule for tomorrow yet .Will keep her NPO after midnight.   Peripheral vascular disease with ischemic foot changes -Lower extremity angiogram with recanalization of occluded posterior tibial artery. Tolerated procedure well. -Wound care following for decubitus heel ulcer. -MRI foot erosive lesion medial head first metatarsal with low-level edema and enhancement; diffuse soft tissue edema of the ankle and foot. Suspect osteomyelitis.  diabetes mellitus type 2 with renal manifestations -Continue NovoLog sliding scale -01/27/2016 hemoglobin A1c 7.9 -hold home  metformin  Chronic systolic CHF/NICM -09/21/2014 echo EF 20-25%, severe diffuse HK, severe TR, severe RV dyfx -Continue furosemide , ACEi ,BB and aldactone   Chronic Atrial Fibrillation -CHADS2Vasc of 6,on xarelto -On 01/29/16-Developed RVR (metoprolol was held to soft blood pressure the previous day.). Required Cardizem drip briefly. . Again went into rapid A. fib this afternoon. Ordered 10 mg IV Cardizem 1 and increased metoprolol dose to 50 mg twice a day. -Continue heparin bridge. Resume Xarelto from tomorrow.      Code Status :Full code  Family Communication  : Daughter at bedside  Disposition Plan  : May need SNF. PT evaluation pending. Awaiting TEE could be discharged as early as 6/16.  Barriers For Discharge : Positive blood culture. PT evaluation. TEE  Consults  :  ID Vascular surgery  Procedures  :  MRI right foot 2-D echo Lower extremity angiogram  DVT Prophylaxis  : IV heparin/Xarelto  Lab Results  Component Value Date   PLT 337 01/30/2016    Antibiotics  :    Anti-infectives    Start     Dose/Rate Route Frequency Ordered Stop   01/27/16 1700  vancomycin (VANCOCIN) IVPB 750 mg/150 ml premix  Status:  Discontinued     750 mg 150 mL/hr over 60 Minutes Intravenous Every 24 hours 01/26/16 1659 01/27/16 1253   01/27/16 1400  ceFAZolin (ANCEF) IVPB 1 g/50 mL premix     1  g 100 mL/hr over 30 Minutes Intravenous Every 8 hours 01/27/16 1333     01/26/16 1700  piperacillin-tazobactam (ZOSYN) IVPB 3.375 g  Status:  Discontinued     3.375 g 100 mL/hr over 30 Minutes Intravenous  Once 01/26/16 1655 01/26/16 1658   01/26/16 1700  vancomycin (VANCOCIN) IVPB 1000 mg/200 mL premix     1,000 mg 200 mL/hr over 60 Minutes Intravenous  Once 01/26/16 1655 01/26/16 1931   01/26/16 1700  piperacillin-tazobactam (ZOSYN) IVPB 3.375 g  Status:  Discontinued     3.375 g 12.5 mL/hr over 240 Minutes Intravenous Every 8 hours 01/26/16 1658 01/27/16 1333        Objective:   Filed Vitals:   01/30/16 1437 01/30/16 1445 01/30/16 1500 01/30/16 1515  BP: 146/116 125/78 129/77 125/76  Pulse: 107 95 82 54  Temp:      TempSrc:      Resp: 20     Weight:      SpO2: 99% 100% 94% 100%    Wt Readings from Last 3 Encounters:  01/29/16 64.5 kg (142 lb 3.2 oz)  01/22/16 67.813 kg (149 lb 8 oz)  09/14/15 73.664 kg (162 lb 6.4 oz)     Intake/Output Summary (Last 24 hours) at 01/30/16 1725 Last data filed at 01/30/16 1400  Gross per 24 hour  Intake 895.96 ml  Output    225 ml  Net 670.96 ml     Physical Exam  Gen: not in distress HEENT:  moist mucosa, supple neck Chest: clear b/l, no added sounds CVS: S1 and S2 irregularly irregular, no murmurs, rubs or gallop GI: soft, NT, ND, BS+ Musculoskeletal: warm, no edema, dressing over rt leg, CNS: AAOX2, non focal    Data Review:    CBC  Recent Labs Lab 01/26/16 1609 01/27/16 0452 01/28/16 0416 01/29/16 0331 01/30/16 0608  WBC 6.7 8.3 9.6 7.6 7.6  HGB 13.6 12.5 12.5 11.3* 12.1  HCT 42.3 40.5 40.2 35.4* 38.8  PLT 363 303 287 323 337  MCV 94.2 96.4 95.9 92.7 94.6  MCH 30.3 29.8 29.8 29.6 29.5  MCHC 32.2 30.9 31.1 31.9 31.2  RDW 14.8 15.1 14.7 14.6 14.6  LYMPHSABS 0.9  --   --   --   --   MONOABS 0.8  --   --   --   --   EOSABS 0.1  --   --   --   --   BASOSABS 0.0  --   --   --   --     Chemistries    Recent Labs Lab 01/26/16 1458 01/26/16 1609 01/27/16 0452 01/28/16 0416 01/29/16 0331  NA 138 137 138 136  --   K 4.1 4.2 3.7 3.5  --   CL 95* 97* 98* 96*  --   CO2  --  30 29 30   --   GLUCOSE 174* 160* 124* 123*  --   BUN 20 17 16 13   --   CREATININE 1.10* 1.22* 1.16* 0.95  --   CALCIUM  --  8.3* 8.0* 7.5*  --   MG  --   --   --   --  1.7  AST  --  17  --   --   --   ALT  --  7*  --   --   --   ALKPHOS  --  122  --   --   --   BILITOT  --  1.4*  --   --   --    ------------------------------------------------------------------------------------------------------------------ No results for input(s): CHOL, HDL, LDLCALC, TRIG, CHOLHDL, LDLDIRECT in the last 72 hours.  Lab Results  Component Value Date   HGBA1C 7.9* 01/27/2016   ------------------------------------------------------------------------------------------------------------------ No results for input(s): TSH, T4TOTAL, T3FREE, THYROIDAB in the last 72 hours.  Invalid input(s): FREET3 ------------------------------------------------------------------------------------------------------------------ No results for input(s): VITAMINB12, FOLATE, FERRITIN, TIBC, IRON, RETICCTPCT in the last 72 hours.  Coagulation profile No results for input(s): INR, PROTIME in the last 168 hours.  No results for input(s): DDIMER in the last 72 hours.  Cardiac Enzymes No results for input(s): CKMB, TROPONINI, MYOGLOBIN in the last 168 hours.  Invalid input(s): CK ------------------------------------------------------------------------------------------------------------------    Component Value Date/Time   BNP 805.5* 01/22/2016 1616   BNP 1069.1* 08/01/2014 1635    Inpatient Medications  Scheduled Meds: . brimonidine  1 drop Left Eye Q12H  .  ceFAZolin (ANCEF) IV  1 g Intravenous Q8H  . diltiazem  10 mg Intravenous Once  . dorzolamide  1 drop Left Eye BID  . famotidine  20 mg Oral Daily  . ferrous sulfate  325 mg Oral  Q breakfast  . furosemide  40 mg Oral QPM  . furosemide  80 mg Oral Daily  . insulin aspart  0-15 Units Subcutaneous TID WC  . metoprolol tartrate  50 mg Oral BID  . mometasone-formoterol  2 puff Inhalation BID  . multivitamin with minerals  1 tablet Oral Daily  . potassium chloride SA  40 mEq Oral Daily  . spironolactone  25 mg Oral Daily   Continuous Infusions: . sodium chloride 50 mL/hr at 01/30/16 1153  . diltiazem (CARDIZEM) infusion Stopped (01/30/16 1315)  . heparin     PRN Meds:.acetaminophen **OR** acetaminophen, albuterol, HYDROmorphone (DILAUDID) injection, ondansetron **OR** ondansetron (ZOFRAN) IV, polyethylene glycol  Micro Results Recent Results (from the past 240 hour(s))  Blood Culture (routine x 2)     Status: Abnormal   Collection Time: 01/26/16  5:58 PM  Result Value Ref Range Status   Specimen Description BLOOD WRIST LEFT  Final   Special Requests BOTTLES DRAWN AEROBIC ONLY 6CC  Final   Culture  Setup Time   Final    GRAM POSITIVE COCCI IN CLUSTERS AEROBIC BOTTLE ONLY CRITICAL RESULT CALLED TO, READ BACK BY AND VERIFIED WITH: R RUMBARGER,PHARMD AT 0957 01/27/16    Culture STAPHYLOCOCCUS AUREUS (A)  Final   Report Status 01/29/2016 FINAL  Final   Organism ID, Bacteria STAPHYLOCOCCUS AUREUS  Final      Susceptibility   Staphylococcus aureus - MIC*    CIPROFLOXACIN <=0.5 SENSITIVE Sensitive     ERYTHROMYCIN <=0.25 SENSITIVE Sensitive     GENTAMICIN <=0.5 SENSITIVE Sensitive     OXACILLIN 0.5 SENSITIVE Sensitive     TETRACYCLINE <=1 SENSITIVE Sensitive     VANCOMYCIN <=0.5 SENSITIVE Sensitive     TRIMETH/SULFA <=10 SENSITIVE Sensitive     CLINDAMYCIN <=0.25 SENSITIVE Sensitive     RIFAMPIN <=0.5 SENSITIVE Sensitive     Inducible Clindamycin NEGATIVE Sensitive     * STAPHYLOCOCCUS AUREUS  Blood Culture ID Panel (Reflexed)     Status: Abnormal   Collection Time: 01/26/16  5:58 PM  Result Value Ref Range Status   Enterococcus species NOT DETECTED NOT  DETECTED Final   Vancomycin resistance NOT DETECTED NOT DETECTED Final   Listeria monocytogenes NOT DETECTED NOT DETECTED Final   Staphylococcus species DETECTED (A) NOT DETECTED Final    Comment: CRITICAL RESULT CALLED TO, READ BACK BY AND VERIFIED WITH: R RUMBARGER,PHARMD AT 0957 01/27/16    Staphylococcus aureus DETECTED (A) NOT DETECTED Final    Comment: CRITICAL RESULT CALLED TO, READ BACK BY AND VERIFIED WITH: R RUMBARGER,PHARMD AT 0957 01/27/16    Methicillin resistance NOT DETECTED NOT DETECTED Final   Streptococcus species NOT DETECTED NOT DETECTED Final   Streptococcus agalactiae NOT DETECTED NOT DETECTED Final   Streptococcus pneumoniae NOT DETECTED NOT DETECTED Final   Streptococcus pyogenes NOT DETECTED NOT DETECTED Final   Acinetobacter baumannii NOT DETECTED NOT DETECTED Final   Enterobacteriaceae species NOT DETECTED NOT DETECTED Final   Enterobacter cloacae complex NOT DETECTED NOT DETECTED Final   Escherichia coli NOT DETECTED NOT DETECTED Final   Klebsiella oxytoca NOT DETECTED NOT DETECTED Final   Klebsiella pneumoniae NOT DETECTED NOT DETECTED Final   Proteus species NOT DETECTED NOT DETECTED Final   Serratia marcescens NOT DETECTED NOT DETECTED Final   Carbapenem resistance NOT DETECTED NOT DETECTED Final   Haemophilus influenzae NOT DETECTED NOT DETECTED Final   Neisseria meningitidis NOT DETECTED NOT DETECTED Final   Pseudomonas aeruginosa NOT DETECTED NOT DETECTED Final   Candida albicans NOT DETECTED NOT DETECTED Final   Candida glabrata NOT DETECTED NOT DETECTED Final   Candida krusei NOT DETECTED NOT DETECTED Final   Candida parapsilosis NOT DETECTED NOT DETECTED Final   Candida tropicalis NOT DETECTED NOT DETECTED Final  Blood Culture (routine x 2)     Status: Abnormal   Collection Time: 01/26/16  6:01 PM  Result Value Ref Range Status   Specimen Description BLOOD RIGHT HAND  Final   Special Requests BOTTLES DRAWN AEROBIC AND ANAEROBIC 5CC  Final    Culture  Setup Time   Final    GRAM POSITIVE COCCI IN CLUSTERS IN BOTH AEROBIC AND ANAEROBIC BOTTLES CRITICAL RESULT CALLED TO, READ BACK BY AND VERIFIED WITH: R RUMBARGER,PHARMD AT 0957 01/27/16    Culture (A)  Final    STAPHYLOCOCCUS AUREUS SUSCEPTIBILITIES PERFORMED ON PREVIOUS CULTURE WITHIN THE LAST 5 DAYS.    Report Status 01/29/2016 FINAL  Final  Urine culture     Status: Abnormal   Collection Time: 01/27/16  1:15 AM  Result Value Ref Range Status   Specimen Description URINE, RANDOM  Final   Special Requests NONE  Final   Culture MULTIPLE SPECIES PRESENT, SUGGEST RECOLLECTION (A)  Final   Report Status 01/28/2016 FINAL  Final  Blood Cultures x 2 sites     Status: None (Preliminary result)   Collection Time: 01/27/16  4:04 PM  Result Value Ref Range Status   Specimen Description BLOOD RIGHT ANTECUBITAL  Final   Special Requests BOTTLES DRAWN AEROBIC ONLY 5CC  Final   Culture NO GROWTH 3 DAYS  Final   Report Status PENDING  Incomplete  Blood Cultures x 2 sites     Status: Abnormal   Collection Time: 01/27/16  4:15 PM  Result Value Ref Range Status   Specimen Description BLOOD RIGHT WRIST  Final   Special Requests IN PEDIATRIC BOTTLE 2CC  Final   Culture  Setup Time   Final    GRAM POSITIVE COCCI IN CLUSTERS IN PEDIATRIC BOTTLE CRITICAL RESULT CALLED TO, READ BACK BY AND VERIFIED WITH: Celedonio Miyamoto  [id number] AT 0846 S.YARBROUGH    Culture (A)  Final    STAPHYLOCOCCUS AUREUS SUSCEPTIBILITIES PERFORMED ON PREVIOUS CULTURE WITHIN THE LAST 5 DAYS.    Report Status 01/30/2016 FINAL  Final  Culture, blood (routine x 2)     Status: None (Preliminary result)   Collection Time: 01/30/16  6:25 AM  Result Value Ref Range Status   Specimen Description BLOOD RIGHT FOREARM  Final   Special Requests IN PEDIATRIC BOTTLE  [facility]  Final   Culture PENDING  Incomplete   Report Status PENDING  Incomplete    Radiology Reports Dg Chest 2 View  01/26/2016  CLINICAL DATA:  Sepsis. EXAM: CHEST   2 VIEW COMPARISON:  Chest radiographs dated 11/24/2014 and chest CT dated 05/03/2015. FINDINGS: Stable enlarged cardiac silhouette and left basilar scarring. The trachea remains deviated to the right at the thoracic inlet. Diffuse osteopenia. IMPRESSION: 1. No acute abnormality. 2. Stable cardiomegaly and left basilar scarring. 3. Stable tracheal deviation to the right at the level of the thoracic inlet due to a previously demonstrated large thyroid nodule. Electronically Signed   By: Beckie Salts M.D.   On: 01/26/2016 19:39   Mr Foot Right W Wo Contrast  01/27/2016  CLINICAL DATA:  Foot pain with draining wounds.  Heel pain. EXAM: MRI OF THE RIGHT FOREFOOT WITHOUT AND WITH CONTRAST TECHNIQUE: Multiplanar, multisequence MR imaging was performed both before and after administration of intravenous contrast. CONTRAST:  7mL MULTIHANCE GADOBENATE DIMEGLUMINE 529 MG/ML IV SOLN COMPARISON:  01/26/2016 FINDINGS: Osteomyelitis protocol MRI of the foot was obtained, to include the entire foot and ankle. This protocol uses a large field of view to cover the entire foot and ankle, and is suitable for assessing bony structures for osteomyelitis. Due to the large field of view and imaging plane choice, this protocol is less sensitive for assessing small structures such as ligamentous structures of the foot and ankle, compared to a dedicated forefoot or dedicated hindfoot exam. Despite efforts by the technologist and patient, motion artifact is present on today's exam and could not be eliminated. This reduces exam sensitivity and specificity. There is diffuse subcutaneous edema in the ankle, heel, and foot. In the head of the first metatarsal there is an erosive lesion dorsum medially. Reportedly this is in the vicinity of a skin wound, although the wound is poorly  seen on today's MRI. Differential diagnostic considerations include early osteomyelitis versus gout arthropathy. No other osseous edema or significant abnormal  osseous enhancement observed to suggest osteomyelitis elsewhere. Incidental note is made of a degenerative subcortical cyst or geode distally in the middle cuneiform. No malalignment at the Lisfranc joint. Thickened plantar fascia raises possibility plantar fasciitis. No drainable abscess. No significant joint effusion to further suggest septic joint. IMPRESSION: 1. Erosive lesion along the dorsal -medial head of the first metatarsal with associated low-level edema and enhancement. This resembles a gout lesion or erosive arthropathy, but by report there is a draining wound in this vicinity which increases the likelihood that this could represent localized early osteomyelitis. 2. Diffuse soft tissue edema in the ankle and foot, nonspecific for bland edema versus cellulitis. 3. Thickened plantar fascia suggesting plantar fasciitis. 4. This final interpretation correlates with the preliminary report issued by Dr. Cherly Hensen. Electronically Signed   By: Gaylyn Rong M.D.   On: 01/27/2016 13:32   Dg Foot Complete Right  01/26/2016  CLINICAL DATA:  Right foot wounds, diabetes EXAM: RIGHT FOOT COMPLETE - 3+ VIEW COMPARISON:  12/09/2013 FINDINGS: Three views of the right foot submitted. There is diffuse osteopenia. Mild soft tissue swelling adjacent to distal aspect of first metatarsal. No acute fracture or subluxation. Atherosclerotic vascular calcifications are noted. No bone destruction to suggest osteomyelitis. There is plantar spur of calcaneus. Dorsal spurring anterior aspect of the talus. IMPRESSION: No acute fracture or subluxation. Limited study by diffuse osteopenia. Mild soft tissue swelling adjacent to distal aspect of first metatarsal. No evidence of bone destruction to suggest osteomyelitis. Plantar spur of calcaneus. Degenerative changes with dorsal spurring anterior aspect of the talus. Electronically Signed   By: Natasha Mead M.D.   On: 01/26/2016 17:00    Time Spent in minutes  25   Eddie North M.D on 01/30/2016 at 5:25 PM  Between 7am to 7pm - Pager - [phone]  After 7pm go to [email or web address] - password [facility]  [facility] -  Office  [phone]

## 2016-01-30 NOTE — Op Note (Signed)
Patient name: [name] MRN: [id number] DOB: 1926/04/22 Sex: female  01/26/2016 - 01/30/2016 Pre-operative Diagnosis: Right leg ulcer Post-operative diagnosis:  Same Surgeon:  Annamarie Major Procedure Performed:  1.  Ultrasound-guided access, left femoral artery  2.  Abdominal aortogram  3.  Bilateral lower extremity runoff  4.  Additional order catheterization (posterior tibial artery)  5.  Angioplasty, right posterior tibial artery    Indications:  Patient has a nonhealing wound on her right foot.  Ultrasound showed decreased ABIs.  She is here today for further evaluation and possible intervention  Procedure:  The patient was identified in the holding area and taken to room 8.  The patient was then placed supine on the table and prepped and draped in the usual sterile fashion.  A time out was called.  Ultrasound was used to evaluate the left common femoral artery.  It was patent .  A digital ultrasound image was acquired.  A micropuncture needle was used to access the left common femoral artery under ultrasound guidance.  An 018 wire was advanced without resistance and a micropuncture sheath was placed.  The 018 wire was removed and a benson wire was placed.  The micropuncture sheath was exchanged for a 5 french sheath.  An omniflush catheter was advanced over the wire to the level of L-1.  An abdominal angiogram was obtained.  Next, using the omniflush catheter and a benson wire, the aortic bifurcation was crossed and the catheter was placed into theright external iliac artery and right runoff was obtained.  left runoff was performed via retrograde sheath injections.  Findings:   Aortogram:  No significant renal artery stenosis.  The infrarenal abdominal aorta is calcified without significant stenosis.  Bilateral common and external iliac arteries are widely patent  Right Lower Extremity:  Right common femoral profunda femoris superficial femoral artery are widely patent.  The popliteal  artery is patent throughout its course.  The patient has occlusion of all 3 tibial vessels.  There is reconstitution of a peroneal and posterior tibial artery at the ankle  Left Lower Extremity:  Left common femoral profunda femoral and superficial femoral artery are patent throughout their course.  There calcified.  There is mild stenosis within the distal superficial femoral and proximal popliteal artery.  There is single vessel runoff via ferry disease and calcified anterior tibial artery  Intervention:  After the above images were acquired the decision was made to proceed with intervention.  Over a Rosen wire a 6 French 52 cm Ansell sheath was advanced into the right superficial femoral artery.  The patient was fully heparinized.  Using a CSI 014 angle catheter and a Halberd wire were used to navigate through the posterior tibial occlusion.  I then switched out for a Gladeus wire which was able to complete crossing of the occlusion.  I then selected a coyote 3 x 1 50 balloon and advanced it down to the distal posterior tibial artery and perform balloon angioplasty of the entire posterior tibial artery taking the balloon to 10 atm for 2 minutes with 2 separate inflations.  Follow-up imaging showed excellent result down to the ankle.  There was residual stenosis at the level of the ankle.  I tried to cross this multiple times however there was contrast extravasation  Impression:  #1  successful recanalization of an occluded posterior tibial artery and subsequent balloon angioplasty using a 3 mm balloon  #2  residual stenosis about onto the foot and at the level of  the ankle.  This was unable to be crossed.  #3  single vessel runoff on the right leg via a very diseased anterior tibial artery    V. Annamarie Major, M.D. [facility] [place] [facility] Office: [phone] Pager:  [phone]

## 2016-01-30 NOTE — Progress Notes (Addendum)
ANTICOAGULATION CONSULT NOTE - Follow Up Consult  Pharmacy Consult for Heparin (while Xarelto on hold) Indication: atrial fibrillation  Allergies  Allergen Reactions  . Amiodarone Other (See Comments)    Contributed to hypoxia    Vital Signs: Temp: 98.6 F (37 C) (06/14 0700) Temp Source: Oral (06/14 0700) BP: 130/83 mmHg (06/14 0700) Pulse Rate: 86 (06/14 0700)  Labs:  Recent Labs  01/28/16 0416  01/29/16 0331 01/29/16 1304 01/29/16 2230 01/30/16 0608  HGB 12.5  --  11.3*  --   --  12.1  HCT 40.2  --  35.4*  --   --  38.8  PLT 287  --  323  --   --  337  APTT  --   < > 43* 47* 94* 65*  HEPARINUNFRC  --   --  1.20*  --  0.79* 0.84*  CREATININE 0.95  --   --   --   --   --   < > = values in this interval not displayed.  Assessment: Heparin for afib while Xarelto on hold for procedure, using aPTTs to dose heparin for now given Xarelto influence on anti-Xa levels (current anti-Xa is 0.84).  APTT this AM slightly subtherapeutic (65). CBC wnl, no bleed documented. Spoke w/ RN, heparin was turned off for angiogram. Now to resume 6h post-sheath removal.  Sheath documented as removed at 1346.  Goal of Therapy:  Heparin level 0.3-0.7 units/ml aPTT 66-102 seconds Monitor platelets by anticoagulation protocol: Yes   Plan:  - Resume heparin at 1200 units/h 6h post-sheath removal - 8h aPTT, daily HL/aPTT/CBC - Monitor s/sx bleeding - F/U resuming Xarelto at 15 mg dose as appropriate   Babs Bertin, PharmD, BCPS Clinical Pharmacist Pager [phone] 01/30/2016 8:30 AM

## 2016-01-30 NOTE — Progress Notes (Signed)
Off Cardizem drip. HR stable in atrial fib rate 78-92.

## 2016-01-31 ENCOUNTER — Other Ambulatory Visit: Payer: Self-pay | Admitting: *Deleted

## 2016-01-31 ENCOUNTER — Encounter (HOSPITAL_COMMUNITY): Payer: Self-pay | Admitting: Surgery

## 2016-01-31 DIAGNOSIS — T814XXA Infection following a procedure, initial encounter: Secondary | ICD-10-CM

## 2016-01-31 DIAGNOSIS — Y838 Other surgical procedures as the cause of abnormal reaction of the patient, or of later complication, without mention of misadventure at the time of the procedure: Secondary | ICD-10-CM

## 2016-01-31 DIAGNOSIS — Z9862 Peripheral vascular angioplasty status: Secondary | ICD-10-CM

## 2016-01-31 DIAGNOSIS — L0889 Other specified local infections of the skin and subcutaneous tissue: Secondary | ICD-10-CM

## 2016-01-31 DIAGNOSIS — A4901 Methicillin susceptible Staphylococcus aureus infection, unspecified site: Secondary | ICD-10-CM | POA: Diagnosis present

## 2016-01-31 DIAGNOSIS — I739 Peripheral vascular disease, unspecified: Secondary | ICD-10-CM

## 2016-01-31 DIAGNOSIS — M86171 Other acute osteomyelitis, right ankle and foot: Secondary | ICD-10-CM | POA: Diagnosis present

## 2016-01-31 LAB — APTT: aPTT: 71 seconds — ABNORMAL HIGH (ref 24–37)

## 2016-01-31 LAB — CBC
HCT: 38.2 % (ref 36.0–46.0)
Hemoglobin: 11.6 g/dL — ABNORMAL LOW (ref 12.0–15.0)
MCH: 28.6 pg (ref 26.0–34.0)
MCHC: 30.4 g/dL (ref 30.0–36.0)
MCV: 94.1 fL (ref 78.0–100.0)
PLATELETS: 328 10*3/uL (ref 150–400)
RBC: 4.06 MIL/uL (ref 3.87–5.11)
RDW: 14.6 % (ref 11.5–15.5)
WBC: 5.9 10*3/uL (ref 4.0–10.5)

## 2016-01-31 LAB — GLUCOSE, CAPILLARY
GLUCOSE-CAPILLARY: 172 mg/dL — AB (ref 65–99)
GLUCOSE-CAPILLARY: 133 mg/dL — AB (ref 65–99)
GLUCOSE-CAPILLARY: 112 mg/dL — AB (ref 65–99)
Glucose-Capillary: 132 mg/dL — ABNORMAL HIGH (ref 65–99)

## 2016-01-31 LAB — HEPARIN LEVEL (UNFRACTIONATED): HEPARIN UNFRACTIONATED: 0.53 [IU]/mL (ref 0.30–0.70)

## 2016-01-31 MED ORDER — ONDANSETRON HCL 4 MG/2ML IJ SOLN: 4.0000 mg | Freq: Four times a day (QID) | INTRAMUSCULAR | Status: DC | PRN

## 2016-01-31 MED ORDER — ACETAMINOPHEN 650 MG RE SUPP: 325.0000 mg | RECTAL | Status: DC | PRN

## 2016-01-31 MED ORDER — ACETAMINOPHEN 325 MG PO TABS
325.0000 mg | ORAL_TABLET | ORAL | Status: DC | PRN
  Administered 2016-01-31 – 2016-02-02 (×3): 650 mg via ORAL
  Filled 2016-01-31 (×3): qty 2

## 2016-01-31 MED ORDER — DOCUSATE SODIUM 100 MG PO CAPS
100.0000 mg | ORAL_CAPSULE | Freq: Every day | ORAL | Status: DC
  Administered 2016-02-01 – 2016-02-03 (×3): 100 mg via ORAL
  Filled 2016-01-31 (×4): qty 1

## 2016-01-31 MED ORDER — ALUM & MAG HYDROXIDE-SIMETH 200-200-20 MG/5ML PO SUSP: 15.0000 mL | ORAL | Status: DC | PRN

## 2016-01-31 MED ORDER — LABETALOL HCL 5 MG/ML IV SOLN: 10.0000 mg | INTRAVENOUS | Status: DC | PRN

## 2016-01-31 MED ORDER — PHENOL 1.4 % MT LIQD: 1.0000 | OROMUCOSAL | Status: DC | PRN

## 2016-01-31 MED ORDER — GUAIFENESIN-DM 100-10 MG/5ML PO SYRP: 15.0000 mL | ORAL_SOLUTION | ORAL | Status: DC | PRN

## 2016-01-31 MED ORDER — METOPROLOL TARTRATE 5 MG/5ML IV SOLN: 2.0000 mg | INTRAVENOUS | Status: DC | PRN

## 2016-01-31 MED ORDER — SODIUM CHLORIDE 0.9 % IV SOLN
INTRAVENOUS | Status: DC
Start: 2016-02-01 — End: 2016-02-01

## 2016-01-31 MED ORDER — SODIUM CHLORIDE 0.9 % IV SOLN: INTRAVENOUS | Status: DC

## 2016-01-31 MED ORDER — HYDRALAZINE HCL 20 MG/ML IJ SOLN: 5.0000 mg | INTRAMUSCULAR | Status: DC | PRN

## 2016-01-31 MED ORDER — RIVAROXABAN 15 MG PO TABS
15.0000 mg | ORAL_TABLET | Freq: Every day | ORAL | Status: DC
  Administered 2016-01-31 – 2016-02-03 (×4): 15 mg via ORAL
  Filled 2016-01-31 (×4): qty 1

## 2016-01-31 MED FILL — Labetalol HCl IV Soln 5 MG/ML: INTRAVENOUS | Qty: 4 | Status: AC

## 2016-01-31 MED FILL — Diphenhydramine HCl Inj 50 MG/ML: INTRAMUSCULAR | Qty: 1 | Status: AC

## 2016-01-31 NOTE — Progress Notes (Signed)
  [facility] Progress Note  Subjective    Sleepy this morning.   Objective Filed Vitals:   01/31/16 0425 01/31/16 0900  BP: 136/93 122/78  Pulse: 114 94  Temp: 98 F (36.7 C) 98 F (36.7 C)  Resp: 16 16    Intake/Output Summary (Last 24 hours) at 01/31/16 1017 Last data filed at 01/31/16 0900  Gross per 24 hour  Intake 365.83 ml  Output    150 ml  Net 215.83 ml   Left groin sheath site soft without hematoma. Right foot dressing clean.  Assessment/Planning: 82 y.o. female is s/p: abdominal aortogram with bilateral lower extremity runoff, angioplasty right posterior tibial artery  1 Day Post-Op   On the right, patient had occlusion of all three tibial vessels with reconstitution of a peroneal and posterior tibial artery at the ankle. There was residual stenosis at the level of the ankle that was unable to be crossed with a wire.  No further attempts at percutaneous intervention at this point. Will have patient f/u up in 4 weeks with right lower extremity arterial duplex and to evaluate right leg ulcer.    Raymond Gurney 01/31/2016 10:17 AM --  Laboratory CBC    Component Value Date/Time   WBC 5.9 01/31/2016 0524   HGB 11.6* 01/31/2016 0524   HCT 38.2 01/31/2016 0524   PLT 328 01/31/2016 0524    BMET    Component Value Date/Time   NA 136 01/28/2016 0416   K 3.5 01/28/2016 0416   CL 96* 01/28/2016 0416   CO2 30 01/28/2016 0416   GLUCOSE 123* 01/28/2016 0416   BUN 13 01/28/2016 0416   CREATININE 0.95 01/28/2016 0416   CREATININE 0.66 11/01/2014 1605   CALCIUM 7.5* 01/28/2016 0416   GFRNONAA 52* 01/28/2016 0416   GFRNONAA 80 11/01/2014 1605   GFRAA >60 01/28/2016 0416   GFRAA >89 11/01/2014 1605    COAG Lab Results  Component Value Date   INR 1.22 08/18/2013   INR 1.34 07/02/2013   INR 1.38 06/29/2013   No results found for: PTT  Antibiotics Anti-infectives    Start     Dose/Rate Route Frequency Ordered Stop   01/27/16  1700  vancomycin (VANCOCIN) IVPB 750 mg/150 ml premix  Status:  Discontinued     750 mg 150 mL/hr over 60 Minutes Intravenous Every 24 hours 01/26/16 1659 01/27/16 1253   01/27/16 1400  ceFAZolin (ANCEF) IVPB 1 g/50 mL premix     1 g 100 mL/hr over 30 Minutes Intravenous Every 8 hours 01/27/16 1333     01/26/16 1700  piperacillin-tazobactam (ZOSYN) IVPB 3.375 g  Status:  Discontinued     3.375 g 100 mL/hr over 30 Minutes Intravenous  Once 01/26/16 1655 01/26/16 1658   01/26/16 1700  vancomycin (VANCOCIN) IVPB 1000 mg/200 mL premix     1,000 mg 200 mL/hr over 60 Minutes Intravenous  Once 01/26/16 1655 01/26/16 1931   01/26/16 1700  piperacillin-tazobactam (ZOSYN) IVPB 3.375 g  Status:  Discontinued     3.375 g 12.5 mL/hr over 240 Minutes Intravenous Every 8 hours 01/26/16 1658 01/27/16 1333       Maris Berger, PA-C [facility] Office: [phone] Pager: [phone] 01/31/2016 10:17 AM

## 2016-01-31 NOTE — Care Management Important Message (Signed)
Important Message  Patient Details  Name: Kerlyn Tidrick MRN: [id number] Date of Birth: 08-23-1925   Medicare Important Message Given:  Yes    Bernadette Hoit 01/31/2016, 10:01 AM

## 2016-01-31 NOTE — Progress Notes (Addendum)
    [facility] has been requested to perform a transesophageal echocardiogram on [name] to rule out endocarditis.  After careful review of history and examination, the risks and benefits of transesophageal echocardiogram have been explained including risks of esophageal damage, perforation (1:10,000 risk), bleeding, pharyngeal hematoma as well as other potential complications associated with conscious sedation including aspiration, arrhythmia, respiratory failure and death. Alternatives to treatment were discussed. She verbalized understanding of the above but I cannot be sure of her total consentability as she could not tell me what the date was (knew it was summer, knew where she was). She gave [place] permission to speak with her son to obtain consent. I called his phone but got his voicemail. I left a message for him to call the unit to discuss the procedure. I told the nurse we would be waiting for that call back.  I preliminarily wrote orders as she is on the schedule for tomorrow at 2pm. As above, will need to obtain consent before formally proceeding.  Laurann Montana, PA-C 01/31/2016 10:37 AM   Addendum: the patient's family will be arriving at 5. Signed out to [employer] to f/u.  Burlon Centrella PA-C

## 2016-01-31 NOTE — Progress Notes (Signed)
PROGRESS NOTE                                                                                                                                                                                                             Patient Demographics:    [name], is a 82 y.o. female, DOB - 29-Nov-1925, YQM:[id number]  Admit date - 01/26/2016   Admitting Physician Haydee Salter, MD  Outpatient Primary MD for the patient is Gust Rung, DO  LOS - 4  Outpatient Specialists: Heart failure clinic  Dr Imogene Burn  Chief Complaint  Patient presents with  . Foot Pain       Brief Narrative   82 year old female with history of A. fib on anticoagulation, hypertension, Churg-Strauss syndrome, combined systolic and diastolic CHF, diabetes mellitus presented with diabetic foot infection. Patient injured her right foot about 2 weeks prior to admission from the metal frame on the bottom of the walker. Patient was tachycardic with low-grade fever and elevated lactic acid of 3 on presentation and started on empiric antibiotics. Blood cultures grew MSSA and antibiotic was narrowed to IV cefazolin. MRI of the right foot was concerning for osteomyelitis. Vascular surgery was consulted for nonhealing wound in her right foot with decreased ABIs. Patient underwent lower extremity angiogram showing occlusion of all 3 tibial vessels with recanalization of occluded posterior tibial artery and subsequent balloon angioplasty.   Subjective:   Heart rate has been stable after metoprolol dose adjusted. Denies any symptoms.   Assessment  & Plan :    Sepsis With MSSA bacteremia On empiric cefazolin. Source is likely foot infection. Repeat blood culture still positive. (From 6/11). Repeat sent On 6/14 without any growth so far. 2-D echo done on 6/13 shows no vegetation. TEE scheduled for 6/15. Nothing by mouth after midnight.    Peripheral vascular disease  with ischemic foot changes -Lower extremity angiogram with recanalization of occluded posterior tibial artery. Tolerated procedure well. -Wound care following for decubitus heel ulcer. -MRI foot shows erosive lesion medial head first metatarsal with low-level edema and enhancement; diffuse soft tissue edema of the ankle and foot. Suspect osteomyelitis.  diabetes mellitus type 2 with renal manifestations -Continue NovoLog sliding scale -01/27/2016 hemoglobin A1c 7.9 -Holding home metformin  Chronic systolic CHF/NICM -09/21/2014 echo EF 20-25%, severe diffuse HK, severe TR, severe RV dyfx -Continue  furosemide , ACEi ,BB and aldactone   Chronic Atrial Fibrillation -CHADS2Vasc of 6,on xarelto -On 01/29/16-Developed RVR (metoprolol was held to soft blood pressure the previous day.). Required Cardizem drip briefly. . Now stable after metoprolol dose increased. -Resume Xarelto.      Code Status :Full code  Family Communication  : None at bedside today  Disposition Plan  : May need SNF. PT evaluation pending. Awaiting TEE on 6/15 . Depending upon TEE results can be discharged as early as 6/16  Barriers For Discharge : Positive blood culture. PT evaluation. TEE  Consults  :  ID Vascular surgery Cardiology for TEE  Procedures  :  MRI right foot 2-D echo Lower extremity angiogram  DVT Prophylaxis  : Xarelto  Lab Results  Component Value Date   PLT 328 01/31/2016    Antibiotics  :    Anti-infectives    Start     Dose/Rate Route Frequency Ordered Stop   01/27/16 1700  vancomycin (VANCOCIN) IVPB 750 mg/150 ml premix  Status:  Discontinued     750 mg 150 mL/hr over 60 Minutes Intravenous Every 24 hours 01/26/16 1659 01/27/16 1253   01/27/16 1400  ceFAZolin (ANCEF) IVPB 1 g/50 mL premix     1 g 100 mL/hr over 30 Minutes Intravenous Every 8 hours 01/27/16 1333     01/26/16 1700  piperacillin-tazobactam (ZOSYN) IVPB 3.375 g  Status:  Discontinued     3.375 g 100 mL/hr over  30 Minutes Intravenous  Once 01/26/16 1655 01/26/16 1658   01/26/16 1700  vancomycin (VANCOCIN) IVPB 1000 mg/200 mL premix     1,000 mg 200 mL/hr over 60 Minutes Intravenous  Once 01/26/16 1655 01/26/16 1931   01/26/16 1700  piperacillin-tazobactam (ZOSYN) IVPB 3.375 g  Status:  Discontinued     3.375 g 12.5 mL/hr over 240 Minutes Intravenous Every 8 hours 01/26/16 1658 01/27/16 1333        Objective:   Filed Vitals:   01/30/16 1957 01/30/16 2222 01/31/16 0425 01/31/16 0900  BP:  108/60 136/93 122/78  Pulse:  91 114 94  Temp:  98 F (36.7 C) 98 F (36.7 C) 98 F (36.7 C)  TempSrc:  Oral Axillary Oral  Resp:  18 16 16   Weight:   60.4 kg (133 lb 2.5 oz)   SpO2: 98% 99% 98% 98%    Wt Readings from Last 3 Encounters:  01/31/16 60.4 kg (133 lb 2.5 oz)  01/22/16 67.813 kg (149 lb 8 oz)  09/14/15 73.664 kg (162 lb 6.4 oz)     Intake/Output Summary (Last 24 hours) at 01/31/16 1420 Last data filed at 01/31/16 1300  Gross per 24 hour  Intake    500 ml  Output      0 ml  Net    500 ml     Physical Exam  Gen: not in distress HEENT:  moist mucosa, supple neck Chest: clear b/l, no added sounds CVS: S1 and S2 irregularly irregular, no murmurs, rubs or gallop GI: soft, NT, ND, BS+ Musculoskeletal: warm, no edema, dressing over rt leg, CNS: AAOX2, non focal    Data Review:    CBC  Recent Labs Lab 01/26/16 1609 01/27/16 0452 01/28/16 0416 01/29/16 0331 01/30/16 0608 01/31/16 0524  WBC 6.7 8.3 9.6 7.6 7.6 5.9  HGB 13.6 12.5 12.5 11.3* 12.1 11.6*  HCT 42.3 40.5 40.2 35.4* 38.8 38.2  PLT 363 303 287 323 337 328  MCV 94.2 96.4 95.9 92.7 94.6 94.1  MCH 30.3  29.8 29.8 29.6 29.5 28.6  MCHC 32.2 30.9 31.1 31.9 31.2 30.4  RDW 14.8 15.1 14.7 14.6 14.6 14.6  LYMPHSABS 0.9  --   --   --   --   --   MONOABS 0.8  --   --   --   --   --   EOSABS 0.1  --   --   --   --   --   BASOSABS 0.0  --   --   --   --   --     Chemistries   Recent Labs Lab 01/26/16 1458  01/26/16 1609 01/27/16 0452 01/28/16 0416 01/29/16 0331  NA 138 137 138 136  --   K 4.1 4.2 3.7 3.5  --   CL 95* 97* 98* 96*  --   CO2  --  30 29 30   --   GLUCOSE 174* 160* 124* 123*  --   BUN 20 17 16 13   --   CREATININE 1.10* 1.22* 1.16* 0.95  --   CALCIUM  --  8.3* 8.0* 7.5*  --   MG  --   --   --   --  1.7  AST  --  17  --   --   --   ALT  --  7*  --   --   --   ALKPHOS  --  122  --   --   --   BILITOT  --  1.4*  --   --   --    ------------------------------------------------------------------------------------------------------------------ No results for input(s): CHOL, HDL, LDLCALC, TRIG, CHOLHDL, LDLDIRECT in the last 72 hours.  Lab Results  Component Value Date   HGBA1C 7.9* 01/27/2016   ------------------------------------------------------------------------------------------------------------------ No results for input(s): TSH, T4TOTAL, T3FREE, THYROIDAB in the last 72 hours.  Invalid input(s): FREET3 ------------------------------------------------------------------------------------------------------------------ No results for input(s): VITAMINB12, FOLATE, FERRITIN, TIBC, IRON, RETICCTPCT in the last 72 hours.  Coagulation profile No results for input(s): INR, PROTIME in the last 168 hours.  No results for input(s): DDIMER in the last 72 hours.  Cardiac Enzymes No results for input(s): CKMB, TROPONINI, MYOGLOBIN in the last 168 hours.  Invalid input(s): CK ------------------------------------------------------------------------------------------------------------------    Component Value Date/Time   BNP 805.5* 01/22/2016 1616   BNP 1069.1* 08/01/2014 1635    Inpatient Medications  Scheduled Meds: . brimonidine  1 drop Left Eye Q12H  .  ceFAZolin (ANCEF) IV  1 g Intravenous Q8H  . [START ON 02/01/2016] docusate sodium  100 mg Oral Daily  . dorzolamide  1 drop Left Eye BID  . famotidine  20 mg Oral Daily  . ferrous sulfate  325 mg Oral Q breakfast    . furosemide  40 mg Oral QPM  . furosemide  80 mg Oral Daily  . insulin aspart  0-15 Units Subcutaneous TID WC  . metoprolol tartrate  50 mg Oral BID  . mometasone-formoterol  2 puff Inhalation BID  . multivitamin with minerals  1 tablet Oral Daily  . potassium chloride SA  40 mEq Oral Daily  . rivaroxaban  15 mg Oral Daily  . spironolactone  25 mg Oral Daily   Continuous Infusions:   PRN Meds:.acetaminophen **OR** acetaminophen, albuterol, alum & mag hydroxide-simeth, guaiFENesin-dextromethorphan, hydrALAZINE, HYDROmorphone (DILAUDID) injection, labetalol, metoprolol, ondansetron **OR** ondansetron (ZOFRAN) IV, phenol, polyethylene glycol  Micro Results Recent Results (from the past 240 hour(s))  Blood Culture (routine x 2)     Status: Abnormal   Collection Time: 01/26/16  5:58 PM  Result Value Ref Range Status   Specimen Description BLOOD WRIST LEFT  Final   Special Requests BOTTLES DRAWN AEROBIC ONLY 6CC  Final   Culture  Setup Time   Final    GRAM POSITIVE COCCI IN CLUSTERS AEROBIC BOTTLE ONLY CRITICAL RESULT CALLED TO, READ BACK BY AND VERIFIED WITH: R RUMBARGER,PHARMD AT 0957 01/27/16    Culture STAPHYLOCOCCUS AUREUS (A)  Final   Report Status 01/29/2016 FINAL  Final   Organism ID, Bacteria STAPHYLOCOCCUS AUREUS  Final      Susceptibility   Staphylococcus aureus - MIC*    CIPROFLOXACIN <=0.5 SENSITIVE Sensitive     ERYTHROMYCIN <=0.25 SENSITIVE Sensitive     GENTAMICIN <=0.5 SENSITIVE Sensitive     OXACILLIN 0.5 SENSITIVE Sensitive     TETRACYCLINE <=1 SENSITIVE Sensitive     VANCOMYCIN <=0.5 SENSITIVE Sensitive     TRIMETH/SULFA <=10 SENSITIVE Sensitive     CLINDAMYCIN <=0.25 SENSITIVE Sensitive     RIFAMPIN <=0.5 SENSITIVE Sensitive     Inducible Clindamycin NEGATIVE Sensitive     * STAPHYLOCOCCUS AUREUS  Blood Culture ID Panel (Reflexed)     Status: Abnormal   Collection Time: 01/26/16  5:58 PM  Result Value Ref Range Status   Enterococcus species NOT DETECTED  NOT DETECTED Final   Vancomycin resistance NOT DETECTED NOT DETECTED Final   Listeria monocytogenes NOT DETECTED NOT DETECTED Final   Staphylococcus species DETECTED (A) NOT DETECTED Final    Comment: CRITICAL RESULT CALLED TO, READ BACK BY AND VERIFIED WITH: R RUMBARGER,PHARMD AT 0957 01/27/16    Staphylococcus aureus DETECTED (A) NOT DETECTED Final    Comment: CRITICAL RESULT CALLED TO, READ BACK BY AND VERIFIED WITH: R RUMBARGER,PHARMD AT 0957 01/27/16    Methicillin resistance NOT DETECTED NOT DETECTED Final   Streptococcus species NOT DETECTED NOT DETECTED Final   Streptococcus agalactiae NOT DETECTED NOT DETECTED Final   Streptococcus pneumoniae NOT DETECTED NOT DETECTED Final   Streptococcus pyogenes NOT DETECTED NOT DETECTED Final   Acinetobacter baumannii NOT DETECTED NOT DETECTED Final   Enterobacteriaceae species NOT DETECTED NOT DETECTED Final   Enterobacter cloacae complex NOT DETECTED NOT DETECTED Final   Escherichia coli NOT DETECTED NOT DETECTED Final   Klebsiella oxytoca NOT DETECTED NOT DETECTED Final   Klebsiella pneumoniae NOT DETECTED NOT DETECTED Final   Proteus species NOT DETECTED NOT DETECTED Final   Serratia marcescens NOT DETECTED NOT DETECTED Final   Carbapenem resistance NOT DETECTED NOT DETECTED Final   Haemophilus influenzae NOT DETECTED NOT DETECTED Final   Neisseria meningitidis NOT DETECTED NOT DETECTED Final   Pseudomonas aeruginosa NOT DETECTED NOT DETECTED Final   Candida albicans NOT DETECTED NOT DETECTED Final   Candida glabrata NOT DETECTED NOT DETECTED Final   Candida krusei NOT DETECTED NOT DETECTED Final   Candida parapsilosis NOT DETECTED NOT DETECTED Final   Candida tropicalis NOT DETECTED NOT DETECTED Final  Blood Culture (routine x 2)     Status: Abnormal   Collection Time: 01/26/16  6:01 PM  Result Value Ref Range Status   Specimen Description BLOOD RIGHT HAND  Final   Special Requests BOTTLES DRAWN AEROBIC AND ANAEROBIC 5CC  Final    Culture  Setup Time   Final    GRAM POSITIVE COCCI IN CLUSTERS IN BOTH AEROBIC AND ANAEROBIC BOTTLES CRITICAL RESULT CALLED TO, READ BACK BY AND VERIFIED WITH: R RUMBARGER,PHARMD AT 0957 01/27/16    Culture (A)  Final    STAPHYLOCOCCUS AUREUS SUSCEPTIBILITIES PERFORMED ON PREVIOUS CULTURE WITHIN THE  LAST 5 DAYS.    Report Status 01/29/2016 FINAL  Final  Urine culture     Status: Abnormal   Collection Time: 01/27/16  1:15 AM  Result Value Ref Range Status   Specimen Description URINE, RANDOM  Final   Special Requests NONE  Final   Culture MULTIPLE SPECIES PRESENT, SUGGEST RECOLLECTION (A)  Final   Report Status 01/28/2016 FINAL  Final  Blood Cultures x 2 sites     Status: None (Preliminary result)   Collection Time: 01/27/16  4:04 PM  Result Value Ref Range Status   Specimen Description BLOOD RIGHT ANTECUBITAL  Final   Special Requests BOTTLES DRAWN AEROBIC ONLY 5CC  Final   Culture NO GROWTH 3 DAYS  Final   Report Status PENDING  Incomplete  Blood Cultures x 2 sites     Status: Abnormal   Collection Time: 01/27/16  4:15 PM  Result Value Ref Range Status   Specimen Description BLOOD RIGHT WRIST  Final   Special Requests IN PEDIATRIC BOTTLE 2CC  Final   Culture  Setup Time   Final    GRAM POSITIVE COCCI IN CLUSTERS IN PEDIATRIC BOTTLE CRITICAL RESULT CALLED TO, READ BACK BY AND VERIFIED WITH: Celedonio Miyamoto  [id number] AT 0846 S.YARBROUGH    Culture (A)  Final    STAPHYLOCOCCUS AUREUS SUSCEPTIBILITIES PERFORMED ON PREVIOUS CULTURE WITHIN THE LAST 5 DAYS.    Report Status 01/30/2016 FINAL  Final  Culture, blood (routine x 2)     Status: None (Preliminary result)   Collection Time: 01/30/16  6:25 AM  Result Value Ref Range Status   Specimen Description BLOOD RIGHT FOREARM  Final   Special Requests IN PEDIATRIC BOTTLE  [facility]  Final   Culture PENDING  Incomplete   Report Status PENDING  Incomplete    Radiology Reports Dg Chest 2 View  01/26/2016  CLINICAL DATA:  Sepsis. EXAM: CHEST   2 VIEW COMPARISON:  Chest radiographs dated 11/24/2014 and chest CT dated 05/03/2015. FINDINGS: Stable enlarged cardiac silhouette and left basilar scarring. The trachea remains deviated to the right at the thoracic inlet. Diffuse osteopenia. IMPRESSION: 1. No acute abnormality. 2. Stable cardiomegaly and left basilar scarring. 3. Stable tracheal deviation to the right at the level of the thoracic inlet due to a previously demonstrated large thyroid nodule. Electronically Signed   By: Beckie Salts M.D.   On: 01/26/2016 19:39   Mr Foot Right W Wo Contrast  01/27/2016  CLINICAL DATA:  Foot pain with draining wounds.  Heel pain. EXAM: MRI OF THE RIGHT FOREFOOT WITHOUT AND WITH CONTRAST TECHNIQUE: Multiplanar, multisequence MR imaging was performed both before and after administration of intravenous contrast. CONTRAST:  7mL MULTIHANCE GADOBENATE DIMEGLUMINE 529 MG/ML IV SOLN COMPARISON:  01/26/2016 FINDINGS: Osteomyelitis protocol MRI of the foot was obtained, to include the entire foot and ankle. This protocol uses a large field of view to cover the entire foot and ankle, and is suitable for assessing bony structures for osteomyelitis. Due to the large field of view and imaging plane choice, this protocol is less sensitive for assessing small structures such as ligamentous structures of the foot and ankle, compared to a dedicated forefoot or dedicated hindfoot exam. Despite efforts by the technologist and patient, motion artifact is present on today's exam and could not be eliminated. This reduces exam sensitivity and specificity. There is diffuse subcutaneous edema in the ankle, heel, and foot. In the head of the first metatarsal there is an erosive lesion dorsum medially. Reportedly this  is in the vicinity of a skin wound, although the wound is poorly seen on today's MRI. Differential diagnostic considerations include early osteomyelitis versus gout arthropathy. No other osseous edema or significant abnormal  osseous enhancement observed to suggest osteomyelitis elsewhere. Incidental note is made of a degenerative subcortical cyst or geode distally in the middle cuneiform. No malalignment at the Lisfranc joint. Thickened plantar fascia raises possibility plantar fasciitis. No drainable abscess. No significant joint effusion to further suggest septic joint. IMPRESSION: 1. Erosive lesion along the dorsal -medial head of the first metatarsal with associated low-level edema and enhancement. This resembles a gout lesion or erosive arthropathy, but by report there is a draining wound in this vicinity which increases the likelihood that this could represent localized early osteomyelitis. 2. Diffuse soft tissue edema in the ankle and foot, nonspecific for bland edema versus cellulitis. 3. Thickened plantar fascia suggesting plantar fasciitis. 4. This final interpretation correlates with the preliminary report issued by Dr. Cherly Hensen. Electronically Signed   By: Gaylyn Rong M.D.   On: 01/27/2016 13:32   Dg Foot Complete Right  01/26/2016  CLINICAL DATA:  Right foot wounds, diabetes EXAM: RIGHT FOOT COMPLETE - 3+ VIEW COMPARISON:  12/09/2013 FINDINGS: Three views of the right foot submitted. There is diffuse osteopenia. Mild soft tissue swelling adjacent to distal aspect of first metatarsal. No acute fracture or subluxation. Atherosclerotic vascular calcifications are noted. No bone destruction to suggest osteomyelitis. There is plantar spur of calcaneus. Dorsal spurring anterior aspect of the talus. IMPRESSION: No acute fracture or subluxation. Limited study by diffuse osteopenia. Mild soft tissue swelling adjacent to distal aspect of first metatarsal. No evidence of bone destruction to suggest osteomyelitis. Plantar spur of calcaneus. Degenerative changes with dorsal spurring anterior aspect of the talus. Electronically Signed   By: Natasha Mead M.D.   On: 01/26/2016 17:00    Time Spent in minutes  25   Eddie North M.D on 01/31/2016 at 2:20 PM  Between 7am to 7pm - Pager - [phone]  After 7pm go to [email or web address] - password [facility]  [facility] -  Office  [phone]

## 2016-01-31 NOTE — Progress Notes (Signed)
ANTICOAGULATION CONSULT NOTE  Pharmacy Consult to transition back to Xarelto (on PTA) currently on heparin   Indication: atrial fibrillation  Allergies  Allergen Reactions  . Amiodarone Other (See Comments)    Contributed to hypoxia    Vital Signs: Temp: 98 F (36.7 C) (06/15 0425) Temp Source: Axillary (06/15 0425) BP: 136/93 mmHg (06/15 0425) Pulse Rate: 114 (06/15 0425)  Labs:  Recent Labs  01/29/16 0331  01/29/16 2230 01/30/16 0608 01/31/16 0524  HGB 11.3*  --   --  12.1 11.6*  HCT 35.4*  --   --  38.8 38.2  PLT 323  --   --  337 328  APTT 43*  < > 94* 65* 71*  HEPARINUNFRC 1.20*  --  0.79* 0.84* 0.53  < > = values in this interval not displayed.  Assessment: Heparin for afib while Xarelto on hold for procedure. Scheduled procedures have been performed and to transition back to Xarelto.   Goal of Therapy:  Heparin level 0.3-0.7 units/ml aPTT 66-102 seconds Monitor platelets by anticoagulation protocol: Yes   Plan:  1. d/c heparin 2. resume Xarelto 15 mg daily (based on renal function)   Pollyann Samples, PharmD, BCPS 01/31/2016, 8:58 AM Pager: [phone]

## 2016-01-31 NOTE — Discharge Instructions (Signed)

## 2016-01-31 NOTE — Progress Notes (Signed)
[facility]   Reason for visit: Follow up on MSSA bacteremia  Interval History: Angiogram done with angioplasty.  Afebrile.   Physical Exam: Constitutional:  Filed Vitals:   01/31/16 0425 01/31/16 0900  BP: 136/93 122/78  Pulse: 114 94  Temp: 98 F (36.7 C) 98 F (36.7 C)  Resp: 16 16   patient appears in NAD Respiratory: Normal respiratory effort; CTA B Cardiovascular: RRR  Review of Systems: Constitutional: negative for fevers Gastrointestinal: negative for diarrhea  Lab Results  Component Value Date   WBC 5.9 01/31/2016   HGB 11.6* 01/31/2016   HCT 38.2 01/31/2016   MCV 94.1 01/31/2016   PLT 328 01/31/2016    Lab Results  Component Value Date   CREATININE 0.95 01/28/2016   BUN 13 01/28/2016   NA 136 01/28/2016   K 3.5 01/28/2016   CL 96* 01/28/2016   CO2 30 01/28/2016    Lab Results  Component Value Date   ALT 7* 01/26/2016   AST 17 01/26/2016   ALKPHOS 122 01/26/2016     Microbiology: Recent Results (from the past 240 hour(s))  Blood Culture (routine x 2)     Status: Abnormal   Collection Time: 01/26/16  5:58 PM  Result Value Ref Range Status   Specimen Description BLOOD WRIST LEFT  Final   Special Requests BOTTLES DRAWN AEROBIC ONLY 6CC  Final   Culture  Setup Time   Final    GRAM POSITIVE COCCI IN CLUSTERS AEROBIC BOTTLE ONLY CRITICAL RESULT CALLED TO, READ BACK BY AND VERIFIED WITH: R RUMBARGER,PHARMD AT 0957 01/27/16    Culture STAPHYLOCOCCUS AUREUS (A)  Final   Report Status 01/29/2016 FINAL  Final   Organism ID, Bacteria STAPHYLOCOCCUS AUREUS  Final      Susceptibility   Staphylococcus aureus - MIC*    CIPROFLOXACIN <=0.5 SENSITIVE Sensitive     ERYTHROMYCIN <=0.25 SENSITIVE Sensitive     GENTAMICIN <=0.5 SENSITIVE Sensitive     OXACILLIN 0.5 SENSITIVE Sensitive     TETRACYCLINE <=1 SENSITIVE Sensitive     VANCOMYCIN <=0.5 SENSITIVE Sensitive     TRIMETH/SULFA <=10 SENSITIVE Sensitive     CLINDAMYCIN  <=0.25 SENSITIVE Sensitive     RIFAMPIN <=0.5 SENSITIVE Sensitive     Inducible Clindamycin NEGATIVE Sensitive     * STAPHYLOCOCCUS AUREUS  Blood Culture ID Panel (Reflexed)     Status: Abnormal   Collection Time: 01/26/16  5:58 PM  Result Value Ref Range Status   Enterococcus species NOT DETECTED NOT DETECTED Final   Vancomycin resistance NOT DETECTED NOT DETECTED Final   Listeria monocytogenes NOT DETECTED NOT DETECTED Final   Staphylococcus species DETECTED (A) NOT DETECTED Final    Comment: CRITICAL RESULT CALLED TO, READ BACK BY AND VERIFIED WITH: R RUMBARGER,PHARMD AT 0957 01/27/16    Staphylococcus aureus DETECTED (A) NOT DETECTED Final    Comment: CRITICAL RESULT CALLED TO, READ BACK BY AND VERIFIED WITH: R RUMBARGER,PHARMD AT 0957 01/27/16    Methicillin resistance NOT DETECTED NOT DETECTED Final   Streptococcus species NOT DETECTED NOT DETECTED Final   Streptococcus agalactiae NOT DETECTED NOT DETECTED Final   Streptococcus pneumoniae NOT DETECTED NOT DETECTED Final   Streptococcus pyogenes NOT DETECTED NOT DETECTED Final   Acinetobacter baumannii NOT DETECTED NOT DETECTED Final   Enterobacteriaceae species NOT DETECTED NOT DETECTED Final   Enterobacter cloacae complex NOT DETECTED NOT DETECTED Final   Escherichia coli NOT DETECTED NOT DETECTED Final   Klebsiella oxytoca NOT DETECTED  NOT DETECTED Final   Klebsiella pneumoniae NOT DETECTED NOT DETECTED Final   Proteus species NOT DETECTED NOT DETECTED Final   Serratia marcescens NOT DETECTED NOT DETECTED Final   Carbapenem resistance NOT DETECTED NOT DETECTED Final   Haemophilus influenzae NOT DETECTED NOT DETECTED Final   Neisseria meningitidis NOT DETECTED NOT DETECTED Final   Pseudomonas aeruginosa NOT DETECTED NOT DETECTED Final   Candida albicans NOT DETECTED NOT DETECTED Final   Candida glabrata NOT DETECTED NOT DETECTED Final   Candida krusei NOT DETECTED NOT DETECTED Final   Candida parapsilosis NOT DETECTED NOT  DETECTED Final   Candida tropicalis NOT DETECTED NOT DETECTED Final  Blood Culture (routine x 2)     Status: Abnormal   Collection Time: 01/26/16  6:01 PM  Result Value Ref Range Status   Specimen Description BLOOD RIGHT HAND  Final   Special Requests BOTTLES DRAWN AEROBIC AND ANAEROBIC 5CC  Final   Culture  Setup Time   Final    GRAM POSITIVE COCCI IN CLUSTERS IN BOTH AEROBIC AND ANAEROBIC BOTTLES CRITICAL RESULT CALLED TO, READ BACK BY AND VERIFIED WITH: R RUMBARGER,PHARMD AT 0957 01/27/16    Culture (A)  Final    STAPHYLOCOCCUS AUREUS SUSCEPTIBILITIES PERFORMED ON PREVIOUS CULTURE WITHIN THE LAST 5 DAYS.    Report Status 01/29/2016 FINAL  Final  Urine culture     Status: Abnormal   Collection Time: 01/27/16  1:15 AM  Result Value Ref Range Status   Specimen Description URINE, RANDOM  Final   Special Requests NONE  Final   Culture MULTIPLE SPECIES PRESENT, SUGGEST RECOLLECTION (A)  Final   Report Status 01/28/2016 FINAL  Final  Blood Cultures x 2 sites     Status: None (Preliminary result)   Collection Time: 01/27/16  4:04 PM  Result Value Ref Range Status   Specimen Description BLOOD RIGHT ANTECUBITAL  Final   Special Requests BOTTLES DRAWN AEROBIC ONLY 5CC  Final   Culture NO GROWTH 3 DAYS  Final   Report Status PENDING  Incomplete  Blood Cultures x 2 sites     Status: Abnormal   Collection Time: 01/27/16  4:15 PM  Result Value Ref Range Status   Specimen Description BLOOD RIGHT WRIST  Final   Special Requests IN PEDIATRIC BOTTLE 2CC  Final   Culture  Setup Time   Final    GRAM POSITIVE COCCI IN CLUSTERS IN PEDIATRIC BOTTLE CRITICAL RESULT CALLED TO, READ BACK BY AND VERIFIED WITH: Celedonio Miyamoto  [id number] AT [address]    Culture (A)  Final    STAPHYLOCOCCUS AUREUS SUSCEPTIBILITIES PERFORMED ON PREVIOUS CULTURE WITHIN THE LAST 5 DAYS.    Report Status 01/30/2016 FINAL  Final  Culture, blood (routine x 2)     Status: None (Preliminary result)   Collection Time:  01/30/16  6:25 AM  Result Value Ref Range Status   Specimen Description BLOOD RIGHT FOREARM  Final   Special Requests IN PEDIATRIC BOTTLE  3CC  Final   Culture PENDING  Incomplete   Report Status PENDING  Incomplete    Impression/Plan:  1. MSSA bacteremia - possibly from foot infection, on cefazolin.  TTE pending. Repeat blood cultures again positive 1/2.  Blood cultures repeated yesterday.  TEE tomorrow to determine duration.   2.  Wound infection - s/p angioplasty.    Dr Drue Second on tomorrow

## 2016-01-31 NOTE — Progress Notes (Signed)
01/31/2016 2:52 PM  Attempted to contact son to sign consent for TEE with contact number provided in chart.  No answer at this time.    [name]

## 2016-01-31 NOTE — Progress Notes (Signed)
    [facility] has been requested to perform a transesophageal echocardiogram on [name] to r/o endocarditis.  After careful review of history and examination with two daughter at bedside and patient, the risks and benefits of transesophageal echocardiogram have been explained including risks of esophageal damage, perforation (1:10,000 risk), bleeding, pharyngeal hematoma as well as other potential complications associated with conscious sedation including aspiration, arrhythmia, respiratory failure and death. Alternatives to treatment were discussed, questions were answered. Patient and daughters who is POC are willing to proceed.  TEE - @ 2pm . NPO after midnight. Meds with sips.   Marney Doctor 01/31/2016 6:36 PM

## 2016-01-31 NOTE — Progress Notes (Signed)
ANTICOAGULATION CONSULT NOTE - Follow Up Consult  Pharmacy Consult for Heparin (while Xarelto on hold) Indication: atrial fibrillation  Allergies  Allergen Reactions  . Amiodarone Other (See Comments)    Contributed to hypoxia    Vital Signs: Temp: 98 F (36.7 C) (06/15 0425) Temp Source: Axillary (06/15 0425) BP: 136/93 mmHg (06/15 0425) Pulse Rate: 114 (06/15 0425)  Labs:  Recent Labs  01/29/16 0331  01/29/16 2230 01/30/16 0608 01/31/16 0524  HGB 11.3*  --   --  12.1 11.6*  HCT 35.4*  --   --  38.8 38.2  PLT 323  --   --  337 328  APTT 43*  < > 94* 65* 71*  HEPARINUNFRC 1.20*  --  0.79* 0.84* 0.53  < > = values in this interval not displayed.  Assessment: Heparin for afib while Xarelto on hold for procedure, using aPTTs to dose heparin for now given Xarelto influence on anti-Xa levels. APTT 71 sec (therapeutic), heparin level down to 0.53 so almost correlating. CBC stable.  Goal of Therapy:  Heparin level 0.3-0.7 units/ml aPTT 66-102 seconds Monitor platelets by anticoagulation protocol: Yes   Plan:  - Continue heparin at 1200 units/h - F/u aPTT and heparin level in a.m. - F/U resuming Xarelto at 15 mg dose as appropriate  Christoper Fabian, PharmD, BCPS Clinical pharmacist, pager [phone] 01/31/2016 6:41 AM

## 2016-02-01 ENCOUNTER — Encounter (HOSPITAL_COMMUNITY)
Admission: EM | Disposition: A | Discharge status: Home Health | Payer: Self-pay | Source: Home / Self Care | Attending: Internal Medicine

## 2016-02-01 ENCOUNTER — Encounter (HOSPITAL_COMMUNITY): Payer: Self-pay | Admitting: *Deleted

## 2016-02-01 ENCOUNTER — Other Ambulatory Visit (HOSPITAL_COMMUNITY): Payer: Medicare Other

## 2016-02-01 DIAGNOSIS — M86171 Other acute osteomyelitis, right ankle and foot: Secondary | ICD-10-CM

## 2016-02-01 DIAGNOSIS — I482 Chronic atrial fibrillation: Secondary | ICD-10-CM

## 2016-02-01 DIAGNOSIS — I5042 Chronic combined systolic (congestive) and diastolic (congestive) heart failure: Secondary | ICD-10-CM

## 2016-02-01 LAB — GLUCOSE, CAPILLARY
Glucose-Capillary: 121 mg/dL — ABNORMAL HIGH (ref 65–99)
Glucose-Capillary: 125 mg/dL — ABNORMAL HIGH (ref 65–99)
Glucose-Capillary: 122 mg/dL — ABNORMAL HIGH (ref 65–99)
Glucose-Capillary: 277 mg/dL — ABNORMAL HIGH (ref 65–99)

## 2016-02-01 LAB — CBC
HEMATOCRIT: 43 % (ref 36.0–46.0)
HEMOGLOBIN: 13.8 g/dL (ref 12.0–15.0)
MCH: 29.7 pg (ref 26.0–34.0)
MCHC: 32.1 g/dL (ref 30.0–36.0)
MCV: 92.7 fL (ref 78.0–100.0)
Platelets: 300 10*3/uL (ref 150–400)
RBC: 4.64 MIL/uL (ref 3.87–5.11)
RDW: 14.6 % (ref 11.5–15.5)
WBC: 5.5 10*3/uL (ref 4.0–10.5)

## 2016-02-01 LAB — BASIC METABOLIC PANEL
Anion gap: 13 (ref 5–15)
BUN: 14 mg/dL (ref 6–20)
CHLORIDE: 96 mmol/L — AB (ref 101–111)
CO2: 28 mmol/L (ref 22–32)
Calcium: 9 mg/dL (ref 8.9–10.3)
Creatinine, Ser: 0.89 mg/dL (ref 0.44–1.00)
GFR calc Af Amer: >60 mL/min (ref >60)
GFR calc non Af Amer: 56 mL/min — ABNORMAL LOW (ref >60)
Glucose, Bld: 140 mg/dL — ABNORMAL HIGH (ref 65–99)
POTASSIUM: 5.2 mmol/L — AB (ref 3.5–5.1)
SODIUM: 137 mmol/L (ref 135–145)

## 2016-02-01 LAB — CULTURE, BLOOD (ROUTINE X 2): Culture: NO GROWTH

## 2016-02-01 SURGERY — CANCELLED PROCEDURE

## 2016-02-01 MED ORDER — MIDAZOLAM HCL 5 MG/ML IJ SOLN
INTRAMUSCULAR | Status: AC
  Filled 2016-02-01: qty 2

## 2016-02-01 MED ORDER — DIPHENHYDRAMINE HCL 50 MG/ML IJ SOLN
INTRAMUSCULAR | Status: AC
  Filled 2016-02-01: qty 1

## 2016-02-01 MED ORDER — INSULIN ASPART 100 UNIT/ML ~~LOC~~ SOLN
0.0000 [IU] | Freq: Three times a day (TID) | SUBCUTANEOUS | Status: DC
  Administered 2016-02-02: 3 [IU] via SUBCUTANEOUS
  Administered 2016-02-02: 5 [IU] via SUBCUTANEOUS
  Administered 2016-02-03 (×2): 3 [IU] via SUBCUTANEOUS

## 2016-02-01 MED ORDER — PRO-STAT SUGAR FREE PO LIQD
30.0000 mL | Freq: Every day | ORAL | Status: DC
  Administered 2016-02-01 – 2016-02-03 (×3): 30 mL via ORAL
  Filled 2016-02-01 (×3): qty 30

## 2016-02-01 MED ORDER — FENTANYL CITRATE (PF) 100 MCG/2ML IJ SOLN
INTRAMUSCULAR | Status: AC
  Filled 2016-02-01: qty 2

## 2016-02-01 MED ORDER — INSULIN ASPART 100 UNIT/ML ~~LOC~~ SOLN
0.0000 [IU] | Freq: Every day | SUBCUTANEOUS | Status: DC
  Administered 2016-02-01 – 2016-02-02 (×2): 3 [IU] via SUBCUTANEOUS

## 2016-02-01 NOTE — Progress Notes (Addendum)
[name]   Reason for visit: Follow up on MSSA bacteremia  Interval History:  Afebrile. Remains NPO for TEE  No current facility-administered medications on file prior to encounter.   Current Outpatient Prescriptions on File Prior to Encounter  Medication Sig Dispense Refill  . acetaminophen-codeine (TYLENOL #3) 300-30 MG tablet Take 1 tablet by mouth every 8 (eight) hours as needed (for pain). 21 tablet 0  . albuterol (PROVENTIL HFA;VENTOLIN HFA) 108 (90 Base) MCG/ACT inhaler Inhale 2 puffs into the lungs every 6 (six) hours as needed for wheezing or shortness of breath. 1 Inhaler 2  . BD ULTRA-FINE LANCETS lancets Use to check blood sugar one time daily 100 each 5  . brimonidine (ALPHAGAN P) 0.1 % SOLN Place 1 drop into the left eye every 12 (twelve) hours. 45 mL 0  . dorzolamide (TRUSOPT) 2 % ophthalmic solution Place 1 drop into the left eye 2 (two) times daily. 10 mL 3  . DULERA 100-5 MCG/ACT AERO INHALE 2 PUFFS INTO THE LUNGS TWICE DAILY 13 g 3  . ferrous sulfate 325 (65 FE) MG tablet TAKE 1 TABLET(325 MG) BY MOUTH DAILY 30 tablet 3  . furosemide (LASIX) 80 MG tablet Take 54m in the AM and 466min the PM 45 tablet 3  . glucose blood test strip Use as instructed 100 each 12  . lisinopril (PRINIVIL,ZESTRIL) 20 MG tablet Take 1 tablet (20 mg total) by mouth daily. 30 tablet 3  . metFORMIN (GLUCOPHAGE) 1000 MG tablet TAKE 1 TABLET(1000 MG) BY MOUTH TWICE DAILY WITH A MEAL 180 tablet 1  . metoprolol succinate (TOPROL-XL) 100 MG 24 hr tablet Take 1 tablet twice daily. 60 tablet 3  . potassium chloride SA (K-DUR,KLOR-CON) 20 MEQ tablet Take 2 tablets (40 mEq total) by mouth daily. Only take this medication on days you take Lasix. 60 tablet 0  . ranitidine (ZANTAC) 75 MG tablet Take 75 mg by mouth daily as needed for heartburn.    . rivaroxaban (XARELTO) 20 MG TABS tablet Take 20 mg by mouth daily with supper.    . Marland Kitchenpironolactone (ALDACTONE) 25 MG tablet  TAKE 1 TABLET BY MOUTH EVERY DAY 90 tablet 1    Physical Exam: Constitutional:  Filed Vitals:   02/01/16 0553 02/01/16 0742  BP: 138/80 144/79  Pulse: 72 91  Temp: 97.5 F (36.4 C) 98.1 F (36.7 C)  Resp: 17 18   patient appears in NAD HEENT: edentulous, dry oral pharynx Respiratory: Normal respiratory effort; CTA B Cardiovascular: RRR Ext: right foot eschar to lateral 1st digit ,and distal 1st digit  Review of Systems: Constitutional: negative for fevers Gastrointestinal: negative for diarrhea  Lab Results  Component Value Date   WBC 5.5 02/01/2016   HGB 13.8 02/01/2016   HCT 43.0 02/01/2016   MCV 92.7 02/01/2016   PLT 300 02/01/2016    Lab Results  Component Value Date   CREATININE 0.89 02/01/2016   BUN 14 02/01/2016   NA 137 02/01/2016   K 5.2* 02/01/2016   CL 96* 02/01/2016   CO2 28 02/01/2016    Lab Results  Component Value Date   ALT 7* 01/26/2016   AST 17 01/26/2016   ALKPHOS 122 01/26/2016     Microbiology: Recent Results (from the past 240 hour(s))  Blood Culture (routine x 2)     Status: Abnormal   Collection Time: 01/26/16  5:58 PM  Result Value Ref Range Status   Specimen Description BLOOD WRIST LEFT  Final   Special Requests BOTTLES DRAWN AEROBIC ONLY 6CC  Final   Culture  Setup Time   Final    GRAM POSITIVE COCCI IN CLUSTERS AEROBIC BOTTLE ONLY CRITICAL RESULT CALLED TO, READ BACK BY AND VERIFIED WITH: R RUMBARGER,PHARMD AT 0957 01/27/16    Culture STAPHYLOCOCCUS AUREUS (A)  Final   Report Status 01/29/2016 FINAL  Final   Organism ID, Bacteria STAPHYLOCOCCUS AUREUS  Final      Susceptibility   Staphylococcus aureus - MIC*    CIPROFLOXACIN <=0.5 SENSITIVE Sensitive     ERYTHROMYCIN <=0.25 SENSITIVE Sensitive     GENTAMICIN <=0.5 SENSITIVE Sensitive     OXACILLIN 0.5 SENSITIVE Sensitive     TETRACYCLINE <=1 SENSITIVE Sensitive     VANCOMYCIN <=0.5 SENSITIVE Sensitive     TRIMETH/SULFA <=10 SENSITIVE Sensitive     CLINDAMYCIN <=0.25  SENSITIVE Sensitive     RIFAMPIN <=0.5 SENSITIVE Sensitive     Inducible Clindamycin NEGATIVE Sensitive     * STAPHYLOCOCCUS AUREUS  Blood Culture ID Panel (Reflexed)     Status: Abnormal   Collection Time: 01/26/16  5:58 PM  Result Value Ref Range Status   Enterococcus species NOT DETECTED NOT DETECTED Final   Vancomycin resistance NOT DETECTED NOT DETECTED Final   Listeria monocytogenes NOT DETECTED NOT DETECTED Final   Staphylococcus species DETECTED (A) NOT DETECTED Final    Comment: CRITICAL RESULT CALLED TO, READ BACK BY AND VERIFIED WITH: R RUMBARGER,PHARMD AT 0957 01/27/16    Staphylococcus aureus DETECTED (A) NOT DETECTED Final    Comment: CRITICAL RESULT CALLED TO, READ BACK BY AND VERIFIED WITH: R RUMBARGER,PHARMD AT 0957 01/27/16    Methicillin resistance NOT DETECTED NOT DETECTED Final   Streptococcus species NOT DETECTED NOT DETECTED Final   Streptococcus agalactiae NOT DETECTED NOT DETECTED Final   Streptococcus pneumoniae NOT DETECTED NOT DETECTED Final   Streptococcus pyogenes NOT DETECTED NOT DETECTED Final   Acinetobacter baumannii NOT DETECTED NOT DETECTED Final   Enterobacteriaceae species NOT DETECTED NOT DETECTED Final   Enterobacter cloacae complex NOT DETECTED NOT DETECTED Final   Escherichia coli NOT DETECTED NOT DETECTED Final   Klebsiella oxytoca NOT DETECTED NOT DETECTED Final   Klebsiella pneumoniae NOT DETECTED NOT DETECTED Final   Proteus species NOT DETECTED NOT DETECTED Final   Serratia marcescens NOT DETECTED NOT DETECTED Final   Carbapenem resistance NOT DETECTED NOT DETECTED Final   Haemophilus influenzae NOT DETECTED NOT DETECTED Final   Neisseria meningitidis NOT DETECTED NOT DETECTED Final   Pseudomonas aeruginosa NOT DETECTED NOT DETECTED Final   Candida albicans NOT DETECTED NOT DETECTED Final   Candida glabrata NOT DETECTED NOT DETECTED Final   Candida krusei NOT DETECTED NOT DETECTED Final   Candida parapsilosis NOT DETECTED NOT DETECTED  Final   Candida tropicalis NOT DETECTED NOT DETECTED Final  Blood Culture (routine x 2)     Status: Abnormal   Collection Time: 01/26/16  6:01 PM  Result Value Ref Range Status   Specimen Description BLOOD RIGHT HAND  Final   Special Requests BOTTLES DRAWN AEROBIC AND ANAEROBIC 5CC  Final   Culture  Setup Time   Final    GRAM POSITIVE COCCI IN CLUSTERS IN BOTH AEROBIC AND ANAEROBIC BOTTLES CRITICAL RESULT CALLED TO, READ BACK BY AND VERIFIED WITH: R RUMBARGER,PHARMD AT 0957 01/27/16    Culture (A)  Final    STAPHYLOCOCCUS AUREUS SUSCEPTIBILITIES PERFORMED ON PREVIOUS CULTURE WITHIN THE LAST 5 DAYS.    Report Status 01/29/2016 FINAL  Final  Urine culture     Status: Abnormal   Collection Time: 01/27/16  1:15 AM  Result Value Ref Range Status   Specimen Description URINE, RANDOM  Final   Special Requests NONE  Final   Culture MULTIPLE SPECIES PRESENT, SUGGEST RECOLLECTION (A)  Final   Report Status 01/28/2016 FINAL  Final  Blood Cultures x 2 sites     Status: None (Preliminary result)   Collection Time: 01/27/16  4:04 PM  Result Value Ref Range Status   Specimen Description BLOOD RIGHT ANTECUBITAL  Final   Special Requests BOTTLES DRAWN AEROBIC ONLY 5CC  Final   Culture NO GROWTH 4 DAYS  Final   Report Status PENDING  Incomplete  Blood Cultures x 2 sites     Status: Abnormal   Collection Time: 01/27/16  4:15 PM  Result Value Ref Range Status   Specimen Description BLOOD RIGHT WRIST  Final   Special Requests IN PEDIATRIC BOTTLE 2CC  Final   Culture  Setup Time   Final    GRAM POSITIVE COCCI IN CLUSTERS IN PEDIATRIC BOTTLE CRITICAL RESULT CALLED TO, READ BACK BY AND VERIFIED WITH: Heide Guile  [id number] AT [place] S[address]YARBR[address]    Culture (A)  Final    STAPHYLOCOCCUS AUREUS SUSCEPTIBILITIES PERFORMED ON PREVIOUS CULTURE WITHIN THE LAST 5 DAYS.    Report Status 01/30/2016 FINAL  Final  Culture, blood (routine x 2)     Status: None (Preliminary result)   Collection Time: 01/30/16   6:20 AM  Result Value Ref Range Status   Specimen Description BLOOD RIGHT ANTECUBITAL  Final   Special Requests IN PEDIATRIC BOTTLE  3CC  Final   Culture NO GROWTH 1 DAY  Final   Report Status PENDING  Incomplete  Culture, blood (routine x 2)     Status: None (Preliminary result)   Collection Time: 01/30/16  6:25 AM  Result Value Ref Range Status   Specimen Description BLOOD RIGHT FOREARM  Final   Special Requests IN PEDIATRIC BOTTLE  3CC  Final   Culture NO GROWTH 1 DAY  Final   Report Status PENDING  Incomplete    Impression/Plan:  1. MSSA bacteremia - possibly from foot infection,  Continue on cefazolin. Spoke with Dr. Debara Pickett and felt that patient is too high risk for complications of TEE and may not have impact on length of treatment. Initially had persistent bacteremia, but 6/14 blood cx are NGTD. Can get picc line.  Will likely treat for 6 wk to treat for early osteo, will need cefazolin renally dosed.   2.  Early osteo/diabetic foot Wound infection - continue on cefazolin. She has been revascularized s/p angioplasty. Still concern that she may not have good outcome and may need debridement later on. Would recommend to follow up with wound care clinic for discharge   3. Diabetes = continue with moderate control of BS.  - have discussed plan with dr. Raeanne Barry and hilty.  Home health orders listed below:  Diagnosis: Diabetic foot osteo/ bactermia  Culture Result: MSSA  Allergies  Allergen Reactions  . Amiodarone Other (See Comments)    Contributed to hypoxia    Discharge antibiotics: Per pharmacy protocol  cefazolin  Duration: 6 wk, using 6/14 as day 1  End Date: March 13, 2015  [facility] Care Per Protocol:  Labs weekly while on IV antibiotics: __x_ CBC with differential __X_ CMP __X_ CRP __X_ ESR   Fax weekly labs to [phone]  Clinic Follow Up Appt: 4-6 wk  _0  with Andrius Andrepont  or comer

## 2016-02-01 NOTE — Progress Notes (Signed)
Nutrition Follow-up  DOCUMENTATION CODES:   Not applicable  INTERVENTION:  Continue MVI daily.  Provide 30 ml Prostat po once daily, each supplement provides 100 kcal and 15 grams of protein.   Encourage adequate PO intake.   NUTRITION DIAGNOSIS:   Increased nutrient needs related to wound healing as evidenced by estimated needs; ongoing  GOAL:   Patient will meet greater than or equal to 90% of their needs; met  MONITOR:   PO intake, Supplement acceptance, Labs, Weight trends, Skin, I & O's  REASON FOR ASSESSMENT:   Consult Wound healing  ASSESSMENT:   [name] is a 82 y.o. female with medical history significant of diabetes, atrial fibrillation presented to the emergency room with complaint of diabetic foot infection.  TEE cancelled today. Diet has been advanced. Previous meal completion has been 50-100% with most intake at 90-100%. Pt was unavailable during time of visit. RD to order Prostat to aid in protein needs and wound healing. RD to continue to monitor for needs.   Labs and medications reviewed. Potassium elevated at 5.2.  Diet Order:  Diet heart healthy/carb modified Room service appropriate?: Yes; Fluid consistency:: Thin  Skin:  Wound (see comment) (Stage 2 on sacrum, unstg diabetic ulcer R foot)  Last BM:  6/15  Height:   Ht Readings from Last 1 Encounters:  02/01/16 5' 7"  (1.702 m)    Weight:   Wt Readings from Last 1 Encounters:  02/01/16 133 lb 2.5 oz (60.4 kg)    Ideal Body Weight:  61.4 kg  BMI:  Body mass index is 20.85 kg/(m^2).  Estimated Nutritional Needs:   Kcal:  1500-1700  Protein:  80-95 grams  Fluid:  1.5-1.7 L  EDUCATION NEEDS:   No education needs identified at this time  Corrin Parker, MS, RD, LDN Pager # [phone] After hours/ weekend pager # [phone]

## 2016-02-01 NOTE — Care Management Note (Signed)
Case Management Note  Patient Details  Name: [name] MRN: [id number] Date of Birth: September 19, 1925  Subjective/Objective:          CM following for progression and d/c planning.          Action/Plan: 02/01/2016 Await PT eval and recommendations. Will assist with d/c needs as plans determined.   Expected Discharge Date:                  Expected Discharge Plan:  Skilled Nursing Facility  In-House Referral:  Clinical Social Work  Discharge planning Services  CM Consult  Post Acute Care Choice:    Choice offered to:     DME Arranged:    DME Agency:     HH Arranged:    HH Agency:     Status of Service:  In process, will continue to follow  Medicare Important Message Given:  Yes Date Medicare IM Given:    Medicare IM give by:    Date Additional Medicare IM Given:    Additional Medicare Important Message give by:     If discussed at Long Length of Stay Meetings, dates discussed:    Additional Comments:  Starlyn Skeans, RN 02/01/2016, 12:56 PM

## 2016-02-01 NOTE — Evaluation (Signed)
Physical Therapy Evaluation Patient Details Name: [name] MRN: [id number] DOB: 27-Jun-1926 Today's Date: 02/01/2016   History of Present Illness  82 yo female with dementia and new R great toe infection with hx DM, now here for sepsis.  PMHx:  Graves disease, vasculitis, CHF, DM   Clinical Impression  Pt is quite limited in her presentation of mobiltiy and very willing to try to work so will continue to instruct her on the basics of standing balance and transfers as she is able.  Have expectation that SNF care may be her final destination after rehab, but PT will follow acutely to see how much can be accomplished now.    Follow Up Recommendations SNF    Equipment Recommendations  None recommended by PT    Recommendations for Other Services Rehab consult     Precautions / Restrictions Precautions Precautions: Fall Restrictions Weight Bearing Restrictions: No      Mobility  Bed Mobility Overal bed mobility: +2 for physical assistance;Needs Assistance;+ 2 for safety/equipment Bed Mobility: Supine to Sit;Sit to Supine     Supine to sit: Max assist Sit to supine: Max assist   General bed mobility comments: pt needs continual cues for movement to bed  Transfers Overall transfer level: Needs assistance Equipment used: Rolling walker (2 wheeled);2 person hand held assist Transfers: Sit to/from Stand;Stand Pivot Transfers Sit to Stand: +2 physical assistance;+2 safety/equipment;From elevated surface;Max assist Stand pivot transfers: Max assist;+2 physical assistance;+2 safety/equipment;From elevated surface       General transfer comment: Pt unable to use walker as she is not focused enough to hold the device  Ambulation/Gait             General Gait Details: not yet ambulatory  Stairs            Wheelchair Mobility    Modified Rankin (Stroke Patients Only)       Balance Overall balance assessment: Needs assistance Sitting-balance support: Feet  supported Sitting balance-Leahy Scale: Fair       Standing balance-Leahy Scale: Zero                               Pertinent Vitals/Pain Pain Assessment: No/denies pain    Home Living Family/patient expects to be discharged to:: Unsure                      Prior Function Level of Independence: Needs assistance   Gait / Transfers Assistance Needed: pt unable to state  ADL's / Homemaking Assistance Needed: information incomplete from pt        Hand Dominance        Extremity/Trunk Assessment   Upper Extremity Assessment: Generalized weakness           Lower Extremity Assessment: Generalized weakness      Cervical / Trunk Assessment: Kyphotic  Communication   Communication: No difficulties  Cognition Arousal/Alertness: Lethargic Behavior During Therapy: Flat affect Overall Cognitive Status: No family/caregiver present to determine baseline cognitive functioning       Memory: Decreased recall of precautions;Decreased short-term memory              General Comments General comments (skin integrity, edema, etc.): Pt is not a good historian and so information about her PLOF is not available    Exercises        Assessment/Plan    PT Assessment Patient needs continued PT services  PT Diagnosis Generalized weakness  PT Problem List Decreased strength;Decreased range of motion;Decreased activity tolerance;Decreased balance;Decreased mobility;Decreased coordination;Decreased cognition;Decreased knowledge of use of DME;Decreased safety awareness;Decreased knowledge of precautions;Cardiopulmonary status limiting activity;Decreased skin integrity  PT Treatment Interventions DME instruction;Functional mobility training;Therapeutic activities;Balance training;Therapeutic exercise;Neuromuscular re-education;Cognitive remediation;Patient/family education   PT Goals (Current goals can be found in the Care Plan section) Acute Rehab PT  Goals Patient Stated Goal: none stated PT Goal Formulation: Patient unable to participate in goal setting Time For Goal Achievement: 02/15/16 Potential to Achieve Goals: Fair    Frequency Min 2X/week   Barriers to discharge Other (comment) (unknown)      Co-evaluation               End of Session Equipment Utilized During Treatment: Gait belt Activity Tolerance: Patient limited by fatigue;Patient limited by lethargy Patient left: in bed;with call bell/phone within reach;with nursing/sitter in room (could not leave in chair as pt has a procedure this afternoo) Nurse Communication: Mobility status         Time: 1833-5825 PT Time Calculation (min) (ACUTE ONLY): 28 min   Charges:   PT Evaluation $PT Eval Low Complexity: 1 Procedure PT Treatments $Therapeutic Activity: 8-22 mins   PT G Codes:        Ivar Drape 02-11-2016, 2:22 PM    Samul Dada, PT MS Acute Rehab Dept. Number: [facility] [id number] and [facility] [phone]

## 2016-02-01 NOTE — Care Management Important Message (Signed)
Important Message  Patient Details  Name: [name] MRN: [id number] Date of Birth: 19-Jun-1926   Medicare Important Message Given:  Yes    Bernadette Hoit 02/01/2016, 9:33 AM

## 2016-02-01 NOTE — Progress Notes (Signed)
PROGRESS NOTE                                                                                                                                                                                                             Patient Demographics:    [name], is a 82 y.o. female, DOB - 1926-06-16, BTD:[id number]  Admit date - 01/26/2016   Admitting Physician Haydee Salter, MD  Outpatient Primary MD for the patient is Gust Rung, DO  LOS - 5  Outpatient Specialists: Heart failure clinic  Dr Imogene Burn  Chief Complaint  Patient presents with  . Foot Pain       Brief Narrative   82 year old female with history of A. fib on anticoagulation, hypertension, Churg-Strauss syndrome, combined systolic and diastolic CHF, diabetes mellitus presented with diabetic foot infection. Patient injured her right foot about 2 weeks prior to admission from the metal frame on the bottom of the walker. Patient was tachycardic with low-grade fever and elevated lactic acid of 3 on presentation and started on empiric antibiotics. Blood cultures grew MSSA and antibiotic was narrowed to IV cefazolin. MRI of the right foot was concerning for osteomyelitis. Vascular surgery was consulted for nonhealing wound in her right foot with decreased ABIs. Patient underwent lower extremity angiogram showing occlusion of all 3 tibial vessels with recanalization of occluded posterior tibial artery and subsequent balloon angioplasty.   Subjective:   No overnight issues.   Assessment  & Plan :    Sepsis With MSSA bacteremia On empiric cefazolin. Source is likely foot infection. Repeat blood 6/14 negative. Ordered PICC line.. 2-D echo done on 6/13 shows no vegetation. TEE scheduled this afternoon. Antibiotic duration based on TEE result and ID recommendation.    Peripheral vascular disease with ischemic foot changes -Lower extremity angiogram with  recanalization of occluded posterior tibial artery. Tolerated procedure well. -Wound care following for decubitus heel ulcer. -MRI foot shows erosive lesion medial head first metatarsal with low-level edema and enhancement; diffuse soft tissue edema of the ankle and foot. Suspect osteomyelitis.  diabetes mellitus type 2 with renal manifestations -Continue NovoLog sliding scale -01/27/2016 hemoglobin A1c 7.9 -Holding home metformin  Chronic systolic CHF/NICM -09/21/2014 echo EF 20-25%, severe diffuse HK, severe TR, severe RV dyfx -Continue furosemide , ACEi ,BB and aldactone   Chronic Atrial Fibrillation -CHADS2Vasc of 6,on  xarelto -On 01/29/16-Developed RVR (metoprolol was held to soft blood pressure the previous day.). Required Cardizem drip briefly. . Now stable after metoprolol dose increased. -Continue Xarelto.      Code Status :Full code  Family Communication  : None at bedside   Disposition Plan  : May need SNF. PT evaluation pending. Awaiting TEE.   Barriers For Discharge : TEE. PT evaluation.  Consults  :  ID Vascular surgery Cardiology for TEE  Procedures  :  MRI right foot 2-D echo Lower extremity angiogram TEE  DVT Prophylaxis  : Xarelto  Lab Results  Component Value Date   PLT 300 02/01/2016    Antibiotics  :    Anti-infectives    Start     Dose/Rate Route Frequency Ordered Stop   01/27/16 1700  vancomycin (VANCOCIN) IVPB 750 mg/150 ml premix  Status:  Discontinued     750 mg 150 mL/hr over 60 Minutes Intravenous Every 24 hours 01/26/16 1659 01/27/16 1253   01/27/16 1400  ceFAZolin (ANCEF) IVPB 1 g/50 mL premix     1 g 100 mL/hr over 30 Minutes Intravenous Every 8 hours 01/27/16 1333     01/26/16 1700  piperacillin-tazobactam (ZOSYN) IVPB 3.375 g  Status:  Discontinued     3.375 g 100 mL/hr over 30 Minutes Intravenous  Once 01/26/16 1655 01/26/16 1658   01/26/16 1700  vancomycin (VANCOCIN) IVPB 1000 mg/200 mL premix     1,000 mg 200 mL/hr  over 60 Minutes Intravenous  Once 01/26/16 1655 01/26/16 1931   01/26/16 1700  piperacillin-tazobactam (ZOSYN) IVPB 3.375 g  Status:  Discontinued     3.375 g 12.5 mL/hr over 240 Minutes Intravenous Every 8 hours 01/26/16 1658 01/27/16 1333        Objective:   Filed Vitals:   01/31/16 2001 02/01/16 0553 02/01/16 0742 02/01/16 0824  BP:  138/80 144/79   Pulse:  72 91   Temp:  97.5 F (36.4 C) 98.1 F (36.7 C)   TempSrc:  Oral Oral   Resp:  17 18   Weight:      SpO2: 98% 99% 98% 100%    Wt Readings from Last 3 Encounters:  01/31/16 60.4 kg (133 lb 2.5 oz)  01/22/16 67.813 kg (149 lb 8 oz)  09/14/15 73.664 kg (162 lb 6.4 oz)     Intake/Output Summary (Last 24 hours) at 02/01/16 1358 Last data filed at 02/01/16 1323  Gross per 24 hour  Intake    290 ml  Output    101 ml  Net    189 ml     Physical Exam  Gen: not in distress HEENT:  moist mucosa, supple neck Chest: clear b/l, no added sounds CVS: S1 and S2 irregularly irregular, no murmurs, rubs or gallop GI: soft, NT, ND, BS+ Musculoskeletal: warm, no edema, dressing over rt leg, CNS: AAOX2, non focal    Data Review:    CBC  Recent Labs Lab 01/26/16 1609  01/28/16 0416 01/29/16 0331 01/30/16 0608 01/31/16 0524 02/01/16 0655  WBC 6.7  < > 9.6 7.6 7.6 5.9 5.5  HGB 13.6  < > 12.5 11.3* 12.1 11.6* 13.8  HCT 42.3  < > 40.2 35.4* 38.8 38.2 43.0  PLT 363  < > 287 323 337 328 300  MCV 94.2  < > 95.9 92.7 94.6 94.1 92.7  MCH 30.3  < > 29.8 29.6 29.5 28.6 29.7  MCHC 32.2  < > 31.1 31.9 31.2 30.4 32.1  RDW 14.8  < >  14.7 14.6 14.6 14.6 14.6  LYMPHSABS 0.9  --   --   --   --   --   --   MONOABS 0.8  --   --   --   --   --   --   EOSABS 0.1  --   --   --   --   --   --   BASOSABS 0.0  --   --   --   --   --   --   < > = values in this interval not displayed.  Chemistries   Recent Labs Lab 01/26/16 1458 01/26/16 1609 01/27/16 0452 01/28/16 0416 01/29/16 0331 02/01/16 0655  NA 138 137 138 136  --   137  K 4.1 4.2 3.7 3.5  --  5.2*  CL 95* 97* 98* 96*  --  96*  CO2  --  --  28  GLUCOSE 174* 160* 124* 123*  --  140*  BUN --  14  CREATININE 1.10* 1.22* 1.16* 0.95  --  0.89  CALCIUM  --  8.3* 8.0* 7.5*  --  9.0  MG  --   --   --   --  1.7  --   AST  --  17  --   --   --   --   ALT  --  7*  --   --   --   --   ALKPHOS  --  122  --   --   --   --   BILITOT  --  1.4*  --   --   --   --    ------------------------------------------------------------------------------------------------------------------ No results for input(s): CHOL, HDL, LDLCALC, TRIG, CHOLHDL, LDLDIRECT in the last 72 hours.  Lab Results  Component Value Date   HGBA1C 7.9* 01/27/2016   ------------------------------------------------------------------------------------------------------------------ No results for input(s): TSH, T4TOTAL, T3FREE, THYROIDAB in the last 72 hours.  Invalid input(s): FREET3 ------------------------------------------------------------------------------------------------------------------ No results for input(s): VITAMINB12, FOLATE, FERRITIN, TIBC, IRON, RETICCTPCT in the last 72 hours.  Coagulation profile No results for input(s): INR, PROTIME in the last 168 hours.  No results for input(s): DDIMER in the last 72 hours.  Cardiac Enzymes No results for input(s): CKMB, TROPONINI, MYOGLOBIN in the last 168 hours.  Invalid input(s): CK ------------------------------------------------------------------------------------------------------------------    Component Value Date/Time   BNP 805.5* 01/22/2016 1616   BNP 1069.1* 08/01/2014 1635    Inpatient Medications  Scheduled Meds: . brimonidine  1 drop Left Eye Q12H  .  ceFAZolin (ANCEF) IV  1 g Intravenous Q8H  . docusate sodium  100 mg Oral Daily  . dorzolamide  1 drop Left Eye BID  . famotidine  20 mg Oral Daily  . ferrous sulfate  325 mg Oral Q breakfast  . furosemide  40 mg Oral QPM  . furosemide  80  mg Oral Daily  . insulin aspart  0-15 Units Subcutaneous TID WC  . metoprolol tartrate  50 mg Oral BID  . mometasone-formoterol  2 puff Inhalation BID  . multivitamin with minerals  1 tablet Oral Daily  . rivaroxaban  15 mg Oral Daily  . spironolactone  25 mg Oral Daily   Continuous Infusions: . sodium chloride     PRN Meds:.acetaminophen **OR** acetaminophen, albuterol, alum & mag hydroxide-simeth, guaiFENesin-dextromethorphan, hydrALAZINE, HYDROmorphone (DILAUDID) injection, labetalol, metoprolol, ondansetron **OR** ondansetron (ZOFRAN) IV, phenol, polyethylene glycol  Micro Results Recent Results (from the past 240 hour(s))  Blood Culture (routine x 2)     Status: Abnormal   Collection Time: 01/26/16  5:58 PM  Result Value Ref Range Status   Specimen Description BLOOD WRIST LEFT  Final   Special Requests BOTTLES DRAWN AEROBIC ONLY 6CC  Final   Culture  Setup Time   Final    GRAM POSITIVE COCCI IN CLUSTERS AEROBIC BOTTLE ONLY CRITICAL RESULT CALLED TO, READ BACK BY AND VERIFIED WITH: R RUMBARGER,PHARMD AT 0957 01/27/16    Culture STAPHYLOCOCCUS AUREUS (A)  Final   Report Status 01/29/2016 FINAL  Final   Organism ID, Bacteria STAPHYLOCOCCUS AUREUS  Final      Susceptibility   Staphylococcus aureus - MIC*    CIPROFLOXACIN <=0.5 SENSITIVE Sensitive     ERYTHROMYCIN <=0.25 SENSITIVE Sensitive     GENTAMICIN <=0.5 SENSITIVE Sensitive     OXACILLIN 0.5 SENSITIVE Sensitive     TETRACYCLINE <=1 SENSITIVE Sensitive     VANCOMYCIN <=0.5 SENSITIVE Sensitive     TRIMETH/SULFA <=10 SENSITIVE Sensitive     CLINDAMYCIN <=0.25 SENSITIVE Sensitive     RIFAMPIN <=0.5 SENSITIVE Sensitive     Inducible Clindamycin NEGATIVE Sensitive     * STAPHYLOCOCCUS AUREUS  Blood Culture ID Panel (Reflexed)     Status: Abnormal   Collection Time: 01/26/16  5:58 PM  Result Value Ref Range Status   Enterococcus species NOT DETECTED NOT DETECTED Final   Vancomycin resistance NOT DETECTED NOT DETECTED Final    Listeria monocytogenes NOT DETECTED NOT DETECTED Final   Staphylococcus species DETECTED (A) NOT DETECTED Final    Comment: CRITICAL RESULT CALLED TO, READ BACK BY AND VERIFIED WITH: R RUMBARGER,PHARMD AT 0957 01/27/16    Staphylococcus aureus DETECTED (A) NOT DETECTED Final    Comment: CRITICAL RESULT CALLED TO, READ BACK BY AND VERIFIED WITH: R RUMBARGER,PHARMD AT 0957 01/27/16    Methicillin resistance NOT DETECTED NOT DETECTED Final   Streptococcus species NOT DETECTED NOT DETECTED Final   Streptococcus agalactiae NOT DETECTED NOT DETECTED Final   Streptococcus pneumoniae NOT DETECTED NOT DETECTED Final   Streptococcus pyogenes NOT DETECTED NOT DETECTED Final   Acinetobacter baumannii NOT DETECTED NOT DETECTED Final   Enterobacteriaceae species NOT DETECTED NOT DETECTED Final   Enterobacter cloacae complex NOT DETECTED NOT DETECTED Final   Escherichia coli NOT DETECTED NOT DETECTED Final   Klebsiella oxytoca NOT DETECTED NOT DETECTED Final   Klebsiella pneumoniae NOT DETECTED NOT DETECTED Final   Proteus species NOT DETECTED NOT DETECTED Final   Serratia marcescens NOT DETECTED NOT DETECTED Final   Carbapenem resistance NOT DETECTED NOT DETECTED Final   Haemophilus influenzae NOT DETECTED NOT DETECTED Final   Neisseria meningitidis NOT DETECTED NOT DETECTED Final   Pseudomonas aeruginosa NOT DETECTED NOT DETECTED Final   Candida albicans NOT DETECTED NOT DETECTED Final   Candida glabrata NOT DETECTED NOT DETECTED Final   Candida krusei NOT DETECTED NOT DETECTED Final   Candida parapsilosis NOT DETECTED NOT DETECTED Final   Candida tropicalis NOT DETECTED NOT DETECTED Final  Blood Culture (routine x 2)     Status: Abnormal   Collection Time: 01/26/16  6:01 PM  Result Value Ref Range Status   Specimen Description BLOOD RIGHT HAND  Final   Special Requests BOTTLES DRAWN AEROBIC AND ANAEROBIC 5CC  Final   Culture  Setup Time   Final    GRAM POSITIVE COCCI IN CLUSTERS IN BOTH  AEROBIC AND ANAEROBIC BOTTLES CRITICAL RESULT CALLED TO, READ BACK BY AND VERIFIED WITH: R RUMBARGER,PHARMD AT 4098  01/27/16    Culture (A)  Final    STAPHYLOCOCCUS AUREUS SUSCEPTIBILITIES PERFORMED ON PREVIOUS CULTURE WITHIN THE LAST 5 DAYS.    Report Status 01/29/2016 FINAL  Final  Urine culture     Status: Abnormal   Collection Time: 01/27/16  1:15 AM  Result Value Ref Range Status   Specimen Description URINE, RANDOM  Final   Special Requests NONE  Final   Culture MULTIPLE SPECIES PRESENT, SUGGEST RECOLLECTION (A)  Final   Report Status 01/28/2016 FINAL  Final  Blood Cultures x 2 sites     Status: None (Preliminary result)   Collection Time: 01/27/16  4:04 PM  Result Value Ref Range Status   Specimen Description BLOOD RIGHT ANTECUBITAL  Final   Special Requests BOTTLES DRAWN AEROBIC ONLY 5CC  Final   Culture NO GROWTH 4 DAYS  Final   Report Status PENDING  Incomplete  Blood Cultures x 2 sites     Status: Abnormal   Collection Time: 01/27/16  4:15 PM  Result Value Ref Range Status   Specimen Description BLOOD RIGHT WRIST  Final   Special Requests IN PEDIATRIC BOTTLE 2CC  Final   Culture  Setup Time   Final    GRAM POSITIVE COCCI IN CLUSTERS IN PEDIATRIC BOTTLE CRITICAL RESULT CALLED TO, READ BACK BY AND VERIFIED WITH: Celedonio Miyamoto  [id number] AT 0846 S.YARBROUGH    Culture (A)  Final    STAPHYLOCOCCUS AUREUS SUSCEPTIBILITIES PERFORMED ON PREVIOUS CULTURE WITHIN THE LAST 5 DAYS.    Report Status 01/30/2016 FINAL  Final  Culture, blood (routine x 2)     Status: None (Preliminary result)   Collection Time: 01/30/16  6:20 AM  Result Value Ref Range Status   Specimen Description BLOOD RIGHT ANTECUBITAL  Final   Special Requests IN PEDIATRIC BOTTLE  3CC  Final   Culture NO GROWTH 1 DAY  Final   Report Status PENDING  Incomplete  Culture, blood (routine x 2)     Status: None (Preliminary result)   Collection Time: 01/30/16  6:25 AM  Result Value Ref Range Status   Specimen  Description BLOOD RIGHT FOREARM  Final   Special Requests IN PEDIATRIC BOTTLE  3CC  Final   Culture NO GROWTH 1 DAY  Final   Report Status PENDING  Incomplete    Radiology Reports Dg Chest 2 View  01/26/2016  CLINICAL DATA:  Sepsis. EXAM: CHEST  2 VIEW COMPARISON:  Chest radiographs dated 11/24/2014 and chest CT dated 05/03/2015. FINDINGS: Stable enlarged cardiac silhouette and left basilar scarring. The trachea remains deviated to the right at the thoracic inlet. Diffuse osteopenia. IMPRESSION: 1. No acute abnormality. 2. Stable cardiomegaly and left basilar scarring. 3. Stable tracheal deviation to the right at the level of the thoracic inlet due to a previously demonstrated large thyroid nodule. Electronically Signed   By: Beckie Salts M.D.   On: 01/26/2016 19:39   Mr Foot Right W Wo Contrast  01/27/2016  CLINICAL DATA:  Foot pain with draining wounds.  Heel pain. EXAM: MRI OF THE RIGHT FOREFOOT WITHOUT AND WITH CONTRAST TECHNIQUE: Multiplanar, multisequence MR imaging was performed both before and after administration of intravenous contrast. CONTRAST:  7mL MULTIHANCE GADOBENATE DIMEGLUMINE 529 MG/ML IV SOLN COMPARISON:  01/26/2016 FINDINGS: Osteomyelitis protocol MRI of the foot was obtained, to include the entire foot and ankle. This protocol uses a large field of view to cover the entire foot and ankle, and is suitable for assessing bony structures for osteomyelitis. Due to the  large field of view and imaging plane choice, this protocol is less sensitive for assessing small structures such as ligamentous structures of the foot and ankle, compared to a dedicated forefoot or dedicated hindfoot exam. Despite efforts by the technologist and patient, motion artifact is present on today's exam and could not be eliminated. This reduces exam sensitivity and specificity. There is diffuse subcutaneous edema in the ankle, heel, and foot. In the head of the first metatarsal there is an erosive lesion dorsum  medially. Reportedly this is in the vicinity of a skin wound, although the wound is poorly seen on today's MRI. Differential diagnostic considerations include early osteomyelitis versus gout arthropathy. No other osseous edema or significant abnormal osseous enhancement observed to suggest osteomyelitis elsewhere. Incidental note is made of a degenerative subcortical cyst or geode distally in the middle cuneiform. No malalignment at the Lisfranc joint. Thickened plantar fascia raises possibility plantar fasciitis. No drainable abscess. No significant joint effusion to further suggest septic joint. IMPRESSION: 1. Erosive lesion along the dorsal -medial head of the first metatarsal with associated low-level edema and enhancement. This resembles a gout lesion or erosive arthropathy, but by report there is a draining wound in this vicinity which increases the likelihood that this could represent localized early osteomyelitis. 2. Diffuse soft tissue edema in the ankle and foot, nonspecific for bland edema versus cellulitis. 3. Thickened plantar fascia suggesting plantar fasciitis. 4. This final interpretation correlates with the preliminary report issued by Dr. Cherly Hensen. Electronically Signed   By: Gaylyn Rong M.D.   On: 01/27/2016 13:32   Dg Foot Complete Right  01/26/2016  CLINICAL DATA:  Right foot wounds, diabetes EXAM: RIGHT FOOT COMPLETE - 3+ VIEW COMPARISON:  12/09/2013 FINDINGS: Three views of the right foot submitted. There is diffuse osteopenia. Mild soft tissue swelling adjacent to distal aspect of first metatarsal. No acute fracture or subluxation. Atherosclerotic vascular calcifications are noted. No bone destruction to suggest osteomyelitis. There is plantar spur of calcaneus. Dorsal spurring anterior aspect of the talus. IMPRESSION: No acute fracture or subluxation. Limited study by diffuse osteopenia. Mild soft tissue swelling adjacent to distal aspect of first metatarsal. No evidence of bone  destruction to suggest osteomyelitis. Plantar spur of calcaneus. Degenerative changes with dorsal spurring anterior aspect of the talus. Electronically Signed   By: Natasha Mead M.D.   On: 01/26/2016 17:00    Time Spent in minutes  25   Eddie North M.D on 02/01/2016 at 1:58 PM  Between 7am to 7pm - Pager - [phone]  After 7pm go to [email or web address] - password [facility]  [facility] -  Office  [phone]

## 2016-02-01 NOTE — Progress Notes (Signed)
After assessing patient, Dr. Rennis Golden spoke with Dr. Drue Second about medical necessity of TEE procedure.  Decision made to cancel procedure.  Report called to bedside RN, stated she would notify family.  Pt transferred back to 6E24.  Roselie Awkward, RN

## 2016-02-01 NOTE — H&P (Signed)
     INTERVAL PROCEDURE H&P  History and Physical Interval Note:  02/01/2016 2:39 PM  [name] has presented today for their planned procedure. The various methods of treatment have been discussed with the patient and family. After consideration of risks, benefits and other options for treatment, the patient has consented to the procedure.  The patients' outpatient history has been reviewed, patient examined, and no change in status from most recent office note within the past 30 days. I have reviewed the patients' chart and labs and will proceed as planned. Questions were answered to the patient's satisfaction.   Chrystie Nose, MD, [facility] Attending Cardiologist [facility]  Chrystie Nose 02/01/2016, 2:39 PM

## 2016-02-01 NOTE — Progress Notes (Signed)
Discussed case with Dr. Drue Second - she is very frail, hypotensive and a poor candidate for TEE. She likely has osteomyelitis and will receive a 4-6 week course of antibiotics anyway, which would cover a full course of treatment for possible endocarditis. I do not believe a TEE will change her course of therapy and the risk of the procedure outweighs the benefit in this situation. The case was cancelled.  Chrystie Nose, MD, [facility] Attending Cardiologist [facility]

## 2016-02-02 ENCOUNTER — Telehealth: Payer: Self-pay | Admitting: Surgery

## 2016-02-02 ENCOUNTER — Inpatient Hospital Stay (HOSPITAL_COMMUNITY): Payer: Medicare Other

## 2016-02-02 LAB — BASIC METABOLIC PANEL
Anion gap: 9 (ref 5–15)
BUN: 21 mg/dL — AB (ref 6–20)
CHLORIDE: 97 mmol/L — AB (ref 101–111)
CO2: 28 mmol/L (ref 22–32)
CREATININE: 0.91 mg/dL (ref 0.44–1.00)
Calcium: 8.6 mg/dL — ABNORMAL LOW (ref 8.9–10.3)
GFR calc Af Amer: >60 mL/min (ref >60)
GFR calc non Af Amer: 55 mL/min — ABNORMAL LOW (ref >60)
GLUCOSE: 132 mg/dL — AB (ref 65–99)
Potassium: 4.2 mmol/L (ref 3.5–5.1)
SODIUM: 134 mmol/L — AB (ref 135–145)

## 2016-02-02 LAB — CBC
HCT: 39.5 % (ref 36.0–46.0)
Hemoglobin: 12.1 g/dL (ref 12.0–15.0)
MCH: 29.1 pg (ref 26.0–34.0)
MCHC: 30.6 g/dL (ref 30.0–36.0)
MCV: 95 fL (ref 78.0–100.0)
Platelets: 355 K/uL (ref 150–400)
RBC: 4.16 MIL/uL (ref 3.87–5.11)
RDW: 14.5 % (ref 11.5–15.5)
WBC: 6.2 K/uL (ref 4.0–10.5)

## 2016-02-02 LAB — GLUCOSE, CAPILLARY
Glucose-Capillary: 166 mg/dL — ABNORMAL HIGH (ref 65–99)
Glucose-Capillary: 237 mg/dL — ABNORMAL HIGH (ref 65–99)
Glucose-Capillary: 65 mg/dL (ref 65–99)
Glucose-Capillary: 70 mg/dL (ref 65–99)
Glucose-Capillary: 216 mg/dL — ABNORMAL HIGH (ref 65–99)

## 2016-02-02 MED ORDER — SODIUM CHLORIDE 0.9% FLUSH
10.0000 mL | INTRAVENOUS | Status: DC | PRN
  Administered 2016-02-02 – 2016-02-03 (×3): 10 mL
  Filled 2016-02-02 (×3): qty 40

## 2016-02-02 NOTE — Telephone Encounter (Signed)
Sched lab 7/13 at 4 and NP 7/18 at 11:45. Lm on hm# to inform pt.

## 2016-02-02 NOTE — Progress Notes (Signed)
Peripherally Inserted Central Catheter/Midline Placement  The IV Nurse has discussed with the patient and/or persons authorized to consent for the patient, the purpose of this procedure and the potential benefits and risks involved with this procedure.  The benefits include less needle sticks, lab draws from the catheter, potential for PICC exchange if ordered by physiscian and patient may be discharged home with the catheter.  Risks include, but not limited to, infection, bleeding, blood clot (thrombus formation), and puncture of an artery; nerve damage and irregular heat beat.  Alternatives to this procedure were also discussed. [employer] educational packet and information card left at bedside.  Dtr gave consent via telephone.  PICC/Midline Placement Documentation  PICC Single Lumen 02/02/16 PICC Right Brachial 35 cm 0 cm (Active)  Indication for Insertion or Continuance of Line Home intravenous therapies (PICC only) 02/02/2016 10:00 AM  Exposed Catheter (cm) 0 cm 02/02/2016 10:00 AM  Site Assessment Clean;Dry;Intact 02/02/2016 10:00 AM  Line Status Flushed;Saline locked;Blood return noted 02/02/2016 10:00 AM  Dressing Type Transparent 02/02/2016 10:00 AM  Dressing Status Clean;Dry;Intact;Antimicrobial disc in place 02/02/2016 10:00 AM  Line Care Connections checked and tightened 02/02/2016 10:00 AM  Line Adjustment (NICU/IV Team Only) No 02/02/2016 10:00 AM  Dressing Intervention New dressing 02/02/2016 10:00 AM  Dressing Change Due 02/09/16 02/02/2016 10:00 AM       Elliot Dally 02/02/2016, 10:59 AM

## 2016-02-02 NOTE — Telephone Encounter (Signed)
-----   Message from Sharee Pimple, RN sent at 01/31/2016 10:17 AM EDT ----- Regarding: schedule   ----- Message -----    From: Raymond Gurney, PA-C    Sent: 01/31/2016   9:55 AM      To: [facility] Charge Pool  S/p lower extremity arteriogram and angioplasty right PT artery by Dr. Myra Gianotti on 01/30/16 for right leg ulcer.  F/u in 4 weeks with Rosalita Chessman with RLE arterial duplex.   Thanks Selena Batten

## 2016-02-02 NOTE — Progress Notes (Signed)
PROGRESS NOTE                                                                                                                                                                                                             Patient Demographics:    Audryanna Zurita, is a 82 y.o. female, DOB - 1926-05-10, ZOX:[id number]  Admit date - 01/26/2016   Admitting Physician Haydee Salter, MD  Outpatient Primary MD for the patient is Gust Rung, DO  LOS - 6  Outpatient Specialists: Heart failure clinic  Dr Imogene Burn  Chief Complaint  Patient presents with  . Foot Pain       Brief Narrative   82 year old female with history of A. fib on anticoagulation, hypertension, Churg-Strauss syndrome, combined systolic and diastolic CHF, diabetes mellitus presented with diabetic foot infection. Patient injured her right foot about 2 weeks prior to admission from the metal frame on the bottom of the walker. Patient was tachycardic with low-grade fever and elevated lactic acid of 3 on presentation and started on empiric antibiotics. Blood cultures grew MSSA and antibiotic was narrowed to IV cefazolin. MRI of the right foot was concerning for osteomyelitis. Vascular surgery was consulted for nonhealing wound in her right foot with decreased ABIs. Patient underwent lower extremity angiogram showing occlusion of all 3 tibial vessels with recanalization of occluded posterior tibial artery and subsequent balloon angioplasty.   Subjective:   No overnight issues.TEE aborted since patient quite frail and decided to treat empirically with 6-8 weeks of antibiotics.   Assessment  & Plan :    Sepsis With MSSA bacteremia On empiric cefazolin. Source is likely foot infection. Repeat blood 6/14 negative.  Placed today with plan on 6 weeks of antibiotics per ID., stop date 03/08/2016 2-D echo done on 6/13 shows no vegetation. TEE not done patient quite frail and  hypertensive. Plan to treat empirically.  Peripheral vascular disease with ischemic foot changes -Lower extremity angiogram with recanalization of occluded posterior tibial artery. Tolerated procedure well. -Wound care following for decubitus heel ulcer. -MRI foot shows erosive lesion medial head first metatarsal with low-level edema and enhancement; diffuse soft tissue edema of the ankle and foot. Suspect osteomyelitis.  diabetes mellitus type 2 with renal manifestations -Continue NovoLog sliding scale -01/27/2016 hemoglobin A1c 7.9 -Holding home metformin  Chronic systolic CHF/NICM -09/21/2014 echo EF  20-25%, severe diffuse HK, severe TR, severe RV dyfx -Continue furosemide , ACEi ,BB and aldactone   Chronic Atrial Fibrillation -CHADS2Vasc of 6,on xarelto -On 01/29/16-Developed RVR (metoprolol was held to soft blood pressure the previous day.). Required Cardizem drip briefly. . Now stable after metoprolol dose increased. -Continue Xarelto.   Protein calorie malnutrition Added supplement.   Code Status :Full code  Family Communication  : None at bedside   Disposition Plan  : Needs SNF. Social work consulted. Patient ready for discharge today  Barriers For Discharge : Awaiting SNF  Consults  :  ID Vascular surgery Cardiology for TEE  Procedures  :  MRI right foot 2-D echo Lower extremity angiogram TEE  DVT Prophylaxis  : Xarelto  Lab Results  Component Value Date   PLT 355 02/02/2016    Antibiotics  :    Anti-infectives    Start     Dose/Rate Route Frequency Ordered Stop   01/27/16 1700  vancomycin (VANCOCIN) IVPB 750 mg/150 ml premix  Status:  Discontinued     750 mg 150 mL/hr over 60 Minutes Intravenous Every 24 hours 01/26/16 1659 01/27/16 1253   01/27/16 1400  ceFAZolin (ANCEF) IVPB 1 g/50 mL premix     1 g 100 mL/hr over 30 Minutes Intravenous Every 8 hours 01/27/16 1333     01/26/16 1700  piperacillin-tazobactam (ZOSYN) IVPB 3.375 g  Status:   Discontinued     3.375 g 100 mL/hr over 30 Minutes Intravenous  Once 01/26/16 1655 01/26/16 1658   01/26/16 1700  vancomycin (VANCOCIN) IVPB 1000 mg/200 mL premix     1,000 mg 200 mL/hr over 60 Minutes Intravenous  Once 01/26/16 1655 01/26/16 1931   01/26/16 1700  piperacillin-tazobactam (ZOSYN) IVPB 3.375 g  Status:  Discontinued     3.375 g 12.5 mL/hr over 240 Minutes Intravenous Every 8 hours 01/26/16 1658 01/27/16 1333        Objective:   Filed Vitals:   02/01/16 2050 02/01/16 2105 02/02/16 0430 02/02/16 0947  BP: 112/63  111/59 119/61  Pulse: 51 147 90 90  Temp: 98.8 F (37.1 C)  98.5 F (36.9 C) 98.2 F (36.8 C)  TempSrc: Oral  Oral Oral  Resp: 20  20 18   Height:      Weight: 60.1 kg (132 lb 7.9 oz)     SpO2: 98%  100% 98%    Wt Readings from Last 3 Encounters:  02/01/16 60.1 kg (132 lb 7.9 oz)  01/22/16 67.813 kg (149 lb 8 oz)  09/14/15 73.664 kg (162 lb 6.4 oz)     Intake/Output Summary (Last 24 hours) at 02/02/16 1543 Last data filed at 02/02/16 1300  Gross per 24 hour  Intake   1810 ml  Output      0 ml  Net   1810 ml     Physical Exam  Gen: not in distress HEENT:  moist mucosa, supple neck Chest: clear b/l, no added sounds CVS: S1 and S2 irregularly irregular, no murmurs, rubs or gallop GI: soft, NT, ND, BS+ Musculoskeletal: warm, no edema, dressing over rt leg,, PICC line  right upper extremity CNS: AAOX2, non focal    Data Review:    CBC  Recent Labs Lab 01/26/16 1609  01/29/16 0331 01/30/16 0608 01/31/16 0524 02/01/16 0655 02/02/16 0500  WBC 6.7  < > 7.6 7.6 5.9 5.5 6.2  HGB 13.6  < > 11.3* 12.1 11.6* 13.8 12.1  HCT 42.3  < > 35.4* 38.8 38.2 43.0 39.5  PLT 363  < > 323 337 328 300 355  MCV 94.2  < > 92.7 94.6 94.1 92.7 95.0  MCH 30.3  < > 29.6 29.5 28.6 29.7 29.1  MCHC 32.2  < > 31.9 31.2 30.4 32.1 30.6  RDW 14.8  < > 14.6 14.6 14.6 14.6 14.5  LYMPHSABS 0.9  --   --   --   --   --   --   MONOABS 0.8  --   --   --   --   --    --   EOSABS 0.1  --   --   --   --   --   --   BASOSABS 0.0  --   --   --   --   --   --   < > = values in this interval not displayed.  Chemistries   Recent Labs Lab 01/26/16 1609 01/27/16 0452 01/28/16 0416 01/29/16 0331 02/01/16 0655 02/02/16 0500  NA 137 138 136  --  137 134*  K 4.2 3.7 3.5  --  5.2* 4.2  CL 97* 98* 96*  --  96* 97*  CO2 --  28 28  GLUCOSE 160* 124* 123*  --  140* 132*  BUN --  14 21*  CREATININE 1.22* 1.16* 0.95  --  0.89 0.91  CALCIUM 8.3* 8.0* 7.5*  --  9.0 8.6*  MG  --   --   --  1.7  --   --   AST 17  --   --   --   --   --   ALT 7*  --   --   --   --   --   ALKPHOS 122  --   --   --   --   --   BILITOT 1.4*  --   --   --   --   --    ------------------------------------------------------------------------------------------------------------------ No results for input(s): CHOL, HDL, LDLCALC, TRIG, CHOLHDL, LDLDIRECT in the last 72 hours.  Lab Results  Component Value Date   HGBA1C 7.9* 01/27/2016   ------------------------------------------------------------------------------------------------------------------ No results for input(s): TSH, T4TOTAL, T3FREE, THYROIDAB in the last 72 hours.  Invalid input(s): FREET3 ------------------------------------------------------------------------------------------------------------------ No results for input(s): VITAMINB12, FOLATE, FERRITIN, TIBC, IRON, RETICCTPCT in the last 72 hours.  Coagulation profile No results for input(s): INR, PROTIME in the last 168 hours.  No results for input(s): DDIMER in the last 72 hours.  Cardiac Enzymes No results for input(s): CKMB, TROPONINI, MYOGLOBIN in the last 168 hours.  Invalid input(s): CK ------------------------------------------------------------------------------------------------------------------    Component Value Date/Time   BNP 805.5* 01/22/2016 1616   BNP 1069.1* 08/01/2014 1635    Inpatient Medications  Scheduled  Meds: . brimonidine  1 drop Left Eye Q12H  .  ceFAZolin (ANCEF) IV  1 g Intravenous Q8H  . docusate sodium  100 mg Oral Daily  . dorzolamide  1 drop Left Eye BID  . famotidine  20 mg Oral Daily  . feeding supplement (PRO-STAT SUGAR FREE 64)  30 mL Oral Q1500  . ferrous sulfate  325 mg Oral Q breakfast  . furosemide  40 mg Oral QPM  . furosemide  80 mg Oral Daily  . insulin aspart  0-15 Units Subcutaneous TID WC  . insulin aspart  0-5 Units Subcutaneous QHS  . metoprolol tartrate  50 mg Oral BID  . mometasone-formoterol  2 puff Inhalation BID  . multivitamin with  minerals  1 tablet Oral Daily  . rivaroxaban  15 mg Oral Daily  . spironolactone  25 mg Oral Daily   Continuous Infusions:   PRN Meds:.acetaminophen **OR** acetaminophen, albuterol, alum & mag hydroxide-simeth, guaiFENesin-dextromethorphan, hydrALAZINE, HYDROmorphone (DILAUDID) injection, labetalol, metoprolol, ondansetron **OR** ondansetron (ZOFRAN) IV, phenol, polyethylene glycol, sodium chloride flush  Micro Results Recent Results (from the past 240 hour(s))  Blood Culture (routine x 2)     Status: Abnormal   Collection Time: 01/26/16  5:58 PM  Result Value Ref Range Status   Specimen Description BLOOD WRIST LEFT  Final   Special Requests BOTTLES DRAWN AEROBIC ONLY 6CC  Final   Culture  Setup Time   Final    GRAM POSITIVE COCCI IN CLUSTERS AEROBIC BOTTLE ONLY CRITICAL RESULT CALLED TO, READ BACK BY AND VERIFIED WITH: R RUMBARGER,PHARMD AT 0957 01/27/16    Culture STAPHYLOCOCCUS AUREUS (A)  Final   Report Status 01/29/2016 FINAL  Final   Organism ID, Bacteria STAPHYLOCOCCUS AUREUS  Final      Susceptibility   Staphylococcus aureus - MIC*    CIPROFLOXACIN <=0.5 SENSITIVE Sensitive     ERYTHROMYCIN <=0.25 SENSITIVE Sensitive     GENTAMICIN <=0.5 SENSITIVE Sensitive     OXACILLIN 0.5 SENSITIVE Sensitive     TETRACYCLINE <=1 SENSITIVE Sensitive     VANCOMYCIN <=0.5 SENSITIVE Sensitive     TRIMETH/SULFA <=10 SENSITIVE  Sensitive     CLINDAMYCIN <=0.25 SENSITIVE Sensitive     RIFAMPIN <=0.5 SENSITIVE Sensitive     Inducible Clindamycin NEGATIVE Sensitive     * STAPHYLOCOCCUS AUREUS  Blood Culture ID Panel (Reflexed)     Status: Abnormal   Collection Time: 01/26/16  5:58 PM  Result Value Ref Range Status   Enterococcus species NOT DETECTED NOT DETECTED Final   Vancomycin resistance NOT DETECTED NOT DETECTED Final   Listeria monocytogenes NOT DETECTED NOT DETECTED Final   Staphylococcus species DETECTED (A) NOT DETECTED Final    Comment: CRITICAL RESULT CALLED TO, READ BACK BY AND VERIFIED WITH: R RUMBARGER,PHARMD AT 0957 01/27/16    Staphylococcus aureus DETECTED (A) NOT DETECTED Final    Comment: CRITICAL RESULT CALLED TO, READ BACK BY AND VERIFIED WITH: R RUMBARGER,PHARMD AT 0957 01/27/16    Methicillin resistance NOT DETECTED NOT DETECTED Final   Streptococcus species NOT DETECTED NOT DETECTED Final   Streptococcus agalactiae NOT DETECTED NOT DETECTED Final   Streptococcus pneumoniae NOT DETECTED NOT DETECTED Final   Streptococcus pyogenes NOT DETECTED NOT DETECTED Final   Acinetobacter baumannii NOT DETECTED NOT DETECTED Final   Enterobacteriaceae species NOT DETECTED NOT DETECTED Final   Enterobacter cloacae complex NOT DETECTED NOT DETECTED Final   Escherichia coli NOT DETECTED NOT DETECTED Final   Klebsiella oxytoca NOT DETECTED NOT DETECTED Final   Klebsiella pneumoniae NOT DETECTED NOT DETECTED Final   Proteus species NOT DETECTED NOT DETECTED Final   Serratia marcescens NOT DETECTED NOT DETECTED Final   Carbapenem resistance NOT DETECTED NOT DETECTED Final   Haemophilus influenzae NOT DETECTED NOT DETECTED Final   Neisseria meningitidis NOT DETECTED NOT DETECTED Final   Pseudomonas aeruginosa NOT DETECTED NOT DETECTED Final   Candida albicans NOT DETECTED NOT DETECTED Final   Candida glabrata NOT DETECTED NOT DETECTED Final   Candida krusei NOT DETECTED NOT DETECTED Final   Candida  parapsilosis NOT DETECTED NOT DETECTED Final   Candida tropicalis NOT DETECTED NOT DETECTED Final  Blood Culture (routine x 2)     Status: Abnormal   Collection Time: 01/26/16  6:01 PM  Result Value Ref Range Status   Specimen Description BLOOD RIGHT HAND  Final   Special Requests BOTTLES DRAWN AEROBIC AND ANAEROBIC 5CC  Final   Culture  Setup Time   Final    GRAM POSITIVE COCCI IN CLUSTERS IN BOTH AEROBIC AND ANAEROBIC BOTTLES CRITICAL RESULT CALLED TO, READ BACK BY AND VERIFIED WITH: R RUMBARGER,PHARMD AT 0957 01/27/16    Culture (A)  Final    STAPHYLOCOCCUS AUREUS SUSCEPTIBILITIES PERFORMED ON PREVIOUS CULTURE WITHIN THE LAST 5 DAYS.    Report Status 01/29/2016 FINAL  Final  Urine culture     Status: Abnormal   Collection Time: 01/27/16  1:15 AM  Result Value Ref Range Status   Specimen Description URINE, RANDOM  Final   Special Requests NONE  Final   Culture MULTIPLE SPECIES PRESENT, SUGGEST RECOLLECTION (A)  Final   Report Status 01/28/2016 FINAL  Final  Blood Cultures x 2 sites     Status: None   Collection Time: 01/27/16  4:04 PM  Result Value Ref Range Status   Specimen Description BLOOD RIGHT ANTECUBITAL  Final   Special Requests BOTTLES DRAWN AEROBIC ONLY 5CC  Final   Culture NO GROWTH 5 DAYS  Final   Report Status 02/01/2016 FINAL  Final  Blood Cultures x 2 sites     Status: Abnormal   Collection Time: 01/27/16  4:15 PM  Result Value Ref Range Status   Specimen Description BLOOD RIGHT WRIST  Final   Special Requests IN PEDIATRIC BOTTLE 2CC  Final   Culture  Setup Time   Final    GRAM POSITIVE COCCI IN CLUSTERS IN PEDIATRIC BOTTLE CRITICAL RESULT CALLED TO, READ BACK BY AND VERIFIED WITH: Celedonio Miyamoto  [id number] AT 0846 S.YARBROUGH    Culture (A)  Final    STAPHYLOCOCCUS AUREUS SUSCEPTIBILITIES PERFORMED ON PREVIOUS CULTURE WITHIN THE LAST 5 DAYS.    Report Status 01/30/2016 FINAL  Final  Culture, blood (routine x 2)     Status: None (Preliminary result)    Collection Time: 01/30/16  6:20 AM  Result Value Ref Range Status   Specimen Description BLOOD RIGHT ANTECUBITAL  Final   Special Requests IN PEDIATRIC BOTTLE  3CC  Final   Culture NO GROWTH 2 DAYS  Final   Report Status PENDING  Incomplete  Culture, blood (routine x 2)     Status: None (Preliminary result)   Collection Time: 01/30/16  6:25 AM  Result Value Ref Range Status   Specimen Description BLOOD RIGHT FOREARM  Final   Special Requests IN PEDIATRIC BOTTLE  3CC  Final   Culture NO GROWTH 2 DAYS  Final   Report Status PENDING  Incomplete    Radiology Reports Dg Chest 2 View  01/26/2016  CLINICAL DATA:  Sepsis. EXAM: CHEST  2 VIEW COMPARISON:  Chest radiographs dated 11/24/2014 and chest CT dated 05/03/2015. FINDINGS: Stable enlarged cardiac silhouette and left basilar scarring. The trachea remains deviated to the right at the thoracic inlet. Diffuse osteopenia. IMPRESSION: 1. No acute abnormality. 2. Stable cardiomegaly and left basilar scarring. 3. Stable tracheal deviation to the right at the level of the thoracic inlet due to a previously demonstrated large thyroid nodule. Electronically Signed   By: Beckie Salts M.D.   On: 01/26/2016 19:39   Mr Foot Right W Wo Contrast  01/27/2016  CLINICAL DATA:  Foot pain with draining wounds.  Heel pain. EXAM: MRI OF THE RIGHT FOREFOOT WITHOUT AND WITH CONTRAST TECHNIQUE: Multiplanar, multisequence MR imaging was  performed both before and after administration of intravenous contrast. CONTRAST:  7mL MULTIHANCE GADOBENATE DIMEGLUMINE 529 MG/ML IV SOLN COMPARISON:  01/26/2016 FINDINGS: Osteomyelitis protocol MRI of the foot was obtained, to include the entire foot and ankle. This protocol uses a large field of view to cover the entire foot and ankle, and is suitable for assessing bony structures for osteomyelitis. Due to the large field of view and imaging plane choice, this protocol is less sensitive for assessing small structures such as ligamentous  structures of the foot and ankle, compared to a dedicated forefoot or dedicated hindfoot exam. Despite efforts by the technologist and patient, motion artifact is present on today's exam and could not be eliminated. This reduces exam sensitivity and specificity. There is diffuse subcutaneous edema in the ankle, heel, and foot. In the head of the first metatarsal there is an erosive lesion dorsum medially. Reportedly this is in the vicinity of a skin wound, although the wound is poorly seen on today's MRI. Differential diagnostic considerations include early osteomyelitis versus gout arthropathy. No other osseous edema or significant abnormal osseous enhancement observed to suggest osteomyelitis elsewhere. Incidental note is made of a degenerative subcortical cyst or geode distally in the middle cuneiform. No malalignment at the Lisfranc joint. Thickened plantar fascia raises possibility plantar fasciitis. No drainable abscess. No significant joint effusion to further suggest septic joint. IMPRESSION: 1. Erosive lesion along the dorsal -medial head of the first metatarsal with associated low-level edema and enhancement. This resembles a gout lesion or erosive arthropathy, but by report there is a draining wound in this vicinity which increases the likelihood that this could represent localized early osteomyelitis. 2. Diffuse soft tissue edema in the ankle and foot, nonspecific for bland edema versus cellulitis. 3. Thickened plantar fascia suggesting plantar fasciitis. 4. This final interpretation correlates with the preliminary report issued by Dr. Cherly Hensen. Electronically Signed   By: Gaylyn Rong M.D.   On: 01/27/2016 13:32   Dg Chest Port 1 View  02/02/2016  CLINICAL DATA:  Patient status post PICC line placement. EXAM: PORTABLE CHEST 1 VIEW COMPARISON:  Chest radiograph 01/26/2016. FINDINGS: Interval insertion right upper extremity PICC line with tip projecting over the superior vena cava. Stable  cardiomegaly. Persistent small left pleural effusion and heterogeneous opacities left lung base. No pneumothorax. IMPRESSION: Right upper extremity PICC line tip projects over the superior vena cava. Persistent small left pleural effusion and underlying opacities. Electronically Signed   By: Annia Belt M.D.   On: 02/02/2016 11:49   Dg Foot Complete Right  01/26/2016  CLINICAL DATA:  Right foot wounds, diabetes EXAM: RIGHT FOOT COMPLETE - 3+ VIEW COMPARISON:  12/09/2013 FINDINGS: Three views of the right foot submitted. There is diffuse osteopenia. Mild soft tissue swelling adjacent to distal aspect of first metatarsal. No acute fracture or subluxation. Atherosclerotic vascular calcifications are noted. No bone destruction to suggest osteomyelitis. There is plantar spur of calcaneus. Dorsal spurring anterior aspect of the talus. IMPRESSION: No acute fracture or subluxation. Limited study by diffuse osteopenia. Mild soft tissue swelling adjacent to distal aspect of first metatarsal. No evidence of bone destruction to suggest osteomyelitis. Plantar spur of calcaneus. Degenerative changes with dorsal spurring anterior aspect of the talus. Electronically Signed   By: Natasha Mead M.D.   On: 01/26/2016 17:00    Time Spent in minutes  25   Eddie North M.D on 02/02/2016 at 3:43 PM  Between 7am to 7pm - Pager - [phone]  After 7pm go to [email or web address] -  password [facility]  [facility] -  Office  [phone]

## 2016-02-03 DIAGNOSIS — I739 Peripheral vascular disease, unspecified: Secondary | ICD-10-CM

## 2016-02-03 DIAGNOSIS — A4101 Sepsis due to Methicillin susceptible Staphylococcus aureus: Principal | ICD-10-CM

## 2016-02-03 DIAGNOSIS — E43 Unspecified severe protein-calorie malnutrition: Secondary | ICD-10-CM

## 2016-02-03 DIAGNOSIS — L8990 Pressure ulcer of unspecified site, unspecified stage: Secondary | ICD-10-CM

## 2016-02-03 LAB — GLUCOSE, CAPILLARY
GLUCOSE-CAPILLARY: 180 mg/dL — AB (ref 65–99)
GLUCOSE-CAPILLARY: 178 mg/dL — AB (ref 65–99)
GLUCOSE-CAPILLARY: 175 mg/dL — AB (ref 65–99)
Glucose-Capillary: 262 mg/dL — ABNORMAL HIGH (ref 65–99)

## 2016-02-03 LAB — CBC
HEMATOCRIT: 37.9 % (ref 36.0–46.0)
HEMOGLOBIN: 11.8 g/dL — AB (ref 12.0–15.0)
MCH: 28.7 pg (ref 26.0–34.0)
MCHC: 31.1 g/dL (ref 30.0–36.0)
MCV: 92.2 fL (ref 78.0–100.0)
Platelets: 364 10*3/uL (ref 150–400)
RBC: 4.11 MIL/uL (ref 3.87–5.11)
RDW: 14.3 % (ref 11.5–15.5)
WBC: 5.8 10*3/uL (ref 4.0–10.5)

## 2016-02-03 MED ORDER — PRO-STAT SUGAR FREE PO LIQD: 30.0000 mL | Freq: Every day | ORAL | Status: DC

## 2016-02-03 MED ORDER — METOPROLOL SUCCINATE ER 100 MG PO TB24: ORAL_TABLET | ORAL | Status: AC

## 2016-02-03 MED ORDER — CEFAZOLIN SODIUM 1-5 GM-% IV SOLN: 1.0000 g | Freq: Three times a day (TID) | INTRAVENOUS | Status: AC

## 2016-02-03 MED ORDER — HEPARIN SOD (PORK) LOCK FLUSH 100 UNIT/ML IV SOLN
250.0000 [IU] | INTRAVENOUS | Status: AC | PRN
  Administered 2016-02-03: 250 [IU]

## 2016-02-03 NOTE — Discharge Summary (Signed)
Physician Discharge Summary  Mikell Camp ZOX:[id number] DOB: 11/24/1925 DOA: 01/26/2016  PCP: Gust Rung, DO  Admit date: 01/26/2016 Discharge date: 02/03/2016  Admitted From: Home  Disposition:  Home with home health (lives with daughter)  Recommendations for Outpatient Follow-up:  Follow-up with PCP in 1 week. Stop date of IV antibiotics 03/09/2016. Please monitor weekly CBC while on antibiotics. Follow-up with vascular surgery in 4 weeks. Follow-up with ID (Dr Luciana Axe ) in 6 weeks  Home Health: RN and PT,   Equipment/Devices: None  Discharge Condition: Fair CODE STATUS: full  Diet recommendation: Heart Healthy / Carb Modified  Brief/Interim Summary: You may copy/paste interim summary or write brief hospital course depending on length of stay  Discharge Diagnoses:  Principal Problem:   Staphylococcus aureus bacteremia with sepsis (HCC)   Active Problems:   HYPERCHOLESTEROLEMIA   Glaucoma   Essential hypertension   GERD (gastroesophageal reflux disease)   Chronic combined systolic and diastolic congestive heart failure (HCC)   Chronic atrial fibrillation (HCC)   Asthma in adult   Severe protein-calorie malnutrition (HCC)   PAD (peripheral artery disease) (HCC)   Osteomyelitis (HCC)   Diabetic foot infection (HCC)   Sepsis (HCC)   Pressure ulcer   Acute osteomyelitis of right foot (HCC)   MSSA (methicillin susceptible Staphylococcus aureus) infection   Brief Narrative  82 year old female with history of A. fib on anticoagulation, hypertension, Churg-Strauss syndrome, combined systolic and diastolic CHF, diabetes mellitus presented with diabetic foot infection. Patient injured her right foot about 2 weeks prior to admission from the metal frame on the bottom of the walker. Patient was tachycardic with low-grade fever and elevated lactic acid of 3 on presentation and started on empiric antibiotics. Blood cultures grew MSSA and antibiotic was narrowed to IV cefazolin.  MRI of the right foot was concerning for osteomyelitis. Vascular surgery was consulted for nonhealing wound in her right foot with decreased ABIs. Patient underwent lower extremity angiogram showing occlusion of all 3 tibial vessels with recanalization of occluded posterior tibial artery and subsequent balloon angioplasty.   Hospital course Sepsis With MSSA bacteremia On empiric cefazolin (1 g every 8 hours). Source is likely foot infection. Repeat blood 6/14 negative.  Right upper extremity PICC line placed with with plan on 6 weeks of antibiotics per ID., stop date 03/09/2016 2-D echo done on 6/13 shows no vegetation. TEE not done since patient quite frail and hypotensive. Plan to treat empirically. Initially planned for discharge to skilled nursing facility as per PT recommendation however daughter refused to send her to SNF and wants to take her home with home health. We will arrange home health RN for prolonged IV antibiotic therapy, lab monitoring and home health physical therapy.  Peripheral vascular disease with ischemic foot changes -Lower extremity angiogram with recanalization of occluded posterior tibial artery. Tolerated procedure well. Denies any pain.  -Wound care altered for decubitus heel ulcer. -MRI foot shows erosive lesion medial head first metatarsal with low-level edema and enhancement; diffuse soft tissue edema of the ankle and foot. Suspect osteomyelitis. Plan on 6 weeks of IV antibiotics.  diabetes mellitus type 2 with renal manifestations hemoglobin A1c 7.9 -Resume metformin  Chronic systolic CHF/NICM -09/21/2014 echo EF 20-25%, severe diffuse HK, severe TR, severe RV dyfx -Continue furosemide , ACEi ,BB and aldactone   Chronic Atrial Fibrillation -CHADS2Vasc of 6,on xarelto -On 01/29/16-Developed RVR (metoprolol was held to soft blood pressure the previous day.). Required Cardizem drip briefly. . Now stable after metoprolol dose adjusted. Since blood  pressure is  soft and reduced her Toprol dose to once daily.    Protein calorie malnutrition Added supplement.     Family Communication : Daughter hazelene at bedside . Spoke with daughter [name] on the phone  Disposition Plan : Physical therapy and recommended skilled nursing facility as patient is very weak and has difficulty to transfer and ambulate. Patient's daughter [name] is POA and lives with her. She refuses to send patient to skilled nursing facility and reports that she has necessary equipment at home and can provide her 24 hour supervision. Will arrange for home health RN and PT   Consults :  ID Vascular surgery   Procedures :  MRI right foot 2-D echo Lower extremity angiogram  Discharge Instructions     Medication List    TAKE these medications        acetaminophen-codeine 300-30 MG tablet  Commonly known as:  TYLENOL #3  Take 1 tablet by mouth every 8 (eight) hours as needed (for pain).     albuterol 108 (90 Base) MCG/ACT inhaler  Commonly known as:  PROVENTIL HFA;VENTOLIN HFA  Inhale 2 puffs into the lungs every 6 (six) hours as needed for wheezing or shortness of breath.     BD ULTRA-FINE LANCETS lancets  Use to check blood sugar one time daily     brimonidine 0.1 % Soln  Commonly known as:  ALPHAGAN P  Place 1 drop into the left eye every 12 (twelve) hours.     ceFAZolin 1-5 GM-%  Commonly known as:  ANCEF  Inject 50 mLs (1 g total) into the vein every 8 (eight) hours.     dorzolamide 2 % ophthalmic solution  Commonly known as:  TRUSOPT  Place 1 drop into the left eye 2 (two) times daily.     DULERA 100-5 MCG/ACT Aero  Generic drug:  mometasone-formoterol  INHALE 2 PUFFS INTO THE LUNGS TWICE DAILY     feeding supplement (PRO-STAT SUGAR FREE 64) Liqd  Take 30 mLs by mouth daily at 3 pm.     ferrous sulfate 325 (65 FE) MG tablet  TAKE 1 TABLET(325 MG) BY MOUTH DAILY     furosemide 80 MG tablet  Commonly known as:  LASIX  Take 80mg  in  the AM and 40mg  in the PM     glucose blood test strip  Use as instructed     lisinopril 20 MG tablet  Commonly known as:  PRINIVIL,ZESTRIL  Take 1 tablet (20 mg total) by mouth daily.     metFORMIN 1000 MG tablet  Commonly known as:  GLUCOPHAGE  TAKE 1 TABLET(1000 MG) BY MOUTH TWICE DAILY WITH A MEAL     metoprolol succinate 100 MG 24 hr tablet  Commonly known as:  TOPROL-XL  Take 1 tablet daily     potassium chloride SA 20 MEQ tablet  Commonly known as:  K-DUR,KLOR-CON  Take 2 tablets (40 mEq total) by mouth daily. Only take this medication on days you take Lasix.     ranitidine 75 MG tablet  Commonly known as:  ZANTAC  Take 75 mg by mouth daily as needed for heartburn.     rivaroxaban 20 MG Tabs tablet  Commonly known as:  XARELTO  Take 20 mg by mouth daily with supper.     spironolactone 25 MG tablet  Commonly known as:  ALDACTONE  TAKE 1 TABLET BY MOUTH EVERY DAY           Follow-up Information  Follow up with NICKEL, SUZANNE L, NP In 4 weeks.   Specialty:  Vascular Surgery   Why:  Follow-up right leg ulcer. Our office will call you to arrange an appointment.    Contact information:   [address] [place] [address] [phone]       Follow up with Gust Rung, DO. Schedule an appointment as soon as possible for a visit in 1 week.   Specialty:  Internal Medicine   Contact information:   [address] [place] [address] [phone]      Allergies  Allergen Reactions  . Amiodarone Other (See Comments)    Contributed to hypoxia        Procedures/Studies: Dg Chest 2 View  01/26/2016  CLINICAL DATA:  Sepsis. EXAM: CHEST  2 VIEW COMPARISON:  Chest radiographs dated 11/24/2014 and chest CT dated 05/03/2015. FINDINGS: Stable enlarged cardiac silhouette and left basilar scarring. The trachea remains deviated to the right at the thoracic inlet. Diffuse osteopenia. IMPRESSION: 1. No acute abnormality. 2. Stable cardiomegaly and left  basilar scarring. 3. Stable tracheal deviation to the right at the level of the thoracic inlet due to a previously demonstrated large thyroid nodule. Electronically Signed   By: Beckie Salts M.D.   On: 01/26/2016 19:39   Mr Foot Right W Wo Contrast  01/27/2016  CLINICAL DATA:  Foot pain with draining wounds.  Heel pain. EXAM: MRI OF THE RIGHT FOREFOOT WITHOUT AND WITH CONTRAST TECHNIQUE: Multiplanar, multisequence MR imaging was performed both before and after administration of intravenous contrast. CONTRAST:  7mL MULTIHANCE GADOBENATE DIMEGLUMINE 529 MG/ML IV SOLN COMPARISON:  01/26/2016 FINDINGS: Osteomyelitis protocol MRI of the foot was obtained, to include the entire foot and ankle. This protocol uses a large field of view to cover the entire foot and ankle, and is suitable for assessing bony structures for osteomyelitis. Due to the large field of view and imaging plane choice, this protocol is less sensitive for assessing small structures such as ligamentous structures of the foot and ankle, compared to a dedicated forefoot or dedicated hindfoot exam. Despite efforts by the technologist and patient, motion artifact is present on today's exam and could not be eliminated. This reduces exam sensitivity and specificity. There is diffuse subcutaneous edema in the ankle, heel, and foot. In the head of the first metatarsal there is an erosive lesion dorsum medially. Reportedly this is in the vicinity of a skin wound, although the wound is poorly seen on today's MRI. Differential diagnostic considerations include early osteomyelitis versus gout arthropathy. No other osseous edema or significant abnormal osseous enhancement observed to suggest osteomyelitis elsewhere. Incidental note is made of a degenerative subcortical cyst or geode distally in the middle cuneiform. No malalignment at the Lisfranc joint. Thickened plantar fascia raises possibility plantar fasciitis. No drainable abscess. No significant joint  effusion to further suggest septic joint. IMPRESSION: 1. Erosive lesion along the dorsal -medial head of the first metatarsal with associated low-level edema and enhancement. This resembles a gout lesion or erosive arthropathy, but by report there is a draining wound in this vicinity which increases the likelihood that this could represent localized early osteomyelitis. 2. Diffuse soft tissue edema in the ankle and foot, nonspecific for bland edema versus cellulitis. 3. Thickened plantar fascia suggesting plantar fasciitis. 4. This final interpretation correlates with the preliminary report issued by Dr. Cherly Hensen. Electronically Signed   By: Gaylyn Rong M.D.   On: 01/27/2016 13:32   Dg Chest [facility]  02/02/2016  CLINICAL DATA:  Patient status post PICC line placement. EXAM: PORTABLE CHEST 1 VIEW COMPARISON:  Chest radiograph 01/26/2016. FINDINGS: Interval insertion right upper extremity PICC line with tip projecting over the superior vena cava. Stable cardiomegaly. Persistent small left pleural effusion and heterogeneous opacities left lung base. No pneumothorax. IMPRESSION: Right upper extremity PICC line tip projects over the superior vena cava. Persistent small left pleural effusion and underlying opacities. Electronically Signed   By: Annia Belt M.D.   On: 02/02/2016 11:49   Dg Foot Complete Right  01/26/2016  CLINICAL DATA:  Right foot wounds, diabetes EXAM: RIGHT FOOT COMPLETE - 3+ VIEW COMPARISON:  12/09/2013 FINDINGS: Three views of the right foot submitted. There is diffuse osteopenia. Mild soft tissue swelling adjacent to distal aspect of first metatarsal. No acute fracture or subluxation. Atherosclerotic vascular calcifications are noted. No bone destruction to suggest osteomyelitis. There is plantar spur of calcaneus. Dorsal spurring anterior aspect of the talus. IMPRESSION: No acute fracture or subluxation. Limited study by diffuse osteopenia. Mild soft tissue swelling adjacent to distal  aspect of first metatarsal. No evidence of bone destruction to suggest osteomyelitis. Plantar spur of calcaneus. Degenerative changes with dorsal spurring anterior aspect of the talus. Electronically Signed   By: Natasha Mead M.D.   On: 01/26/2016 17:00    (   Subjective:   Discharge Exam: Filed Vitals:   02/03/16 0603 02/03/16 1000  BP: 109/59 107/65  Pulse: 98 88  Temp: 98.1 F (36.7 C) 98.2 F (36.8 C)  Resp: 20 20   Filed Vitals:   02/02/16 2031 02/03/16 0603 02/03/16 0819 02/03/16 1000  BP:  109/59  107/65  Pulse: 85 98  88  Temp:  98.1 F (36.7 C)  98.2 F (36.8 C)  TempSrc:  Oral  Oral  Resp: Height:      Weight:      SpO2: 96% 97% 93% 96%    Gen: not in distress HEENT: moist mucosa, supple neck Chest: clear b/l, no added sounds CVS: S1 and S2 irregularly irregular, no murmurs, rubs or gallop GI: soft, NT, ND, BS+ Musculoskeletal: warm, no edema, dressing over rt leg,, PICC line right upper extremity CNS: AAOX2, non focal    The results of significant diagnostics from this hospitalization (including imaging, microbiology, ancillary and laboratory) are listed below for reference.     Microbiology: Recent Results (from the past 240 hour(s))  Blood Culture (routine x 2)     Status: Abnormal   Collection Time: 01/26/16  5:58 PM  Result Value Ref Range Status   Specimen Description BLOOD WRIST LEFT  Final   Special Requests BOTTLES DRAWN AEROBIC ONLY 6CC  Final   Culture  Setup Time   Final    GRAM POSITIVE COCCI IN CLUSTERS AEROBIC BOTTLE ONLY CRITICAL RESULT CALLED TO, READ BACK BY AND VERIFIED WITH: R RUMBARGER,PHARMD AT 0957 01/27/16    Culture STAPHYLOCOCCUS AUREUS (A)  Final   Report Status 01/29/2016 FINAL  Final   Organism ID, Bacteria STAPHYLOCOCCUS AUREUS  Final      Susceptibility   Staphylococcus aureus - MIC*    CIPROFLOXACIN <=0.5 SENSITIVE Sensitive     ERYTHROMYCIN <=0.25 SENSITIVE Sensitive     GENTAMICIN <=0.5 SENSITIVE  Sensitive     OXACILLIN 0.5 SENSITIVE Sensitive     TETRACYCLINE <=1 SENSITIVE Sensitive     VANCOMYCIN <=0.5 SENSITIVE Sensitive     TRIMETH/SULFA <=10 SENSITIVE Sensitive     CLINDAMYCIN <=0.25 SENSITIVE  Sensitive     RIFAMPIN <=0.5 SENSITIVE Sensitive     Inducible Clindamycin NEGATIVE Sensitive     * STAPHYLOCOCCUS AUREUS  Blood Culture ID Panel (Reflexed)     Status: Abnormal   Collection Time: 01/26/16  5:58 PM  Result Value Ref Range Status   Enterococcus species NOT DETECTED NOT DETECTED Final   Vancomycin resistance NOT DETECTED NOT DETECTED Final   Listeria monocytogenes NOT DETECTED NOT DETECTED Final   Staphylococcus species DETECTED (A) NOT DETECTED Final    Comment: CRITICAL RESULT CALLED TO, READ BACK BY AND VERIFIED WITH: R RUMBARGER,PHARMD AT 0957 01/27/16    Staphylococcus aureus DETECTED (A) NOT DETECTED Final    Comment: CRITICAL RESULT CALLED TO, READ BACK BY AND VERIFIED WITH: R RUMBARGER,PHARMD AT 0957 01/27/16    Methicillin resistance NOT DETECTED NOT DETECTED Final   Streptococcus species NOT DETECTED NOT DETECTED Final   Streptococcus agalactiae NOT DETECTED NOT DETECTED Final   Streptococcus pneumoniae NOT DETECTED NOT DETECTED Final   Streptococcus pyogenes NOT DETECTED NOT DETECTED Final   Acinetobacter baumannii NOT DETECTED NOT DETECTED Final   Enterobacteriaceae species NOT DETECTED NOT DETECTED Final   Enterobacter cloacae complex NOT DETECTED NOT DETECTED Final   Escherichia coli NOT DETECTED NOT DETECTED Final   Klebsiella oxytoca NOT DETECTED NOT DETECTED Final   Klebsiella pneumoniae NOT DETECTED NOT DETECTED Final   Proteus species NOT DETECTED NOT DETECTED Final   Serratia marcescens NOT DETECTED NOT DETECTED Final   Carbapenem resistance NOT DETECTED NOT DETECTED Final   Haemophilus influenzae NOT DETECTED NOT DETECTED Final   Neisseria meningitidis NOT DETECTED NOT DETECTED Final   Pseudomonas aeruginosa NOT DETECTED NOT DETECTED Final    Candida albicans NOT DETECTED NOT DETECTED Final   Candida glabrata NOT DETECTED NOT DETECTED Final   Candida krusei NOT DETECTED NOT DETECTED Final   Candida parapsilosis NOT DETECTED NOT DETECTED Final   Candida tropicalis NOT DETECTED NOT DETECTED Final  Blood Culture (routine x 2)     Status: Abnormal   Collection Time: 01/26/16  6:01 PM  Result Value Ref Range Status   Specimen Description BLOOD RIGHT HAND  Final   Special Requests BOTTLES DRAWN AEROBIC AND ANAEROBIC 5CC  Final   Culture  Setup Time   Final    GRAM POSITIVE COCCI IN CLUSTERS IN BOTH AEROBIC AND ANAEROBIC BOTTLES CRITICAL RESULT CALLED TO, READ BACK BY AND VERIFIED WITH: R RUMBARGER,PHARMD AT 0957 01/27/16    Culture (A)  Final    STAPHYLOCOCCUS AUREUS SUSCEPTIBILITIES PERFORMED ON PREVIOUS CULTURE WITHIN THE LAST 5 DAYS.    Report Status 01/29/2016 FINAL  Final  Urine culture     Status: Abnormal   Collection Time: 01/27/16  1:15 AM  Result Value Ref Range Status   Specimen Description URINE, RANDOM  Final   Special Requests NONE  Final   Culture MULTIPLE SPECIES PRESENT, SUGGEST RECOLLECTION (A)  Final   Report Status 01/28/2016 FINAL  Final  Blood Cultures x 2 sites     Status: None   Collection Time: 01/27/16  4:04 PM  Result Value Ref Range Status   Specimen Description BLOOD RIGHT ANTECUBITAL  Final   Special Requests BOTTLES DRAWN AEROBIC ONLY 5CC  Final   Culture NO GROWTH 5 DAYS  Final   Report Status 02/01/2016 FINAL  Final  Blood Cultures x 2 sites     Status: Abnormal   Collection Time: 01/27/16  4:15 PM  Result Value Ref Range Status   Specimen  Description BLOOD RIGHT WRIST  Final   Special Requests IN PEDIATRIC BOTTLE 2CC  Final   Culture  Setup Time   Final    GRAM POSITIVE COCCI IN CLUSTERS IN PEDIATRIC BOTTLE CRITICAL RESULT CALLED TO, READ BACK BY AND VERIFIED WITH: Celedonio Miyamoto  [id number] AT 0846 [facility]    Culture (A)  Final    STAPHYLOCOCCUS AUREUS SUSCEPTIBILITIES PERFORMED ON  PREVIOUS CULTURE WITHIN THE LAST 5 DAYS.    Report Status 01/30/2016 FINAL  Final  Culture, blood (routine x 2)     Status: None (Preliminary result)   Collection Time: 01/30/16  6:20 AM  Result Value Ref Range Status   Specimen Description BLOOD RIGHT ANTECUBITAL  Final   Special Requests IN PEDIATRIC BOTTLE  3CC  Final   Culture NO GROWTH 3 DAYS  Final   Report Status PENDING  Incomplete  Culture, blood (routine x 2)     Status: None (Preliminary result)   Collection Time: 01/30/16  6:25 AM  Result Value Ref Range Status   Specimen Description BLOOD RIGHT FOREARM  Final   Special Requests IN PEDIATRIC BOTTLE  3CC  Final   Culture NO GROWTH 3 DAYS  Final   Report Status PENDING  Incomplete     Labs: BNP (last 3 results)  Recent Labs  03/19/15 1515 12/18/15 1539 01/22/16 1616  BNP 423.8* 406.0* 805.5*   Basic Metabolic Panel:  Recent Labs Lab 01/28/16 0416 01/29/16 0331 02/01/16 0655 02/02/16 0500  NA 136  --  137 134*  K 3.5  --  5.2* 4.2  CL 96*  --  96* 97*  CO2 30  --  28 28  GLUCOSE 123*  --  140* 132*  BUN 13  --  14 21*  CREATININE 0.95  --  0.89 0.91  CALCIUM 7.5*  --  9.0 8.6*  MG  --  1.7  --   --    Liver Function Tests: No results for input(s): AST, ALT, ALKPHOS, BILITOT, PROT, ALBUMIN in the last 168 hours. No results for input(s): LIPASE, AMYLASE in the last 168 hours. No results for input(s): AMMONIA in the last 168 hours. CBC:  Recent Labs Lab 01/30/16 0608 01/31/16 0524 02/01/16 0655 02/02/16 0500 02/03/16 0545  WBC 7.6 5.9 5.5 6.2 5.8  HGB 12.1 11.6* 13.8 12.1 11.8*  HCT 38.8 38.2 43.0 39.5 37.9  MCV 94.6 94.1 92.7 95.0 92.2  PLT 337 328 300 355 364   Cardiac Enzymes: No results for input(s): CKTOTAL, CKMB, CKMBINDEX, TROPONINI in the last 168 hours. BNP: Invalid input(s): POCBNP CBG:  Recent Labs Lab 02/02/16 1706 02/02/16 1808 02/02/16 1930 02/03/16 0801 02/03/16 1149  GLUCAP 70 216* 262* 180* 178*   D-Dimer No  results for input(s): DDIMER in the last 72 hours. Hgb A1c No results for input(s): HGBA1C in the last 72 hours. Lipid Profile No results for input(s): CHOL, HDL, LDLCALC, TRIG, CHOLHDL, LDLDIRECT in the last 72 hours. Thyroid function studies No results for input(s): TSH, T4TOTAL, T3FREE, THYROIDAB in the last 72 hours.  Invalid input(s): FREET3 Anemia work up No results for input(s): VITAMINB12, FOLATE, FERRITIN, TIBC, IRON, RETICCTPCT in the last 72 hours. Urinalysis    Component Value Date/Time   COLORURINE YELLOW 01/27/2016 0115   APPEARANCEUR CLEAR 01/27/2016 0115   LABSPEC 1.010 01/27/2016 0115   PHURINE 5.5 01/27/2016 0115   GLUCOSEU NEGATIVE 01/27/2016 0115   HGBUR TRACE* 01/27/2016 0115   HGBUR trace-lysed 07/17/2008 0902   BILIRUBINUR NEGATIVE 01/27/2016 0115  KETONESUR NEGATIVE 01/27/2016 0115   PROTEINUR 30* 01/27/2016 0115   UROBILINOGEN 1.0 02/17/2014 1259   NITRITE NEGATIVE 01/27/2016 0115   LEUKOCYTESUR NEGATIVE 01/27/2016 0115   Sepsis Labs Invalid input(s): PROCALCITONIN,  WBC,  LACTICIDVEN Microbiology Recent Results (from the past 240 hour(s))  Blood Culture (routine x 2)     Status: Abnormal   Collection Time: 01/26/16  5:58 PM  Result Value Ref Range Status   Specimen Description BLOOD WRIST LEFT  Final   Special Requests BOTTLES DRAWN AEROBIC ONLY 6CC  Final   Culture  Setup Time   Final    GRAM POSITIVE COCCI IN CLUSTERS AEROBIC BOTTLE ONLY CRITICAL RESULT CALLED TO, READ BACK BY AND VERIFIED WITH: R RUMBARGER,PHARMD AT 0957 01/27/16    Culture STAPHYLOCOCCUS AUREUS (A)  Final   Report Status 01/29/2016 FINAL  Final   Organism ID, Bacteria STAPHYLOCOCCUS AUREUS  Final      Susceptibility   Staphylococcus aureus - MIC*    CIPROFLOXACIN <=0.5 SENSITIVE Sensitive     ERYTHROMYCIN <=0.25 SENSITIVE Sensitive     GENTAMICIN <=0.5 SENSITIVE Sensitive     OXACILLIN 0.5 SENSITIVE Sensitive     TETRACYCLINE <=1 SENSITIVE Sensitive     VANCOMYCIN  <=0.5 SENSITIVE Sensitive     TRIMETH/SULFA <=10 SENSITIVE Sensitive     CLINDAMYCIN <=0.25 SENSITIVE Sensitive     RIFAMPIN <=0.5 SENSITIVE Sensitive     Inducible Clindamycin NEGATIVE Sensitive     * STAPHYLOCOCCUS AUREUS  Blood Culture ID Panel (Reflexed)     Status: Abnormal   Collection Time: 01/26/16  5:58 PM  Result Value Ref Range Status   Enterococcus species NOT DETECTED NOT DETECTED Final   Vancomycin resistance NOT DETECTED NOT DETECTED Final   Listeria monocytogenes NOT DETECTED NOT DETECTED Final   Staphylococcus species DETECTED (A) NOT DETECTED Final    Comment: CRITICAL RESULT CALLED TO, READ BACK BY AND VERIFIED WITH: R RUMBARGER,PHARMD AT 0957 01/27/16    Staphylococcus aureus DETECTED (A) NOT DETECTED Final    Comment: CRITICAL RESULT CALLED TO, READ BACK BY AND VERIFIED WITH: R RUMBARGER,PHARMD AT 0957 01/27/16    Methicillin resistance NOT DETECTED NOT DETECTED Final   Streptococcus species NOT DETECTED NOT DETECTED Final   Streptococcus agalactiae NOT DETECTED NOT DETECTED Final   Streptococcus pneumoniae NOT DETECTED NOT DETECTED Final   Streptococcus pyogenes NOT DETECTED NOT DETECTED Final   Acinetobacter baumannii NOT DETECTED NOT DETECTED Final   Enterobacteriaceae species NOT DETECTED NOT DETECTED Final   Enterobacter cloacae complex NOT DETECTED NOT DETECTED Final   Escherichia coli NOT DETECTED NOT DETECTED Final   Klebsiella oxytoca NOT DETECTED NOT DETECTED Final   Klebsiella pneumoniae NOT DETECTED NOT DETECTED Final   Proteus species NOT DETECTED NOT DETECTED Final   Serratia marcescens NOT DETECTED NOT DETECTED Final   Carbapenem resistance NOT DETECTED NOT DETECTED Final   Haemophilus influenzae NOT DETECTED NOT DETECTED Final   Neisseria meningitidis NOT DETECTED NOT DETECTED Final   Pseudomonas aeruginosa NOT DETECTED NOT DETECTED Final   Candida albicans NOT DETECTED NOT DETECTED Final   Candida glabrata NOT DETECTED NOT DETECTED Final    Candida krusei NOT DETECTED NOT DETECTED Final   Candida parapsilosis NOT DETECTED NOT DETECTED Final   Candida tropicalis NOT DETECTED NOT DETECTED Final  Blood Culture (routine x 2)     Status: Abnormal   Collection Time: 01/26/16  6:01 PM  Result Value Ref Range Status   Specimen Description BLOOD RIGHT HAND  Final  Special Requests BOTTLES DRAWN AEROBIC AND ANAEROBIC 5CC  Final   Culture  Setup Time   Final    GRAM POSITIVE COCCI IN CLUSTERS IN BOTH AEROBIC AND ANAEROBIC BOTTLES CRITICAL RESULT CALLED TO, READ BACK BY AND VERIFIED WITH: R RUMBARGER,PHARMD AT 0957 01/27/16    Culture (A)  Final    STAPHYLOCOCCUS AUREUS SUSCEPTIBILITIES PERFORMED ON PREVIOUS CULTURE WITHIN THE LAST 5 DAYS.    Report Status 01/29/2016 FINAL  Final  Urine culture     Status: Abnormal   Collection Time: 01/27/16  1:15 AM  Result Value Ref Range Status   Specimen Description URINE, RANDOM  Final   Special Requests NONE  Final   Culture MULTIPLE SPECIES PRESENT, SUGGEST RECOLLECTION (A)  Final   Report Status 01/28/2016 FINAL  Final  Blood Cultures x 2 sites     Status: None   Collection Time: 01/27/16  4:04 PM  Result Value Ref Range Status   Specimen Description BLOOD RIGHT ANTECUBITAL  Final   Special Requests BOTTLES DRAWN AEROBIC ONLY 5CC  Final   Culture NO GROWTH 5 DAYS  Final   Report Status 02/01/2016 FINAL  Final  Blood Cultures x 2 sites     Status: Abnormal   Collection Time: 01/27/16  4:15 PM  Result Value Ref Range Status   Specimen Description BLOOD RIGHT WRIST  Final   Special Requests IN PEDIATRIC BOTTLE 2CC  Final   Culture  Setup Time   Final    GRAM POSITIVE COCCI IN CLUSTERS IN PEDIATRIC BOTTLE CRITICAL RESULT CALLED TO, READ BACK BY AND VERIFIED WITH: Celedonio Miyamoto  [id number] AT [address]    Culture (A)  Final    STAPHYLOCOCCUS AUREUS SUSCEPTIBILITIES PERFORMED ON PREVIOUS CULTURE WITHIN THE LAST 5 DAYS.    Report Status 01/30/2016 FINAL  Final  Culture, blood  (routine x 2)     Status: None (Preliminary result)   Collection Time: 01/30/16  6:20 AM  Result Value Ref Range Status   Specimen Description BLOOD RIGHT ANTECUBITAL  Final   Special Requests IN PEDIATRIC BOTTLE  3CC  Final   Culture NO GROWTH 3 DAYS  Final   Report Status PENDING  Incomplete  Culture, blood (routine x 2)     Status: None (Preliminary result)   Collection Time: 01/30/16  6:25 AM  Result Value Ref Range Status   Specimen Description BLOOD RIGHT FOREARM  Final   Special Requests IN PEDIATRIC BOTTLE  3CC  Final   Culture NO GROWTH 3 DAYS  Final   Report Status PENDING  Incomplete     Time coordinating discharge: Over 30 minutes  SIGNED:   Eddie North, MD  [facility] 02/03/2016, 3:13 PM Pager   If 7PM-7AM, please contact night-coverage [email or web address] Password [email or web address]

## 2016-02-03 NOTE — Care Management Note (Signed)
Case Management Note  Patient Details  Name: [name] MRN: [id number] Date of Birth: April 27, 1926  Subjective/Objective:                  R great toe infection Action/Plan: Discharge planning Expected Discharge Date:  02/03/16               Expected Discharge Plan:  [place]  In-House Referral:     Discharge planning Services  CM Consult  Post Acute Care Choice:  Home Health Choice offered to:  Patient, Adult Children  DME Arranged:  IV pump/equipment DME Agency:  [place] Arranged:  RN, PT, OT [facility] Agency:  [place]  Status of Service:  Completed, signed off  Medicare Important Message Given:  Yes Date Medicare IM Given:    Medicare IM give by:    Date Additional Medicare IM Given:    Additional Medicare Important Message give by:     If discussed at [place] of Stay Meetings, dates discussed:    Additional Comments: CM met with pt and pt's family to offer choice of home health agency.  Pt was active with [facility] and wishes to have [place] render HHRN/PT/OT and IVABX administration and line care.  Pt lives with daughter, [name] and family states there will be teachable source between the family members.  Referral called to [facility] rep, Tiffany and prescription faxed to [facility] pharmacy with [facility] 02/04/16; pt will receive last run this evening.  CM has called [facility] DME rep, Jeneen Rinks to please deliver a loaner tank of O2 as pt's portable is not available.  Pt has a rolling walker and 3n1 at home.  Family is transporting pt back home after last run of IV ABX this evening.  No other CM needs were communicated. Dellie Catholic, RN 02/03/2016, 4:07 PM

## 2016-02-04 ENCOUNTER — Other Ambulatory Visit: Payer: Self-pay | Admitting: Internal Medicine

## 2016-02-04 DIAGNOSIS — E0500 Thyrotoxicosis with diffuse goiter without thyrotoxic crisis or storm: Secondary | ICD-10-CM | POA: Diagnosis not present

## 2016-02-04 DIAGNOSIS — M86171 Other acute osteomyelitis, right ankle and foot: Secondary | ICD-10-CM | POA: Diagnosis not present

## 2016-02-04 DIAGNOSIS — I509 Heart failure, unspecified: Secondary | ICD-10-CM | POA: Diagnosis not present

## 2016-02-04 LAB — CULTURE, BLOOD (ROUTINE X 2)
Culture: NO GROWTH
Culture: NO GROWTH

## 2016-02-05 DIAGNOSIS — Z792 Long term (current) use of antibiotics: Secondary | ICD-10-CM | POA: Diagnosis not present

## 2016-02-05 DIAGNOSIS — E1151 Type 2 diabetes mellitus with diabetic peripheral angiopathy without gangrene: Secondary | ICD-10-CM | POA: Diagnosis not present

## 2016-02-05 DIAGNOSIS — Z452 Encounter for adjustment and management of vascular access device: Secondary | ICD-10-CM | POA: Diagnosis not present

## 2016-02-05 DIAGNOSIS — M19012 Primary osteoarthritis, left shoulder: Secondary | ICD-10-CM | POA: Diagnosis not present

## 2016-02-05 DIAGNOSIS — M301 Polyarteritis with lung involvement [Churg-Strauss]: Secondary | ICD-10-CM | POA: Diagnosis not present

## 2016-02-05 DIAGNOSIS — M810 Age-related osteoporosis without current pathological fracture: Secondary | ICD-10-CM | POA: Diagnosis not present

## 2016-02-05 DIAGNOSIS — J45909 Unspecified asthma, uncomplicated: Secondary | ICD-10-CM | POA: Diagnosis not present

## 2016-02-05 DIAGNOSIS — Z7984 Long term (current) use of oral hypoglycemic drugs: Secondary | ICD-10-CM | POA: Diagnosis not present

## 2016-02-05 DIAGNOSIS — M86171 Other acute osteomyelitis, right ankle and foot: Secondary | ICD-10-CM | POA: Diagnosis not present

## 2016-02-05 DIAGNOSIS — E0500 Thyrotoxicosis with diffuse goiter without thyrotoxic crisis or storm: Secondary | ICD-10-CM | POA: Diagnosis not present

## 2016-02-05 DIAGNOSIS — I110 Hypertensive heart disease with heart failure: Secondary | ICD-10-CM | POA: Diagnosis not present

## 2016-02-05 DIAGNOSIS — L89610 Pressure ulcer of right heel, unstageable: Secondary | ICD-10-CM | POA: Diagnosis not present

## 2016-02-05 DIAGNOSIS — I272 Other secondary pulmonary hypertension: Secondary | ICD-10-CM | POA: Diagnosis not present

## 2016-02-05 DIAGNOSIS — Z9981 Dependence on supplemental oxygen: Secondary | ICD-10-CM | POA: Diagnosis not present

## 2016-02-05 DIAGNOSIS — Z5181 Encounter for therapeutic drug level monitoring: Secondary | ICD-10-CM | POA: Diagnosis not present

## 2016-02-05 DIAGNOSIS — I509 Heart failure, unspecified: Secondary | ICD-10-CM | POA: Diagnosis not present

## 2016-02-05 DIAGNOSIS — S91101D Unspecified open wound of right great toe without damage to nail, subsequent encounter: Secondary | ICD-10-CM | POA: Diagnosis not present

## 2016-02-05 DIAGNOSIS — I4891 Unspecified atrial fibrillation: Secondary | ICD-10-CM | POA: Diagnosis not present

## 2016-02-05 DIAGNOSIS — Z7902 Long term (current) use of antithrombotics/antiplatelets: Secondary | ICD-10-CM | POA: Diagnosis not present

## 2016-02-05 MED ORDER — LISINOPRIL 20 MG PO TABS
20.0000 mg | ORAL_TABLET | Freq: Every day | ORAL | Status: DC
Start: 2016-02-05 — End: 2016-03-24

## 2016-02-05 NOTE — Telephone Encounter (Signed)
Last written for 20mg .

## 2016-02-06 ENCOUNTER — Telehealth: Payer: Self-pay | Admitting: Internal Medicine

## 2016-02-06 ENCOUNTER — Telehealth: Payer: Self-pay

## 2016-02-06 DIAGNOSIS — I70235 Atherosclerosis of native arteries of right leg with ulceration of other part of foot: Secondary | ICD-10-CM

## 2016-02-06 DIAGNOSIS — M86171 Other acute osteomyelitis, right ankle and foot: Secondary | ICD-10-CM | POA: Diagnosis not present

## 2016-02-06 DIAGNOSIS — E0500 Thyrotoxicosis with diffuse goiter without thyrotoxic crisis or storm: Secondary | ICD-10-CM | POA: Diagnosis not present

## 2016-02-06 NOTE — Telephone Encounter (Signed)
-----   Message from Nada Libman, MD sent at 02/05/2016  9:47 PM EDT ----- Regarding: RE: recommendations This was originally a [facility] patient that I guess I am taking over.  She soul be referred to the wound center for evaluation ----- Message -----    From: Phillips Odor, RN    Sent: 02/05/2016   4:35 PM      To: Nada Libman, MD Subject: recommendations                                rec'd call from [facility] RN asking about wound care to the right foot pressure ulcer, and sore on lateral (R) great toe.  You performed an angiogram and angioplasty of (R) Post. Tibial Artery 6/14.  There was no mention of wound care in her discharge note by the Hospitalist.  Would I direct the [facility] RN to contact PCP for further wound care orders?

## 2016-02-06 NOTE — Telephone Encounter (Signed)
Attempted to contact the [facility] RN to report recommendation by Dr. Myra Gianotti for referral to the Wound Center.  Left voice message that a wound center referral will be made.  Advised to call back to office if questions.

## 2016-02-06 NOTE — Telephone Encounter (Signed)
Katie from [facility] requesting VO. Please call back.

## 2016-02-06 NOTE — Telephone Encounter (Signed)
   Reason for call:   I received a call from the daughter of [name] at 623  PM indicating her mother's foot pain is not well controlled..   Pertinent Data:   Her mother is complaining of persistent pain in her right foot. She has wund on her heel and side of big toe of the right foot.  She was recently hospitalized 6/10-6/18 and was discharged on IV antibiotic therapy.   No drainage, fever, chills.   Her mother has been taking Tynenol 3 every 8 hours as prescribed though is not alleviating her pain.   Assessment / Plan / Recommendations:   I explained that I cannot write for pain medication over the phone. I advised her to try alternating the Tylenol 3 with just Tylenol as her mother did find relief with the Extra Strength formulation. I did not recommend NSAID given Xarelto.  I recommended her mother be seen in the clinic for further pain control. She agrees as she is due for follow-up.  As always, pt is advised that if symptoms worsen or new symptoms arise, they should go to an urgent care facility or to to ER for further evaluation.   Beather Arbour, MD   02/06/2016, 6:26 PM

## 2016-02-07 NOTE — Telephone Encounter (Signed)
Pt has appt 6/27

## 2016-02-08 ENCOUNTER — Telehealth: Payer: Self-pay | Admitting: Internal Medicine

## 2016-02-08 DIAGNOSIS — Z5181 Encounter for therapeutic drug level monitoring: Secondary | ICD-10-CM | POA: Diagnosis not present

## 2016-02-08 DIAGNOSIS — E0500 Thyrotoxicosis with diffuse goiter without thyrotoxic crisis or storm: Secondary | ICD-10-CM | POA: Diagnosis not present

## 2016-02-08 DIAGNOSIS — M86171 Other acute osteomyelitis, right ankle and foot: Secondary | ICD-10-CM | POA: Diagnosis not present

## 2016-02-08 NOTE — Telephone Encounter (Addendum)
   Reason for call:   I received a call from Mr. Merri Brunette with [facility] on behalf of [name] at 232  PM indicating abnormal test result.   Pertinent Data:   Earlier today, her labwork resulted with K>8. He is not sure if she is having symptoms and is en route to her house.   He is also unsure if the sample was hemolyzed.    Assessment / Plan / Recommendations:   Per review of her medications, contributory causes include spironolactone, lisinopril, potassium supplementation.  He will call me back once he reaches her home.   I tried calling the patient's home phone and was unable to reach her. Her daughter Reives did not pick up the phone. I left a voicemail on her son Charlie's phone.  As always, pt is advised that if symptoms worsen or new symptoms arise, they should go to an urgent care facility or to to ER for further evaluation.   Beather Arbour, MD   02/08/2016, 2:38 PM   ADDENDUM 02/08/2016  3:26 PM:  Upon arrival, her vitals are unchanged. She has no chest pain, dyspnea, palpitations or any other complaints. I advised rechecking a STAT BMET and to advise her to hold the three medications noted above.  Mr. Andrey Campanile also made a note that her home phone is [phone] which I will update in our system.   ADDENDUM 02/08/2016  6:52 PM:  I called around 1 hour ago, and Mr. Andrey Campanile noted to me that the lab is still pending. He did report that someone from [facility] will be following up with [place] as soon as the result comes back.

## 2016-02-08 NOTE — Telephone Encounter (Signed)
Attempted to call Mr. [name] to follow up since I had not received any calls from [facility]. Reached voicemail. No answer from patient's home phone number.

## 2016-02-09 NOTE — Telephone Encounter (Addendum)
I spoke with Mr. Merri Brunette again who gave me the main line for [facility]. I spoke with triage RN Devonne Doughty who told me that the bloodwork was not received until the evening.   BMET was reassuring for Na 135, K 4.9, bicarb 28, Cl 95, BUN 27, Crt 1.04, glucose 136.  I suspect the initial sample was hemolyzed and will ask the patient resume lisinopril, spironolactone, and Lasix without the potassium supplement. I attempted to call the patient though was unable to reach her though left a voicemail asking her to return a call.

## 2016-02-11 DIAGNOSIS — I110 Hypertensive heart disease with heart failure: Secondary | ICD-10-CM | POA: Diagnosis not present

## 2016-02-11 DIAGNOSIS — I272 Other secondary pulmonary hypertension: Secondary | ICD-10-CM | POA: Diagnosis not present

## 2016-02-11 DIAGNOSIS — E0500 Thyrotoxicosis with diffuse goiter without thyrotoxic crisis or storm: Secondary | ICD-10-CM | POA: Diagnosis not present

## 2016-02-11 DIAGNOSIS — M301 Polyarteritis with lung involvement [Churg-Strauss]: Secondary | ICD-10-CM | POA: Diagnosis not present

## 2016-02-11 DIAGNOSIS — Z792 Long term (current) use of antibiotics: Secondary | ICD-10-CM | POA: Diagnosis not present

## 2016-02-11 DIAGNOSIS — M810 Age-related osteoporosis without current pathological fracture: Secondary | ICD-10-CM | POA: Diagnosis not present

## 2016-02-11 DIAGNOSIS — M19012 Primary osteoarthritis, left shoulder: Secondary | ICD-10-CM | POA: Diagnosis not present

## 2016-02-11 DIAGNOSIS — S91101D Unspecified open wound of right great toe without damage to nail, subsequent encounter: Secondary | ICD-10-CM | POA: Diagnosis not present

## 2016-02-11 DIAGNOSIS — J45909 Unspecified asthma, uncomplicated: Secondary | ICD-10-CM | POA: Diagnosis not present

## 2016-02-11 DIAGNOSIS — E1151 Type 2 diabetes mellitus with diabetic peripheral angiopathy without gangrene: Secondary | ICD-10-CM | POA: Diagnosis not present

## 2016-02-11 DIAGNOSIS — Z452 Encounter for adjustment and management of vascular access device: Secondary | ICD-10-CM | POA: Diagnosis not present

## 2016-02-11 DIAGNOSIS — I4891 Unspecified atrial fibrillation: Secondary | ICD-10-CM | POA: Diagnosis not present

## 2016-02-11 DIAGNOSIS — Z7984 Long term (current) use of oral hypoglycemic drugs: Secondary | ICD-10-CM | POA: Diagnosis not present

## 2016-02-11 DIAGNOSIS — Z5181 Encounter for therapeutic drug level monitoring: Secondary | ICD-10-CM | POA: Diagnosis not present

## 2016-02-11 DIAGNOSIS — M86171 Other acute osteomyelitis, right ankle and foot: Secondary | ICD-10-CM | POA: Diagnosis not present

## 2016-02-11 DIAGNOSIS — Z7902 Long term (current) use of antithrombotics/antiplatelets: Secondary | ICD-10-CM | POA: Diagnosis not present

## 2016-02-11 DIAGNOSIS — I509 Heart failure, unspecified: Secondary | ICD-10-CM | POA: Diagnosis not present

## 2016-02-11 DIAGNOSIS — L89610 Pressure ulcer of right heel, unstageable: Secondary | ICD-10-CM | POA: Diagnosis not present

## 2016-02-11 DIAGNOSIS — Z9981 Dependence on supplemental oxygen: Secondary | ICD-10-CM | POA: Diagnosis not present

## 2016-02-12 ENCOUNTER — Telehealth: Payer: Self-pay

## 2016-02-12 ENCOUNTER — Ambulatory Visit: Payer: Medicare Other | Admitting: Internal Medicine

## 2016-02-12 ENCOUNTER — Ambulatory Visit (INDEPENDENT_AMBULATORY_CARE_PROVIDER_SITE_OTHER): Payer: Medicare Other | Admitting: Internal Medicine

## 2016-02-12 VITALS — Wt 134.3 lb

## 2016-02-12 DIAGNOSIS — A4101 Sepsis due to Methicillin susceptible Staphylococcus aureus: Secondary | ICD-10-CM | POA: Diagnosis not present

## 2016-02-12 DIAGNOSIS — M86171 Other acute osteomyelitis, right ankle and foot: Secondary | ICD-10-CM

## 2016-02-12 DIAGNOSIS — B9561 Methicillin susceptible Staphylococcus aureus infection as the cause of diseases classified elsewhere: Secondary | ICD-10-CM | POA: Diagnosis not present

## 2016-02-12 DIAGNOSIS — I110 Hypertensive heart disease with heart failure: Secondary | ICD-10-CM | POA: Diagnosis not present

## 2016-02-12 DIAGNOSIS — I739 Peripheral vascular disease, unspecified: Secondary | ICD-10-CM

## 2016-02-12 DIAGNOSIS — A4901 Methicillin susceptible Staphylococcus aureus infection, unspecified site: Secondary | ICD-10-CM | POA: Diagnosis not present

## 2016-02-12 DIAGNOSIS — E1151 Type 2 diabetes mellitus with diabetic peripheral angiopathy without gangrene: Secondary | ICD-10-CM

## 2016-02-12 DIAGNOSIS — S91101D Unspecified open wound of right great toe without damage to nail, subsequent encounter: Secondary | ICD-10-CM | POA: Diagnosis not present

## 2016-02-12 DIAGNOSIS — E0500 Thyrotoxicosis with diffuse goiter without thyrotoxic crisis or storm: Secondary | ICD-10-CM | POA: Diagnosis not present

## 2016-02-12 DIAGNOSIS — M79605 Pain in left leg: Secondary | ICD-10-CM

## 2016-02-12 DIAGNOSIS — I5042 Chronic combined systolic (congestive) and diastolic (congestive) heart failure: Secondary | ICD-10-CM

## 2016-02-12 MED ORDER — ATORVASTATIN CALCIUM 40 MG PO TABS: 40.0000 mg | ORAL_TABLET | Freq: Every day | ORAL | Status: DC

## 2016-02-12 MED ORDER — ASPIRIN EC 81 MG PO TBEC: 81.0000 mg | DELAYED_RELEASE_TABLET | Freq: Every day | ORAL | Status: DC

## 2016-02-12 MED ORDER — ACETAMINOPHEN-CODEINE #3 300-30 MG PO TABS: 1.0000 | ORAL_TABLET | Freq: Three times a day (TID) | ORAL | Status: DC | PRN

## 2016-02-12 NOTE — Progress Notes (Signed)
   Subjective:    Patient ID: [name], female    DOB: 03-01-1926, 82 y.o.   MRN: [id number]  HPI  82 yo F with CHF NICM EF 20-25%, , HTN, PVD, chronic AFib on xarelto, GERD, was hospitalized for sepsis 2/2 to MSSA bacteremia + possible osteomyelitis of right heel, here for follow up of the sepsis.  Had recanalization of occluded post tibiliar artery and balloon angio, has residular stenosis onto foot at the level of ankle, unable to be crossed. Has f/up with vascular surgery.  MRI foot erosive lesion medial head first metatarsal with low-level edema and enhancement; diffuse soft tissue edema of the ankle and foot. Suspect osteomyelitis.  For MSSA bacteremia, TEE was not done because of her frail status. Plan was to treat her with IV abx through PICC line for 6 weeks through 03/09/2016, on cefazolin (1 g every 8 hours). Getting HH PT and RN    Review of Systems  Constitutional: Negative for fever and chills.  HENT: Negative for congestion and sore throat.   Respiratory: Negative for chest tightness and shortness of breath.   Cardiovascular: Negative for chest pain and leg swelling.  Gastrointestinal: Negative for abdominal pain and abdominal distention.  Skin: Positive for wound.  Neurological: Negative for dizziness and numbness.       Objective:   Physical Exam  Constitutional: She is oriented to person, place, and time. No distress.  HENT:  Head: Normocephalic and atraumatic.  Eyes: Conjunctivae are normal. Right eye exhibits no discharge. Left eye exhibits no discharge.  Cardiovascular: Exam reveals no friction rub.   No murmur heard. Irregular rhythm. Could not palpate distal pulses   Pulmonary/Chest: Effort normal and breath sounds normal. No respiratory distress. She has no wheezes.  Abdominal: Soft. Bowel sounds are normal. She exhibits no distension. There is no tenderness.  Musculoskeletal: Normal range of motion.  Right foot has wound on 1st metatarsal area on the  dorsal surface and also wound with eschar formation on the heel. See picture for details. No discharge. Has tenderness throughout her foot and leg.   Neurological: She is alert and oriented to person, place, and time. No cranial nerve deficit.  Skin: She is not diaphoretic.      There were no vitals filed for this visit.  BP 92/57, P 104, o2 89-91% RA, T 97.6. Wt 134.3      Assessment & Plan:  See problem based a&p.

## 2016-02-12 NOTE — Assessment & Plan Note (Signed)
Having leg pain likely from her PVD and also worsened by the wound on her foot/heel.  Gave her #21 tabs of tylenol #3. Asked her to only take it if regular tylenol is not working for the pain.

## 2016-02-12 NOTE — Assessment & Plan Note (Signed)
Doing well. Appears euvolemic currently. BP is on the low side but she is not dizzy. Breathing well. Cont metoprolol, lisinopril, lasix and spirolactone.

## 2016-02-12 NOTE — Assessment & Plan Note (Signed)
Currently afebrile. Taking cefazolin 1g q8hr via PICC line, end date 03/09/2016.  Will check CBC today.

## 2016-02-12 NOTE — Patient Instructions (Signed)
Start taking lipitor for cholesterol. Take aspirin 81 mg daily.   Come back and see [place] in 1 week.  Gave you pain medication, only take if tylenol by itself is not taking care of the pain.

## 2016-02-12 NOTE — Telephone Encounter (Signed)
Pt daughter requesting the nurse to call back regarding med. Can be reach at work and cell [phone].

## 2016-02-12 NOTE — Assessment & Plan Note (Signed)
Having poor wound healing likely 2/2 to PAD and also DM II. Had recanalization of occluded post tibiliar artery and balloon angio, has residular stenosis onto foot at the level of ankle, unable to be crossed. Has f/up with vascular surgery.  Not sure why patient is not on asa 81 or any statins. Family thinks she was on them in the past but no longer on it for unknown reason.   will start asa 81 + lipitor 40mg . Check lipid panel today

## 2016-02-12 NOTE — Telephone Encounter (Signed)
Pt's daughter calls and ask about todays appt 1) should she restart lasix, lisinopril, K+ and spirolactone? She has never taken lasix when her wt was under 150lbs 2) does she really need the statin at this point, pt has told daughter she gets tired of taking pills and getting worse 3) daughter really feels as though the doctors do not read or talk about what one tells her and then she sees another and it contradicts the other one, shes not angry just frustrated Sending to dr's hoffman, ahmed, r.patel

## 2016-02-12 NOTE — Assessment & Plan Note (Signed)
Had presumed osteomyelitis of 1st metatarsal head diagnosis in the hospital with MSSA sepsis. Getting cefazolin for 6 weeks.  Doing well. Getting wound care at home. Has appt with wound care in 2 weeks. She has a right heel pressure ulcer now but does not look infected. - cont wound care at home - placed hydrofoam dressing on the heel. - f/up in 1 week to re-evaluate the wound - keep apt with wound care center in 2 weeks

## 2016-02-13 ENCOUNTER — Telehealth: Payer: Self-pay

## 2016-02-13 DIAGNOSIS — Z7984 Long term (current) use of oral hypoglycemic drugs: Secondary | ICD-10-CM | POA: Diagnosis not present

## 2016-02-13 DIAGNOSIS — Z5181 Encounter for therapeutic drug level monitoring: Secondary | ICD-10-CM | POA: Diagnosis not present

## 2016-02-13 DIAGNOSIS — M301 Polyarteritis with lung involvement [Churg-Strauss]: Secondary | ICD-10-CM | POA: Diagnosis not present

## 2016-02-13 DIAGNOSIS — I272 Other secondary pulmonary hypertension: Secondary | ICD-10-CM | POA: Diagnosis not present

## 2016-02-13 DIAGNOSIS — I110 Hypertensive heart disease with heart failure: Secondary | ICD-10-CM | POA: Diagnosis not present

## 2016-02-13 DIAGNOSIS — I4891 Unspecified atrial fibrillation: Secondary | ICD-10-CM | POA: Diagnosis not present

## 2016-02-13 DIAGNOSIS — J45909 Unspecified asthma, uncomplicated: Secondary | ICD-10-CM | POA: Diagnosis not present

## 2016-02-13 DIAGNOSIS — I509 Heart failure, unspecified: Secondary | ICD-10-CM | POA: Diagnosis not present

## 2016-02-13 DIAGNOSIS — Z792 Long term (current) use of antibiotics: Secondary | ICD-10-CM | POA: Diagnosis not present

## 2016-02-13 DIAGNOSIS — Z9981 Dependence on supplemental oxygen: Secondary | ICD-10-CM | POA: Diagnosis not present

## 2016-02-13 DIAGNOSIS — E1151 Type 2 diabetes mellitus with diabetic peripheral angiopathy without gangrene: Secondary | ICD-10-CM | POA: Diagnosis not present

## 2016-02-13 DIAGNOSIS — Z452 Encounter for adjustment and management of vascular access device: Secondary | ICD-10-CM | POA: Diagnosis not present

## 2016-02-13 DIAGNOSIS — M19012 Primary osteoarthritis, left shoulder: Secondary | ICD-10-CM | POA: Diagnosis not present

## 2016-02-13 DIAGNOSIS — L89610 Pressure ulcer of right heel, unstageable: Secondary | ICD-10-CM | POA: Diagnosis not present

## 2016-02-13 DIAGNOSIS — E0500 Thyrotoxicosis with diffuse goiter without thyrotoxic crisis or storm: Secondary | ICD-10-CM | POA: Diagnosis not present

## 2016-02-13 DIAGNOSIS — Z7902 Long term (current) use of antithrombotics/antiplatelets: Secondary | ICD-10-CM | POA: Diagnosis not present

## 2016-02-13 DIAGNOSIS — S91101D Unspecified open wound of right great toe without damage to nail, subsequent encounter: Secondary | ICD-10-CM | POA: Diagnosis not present

## 2016-02-13 DIAGNOSIS — M86171 Other acute osteomyelitis, right ankle and foot: Secondary | ICD-10-CM | POA: Diagnosis not present

## 2016-02-13 DIAGNOSIS — M810 Age-related osteoporosis without current pathological fracture: Secondary | ICD-10-CM | POA: Diagnosis not present

## 2016-02-13 LAB — CBC WITH DIFFERENTIAL/PLATELET
BASOS: 2 %
Basophils Absolute: 0.1 10*3/uL (ref 0.0–0.2)
EOS (ABSOLUTE): 0.1 10*3/uL (ref 0.0–0.4)
EOS: 3 %
HEMATOCRIT: 40.2 % (ref 34.0–46.6)
Hemoglobin: 13 g/dL (ref 11.1–15.9)
Immature Grans (Abs): 0 10*3/uL (ref 0.0–0.1)
Immature Granulocytes: 0 %
LYMPHS ABS: 1.1 10*3/uL (ref 0.7–3.1)
Lymphs: 29 %
MCH: 29.9 pg (ref 26.6–33.0)
MCHC: 32.3 g/dL (ref 31.5–35.7)
MCV: 92 fL (ref 79–97)
MONOS ABS: 0.6 10*3/uL (ref 0.1–0.9)
Monocytes: 16 %
NEUTROS ABS: 1.9 10*3/uL (ref 1.4–7.0)
Neutrophils: 50 %
Platelets: 359 10*3/uL (ref 150–379)
RBC: 4.35 x10E6/uL (ref 3.77–5.28)
RDW: 14.4 % (ref 12.3–15.4)
WBC: 3.9 10*3/uL (ref 3.4–10.8)

## 2016-02-13 LAB — LIPID PANEL
CHOL/HDL RATIO: 3.7 ratio (ref 0.0–4.4)
Cholesterol, Total: 172 mg/dL (ref 100–199)
HDL: 46 mg/dL (ref >39)
LDL Calculated: 101 mg/dL — ABNORMAL HIGH (ref 0–99)
Triglycerides: 123 mg/dL (ref 0–149)
VLDL Cholesterol Cal: 25 mg/dL (ref 5–40)

## 2016-02-13 NOTE — Progress Notes (Signed)
Internal Medicine Clinic Attending  Case discussed with Dr. Ahmed at the time of the visit.  We reviewed the resident's history and exam and pertinent patient test results.  I agree with the assessment, diagnosis, and plan of care documented in the resident's note. 

## 2016-02-13 NOTE — Telephone Encounter (Signed)
Pt daughter needs to speak with a nurse. Please call back.

## 2016-02-13 NOTE — Telephone Encounter (Signed)
I have tried calling multiple numbers that's listed in the demographics. Could not reach anyone.  1. I was told by her son that she is already taking all of the BP meds. That's why I continued it. If she is not taking them then please ask them not to restart the medications as her BP was low.   2. I would prefer to have on her statin just for the peripheral vascular disease al though she is 74. Aspirin and statin would probably be beneficial for this purpose but I  Would be ok not to have her on statin if daughter wishes against it.  3. We try our best to communicate with each other but sometimes we have to rely on patient and family members to help [place] with the right information. I feel that she should have come to the appointment if she did not think her brother would be able to provide accurate information about the patient.

## 2016-02-13 NOTE — Telephone Encounter (Signed)
Reviewing Dr. Marcelino Freestone assessment at her office visit yesterday, I agree about the blood pressure medications. I also wonder if we may need to have a discussion of her quality of life just reviewing her chronic illnesses which I will defer to her PCP, Dr. Mikey Bussing.

## 2016-02-14 ENCOUNTER — Telehealth: Payer: Self-pay

## 2016-02-14 DIAGNOSIS — M86171 Other acute osteomyelitis, right ankle and foot: Secondary | ICD-10-CM | POA: Diagnosis not present

## 2016-02-14 DIAGNOSIS — I509 Heart failure, unspecified: Secondary | ICD-10-CM | POA: Diagnosis not present

## 2016-02-14 DIAGNOSIS — E0500 Thyrotoxicosis with diffuse goiter without thyrotoxic crisis or storm: Secondary | ICD-10-CM | POA: Diagnosis not present

## 2016-02-14 NOTE — Telephone Encounter (Signed)
[name] is a 82 y.o. female who was contacted via telephone for monitoring of rivaroxaban (Xarelto) therapy.  Ms. Brittain was unavailable so I spoke to her son about her progress on DOAC therapy.  ASSESSMENT Indication(s): atrial fibrillation Duration: indefinite  Labs:    Component Value Date/Time   AST 17 01/26/2016 1609   ALT 7* 01/26/2016 1609   NA 134* 02/02/2016 0500   K 4.2 02/02/2016 0500   CL 97* 02/02/2016 0500   CO2 28 02/02/2016 0500   GLUCOSE 132* 02/02/2016 0500   HGBA1C 7.9* 01/27/2016 1554   HGBA1C 7.5 09/14/2015 1604   BUN 21* 02/02/2016 0500   CREATININE 0.91 02/02/2016 0500   CREATININE 0.66 11/01/2014 1605   CALCIUM 8.6* 02/02/2016 0500   GFRNONAA 55* 02/02/2016 0500   GFRNONAA 80 11/01/2014 1605   GFRAA >60 02/02/2016 0500   GFRAA >89 11/01/2014 1605   WBC 3.9 02/12/2016 1042   WBC 5.8 02/03/2016 0545   HGB 11.8* 02/03/2016 0545   HCT 40.2 02/12/2016 1042   HCT 37.9 02/03/2016 0545   PLT 359 02/12/2016 1042   PLT 364 02/03/2016 0545    rivaroxaban (Xarelto) Dose: 20mg  qd  Safety: Patient has not had recent bleeding/thromboembolic events. Patient reports no recent signs or symptoms of bleeding, no signs of symptoms of thromboembolism. Medication changes: no.  Adherence: Patient reports no known adherence challenges. Contacted pharmacy and records indicate refills are consistent.  Patient Instructions: Patient advised to contact clinic or seek medical attention if signs/symptoms of bleeding or thromboembolism occur. Patient verbalized understanding by repeating back information.  Follow-up Next appointment 7/3.  Lenward Chancellor PharmD Candidate  02/14/2016, 11:49 AM

## 2016-02-14 NOTE — Telephone Encounter (Signed)
Called [name] and left message to return call.

## 2016-02-14 NOTE — Telephone Encounter (Signed)
Tried to call daughter no answer, no vmail

## 2016-02-15 ENCOUNTER — Telehealth: Payer: Self-pay | Admitting: *Deleted

## 2016-02-15 DIAGNOSIS — E1151 Type 2 diabetes mellitus with diabetic peripheral angiopathy without gangrene: Secondary | ICD-10-CM | POA: Diagnosis not present

## 2016-02-15 DIAGNOSIS — Z9981 Dependence on supplemental oxygen: Secondary | ICD-10-CM | POA: Diagnosis not present

## 2016-02-15 DIAGNOSIS — M810 Age-related osteoporosis without current pathological fracture: Secondary | ICD-10-CM | POA: Diagnosis not present

## 2016-02-15 DIAGNOSIS — M86171 Other acute osteomyelitis, right ankle and foot: Secondary | ICD-10-CM | POA: Diagnosis not present

## 2016-02-15 DIAGNOSIS — I509 Heart failure, unspecified: Secondary | ICD-10-CM | POA: Diagnosis not present

## 2016-02-15 DIAGNOSIS — I4891 Unspecified atrial fibrillation: Secondary | ICD-10-CM | POA: Diagnosis not present

## 2016-02-15 DIAGNOSIS — Z7984 Long term (current) use of oral hypoglycemic drugs: Secondary | ICD-10-CM | POA: Diagnosis not present

## 2016-02-15 DIAGNOSIS — Z792 Long term (current) use of antibiotics: Secondary | ICD-10-CM | POA: Diagnosis not present

## 2016-02-15 DIAGNOSIS — Z452 Encounter for adjustment and management of vascular access device: Secondary | ICD-10-CM | POA: Diagnosis not present

## 2016-02-15 DIAGNOSIS — Z5181 Encounter for therapeutic drug level monitoring: Secondary | ICD-10-CM | POA: Diagnosis not present

## 2016-02-15 DIAGNOSIS — E0500 Thyrotoxicosis with diffuse goiter without thyrotoxic crisis or storm: Secondary | ICD-10-CM | POA: Diagnosis not present

## 2016-02-15 DIAGNOSIS — S91101D Unspecified open wound of right great toe without damage to nail, subsequent encounter: Secondary | ICD-10-CM | POA: Diagnosis not present

## 2016-02-15 DIAGNOSIS — M301 Polyarteritis with lung involvement [Churg-Strauss]: Secondary | ICD-10-CM | POA: Diagnosis not present

## 2016-02-15 DIAGNOSIS — I272 Other secondary pulmonary hypertension: Secondary | ICD-10-CM | POA: Diagnosis not present

## 2016-02-15 DIAGNOSIS — I110 Hypertensive heart disease with heart failure: Secondary | ICD-10-CM | POA: Diagnosis not present

## 2016-02-15 DIAGNOSIS — M19012 Primary osteoarthritis, left shoulder: Secondary | ICD-10-CM | POA: Diagnosis not present

## 2016-02-15 DIAGNOSIS — J45909 Unspecified asthma, uncomplicated: Secondary | ICD-10-CM | POA: Diagnosis not present

## 2016-02-15 DIAGNOSIS — Z7902 Long term (current) use of antithrombotics/antiplatelets: Secondary | ICD-10-CM | POA: Diagnosis not present

## 2016-02-15 DIAGNOSIS — L89610 Pressure ulcer of right heel, unstageable: Secondary | ICD-10-CM | POA: Diagnosis not present

## 2016-02-15 NOTE — Telephone Encounter (Signed)
Corrie Dandy, PT  from [facility] - wanted to know if there's a change in pt's weight bearing status. Stated after her OV here, family was told no wt bearing on right foot.  She needs to know so can plan pt's exercise program ; when she received notes upon d/c from hospital; pt was noted as weight bearing. [phone]

## 2016-02-15 NOTE — Telephone Encounter (Signed)
As long as there is dressing covering there wounds I think she would be ok to bear weight.

## 2016-02-15 NOTE — Telephone Encounter (Signed)
Called Mary,PT - no answer; left message "long as there is dressing covering wounds ,think she would be ok to bear weight" per Dr Tasia Catchings. And call if has any questions.

## 2016-02-15 NOTE — Telephone Encounter (Signed)
I have newly been assigned as [name]'s PCP, and I have yet to meet her, it may be good for [place] to have a visit with the daughter present.

## 2016-02-15 NOTE — Telephone Encounter (Signed)
Patient was contacted  by Tanya Makhlouf, PharmD candidate. I agree with the assessment and plan of care documented. 

## 2016-02-18 ENCOUNTER — Ambulatory Visit (INDEPENDENT_AMBULATORY_CARE_PROVIDER_SITE_OTHER): Payer: Medicare Other | Admitting: Internal Medicine

## 2016-02-18 VITALS — BP 130/71 | HR 110 | Temp 98.4°F

## 2016-02-18 DIAGNOSIS — Z5181 Encounter for therapeutic drug level monitoring: Secondary | ICD-10-CM | POA: Diagnosis not present

## 2016-02-18 DIAGNOSIS — M86171 Other acute osteomyelitis, right ankle and foot: Secondary | ICD-10-CM | POA: Diagnosis not present

## 2016-02-18 DIAGNOSIS — B9561 Methicillin susceptible Staphylococcus aureus infection as the cause of diseases classified elsewhere: Secondary | ICD-10-CM

## 2016-02-18 DIAGNOSIS — Z8619 Personal history of other infectious and parasitic diseases: Secondary | ICD-10-CM

## 2016-02-18 DIAGNOSIS — E0500 Thyrotoxicosis with diffuse goiter without thyrotoxic crisis or storm: Secondary | ICD-10-CM | POA: Diagnosis not present

## 2016-02-18 DIAGNOSIS — A4101 Sepsis due to Methicillin susceptible Staphylococcus aureus: Secondary | ICD-10-CM

## 2016-02-18 MED ORDER — TRAMADOL HCL 50 MG PO TABS: 50.0000 mg | ORAL_TABLET | Freq: Two times a day (BID) | ORAL | Status: DC | PRN

## 2016-02-18 NOTE — Progress Notes (Signed)
Patient ID: [name], female   DOB: Mar 30, 1926, 82 y.o.   MRN: [id number]     Subjective:   Patient ID: [name] female   DOB: 09-28-1925 82 y.o.   MRN: [id number]  HPI: [name] is a 82 y.o. woman who is here for a wound check of her right foot as a follow up from last week.    Please see Problem List/A&P for the status of the patient's chronic medical problems      Past Medical History  Diagnosis Date  . Hypertension   . Asthma   . Osteoarthritis     left shoulder  . Pseudogout   . Hypokalemia     a. Previously felt due to diuretics, req supplementation.  . Churg-Strauss syndrome (HCC)     a. Sural nerve biopsy 02/2000.  Darene Lamer disease     a. s/p thyroidectomy.  . Glaucoma   . Hypercholesteremia   . Venous insufficiency   . Osteopenia     DEXA 2004  . Lipoma     left inner thigh  . Cataract     left eye  . VASCULITIS   . Chronic combined systolic and diastolic heart failure (HCC)     a. 02/2012 EF:20-25%  . Pleural effusion, left     a. Thoracentesis 2001 - per notes, no malignant cells, was transudative.  Marland Kitchen NSVT (nonsustained ventricular tachycardia) (HCC)     a. During CHF adm 2001.  . Multifocal atrial tachycardia (HCC)     a. Documented as OP 01/2012.  Marland Kitchen Ectopic atrial tachycardia (HCC)     a. Documented on tele as IP 02/2012.  . Pulmonary HTN (HCC)     a. Mod by echo 02/2012.  . Valvular heart disease     a. Echo 02/2012: mod MR/TR, mild AI.  Marland Kitchen Pericardial effusion     a. Echo 02/2012: small-mod pericardial effusion.  . Atrial fibrillation, permanent (HCC)     a. Dx 04/2013, on metoprolol, dig and xarelto  . Shortness of breath   . CHF (congestive heart failure) (HCC)   . On home oxygen therapy     "wear it mostly at night" (09/20/2014)  . Pneumonia 1930's  . Type II diabetes mellitus (HCC)    Current Outpatient Prescriptions  Medication Sig Dispense Refill  . acetaminophen-codeine (TYLENOL #3) 300-30 MG tablet Take 1 tablet by mouth  every 8 (eight) hours as needed (for pain). 21 tablet 0  . albuterol (PROVENTIL HFA;VENTOLIN HFA) 108 (90 Base) MCG/ACT inhaler Inhale 2 puffs into the lungs every 6 (six) hours as needed for wheezing or shortness of breath. 1 Inhaler 2  . Amino Acids-Protein Hydrolys (FEEDING SUPPLEMENT, PRO-STAT SUGAR FREE 64,) LIQD Take 30 mLs by mouth daily at 3 pm. 900 mL 0  . aspirin EC 81 MG tablet Take 1 tablet (81 mg total) by mouth daily. 90 tablet 2  . atorvastatin (LIPITOR) 40 MG tablet Take 1 tablet (40 mg total) by mouth daily. 30 tablet 11  . BD ULTRA-FINE LANCETS lancets Use to check blood sugar one time daily 100 each 5  . brimonidine (ALPHAGAN P) 0.1 % SOLN Place 1 drop into the left eye every 12 (twelve) hours. 45 mL 0  . ceFAZolin (ANCEF) 1-5 GM-% Inject 50 mLs (1 g total) into the vein every 8 (eight) hours. 50 mL 0  . dorzolamide (TRUSOPT) 2 % ophthalmic solution Place 1 drop into the left eye 2 (two) times daily. 10 mL 3  .  DULERA 100-5 MCG/ACT AERO INHALE 2 PUFFS INTO THE LUNGS TWICE DAILY 13 g 3  . ferrous sulfate 325 (65 FE) MG tablet TAKE 1 TABLET(325 MG) BY MOUTH DAILY 30 tablet 3  . furosemide (LASIX) 80 MG tablet Take 80mg  in the AM and 40mg  in the PM 45 tablet 3  . glucose blood test strip Use as instructed 100 each 12  . lisinopril (PRINIVIL,ZESTRIL) 20 MG tablet Take 1 tablet (20 mg total) by mouth daily. 30 tablet 3  . metFORMIN (GLUCOPHAGE) 1000 MG tablet TAKE 1 TABLET(1000 MG) BY MOUTH TWICE DAILY WITH A MEAL 180 tablet 1  . metoprolol succinate (TOPROL-XL) 100 MG 24 hr tablet Take 1 tablet daily 60 tablet 0  . potassium chloride SA (K-DUR,KLOR-CON) 20 MEQ tablet Take 2 tablets (40 mEq total) by mouth daily. Only take this medication on days you take Lasix. 60 tablet 0  . ranitidine (ZANTAC) 75 MG tablet Take 75 mg by mouth daily as needed for heartburn.    . rivaroxaban (XARELTO) 20 MG TABS tablet Take 20 mg by mouth daily with supper.    Marland Kitchen spironolactone (ALDACTONE) 25 MG  tablet TAKE 1 TABLET BY MOUTH EVERY DAY 90 tablet 1   No current facility-administered medications for this visit.   Family History  Problem Relation Age of Onset  . Diabetes Mother   . Diabetes Father   . Heart disease Neg Hx    Social History   Social History  . Marital Status: Widowed    Spouse Name: N/A  . Number of Children: N/A  . Years of Education: N/A   Occupational History  . Retired    Social History Main Topics  . Smoking status: Never Smoker   . Smokeless tobacco: Never Used  . Alcohol Use: No  . Drug Use: No  . Sexual Activity: Not Asked   Other Topics Concern  . None   Social History Narrative   Lives in house, with family just next door.    Never smoker, no alcohol.      Used to work at [employer].    Before that, worked in food prep.                   Review of Systems: A comprehensive ROS is obtained and is noted in either in HPI or in problem list, and remainder of comprehensive ROS otherwise negative   Constitutional: Negative for fever, chills, weight loss and malaise/fatigue.  Respiratory: Negative for cough, shortness of breath and wheezing.  Extremities: No purulent drainage from the right foot    Objective:  Physical Exam: Filed Vitals:   02/18/16 0830  BP: 130/71  Pulse: 110  Temp: 98.4 F (36.9 C)  TempSrc: Oral  SpO2: 98%    General: A&O, in NAD CV: tachycardic, and irregular rhythm  Resp: equal and symmetric breath sounds, no wheezing or crackles  Extremities: faint pulses bilaterally                     Right foot has healing wound, nondraining, non-erythematous    Assessment & Plan:   Case discussed with Dr Heide Spark Please see problem based charting for assessment and plan

## 2016-02-18 NOTE — Telephone Encounter (Signed)
Spoke with [name], says that they clarified everything at her mom's appointment this morning. No more concerns.

## 2016-02-18 NOTE — Assessment & Plan Note (Signed)
Patient had presumed osteomyelitis of the 1st metatarsal head along with the MSSA bacteremia for which she is getting IV antibiotics through July 23rd. She is here for a follow up visit from last week, and has an appt with wound care next week. Patient denies any fevers, chills, any purulent drainage from the site. She is not able to ambulate on the feet. Was accompanied by her son. On exam, the wound seems to be healing. No drainage or erythema appreciated.  She was given Tylenol #3 21 tablets last week for the pain. Patient says it is not working and currently describes the pain as 10/10 mainly in her right foot.  --re-dressed the wound by Ms Purnell Shoemaker -encouraged to follow up with wound care -given tramadol 20 tablets with no refills -Follow up with PCP

## 2016-02-18 NOTE — Patient Instructions (Signed)
Thank you for your visit today Please follow up with wound care next week

## 2016-02-18 NOTE — Assessment & Plan Note (Signed)
CBC was checked at the last visit- white count normal. Patiet is currently taking cefazolin q8 hours via PICC line, end date 03/09/2016

## 2016-02-19 DIAGNOSIS — Z5181 Encounter for therapeutic drug level monitoring: Secondary | ICD-10-CM | POA: Diagnosis not present

## 2016-02-19 DIAGNOSIS — I509 Heart failure, unspecified: Secondary | ICD-10-CM | POA: Diagnosis not present

## 2016-02-19 DIAGNOSIS — J45909 Unspecified asthma, uncomplicated: Secondary | ICD-10-CM | POA: Diagnosis not present

## 2016-02-19 DIAGNOSIS — M19012 Primary osteoarthritis, left shoulder: Secondary | ICD-10-CM | POA: Diagnosis not present

## 2016-02-19 DIAGNOSIS — M810 Age-related osteoporosis without current pathological fracture: Secondary | ICD-10-CM | POA: Diagnosis not present

## 2016-02-19 DIAGNOSIS — M301 Polyarteritis with lung involvement [Churg-Strauss]: Secondary | ICD-10-CM | POA: Diagnosis not present

## 2016-02-19 DIAGNOSIS — I272 Other secondary pulmonary hypertension: Secondary | ICD-10-CM | POA: Diagnosis not present

## 2016-02-19 DIAGNOSIS — S91309A Unspecified open wound, unspecified foot, initial encounter: Secondary | ICD-10-CM | POA: Diagnosis not present

## 2016-02-19 DIAGNOSIS — Z792 Long term (current) use of antibiotics: Secondary | ICD-10-CM | POA: Diagnosis not present

## 2016-02-19 DIAGNOSIS — I4891 Unspecified atrial fibrillation: Secondary | ICD-10-CM | POA: Diagnosis not present

## 2016-02-19 DIAGNOSIS — Z7984 Long term (current) use of oral hypoglycemic drugs: Secondary | ICD-10-CM | POA: Diagnosis not present

## 2016-02-19 DIAGNOSIS — T83498A Other mechanical complication of other prosthetic devices, implants and grafts of genital tract, initial encounter: Secondary | ICD-10-CM | POA: Diagnosis not present

## 2016-02-19 DIAGNOSIS — M86171 Other acute osteomyelitis, right ankle and foot: Secondary | ICD-10-CM | POA: Diagnosis not present

## 2016-02-19 DIAGNOSIS — Z7902 Long term (current) use of antithrombotics/antiplatelets: Secondary | ICD-10-CM | POA: Diagnosis not present

## 2016-02-19 DIAGNOSIS — T83198A Other mechanical complication of other urinary devices and implants, initial encounter: Secondary | ICD-10-CM | POA: Diagnosis not present

## 2016-02-19 DIAGNOSIS — Z452 Encounter for adjustment and management of vascular access device: Secondary | ICD-10-CM | POA: Diagnosis not present

## 2016-02-19 DIAGNOSIS — I110 Hypertensive heart disease with heart failure: Secondary | ICD-10-CM | POA: Diagnosis not present

## 2016-02-19 DIAGNOSIS — L89610 Pressure ulcer of right heel, unstageable: Secondary | ICD-10-CM | POA: Diagnosis not present

## 2016-02-19 DIAGNOSIS — E1151 Type 2 diabetes mellitus with diabetic peripheral angiopathy without gangrene: Secondary | ICD-10-CM | POA: Diagnosis not present

## 2016-02-19 DIAGNOSIS — S91101D Unspecified open wound of right great toe without damage to nail, subsequent encounter: Secondary | ICD-10-CM | POA: Diagnosis not present

## 2016-02-19 DIAGNOSIS — E0500 Thyrotoxicosis with diffuse goiter without thyrotoxic crisis or storm: Secondary | ICD-10-CM | POA: Diagnosis not present

## 2016-02-19 DIAGNOSIS — Z9981 Dependence on supplemental oxygen: Secondary | ICD-10-CM | POA: Diagnosis not present

## 2016-02-20 ENCOUNTER — Encounter (HOSPITAL_COMMUNITY): Payer: Self-pay | Admitting: Emergency Medicine

## 2016-02-20 ENCOUNTER — Emergency Department (HOSPITAL_COMMUNITY)
Admission: EM | Admit: 2016-02-20 | Discharge: 2016-02-20 | Disposition: A | Discharge status: Home / Self Care | Payer: Medicare Other | Attending: Emergency Medicine | Admitting: Emergency Medicine

## 2016-02-20 ENCOUNTER — Emergency Department (HOSPITAL_COMMUNITY): Payer: Medicare Other

## 2016-02-20 DIAGNOSIS — I5042 Chronic combined systolic (congestive) and diastolic (congestive) heart failure: Secondary | ICD-10-CM | POA: Insufficient documentation

## 2016-02-20 DIAGNOSIS — Z7901 Long term (current) use of anticoagulants: Secondary | ICD-10-CM | POA: Insufficient documentation

## 2016-02-20 DIAGNOSIS — Y828 Other medical devices associated with adverse incidents: Secondary | ICD-10-CM | POA: Diagnosis not present

## 2016-02-20 DIAGNOSIS — E119 Type 2 diabetes mellitus without complications: Secondary | ICD-10-CM | POA: Insufficient documentation

## 2016-02-20 DIAGNOSIS — J9811 Atelectasis: Secondary | ICD-10-CM | POA: Diagnosis not present

## 2016-02-20 DIAGNOSIS — Z7984 Long term (current) use of oral hypoglycemic drugs: Secondary | ICD-10-CM | POA: Insufficient documentation

## 2016-02-20 DIAGNOSIS — I110 Hypertensive heart disease with heart failure: Secondary | ICD-10-CM | POA: Insufficient documentation

## 2016-02-20 DIAGNOSIS — T82868A Thrombosis of vascular prosthetic devices, implants and grafts, initial encounter: Secondary | ICD-10-CM | POA: Insufficient documentation

## 2016-02-20 DIAGNOSIS — J45909 Unspecified asthma, uncomplicated: Secondary | ICD-10-CM | POA: Insufficient documentation

## 2016-02-20 DIAGNOSIS — T82838A Hemorrhage of vascular prosthetic devices, implants and grafts, initial encounter: Secondary | ICD-10-CM | POA: Diagnosis present

## 2016-02-20 DIAGNOSIS — Z7982 Long term (current) use of aspirin: Secondary | ICD-10-CM | POA: Diagnosis not present

## 2016-02-20 DIAGNOSIS — T82898A Other specified complication of vascular prosthetic devices, implants and grafts, initial encounter: Secondary | ICD-10-CM

## 2016-02-20 MED ORDER — HEPARIN SOD (PORK) LOCK FLUSH 100 UNIT/ML IV SOLN
250.0000 [IU] | INTRAVENOUS | Status: AC | PRN
  Administered 2016-02-20: 250 [IU]

## 2016-02-20 NOTE — ED Notes (Signed)
Pt in EMS from home PICC line RUA for IV therapy. Family went to flush this evening, noticed blood around site and unable to flush. PICC line intact but appears to be pulled out a few centimeters. Bleeding noted to site at this time, bleeding controlled

## 2016-02-20 NOTE — Progress Notes (Signed)
[facility] Attending  Case discussed with Dr. Saraiya at the time of the visit.  We reviewed the resident's history and exam and pertinent patient test results.  I agree with the assessment, diagnosis, and plan of care documented in the resident's note.  

## 2016-02-20 NOTE — Discharge Instructions (Signed)
PICC Home Guide A peripherally inserted central catheter (PICC) is a long, thin, flexible tube that is inserted into a vein in the upper arm. It is a form of intravenous (IV) access. It is considered to be a "central" line because the tip of the PICC ends in a large vein in your chest. This large vein is called the superior vena cava (SVC). The PICC tip ends in the SVC because there is a lot of blood flow in the SVC. This allows medicines and IV fluids to be quickly distributed throughout the body. The PICC is inserted using a sterile technique by a specially trained nurse or physician. After the PICC is inserted, a chest X-ray exam is done to be sure it is in the correct place.  A PICC may be placed for different reasons, such as:  To give medicines and liquid nutrition that can only be given through a central line. Examples are:  Certain antibiotic treatments.  Chemotherapy.  Total parenteral nutrition (TPN).  To take frequent blood samples.  To give IV fluids and blood products.  If there is difficulty placing a peripheral intravenous (PIV) catheter. If taken care of properly, a PICC can remain in place for several months. A PICC can also allow a person to go home from the hospital early. Medicine and PICC care can be managed at home by a family member or home health care team. WHAT PROBLEMS CAN HAPPEN WHEN I HAVE A PICC? Problems with a PICC can occasionally occur. These may include the following:  A blood clot (thrombus) forming in or at the tip of the PICC. This can cause the PICC to become clogged. A clot-dissolving medicine called tissue plasminogen activator (tPA) can be given through the PICC to help break up the clot.  Inflammation of the vein (phlebitis) in which the PICC is placed. Signs of inflammation may include redness, pain at the insertion site, red streaks, or being able to feel a "cord" in the vein where the PICC is located.  Infection in the PICC or at the insertion  site. Signs of infection may include fever, chills, redness, swelling, or pus drainage from the PICC insertion site.  PICC movement (malposition). The PICC tip may move from its original position due to excessive physical activity, forceful coughing, sneezing, or vomiting.  A break or cut in the PICC. It is important to not use scissors near the PICC.  Nerve or tendon irritation or injury during PICC insertion. WHAT SHOULD I KEEP IN MIND ABOUT ACTIVITIES WHEN I HAVE A PICC?  You may bend your arm and move it freely. If your PICC is near or at the bend of your elbow, avoid activity with repeated motion at the elbow.  Rest at home for the remainder of the day following PICC line insertion.  Avoid lifting heavy objects as instructed by your health care provider.  Avoid using a crutch with the arm on the same side as your PICC. You may need to use a walker. WHAT SHOULD I KNOW ABOUT MY PICC DRESSING?  Keep your PICC bandage (dressing) clean and dry to prevent infection.  Ask your health care provider when you may shower. Ask your health care provider to teach you how to wrap the PICC when you do take a shower.  Change the PICC dressing as instructed by your health care provider.  Change your PICC dressing if it becomes loose or wet. WHAT SHOULD I KNOW ABOUT PICC CARE?  Check the PICC insertion site   daily for leakage, redness, swelling, or pain.  Do not take a bath, swim, or use hot tubs when you have a PICC. Cover PICC line with clear plastic wrap and tape to keep it dry while showering.  Flush the PICC as directed by your health care provider. Let your health care provider know right away if the PICC is difficult to flush or does not flush. Do not use force to flush the PICC.  Do not use a syringe that is less than 10 mL to flush the PICC.  Never pull or tug on the PICC.  Avoid blood pressure checks on the arm with the PICC.  Keep your PICC identification card with you at all  times.  Do not take the PICC out yourself. Only a trained clinical professional should remove the PICC. SEEK IMMEDIATE MEDICAL CARE IF:  Your PICC is accidentally pulled all the way out. If this happens, cover the insertion site with a bandage or gauze dressing. Do not throw the PICC away. Your health care provider will need to inspect it.  Your PICC was tugged or pulled and has partially come out. Do not  push the PICC back in.  There is any type of drainage, redness, or swelling where the PICC enters the skin.  You cannot flush the PICC, it is difficult to flush, or the PICC leaks around the insertion site when it is flushed.  You hear a "flushing" sound when the PICC is flushed.  You have pain, discomfort, or numbness in your arm, shoulder, or jaw on the same side as the PICC.  You feel your heart "racing" or skipping beats.  You notice a hole or tear in the PICC.  You develop chills or a fever. MAKE SURE YOU:   Understand these instructions.  Will watch your condition.  Will get help right away if you are not doing well or get worse.   This information is not intended to replace advice given to you by your health care provider. Make sure you discuss any questions you have with your health care provider.   Document Released: 02/08/2003 Document Revised: 08/25/2014 Document Reviewed: 04/11/2013 Elsevier Interactive Patient Education 2016 [employer]  

## 2016-02-20 NOTE — ED Provider Notes (Signed)
CSN: [id number]     Arrival date & time 02/20/16  0031 History  By signing my name below, I, [name], attest that this documentation has been prepared under the direction and in the presence of Wendy Crease, MD. Electronically signed, [name], ED Scribe. 02/20/2016. 12:47 AM.   Chief Complaint  Patient presents with  . Vascular Access Problem   The history is provided by the patient and the EMS personnel. No language interpreter was used.   HPI Comments: [name] is a 82 y.o. female brought in by ambulance, who presents to the Emergency Department after family called 911 stating that the pt's picc line became dislodged this evening. Per EMS, family stated that they arrived home from watching fireworks tonight and noticed bleeding around the picc line in the right arm. Pt's family also stated that they attempted to flush the area with water with no relief. Pt reports pain to the area. Pt denies any known injury.   Past Medical History  Diagnosis Date  . Hypertension   . Asthma   . Osteoarthritis     left shoulder  . Pseudogout   . Hypokalemia     a. Previously felt due to diuretics, req supplementation.  . Churg-Strauss syndrome (HCC)     a. Sural nerve biopsy 02/2000.  [name]     a. s/p thyroidectomy.  . Glaucoma   . Hypercholesteremia   . Venous insufficiency   . Osteopenia     DEXA 2004  . Lipoma     left inner thigh  . Cataract     left eye  . VASCULITIS   . Chronic combined systolic and diastolic heart failure (HCC)     a. 02/2012 EF:20-25%  . Pleural effusion, left     a. Thoracentesis 2001 - per notes, no malignant cells, was transudative.  [name] NSVT (nonsustained ventricular tachycardia) (HCC)     a. During CHF adm 2001.  . Multifocal atrial tachycardia (HCC)     a. Documented as OP 01/2012.  [name] Ectopic atrial tachycardia (HCC)     a. Documented on tele as IP 02/2012.  . Pulmonary HTN (HCC)     a. Mod by echo 02/2012.  . Valvular heart  Lowery     a. Echo 02/2012: mod MR/TR, mild AI.  [name] Pericardial effusion     a. Echo 02/2012: small-mod pericardial effusion.  . Atrial fibrillation, permanent (HCC)     a. Dx 04/2013, on metoprolol, dig and xarelto  . Shortness of breath   . CHF (congestive heart failure) (HCC)   . On home oxygen therapy     "wear it mostly at night" (09/20/2014)  . Pneumonia 1930's  . Type II diabetes mellitus [employer])    Past Surgical History  Procedure Laterality Date  . Abdominal hysterectomy    . Salpingoophorectomy    . Thyroidectomy    . Hemiarthroplasty hip Right 10/2010    cemented for hip fracture, femoral neck - by Eulas Post, MD  . Cataract extraction Right   . Fracture surgery    . Sural nerve biopsy  02/2000  . Peripheral vascular catheterization N/A 01/30/2016    Procedure: Abdominal Aortogram w/Lower Extremity;  Surgeon: Nada Libman, MD;  Location: [facility];  Service: Cardiovascular;  Laterality: N/A;  . Peripheral vascular catheterization Right 01/30/2016    Procedure: Peripheral Vascular Balloon Angioplasty;  Surgeon: Nada Libman, MD;  Location: [facility] CV [facility];  Service: Cardiovascular;  Laterality: Right;  Posterior tibial   Family History  Problem Relation Age of Onset  . Diabetes Mother   . Diabetes Father   . Heart Lowery Neg Hx    Social History  Substance Use Topics  . Smoking status: Never Smoker   . Smokeless tobacco: Never Used  . Alcohol Use: No   OB History    No data available     Review of Systems  Constitutional: Negative for fever.  Musculoskeletal: Positive for arthralgias (right foot).  All other systems reviewed and are negative.   Allergies  Amiodarone  Home Medications   Prior to Admission medications   Medication Sig Start Date End Date Taking? Authorizing Provider  albuterol (PROVENTIL HFA;VENTOLIN HFA) 108 (90 Base) MCG/ACT inhaler Inhale 2 puffs into the lungs every 6 (six) hours as needed for wheezing or shortness of  breath. 09/18/15  Yes Yolanda Manges, DO  Amino Acids-Protein Hydrolys (FEEDING SUPPLEMENT, PRO-STAT SUGAR FREE 64,) LIQD Take 30 mLs by mouth daily at 3 pm. 02/03/16  Yes Nishant Dhungel, MD  aspirin EC 81 MG tablet Take 1 tablet (81 mg total) by mouth daily. 02/12/16 02/11/17 Yes Tasrif Ahmed, MD  atorvastatin (LIPITOR) 40 MG tablet Take 1 tablet (40 mg total) by mouth daily. 02/12/16 02/11/17 Yes Tasrif Ahmed, MD  brimonidine (ALPHAGAN P) 0.1 % SOLN Place 1 drop into the left eye every 12 (twelve) hours. 07/11/15  Yes Darrick Huntsman, MD  ceFAZolin (ANCEF) 1-5 GM-% Inject 50 mLs (1 g total) into the vein every 8 (eight) hours. 02/03/16 03/09/16 Yes Nishant Dhungel, MD  dorzolamide (TRUSOPT) 2 % ophthalmic solution Place 1 drop into the left eye 2 (two) times daily. 07/11/15  Yes Darrick Huntsman, MD  DULERA 100-5 MCG/ACT AERO INHALE 2 PUFFS INTO THE LUNGS TWICE DAILY 09/07/15  Yes Yolanda Manges, DO  ferrous sulfate 325 (65 FE) MG tablet TAKE 1 TABLET(325 MG) BY MOUTH DAILY 07/11/15  Yes Darrick Huntsman, MD  furosemide (LASIX) 80 MG tablet Take  in the AM and  in the PM 01/22/16  Yes Laurey Morale, MD  lisinopril (PRINIVIL,ZESTRIL) 20 MG tablet Take 1 tablet (20 mg total) by mouth daily. 02/05/16  Yes Gust Rung, DO  metFORMIN (GLUCOPHAGE) 1000 MG tablet TAKE 1 TABLET(1000 MG) BY MOUTH TWICE DAILY WITH A MEAL 09/20/15  Yes Yolanda Manges, DO  metoprolol succinate (TOPROL-XL) 100 MG 24 hr tablet Take 1 tablet daily 02/03/16  Yes Nishant Dhungel, MD  potassium chloride SA (K-DUR,KLOR-CON) 20 MEQ tablet Take 2 tablets (40 mEq total) by mouth daily. Only take this medication on days you take Lasix. 11/17/14  Yes Alex Elson Clan, DO  ranitidine (ZANTAC) 75 MG tablet Take 75 mg by mouth daily as needed for heartburn.   Yes Historical Provider, MD  rivaroxaban (XARELTO) 20 MG TABS tablet Take 20 mg by mouth daily with supper.   Yes Historical Provider, MD  spironolactone (ALDACTONE) 25 MG tablet TAKE 1  TABLET BY MOUTH EVERY DAY 05/31/15  Yes Yolanda Manges, DO  traMADol (ULTRAM) 50 MG tablet Take 1 tablet (50 mg total) by mouth every 12 (twelve) hours as needed. 02/18/16 02/17/17 Yes Deneise Lever, MD  BD ULTRA-FINE LANCETS lancets Use to check blood sugar one time daily 08/23/15   Jana Half, MD  glucose blood test strip Use as instructed 07/11/15   Darrick Huntsman, MD   BP 105/67 mmHg  Pulse 80  Temp(Src) 98.4 F (36.9 C) (  Oral)  Resp 16  SpO2 95% Physical Exam  Constitutional: She is oriented to person, place, and time. She appears well-developed and well-nourished. No distress.  HENT:  Head: Normocephalic and atraumatic.  Right Ear: Hearing normal.  Left Ear: Hearing normal.  Nose: Nose normal.  Mouth/Throat: Oropharynx is clear and moist and mucous membranes are normal.  Eyes: Conjunctivae and EOM are normal. Pupils are equal, round, and reactive to light.  Neck: Normal range of motion. Neck supple.  Cardiovascular: Regular rhythm, S1 normal and S2 normal.  Exam reveals no gallop and no friction rub.   No murmur heard. Pulmonary/Chest: Effort normal and breath sounds normal. No respiratory distress. She exhibits no tenderness.  Abdominal: Soft. Normal appearance and bowel sounds are normal. There is no hepatosplenomegaly. There is no tenderness. There is no rebound, no guarding, no tenderness at McBurney's point and negative Murphy's sign. No hernia.  Musculoskeletal: Normal range of motion.  Neurological: She is alert and oriented to person, place, and time. She has normal strength. No cranial nerve deficit or sensory deficit. Coordination normal. GCS eye subscore is 4. GCS verbal subscore is 5. GCS motor subscore is 6.  Skin: Skin is warm, dry and intact. No rash noted. No cyanosis or erythema.  Picc line in right upper arm with small amount of blood with no erythema or drainage. Bandaged ulcer on right heel and right first mtp joint.  Psychiatric: She has a normal mood and  affect. Her speech is normal and behavior is normal. Thought content normal.  Nursing note and vitals reviewed.   ED Course  Procedures  DIAGNOSTIC STUDIES: Oxygen Saturation is 98% on RA, normal by my interpretation.  COORDINATION OF CARE: 12:46 AM-Will order imaging. Discussed treatment plan with pt at bedside and pt agreed to plan.   Labs Review Labs Reviewed - No data to display  Imaging Review Dg Chest Port 1 View  02/20/2016  CLINICAL DATA:  Check right PICC placement.  Initial encounter. EXAM: PORTABLE CHEST 1 VIEW COMPARISON:  Chest radiograph performed 02/02/2016 FINDINGS: The patient's right PICC is noted ending about the mid SVC. The lungs are well-aerated. Minimal left basilar atelectasis is noted. There is no evidence of pleural effusion or pneumothorax. The cardiomediastinal silhouette is enlarged. No acute osseous abnormalities are seen. IMPRESSION: 1. Right PICC noted ending about the mid SVC. 2. Minimal left basilar atelectasis noted. 3. Cardiomegaly. Electronically Signed   By: Roanna Raider M.D.   On: 02/20/2016 00:58   I have personally reviewed and evaluated these images and lab results as part of my medical decision-making.   EKG Interpretation None      MDM   Final diagnoses:  Occluded PICC line, initial encounter (HCC)    Presents to the emergency department for evaluation of possible PIC line problem. Family noted that there was some bleeding from around the PICC line of her right arm and they had difficulty flushing the PICC line. Upon arrival to the ER, examination did not reveal any evidence of obvious dislodgment. There was some blood under the dressing covering the area but no signs of infection. No active bleeding. X-ray confirmed that the tip of the line was in appropriate placement. Nursing staff has been able to flush the line and it is therefore felt that it is fully functional. Patient to be discharged and can utilize the line.  I personally  performed the services described in this documentation, which was scribed in my presence. The recorded information has been reviewed  and is accurate.       Wendy Crease, MD 02/20/16 [phone]

## 2016-02-21 ENCOUNTER — Telehealth: Payer: Self-pay | Admitting: Internal Medicine

## 2016-02-21 DIAGNOSIS — M810 Age-related osteoporosis without current pathological fracture: Secondary | ICD-10-CM | POA: Diagnosis not present

## 2016-02-21 DIAGNOSIS — J45909 Unspecified asthma, uncomplicated: Secondary | ICD-10-CM | POA: Diagnosis not present

## 2016-02-21 DIAGNOSIS — Z792 Long term (current) use of antibiotics: Secondary | ICD-10-CM | POA: Diagnosis not present

## 2016-02-21 DIAGNOSIS — E1151 Type 2 diabetes mellitus with diabetic peripheral angiopathy without gangrene: Secondary | ICD-10-CM | POA: Diagnosis not present

## 2016-02-21 DIAGNOSIS — L89610 Pressure ulcer of right heel, unstageable: Secondary | ICD-10-CM | POA: Diagnosis not present

## 2016-02-21 DIAGNOSIS — Z452 Encounter for adjustment and management of vascular access device: Secondary | ICD-10-CM | POA: Diagnosis not present

## 2016-02-21 DIAGNOSIS — Z7902 Long term (current) use of antithrombotics/antiplatelets: Secondary | ICD-10-CM | POA: Diagnosis not present

## 2016-02-21 DIAGNOSIS — E0500 Thyrotoxicosis with diffuse goiter without thyrotoxic crisis or storm: Secondary | ICD-10-CM | POA: Diagnosis not present

## 2016-02-21 DIAGNOSIS — I4891 Unspecified atrial fibrillation: Secondary | ICD-10-CM | POA: Diagnosis not present

## 2016-02-21 DIAGNOSIS — Z5181 Encounter for therapeutic drug level monitoring: Secondary | ICD-10-CM | POA: Diagnosis not present

## 2016-02-21 DIAGNOSIS — I110 Hypertensive heart disease with heart failure: Secondary | ICD-10-CM | POA: Diagnosis not present

## 2016-02-21 DIAGNOSIS — Z9981 Dependence on supplemental oxygen: Secondary | ICD-10-CM | POA: Diagnosis not present

## 2016-02-21 DIAGNOSIS — Z7984 Long term (current) use of oral hypoglycemic drugs: Secondary | ICD-10-CM | POA: Diagnosis not present

## 2016-02-21 DIAGNOSIS — I272 Other secondary pulmonary hypertension: Secondary | ICD-10-CM | POA: Diagnosis not present

## 2016-02-21 DIAGNOSIS — M301 Polyarteritis with lung involvement [Churg-Strauss]: Secondary | ICD-10-CM | POA: Diagnosis not present

## 2016-02-21 DIAGNOSIS — S91101D Unspecified open wound of right great toe without damage to nail, subsequent encounter: Secondary | ICD-10-CM | POA: Diagnosis not present

## 2016-02-21 DIAGNOSIS — M19012 Primary osteoarthritis, left shoulder: Secondary | ICD-10-CM | POA: Diagnosis not present

## 2016-02-21 DIAGNOSIS — I509 Heart failure, unspecified: Secondary | ICD-10-CM | POA: Diagnosis not present

## 2016-02-21 DIAGNOSIS — M86171 Other acute osteomyelitis, right ankle and foot: Secondary | ICD-10-CM | POA: Diagnosis not present

## 2016-02-21 NOTE — Telephone Encounter (Signed)
Called, no answer or VM

## 2016-02-21 NOTE — Telephone Encounter (Signed)
Callin about wound on pt foot

## 2016-02-22 NOTE — Telephone Encounter (Signed)
No answer or VM

## 2016-02-24 DIAGNOSIS — I509 Heart failure, unspecified: Secondary | ICD-10-CM | POA: Diagnosis not present

## 2016-02-25 DIAGNOSIS — M86171 Other acute osteomyelitis, right ankle and foot: Secondary | ICD-10-CM | POA: Diagnosis not present

## 2016-02-25 DIAGNOSIS — Z5181 Encounter for therapeutic drug level monitoring: Secondary | ICD-10-CM | POA: Diagnosis not present

## 2016-02-25 DIAGNOSIS — E0500 Thyrotoxicosis with diffuse goiter without thyrotoxic crisis or storm: Secondary | ICD-10-CM | POA: Diagnosis not present

## 2016-02-26 ENCOUNTER — Telehealth: Payer: Self-pay | Admitting: *Deleted

## 2016-02-26 NOTE — Telephone Encounter (Signed)
Received call from [facility])-he saw patient yesterday for wound care and noticed a lot of drainage.  He sees pt once a week and will be back out to see pt on Monday.  In the interim, he would like to know what he should be doing for the wound.  [facility] appt [facility].  I asked that [facility] follow-up with our office in 1-2 days to see what recommendations the wound care center make.  I also contacted pt's family to make sure she gets to her appt tomorrow.  Pt's dtr states that her mom is always "crying" about the pain and the rx given to her last visit doesn't seem to be helping her . She is asking for something different if possible and if not can she get another refill on the tramadol as she has about 4-5 pills left.  Will forward to

## 2016-02-27 ENCOUNTER — Encounter (HOSPITAL_BASED_OUTPATIENT_CLINIC_OR_DEPARTMENT_OTHER): Payer: Medicare Other | Attending: Internal Medicine

## 2016-02-27 DIAGNOSIS — H409 Unspecified glaucoma: Secondary | ICD-10-CM | POA: Diagnosis not present

## 2016-02-27 DIAGNOSIS — E11621 Type 2 diabetes mellitus with foot ulcer: Secondary | ICD-10-CM | POA: Insufficient documentation

## 2016-02-27 DIAGNOSIS — M301 Polyarteritis with lung involvement [Churg-Strauss]: Secondary | ICD-10-CM | POA: Insufficient documentation

## 2016-02-27 DIAGNOSIS — M86171 Other acute osteomyelitis, right ankle and foot: Secondary | ICD-10-CM | POA: Insufficient documentation

## 2016-02-27 DIAGNOSIS — I5022 Chronic systolic (congestive) heart failure: Secondary | ICD-10-CM | POA: Insufficient documentation

## 2016-02-27 DIAGNOSIS — E1169 Type 2 diabetes mellitus with other specified complication: Secondary | ICD-10-CM | POA: Insufficient documentation

## 2016-02-27 DIAGNOSIS — E1152 Type 2 diabetes mellitus with diabetic peripheral angiopathy with gangrene: Secondary | ICD-10-CM | POA: Insufficient documentation

## 2016-02-27 DIAGNOSIS — L97514 Non-pressure chronic ulcer of other part of right foot with necrosis of bone: Secondary | ICD-10-CM | POA: Insufficient documentation

## 2016-02-27 DIAGNOSIS — I110 Hypertensive heart disease with heart failure: Secondary | ICD-10-CM | POA: Diagnosis not present

## 2016-02-27 DIAGNOSIS — M109 Gout, unspecified: Secondary | ICD-10-CM | POA: Insufficient documentation

## 2016-02-27 DIAGNOSIS — I080 Rheumatic disorders of both mitral and aortic valves: Secondary | ICD-10-CM | POA: Diagnosis not present

## 2016-02-27 DIAGNOSIS — I70235 Atherosclerosis of native arteries of right leg with ulceration of other part of foot: Secondary | ICD-10-CM | POA: Insufficient documentation

## 2016-02-27 DIAGNOSIS — L97511 Non-pressure chronic ulcer of other part of right foot limited to breakdown of skin: Secondary | ICD-10-CM | POA: Diagnosis not present

## 2016-02-28 ENCOUNTER — Ambulatory Visit (HOSPITAL_COMMUNITY)
Admit: 2016-02-28 | Discharge: 2016-02-28 | Disposition: A | Discharge status: Home / Self Care | Payer: Medicare Other | Source: Ambulatory Visit | Attending: Family | Admitting: Family

## 2016-02-28 ENCOUNTER — Encounter: Payer: Self-pay | Admitting: Family

## 2016-02-28 ENCOUNTER — Other Ambulatory Visit: Payer: Self-pay | Admitting: Vascular Surgery

## 2016-02-28 DIAGNOSIS — I739 Peripheral vascular disease, unspecified: Secondary | ICD-10-CM | POA: Diagnosis not present

## 2016-02-28 DIAGNOSIS — I509 Heart failure, unspecified: Secondary | ICD-10-CM | POA: Diagnosis not present

## 2016-02-28 DIAGNOSIS — Z9862 Peripheral vascular angioplasty status: Secondary | ICD-10-CM | POA: Insufficient documentation

## 2016-02-28 DIAGNOSIS — I70201 Unspecified atherosclerosis of native arteries of extremities, right leg: Secondary | ICD-10-CM | POA: Diagnosis not present

## 2016-02-28 LAB — VAS US LOWER EXTREMITY ARTERIAL DUPLEX: Right super femoral prox sys PSV: -61 cm/s

## 2016-02-28 MED ORDER — TRAMADOL HCL 50 MG PO TABS: 50.0000 mg | ORAL_TABLET | Freq: Two times a day (BID) | ORAL | Status: DC | PRN

## 2016-02-28 NOTE — Telephone Encounter (Signed)
Previous message not sent in error-will forward info to pcp for review, please advise.Wendy Spittle Cassady7/13/20174:30 PM

## 2016-02-28 NOTE — Telephone Encounter (Signed)
Refilled tramadol 50mg  #20.  She has appointment with me in 1 week.

## 2016-03-03 ENCOUNTER — Ambulatory Visit (INDEPENDENT_AMBULATORY_CARE_PROVIDER_SITE_OTHER): Payer: Medicare Other | Admitting: Family

## 2016-03-03 ENCOUNTER — Encounter: Payer: Self-pay | Admitting: Family

## 2016-03-03 VITALS — BP 129/76 | HR 102 | Temp 98.4°F | Resp 20 | Ht 67.0 in | Wt 140.0 lb

## 2016-03-03 DIAGNOSIS — Z9862 Peripheral vascular angioplasty status: Secondary | ICD-10-CM

## 2016-03-03 DIAGNOSIS — E0500 Thyrotoxicosis with diffuse goiter without thyrotoxic crisis or storm: Secondary | ICD-10-CM | POA: Diagnosis not present

## 2016-03-03 DIAGNOSIS — I70235 Atherosclerosis of native arteries of right leg with ulceration of other part of foot: Secondary | ICD-10-CM

## 2016-03-03 DIAGNOSIS — M86171 Other acute osteomyelitis, right ankle and foot: Secondary | ICD-10-CM | POA: Diagnosis not present

## 2016-03-03 DIAGNOSIS — I872 Venous insufficiency (chronic) (peripheral): Secondary | ICD-10-CM

## 2016-03-03 DIAGNOSIS — Z79899 Other long term (current) drug therapy: Secondary | ICD-10-CM | POA: Diagnosis not present

## 2016-03-03 NOTE — Patient Instructions (Signed)
Peripheral Vascular Disease Peripheral vascular disease (PVD) is a disease of the blood vessels that are not part of your heart and brain. A simple term for PVD is poor circulation. In most cases, PVD narrows the blood vessels that carry blood from your heart to the rest of your body. This can result in a decreased supply of blood to your arms, legs, and internal organs, like your stomach or kidneys. However, it most often affects a person's lower legs and feet. There are two types of PVD.  Organic PVD. This is the more common type. It is caused by damage to the structure of blood vessels.  Functional PVD. This is caused by conditions that make blood vessels contract and tighten (spasm). Without treatment, PVD tends to get worse over time. PVD can also lead to acute ischemic limb. This is when an arm or limb suddenly has trouble getting enough blood. This is a medical emergency. CAUSES Each type of PVD has many different causes. The most common cause of PVD is buildup of a fatty material (plaque) inside of your arteries (atherosclerosis). Small amounts of plaque can break off from the walls of the blood vessels and become lodged in a smaller artery. This blocks blood flow and can cause acute ischemic limb. Other common causes of PVD include:  Blood clots that form inside of blood vessels.  Injuries to blood vessels.  Diseases that cause inflammation of blood vessels or cause blood vessel spasms.  Health behaviors and health history that increase your risk of developing PVD. RISK FACTORS  You may have a greater risk of PVD if you:  Have a family history of PVD.  Have certain medical conditions, including:  High cholesterol.  Diabetes.  High blood pressure (hypertension).  Coronary heart disease.  Past problems with blood clots.  Past injury, such as burns or a broken bone. These may have damaged blood vessels in your limbs.  Buerger disease. This is caused by inflamed blood  vessels in your hands and feet.  Some forms of arthritis.  Rare birth defects that affect the arteries in your legs.  Use tobacco.  Do not get enough exercise.  Are obese.  Are age 50 or older. SIGNS AND SYMPTOMS  PVD may cause many different symptoms. Your symptoms depend on what part of your body is not getting enough blood. Some common signs and symptoms include:  Cramps in your lower legs. This may be a symptom of poor leg circulation (claudication).  Pain and weakness in your legs while you are physically active that goes away when you rest (intermittent claudication).  Leg pain when at rest.  Leg numbness, tingling, or weakness.  Coldness in a leg or foot, especially when compared with the other leg.  Skin or hair changes. These can include:  Hair loss.  Shiny skin.  Pale or bluish skin.  Thick toenails.  Inability to get or maintain an erection (erectile dysfunction). People with PVD are more prone to developing ulcers and sores on their toes, feet, or legs. These may take longer than normal to heal. DIAGNOSIS Your health care provider may diagnose PVD from your signs and symptoms. The health care provider will also do a physical exam. You may have tests to find out what is causing your PVD and determine its severity. Tests may include:  Blood pressure recordings from your arms and legs and measurements of the strength of your pulses (pulse volume recordings).  Imaging studies using sound waves to take pictures of   the blood flow through your blood vessels (Doppler ultrasound).  Injecting a dye into your blood vessels before having imaging studies using:  X-rays (angiogram or arteriogram).  Computer-generated X-rays (CT angiogram).  A powerful electromagnetic field and a computer (magnetic resonance angiogram or MRA). TREATMENT Treatment for PVD depends on the cause of your condition and the severity of your symptoms. It also depends on your age. Underlying  causes need to be treated and controlled. These include long-lasting (chronic) conditions, such as diabetes, high cholesterol, and high blood pressure. You may need to first try making lifestyle changes and taking medicines. Surgery may be needed if these do not work. Lifestyle changes may include:  Quitting smoking.  Exercising regularly.  Following a low-fat, low-cholesterol diet. Medicines may include:  Blood thinners to prevent blood clots.  Medicines to improve blood flow.  Medicines to improve your blood cholesterol levels. Surgical procedures may include:  A procedure that uses an inflated balloon to open a blocked artery and improve blood flow (angioplasty).  A procedure to put in a tube (stent) to keep a blocked artery open (stent implant).  Surgery to reroute blood flow around a blocked artery (peripheral bypass surgery).  Surgery to remove dead tissue from an infected wound on the affected limb.  Amputation. This is surgical removal of the affected limb. This may be necessary in cases of acute ischemic limb that are not improved through medical or surgical treatments. HOME CARE INSTRUCTIONS  Take medicines only as directed by your health care provider.  Do not use any tobacco products, including cigarettes, chewing tobacco, or electronic cigarettes. If you need help quitting, ask your health care provider.  Lose weight if you are overweight, and maintain a healthy weight as directed by your health care provider.  Eat a diet that is low in fat and cholesterol. If you need help, ask your health care provider.  Exercise regularly. Ask your health care provider to suggest some good activities for you.  Use compression stockings or other mechanical devices as directed by your health care provider.  Take good care of your feet.  Wear comfortable shoes that fit well.  Check your feet often for any cuts or sores. SEEK MEDICAL CARE IF:  You have cramps in your legs  while walking.  You have leg pain when you are at rest.  You have coldness in a leg or foot.  Your skin changes.  You have erectile dysfunction.  You have cuts or sores on your feet that are not healing. SEEK IMMEDIATE MEDICAL CARE IF:  Your arm or leg turns cold and blue.  Your arms or legs become red, warm, swollen, painful, or numb.  You have chest pain or trouble breathing.  You suddenly have weakness in your face, arm, or leg.  You become very confused or lose the ability to speak.  You suddenly have a very bad headache or lose your vision.   This information is not intended to replace advice given to you by your health care provider. Make sure you discuss any questions you have with your health care provider.   Document Released: 09/11/2004 Document Revised: 08/25/2014 Document Reviewed: 01/12/2014 Elsevier Interactive Patient Education 2016 [employer]  

## 2016-03-03 NOTE — Progress Notes (Signed)
Postoperative Visit   History of Present Illness  [name] is a 82 y.o. female patient who is s/p abdominal aortogram with bilateral LE run off, angioplasty of the right posterior tibial artery on 01/30/16 by Dr. Myra Gianotti for right leg ulcer. She returns today for 4 weeks follow up. [facility] is seeing pt for wound care and she is also being treated at the wound care center.  Son states the 2 ulcers on her right foot are healing. Son also states that 3 family members live with pt and help care for her.  Review of records: [facility], Resident Clinic note from 02/18/16: Patient had presumed osteomyelitis of the 1st metatarsal head along with the MSSA bacteremia for which she is getting IV antibiotics through July 23rd. She is here for a follow up visit from last week, and has an appt with wound care next week. Patient denies any fevers, chills, any purulent drainage from the site. She is not able to ambulate on the feet. Was accompanied by her son. On exam, the wound seems to be healing. No drainage or erythema appreciated.  She was given Tylenol #3 21 tablets last week for the pain. Patient says it is not working and currently describes the pain as 10/10 mainly in her right foot. --re-dressed the wound by Ms Purnell Shoemaker -encouraged to follow up with wound care -given tramadol 20 tablets with no refills -Follow up with PCP CBC was checked at the last visit- white count normal. Patiet is currently taking cefazolin q8 hours via PICC line, end date 03/09/2016    For VQI Use Only  PRE-ADM LIVING: Home  AMB STATUS: Wheelchair  Physical Examination  Filed Vitals:   03/03/16 1242  BP: 129/76  Pulse: 102  Temp: 98.4 F (36.9 C)  TempSrc: Oral  Resp: 20  Height: 5\' 7"  (1.702 m)  Weight: 140 lb (63.504 kg)  SpO2: 96%   Body mass index is 21.92 kg/(m^2).  PHYSICAL EXAMINATION: General: The patient appears their stated age, thin elderly female.   HEENT:   No gross abnormalities Pulmonary: Respirations are non-labored Abdomen: Soft and non-tender. Musculoskeletal: There are no major deformities, see Skin.   Neurologic: No focal weakness or paresthesias are detected, is hard of hearing. Skin: There are no rashes. Both ulcers on right foot with eschar, well defined and contracting borders, no erythema, minimal drainage. Psychiatric: The patient has normal affect. Cardiovascular: There is an irregular rhythm and controlled rate .   Vascular: Vessel Right Left  Radial Palpable Palpable  Aorta Not palpable N/A  Femoral 2+Palpable 2+Palpable  Popliteal Not palpable Not palpable  PT Brisk Doppler signal, not Palpable Brisk Doppler signal, not Palpable  DP Faint Doppler signal, not Palpable Brisk Doppler signal, not Palpable     LOWER EXTREMITY ARTERIAL DUPLEX EVALUATION (02/28/16)    INDICATION: Peripheral arterial disease    PREVIOUS INTERVENTION(S): Right posterior tibial artery balloon angioplasty 01-30-16.    DUPLEX EXAM:     RIGHT  LEFT   Peak Systolic Velocity (cm/s) Ratio (if abnormal) Waveform  Peak Systolic Velocity (cm/s) Ratio (if abnormal) Waveform  72  B Common Femoral Artery     52  B Deep Femoral Artery     61  T Superficial Femoral Artery Proximal     62  T Superficial Femoral Artery Mid     101>199  M Superficial Femoral Artery Distal     102  B Popliteal Artery     61  M Posterior Tibial Artery Dist     17  M Anterior Tibial Artery Distal     15  M Peroneal Artery Distal      Today's ABI / TBI    Previous ABI / TBI (  )     Waveform:    M - Monophasic       B - Biphasic       T - Triphasic  If Ankle Brachial Index (ABI) or Toe Brachial Index (TBI) performed, please see complete report     ADDITIONAL FINDINGS:     IMPRESSION: 1. Diffuse plaque throughout the right lower extremity arteries. 2. 30-49% stenosis in the distal femoral artery.    Compared to the previous exam:  No previous study at this location  for comparison.       Medical Decision Making  Shatana Saxton is a 82 y.o. female who presents s/p abdominal aortogram with bilateral LE run off, angioplasty of the right posterior tibial artery on 01/30/16 by Dr. Myra Gianotti for right leg ulcer. Right PT pulse with brisk Doppler signal. Both ulcers on right foot are improving and contracting, no erythema. Antibiotics continue via right arm PICC line to 03/09/16, managed by [facility] management through [facility].   02/28/16 right LE arterial duplex suggests diffuse plaque throughout the right lower extremity arteries, 30-49% stenosis in the distal femoral artery.  01/30/16 arteriogram after angioplasty of of the right posterior tibial artery:             #1 successful recanalization of an occluded posterior tibial artery and subsequent balloon angioplasty using a 3 mm balloon #2 residual stenosis about onto the foot and at the level of the ankle. This was unable to be crossed. #3 single vessel runoff on the right leg via a very diseased anterior tibial artery Based on current physical exam findings and HPI, and after discussing with Dr. Myra Gianotti, the patient will follow up in 6 weeks with me on a day that Dr. Myra Gianotti is in the office for right foot wound evaluation. Continue wound care center treatment and [facility]. The patient is currently on a statin. The patient is currently on an anti-platelet: ASA.  She also take Xarelto.  Thank you for allowing [place] to participate in this patient's care.  NICKEL, Carma Lair, RN, MSN, FNP- [facility] Office: [phone]  03/03/2016, 1:00 PM  Clinic MD: Myra Gianotti

## 2016-03-04 ENCOUNTER — Other Ambulatory Visit: Payer: Self-pay | Admitting: Internal Medicine

## 2016-03-04 ENCOUNTER — Ambulatory Visit: Payer: Medicare Other | Admitting: Family

## 2016-03-04 DIAGNOSIS — E0500 Thyrotoxicosis with diffuse goiter without thyrotoxic crisis or storm: Secondary | ICD-10-CM | POA: Diagnosis not present

## 2016-03-04 DIAGNOSIS — M86171 Other acute osteomyelitis, right ankle and foot: Secondary | ICD-10-CM | POA: Diagnosis not present

## 2016-03-05 DIAGNOSIS — I5022 Chronic systolic (congestive) heart failure: Secondary | ICD-10-CM | POA: Diagnosis not present

## 2016-03-05 DIAGNOSIS — E0500 Thyrotoxicosis with diffuse goiter without thyrotoxic crisis or storm: Secondary | ICD-10-CM | POA: Diagnosis not present

## 2016-03-05 DIAGNOSIS — E1169 Type 2 diabetes mellitus with other specified complication: Secondary | ICD-10-CM | POA: Diagnosis not present

## 2016-03-05 DIAGNOSIS — M301 Polyarteritis with lung involvement [Churg-Strauss]: Secondary | ICD-10-CM | POA: Diagnosis not present

## 2016-03-05 DIAGNOSIS — E1152 Type 2 diabetes mellitus with diabetic peripheral angiopathy with gangrene: Secondary | ICD-10-CM | POA: Diagnosis not present

## 2016-03-05 DIAGNOSIS — H409 Unspecified glaucoma: Secondary | ICD-10-CM | POA: Diagnosis not present

## 2016-03-05 DIAGNOSIS — M109 Gout, unspecified: Secondary | ICD-10-CM | POA: Diagnosis not present

## 2016-03-05 DIAGNOSIS — M86171 Other acute osteomyelitis, right ankle and foot: Secondary | ICD-10-CM | POA: Diagnosis not present

## 2016-03-05 DIAGNOSIS — I110 Hypertensive heart disease with heart failure: Secondary | ICD-10-CM | POA: Diagnosis not present

## 2016-03-05 DIAGNOSIS — E11621 Type 2 diabetes mellitus with foot ulcer: Secondary | ICD-10-CM | POA: Diagnosis not present

## 2016-03-05 DIAGNOSIS — I70235 Atherosclerosis of native arteries of right leg with ulceration of other part of foot: Secondary | ICD-10-CM | POA: Diagnosis not present

## 2016-03-05 DIAGNOSIS — I080 Rheumatic disorders of both mitral and aortic valves: Secondary | ICD-10-CM | POA: Diagnosis not present

## 2016-03-05 DIAGNOSIS — L97514 Non-pressure chronic ulcer of other part of right foot with necrosis of bone: Secondary | ICD-10-CM | POA: Diagnosis not present

## 2016-03-06 ENCOUNTER — Encounter: Payer: Medicare Other | Admitting: Internal Medicine

## 2016-03-06 ENCOUNTER — Encounter: Payer: Self-pay | Admitting: Internal Medicine

## 2016-03-10 DIAGNOSIS — E0500 Thyrotoxicosis with diffuse goiter without thyrotoxic crisis or storm: Secondary | ICD-10-CM | POA: Diagnosis not present

## 2016-03-10 DIAGNOSIS — M86171 Other acute osteomyelitis, right ankle and foot: Secondary | ICD-10-CM | POA: Diagnosis not present

## 2016-03-10 DIAGNOSIS — Z5181 Encounter for therapeutic drug level monitoring: Secondary | ICD-10-CM | POA: Diagnosis not present

## 2016-03-12 DIAGNOSIS — M109 Gout, unspecified: Secondary | ICD-10-CM | POA: Diagnosis not present

## 2016-03-12 DIAGNOSIS — I70235 Atherosclerosis of native arteries of right leg with ulceration of other part of foot: Secondary | ICD-10-CM | POA: Diagnosis not present

## 2016-03-12 DIAGNOSIS — I110 Hypertensive heart disease with heart failure: Secondary | ICD-10-CM | POA: Diagnosis not present

## 2016-03-12 DIAGNOSIS — H409 Unspecified glaucoma: Secondary | ICD-10-CM | POA: Diagnosis not present

## 2016-03-12 DIAGNOSIS — E1169 Type 2 diabetes mellitus with other specified complication: Secondary | ICD-10-CM | POA: Diagnosis not present

## 2016-03-12 DIAGNOSIS — M301 Polyarteritis with lung involvement [Churg-Strauss]: Secondary | ICD-10-CM | POA: Diagnosis not present

## 2016-03-12 DIAGNOSIS — L97514 Non-pressure chronic ulcer of other part of right foot with necrosis of bone: Secondary | ICD-10-CM | POA: Diagnosis not present

## 2016-03-12 DIAGNOSIS — E11621 Type 2 diabetes mellitus with foot ulcer: Secondary | ICD-10-CM | POA: Diagnosis not present

## 2016-03-12 DIAGNOSIS — I5022 Chronic systolic (congestive) heart failure: Secondary | ICD-10-CM | POA: Diagnosis not present

## 2016-03-12 DIAGNOSIS — M86171 Other acute osteomyelitis, right ankle and foot: Secondary | ICD-10-CM | POA: Diagnosis not present

## 2016-03-12 DIAGNOSIS — I080 Rheumatic disorders of both mitral and aortic valves: Secondary | ICD-10-CM | POA: Diagnosis not present

## 2016-03-12 DIAGNOSIS — E1152 Type 2 diabetes mellitus with diabetic peripheral angiopathy with gangrene: Secondary | ICD-10-CM | POA: Diagnosis not present

## 2016-03-17 ENCOUNTER — Telehealth: Payer: Self-pay | Admitting: *Deleted

## 2016-03-17 ENCOUNTER — Telehealth: Payer: Self-pay

## 2016-03-17 DIAGNOSIS — Z5181 Encounter for therapeutic drug level monitoring: Secondary | ICD-10-CM | POA: Diagnosis not present

## 2016-03-17 DIAGNOSIS — E0500 Thyrotoxicosis with diffuse goiter without thyrotoxic crisis or storm: Secondary | ICD-10-CM | POA: Diagnosis not present

## 2016-03-17 DIAGNOSIS — M86171 Other acute osteomyelitis, right ankle and foot: Secondary | ICD-10-CM | POA: Diagnosis not present

## 2016-03-17 NOTE — Telephone Encounter (Signed)
Pt daughter requesting to speak with a nurse regarding personal problem. Please call daughter back after 4:00.

## 2016-03-17 NOTE — Telephone Encounter (Signed)
appt scheduled for 8/1 for PICC and C.Diff

## 2016-03-17 NOTE — Telephone Encounter (Signed)
appt scheduled for 8/1

## 2016-03-17 NOTE — Telephone Encounter (Signed)
Gala Romney, [facility] calls and states pt's abx are finished and should PICC be pulled? Informed him that since she missed her last appt, may need recheck, he also states pt has started having watery, foul smelling diarrhea multiple times daily. Do we need to give her an appt this week? Please advise, sending to attending and dr Mikey Bussing

## 2016-03-17 NOTE — Telephone Encounter (Signed)
Patient completed 6 weeks of cefazolin via PICC for MSSA bacteremia and early osteomyelitis due to diabetic ulcer on an extremity. After reviewing the chart, I think it is fine to remove the PICC line. Patient should come in as able to leave a sample for C. difficile testing given her very frequent watery diarrhea.

## 2016-03-18 ENCOUNTER — Encounter: Payer: Self-pay | Admitting: Internal Medicine

## 2016-03-18 ENCOUNTER — Ambulatory Visit (INDEPENDENT_AMBULATORY_CARE_PROVIDER_SITE_OTHER): Payer: Medicare Other | Admitting: Internal Medicine

## 2016-03-18 ENCOUNTER — Ambulatory Visit: Payer: Medicare Other

## 2016-03-18 VITALS — BP 141/79 | HR 55 | Temp 98.9°F

## 2016-03-18 DIAGNOSIS — I739 Peripheral vascular disease, unspecified: Secondary | ICD-10-CM

## 2016-03-18 DIAGNOSIS — Z95828 Presence of other vascular implants and grafts: Secondary | ICD-10-CM

## 2016-03-18 DIAGNOSIS — A4901 Methicillin susceptible Staphylococcus aureus infection, unspecified site: Secondary | ICD-10-CM

## 2016-03-18 DIAGNOSIS — E11621 Type 2 diabetes mellitus with foot ulcer: Secondary | ICD-10-CM

## 2016-03-18 DIAGNOSIS — E1151 Type 2 diabetes mellitus with diabetic peripheral angiopathy without gangrene: Secondary | ICD-10-CM

## 2016-03-18 DIAGNOSIS — B9561 Methicillin susceptible Staphylococcus aureus infection as the cause of diseases classified elsewhere: Secondary | ICD-10-CM

## 2016-03-18 DIAGNOSIS — E1169 Type 2 diabetes mellitus with other specified complication: Secondary | ICD-10-CM | POA: Diagnosis not present

## 2016-03-18 DIAGNOSIS — L97419 Non-pressure chronic ulcer of right heel and midfoot with unspecified severity: Secondary | ICD-10-CM

## 2016-03-18 DIAGNOSIS — Z7984 Long term (current) use of oral hypoglycemic drugs: Secondary | ICD-10-CM

## 2016-03-18 DIAGNOSIS — Z7982 Long term (current) use of aspirin: Secondary | ICD-10-CM

## 2016-03-18 DIAGNOSIS — M86171 Other acute osteomyelitis, right ankle and foot: Secondary | ICD-10-CM

## 2016-03-18 DIAGNOSIS — Z7901 Long term (current) use of anticoagulants: Secondary | ICD-10-CM

## 2016-03-18 MED ORDER — TRAMADOL HCL 50 MG PO TABS: 50.0000 mg | ORAL_TABLET | Freq: Four times a day (QID) | ORAL | 0 refills | Status: DC | PRN

## 2016-03-18 NOTE — Assessment & Plan Note (Signed)
Patient recently underwent angioplasty of right posterior tibial artery for her right leg ulcer in June 2017. Patient was seen by Vascular Surgery on 7/17 and reported that both ulcers on right foot were improving and contracting, without erythema. Right LE arterial duplex at that visit suggested diffuse plaque throughout the right lower extremity arteries, 30-49% stenosis in the distal femoral artery. Recommendations were to continue wound care, statin, ASA, Xarelto.  Plan: -Continue ASA, Xarelto, Atorvastatin 40 mg daily -Follow up with wound care on 8/2 -Follow up with vascular surgery on 8/23

## 2016-03-18 NOTE — Telephone Encounter (Signed)
Agree, thank you Dr Oswaldo Done and Myriam Jacobson

## 2016-03-18 NOTE — Assessment & Plan Note (Addendum)
Patient was recently hospitalized in June 2017 for MSSA bacteremia 2/2 infected diabetic wound ulcer on her right foot. During hospitalization, patient underwent an MRI of her right foot which showed early osteomyelitis of the dorsal medial head of the first metatarsal. She was evaluated by vascular surgery and underwent angioplasty of right posterior tibial artery for her right leg ulcer. Patient was treated with IV Cefazolin through 7/23 through PICC. At follow up visits in June and July ulcers were apparently healing and patient was being seen by wound care once weekly. Physical exam reveals right lower extremity wound 3 x 3 cm over dorsal medial head of the first metatarsal with erythematous tissue and surrounding eschar with minimal serosanguinous drainage. Posterior heel wound 5 x 5 cm with large eschar and mildly odorous serosanginous drainage with pink healthy tissue beneath wound. ? Early purulent material. Patient is afebrile.   Assessment: Healing first metatarsal wound. Concern for early infection of right heel wound.   Plan:  -Will contact wound care center to establish if they will debride wound at the center or if she needs admission for surgical debridement.  -Will send message to infection disease for further recommendation -Patient can likely be debrided as outpatient will close monitoring of wounds -Will see back in clinic on 8/4 for recheck -If evidence of infection, may need admission for repeat imaging to evaluate for osteomyelitis and consideration of amputation if failed IV antibiotics -Repeat CBC on 03/21/16 -Continue PICC for now to determine if patient will need admission for antibiotics -If wound care and ID feel her wounds are not infected, will remove PICC line on 8/4 -Tramadol 50 mg QID prn pain

## 2016-03-18 NOTE — Progress Notes (Signed)
CC: foot pain  HPI:  [name] is a 82 y.o. with PMHx of HTN, combined CHF, chronic atrial fibrillation, pulmonary HTN, and PAD who presents for follow up for right foot osteomyelitis.  Patient was recently hospitalized in June 2017 for MSSA bacteremia 2/2 infected diabetic wound ulcer on her right foot. During hospitalization, patient underwent an MRI of her right foot which showed early osteomyelitis of the dorsal medial head of the first metatarsal. She was evaluated by vascular surgery and underwent angioplasty of right posterior tibial artery for her right leg ulcer. Patient was treated with IV Cefazolin through 7/23 through PICC. At follow up visits in June and July ulcers were apparently healing and patient was being seen by wound care once weekly.   On follow up today, patient complains of severe right lower extremity pain and drainage. She denies any fever, chills, nausea, or vomiting. Patient was having loose stools, which apparently has resolved. Patient denies fever or abdominal pain. She has not had a bowel movement today.  Past Medical History:  Diagnosis Date  . Asthma   . Atrial fibrillation, permanent (HCC)    a. Dx 04/2013, on metoprolol, dig and xarelto  . Cataract    left eye  . CHF (congestive heart failure) (HCC)   . Chronic combined systolic and diastolic heart failure (HCC)    a. 02/2012 EF:20-25%  . Churg-Strauss syndrome (HCC)    a. Sural nerve biopsy 02/2000.  . Ectopic atrial tachycardia (HCC)    a. Documented on tele as IP 02/2012.  . Glaucoma   . Grave's disease    a. s/p thyroidectomy.  . Hypercholesteremia   . Hypertension   . Hypokalemia    a. Previously felt due to diuretics, req supplementation.  . Lipoma    left inner thigh  . Multifocal atrial tachycardia (HCC)    a. Documented as OP 01/2012.  Marland Kitchen NSVT (nonsustained ventricular tachycardia) (HCC)    a. During CHF adm 2001.  . On home oxygen therapy    "wear it mostly at night" (09/20/2014)    . Osteoarthritis    left shoulder  . Osteopenia    DEXA 2004  . Pericardial effusion    a. Echo 02/2012: small-mod pericardial effusion.  . Pleural effusion, left    a. Thoracentesis 2001 - per notes, no malignant cells, was transudative.  . Pneumonia 1930's  . Pseudogout   . Pulmonary HTN (HCC)    a. Mod by echo 02/2012.  Marland Kitchen Shortness of breath   . Type II diabetes mellitus (HCC)   . Valvular heart disease    a. Echo 02/2012: mod MR/TR, mild AI.  Marland Kitchen VASCULITIS   . Venous insufficiency     Review of Systems:   A complete ROS was negative except as per HPI.   Physical Exam: Vitals:   03/18/16 1704  BP: (!) 141/79  Pulse: (!) 55  Temp: 98.9 F (37.2 C)  TempSrc: Oral  SpO2: 94%   General: Vital signs reviewed.  Patient is chronically ill appearing, elderly, frail, in no acute distress and cooperative with exam.  Cardiovascular: Bradycardic, regular rhythm Pulmonary/Chest: Clear to auscultation bilaterally, no wheezes, rales, or rhonchi. Abdominal: Soft, non-tender, non-distended, BS + Extremities: No lower extremity edema bilaterally, unable to palpated PT or DP pulses (consistent with vascular surgery exam). Right and left extremities are warm. Right lower extremity wound 3 x 3 cm over dorsal medial head of the first metatarsal with erythematous tissue and surrounding eschar with minimal  serosanguinous drainage. Posterior heel wound 5 x 5 cm with large eschar and mildly odorous serosanginous drainage with pink healthy tissue beneath wound. ? Early purulent material Neurological: Awake, alert, answering questions appropriately        Assessment & Plan:   See Encounters Tab for problem based charting.   Patient seen with Dr. Heide Spark

## 2016-03-18 NOTE — Assessment & Plan Note (Signed)
Patient was recently hospitalized in June 2017 for MSSA bacteremia 2/2 infected diabetic wound ulcer on her right foot. Patient was treated with IV Cefazolin through 7/23 through PICC.   Assessment: completed IV antibiotics for MSSA  Plan: -Remove PICC on 8/4 if no concern for reinfected wounds -Patient to follow up with ID tomorrow

## 2016-03-18 NOTE — Patient Instructions (Addendum)
CONTINUE TAKING ALL MEDICATIONS AS PRESCRIBED.  FOLLOW UP WITH DR. Drue Second WITH INFECTIOUS DISEASE TOMORROW AT 11:15 AM.  FOLLOW UP WITH THE WOUND CARE CENTER CENTER TOMORROW AT 12:30 PM.   WE WILL BE TALKING WITH THE WOUND CARE CENTER AND DR. SNIDER ABOUT THE PLAN REGARDING YOUR CARE AND THE PICC LINE.  YOU MAY TAKE TRAMADOL 4 TIMES A DAY AS NEEDED FOR SEVERE PAIN.   FOLLOW UP WITH [place] LATER THIS WEEK FOR A FOLLOW UP APPOINTMENT.

## 2016-03-19 ENCOUNTER — Encounter (HOSPITAL_COMMUNITY): Payer: Self-pay

## 2016-03-19 ENCOUNTER — Inpatient Hospital Stay (HOSPITAL_COMMUNITY)
Admission: AD | Admit: 2016-03-19 | Discharge: 2016-03-24 | DRG: 616 | Disposition: A | Discharge status: Home Health | Payer: Medicare Other | Source: Ambulatory Visit | Attending: Internal Medicine | Admitting: Internal Medicine

## 2016-03-19 ENCOUNTER — Encounter (HOSPITAL_BASED_OUTPATIENT_CLINIC_OR_DEPARTMENT_OTHER): Payer: Medicare Other | Attending: Surgery

## 2016-03-19 ENCOUNTER — Inpatient Hospital Stay (HOSPITAL_COMMUNITY): Payer: Medicare Other

## 2016-03-19 ENCOUNTER — Ambulatory Visit (INDEPENDENT_AMBULATORY_CARE_PROVIDER_SITE_OTHER): Payer: Medicare Other | Admitting: Internal Medicine

## 2016-03-19 DIAGNOSIS — L89899 Pressure ulcer of other site, unspecified stage: Secondary | ICD-10-CM

## 2016-03-19 DIAGNOSIS — I272 Other secondary pulmonary hypertension: Secondary | ICD-10-CM | POA: Diagnosis not present

## 2016-03-19 DIAGNOSIS — Z6821 Body mass index (BMI) 21.0-21.9, adult: Secondary | ICD-10-CM

## 2016-03-19 DIAGNOSIS — I5042 Chronic combined systolic (congestive) and diastolic (congestive) heart failure: Secondary | ICD-10-CM | POA: Diagnosis present

## 2016-03-19 DIAGNOSIS — M869 Osteomyelitis, unspecified: Secondary | ICD-10-CM | POA: Diagnosis not present

## 2016-03-19 DIAGNOSIS — L97519 Non-pressure chronic ulcer of other part of right foot with unspecified severity: Secondary | ICD-10-CM | POA: Diagnosis not present

## 2016-03-19 DIAGNOSIS — Z66 Do not resuscitate: Secondary | ICD-10-CM | POA: Diagnosis not present

## 2016-03-19 DIAGNOSIS — Z9861 Coronary angioplasty status: Secondary | ICD-10-CM | POA: Diagnosis not present

## 2016-03-19 DIAGNOSIS — I739 Peripheral vascular disease, unspecified: Secondary | ICD-10-CM

## 2016-03-19 DIAGNOSIS — E1169 Type 2 diabetes mellitus with other specified complication: Principal | ICD-10-CM | POA: Diagnosis present

## 2016-03-19 DIAGNOSIS — Z888 Allergy status to other drugs, medicaments and biological substances status: Secondary | ICD-10-CM

## 2016-03-19 DIAGNOSIS — M86271 Subacute osteomyelitis, right ankle and foot: Secondary | ICD-10-CM | POA: Diagnosis not present

## 2016-03-19 DIAGNOSIS — Z7982 Long term (current) use of aspirin: Secondary | ICD-10-CM

## 2016-03-19 DIAGNOSIS — I4891 Unspecified atrial fibrillation: Secondary | ICD-10-CM | POA: Diagnosis not present

## 2016-03-19 DIAGNOSIS — I110 Hypertensive heart disease with heart failure: Secondary | ICD-10-CM | POA: Diagnosis not present

## 2016-03-19 DIAGNOSIS — B9689 Other specified bacterial agents as the cause of diseases classified elsewhere: Secondary | ICD-10-CM | POA: Diagnosis not present

## 2016-03-19 DIAGNOSIS — Z833 Family history of diabetes mellitus: Secondary | ICD-10-CM | POA: Diagnosis not present

## 2016-03-19 DIAGNOSIS — M86171 Other acute osteomyelitis, right ankle and foot: Secondary | ICD-10-CM | POA: Diagnosis not present

## 2016-03-19 DIAGNOSIS — I10 Essential (primary) hypertension: Secondary | ICD-10-CM | POA: Diagnosis present

## 2016-03-19 DIAGNOSIS — E43 Unspecified severe protein-calorie malnutrition: Secondary | ICD-10-CM | POA: Diagnosis not present

## 2016-03-19 DIAGNOSIS — M301 Polyarteritis with lung involvement [Churg-Strauss]: Secondary | ICD-10-CM | POA: Diagnosis present

## 2016-03-19 DIAGNOSIS — I482 Chronic atrial fibrillation, unspecified: Secondary | ICD-10-CM | POA: Diagnosis present

## 2016-03-19 DIAGNOSIS — L97419 Non-pressure chronic ulcer of right heel and midfoot with unspecified severity: Secondary | ICD-10-CM | POA: Diagnosis present

## 2016-03-19 DIAGNOSIS — E1142 Type 2 diabetes mellitus with diabetic polyneuropathy: Secondary | ICD-10-CM | POA: Diagnosis not present

## 2016-03-19 DIAGNOSIS — Z7901 Long term (current) use of anticoagulants: Secondary | ICD-10-CM | POA: Diagnosis not present

## 2016-03-19 DIAGNOSIS — E1152 Type 2 diabetes mellitus with diabetic peripheral angiopathy with gangrene: Secondary | ICD-10-CM | POA: Diagnosis not present

## 2016-03-19 DIAGNOSIS — E11621 Type 2 diabetes mellitus with foot ulcer: Secondary | ICD-10-CM | POA: Diagnosis present

## 2016-03-19 DIAGNOSIS — Z7984 Long term (current) use of oral hypoglycemic drugs: Secondary | ICD-10-CM | POA: Diagnosis not present

## 2016-03-19 DIAGNOSIS — M79671 Pain in right foot: Secondary | ICD-10-CM | POA: Diagnosis present

## 2016-03-19 DIAGNOSIS — I70261 Atherosclerosis of native arteries of extremities with gangrene, right leg: Secondary | ICD-10-CM | POA: Diagnosis not present

## 2016-03-19 DIAGNOSIS — L97514 Non-pressure chronic ulcer of other part of right foot with necrosis of bone: Secondary | ICD-10-CM | POA: Diagnosis not present

## 2016-03-19 DIAGNOSIS — I509 Heart failure, unspecified: Secondary | ICD-10-CM | POA: Diagnosis not present

## 2016-03-19 DIAGNOSIS — Z79899 Other long term (current) drug therapy: Secondary | ICD-10-CM | POA: Diagnosis not present

## 2016-03-19 DIAGNOSIS — Z9981 Dependence on supplemental oxygen: Secondary | ICD-10-CM | POA: Diagnosis not present

## 2016-03-19 DIAGNOSIS — E0500 Thyrotoxicosis with diffuse goiter without thyrotoxic crisis or storm: Secondary | ICD-10-CM | POA: Diagnosis not present

## 2016-03-19 DIAGNOSIS — I96 Gangrene, not elsewhere classified: Secondary | ICD-10-CM | POA: Diagnosis not present

## 2016-03-19 DIAGNOSIS — I70234 Atherosclerosis of native arteries of right leg with ulceration of heel and midfoot: Secondary | ICD-10-CM | POA: Diagnosis not present

## 2016-03-19 DIAGNOSIS — H409 Unspecified glaucoma: Secondary | ICD-10-CM | POA: Diagnosis present

## 2016-03-19 LAB — CBC WITH DIFFERENTIAL/PLATELET
BASOS ABS: 0 10*3/uL (ref 0.0–0.1)
BASOS PCT: 1 %
EOS ABS: 0.1 10*3/uL (ref 0.0–0.7)
Eosinophils Relative: 3 %
HCT: 33 % — ABNORMAL LOW (ref 36.0–46.0)
Hemoglobin: 10.1 g/dL — ABNORMAL LOW (ref 12.0–15.0)
Lymphocytes Relative: 25 %
Lymphs Abs: 1.1 10*3/uL (ref 0.7–4.0)
MCH: 29.3 pg (ref 26.0–34.0)
MCHC: 30.6 g/dL (ref 30.0–36.0)
MCV: 95.7 fL (ref 78.0–100.0)
Monocytes Absolute: 0.7 10*3/uL (ref 0.1–1.0)
Monocytes Relative: 15 %
NEUTROS PCT: 56 %
Neutro Abs: 2.5 10*3/uL (ref 1.7–7.7)
Platelets: 249 10*3/uL (ref 150–400)
RBC: 3.45 MIL/uL — AB (ref 3.87–5.11)
RDW: 14.8 % (ref 11.5–15.5)
WBC: 4.4 10*3/uL (ref 4.0–10.5)

## 2016-03-19 LAB — COMPREHENSIVE METABOLIC PANEL
ALBUMIN: 2.9 g/dL — AB (ref 3.5–5.0)
ALK PHOS: 111 U/L (ref 38–126)
ALT: 5 U/L — ABNORMAL LOW (ref 14–54)
AST: 14 U/L — ABNORMAL LOW (ref 15–41)
Anion gap: 6 (ref 5–15)
BILIRUBIN TOTAL: 1.3 mg/dL — AB (ref 0.3–1.2)
BUN: 12 mg/dL (ref 6–20)
CO2: 28 mmol/L (ref 22–32)
Calcium: 8.4 mg/dL — ABNORMAL LOW (ref 8.9–10.3)
Chloride: 105 mmol/L (ref 101–111)
Creatinine, Ser: 0.65 mg/dL (ref 0.44–1.00)
GFR calc Af Amer: >60 mL/min (ref >60)
GFR calc non Af Amer: >60 mL/min (ref >60)
Glucose, Bld: 88 mg/dL (ref 65–99)
Potassium: 3.8 mmol/L (ref 3.5–5.1)
Sodium: 139 mmol/L (ref 135–145)
TOTAL PROTEIN: 6.6 g/dL (ref 6.5–8.1)

## 2016-03-19 LAB — SEDIMENTATION RATE: SED RATE: 50 mm/h — AB (ref 0–22)

## 2016-03-19 LAB — C-REACTIVE PROTEIN: CRP: 1.2 mg/dL — AB (ref <1.0)

## 2016-03-19 LAB — GLUCOSE, CAPILLARY
Glucose-Capillary: 140 mg/dL — ABNORMAL HIGH (ref 65–99)
Glucose-Capillary: 108 mg/dL — ABNORMAL HIGH (ref 65–99)

## 2016-03-19 MED ORDER — METRONIDAZOLE IN NACL 5-0.79 MG/ML-% IV SOLN
500.0000 mg | Freq: Three times a day (TID) | INTRAVENOUS | Status: DC
  Filled 2016-03-19 (×2): qty 100

## 2016-03-19 MED ORDER — ALBUTEROL SULFATE (2.5 MG/3ML) 0.083% IN NEBU: 2.5000 mg | INHALATION_SOLUTION | Freq: Four times a day (QID) | RESPIRATORY_TRACT | Status: DC | PRN

## 2016-03-19 MED ORDER — FUROSEMIDE 40 MG PO TABS
40.0000 mg | ORAL_TABLET | Freq: Every day | ORAL | Status: DC
  Filled 2016-03-19: qty 1

## 2016-03-19 MED ORDER — INSULIN ASPART 100 UNIT/ML ~~LOC~~ SOLN
0.0000 [IU] | Freq: Three times a day (TID) | SUBCUTANEOUS | Status: DC
  Administered 2016-03-19: 2 [IU] via SUBCUTANEOUS
  Administered 2016-03-22 (×2): 3 [IU] via SUBCUTANEOUS
  Administered 2016-03-23: 2 [IU] via SUBCUTANEOUS
  Administered 2016-03-23: 3 [IU] via SUBCUTANEOUS
  Administered 2016-03-23: 5 [IU] via SUBCUTANEOUS
  Administered 2016-03-24: 3 [IU] via SUBCUTANEOUS

## 2016-03-19 MED ORDER — SODIUM CHLORIDE 0.9% FLUSH
10.0000 mL | INTRAVENOUS | Status: DC | PRN
  Administered 2016-03-22: 10 mL
  Filled 2016-03-19: qty 40

## 2016-03-19 MED ORDER — SENNA 8.6 MG PO TABS
1.0000 | ORAL_TABLET | Freq: Two times a day (BID) | ORAL | Status: DC
  Administered 2016-03-19 – 2016-03-24 (×8): 8.6 mg via ORAL
  Filled 2016-03-19 (×8): qty 1

## 2016-03-19 MED ORDER — DEXTROSE 5 % IV SOLN
2.0000 g | INTRAVENOUS | Status: DC
  Filled 2016-03-19: qty 2

## 2016-03-19 MED ORDER — DORZOLAMIDE HCL 2 % OP SOLN
1.0000 [drp] | Freq: Two times a day (BID) | OPHTHALMIC | Status: DC
  Administered 2016-03-19 – 2016-03-24 (×10): 1 [drp] via OPHTHALMIC
  Filled 2016-03-19: qty 10

## 2016-03-19 MED ORDER — METOPROLOL SUCCINATE ER 100 MG PO TB24
100.0000 mg | ORAL_TABLET | Freq: Every day | ORAL | Status: DC
  Administered 2016-03-20 – 2016-03-24 (×5): 100 mg via ORAL
  Filled 2016-03-19 (×6): qty 1

## 2016-03-19 MED ORDER — RIVAROXABAN 20 MG PO TABS
20.0000 mg | ORAL_TABLET | Freq: Every day | ORAL | Status: DC
  Filled 2016-03-19: qty 1

## 2016-03-19 MED ORDER — SPIRONOLACTONE 25 MG PO TABS
25.0000 mg | ORAL_TABLET | Freq: Every day | ORAL | Status: DC
  Administered 2016-03-20: 25 mg via ORAL
  Filled 2016-03-19: qty 1

## 2016-03-19 MED ORDER — MOMETASONE FURO-FORMOTEROL FUM 100-5 MCG/ACT IN AERO
2.0000 | INHALATION_SPRAY | Freq: Two times a day (BID) | RESPIRATORY_TRACT | Status: DC
  Administered 2016-03-20 – 2016-03-24 (×8): 2 via RESPIRATORY_TRACT
  Filled 2016-03-19 (×2): qty 8.8

## 2016-03-19 MED ORDER — ALBUTEROL SULFATE HFA 108 (90 BASE) MCG/ACT IN AERS: 2.0000 | INHALATION_SPRAY | Freq: Four times a day (QID) | RESPIRATORY_TRACT | Status: DC | PRN

## 2016-03-19 MED ORDER — FERROUS SULFATE 325 (65 FE) MG PO TABS
325.0000 mg | ORAL_TABLET | Freq: Every day | ORAL | Status: DC
  Administered 2016-03-20 – 2016-03-24 (×4): 325 mg via ORAL
  Filled 2016-03-19 (×4): qty 1

## 2016-03-19 MED ORDER — TRAMADOL HCL 50 MG PO TABS
50.0000 mg | ORAL_TABLET | Freq: Four times a day (QID) | ORAL | Status: DC | PRN
  Administered 2016-03-19 – 2016-03-24 (×10): 50 mg via ORAL
  Filled 2016-03-19 (×11): qty 1

## 2016-03-19 MED ORDER — BRIMONIDINE TARTRATE 0.15 % OP SOLN
1.0000 [drp] | Freq: Two times a day (BID) | OPHTHALMIC | Status: DC
  Administered 2016-03-19 – 2016-03-24 (×10): 1 [drp] via OPHTHALMIC
  Filled 2016-03-19: qty 5

## 2016-03-19 NOTE — Progress Notes (Signed)
No po meds given this evening as pt already had the meds today

## 2016-03-19 NOTE — Progress Notes (Signed)
RFV: follow up from recent hospitalization for MSSA bacteremia and Right Foot osteo Subjective:    Patient ID: [name], female    DOB: 01-18-1926, 82 y.o.   MRN: [id number]  HPI 82 y.o. with PMHx of HTN, combined CHF, chronic atrial fibrillation, pulmonary HTN, churg-strauss and PAD who presents for follow up for right foot osteomyelitis.She was hospitalized from June 11-18 th for MSSA bacteremia 2/2 infected diabetic wound ulcer on her right foot that included 1st MT head, lateral ulcer as well as eschar to her heal. During hospitalization, patient underwent an MRI of her right foot which showed early osteomyelitis of the dorsal medial head of the first metatarsal. She was evaluated by vascular surgery and underwent angioplasty of right posterior tibial artery. She underwent work up for MSSA bacteremia for which TTE on 6/13 was negative for any valvular vegetaiton. Due to her MRI findings for early osteo, she was discharged on IV Cefazolin through 7/23 through PICC as well as follow up with wound care weekly. She finished her course of treatment roughly 10 days ago, since then her diarrhea has resolved. She is still having some worsening drainage from right foot, particularly her heel.  She has been seen by Dr Lawerance Bach in the IM residents clinic yesterday where she complained of ongoing drainage to her foot and pain. (please see picture from 8/1). Decision was deferred to treat in ambulatory setting vs. Getting rehospitalized to address her wounds  The patient has been going to wound care center for at least 3 weekly visit, has some debridement last week to remove eschar but still has heavy exudate and drainage from heel.   She is here with her son, who has also noticed increasing foul odor from foot.  Allergies  Allergen Reactions  . Amiodarone Other (See Comments)    Contributed to hypoxia   Current Outpatient Prescriptions on File Prior to Visit  Medication Sig Dispense Refill  . albuterol  (PROVENTIL HFA;VENTOLIN HFA) 108 (90 Base) MCG/ACT inhaler Inhale 2 puffs into the lungs every 6 (six) hours as needed for wheezing or shortness of breath. 1 Inhaler 2  . Amino Acids-Protein Hydrolys (FEEDING SUPPLEMENT, PRO-STAT SUGAR FREE 64,) LIQD Take 30 mLs by mouth daily at 3 pm. 900 mL 0  . aspirin EC 81 MG tablet Take 1 tablet (81 mg total) by mouth daily. 90 tablet 2  . atorvastatin (LIPITOR) 40 MG tablet Take 1 tablet (40 mg total) by mouth daily. 30 tablet 11  . BD ULTRA-FINE LANCETS lancets Use to check blood sugar one time daily 100 each 5  . brimonidine (ALPHAGAN P) 0.1 % SOLN Place 1 drop into the left eye every 12 (twelve) hours. 45 mL 0  . dorzolamide (TRUSOPT) 2 % ophthalmic solution Place 1 drop into the left eye 2 (two) times daily. 10 mL 3  . DULERA 100-5 MCG/ACT AERO INHALE 2 PUFFS INTO THE LUNGS TWICE DAILY 13 g 3  . ferrous sulfate 325 (65 FE) MG tablet TAKE 1 TABLET(325 MG) BY MOUTH DAILY 30 tablet 3  . furosemide (LASIX) 80 MG tablet Take  in the AM and  in the PM 45 tablet 3  . glucose blood test strip Use as instructed 100 each 12  . lisinopril (PRINIVIL,ZESTRIL) 20 MG tablet Take 1 tablet (20 mg total) by mouth daily. 30 tablet 3  . metFORMIN (GLUCOPHAGE) 1000 MG tablet TAKE 1 TABLET(1000 MG) BY MOUTH TWICE DAILY WITH A MEAL 180 tablet 1  . metoprolol succinate (TOPROL-XL) 100  MG 24 hr tablet Take 1 tablet daily 60 tablet 0  . potassium chloride SA (K-DUR,KLOR-CON) 20 MEQ tablet Take 2 tablets (40 mEq total) by mouth daily. Only take this medication on days you take Lasix. 60 tablet 0  . ranitidine (ZANTAC) 75 MG tablet Take 75 mg by mouth daily as needed for heartburn.    . rivaroxaban (XARELTO) 20 MG TABS tablet Take 20 mg by mouth daily with supper.    Marland Kitchen spironolactone (ALDACTONE) 25 MG tablet TAKE 1 TABLET BY MOUTH EVERY DAY 90 tablet 0  . traMADol (ULTRAM) 50 MG tablet Take 1 tablet (50 mg total) by mouth every 6 (six) hours as needed. 30 tablet 0   No  current facility-administered medications on file prior to visit.    Active Ambulatory Problems    Diagnosis Date Noted  . GRAVE'S DISEASE 07/27/2006  . HYPERCHOLESTEROLEMIA 07/27/2006  . Glaucoma 07/27/2006  . Essential hypertension 07/27/2006  . CARDIOMYOPATHY, DILATED 07/27/2006  . OSTEOPENIA 07/27/2006  . Preventive measure 07/17/2011  . GERD (gastroesophageal reflux disease) 05/27/2012  . Chronic combined systolic and diastolic congestive heart failure (HCC) 05/18/2013  . Chronic atrial fibrillation (HCC) 05/18/2013  . Asthma in adult 05/18/2013  . Valvular heart disease 05/18/2013  . Churg-Strauss syndrome (HCC) 05/18/2013  . Pulmonary embolism (HCC) 08/18/2013  . Severe protein-calorie malnutrition (HCC) 08/19/2013  . Thyroid mass 08/28/2013  . Current use of long term anticoagulation--xarelto 04/05/2014  . Debilitated patient 05/02/2014  . Pulmonary hypertension (HCC) 05/02/2014  . Leukopenia 09/20/2014  . Goals of care, counseling/discussion 10/10/2014  . Anemia, iron deficiency 10/10/2014  . Abdominal wall hernia 11/02/2014  . Atherosclerosis of aorta (HCC) 12/08/2014  . Solitary pulmonary nodule 02/01/2015  . Leg pain, left 05/28/2015  . Acute upper respiratory infection 07/11/2015  . PAD (peripheral artery disease) (HCC) 07/18/2015  . Chronic venous insufficiency 07/18/2015  . Visual disturbance 08/23/2015  . Acute exacerbation of CHF (congestive heart failure) (HCC) 09/18/2015  . Fracture of right great toe 12/10/2015  . Osteomyelitis (HCC) 01/27/2016  . Diabetic foot infection (HCC) 01/27/2016  . Sepsis (HCC) 01/27/2016  . Staphylococcus aureus bacteremia with sepsis (HCC) 01/27/2016  . Pressure ulcer 01/29/2016  . Acute osteomyelitis of right foot (HCC)   . MSSA (methicillin susceptible Staphylococcus aureus) infection    Resolved Ambulatory Problems    Diagnosis Date Noted  . PSEUDOGOUT 07/18/2009  . ASTHMA 07/27/2006  . Knee pain, right 12/02/2010  .  Acute on chronic systolic heart failure (HCC) 03/04/2012  . Atrial tachycardia (HCC) 03/06/2012  . Epistaxis 11/02/2012  . Wound of right leg 05/02/2013  . Elevated troponin 05/18/2013  . Pericardial effusion 05/18/2013  . Hypokalemia 05/31/2013  . CAP (community acquired pneumonia) 07/18/2013  . Chronic combined systolic and diastolic heart failure (HCC) 08/19/2013  . Diastolic CHF (HCC) 08/19/2013  . Swelling of joint, knee, right 08/28/2013  . CHF (congestive heart failure) (HCC) 12/14/2013  . Leg swelling 02/24/2014  . Dyspnea 04/05/2014  . Sacral wound 04/26/2014  . Acute on chronic combined systolic and diastolic heart failure (HCC) 09/20/2014  . Hypoxia   . Nausea 11/02/2014  . Hemoptysis 12/01/2014  . Chronic systolic CHF (congestive heart failure) (HCC) 12/03/2014  . Diabetes mellitus without complication (HCC) 05/28/2015   Past Medical History:  Diagnosis Date  . Asthma   . Atrial fibrillation, permanent (HCC)   . Cataract   . CHF (congestive heart failure) (HCC)   . Chronic combined systolic and diastolic heart failure (HCC)   .  Churg-Strauss syndrome (HCC)   . Ectopic atrial tachycardia (HCC)   . Glaucoma   . Grave's disease   . Hypercholesteremia   . Hypertension   . Hypokalemia   . Lipoma   . Multifocal atrial tachycardia (HCC)   . NSVT (nonsustained ventricular tachycardia) (HCC)   . On home oxygen therapy   . Osteoarthritis   . Osteopenia   . Pericardial effusion   . Pleural effusion, left   . Pneumonia 1930's  . Pseudogout   . Pulmonary HTN (HCC)   . Shortness of breath   . Type II diabetes mellitus (HCC)   . Valvular heart disease   . VASCULITIS   . Venous insufficiency    Social History  Substance Use Topics  . Smoking status: Never Smoker  . Smokeless tobacco: Never Used  . Alcohol use No  family history includes Diabetes in her father and mother.  Review of Systems + chills, increasing drainage and pain from foot. Previously had  diarrhea x 2 wk now improved.10 point ros is otherwise negative    Objective:   Physical Exam Bp= 136/86, p 76, o2 sat 96, temp 98.6 Gen= frail, elderly female in NAD, in wheelchair, A XO By 3 HEENT = moist oral mucosa no thrush pulm = CTAB no w/c/r Cors = nl s1,s2 no g/m/r Ext = no edema, warm Skin = right foot shows a large calcaneal ulcer measuring 5 x 5 x 1.5cm deep - still has obscured wound bed, fibrinous/fat debris in wound bed 3 x 2.5cm shallow ulcer media lateral aspect of foot/by 1st MTH. Also has fibrinous debris in the wound bed     Assessment & Plan:  Right MTH osteo - wound bed still still has exudate within the area that likely needs debridement  Calcaneal ulcer/osteo of right foot - we spend 15 min doing debridement of the area but still has significant exudate. Bone not visualized but likely has osteo which would be new.- will admit for evaluation of worsening right foot ulcers to see if she is a better candidate for surgical debridement vs. transtibial amputation.   - if only doing debridement, may need repeat plan films to see depth of calcaneous wound and may need 2nd course of iv therapy  - recommend to get cbc with diff, sed rate, crp, and blood cultures. No labs were drawn during the visit since they would not impact decision to admit or not.   Please consult dr Zenaida Niece dam if need to give further iv abtx but she undergoes amputation, will not need abtx.  Spent 45 min with patient with greater than 50% coordination of care, hospital admisison  Linette Gunderson B. Drue Second MD MPH [facility] [phone]

## 2016-03-19 NOTE — H&P (Signed)
Date: 03/19/2016               Patient Name:  [name] MRN: [id number]  DOB: 03/30/1926 Age / Sex: 82 y.o., female   PCP: Lucious Groves, DO              Medical Service: Internal Medicine Teaching Service              Attending Physician: Dr. Bartholomew Crews, MD    First Contact: Pershing Cox, [place] Pager: [phone]  Second Contact: Dr. Charlott Rakes Pager: [phone]                       Chief Complaint: right foot pain and drainage  History of Present Illness: [name] is an 82yo F with a PMH of TIIDM, PAD, HTN, combined CHF, chronic Atrial fibrillation on anticoagulation, pulmonary HTN, and Churg-Strauss who presents with worsening drainage and pain in right foot. She was hospitalized June 11-18th for MSSA bacteremia 2/2 an infected diabetic wound ulcer on her right foot that included 1st MT head, lateral ulcer, and calcaneal eschar. MRI of the right foot showed early signs of osteomyelitis in the dorsal medial head of the first metatarsal. Vascular surgery was consulted for her nonhealing wound and decreased ABI's, and [name] underwent angioplasty of the right posterior tibial artery. With early signs of osteo on MRI, she was discharged on 6 weeks iv cefazolin which she completed on 7/23. She has also seen wound care weekly for dressing changes and debridement.  [name] notes that over the past 2 weeks, she has felt increasingly weak. She usually ambulates with a walker, cooks, and bathes herself. However, her weakness and the pain in her right foot have limited her ADL's significantly. She now relies on her daughter and home nurse to bathe, use the toilet, and ambulate. She endorses a good appetite without fever, nausea, or vomiting. This morning at her ID follow-up, [name]'s son endorsed an increasing foul odor from his mother's foot and worsening drainage. She was admitted to [facility] for further work-up and management of potential right foot  osteomyelitis.  Meds: Current Outpatient Prescriptions on File Prior to Visit  Medication Sig Dispense Refill  . albuterol (PROVENTIL HFA;VENTOLIN HFA) 108 (90 Base) MCG/ACT inhaler Inhale 2 puffs into the lungs every 6 (six) hours as needed for wheezing or shortness of breath. 1 Inhaler 2  . Amino Acids-Protein Hydrolys (FEEDING SUPPLEMENT, PRO-STAT SUGAR FREE 64,) LIQD Take 30 mLs by mouth daily at 3 pm. 900 mL 0  . aspirin EC 81 MG tablet Take 1 tablet (81 mg total) by mouth daily. 90 tablet 2  . atorvastatin (LIPITOR) 40 MG tablet Take 1 tablet (40 mg total) by mouth daily. 30 tablet 11  . BD ULTRA-FINE LANCETS lancets Use to check blood sugar one time daily 100 each 5  . brimonidine (ALPHAGAN P) 0.1 % SOLN Place 1 drop into the left eye every 12 (twelve) hours. 45 mL 0  . dorzolamide (TRUSOPT) 2 % ophthalmic solution Place 1 drop into the left eye 2 (two) times daily. 10 mL 3  . DULERA 100-5 MCG/ACT AERO INHALE 2 PUFFS INTO THE LUNGS TWICE DAILY 13 g 3  . ferrous sulfate 325 (65 FE) MG tablet TAKE 1 TABLET(325 MG) BY MOUTH DAILY 30 tablet 3  . furosemide (LASIX) 80 MG tablet Take 69m in the AM and 466min the PM 45 tablet 3  . glucose blood test  strip Use as instructed 100 each 12  . lisinopril (PRINIVIL,ZESTRIL) 20 MG tablet Take 1 tablet (20 mg total) by mouth daily. 30 tablet 3  . metFORMIN (GLUCOPHAGE) 1000 MG tablet TAKE 1 TABLET(1000 MG) BY MOUTH TWICE DAILY WITH A MEAL 180 tablet 1  . metoprolol succinate (TOPROL-XL) 100 MG 24 hr tablet Take 1 tablet daily 60 tablet 0  . potassium chloride SA (K-DUR,KLOR-CON) 20 MEQ tablet Take 2 tablets (40 mEq total) by mouth daily. Only take this medication on days you take Lasix. 60 tablet 0  . ranitidine (ZANTAC) 75 MG tablet Take 75 mg by mouth daily as needed for heartburn.    . rivaroxaban (XARELTO) 20 MG TABS tablet Take 20 mg by mouth daily with supper.    Marland Kitchen spironolactone (ALDACTONE) 25 MG tablet TAKE 1 TABLET BY MOUTH EVERY DAY 90  tablet 0  . traMADol (ULTRAM) 50 MG tablet Take 1 tablet (50 mg total) by mouth every 6 (six) hours as needed. 30 tablet 0    Allergies: Allergies as of 03/19/2016 - Review Complete 03/19/2016  Allergen Reaction Noted  . Amiodarone Other (See Comments) 09/27/2014   Past Medical History:  Diagnosis Date  . Asthma   . Atrial fibrillation, permanent ([place])    a. Dx 04/2013, on metoprolol, dig and xarelto  . Cataract    left eye  . CHF (congestive heart failure) ([place])   . Chronic combined systolic and diastolic heart failure ([place])    a. 02/2012 EF:20-25%  . Churg-Strauss syndrome ([place])    a. Sural nerve biopsy 02/2000.  . Ectopic atrial tachycardia ([place])    a. Documented on tele as IP 02/2012.  . Glaucoma   . Grave's disease    a. s/p thyroidectomy.  . Hypercholesteremia   . Hypertension   . Hypokalemia    a. Previously felt due to diuretics, req supplementation.  . Lipoma    left inner thigh  . Multifocal atrial tachycardia (HCC)    a. Documented as OP 01/2012.  Marland Kitchen NSVT (nonsustained ventricular tachycardia) ([place])    a. During CHF adm 2001.  . On home oxygen therapy    "wear it mostly at night" (09/20/2014)  . Osteoarthritis    left shoulder  . Osteopenia    DEXA 2004  . Pericardial effusion    a. Echo 02/2012: small-mod pericardial effusion.  . Pleural effusion, left    a. Thoracentesis 2001 - per notes, no malignant cells, was transudative.  . Pneumonia 1930's  . Pseudogout   . Pulmonary HTN ([place])    a. Mod by echo 02/2012.  Marland Kitchen Shortness of breath   . Type II diabetes mellitus ([place])   . Valvular heart disease    a. Echo 02/2012: mod MR/TR, mild AI.  Marland Kitchen VASCULITIS   . Venous insufficiency    Past Surgical History:  Procedure Laterality Date  . ABDOMINAL HYSTERECTOMY    . CATARACT EXTRACTION Right   . FRACTURE SURGERY    . HEMIARTHROPLASTY HIP Right 10/2010   cemented for hip fracture, femoral neck - by Johnny Bridge, MD  . PERIPHERAL VASCULAR CATHETERIZATION N/A  01/30/2016   Procedure: Abdominal Aortogram w/Lower Extremity;  Surgeon: Serafina Mitchell, MD;  Location: [place] [facility];  Service: Cardiovascular;  Laterality: N/A;  . PERIPHERAL VASCULAR CATHETERIZATION Right 01/30/2016   Procedure: Peripheral Vascular Balloon Angioplasty;  Surgeon: Serafina Mitchell, MD;  Location: [place] [facility];  Service: Cardiovascular;  Laterality: Right;  Posterior tibial  . SALPINGOOPHORECTOMY    .  SURAL NERVE BIOPSY  02/2000  . THYROIDECTOMY     Family History  Problem Relation Age of Onset  . Diabetes Mother   . Diabetes Father   . Heart disease Neg Hx    Social History   Social History  . Marital status: Widowed    Spouse name: N/A  . Number of children: N/A  . Years of education: N/A   Occupational History  . Retired    Social History Main Topics  . Smoking status: Never Smoker  . Smokeless tobacco: Never Used  . Alcohol use No  . Drug use: No  . Sexual activity: Not on file   Other Topics Concern  . Not on file   Social History Narrative   Lives in house, with family just next door.    Never smoker, no alcohol.      Used to work at [employer].    Before that, worked in food prep.                    Review of Systems: Pertinent items noted in HPI and remainder of comprehensive ROS otherwise negative.  Physical Exam: Blood pressure (!) 128/59, pulse 79, temperature 99.1 F (37.3 C), temperature source Oral, resp. rate 18, SpO2 96 %. BP (!) 128/59 (BP Location: Left Arm)   Pulse 79   Temp 99.1 F (37.3 C) (Oral)   Resp 18   SpO2 96%  General appearance: alert, cooperative and no distress Head: Normocephalic, without obvious abnormality, atraumatic Eyes: EOMI. No gross pupillary abnormalities. No scleral icterus. Ears: Hearing grossly intact. Nose: Nares normal. Septum midline. Mucosa normal. No drainage or sinus tenderness. Throat: MMM. Edentulous. Lungs: clear to auscultation bilaterally Heart:  irregularly irregular rhythm, normal rate. III/VI holosystolic ejection murmur at LUSB/LLSB. No click, rub or gallop. Abdomen: soft, non-tender; bowel sounds normal; no masses,  no organomegaly. Small umbilical hernia present. Extremities: Right foot: large calcaneal ulcer (measurements in ID office 5x5x1.5cm deep). Shallow (3x2.5cm) ulcer medial aspect of foot near 1st MTH. Small ulcer on distal hallux. No surrounding erythema or warmth. Pulses 1+ DP, PT bilaterally. Neurologic: Grossly normal, alert and oriented x3, some difficulty with recall  Lab results: pending  Imaging results:  None at this time  Assessment & Plan by Problem: Active Problems: Diabetic ulcer of right foot Other Problems: TIIDM Chronic Atrial fibrillation on anticoagulation Chronic biventricular heart failure Peripheral artery disease  Diabetic Ulcer of right foot: Afebrile. Large calcaneal ulcer (5x5x1.5cm), shallow ulcer near 1st MTH (3x2.5cm), small ulcer distal hallux. Ulcers likely 2/2 long-standing DM given location in areas of pressure though PAD is also a potential cause. Regardless of cause, wounds are worsening despite 6 weeks of iv cefazolin. Now need to consider surgical debridement vs. amputation vs. second course of antibiotic therapy.   --consult ortho  --wound care consult  --blood cx x2 pending  --abx pending ortho consult  --CBC, CMP, ESR, CRP pending   TIIDM: on metformin. HbA1c 7.9 on 01/27/2016.  --hold metformin  --SSI with meals  Chronic Atrial fibrillation: continue home xarelto 37m  Chronic biventricular heart failure: Echo 01/29/2016 shows EF 25-30% with severe Tr, severe RV dysfxn. She takes furosemide 80 mg in am, 40 mg in pm; spironolactone 25 mg qday, lisinopril 232mqday, and metoprolol 10042mday at home.   --continue home meds  Peripheral artery disease: arteriogram 01/30/2016 revealed occlusion of all 3 tibial vessels in RLE and mild stenosis of distal superficial femoral and  proximal popliteal  arteries in LLE. Angioplasty of entire posterior tibial artery was performed with good return of flow post-procedure.   --continue lipitor 2m  FEN: Regular diet with SSI for now. May transition to NPO pending ortho recs.  --continue zantac 749m DVT ppx: Continue home xarelto 20 mg  Code Status: DNR  Dispo: Patient currently has a home health nurse and home PT. She lives with her daughter. Will further discuss appropriate living situation with patient and family. Anticipate stay of at least one week.  This is a [employer] information officerote.  The care of the patient was discussed with Dr. PaPosey Prontond the assessment and plan was formulated with their assistance.  Please see their note for official documentation of the patient encounter.   Signed: TaPershing CoxMedical Student 03/19/2016, 2:37 PM

## 2016-03-19 NOTE — Progress Notes (Signed)
Pt came in as a direct admission. No orders, MD paged

## 2016-03-19 NOTE — Progress Notes (Signed)
Pt's daughter [name] says that pt should not be getting lasix until her weight goes beyond 150pounds. Says that she wants someone to call her and discuss this issue. Her phone number is [phone]

## 2016-03-19 NOTE — H&P (Signed)
Date: 03/19/2016               Patient Name:  [name] MRN: [id number]  DOB: [birth year] Age / Sex: 82 y.o., female   PCP: Lucious Groves, DO         Medical Service: Internal Medicine Teaching Service         Attending Physician: Dr. Bartholomew Crews, MD    First Contact: Pershing Cox, MS4 Pager: [phone]  Second Contact: Dr. Charlott Rakes Pager: [phone]       After Hours (After 5p/  First Contact Pager: [phone]  weekends / holidays): Second Contact Pager: [phone]   Chief Complaint: Right foot pain and drainage  History of Present Illness: [name] is an 82 year old female with non-insulin-dependent type 2 diabetes, peripheral vascular disease s/p angioplasty, hypertension, combined systolic and diastolic CHF [EF 59-97%, 7/41/42], chronic atrial fibrillation on anticoagulation, pulmonary hypertension, Churg-Strauss vasculitis who presents today with worsening drainage and pain in her right foot. As she cannot recall several important details of her medical history, interview was supplemented with information obtained from the chart.  She was hospitalized 01/26/16 through 02/03/16 for MSSA bacteremia secondary to an infected diabetic wound ulcer on her right foot. Right foot MRI 01/27/16 was notable for early signs of osteomyelitis of the dorsal medial head of the first metatarsal. Vascular Surgery was consulted during this admission. Arteriogram 01/30/16 notable for occlusion of all 3 tibial vessels in the right lower extremity with mild stenosis of distal superficial femoral and proximal popliteal arteries in the left lower extremity, and she underwent angioplasty of the right posterior tibial artery. Given the findings of osteomyelitis, she was discharged on 6 weeks course of IV cefazolin which she completed on 7/23.  In the interval following her hospital discharge, she has been attending her weekly wound care visits for dressing changes and debridement. She does endorse feeling  weak and possibly some subjective chills particularly over the last 2 weeks but denies any fever, changes in appetite, nausea, vomiting, abdominal pain. At baseline, she requires assistance with ambulation by way of a walker but is able to perform her other ADLs.  Today, she was seen by Dr. Graylon Good [ID] for follow-up, and given the increasing foul odor and drainage, she was directly admitted for further workup of osteomyelitis.   Meds:  Prescriptions Prior to Admission  Medication Sig Dispense Refill Last Dose  . albuterol (PROVENTIL HFA;VENTOLIN HFA) 108 (90 Base) MCG/ACT inhaler Inhale 2 puffs into the lungs every 6 (six) hours as needed for wheezing or shortness of breath. 1 Inhaler 2 Taking  . Amino Acids-Protein Hydrolys (FEEDING SUPPLEMENT, PRO-STAT SUGAR FREE 64,) LIQD Take 30 mLs by mouth daily at 3 pm. 900 mL 0 Taking  . aspirin EC 81 MG tablet Take 1 tablet (81 mg total) by mouth daily. 90 tablet 2 Taking  . atorvastatin (LIPITOR) 40 MG tablet Take 1 tablet (40 mg total) by mouth daily. 30 tablet 11 Taking  . BD ULTRA-FINE LANCETS lancets Use to check blood sugar one time daily 100 each 5 Taking  . brimonidine (ALPHAGAN P) 0.1 % SOLN Place 1 drop into the left eye every 12 (twelve) hours. 45 mL 0 Taking  . dorzolamide (TRUSOPT) 2 % ophthalmic solution Place 1 drop into the left eye 2 (two) times daily. 10 mL 3 Taking  . DULERA 100-5 MCG/ACT AERO INHALE 2 PUFFS INTO THE LUNGS TWICE DAILY 13 g 3 Taking  . ferrous sulfate 325 (65  FE) MG tablet TAKE 1 TABLET(325 MG) BY MOUTH DAILY 30 tablet 3 Taking  . furosemide (LASIX) 80 MG tablet Take 44m in the AM and 448min the PM 45 tablet 3 Taking  . glucose blood test strip Use as instructed 100 each 12 Taking  . lisinopril (PRINIVIL,ZESTRIL) 20 MG tablet Take 1 tablet (20 mg total) by mouth daily. 30 tablet 3 Taking  . metFORMIN (GLUCOPHAGE) 1000 MG tablet TAKE 1 TABLET(1000 MG) BY MOUTH TWICE DAILY WITH A MEAL 180 tablet 1 Taking  . metoprolol  succinate (TOPROL-XL) 100 MG 24 hr tablet Take 1 tablet daily 60 tablet 0 Taking  . potassium chloride SA (K-DUR,KLOR-CON) 20 MEQ tablet Take 2 tablets (40 mEq total) by mouth daily. Only take this medication on days you take Lasix. 60 tablet 0 Taking  . ranitidine (ZANTAC) 75 MG tablet Take 75 mg by mouth daily as needed for heartburn.   Taking  . rivaroxaban (XARELTO) 20 MG TABS tablet Take 20 mg by mouth daily with supper.   Taking  . spironolactone (ALDACTONE) 25 MG tablet TAKE 1 TABLET BY MOUTH EVERY DAY 90 tablet 0 Taking  . traMADol (ULTRAM) 50 MG tablet Take 1 tablet (50 mg total) by mouth every 6 (six) hours as needed. 30 tablet 0 Taking    Allergies: Allergies as of 03/19/2016 - Review Complete 03/19/2016  Allergen Reaction Noted  . Amiodarone Other (See Comments) 09/27/2014   Past Medical History:  Diagnosis Date  . Asthma   . Atrial fibrillation, permanent ([facility]   a. Dx 04/2013, on metoprolol, dig and xarelto  . Cataract    left eye  . CHF (congestive heart failure) ([facility]  . Chronic combined systolic and diastolic heart failure ([facility] [place]   a. 02/2012 EF:20-25%  . Churg-Strauss syndrome ([facility]   a. Sural nerve biopsy 02/2000.  . Ectopic atrial tachycardia ([facility]   a. Documented on tele as IP 02/2012.  . Glaucoma   . Grave's disease    a. s/p thyroidectomy.  . Hypercholesteremia   . Hypertension   . Hypokalemia    a. Previously felt due to diuretics, req supplementation.  . Lipoma    left inner thigh  . Multifocal atrial tachycardia (HCC)    a. Documented as OP 01/2012.  . Marland KitchenSVT (nonsustained ventricular tachycardia) ([facility]Ze[facility]   a. During CHF adm 2001.  . On home oxygen therapy    "wear it mostly at night" (09/20/2014)  . Osteoarthritis    left shoulder  . Osteopenia    DEXA 2004  . Pericardial effusion    a. Echo 02/2012: small-mod pericardial effusion.  . Pleural effusion, left    a. Thoracentesis 2001 - per notes, no malignant cells, was transudative.  . Pneumonia  1930's  . Pseudogout   . Pulmonary HTN ([facility] [place]   a. Mod by echo 02/2012.  . Marland Kitchenhortness of breath   . Type II diabetes mellitus (HCM[facility]  . Valvular heart disease    a. Echo 02/2012: mod MR/TR, mild AI.  . Marland KitchenASCULITIS   . Venous insufficiency    Family History: "My mother died with sugar."  Social History: She used to work at [employer]ives at home with her kids nearby.  Review of Systems: A complete ROS was negative except as per HPI.   Physical Exam: Blood pressure (!) 128/59, pulse 79, temperature 99.1 F (37.3 C), temperature source Oral, resp. rate 18, height _0  (1.702 m), weight 136 lb 11.2  oz (62 kg), SpO2 96 %.  General: elderly African American female, resting in bed HEENT: PERRL, no scleral icterus, oropharynx clear Cardiac: irregular rhythm, ?systolic ejection murmur at LUSB Pulm: clear to auscultation bilaterally, no wheezes, rales, or rhonchi Abd: soft, nontender, nondistended, BS present Ext: R foot cool to touch with 1+ DP. Two ulcers as noted below. Neuro: responds to questions appropriately; moving all extremities freely        Assessment & Plan by Problem: Principal Problem:   Right foot ulcer (HCC) Active Problems:   Essential hypertension   Chronic combined systolic and diastolic congestive heart failure (HCC)   Chronic atrial fibrillation (HCC)   Severe protein-calorie malnutrition (HCC)   Pulmonary hypertension (HCC)   PAD (peripheral artery disease) ([place])  [name] is an 82 year old female with non-insulin-dependent type 2 diabetes, peripheral vascular disease s/p angioplasty, hypertension, combined systolic and diastolic CHF [EF 03-01%, 4/99/69], chronic atrial fibrillation on anticoagulation, pulmonary hypertension, Churg-Strauss vasculitis who presents today with worsening drainage and pain in her right foot.  Right foot ulcer: Possibly multifactorial in the setting of her diabetes and vascular disease which are contributed to poor healing. Given  her recent antibiotics and healthcare exposure, MRSA and Pseudomonas are possibilities though she is currently hemodynamically stable and afebrile which is reassuring.  -Consult orthopedics to decide debridement versus amputation -Consult wound care for recommendations -Check right foot MRI with contrast -Follow-up blood cultures -Check ESR/CRP, CMET, CBC  Non-insulin-dependent type 2 diabetes: A1c 7.9, 01/27/16. Her medications include metformin 1000 mg twice daily.. -Start sensitive sliding scale insulin with meals -Give regular diet  Chronic combined systolic and diastolic congestive heart failure: Echo 01/29/16 with EF 25-30% with severe tricuspid regurgitation, severe RV dysfunction, diffuse hypokinesis, mild aortic regurgitation, moderate mitral regurgitation, severe left atrial dilatation, moderate RA dilatation. A medications include Lasix 80 mg in the morning, 40 mg in the evening, spironolactone 25 mg daily, lisinopril 20 mg daily, metoprolol succinate 100 mg daily. -Continue all home medications except lisinopril given contrast load to avoid risk of acute kidney injury  Peripheral artery disease status post angioplasty: Continue atorvastatin 40 mg  Chronic atrial fibrillation on anticoagulation: Continue home rivoroxaban 20 mg with dinner.  #FEN:  -Diet: Regular  #DVT prophylaxis: Rivaroxaban  #CODE STATUS: DNR/DNI -Defer to Cory Munch [daughter] if patients lacks decision-making capacity -Confirmed with patient on admission  Dispo: Admit patient to Inpatient with expected length of stay greater than 2 midnights.  Signed: Riccardo Dubin, MD 03/19/2016, 1:18 PM  Pager: _0 @

## 2016-03-20 ENCOUNTER — Other Ambulatory Visit (HOSPITAL_COMMUNITY): Payer: Self-pay | Admitting: Family

## 2016-03-20 DIAGNOSIS — Z7984 Long term (current) use of oral hypoglycemic drugs: Secondary | ICD-10-CM

## 2016-03-20 DIAGNOSIS — I70234 Atherosclerosis of native arteries of right leg with ulceration of heel and midfoot: Secondary | ICD-10-CM

## 2016-03-20 DIAGNOSIS — Z7901 Long term (current) use of anticoagulants: Secondary | ICD-10-CM

## 2016-03-20 DIAGNOSIS — E1152 Type 2 diabetes mellitus with diabetic peripheral angiopathy with gangrene: Secondary | ICD-10-CM

## 2016-03-20 DIAGNOSIS — E11621 Type 2 diabetes mellitus with foot ulcer: Secondary | ICD-10-CM

## 2016-03-20 DIAGNOSIS — I5042 Chronic combined systolic (congestive) and diastolic (congestive) heart failure: Secondary | ICD-10-CM

## 2016-03-20 DIAGNOSIS — L97514 Non-pressure chronic ulcer of other part of right foot with necrosis of bone: Secondary | ICD-10-CM

## 2016-03-20 DIAGNOSIS — Z955 Presence of coronary angioplasty implant and graft: Secondary | ICD-10-CM

## 2016-03-20 DIAGNOSIS — I482 Chronic atrial fibrillation: Secondary | ICD-10-CM

## 2016-03-20 LAB — GLUCOSE, CAPILLARY
GLUCOSE-CAPILLARY: 96 mg/dL (ref 65–99)
Glucose-Capillary: 91 mg/dL (ref 65–99)
Glucose-Capillary: 186 mg/dL — ABNORMAL HIGH (ref 65–99)
Glucose-Capillary: 138 mg/dL — ABNORMAL HIGH (ref 65–99)

## 2016-03-20 LAB — SURGICAL PCR SCREEN
MRSA, PCR: NEGATIVE
Staphylococcus aureus: NEGATIVE

## 2016-03-20 MED ORDER — SODIUM CHLORIDE 0.9 % IV SOLN
INTRAVENOUS | Status: DC
  Administered 2016-03-20: 13:00:00 via INTRAVENOUS

## 2016-03-20 MED ORDER — ADULT MULTIVITAMIN W/MINERALS CH
1.0000 | ORAL_TABLET | Freq: Every day | ORAL | Status: DC
  Administered 2016-03-20 – 2016-03-24 (×4): 1 via ORAL
  Filled 2016-03-20 (×4): qty 1

## 2016-03-20 MED ORDER — CHLORHEXIDINE GLUCONATE 4 % EX LIQD
60.0000 mL | Freq: Once | CUTANEOUS | Status: AC
  Administered 2016-03-21: 4 via TOPICAL
  Filled 2016-03-20: qty 60

## 2016-03-20 MED ORDER — CEFAZOLIN SODIUM-DEXTROSE 2-4 GM/100ML-% IV SOLN
2.0000 g | INTRAVENOUS | Status: AC
  Administered 2016-03-21: 2 g via INTRAVENOUS
  Filled 2016-03-20 (×3): qty 100

## 2016-03-20 MED ORDER — RIVAROXABAN 15 MG PO TABS
15.0000 mg | ORAL_TABLET | Freq: Every day | ORAL | Status: DC
  Administered 2016-03-22 – 2016-03-23 (×2): 15 mg via ORAL
  Filled 2016-03-20 (×2): qty 1

## 2016-03-20 MED ORDER — RIVAROXABAN 15 MG PO TABS: 15.0000 mg | ORAL_TABLET | Freq: Every day | ORAL | Status: DC

## 2016-03-20 NOTE — Progress Notes (Signed)
Hospitalist paged to verify npo status. Per ortho  Surgeon's orders, pt should be npo from midnight tonight but current orders have pt as nothing by mouth. Pt is also a diabetic but is receiving normal saline only at 58ml/hr. Waiting for return call

## 2016-03-20 NOTE — Consult Note (Signed)
ORTHOPAEDIC CONSULTATION  REQUESTING PHYSICIAN: Burns Spain, MD  Chief Complaint: Necrotic right heel ulcer  HPI: [name] is a 82 y.o. female who presents with necrotic right heel ulcer. Patient is an 82 year old woman with severe peripheral vascular disease who presents with a necrotic foul-smelling ulcer of the right heel. Patient states she has been going to the wound Center for wound treatment.  Past Medical History:  Diagnosis Date  . Asthma   . Atrial fibrillation, permanent (HCC)    a. Dx 04/2013, on metoprolol, dig and xarelto  . Cataract    left eye  . CHF (congestive heart failure) (HCC)   . Chronic combined systolic and diastolic heart failure (HCC)    a. 02/2012 EF:20-25%  . Churg-Strauss syndrome (HCC)    a. Sural nerve biopsy 02/2000.  . Ectopic atrial tachycardia (HCC)    a. Documented on tele as IP 02/2012.  . Glaucoma   . Grave's disease    a. s/p thyroidectomy.  . Hypercholesteremia   . Hypertension   . Hypokalemia    a. Previously felt due to diuretics, req supplementation.  . Lipoma    left inner thigh  . Multifocal atrial tachycardia (HCC)    a. Documented as OP 01/2012.  Marland Kitchen NSVT (nonsustained ventricular tachycardia) (HCC)    a. During CHF adm 2001.  . On home oxygen therapy    "wear it mostly at night" (09/20/2014)  . Osteoarthritis    left shoulder  . Osteopenia    DEXA 2004  . Pericardial effusion    a. Echo 02/2012: small-mod pericardial effusion.  . Pleural effusion, left    a. Thoracentesis 2001 - per notes, no malignant cells, was transudative.  . Pneumonia 1930's  . Pseudogout   . Pulmonary HTN (HCC)    a. Mod by echo 02/2012.  Marland Kitchen Shortness of breath   . Type II diabetes mellitus (HCC)   . Valvular heart disease    a. Echo 02/2012: mod MR/TR, mild AI.  Marland Kitchen VASCULITIS   . Venous insufficiency    Past Surgical History:  Procedure Laterality Date  . ABDOMINAL HYSTERECTOMY    . CATARACT EXTRACTION Right   . FRACTURE SURGERY     . HEMIARTHROPLASTY HIP Right 10/2010   cemented for hip fracture, femoral neck - by Eulas Post, MD  . PERIPHERAL VASCULAR CATHETERIZATION N/A 01/30/2016   Procedure: Abdominal Aortogram w/Lower Extremity;  Surgeon: Nada Libman, MD;  Location: [facility];  Service: Cardiovascular;  Laterality: N/A;  . PERIPHERAL VASCULAR CATHETERIZATION Right 01/30/2016   Procedure: Peripheral Vascular Balloon Angioplasty;  Surgeon: Nada Libman, MD;  Location: [facility] INVASIVE CV LAB;  Service: Cardiovascular;  Laterality: Right;  Posterior tibial  . SALPINGOOPHORECTOMY    . SURAL NERVE BIOPSY  02/2000  . THYROIDECTOMY     Social History   Social History  . Marital status: Widowed    Spouse name: N/A  . Number of children: N/A  . Years of education: N/A   Occupational History  . Retired    Social History Main Topics  . Smoking status: Never Smoker  . Smokeless tobacco: Never Used  . Alcohol use No  . Drug use: No  . Sexual activity: Not on file   Other Topics Concern  . Not on file   Social History Narrative   Lives in house, with family just next door.    Never smoker, no alcohol.      Used to work at  [employer].    Before that, worked in food prep.                   Family History  Problem Relation Age of Onset  . Diabetes Mother   . Diabetes Father   . Heart disease Neg Hx    - negative except otherwise stated in the family history section Allergies  Allergen Reactions  . Amiodarone Other (See Comments)    Contributed to hypoxia   Prior to Admission medications   Medication Sig Start Date End Date Taking? Authorizing Provider  albuterol (PROVENTIL HFA;VENTOLIN HFA) 108 (90 Base) MCG/ACT inhaler Inhale 2 puffs into the lungs every 6 (six) hours as needed for wheezing or shortness of breath. 09/18/15  Yes Alex Elson Clan, DO  brimonidine (ALPHAGAN P) 0.1 % SOLN Place 1 drop into the left eye every 12 (twelve) hours. 07/11/15  Yes Darrick Huntsman,  MD  dorzolamide (TRUSOPT) 2 % ophthalmic solution Place 1 drop into the left eye 2 (two) times daily. 07/11/15  Yes Darrick Huntsman, MD  DULERA 100-5 MCG/ACT AERO INHALE 2 PUFFS INTO THE LUNGS TWICE DAILY 09/07/15  Yes Yolanda Manges, DO  ferrous sulfate 325 (65 FE) MG tablet TAKE 1 TABLET(325 MG) BY MOUTH DAILY 07/11/15  Yes Darrick Huntsman, MD  furosemide (LASIX) 80 MG tablet Take 80mg  in the AM and 40mg  in the PM 01/22/16  Yes Laurey Morale, MD  lisinopril (PRINIVIL,ZESTRIL) 20 MG tablet Take 1 tablet (20 mg total) by mouth daily. 02/05/16  Yes Gust Rung, DO  loperamide (IMODIUM A-D) 2 MG tablet Take 2 mg by mouth 4 (four) times daily as needed for diarrhea or loose stools.   Yes Historical Provider, MD  metFORMIN (GLUCOPHAGE) 1000 MG tablet TAKE 1 TABLET(1000 MG) BY MOUTH TWICE DAILY WITH A MEAL 09/20/15  Yes Yolanda Manges, DO  metoprolol succinate (TOPROL-XL) 100 MG 24 hr tablet Take 1 tablet daily 02/03/16  Yes Nishant Dhungel, MD  ranitidine (ZANTAC) 75 MG tablet Take 75 mg by mouth daily as needed for heartburn (or nausea).    Yes Historical Provider, MD  rivaroxaban (XARELTO) 20 MG TABS tablet Take 20 mg by mouth daily with supper.   Yes Historical Provider, MD  spironolactone (ALDACTONE) 25 MG tablet TAKE 1 TABLET BY MOUTH EVERY DAY 03/05/16  Yes Gust Rung, DO  traMADol (ULTRAM) 50 MG tablet Take 1 tablet (50 mg total) by mouth every 6 (six) hours as needed. 03/18/16 03/18/17 Yes Alexa Lucrezia Starch, MD  BD ULTRA-FINE LANCETS lancets Use to check blood sugar one time daily 08/23/15   Jana Half, MD  glucose blood test strip Use as instructed 07/11/15   Darrick Huntsman, MD   No results found. - pertinent xrays, CT, MRI studies were reviewed and independently interpreted  Positive ROS: All other systems have been reviewed and were otherwise negative with the exception of those mentioned in the HPI and as above.  Physical Exam: General: Alert, no acute distress Cardiovascular: No  pedal edema Respiratory: No cyanosis, no use of accessory musculature GI: No organomegaly, abdomen is soft and non-tender Skin: Patient has a foul-smelling necrotic ulcer involving the entire heel pad. This probes to bone. She also has a necrotic ulcer medially over the great toe MTP joint which is also down to bone. Patient does have a faintly palpable dorsalis pedis pulse. Neurologic: Patient does not have protective sensation. Psychiatric: Patient is competent for consent  with normal mood and affect Lymphatic: No axillary or cervical lymphadenopathy  MUSCULOSKELETAL:  On examination patient has wet gangrene of the right heel as well as a necrotic ulcer of the right great toe MTP joint. The ulcers go down to bone.  Assessment: Assessment: Wet gangrene involving the right heel including the entire heel pad with osteomyelitis of the calcaneus as well as ulceration and osteomyelitis of the great toe MTP joint.  Plan: Plan: Patient's only option for limb salvage would be a transtibial amputation. With the wet gangrenous changes of the entire heel pad there are no foot salvage options. We will plan for a transtibial amputation on the right tomorrow afternoon. I discussed with the patient this is a life-threatening necrotic wound.  Thank you for the consult and the opportunity to see Ms. Brandy Hale, MD [facility] [phone] 7:14 AM

## 2016-03-20 NOTE — Care Management Important Message (Signed)
Important Message  Patient Details  Name: [name] MRN: [id number] Date of Birth: 07/21/1926   Medicare Important Message Given:  Yes    Bernadette Hoit 03/20/2016, 11:04 AM

## 2016-03-20 NOTE — Progress Notes (Signed)
  Date: 03/20/2016  Patient name: Cheronda Lathrop  Medical record number: [id number]  Date of birth: 1926-02-05   I have seen and evaluated Ferne Coe and discussed their care with the Residency Team. Ms Deitz is an 82 yo woman who was admitted and tx for osteomyelitis and MSSA bacteremia in June with 6 weeks IV ABX. During that stay, she was found to have sig PAD and and angioplasty of the R post tib artery. Her ABX were completed on 7/23. She has had 2 weeks of weakness, chills, increased dependence for ADLs, increased drainage from R foot wounds, and odor from wounds. She was admitted from [facility].  Dr Lajoyce Corners has eval pt and rec trans tibial amputation. Vascular eval pt and re either BKA or AKA.   PMHx, Fam Hx, and/or Soc Hx : Lives with daughter. Indep in ADL's prior to 2 weeks ago although had gait instability.  Vitals:   03/20/16 0604 03/20/16 1354  BP: 135/76 124/73  Pulse: 70 78  Resp: 15 16  Temp: 98.8 F (37.1 C) 98.3 F (36.8 C)   Elderly woman in NAD H irr irr LCTAB anteriorly ABD decreased BS Ext no edema Photos of wounds from admission reviewed.  Assessment and Plan: I have seen and evaluated the patient as outlined above. I agree with the formulated Assessment and Plan as detailed in the residents' note, with the following changes:   1. Wet gangrene of R foot with calcaneous and R MCP - Ms Acerra is an 82 yo woman who was admitted and tx for osteomyelitis and MSSA bacteremia in June with 6 weeks IV ABX. She has PAD and had angioplasty of the R post tib artery in June 2017. She presents with signs and sxs of infected R foot wounds and likely progression of her osteo. Dr Lajoyce Corners rec amputation. We consulted vascular to ensure she has adequate blood flow to heal the amputation site. She is for amputation in AM  2. Pre - op clearance - per revised Lee's criteria, her perioperative cardiac risk is 1% and the risks of proceeding with amputation are justified. She will be cont on her BB.  Metformin and spironolactone and ACE are being held pre-op. CBG's will be managed by insulin SSI. Although she has h/o HF, she is euvolemic and will get maintenance fluids as NPO.  3. Chronic A fib - hold Xarelto tonight  Burns Spain, MD 8/3/20173:07 PM

## 2016-03-20 NOTE — Progress Notes (Signed)
Subjective: [name] continues to have pain in her right foot this morning. She ate well last night and denies new symptoms of fever or chills. When questioned, she has a good understanding of the reasons for requiring a foot amputation. Objective: Vital signs in last 24 hours: Vitals:   03/19/16 1321 03/19/16 2316 03/20/16 0604  BP: (!) 128/59 128/79 135/76  Pulse: 79 72 70  Resp: _0 Temp: 99.1 F (37.3 C) 99.3 F (37.4 C) 98.8 F (37.1 C)  TempSrc: Oral Oral   SpO2: 96% 100% 99%  Weight: 62 kg (136 lb 11.2 oz)    Height: _1  (1.702 m)     Weight change:   Intake/Output Summary (Last 24 hours) at 03/20/16 0837 Last data filed at 03/20/16 0749  Gross per 24 hour  Intake              358 ml  Output              400 ml  Net              -42 ml   BP 124/73 (BP Location: Left Arm)   Pulse 78   Temp 98.3 F (36.8 C) (Oral)   Resp 16   Ht _2  (1.702 m)   Wt 62 kg (136 lb 11.2 oz)   SpO2 98%   BMI 21.41 kg/m   General appearance: alert, cooperative and no distress Head: Normocephalic, without obvious abnormality, atraumatic Lungs: clear to auscultation bilaterally Heart: irregularly irregular rhythm, normal rate. III/VI holosystolic ejection murmur at LLSB. No click, rub or gallop. Abdomen: soft, non-tender; bowel sounds normal; no masses,  no organomegaly. Small umbilical hernia present. Extremities: Right foot: large calcaneal ulcer (measurements in ID office 5x5x1.5cm deep). Shallow (3x2.5cm) ulcer medial aspect of foot near 1st MTH. Small ulcer on distal hallux. No surrounding erythema or warmth. Pulses 1+ DP, PT bilaterally. Neurologic: Grossly normal, alert and oriented x2  Lab Results: Component     Latest Ref Rng & Units 03/19/2016 03/20/2016 03/20/2016          8:06 AM 12:30 PM  WBC     4.0 - 10.5 K/uL 4.4    RBC     3.87 - 5.11 MIL/uL 3.45 (L)    Hemoglobin     12.0 - 15.0 g/dL 10.1 (L)    HCT     36.0 - 46.0 % 33.0 (L)    MCV     78.0 - 100.0 fL  95.7    MCH     26.0 - 34.0 pg 29.3    MCHC     30.0 - 36.0 g/dL 30.6    RDW     11.5 - 15.5 % 14.8    Platelets     150 - 400 K/uL 249    Neutrophils     % 56    NEUT#     1.7 - 7.7 K/uL 2.5    Lymphocytes     % 25    Lymphocyte #     0.7 - 4.0 K/uL 1.1    Monocytes Relative     % 15    Monocyte #     0.1 - 1.0 K/uL 0.7    Eosinophil     % 3    Eosinophils Absolute     0.0 - 0.7 K/uL 0.1    Basophil     % 1    Basophils Absolute     0.0 -  0.1 K/uL 0.0    Sodium     135 - 145 mmol/L 139    Potassium     3.5 - 5.1 mmol/L 3.8    Chloride     101 - 111 mmol/L 105    CO2     22 - 32 mmol/L 28    Glucose     65 - 99 mg/dL 88    BUN     6 - 20 mg/dL 12    Creatinine     0.44 - 1.00 mg/dL 0.65    Calcium     8.9 - 10.3 mg/dL 8.4 (L)    Total Protein     6.5 - 8.1 g/dL 6.6    Albumin     3.5 - 5.0 g/dL 2.9 (L)    AST     15 - 41 U/L 14 (L)    ALT     14 - 54 U/L 5 (L)    Alkaline Phosphatase     38 - 126 U/L 111    Total Bilirubin     0.3 - 1.2 mg/dL 1.3 (H)    EGFR (Non-African Amer.)     >60 mL/min >60    EGFR (African American)     >60 mL/min >60    Anion gap     5 - 15 6    Glucose-Capillary     65 - 99 mg/dL 108 (H) 96 91   Micro Results: Recent Results (from the past 240 hour(s))  Blood Cultures x 2 sites     Status: None (Preliminary result)   Collection Time: 03/19/16  3:15 PM  Result Value Ref Range Status   Specimen Description BLOOD LEFT ANTECUBITAL  Final   Special Requests BOTTLES DRAWN AEROBIC AND ANAEROBIC  5CC  Final   Culture PENDING  Incomplete   Report Status PENDING  Incomplete  Blood Cultures x 2 sites     Status: None (Preliminary result)   Collection Time: 03/19/16  3:30 PM  Result Value Ref Range Status   Specimen Description BLOOD RIGHT ANTECUBITAL  Final   Special Requests BOTTLES DRAWN AEROBIC AND ANAEROBIC  5CC  Final   Culture PENDING  Incomplete   Report Status PENDING  Incomplete   Studies/Results: No results  found. Medications: Scheduled Meds: . brimonidine  1 drop Left Eye Q12H  . dorzolamide  1 drop Left Eye BID  . ferrous sulfate  325 mg Oral Q breakfast  . furosemide  40 mg Oral Daily  . insulin aspart  0-15 Units Subcutaneous TID WC  . metoprolol succinate  100 mg Oral Daily  . mometasone-formoterol  2 puff Inhalation BID  . rivaroxaban  20 mg Oral Q supper  . senna  1 tablet Oral BID  . spironolactone  25 mg Oral Daily   Continuous Infusions:  PRN Meds:.albuterol, sodium chloride flush, traMADol    Assessment/Plan: Principal Problem:   Right foot ulcer (HCC) Active Problems:   Essential hypertension   Chronic combined systolic and diastolic congestive heart failure (HCC)   Chronic atrial fibrillation (HCC)   Pulmonary hypertension (HCC)   PAD (peripheral artery disease) (HCC)   Severe protein-calorie malnutrition (HCC)   Osteomyelitis (HCC)  Necrotic Ulcer of right foot: [name] remains hemodynamically stable despite having a life-threatening necrotic wound. Likely mixed picture peripheral vascular disease and long-standing diabetes. No improvement on 6 wks iv cefazolin, likely due to osteomyelitis and poor wound healing. Ortho has patient scheduled for transtibial amputation tomorrow afternoon.  Vascular surgery also consulted and recommending BKA vs AKA. Will discuss these options with the consult services and team to come to an agreement. Wound care saw patient this morning and will not follow as patient scheduled for surgery.                          --blood cx x2 pending   --Vascular surgery and Ortho consulted, appreciate their recs  TIIDM: on metformin. HbA1c 7.9 on 01/27/2016.                       --hold metformin                       --SSI with meals  Chronic Atrial fibrillation: continue home xarelto 55m and metoprolol 100 mg.  Chronic biventricular heart failure: Echo 01/29/2016 shows EF 25-30% with severe Tr, severe RV dysfxn. She takes spironolactone 25 mg  qday, lisinopril 231mqday, and metoprolol 10054mday at home.                        --hold ACE-I and spironolactone            --continue metoprolol for rate control   Peripheral artery disease: Likely contributing to poor wound healing. Arteriogram 01/30/2016 revealed occlusion of all 3 tibial vessels in RLE and mild stenosis of distal superficial femoral and proximal popliteal arteries in LLE. Angioplasty of entire posterior tibial artery was performed with good return of flow post-procedure.   FEN: Regular diet with SSI. NS 75 mL/hr starting at 12:45.    --NPO at midnight for surgery tomorrow                          --hold home zantac 73m7mVT ppx: Continue home xarelto 20 mg  Code Status: DNR  Dispo: Patient lives alone with assistance from daughter and granddaughter who live nearby. She will likely need assisted living after surgery. Anticipate stay of at least one week.  This is a [employer] information officere.  The care of the patient was discussed with Dr. PatePosey Pronto the assessment and plan formulated with their assistance.  Please see their attached note for official documentation of the daily encounter.   LOS: 1 day   [name] 03/20/2016, 8:37 AM

## 2016-03-20 NOTE — Consult Note (Signed)
[name] NOTE   MRN : [id number]  Reason for Consult: right heel ulcer Referring Physician: Dr. Lajoyce Corners  History of Present Illness: 82 y/o female who is admitted with worsening right heel ulcer.  The heel ulcer is full thickness with osteomyelitis in the calcaneous.  Medial ulcer at the first metatarsal head area full thickness ulcer.  Patient states she has been going to the wound Center for wound treatment.  She has been seen by our practice in the past and underwent aortogram by Dr. Myra Gianotti on 01/30/2016.   Ultrasound findings:   IMPRESSION: 1. Diffuse plaque throughout the right lower extremity arteries. 2. 30-49% stenosis in the distal femoral artery.   Monophasic wave forms all three tibial branches.  Findings: Right Lower Extremity:  Right common femoral profunda femoris superficial femoral artery are widely patent.  The popliteal artery is patent throughout its course.  The patient has occlusion of all 3 tibial vessels.  There is reconstitution of a peroneal and posterior tibial artery at the ankle.   Impression:                      #1  successful recanalization of an occluded posterior tibial artery and subsequent balloon angioplasty using a 3 mm balloon                      #2  residual stenosis about onto the foot and at the level of the ankle.  This was unable to be crossed.                      #3  single vessel runoff on the right leg via a very diseased anterior tibial artery  Past medical history includes:  HTN, combined CHF, chronic atrial fibrillation, pulmonary HTN, churg-strauss and PAD who presents for follow up for right foot osteomyelitis.She was hospitalized from June 11-18 th for MSSA bacteremia 2/2 infected diabetic wound ulcer on her right foot that included 1st MT head, lateral ulcer as well as eschar to her heal. During hospitalization, patient underwent an MRI of her right foot which showed early osteomyelitis of the dorsal  medial head of the first metatarsal.  MRI findings for early osteo, she was discharged on IV Cefazolin through 7/23 through PICC as well as follow up with wound care weekly.    Current Facility-Administered Medications  Medication Dose Route Frequency Provider Last Rate Last Dose  . albuterol (PROVENTIL) (2.5 MG/3ML) 0.083% nebulizer solution 2.5 mg  2.5 mg Nebulization Q6H PRN Burns Spain, MD      . brimonidine (ALPHAGAN) 0.15 % ophthalmic solution 1 drop  1 drop Left Eye Q12H Beather Arbour, MD   1 drop at 03/20/16 1006  . dorzolamide (TRUSOPT) 2 % ophthalmic solution 1 drop  1 drop Left Eye BID Beather Arbour, MD   1 drop at 03/20/16 1004  . ferrous sulfate tablet 325 mg  325 mg Oral Q breakfast Beather Arbour, MD   325 mg at 03/20/16 7829  . insulin aspart (novoLOG) injection 0-15 Units  0-15 Units Subcutaneous TID WC Beather Arbour, MD   2 Units at 03/19/16 1753  . metoprolol succinate (TOPROL-XL) 24 hr tablet 100 mg  100 mg Oral Daily Beather Arbour, MD   100 mg at 03/20/16 1004  . mometasone-formoterol (DULERA) 100-5 MCG/ACT inhaler 2 puff  2 puff Inhalation BID Beather Arbour, MD      .  rivaroxaban (XARELTO) tablet 20 mg  20 mg Oral Q supper Beather Arbour, MD      . senna (SENOKOT) tablet 8.6 mg  1 tablet Oral BID Beather Arbour, MD   8.6 mg at 03/20/16 1004  . sodium chloride flush (NS) 0.9 % injection 10-40 mL  10-40 mL Intracatheter PRN Burns Spain, MD      . traMADol Janean Sark) tablet 50 mg  50 mg Oral Q6H PRN Beather Arbour, MD   50 mg at 03/20/16 0634    Pt meds include: Statin :No Betablocker: No ASA: No Other anticoagulants/antiplatelets: xarelto  Past Medical History:  Diagnosis Date  . Asthma   . Atrial fibrillation, permanent (HCC)    a. Dx 04/2013, on metoprolol, dig and xarelto  . Cataract    left eye  . CHF (congestive heart failure) (HCC)   . Chronic combined systolic and diastolic heart failure (HCC)    a. 02/2012 EF:20-25%  . Churg-Strauss  syndrome (HCC)    a. Sural nerve biopsy 02/2000.  . Ectopic atrial tachycardia (HCC)    a. Documented on tele as IP 02/2012.  . Glaucoma   . Grave's disease    a. s/p thyroidectomy.  . Hypercholesteremia   . Hypertension   . Hypokalemia    a. Previously felt due to diuretics, req supplementation.  . Lipoma    left inner thigh  . Multifocal atrial tachycardia (HCC)    a. Documented as OP 01/2012.  Marland Kitchen NSVT (nonsustained ventricular tachycardia) (HCC)    a. During CHF adm 2001.  . On home oxygen therapy    "wear it mostly at night" (09/20/2014)  . Osteoarthritis    left shoulder  . Osteopenia    DEXA 2004  . Pericardial effusion    a. Echo 02/2012: small-mod pericardial effusion.  . Pleural effusion, left    a. Thoracentesis 2001 - per notes, no malignant cells, was transudative.  . Pneumonia 1930's  . Pseudogout   . Pulmonary HTN (HCC)    a. Mod by echo 02/2012.  Marland Kitchen Shortness of breath   . Type II diabetes mellitus (HCC)   . Valvular heart disease    a. Echo 02/2012: mod MR/TR, mild AI.  Marland Kitchen VASCULITIS   . Venous insufficiency     Past Surgical History:  Procedure Laterality Date  . ABDOMINAL HYSTERECTOMY    . CATARACT EXTRACTION Right   . FRACTURE SURGERY    . HEMIARTHROPLASTY HIP Right 10/2010   cemented for hip fracture, femoral neck - by Eulas Post, MD  . PERIPHERAL VASCULAR CATHETERIZATION N/A 01/30/2016   Procedure: Abdominal Aortogram w/Lower Extremity;  Surgeon: Nada Libman, MD;  Location: [facility];  Service: Cardiovascular;  Laterality: N/A;  . PERIPHERAL VASCULAR CATHETERIZATION Right 01/30/2016   Procedure: Peripheral Vascular Balloon Angioplasty;  Surgeon: Nada Libman, MD;  Location: [facility]VAS[facility] CV [facility];  Service: Cardiovascular;  Laterality: Right;  Posterior tibial  . SALPINGOOPHORECTOMY    . SURAL NERVE BIOPSY  02/2000  . THYROIDECTOMY      Social History Social History  Substance Use Topics  . Smoking status: Never Smoker  . Smokeless  tobacco: Never Used  . Alcohol use No    Family History Family History  Problem Relation Age of Onset  . Diabetes Mother   . Diabetes Father   . Heart disease Neg Hx     Allergies  Allergen Reactions  . Amiodarone Other (See Comments)    Contributed to  hypoxia     REVIEW OF SYSTEMS  General: [ ]  Weight loss, [ ]  Fever, [ ]  chills Neurologic: [ ]  Dizziness, [ ]  Blackouts, [ ]  Seizure [ ]  Stroke, [ ]  "Mini stroke", [ ]  Slurred speech, [ ]  Temporary blindness; [ ]  weakness in arms or legs, [ ]  Hoarseness [ ]  Dysphagia Cardiac: [ ]  Chest pain/pressure, [ ]  Shortness of breath at rest [ ]  Shortness of breath with exertion, [x ] Atrial fibrillation or irregular heartbeat  Vascular: [ ]  Pain in legs with walking, [ ]  Pain in legs at rest, [ ]  Pain in legs at night,  [ ]  Non-healing ulcer, [ ]  Blood clot in vein/DVT,   Pulmonary: [ ]  Home oxygen, [ ]  Productive cough, [ ]  Coughing up blood, [ ]  Asthma,  [ ]  Wheezing [ ]  COPD Musculoskeletal:  [x ] Arthritis, [ ]  Low back pain, [ ]  Joint pain Hematologic: [ ]  Easy Bruising, [ ]  Anemia; [ ]  Hepatitis Gastrointestinal: [ ]  Blood in stool, [ ]  Gastroesophageal Reflux/heartburn, Urinary: [ ]  chronic Kidney disease, [ ]  on HD - [ ]  MWF or [ ]  TTHS, [ ]  Burning with urination, [ ]  Difficulty urinating Skin: [ ]  Rashes, [x ] Wounds Psychological: [ ]  Anxiety, [ ]  Depression  Physical Examination Vitals:   03/19/16 1321 03/19/16 2316 03/20/16 0604  BP: (!) 128/59 128/79 135/76  Pulse: 79 72 70  Resp: 18 16 15   Temp: 99.1 F (37.3 C) 99.3 F (37.4 C) 98.8 F (37.1 C)  TempSrc: Oral Oral   SpO2: 96% 100% 99%  Weight: 136 lb 11.2 oz (62 kg)    Height: 5\' 7"  (1.702 m)     Body mass index is 21.41 kg/m.  General:  WDWN in NAD Gait: Normal HENT: WNL Eyes: Pupils equal Pulmonary: normal non-labored breathing , without Rales, rhonchi,  wheezing Cardiac: RRR, without  Murmurs, rubs or gallops; No carotid bruits Abdomen: soft, NT,  no masses Skin: Right foot malodor with The heel ulcer is full thickness with osteomyelitis in the calcaneous.  Medial ulcer at the first metatarsal head area full thickness ulcer.   Vascular Exam/Pulses:palpable radial and femoral pulses, fait right DP doppler, left doppler signals PT/DP   Musculoskeletal: no muscle wasting or atrophy; no edema  Neurologic: A&O X 3; Appropriate Affect ;  SENSATION: normal; MOTOR FUNCTION: 5/5 Symmetric Speech is fluent/normal   Significant Diagnostic Studies: CBC Lab Results  Component Value Date   WBC 4.4 03/19/2016   HGB 10.1 (L) 03/19/2016   HCT 33.0 (L) 03/19/2016   MCV 95.7 03/19/2016   PLT 249 03/19/2016    BMET    Component Value Date/Time   NA 139 03/19/2016 1514   K 3.8 03/19/2016 1514   CL 105 03/19/2016 1514   CO2 28 03/19/2016 1514   GLUCOSE 88 03/19/2016 1514   BUN 12 03/19/2016 1514   CREATININE 0.65 03/19/2016 1514   CREATININE 0.66 11/01/2014 1605   CALCIUM 8.4 (L) 03/19/2016 1514   GFRNONAA >60 03/19/2016 1514   GFRNONAA 80 11/01/2014 1605   GFRAA >60 03/19/2016 1514   GFRAA >89 11/01/2014 1605   Estimated Creatinine Clearance: 47.3 mL/min (by C-G formula based on SCr of 0.8 mg/dL).  COAG Lab Results  Component Value Date   INR 1.22 08/18/2013   INR 1.34 07/02/2013   INR 1.38 06/29/2013     Non-Invasive Vascular Imaging: no new studies see HPI for recent studies.  ASSESSMENT/PLAN:  Single vessel runoff right LE  post angiogram with Angioplasty, right posterior tibial artery Osteomyolitis heel and first metatarsal head right foot. She will need right LE amputation BKB verses AKA.   Clinton Gallant [facility] 03/20/2016 11:10 AM   I  Agree with the above.  I have seen and evaluated the patient.The patient has developed worsening of her right leg wound, following revascularization.  I agree with Dr Lajoyce Corners in that we should proceed with right BKA.   Durene Cal

## 2016-03-20 NOTE — Progress Notes (Signed)
   Subjective:  This morning, she had no complaints. She continues to acknowledge some right foot pain. She reports she will undergo surgery at some point.   Objective:  Vital signs in last 24 hours: Vitals:   03/19/16 1321 03/19/16 2316 03/20/16 0604 03/20/16 1354  BP: (!) 128/59 128/79 135/76 124/73  Pulse: 79 72 70 78  Resp: 18 16 15 16   Temp: 99.1 F (37.3 C) 99.3 F (37.4 C) 98.8 F (37.1 C) 98.3 F (36.8 C)  TempSrc: Oral Oral  Oral  SpO2: 96% 100% 99% 98%  Weight: 136 lb 11.2 oz (62 kg)     Height: 5\' 7"  (1.702 m)      General: elderly African American female, resting in bed HEENT: EOMI, no scleral icterus Cardiac: irregular rhythm, ?LUSB murmur Pulm: clear to auscultation bilaterally, no wheezes, rales, or rhonchi Ext: LLE covered in bandage; did not inspect [see attending's physical exam] Neuro: responds to questions appropriately; moving all extremities freely, oriented to name and place but not year  Assessment/Plan:  Principal Problem:   Right foot ulcer (HCC) Active Problems:   Essential hypertension   Chronic combined systolic and diastolic congestive heart failure (HCC)   Chronic atrial fibrillation (HCC)   Severe protein-calorie malnutrition (HCC)   Pulmonary hypertension (HCC)   PAD (peripheral artery disease) (HCC)   Osteomyelitis (HCC)  [name] is an 82 year old female with non-insulin-dependent type 2 diabetes, peripheral vascular disease s/p angioplasty, hypertension, combined systolic and diastolic CHF [EF 03-49%, 01/29/16], chronic atrial fibrillation on anticoagulation, pulmonary hypertension, Churg-Strauss vasculitis hospitalized for right foot ulcers with life-threatening necrosis and likely osteomyelitis.  Right foot ulcers with life-threatening necrosis: Possibly multifactorial in the setting of her diabetes and vascular disease which has contributed to poor healing. Bone exposed which makes [place] suspect deeper involvement despite six weeks of  IV antibiotics and outpatient wound care. Orthopedics recommending transtibial amputation but concern for vascular supply and further difficulties with healing. -Consult vascular surgery to reassess vascular supply -Follow blood cultures -Consider IV broad-spectrum antibiotics if she shows signs of sepsis given the risk of C.dif colitis from her recent healthcare exposures and six-week course of antibiotics  Non-insulin-dependent type 2 diabetes: A1c 7.9, 01/27/16. Her medications include metformin 1000 mg twice daily.. -Start sensitive sliding scale insulin with meals -Give regular diet and keep NPO midnight for procedure tomorrow  Chronic combined systolic and diastolic congestive heart failure: Echo 01/29/16 with EF 25-30% with severe tricuspid regurgitation, severe RV dysfunction, diffuse hypokinesis, mild aortic regurgitation, moderate mitral regurgitation, severe left atrial dilatation, moderate RA dilatation. Home medications include spironolactone 25 mg daily, lisinopril 20 mg daily, metoprolol succinate 100 mg daily NOT Lasix per daughter. -Continue home meds EXCEPT spironolactone to avoid acute kidney injury in the setting of less oral intake  Peripheral artery disease status post angioplasty: Continue atorvastatin 40 mg  Chronic atrial fibrillation on anticoagulation: Continue rivoroxaban 15 mg with dinner [dose adjusted for renal clearance].  #FEN:  -Diet: Regular  #DVT prophylaxis: Rivaroxaban  #CODE STATUS: DNR/DNI -Defer to [name] [daughter] if patients lacks decision-making capacity -Confirmed with patient on admission  Dispo: Anticipated discharge in approximately 3-4 day(s).   LOS: 1 day   Wendy Arbour, MD 03/20/2016, 2:12 PM Pager: @[phone] @

## 2016-03-20 NOTE — Progress Notes (Signed)
Initial Nutrition Assessment  DOCUMENTATION CODES:   Not applicable  INTERVENTION:   -MVI daily  NUTRITION DIAGNOSIS:   Increased nutrient needs related to wound healing as evidenced by estimated needs.  GOAL:   Patient will meet greater than or equal to 90% of their needs  MONITOR:   Supplement acceptance, PO intake, Labs, Skin, I & O's  REASON FOR ASSESSMENT:   Consult Wound healing  ASSESSMENT:   Ms. Curtiss is an 82yo F with a PMH of TIIDM, PAD, HTN, combined CHF, chronic Atrial fibrillation on anticoagulation, pulmonary HTN, and Churg-Strauss who presents with worsening drainage and pain in right foot. She was hospitalized June 11-18th for MSSA bacteremia 2/2 an infected diabetic wound ulcer on her right foot that included 1st MT head, lateral ulcer, and calcaneal eschar. MRI of the right foot showed early signs of osteomyelitis in the dorsal medial head of the first metatarsal. Vascular surgery was consulted for her nonhealing wound and decreased ABI's, and Ms. Broadus John underwent angioplasty of the right posterior tibial artery. With early signs of osteo on MRI, she was discharged on 6 weeks iv cefazolin which she completed on 7/23. She has also seen wound care weekly for dressing changes and debridement.   Pt admitted with rt foot ulcer.   Hx obtained from pt and pt son, Gabriel Rung, at bedside. Pt son reports pt with good appetite PTA; pt typically consumes 3 meals per day, prepared by pt daughter and granddaughter, who live with her. Pt reports she always eats a protein source with each meal, whether it be chicken, beef, fish, or beans.   Pt denies wt loss over the past 6 months, which is consistent with wt hx. Per pt son, pt has experienced some intentional wt loss in the past ("her weight was too high").   Noted last Hgb A1c: 7.9 (01/27/16). Home DM medication regimen Metformin 1,000 mg BID. Pt reports compliance with self-management and medications. She estimates home CBGS are  usually in the 150 range.   Per pt son, home health services and pt daughter were managing dressing changes at home. Pt was not taking protein supplements at home, but was taking MVI and is amenable to continue during hospitalization.   Review ortho notes; recommending rt BKA for limb salvage.   Discussed importance of good PO intake (especially protein foods) to assist with healing. Reviewed basic DM diet and self-management principles and importance of adequate glycemic control, especially related to wound healing.  Case discussed with RN.   Labs reviewed: CBGS: 96-140.   Diet Order:  Diet regular Room service appropriate? Yes; Fluid consistency: Thin Diet NPO time specified Except for: Sips with Meds Diet NPO time specified  Skin:  Wound (see comment) (necrotic DM ulcer rt foot)  Last BM:  PTA  Height:   Ht Readings from Last 1 Encounters:  03/19/16 5\' 7"  (1.702 m)    Weight:   Wt Readings from Last 1 Encounters:  03/19/16 136 lb 11.2 oz (62 kg)    Ideal Body Weight:  61.3 kg  BMI:  Body mass index is 21.41 kg/m.  Estimated Nutritional Needs:   Kcal:  1650-1850  Protein:  80-95 grams  Fluid:  >1.6 L  EDUCATION NEEDS:   Education needs addressed  Kalib Bhagat A. Mayford Knife, RD, LDN, CDE Pager: [phone] After hours Pager: [phone]

## 2016-03-20 NOTE — Progress Notes (Signed)
Patient ID: [name], female   DOB: 1925-12-01, 82 y.o.   MRN: [id number] MRI canceled due to the extensive necrotic soft tissue envelope.

## 2016-03-20 NOTE — Consult Note (Signed)
WOC Nurse wound consult note WOC Nurse consult placed simultaneously with Orthopedic MD consult.  Noted is Dr. Audrie Lia assessment and findings, also plan for transtibial amputation today.  WOC Nurse did not see and will not follow. WOC nursing team will remain available to this patient, the nursing and medical teams.  Please re-consult if needed. Thanks, Ladona Mow, MSN, RN, GNP, [place]  Pager# [phone]

## 2016-03-20 NOTE — Plan of Care (Signed)
Problem: Physical Regulation: Goal: Will remain free from infection Outcome: Not Progressing Right heel infected, will have surgery tomorrow  Problem: Nutrition: Goal: Adequate nutrition will be maintained Outcome: Progressing Pt now on a regular diet as ordered

## 2016-03-21 ENCOUNTER — Inpatient Hospital Stay (HOSPITAL_COMMUNITY): Payer: Medicare Other | Admitting: Certified Registered Nurse Anesthetist

## 2016-03-21 ENCOUNTER — Encounter (HOSPITAL_COMMUNITY)
Admission: AD | Disposition: A | Discharge status: Home Health | Payer: Self-pay | Source: Ambulatory Visit | Attending: Internal Medicine

## 2016-03-21 ENCOUNTER — Encounter (HOSPITAL_COMMUNITY): Payer: Self-pay | Admitting: Certified Registered Nurse Anesthetist

## 2016-03-21 HISTORY — PX: BELOW KNEE LEG AMPUTATION: SUR23

## 2016-03-21 HISTORY — PX: AMPUTATION: SHX166

## 2016-03-21 LAB — BASIC METABOLIC PANEL
Anion gap: 7 (ref 5–15)
BUN: 12 mg/dL (ref 6–20)
CO2: 29 mmol/L (ref 22–32)
CREATININE: 0.79 mg/dL (ref 0.44–1.00)
Calcium: 8.3 mg/dL — ABNORMAL LOW (ref 8.9–10.3)
Chloride: 101 mmol/L (ref 101–111)
GFR calc non Af Amer: >60 mL/min (ref >60)
Glucose, Bld: 116 mg/dL — ABNORMAL HIGH (ref 65–99)
Potassium: 4 mmol/L (ref 3.5–5.1)
SODIUM: 137 mmol/L (ref 135–145)

## 2016-03-21 LAB — GLUCOSE, CAPILLARY
GLUCOSE-CAPILLARY: 106 mg/dL — AB (ref 65–99)
GLUCOSE-CAPILLARY: 110 mg/dL — AB (ref 65–99)
Glucose-Capillary: 105 mg/dL — ABNORMAL HIGH (ref 65–99)
Glucose-Capillary: 83 mg/dL (ref 65–99)
Glucose-Capillary: 168 mg/dL — ABNORMAL HIGH (ref 65–99)

## 2016-03-21 SURGERY — AMPUTATION BELOW KNEE
Anesthesia: General | Laterality: Right

## 2016-03-21 MED ORDER — METHOCARBAMOL 1000 MG/10ML IJ SOLN
500.0000 mg | Freq: Four times a day (QID) | INTRAVENOUS | Status: DC | PRN
  Filled 2016-03-21: qty 5

## 2016-03-21 MED ORDER — FENTANYL CITRATE (PF) 250 MCG/5ML IJ SOLN
INTRAMUSCULAR | Status: AC
  Filled 2016-03-21: qty 5

## 2016-03-21 MED ORDER — ACETAMINOPHEN 325 MG PO TABS
650.0000 mg | ORAL_TABLET | Freq: Four times a day (QID) | ORAL | Status: DC | PRN
  Administered 2016-03-22: 650 mg via ORAL
  Filled 2016-03-21: qty 2

## 2016-03-21 MED ORDER — METOCLOPRAMIDE HCL 5 MG/ML IJ SOLN: 5.0000 mg | Freq: Three times a day (TID) | INTRAMUSCULAR | Status: DC | PRN

## 2016-03-21 MED ORDER — FENTANYL CITRATE (PF) 100 MCG/2ML IJ SOLN
INTRAMUSCULAR | Status: AC
  Filled 2016-03-21: qty 2

## 2016-03-21 MED ORDER — LACTATED RINGERS IV SOLN
INTRAVENOUS | Status: DC
  Administered 2016-03-21: 15:00:00 via INTRAVENOUS

## 2016-03-21 MED ORDER — LIDOCAINE HCL (CARDIAC) 20 MG/ML IV SOLN
INTRAVENOUS | Status: DC | PRN
  Administered 2016-03-21: 50 mg via INTRAVENOUS

## 2016-03-21 MED ORDER — ESMOLOL HCL 100 MG/10ML IV SOLN
INTRAVENOUS | Status: DC | PRN
  Administered 2016-03-21 (×2): 20 mg via INTRAVENOUS

## 2016-03-21 MED ORDER — SODIUM CHLORIDE 0.9 % IV SOLN
INTRAVENOUS | Status: DC
  Administered 2016-03-23: 11:00:00 via INTRAVENOUS

## 2016-03-21 MED ORDER — PROPOFOL 10 MG/ML IV BOLUS
INTRAVENOUS | Status: AC
  Filled 2016-03-21: qty 20

## 2016-03-21 MED ORDER — FENTANYL CITRATE (PF) 100 MCG/2ML IJ SOLN
INTRAMUSCULAR | Status: DC | PRN
  Administered 2016-03-21: 50 ug via INTRAVENOUS

## 2016-03-21 MED ORDER — PROMETHAZINE HCL 25 MG/ML IJ SOLN: 6.2500 mg | INTRAMUSCULAR | Status: DC | PRN

## 2016-03-21 MED ORDER — OXYCODONE HCL 5 MG PO TABS
5.0000 mg | ORAL_TABLET | ORAL | Status: DC | PRN
  Administered 2016-03-21 (×2): 10 mg via ORAL
  Filled 2016-03-21 (×2): qty 2

## 2016-03-21 MED ORDER — ONDANSETRON HCL 4 MG PO TABS: 4.0000 mg | ORAL_TABLET | Freq: Four times a day (QID) | ORAL | Status: DC | PRN

## 2016-03-21 MED ORDER — METOCLOPRAMIDE HCL 5 MG PO TABS: 5.0000 mg | ORAL_TABLET | Freq: Three times a day (TID) | ORAL | Status: DC | PRN

## 2016-03-21 MED ORDER — PROPOFOL 10 MG/ML IV BOLUS
INTRAVENOUS | Status: DC | PRN
  Administered 2016-03-21 (×2): 100 mg via INTRAVENOUS

## 2016-03-21 MED ORDER — METHOCARBAMOL 500 MG PO TABS
500.0000 mg | ORAL_TABLET | Freq: Four times a day (QID) | ORAL | Status: DC | PRN
  Administered 2016-03-21 – 2016-03-23 (×4): 500 mg via ORAL
  Filled 2016-03-21 (×5): qty 1

## 2016-03-21 MED ORDER — MORPHINE SULFATE (PF) 2 MG/ML IV SOLN
2.0000 mg | INTRAVENOUS | Status: DC | PRN
  Administered 2016-03-21: 2 mg via INTRAVENOUS
  Filled 2016-03-21 (×2): qty 1

## 2016-03-21 MED ORDER — ACETAMINOPHEN 650 MG RE SUPP: 650.0000 mg | Freq: Four times a day (QID) | RECTAL | Status: DC | PRN

## 2016-03-21 MED ORDER — SODIUM CHLORIDE 0.9 % IV SOLN: INTRAVENOUS | Status: AC

## 2016-03-21 MED ORDER — ONDANSETRON HCL 4 MG/2ML IJ SOLN: 4.0000 mg | Freq: Four times a day (QID) | INTRAMUSCULAR | Status: DC | PRN

## 2016-03-21 MED ORDER — PHENYLEPHRINE HCL 10 MG/ML IJ SOLN
INTRAMUSCULAR | Status: DC | PRN
  Administered 2016-03-21 (×2): 120 ug via INTRAVENOUS

## 2016-03-21 MED ORDER — 0.9 % SODIUM CHLORIDE (POUR BTL) OPTIME
TOPICAL | Status: DC | PRN
  Administered 2016-03-21: 1000 mL

## 2016-03-21 MED ORDER — FENTANYL CITRATE (PF) 100 MCG/2ML IJ SOLN
25.0000 ug | INTRAMUSCULAR | Status: DC | PRN
  Administered 2016-03-21 (×4): 25 ug via INTRAVENOUS

## 2016-03-21 SURGICAL SUPPLY — 36 items
BLADE SAW RECIP 87.9 MT (BLADE) ×3 IMPLANT
BLADE SURG 21 STRL SS (BLADE) ×3 IMPLANT
BNDG COHESIVE 6X5 TAN STRL LF (GAUZE/BANDAGES/DRESSINGS) ×6 IMPLANT
BNDG GAUZE ELAST 4 BULKY (GAUZE/BANDAGES/DRESSINGS) ×6 IMPLANT
COVER SURGICAL LIGHT HANDLE (MISCELLANEOUS) ×6 IMPLANT
CUFF TOURNIQUET SINGLE 34IN LL (TOURNIQUET CUFF) IMPLANT
CUFF TOURNIQUET SINGLE 44IN (TOURNIQUET CUFF) IMPLANT
DRAPE EXTREMITY T 121X128X90 (DRAPE) ×3 IMPLANT
DRAPE INCISE IOBAN 66X45 STRL (DRAPES) IMPLANT
DRAPE PROXIMA HALF (DRAPES) ×3 IMPLANT
DRAPE U-SHAPE 47X51 STRL (DRAPES) ×3 IMPLANT
DRSG ADAPTIC 3X8 NADH LF (GAUZE/BANDAGES/DRESSINGS) ×3 IMPLANT
DRSG PAD ABDOMINAL 8X10 ST (GAUZE/BANDAGES/DRESSINGS) ×3 IMPLANT
DRSG VAC ATS LRG SENSATRAC (GAUZE/BANDAGES/DRESSINGS) ×2 IMPLANT
DURAPREP 26ML APPLICATOR (WOUND CARE) ×3 IMPLANT
ELECT REM PT RETURN 9FT ADLT (ELECTROSURGICAL) ×3
ELECTRODE REM PT RTRN 9FT ADLT (ELECTROSURGICAL) ×1 IMPLANT
GAUZE SPONGE 4X4 12PLY STRL (GAUZE/BANDAGES/DRESSINGS) ×3 IMPLANT
GLOVE BIOGEL PI IND STRL 9 (GLOVE) ×1 IMPLANT
GLOVE BIOGEL PI INDICATOR 9 (GLOVE) ×2
GLOVE SURG ORTHO 9.0 STRL STRW (GLOVE) ×3 IMPLANT
GOWN STRL REUS W/ TWL XL LVL3 (GOWN DISPOSABLE) ×2 IMPLANT
GOWN STRL REUS W/TWL XL LVL3 (GOWN DISPOSABLE) ×6
KIT BASIN OR (CUSTOM PROCEDURE TRAY) ×3 IMPLANT
KIT ROOM TURNOVER OR (KITS) ×3 IMPLANT
MANIFOLD NEPTUNE II (INSTRUMENTS) ×3 IMPLANT
NS IRRIG 1000ML POUR BTL (IV SOLUTION) ×3 IMPLANT
PACK GENERAL/GYN (CUSTOM PROCEDURE TRAY) ×3 IMPLANT
PAD ARMBOARD 7.5X6 YLW CONV (MISCELLANEOUS) ×3 IMPLANT
SPONGE LAP 18X18 X RAY DECT (DISPOSABLE) IMPLANT
STAPLER VISISTAT 35W (STAPLE) IMPLANT
STOCKINETTE IMPERVIOUS LG (DRAPES) ×3 IMPLANT
SUT SILK 2 0 (SUTURE) ×3
SUT SILK 2-0 18XBRD TIE 12 (SUTURE) ×1 IMPLANT
SUT VIC AB 1 CTX 27 (SUTURE) ×4 IMPLANT
TOWEL OR 17X26 10 PK STRL BLUE (TOWEL DISPOSABLE) ×3 IMPLANT

## 2016-03-21 NOTE — Progress Notes (Signed)
   Subjective:  This morning, she had no complaints. Her daughter Dec was at bedside. She denies no new complains, like fever/chills or worsening pain. She is aware of her surgery today, and we wished her well.  Objective:  Vital signs in last 24 hours: Vitals:   03/20/16 2128 03/21/16 0416 03/21/16 0808 03/21/16 1022  BP: 127/64 137/62  (!) 128/95  Pulse: 79 73  73  Resp: 17 16    Temp: 99.4 F (37.4 C) 99.3 F (37.4 C)    TempSrc: Oral Tympanic    SpO2: 98% 96% 96%   Weight:      Height:       General: elderly African American female, resting in bed HEENT: EOMI, no scleral icterus Cardiac: irregular rhythm, ?LUSB murmur Pulm: clear to auscultation bilaterally, no wheezes, rales, or rhonchi Ext: RLE covered in dressing, difficult to palpate pulse of L foot Neuro: responds to questions appropriately; moving all extremities freely, oriented to name and place but not year  Assessment/Plan:  Principal Problem:   Right foot ulcer (HCC) Active Problems:   Essential hypertension   Chronic combined systolic and diastolic congestive heart failure (HCC)   Chronic atrial fibrillation (HCC)   Severe protein-calorie malnutrition (HCC)   Pulmonary hypertension (HCC)   PAD (peripheral artery disease) (HCC)   Osteomyelitis (HCC)  Mrs. 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  Right foot ulcers with life-threatening necrosis: Possibly multifactorial in the setting of her diabetes and vascular disease which has contributed to poor healing. Bone exposed which makes [place] suspect deeper involvement despite six weeks of IV antibiotics and outpatient wound  care. Scheduled for transtibial amputation today. -Follow blood cultures -Follow-up post op recs  Non-insulin-dependent type 2 diabetes: A1c 7.9, 01/27/16. Her medications include metformin 1000 mg twice daily. -Start sensitive sliding scale insulin with meals -Keep NPO for procedure and resume regular diet thereafter -Give NS 75cc/hr x 12   Chronic combined systolic and diastolic congestive heart failure: Echo 01/29/16 with EF 25-30% with severe tricuspid regurgitation, severe RV dysfunction, diffuse hypokinesis, mild aortic regurgitation, moderate mitral regurgitation, severe left atrial dilatation, moderate RA dilatation. Home medications include spironolactone 25 mg daily, lisinopril 20 mg daily, metoprolol succinate 100 mg daily NOT Lasix per daughter. -Continue home meds EXCEPT spironolactone to avoid acute kidney injury in the setting of less oral intake  Peripheral artery disease status post angioplasty: Continue atorvastatin 40 mg  Chronic atrial fibrillation on anticoagulation: Continue rivoroxaban 15 mg with dinner [dose adjusted for renal clearance].  #FEN:  -Diet: Regular  #DVT prophylaxis: Rivaroxaban  #CODE STATUS: DNR/DNI -Defer to [name] [daughter] if patients lacks decision-making capacity -Confirmed with patient on admission  Dispo: Anticipated discharge in approximately 3-4 day(s).   LOS: 2 days   Beather Arbour, MD 03/21/2016, 12:54 PM Pager: @[email or web address] @

## 2016-03-21 NOTE — Progress Notes (Signed)
Subjective: [name] is feeling well this morning. States she is ready for surgery today and currently denies any pain or chills. Objective: Vital signs in last 24 hours: Vitals:   03/20/16 1354 03/20/16 2128 03/21/16 0416 03/21/16 0808  BP: 124/73 127/64 137/62   Pulse: 78 79 73   Resp: _0 Temp: 98.3 F (36.8 C) 99.4 F (37.4 C) 99.3 F (37.4 C)   TempSrc: Oral Oral Tympanic   SpO2: 98% 98% 96% 96%  Weight:      Height:       Weight change:   Intake/Output Summary (Last 24 hours) at 03/21/16 0828 Last data filed at 03/21/16 0388  Gross per 24 hour  Intake              240 ml  Output              700 ml  Net             -460 ml   BP (!) 128/95   Pulse 73   Temp 99.3 F (37.4 C) (Tympanic)   Resp 16   Ht 5' 7" (1.702 m)   Wt 62 kg (136 lb 11.2 oz)   SpO2 96%   BMI 21.41 kg/m   General appearance: alert, cooperative and no distress Head: Normocephalic, without obvious abnormality, atraumatic Lungs: clear to auscultation bilaterally Heart: irregularly irregular rhythm, normal rate. III/VI holosystolic ejection murmur at LLSB. Noclick, rub or gallop. Abdomen: soft, non-tender; bowel sounds normal; no masses, no organomegaly. Small umbilical hernia present. Extremities: Right foot bandaged, did not visualize. No erythema or warmth above or below level of dressing. Neurologic: Grossly normal, alert and oriented x2  Lab Results: Component     Latest Ref Rng & Units 03/21/2016 03/21/2016 03/21/2016         3:45 AM  7:47 AM 12:04 PM  Sodium     135 - 145 mmol/L 137    Potassium     3.5 - 5.1 mmol/L 4.0    Chloride     101 - 111 mmol/L 101    CO2     22 - 32 mmol/L 29    Glucose     65 - 99 mg/dL 116 (H)    BUN     6 - 20 mg/dL 12    Creatinine     0.44 - 1.00 mg/dL 0.79    Calcium     8.9 - 10.3 mg/dL 8.3 (L)    EGFR (Non-African Amer.)     >60 mL/min >60    EGFR (African American)     >60 mL/min >60    Anion gap     5 - 15 7     Glucose-Capillary     65 - 99 mg/dL  106 (H) 105 (H)   Micro Results: Recent Results (from the past 240 hour(s))  Blood Cultures x 2 sites     Status: None (Preliminary result)   Collection Time: 03/19/16  3:15 PM  Result Value Ref Range Status   Specimen Description BLOOD LEFT ANTECUBITAL  Final   Special Requests BOTTLES DRAWN AEROBIC AND ANAEROBIC  5CC  Final   Culture NO GROWTH < 24 HOURS  Final   Report Status PENDING  Incomplete  Blood Cultures x 2 sites     Status: None (Preliminary result)   Collection Time: 03/19/16  3:30 PM  Result Value Ref Range Status   Specimen Description BLOOD RIGHT ANTECUBITAL  Final  Special Requests BOTTLES DRAWN AEROBIC AND ANAEROBIC  5CC  Final   Culture NO GROWTH < 24 HOURS  Final   Report Status PENDING  Incomplete  Surgical pcr screen     Status: None   Collection Time: 03/20/16  5:15 PM  Result Value Ref Range Status   MRSA, PCR NEGATIVE NEGATIVE Final   Staphylococcus aureus NEGATIVE NEGATIVE Final    Comment:        The Xpert SA Assay (FDA approved for NASAL specimens in patients over 72 years of age), is one component of a comprehensive surveillance program.  Test performance has been validated by [facility] for patients greater than or equal to 58 year old. It is not intended to diagnose infection nor to guide or monitor treatment.    Studies/Results: No results found.   Medications:  Scheduled Meds: . brimonidine  1 drop Left Eye Q12H  .  ceFAZolin (ANCEF) IV  2 g Intravenous To SSTC  . dorzolamide  1 drop Left Eye BID  . ferrous sulfate  325 mg Oral Q breakfast  . insulin aspart  0-15 Units Subcutaneous TID WC  . metoprolol succinate  100 mg Oral Daily  . mometasone-formoterol  2 puff Inhalation BID  . multivitamin with minerals  1 tablet Oral Daily  . [START ON 03/22/2016] rivaroxaban  15 mg Oral Q supper  . senna  1 tablet Oral BID   Continuous Infusions:  PRN Meds:.albuterol, sodium chloride flush, traMADol    Assessment/Plan: Principal Problem:   Right foot ulcer (HCC) Active Problems:   Essential hypertension   Chronic combined systolic and diastolic congestive heart failure (HCC)   Chronic atrial fibrillation (HCC)   Pulmonary hypertension (HCC)   PAD (peripheral artery disease) (HCC)   Severe protein-calorie malnutrition ([place])   Osteomyelitis ([place])   Necrotic ulcer of right foot:[name] remains hemodynamically stable despite having a life-threatening necrotic wound. Likely mixed picture peripheral vascular disease and long-standing diabetes. No improvement on 6 wks iv cefazolin, likely due to osteomyelitis and poor wound healing. Ortho and vascular surgery consulted and in agreement that amputation is only option at this point. She is scheduled for BKA later this afternoon.   --blood cultures negative at 24 hrs                         --BKA today  TIIDM: on metformin. HbA1c 7.9 on 01/27/2016. --holding metformin --SSI with meals (currently NPO)  Chronic Atrial fibrillation: at home, takes xarelto 20 mg and metoprolol 100 mg. While in hospital, she is receiving 15 mg xarelto, adjusted for renal function.   --held xarelto last night and today (8/4) in setting of surgery. Will restart 8/5.  Chronic biventricular heart failure: Currently euvolemic. Echo 01/29/2016 shows EF 25-30% with severe Tr, severe RV dysfxn. She takes spironolactone 25 mg qday, lisinopril 74m qday, and metoprolol 1042mqday at home. Lasix was noted as a home med in patient's records but according to daughter, she is NOT supposed to be taking it. --hold home ACE-I and spironolactone                       --continue metoprolol for rate control in setting of A fib  Peripheral artery disease: Likely contributing to poor wound healing. Arteriogram 01/30/2016 revealed occlusion of all 3 tibial vessels in RLE and mild stenosis of  distal superficial femoral and proximal popliteal arteries in LLE. Angioplasty of entire posterior tibial artery  was performed with good return of flow post-procedure.   FEN: NPO for surgery. NS at 75 mL/hr restarted this morning.                          --restart regular diet following operation    --hold home zantac 75mg  DVT ppx:xarelto 15 mg (renal dose adjusted)   --hold xarelto in setting of surgery. Restart 8/5.  Code Status: DNR  Dispo: Patient lives alone with assistance from daughter and granddaughter who live nearby. She will likely need rehabilitation facility after surgery. Anticipate stay through early next week.  This is a Medical Student Note.  The care of the patient was discussed with Dr. Patel and the assessment and plan formulated with their assistance.  Please see their attached note for official documentation of the daily encounter.   LOS: 2 days   Taylor Fie, Medical Student 03/21/2016, 8:28 AM 

## 2016-03-21 NOTE — Progress Notes (Addendum)
Report called to short stay. 

## 2016-03-21 NOTE — Transfer of Care (Signed)
Immediate Anesthesia Transfer of Care Note  Patient: [name]  Procedure(s) Performed: Procedure(s): RIGHT BELOW KNEE AMPUTATION (Right)  Patient Location: PACU  Anesthesia Type:General  Level of Consciousness: awake and alert   Airway & Oxygen Therapy: Patient Spontanous Breathing and Patient connected to face mask oxygen  Post-op Assessment: Report given to RN and Post -op Vital signs reviewed and stable  Post vital signs: Reviewed and stable  Last Vitals:  Vitals:   03/21/16 1430 03/21/16 1644  BP: (!) 142/64 131/79  Pulse: 75 100  Resp: 17 15  Temp: 37.1 C 36.9 C    Last Pain:  Vitals:   03/21/16 1644  TempSrc:   PainSc: Asleep      Patients Stated Pain Goal: 2 (03/21/16 1022)  Complications: No apparent anesthesia complications

## 2016-03-21 NOTE — Op Note (Signed)
   Date of Surgery: 03/21/2016  INDICATIONS: Ms. Sollars is a 82 y.o.-year-old female who with severe peripheral vascular disease who has osteomyelitis and has insufficient circulation for foot salvage and presents at this time for transtibial amputation.Marland Kitchen  PREOPERATIVE DIAGNOSIS: Gangrene right foot  POSTOPERATIVE DIAGNOSIS: Same.  PROCEDURE: Transtibial amputation Application of Prevena wound VAC  SURGEON: Lajoyce Corners, M.D.  ANESTHESIA:  general  IV FLUIDS AND URINE: See anesthesia.  ESTIMATED BLOOD LOSS: Minimal mL.  COMPLICATIONS: None.  DESCRIPTION OF PROCEDURE: The patient was brought to the operating room and underwent a general anesthetic. After adequate levels of anesthesia were obtained patient's lower extremity was prepped using DuraPrep draped into a sterile field. A timeout was called. The foot was draped out of the sterile field with impervious stockinette. A transverse incision was made 11 cm distal to the tibial tubercle. This curved proximally and a large posterior flap was created. The tibia was transected 1 cm proximal to the skin incision. The fibula was transected just proximal to the tibial incision. The tibia was beveled anteriorly. A large posterior flap was created. The sciatic nerve was pulled cut and allowed to retract. The vascular bundles were suture ligated with 2-0 silk. The deep and superficial fascial layers were closed using #1 Vicryl. The skin was closed using staples and 2-0 nylon. The wound was covered with a Prevena wound VAC. A Vive Wear prosthetic shrinker was applied over the VAC There was a good suction fit. Patient was extubated taken to the PACU in stable condition.  Aldean Baker, MD [facility] 4:31 PM

## 2016-03-21 NOTE — Anesthesia Postprocedure Evaluation (Signed)
Anesthesia Post Note  Patient: [name]  Procedure(s) Performed: Procedure(s) (LRB): RIGHT BELOW KNEE AMPUTATION (Right)  Patient location during evaluation: PACU Anesthesia Type: General Level of consciousness: awake and alert Pain management: pain level controlled Vital Signs Assessment: post-procedure vital signs reviewed and stable Respiratory status: spontaneous breathing, nonlabored ventilation, respiratory function stable and patient connected to nasal cannula oxygen Cardiovascular status: blood pressure returned to baseline and stable Postop Assessment: no signs of nausea or vomiting Anesthetic complications: no    Last Vitals:  Vitals:   03/21/16 1752 03/21/16 1848  BP: (!) 170/109 (!) 146/96  Pulse: (!) 102 (!) 142  Resp: 18   Temp: 37.1 C     Last Pain:  Vitals:   03/21/16 1644  TempSrc:   PainSc: Asleep                 [name]

## 2016-03-21 NOTE — Progress Notes (Signed)
  Date: 03/21/2016  Patient name: [name]  Medical record number: [id number]  Date of birth: [birth year]   This patient has been seen and the plan of care was discussed with the house staff. Please see their note for complete details. I concur with their findings with the following additions/corrections: For R BKA today. Pt able to state procedure and need.   Burns Spain, MD 03/21/2016, 3:43 PM

## 2016-03-21 NOTE — Interval H&P Note (Signed)
History and Physical Interval Note:  03/21/2016 7:09 AM  [name]  has presented today for surgery, with the diagnosis of Osteomyelitis, Gangrene Right Foot  The various methods of treatment have been discussed with the patient and family. After consideration of risks, benefits and other options for treatment, the patient has consented to  Procedure(s): RIGHT BELOW KNEE AMPUTATION (Right) as a surgical intervention .  The patient's history has been reviewed, patient examined, no change in status, stable for surgery.  I have reviewed the patient's chart and labs.  Questions were answered to the patient's satisfaction.     DUDA,MARCUS V

## 2016-03-21 NOTE — Plan of Care (Signed)
Problem: Pain Managment: Goal: General experience of comfort will improve Outcome: Progressing Pt is resting; denies pain and discomfort at this time.

## 2016-03-21 NOTE — Anesthesia Procedure Notes (Signed)
Procedure Name: Intubation Date/Time: 03/21/2016 4:02 PM Performed by: Adonis Housekeeper Oxygen Delivery Method: Circle system utilized Preoxygenation: Pre-oxygenation with 100% oxygen Laryngoscope Size: Mac and 3 Grade View: Grade I Tube type: Oral Tube size: 7.0 mm Number of attempts: 1 Airway Equipment and Method: Stylet Placement Confirmation: ETT inserted through vocal cords under direct vision,  positive ETCO2 and breath sounds checked- equal and bilateral Secured at: 20 cm Tube secured with: Tape Dental Injury: Teeth and Oropharynx as per pre-operative assessment  Comments: #4 Lma did not seat well; switched to [employer]

## 2016-03-21 NOTE — Progress Notes (Signed)
Patient ID: [name], female   DOB: 11-20-1925, 82 y.o.   MRN: [id number] Patient may discharge to home when stable. Family states that they are currently caring for her at home with total assistance. Discharge with the Prevena portable wound VAC.

## 2016-03-21 NOTE — Anesthesia Preprocedure Evaluation (Addendum)
Anesthesia Evaluation  Patient identified by MRN, date of birth, ID band Patient awake and Patient unresponsive    Reviewed: Allergy & Precautions, NPO status , Patient's Chart, lab work & pertinent test results  Airway Mallampati: II  TM Distance: >3 FB Neck ROM: Full    Dental  (+) Edentulous Lower, Edentulous Upper   Pulmonary shortness of breath, asthma , pneumonia, resolved,    Pulmonary exam normal breath sounds clear to auscultation       Cardiovascular hypertension, Pt. on medications + Peripheral Vascular Disease and +CHF  Normal cardiovascular exam Rhythm:Regular Rate:Normal  ECHO 01-29-16:  Study Conclusions  - Left ventricle: The cavity size was normal. Wall thickness was   increased in a pattern of mild LVH. Systolic function was   severely reduced. The estimated ejection fraction was in the   range of 25% to 30%. Diffuse hypokinesis. There is akinesis of   the basal-midinferoseptal myocardium. - Aortic valve: Trileaflet; mildly thickened, mildly calcified   leaflets. There was mild regurgitation. - Mitral valve: There was moderate regurgitation. - Left atrium: The atrium was severely dilated. Volume/bsa, S: 65.7   ml/m^2. - Right ventricle: The cavity size was mildly dilated. Wall   thickness was normal. Systolic function was moderately reduced. - Right atrium: The atrium was moderately dilated. - Tricuspid valve: There was severe regurgitation. - Pulmonary arteries: Systolic pressure was moderately increased.   PA peak pressure: 60 mm Hg (S). - Pericardium, extracardiac: A small pericardial effusion was   identified posterior to the heart. There was no evidence of   hemodynamic compromise.   Neuro/Psych negative neurological ROS  negative psych ROS   GI/Hepatic Neg liver ROS, GERD  ,  Endo/Other  diabetesHyperthyroidism   Renal/GU negative Renal ROS  negative genitourinary   Musculoskeletal  (+)  Arthritis ,   Abdominal   Peds negative pediatric ROS (+)  Hematology  (+) anemia ,   Anesthesia Other Findings   Reproductive/Obstetrics negative OB ROS                           Anesthesia Physical Anesthesia Plan  ASA: IV  Anesthesia Plan: General   Post-op Pain Management:    Induction: Intravenous  Airway Management Planned: LMA  Additional Equipment:   Intra-op Plan:   Post-operative Plan: Extubation in OR  Informed Consent: I have reviewed the patients History and Physical, chart, labs and discussed the procedure including the risks, benefits and alternatives for the proposed anesthesia with the patient or authorized representative who has indicated his/her understanding and acceptance.   Dental advisory given  Plan Discussed with: CRNA  Anesthesia Plan Comments:        Anesthesia Quick Evaluation

## 2016-03-21 NOTE — H&P (View-Only) (Signed)
Patient ID: [name], female   DOB: 12/16/1925, 82 y.o.   MRN: [id number] MRI canceled due to the extensive necrotic soft tissue envelope. 

## 2016-03-22 LAB — CBC
HCT: 30 % — ABNORMAL LOW (ref 36.0–46.0)
Hemoglobin: 9.1 g/dL — ABNORMAL LOW (ref 12.0–15.0)
MCH: 29.3 pg (ref 26.0–34.0)
MCHC: 30.3 g/dL (ref 30.0–36.0)
MCV: 96.5 fL (ref 78.0–100.0)
PLATELETS: 234 10*3/uL (ref 150–400)
RBC: 3.11 MIL/uL — ABNORMAL LOW (ref 3.87–5.11)
RDW: 14.6 % (ref 11.5–15.5)
WBC: 5.1 10*3/uL (ref 4.0–10.5)

## 2016-03-22 LAB — BASIC METABOLIC PANEL
Anion gap: 6 (ref 5–15)
BUN: 10 mg/dL (ref 6–20)
CALCIUM: 8.4 mg/dL — AB (ref 8.9–10.3)
CO2: 28 mmol/L (ref 22–32)
Chloride: 101 mmol/L (ref 101–111)
Creatinine, Ser: 0.68 mg/dL (ref 0.44–1.00)
GFR calc Af Amer: >60 mL/min (ref >60)
GFR calc non Af Amer: >60 mL/min (ref >60)
GLUCOSE: 120 mg/dL — AB (ref 65–99)
Potassium: 4 mmol/L (ref 3.5–5.1)
Sodium: 135 mmol/L (ref 135–145)

## 2016-03-22 LAB — GLUCOSE, CAPILLARY
GLUCOSE-CAPILLARY: 80 mg/dL (ref 65–99)
GLUCOSE-CAPILLARY: 109 mg/dL — AB (ref 65–99)
Glucose-Capillary: 155 mg/dL — ABNORMAL HIGH (ref 65–99)
Glucose-Capillary: 170 mg/dL — ABNORMAL HIGH (ref 65–99)

## 2016-03-22 MED ORDER — POLYETHYLENE GLYCOL 3350 17 G PO PACK
17.0000 g | PACK | Freq: Every day | ORAL | Status: DC
  Administered 2016-03-22 – 2016-03-24 (×3): 17 g via ORAL
  Filled 2016-03-22 (×3): qty 1

## 2016-03-22 NOTE — Progress Notes (Addendum)
   Subjective:  This morning, her daughter [name] was at bedside. Per conversations with PT, [name] notes she has equipment at home, like [place] lift, and would not like to go to SNF. The patient denies any complaints but has not had a bowel movement.  Objective:  Vital signs in last 24 hours: Vitals:   03/22/16 0233 03/22/16 0627 03/22/16 0930 03/22/16 1000  BP: (!) 134/94 (!) 146/82 (!) 144/84   Pulse: (!) 140 (!) 102 (!) 132 (!) 103  Resp: 16 16    Temp: 98.6 F (37 C) 98.9 F (37.2 C) 97.1 F (36.2 C)   TempSrc: Oral Oral Oral   SpO2: 100% 98% 92%   Weight:      Height:       General: elderly African American female, resting in bed HEENT: EOMI, no scleral icterus Cardiac: irregular rhythm, ?LUSB murmur Pulm: clear to auscultation bilaterally, no wheezes, rales, or rhonchi Ext: s/p R transtibial amputation with wound vac Neuro: responds to questions appropriately; moving all extremities freely, oriented to name and place but not year  Assessment/Plan:  Principal Problem:   Right foot ulcer (HCC) Active Problems:   Essential hypertension   Chronic combined systolic and diastolic congestive heart failure (HCC)   Chronic atrial fibrillation (HCC)   Severe protein-calorie malnutrition (HCC)   Pulmonary hypertension (HCC)   PAD (peripheral artery disease) (HCC)   Osteomyelitis (HCC)  Mrs. Kumagai is an 82 year old female with non-insulin-dependent type 2 diabetes, peripheral vascular disease s/p angioplasty, hypertension, combined systolic and diastolic CHF [EF 11-21%, 01/29/16], chronic atrial fibrillation on anticoagulation, pulmonary hypertension, Churg-Strauss vasculitis hospitalized for right foot osteomyelitis and necrosis s/p transtibial amputation 8/4.  Right foot osteomyelitis and necrosis s/p transtibial amputation 8/4: Hb 9 this morning, down from 10 yesterday. PT recommending HH PT and family has equipment at home.  -Continue tramadol 50mg  every 6 hours as  needed with morphine 2mg  IV as needed for breakthrough pain -Start MiraLax to avoid constipation -Follow CBC  Non-insulin-dependent type 2 diabetes: A1c 7.9, 01/27/16. Her medications include metformin 1000 mg twice daily. -Start sensitive sliding scale insulin with meals -Continue soft diet but advancing as tolerated  Chronic combined systolic and diastolic congestive heart failure: Echo 01/29/16 with EF 25-30% with severe tricuspid regurgitation, severe RV dysfunction, diffuse hypokinesis, mild aortic regurgitation, moderate mitral regurgitation, severe left atrial dilatation, moderate RA dilatation. Home medications include spironolactone 25 mg daily, lisinopril 20 mg daily, metoprolol succinate 100 mg daily NOT Lasix per daughter. -Continue home meds EXCEPT spironolactone to avoid acute kidney injury in the setting of less oral intake  -Follow BMET tomorrow.  Peripheral artery disease status post angioplasty: Continue atorvastatin 40 mg  Chronic atrial fibrillation on anticoagulation: Restart rivoroxaban 15 mg with dinner [dose adjusted for renal clearance].  #FEN:  -Diet: Soft  #DVT prophylaxis: Rivaroxaban  #CODE STATUS: DNR/DNI -Defer to [name] [daughter] if patients lacks decision-making capacity -Confirmed with patient on admission  Dispo: Anticipated discharge in approximately 3-4 day(s).   LOS: 3 days   Beather Arbour, MD 03/22/2016, 2:22 PM Pager: @[email or web address] @

## 2016-03-22 NOTE — Evaluation (Signed)
Physical Therapy Evaluation Patient Details Name: [name] MRN: [id number] DOB: [birth year] Today's Date: 03/22/2016   History of Present Illness  Pt is an 82 year old female with non-insulin-dependent type 2 diabetes, peripheral vascular disease s/p angioplasty, hypertension, combined systolic and diastolic CHF, chronic atrial fibrillation on anticoagulation, pulmonary hypertension, and Churg-Strauss vasculitis. 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    Clinical Impression  Pt admitted with above diagnosis. Pt currently with functional limitations due to the deficits listed below (see PT Problem List). On eval, pt required +2 max assist bed mobility. She was unable to tolerate bed to chair transfer. Pt will benefit from skilled PT to increase their independence and safety with mobility to allow discharge to the venue listed below.  Family's desire is to take pt home. Spoke in length with pt's daughter regarding role of PT. Daughter expressing concerns about PT expectations of her mother. Explained our goal would be progressing transfers so pt can use BSC and sit up in w/c. Daughter verbalized understanding and in agreement with PT intervention.     Follow Up Recommendations Home health PT;Supervision/Assistance - 24 hour    Equipment Recommendations  None recommended by PT    Recommendations for Other Services       Precautions / Restrictions Precautions Precautions: Fall Restrictions Weight Bearing Restrictions: Yes RLE Weight Bearing: Non weight bearing      Mobility  Bed Mobility Overal bed mobility: Needs Assistance;+2 for physical assistance Bed Mobility: Supine to Sit;Sit to Supine     Supine to sit: Max assist;+2 for physical assistance Sit to supine: Max assist;+2 for physical assistance   General bed mobility comments: Multimodal cues for sequencing and participation. Pt having difficulty staying on task due to dementia/confusion.  Transfers                  General transfer comment: Attempted squat-pivot transfer without success. Pt retropulsive with increased fear of falling.   Ambulation/Gait             General Gait Details: nonambulatory  Stairs            Wheelchair Mobility    Modified Rankin (Stroke Patients Only)       Balance Overall balance assessment: Needs assistance Sitting-balance support: Bilateral upper extremity supported;Feet unsupported Sitting balance-Leahy Scale: Fair Sitting balance - Comments: Pt sat EOB x 3-5 minutes with min assist.                                     Pertinent Vitals/Pain Pain Assessment: Faces Faces Pain Scale: Hurts little more Pain Descriptors / Indicators: Guarding;Grimacing Pain Intervention(s): Limited activity within patient's tolerance;Monitored during session    Home Living Family/patient expects to be discharged to:: Private residence Living Arrangements: Children;Other relatives (granddaughter) Available Help at Discharge: Family;Available 24 hours/day Type of Home: House         Home Equipment: Walker - 2 wheels;Bedside commode;Hospital bed;Wheelchair - Careers adviser (comment) (hoyer lift)      Prior Function Level of Independence: Needs assistance   Gait / Transfers Assistance Needed: Per daughter, PTA pt able to transfer to w/c and BSC, with assist ranging from supervision to mod assist.     Comments: Pt's daughter has a hoyer lift for her husband. Mrs. Moultry has not needed the hoyer lift PTA but it is available for her use.     Hand Dominance   Dominant  Hand: Right    Extremity/Trunk Assessment   Upper Extremity Assessment: Generalized weakness           Lower Extremity Assessment: Generalized weakness      Cervical / Trunk Assessment: Kyphotic  Communication   Communication: No difficulties  Cognition Arousal/Alertness: Awake/alert Behavior During Therapy: WFL for tasks assessed/performed Overall  Cognitive Status: History of cognitive impairments - at baseline                      General Comments      Exercises        Assessment/Plan    PT Assessment Patient needs continued PT services  PT Diagnosis Generalized weakness;Acute pain   PT Problem List Decreased strength;Decreased balance;Decreased activity tolerance;Decreased mobility;Decreased safety awareness;Pain;Decreased knowledge of precautions;Decreased cognition  PT Treatment Interventions DME instruction;Functional mobility training;Therapeutic activities;Therapeutic exercise;Balance training;Patient/family education;Cognitive remediation;Neuromuscular re-education   PT Goals (Current goals can be found in the Care Plan section) Acute Rehab PT Goals Patient Stated Goal: not stated PT Goal Formulation: With patient/family Time For Goal Achievement: 04/05/16 Potential to Achieve Goals: Fair    Frequency Min 3X/week   Barriers to discharge        Co-evaluation               End of Session Equipment Utilized During Treatment: Gait belt Activity Tolerance: Patient limited by fatigue;Patient limited by pain Patient left: in bed;with family/visitor present;with call bell/phone within reach;with nursing/sitter in room Nurse Communication: Mobility status         Time: 0630-1601 PT Time Calculation (min) (ACUTE ONLY): 24 min   Charges:   PT Evaluation $PT Eval Moderate Complexity: 1 Procedure PT Treatments $Therapeutic Activity: 8-22 mins   PT G Codes:        Ilda Foil 03/22/2016, 9:36 AM

## 2016-03-23 LAB — CBC
HEMATOCRIT: 28.4 % — AB (ref 36.0–46.0)
Hemoglobin: 8.8 g/dL — ABNORMAL LOW (ref 12.0–15.0)
MCH: 29.3 pg (ref 26.0–34.0)
MCHC: 31 g/dL (ref 30.0–36.0)
MCV: 94.7 fL (ref 78.0–100.0)
Platelets: 224 10*3/uL (ref 150–400)
RBC: 3 MIL/uL — AB (ref 3.87–5.11)
RDW: 14.6 % (ref 11.5–15.5)
WBC: 4.3 10*3/uL (ref 4.0–10.5)

## 2016-03-23 LAB — GLUCOSE, CAPILLARY
GLUCOSE-CAPILLARY: 232 mg/dL — AB (ref 65–99)
GLUCOSE-CAPILLARY: 146 mg/dL — AB (ref 65–99)
Glucose-Capillary: 186 mg/dL — ABNORMAL HIGH (ref 65–99)
Glucose-Capillary: 137 mg/dL — ABNORMAL HIGH (ref 65–99)

## 2016-03-23 LAB — BASIC METABOLIC PANEL
ANION GAP: 6 (ref 5–15)
BUN: 8 mg/dL (ref 6–20)
CHLORIDE: 100 mmol/L — AB (ref 101–111)
CO2: 29 mmol/L (ref 22–32)
CREATININE: 0.64 mg/dL (ref 0.44–1.00)
Calcium: 8.3 mg/dL — ABNORMAL LOW (ref 8.9–10.3)
GFR calc non Af Amer: >60 mL/min (ref >60)
Glucose, Bld: 147 mg/dL — ABNORMAL HIGH (ref 65–99)
Potassium: 3.5 mmol/L (ref 3.5–5.1)
SODIUM: 135 mmol/L (ref 135–145)

## 2016-03-23 MED ORDER — PHENOL 1.4 % MT LIQD
1.0000 | OROMUCOSAL | Status: DC | PRN
  Administered 2016-03-23: 1 via OROMUCOSAL
  Filled 2016-03-23: qty 177

## 2016-03-23 MED ORDER — SPIRONOLACTONE 25 MG PO TABS
25.0000 mg | ORAL_TABLET | Freq: Every day | ORAL | Status: DC
  Administered 2016-03-23: 25 mg via ORAL
  Filled 2016-03-23 (×2): qty 1

## 2016-03-23 NOTE — Progress Notes (Signed)
Subjective: [name] is awake and eating breakfast in bed. She slept well overnight and denies any pain this morning. She had 2 BMs yesterday. She would like to stay in the hospital for one more day; she does not feel ready to go back home.  Objective: Vital signs in last 24 hours: Vitals:   03/22/16 1310 03/22/16 2059 03/22/16 2107 03/23/16 0442  BP: (!) 157/73 (!) 154/91  (!) 144/82  Pulse: 91 95  95  Resp:  18  19  Temp: 99.1 F (37.3 C) 99.4 F (37.4 C)  99.2 F (37.3 C)  TempSrc: Oral Oral  Oral  SpO2: 100% 99% 98% 98%  Weight:      Height:       Weight change:   Intake/Output Summary (Last 24 hours) at 03/23/16 1301 Last data filed at 03/23/16 0900  Gross per 24 hour  Intake              390 ml  Output              625 ml  Net             -235 ml   BP (!) 144/82 (BP Location: Right Arm)   Pulse 95   Temp 99.2 F (37.3 C) (Oral)   Resp 19   Ht 5' 7"  (1.702 m)   Wt 62 kg (136 lb 11.2 oz)   SpO2 98%   BMI 21.41 kg/m   General appearance: alert, cooperative and no distress Head: Normocephalic, without obvious abnormality, atraumatic Lungs: clear to auscultation bilaterally Heart: irregularly irregular rhythm, normal rate. III/VI holosystolic ejection murmur at LLSB. Noclick, rub or gallop. Abdomen: soft, non-tender; bowel sounds normal; no masses, no organomegaly. Small umbilical hernia present. Extremities: Wound vac in place over R lower extremity w/ sanguinous drain output. Neurologic: Grossly normal, alert and oriented x2  Lab Results: Component     Latest Ref Rng & Units 03/23/2016  Sodium     135 - 145 mmol/L 135  Potassium     3.5 - 5.1 mmol/L 3.5  Chloride     101 - 111 mmol/L 100 (L)  CO2     22 - 32 mmol/L 29  Glucose     65 - 99 mg/dL 147 (H)  BUN     6 - 20 mg/dL 8  Creatinine     0.44 - 1.00 mg/dL 0.64  Calcium     8.9 - 10.3 mg/dL 8.3 (L)  EGFR (Non-African Amer.)     >60 mL/min >60  EGFR (African American)     >60 mL/min >60    Anion gap     5 - 15 6  WBC     4.0 - 10.5 K/uL 4.3  RBC     3.87 - 5.11 MIL/uL 3.00 (L)  Hemoglobin     12.0 - 15.0 g/dL 8.8 (L)  HCT     36.0 - 46.0 % 28.4 (L)  MCV     78.0 - 100.0 fL 94.7  MCH     26.0 - 34.0 pg 29.3  MCHC     30.0 - 36.0 g/dL 31.0  RDW     11.5 - 15.5 % 14.6  Platelets     150 - 400 K/uL 224   Micro Results: Recent Results (from the past 240 hour(s))  Blood Cultures x 2 sites     Status: None (Preliminary result)   Collection Time: 03/19/16  3:15 PM  Result Value Ref Range  Status   Specimen Description BLOOD LEFT ANTECUBITAL  Final   Special Requests BOTTLES DRAWN AEROBIC AND ANAEROBIC  5CC  Final   Culture NO GROWTH 3 DAYS  Final   Report Status PENDING  Incomplete  Blood Cultures x 2 sites     Status: None (Preliminary result)   Collection Time: 03/19/16  3:30 PM  Result Value Ref Range Status   Specimen Description BLOOD RIGHT ANTECUBITAL  Final   Special Requests BOTTLES DRAWN AEROBIC AND ANAEROBIC  5CC  Final   Culture NO GROWTH 3 DAYS  Final   Report Status PENDING  Incomplete  Surgical pcr screen     Status: None   Collection Time: 03/20/16  5:15 PM  Result Value Ref Range Status   MRSA, PCR NEGATIVE NEGATIVE Final   Staphylococcus aureus NEGATIVE NEGATIVE Final    Comment:        The Xpert SA Assay (FDA approved for NASAL specimens in patients over 31 years of age), is one component of a comprehensive surveillance program.  Test performance has been validated by [facility] for patients greater than or equal to 64 year old. It is not intended to diagnose infection nor to guide or monitor treatment.    Studies/Results: No results found.   Medications:  Scheduled Meds: . brimonidine  1 drop Left Eye Q12H  . dorzolamide  1 drop Left Eye BID  . ferrous sulfate  325 mg Oral Q breakfast  . insulin aspart  0-15 Units Subcutaneous TID WC  . metoprolol succinate  100 mg Oral Daily  . mometasone-formoterol  2 puff Inhalation BID   . multivitamin with minerals  1 tablet Oral Daily  . polyethylene glycol  17 g Oral Daily  . rivaroxaban  15 mg Oral Q supper  . senna  1 tablet Oral BID   Continuous Infusions: . sodium chloride 10 mL/hr at 03/23/16 1123  . lactated ringers 10 mL/hr at 03/21/16 1453   PRN Meds:.acetaminophen **OR** acetaminophen, albuterol, methocarbamol **OR** methocarbamol (ROBAXIN)  IV, ondansetron **OR** ondansetron (ZOFRAN) IV, phenol, sodium chloride flush, traMADol   Assessment/Plan: Principal Problem:   Right foot ulcer (HCC) Active Problems:   Essential hypertension   Chronic combined systolic and diastolic congestive heart failure (HCC)   Chronic atrial fibrillation (HCC)   Pulmonary hypertension (HCC)   PAD (peripheral artery disease) (HCC)   Severe protein-calorie malnutrition ([place])   Osteomyelitis ([place])  Necroticulcer of right foot:POD #2 after transtibial amputation to the right foot. PT recommending [facility] [facility]. Family already has equipment at home. Discussed transferring to bedside chair today as patient is somewhat fearful of leaving her bed. Encouraged use of spirometer.   --continue Miralax for constipation   --continue tramadol 50 mg q6 h prn, morphine 79m iv prn for breakthrough pain --home tomorrow with home health PT  TIIDM: on metformin. HbA1c 7.9 on 01/27/2016. --holding metformin --SSI with meals  Chronic Atrial fibrillation: at home, takes xarelto 20 mg and metoprolol 100 mg. While in hospital, she is receiving 15 mg xarelto, adjusted for renal function.                              Chronic biventricular heart failure: Currently euvolemic. Echo 01/29/2016 shows EF 25-30% with severe Tr, severe RV dysfxn. She takes spironolactone 25 mg qday, lisinopril 231mqday, and metoprolol 10093mday at home. Lasix was noted as a home med in patient's records but according to daughter, she  is NOT supposed to be taking  it. --continue holding home ACE-I and spironolactone to prevent acute kidney injury in setting of decreased po intake --continue metoprolol for rate control in setting of A fib  Peripheral artery disease:Likely contributor to poor wound healing. Arteriogram 01/30/2016 revealed occlusion of all 3 tibial vessels in RLE and mild stenosis of distal superficial femoral and proximal popliteal arteries in LLE. Angioplasty of entire posterior tibial artery was performed with good return of flow post-procedure.   FEN: Soft diet. Advancing as tolerated. --hold home zantac 32m  DVT pNTH:[id number] mg (renal dose adjusted)                                             Code Status: DNR  Dispo: Patient lives alone with assistance from daughter and granddaughter who live nearby. In discussion with daughter KNunzio Cory Ms. WOttersonwill go home with home health PT. Arrange follow-up with Dr. DSharol Givenin 1 wk.   This is a MCareers information officerNote.  The care of the patient was discussed with Dr. PPosey Prontoand the assessment and plan formulated with their assistance.  Please see their attached note for official documentation of the daily encounter.   LOS: 4 days   TPershing [facility] Medical Student 03/23/2016, 1:01 PM

## 2016-03-23 NOTE — Progress Notes (Signed)
   Subjective:  This morning, she denies any complaints and feels her pain is adequately relieved with tramadol. She also notes that she has moved her bowels since her surgery and that the food no longer tastes salty. We reviewed with her how to use an incentive spirometer to make sure she is not at risk for atelectasis.   Objective:  Vital signs in last 24 hours: Vitals:   03/22/16 2107 03/23/16 0442 03/23/16 1500 03/23/16 2027  BP:  (!) 144/82 122/69   Pulse:  95 62   Resp:  19 18   Temp:  99.2 F (37.3 C) 99.2 F (37.3 C)   TempSrc:  Oral Oral   SpO2: 98% 98% 100% 98%  Weight:      Height:       General: elderly African American female, resting in bed HEENT: EOMI, no scleral icterus Cardiac: irregular rhythm, ?LUSB murmur Pulm: clear to auscultation bilaterally, no wheezes, rales, or rhonchi Ext: s/p R transtibial amputation with wound vac Neuro: responds to questions appropriately; moving all extremities freely, oriented to name and place but not year  Assessment/Plan:  Principal Problem:   Right foot ulcer (HCC) Active Problems:   Essential hypertension   Chronic combined systolic and diastolic congestive heart failure (HCC)   Chronic atrial fibrillation (HCC)   Severe protein-calorie malnutrition (HCC)   Pulmonary hypertension (HCC)   PAD (peripheral artery disease) (HCC)   Osteomyelitis (HCC)  [name] is an 82 year old female with non-insulin-dependent type 2 diabetes, peripheral vascular disease s/p angioplasty, hypertension, combined systolic and diastolic CHF [EF 46-80%, 01/29/16], chronic atrial fibrillation on anticoagulation, pulmonary hypertension, Churg-Strauss vasculitis hospitalized for right foot osteomyelitis and necrosis s/p transtibial amputation 8/4.  Right foot osteomyelitis and necrosis s/p transtibial amputation 8/4: Hb 9 this morning, stable from yesterday.  -Continue tramadol 50mg  every 6 hours as needed with morphine 2mg  IV as needed for  breakthrough pain -Continue MiraLax to avoid constipation  Non-insulin-dependent type 2 diabetes: A1c 7.9, 01/27/16. Her medications include metformin 1000 mg twice daily. -Start sensitive sliding scale insulin with meals -Continue soft diet but advance as tolerated  Chronic combined systolic and diastolic congestive heart failure: Echo 01/29/16 with EF 25-30% with severe tricuspid regurgitation, severe RV dysfunction, diffuse hypokinesis, mild aortic regurgitation, moderate mitral regurgitation, severe left atrial dilatation, moderate RA dilatation. Home medications include spironolactone 25 mg daily, lisinopril 20 mg daily, metoprolol succinate 100 mg daily NOT Lasix per daughter. -Continue home meds and spironolactone  -Hold lisinopril for now given normotension.    Peripheral artery disease status post angioplasty: Continue atorvastatin 40 mg  Chronic atrial fibrillation on anticoagulation: Restart rivoroxaban 15 mg with dinner [dose adjusted for renal clearance].  #FEN:  -Diet: Soft  #DVT prophylaxis: Rivaroxaban  #CODE STATUS: DNR/DNI -Defer to [name] [daughter] if patients lacks decision-making capacity -Confirmed with patient on admission  Dispo: Anticipated discharge in approximately 3-4 day(s).   LOS: 4 days   Beather Arbour, MD 03/23/2016, 8:55 PM Pager: @[phone] @

## 2016-03-24 ENCOUNTER — Encounter (HOSPITAL_COMMUNITY): Payer: Self-pay | Admitting: General Practice

## 2016-03-24 DIAGNOSIS — M86171 Other acute osteomyelitis, right ankle and foot: Secondary | ICD-10-CM

## 2016-03-24 DIAGNOSIS — B9689 Other specified bacterial agents as the cause of diseases classified elsewhere: Secondary | ICD-10-CM

## 2016-03-24 LAB — CBC
HCT: 28.3 % — ABNORMAL LOW (ref 36.0–46.0)
Hemoglobin: 8.7 g/dL — ABNORMAL LOW (ref 12.0–15.0)
MCH: 29.1 pg (ref 26.0–34.0)
MCHC: 30.7 g/dL (ref 30.0–36.0)
MCV: 94.6 fL (ref 78.0–100.0)
Platelets: 261 10*3/uL (ref 150–400)
RBC: 2.99 MIL/uL — AB (ref 3.87–5.11)
RDW: 14.7 % (ref 11.5–15.5)
WBC: 3.7 10*3/uL — ABNORMAL LOW (ref 4.0–10.5)

## 2016-03-24 LAB — CULTURE, BLOOD (ROUTINE X 2)
CULTURE: NO GROWTH
Culture: NO GROWTH

## 2016-03-24 LAB — GLUCOSE, CAPILLARY
GLUCOSE-CAPILLARY: 120 mg/dL — AB (ref 65–99)
Glucose-Capillary: 179 mg/dL — ABNORMAL HIGH (ref 65–99)

## 2016-03-24 MED ORDER — TRAMADOL HCL 50 MG PO TABS: 50.0000 mg | ORAL_TABLET | Freq: Four times a day (QID) | ORAL | 0 refills | Status: DC | PRN

## 2016-03-24 MED ORDER — POLYETHYLENE GLYCOL 3350 17 G PO PACK: 17.0000 g | PACK | Freq: Every day | ORAL | 0 refills | Status: DC

## 2016-03-24 MED ORDER — ACETAMINOPHEN 500 MG PO TABS: 500.0000 mg | ORAL_TABLET | Freq: Four times a day (QID) | ORAL | Status: AC | PRN

## 2016-03-24 MED ORDER — RIVAROXABAN 15 MG PO TABS: 15.0000 mg | ORAL_TABLET | Freq: Every day | ORAL | 1 refills | Status: DC

## 2016-03-24 NOTE — Progress Notes (Signed)
Inpatient Diabetes Program Recommendations  AACE/ADA: New Consensus Statement on Inpatient Glycemic Control (2015)  Target Ranges:  Prepandial:   less than 140 mg/dL      Peak postprandial:   less than 180 mg/dL (1-2 hours)      Critically ill patients:  140 - 180 mg/dL   Lab Results  Component Value Date   GLUCAP 120 (H) 03/24/2016   HGBA1C 7.9 (H) 01/27/2016    Review of Glycemic Control  Results for LOTTI, ANDLER (MRN [id number]) as of 03/24/2016 11:52  Ref. Range 03/23/2016 08:11 03/23/2016 12:13 03/23/2016 16:47 03/23/2016 21:34 03/24/2016 07:44  Glucose-Capillary Latest Ref Range: 65 - 99 mg/dL 837 (H) 290 (H) 211 (H) 137 (H) 120 (H)   Diabetes history: Type 2 Outpatient Diabetes medications: Metformin 1000mg  bid Current orders for Inpatient glycemic control: Novolog 0-15 units tid  Inpatient Diabetes Program Recommendations: Agree with current orders for blood sugar management.  Susette Racer, RN, BA, MHA, CDE Diabetes Coordinator Inpatient Diabetes Program  [phone] (Team Pager) [phone] [facility] Office) 03/24/2016 11:54 AM

## 2016-03-24 NOTE — Progress Notes (Signed)
Medication Samples have been provided to the patient.  Drug name: Xarelto (rivaroxaban)       Strength: 15 mg        Qty: 14 tabs (1 bottle)  LOT: [id number]  Exp.Date: 08/2016  Dosing instructions: Take 1 tablet by mouth every evening with supper.  The patient has been instructed regarding the correct time, dose, and frequency of taking this medication, including desired effects and most common side effects.  *This is a dose change from the patient's previous PTA dose (from 20mg  to 15mg ) based on age and renal function during hospital admission, starting on 03/22/16. CrCl (adjusted for Scr and age): ~30-45. Patient was educated to discard any 20mg  doses at home and continue with 15mg .   Allie Bossier, PharmD PGY1 Pharmacy Resident [phone] (Pager) 03/24/2016 12:09 PM

## 2016-03-24 NOTE — Care Management Note (Signed)
Case Management Note  Patient Details  Name: Naliah Keesling MRN: [id number] Date of Birth: 1926/07/14  Subjective/Objective:                    Action/Plan:   Expected Discharge Date:                  Expected Discharge Plan:  Home w Home Health Services  In-House Referral:     Discharge planning Services  CM Consult  Post Acute Care Choice:  Home Health Choice offered to:  Patient  DME Arranged:    DME Agency:     HH Arranged:  PT HH Agency:  [facility]  Status of Service:  Completed, signed off  If discussed at Long Length of Stay Meetings, dates discussed:    Additional Comments:  Kingsley Plan, RN 03/24/2016, 12:06 PM

## 2016-03-24 NOTE — Progress Notes (Signed)
  Date: 03/24/2016  Patient name: [name]  Medical record number: [id number]  Date of birth: 1926/07/25   This patient has been seen and the plan of care was discussed with the house staff. Please see their note for complete details. I concur with their findings with the following additions/corrections: [name] is completely without complaint today. She is ready to go home, she and family choosing home over SNF. [facility] follow up.   Burns Spain, MD 03/24/2016, 3:21 PM

## 2016-03-24 NOTE — Discharge Instructions (Signed)
You were hospitalized for a very bad foot infection for which we had to take off part of your leg.

## 2016-03-24 NOTE — Care Management Important Message (Signed)
Important Message  Patient Details  Name: [name] MRN: [id number] Date of Birth: 1926/01/10   Medicare Important Message Given:  Yes    Bernadette Hoit 03/24/2016, 2:07 PM

## 2016-03-24 NOTE — Discharge Summary (Signed)
Name: [name] MRN: [id number] DOB: [birth year] 82 y.o. PCP: Wendy Rung, DO  Date of Admission: 03/19/2016 12:49 PM Date of Discharge: 03/24/2016 Attending Physician: Wendy Media, MD  Discharge Diagnosis: Principal Problem:   Osteomyelitis of right foot [facility]) Active Problems:   Essential hypertension   Chronic combined systolic and diastolic congestive heart failure (HCC)   Chronic atrial fibrillation (HCC)   Severe protein-calorie malnutrition (HCC)   Pulmonary hypertension (HCC)   PAD (peripheral artery disease) (HCC)   Discharge Medications:   Medication List    STOP taking these medications   furosemide 80 MG tablet Commonly known as:  LASIX   lisinopril 20 MG tablet Commonly known as:  PRINIVIL,ZESTRIL   loperamide 2 MG tablet Commonly known as:  IMODIUM A-D     TAKE these medications   acetaminophen 500 MG tablet Commonly known as:  TYLENOL Take 1 tablet (500 mg total) by mouth every 6 (six) hours as needed for mild pain (or Fever >/= 101). Please do not take more than 6 tablets today.   albuterol 108 (90 Base) MCG/ACT inhaler Commonly known as:  PROVENTIL HFA;VENTOLIN HFA Inhale 2 puffs into the lungs every 6 (six) hours as needed for wheezing or shortness of breath.   BD ULTRA-FINE LANCETS lancets Use to check blood sugar one time daily   brimonidine 0.1 % Soln Commonly known as:  ALPHAGAN P Place 1 drop into the left eye every 12 (twelve) hours.   dorzolamide 2 % ophthalmic solution Commonly known as:  TRUSOPT Place 1 drop into the left eye 2 (two) times daily.   DULERA 100-5 MCG/ACT Aero Generic drug:  mometasone-formoterol INHALE 2 PUFFS INTO THE LUNGS TWICE DAILY   ferrous sulfate 325 (65 FE) MG tablet TAKE 1 TABLET(325 MG) BY MOUTH DAILY   glucose blood test strip Use as instructed   metFORMIN 1000 MG tablet Commonly known as:  GLUCOPHAGE TAKE 1 TABLET(1000 MG) BY MOUTH TWICE DAILY WITH A MEAL   metoprolol succinate 100 MG  24 hr tablet Commonly known as:  TOPROL-XL Take 1 tablet daily   polyethylene glycol packet Commonly known as:  MIRALAX / GLYCOLAX Take 17 g by mouth daily.   ranitidine 75 MG tablet Commonly known as:  ZANTAC Take 75 mg by mouth daily as needed for heartburn (or nausea).   Rivaroxaban 15 MG Tabs tablet Commonly known as:  XARELTO Take 1 tablet (15 mg total) by mouth daily with supper. What changed:  medication strength  how much to take   spironolactone 25 MG tablet Commonly known as:  ALDACTONE TAKE 1 TABLET BY MOUTH EVERY DAY   traMADol 50 MG tablet Commonly known as:  ULTRAM Take 1 tablet (50 mg total) by mouth every 6 (six) hours as needed. What changed:  Another medication with the same name was added. Make sure you understand how and when to take each.   traMADol 50 MG tablet Commonly known as:  ULTRAM Take 1 tablet (50 mg total) by mouth every 6 (six) hours as needed for moderate pain. What changed:  You were already taking a medication with the same name, and this prescription was added. Make sure you understand how and when to take each.       Disposition and follow-up:   [name] was discharged from [name] in Stable condition.  Please address the following.  #1 Post-operative recovery: follow-up with Dr. Lajoyce Lowery, activity, healing  #2 Blood pressure: need for lisinopril  Follow-up Appointments:  Follow-up Information    Lowery,Wendy V, MD Follow up in 1 week(s).   Specialty:  Orthopedic Surgery Contact information: [address] [place] [address] [phone]        [facility]. Go on 03/31/2016.   Why:  1045AM Contact information: [address] [place] [address] [phone]          Hospital Course by problem list: Principal Problem:   Osteomyelitis of right foot [facility]) Active Problems:   Essential hypertension   Chronic combined systolic and diastolic  congestive heart failure (HCC)   Chronic atrial fibrillation (HCC)   Severe protein-calorie malnutrition (HCC)   Pulmonary hypertension (HCC)   PAD (peripheral artery disease) (HCC)   Right foot osteomyelitis: As she was afebrile and hemodynamically stable, she was not started on IV antibiotics. Blood cultures remain without growth throughout her hospital stay. Orthopedic Surgery was consulted and recommended below-knee amputation. Vascular Surgery was also consulted to determine if she had appropriate vascular supply, and they agreed with below-knee amputation. She underwent the procedure on 03/21/16 without complication and was tolerating oral intake, some physical activity, and did not report any difficulties with bowel or bladder function. The physical therapy recommended skilled nursing facility, the family had a lot of equipment at home, she was sent home with home health services with close follow-up.  Chronic atrial fibrillation: Rivaroxaban was adjusted down from 20 mg to 15 mg per her renal function. This medication was held 24 hours preoperatively and 24 hours postoperatively. At follow-up, please assess that she is taking this dose.   Chronic combined systolic and diastolic heart failure: Her most recent echocardiogram from 01/29/2016 reveals a decreased ejection fraction of 25-30% with severe tricuspid regurgitation, and severe right ventricular dysfunction. She takes spironolactone 25 mg, lisinopril 20 mg, and metoprolol 100 mg at home, all of which were held preoperatively; furosemide was removed from her home medication list because she does not take it at home. Lisinopril was not restarted postoperatively given that she was normotensive, so please reassess at follow-up whether she needs to have this medication restarted.  Discharge Vitals:   BP 139/71 (BP Location: Left Arm)   Pulse 76   Temp 98.9 F (37.2 C) (Oral)   Resp 18   Ht 5\' 7"  (1.702 m)   Wt 136 lb 11.2 oz (62 kg)   SpO2  100%   BMI 21.41 kg/m    Discharge Instructions: Discharge Instructions    Call MD for:  persistant dizziness or light-headedness    Complete by:  As directed   Call MD for:  severe uncontrolled pain    Complete by:  As directed   Call MD for:  temperature >100.4    Complete by:  As directed   Increase activity slowly    Complete by:  As directed   Leave dressing on - Keep it clean, dry, and intact until clinic visit    Complete by:  As directed   Neg Press Wound Therapy / Incisional    Complete by:  As directed   Discharge with the portable Prevena wound VAC pump. Discontinue the pump and dressing when the pump stops working. Continue to wear the medical compression prosthetic shrinker at all times.      Signed: Beather Arbour, MD 03/24/2016, 5:37 PM   Pager: @[email or web address] @

## 2016-03-24 NOTE — Progress Notes (Signed)
Subjective: [name] is awake and talkative this morning. Daughter is at bedside. According to patient, she ate breakfast and feels ready to go home today. Denies pain, dyspnea, chills. She has not had a BM today but is receiving Miralax. Her granddaughter will pick her up this afternoon.  Objective: Vital signs in last 24 hours: Vitals:   03/23/16 1500 03/23/16 2027 03/23/16 2131 03/24/16 0558  BP: 122/69  132/82 139/71  Pulse: 62  97 76  Resp: Temp: 99.2 F (37.3 C)  100 F (37.8 C) 98.9 F (37.2 C)  TempSrc: Oral  Oral Oral  SpO2: 100% 98% 98% 100%  Weight:      Height:       Weight change:   Intake/Output Summary (Last 24 hours) at 03/24/16 1355 Last data filed at 03/24/16 0601  Gross per 24 hour  Intake              295 ml  Output              250 ml  Net               45 ml   General appearance: alert, cooperative and no distress Head: Normocephalic, without obvious abnormality, atraumatic Lungs: clear to auscultation bilaterally Heart: irregularly irregular rhythm, normal rate. III/VI holosystolic ejection murmur at LLSB. Noclick, rub or gallop. Abdomen: soft, non-tender; bowel sounds normal; no masses, no organomegaly. Small umbilical hernia present. Extremities: Wound vac in place over R lower extremity w/ sanguinous drain output. Neurologic: Grossly normal, alert and oriented x3  Lab Results: No new lab results  Micro Results: Recent Results (from the past 240 hour(s))  Blood Cultures x 2 sites     Status: None (Preliminary result)   Collection Time: 03/19/16  3:15 PM  Result Value Ref Range Status   Specimen Description BLOOD LEFT ANTECUBITAL  Final   Special Requests BOTTLES DRAWN AEROBIC AND ANAEROBIC  5CC  Final   Culture NO GROWTH 4 DAYS  Final   Report Status PENDING  Incomplete  Blood Cultures x 2 sites     Status: None (Preliminary result)   Collection Time: 03/19/16  3:30 PM  Result Value Ref Range Status   Specimen Description  BLOOD RIGHT ANTECUBITAL  Final   Special Requests BOTTLES DRAWN AEROBIC AND ANAEROBIC  5CC  Final   Culture NO GROWTH 4 DAYS  Final   Report Status PENDING  Incomplete  Surgical pcr screen     Status: None   Collection Time: 03/20/16  5:15 PM  Result Value Ref Range Status   MRSA, PCR NEGATIVE NEGATIVE Final   Staphylococcus aureus NEGATIVE NEGATIVE Final    Comment:        The Xpert SA Assay (FDA approved for NASAL specimens in patients over 76 years of age), is one component of a comprehensive surveillance program.  Test performance has been validated by [facility] for patients greater than or equal to 78 year old. It is not intended to diagnose infection nor to guide or monitor treatment.    Studies/Results: No results found. Medications:  Scheduled Meds: . brimonidine  1 drop Left Eye Q12H  . dorzolamide  1 drop Left Eye BID  . ferrous sulfate  325 mg Oral Q breakfast  . insulin aspart  0-15 Units Subcutaneous TID WC  . metoprolol succinate  100 mg Oral Daily  . mometasone-formoterol  2 puff Inhalation BID  . multivitamin with minerals  1 tablet  Oral Daily  . polyethylene glycol  17 g Oral Daily  . rivaroxaban  15 mg Oral Q supper  . senna  1 tablet Oral BID  . spironolactone  25 mg Oral Daily   Continuous Infusions: . sodium chloride 10 mL/hr at 03/23/16 1123  . lactated ringers 10 mL/hr at 03/21/16 1453   PRN Meds:.acetaminophen **OR** acetaminophen, albuterol, ondansetron **OR** ondansetron (ZOFRAN) IV, phenol, sodium chloride flush, traMADol   Assessment/Plan: Principal Problem:   Osteomyelitis of right foot (HCC) Active Problems:   Essential hypertension   Chronic combined systolic and diastolic congestive heart failure (HCC)   Chronic atrial fibrillation (HCC)   Pulmonary hypertension (HCC)   PAD (peripheral artery disease) (HCC)   Severe protein-calorie malnutrition (HCC)  Necroticulcer of right foot:POD #3 after transtibial amputation to the  right foot. PT recommending HH PT since family has equipment at home. We will address motorized chair at next clinic visit. We will continue miralax for constipation and home tramadol 50 mg q6h as needed x 15 tablets at discharge.   TIIDM: on metformin. HbA1c 7.9 on 01/27/2016. Restart home metformin. Ms. Contessa does not take insulin at baseline.   Chronic Atrial fibrillation: at home, takes xarelto 20 mg and metoprolol 100 mg. We will continue these medications at discharge.   Chronic biventricular heart failure: She has been euvolemic during her hospital stay.Echo 01/29/2016 shows EF 25-30% with severe Tr, severe RV dysfxn. She takes spironolactone 25 mg qday, lisinopril 20mg  qday, and metoprolol 100mg  qday at home. Lasix was noted as a home med in patient's records but according to daughter, she is NOT supposed to be taking it. --continue holding home ACE-I until outpatient follow-up appointment as she is normotensive. We want to avoid acute kidney injury in the setting of normal BP's and decreased po intake. Restarted spironolactone yesterday. Continue both spironolactone and metoprolol after discharge.  Peripheral artery disease:Likely contributor to poor wound healing. Arteriogram 01/30/2016 revealed occlusion of all 3 tibial vessels in RLE and mild stenosis of distal superficial femoral and proximal popliteal arteries in LLE. Angioplasty of entire posterior tibial artery was performed with good return of flow post-procedure.   FEN: Tolerating soft diet (patient does not wear dentures). Restart home Zantac.  DVT AST:MHDQQIW 15 mg (renal dose adjusted). Reviewed and restarted home 20 mg.   Code Status: DNR  Dispo: Patient lives alone with assistance from daughter and granddaughter who live nearby. In discussion with daughter [name], Ms. Giang will go home with home health PT and return to clinic in 1 wk. She will call and make follow-up  appointment with Dr. Lajoyce Corners.  This is a Psychologist, occupational Note.  The care of the patient was discussed with Dr. Allena Katz and the assessment and plan formulated with their assistance.  Please see their attached note for official documentation of the daily encounter.   LOS: 5 days   Juanna Cao, Medical Student 03/24/2016, 1:55 PM

## 2016-03-24 NOTE — Progress Notes (Addendum)
   Subjective: This morning, her daughter was at bedside. She did not report any additional complaints, like nausea, vomiting, change in appetite. She had bowel movements yesterday. She is looking forward to going home today and appreciates the fact that her food no longer tastes salty.  Objective:  Vital signs in last 24 hours: Vitals:   03/23/16 1500 03/23/16 2027 03/23/16 2131 03/24/16 0558  BP: 122/69  132/82 139/71  Pulse: 62  97 76  Resp: 18  18 18   Temp: 99.2 F (37.3 C)  100 F (37.8 C) 98.9 F (37.2 C)  TempSrc: Oral  Oral Oral  SpO2: 100% 98% 98% 100%  Weight:      Height:       Physical Exam  Constitutional: No distress.  Elderly, frail African American female.  Cardiovascular:  Irregular rhythm. ?LUSB murmur  Pulmonary/Chest: Effort normal and breath sounds normal.  Abdominal: Soft. Bowel sounds are normal.  Musculoskeletal:  S/p R transtibial amputation. Covered in bandage with wound vac in place.  Skin: Skin is warm and dry.     Assessment/Plan:  Principal Problem:   Right foot ulcer (HCC) Active Problems:   Essential hypertension   Chronic combined systolic and diastolic congestive heart failure (HCC)   Chronic atrial fibrillation (HCC)   Severe protein-calorie malnutrition (HCC)   Pulmonary hypertension (HCC)   PAD (peripheral artery disease) (HCC)   Osteomyelitis (HCC)  [name] is an 82 year old female with non-insulin-dependent type 2 diabetes, peripheral vascular disease s/p angioplasty, hypertension, combined systolic and diastolic CHF [EF 23-95%, 01/29/16],chronic atrial fibrillation on anticoagulation, pulmonary hypertension, Churg-Strauss vasculitis hospitalized for right foot osteomyelitis and necrosis s/p transtibial amputation 8/4.  Right foot osteomyelitis and necrosis s/p transtibial amputation 8/4: Hb stable at 9 this morning x 72 hours post-op.  -Discharge on Tramadol 50mg  every 6 hours as needed x 15 tablets as she has only been  requiring it once in the last few days -Prescribe MiraLax to avoid constipation -Remove PICC line prior to discharge to avoid risk of infection  Non-insulin-dependent type 2 diabetes: A1c 7.9, 01/27/16. Her medications include metformin 1000 mg twice daily. -Resume home medications  Chronic combined systolic and diastolic congestive heart failure: Echo 01/29/16 with EF 25-30% with severe tricuspid regurgitation, severe RV dysfunction, diffuse hypokinesis, mild aortic regurgitation, moderate mitral regurgitation, severe left atrial dilatation, moderate RA dilatation. Home medications include spironolactone 25 mg daily, lisinopril 20 mg daily, metoprolol succinate 100 mg daily NOT Lasix per daughter. -Resume all home medications but hold lisinopril until she can follow-up to avoid risk of acute kidney injury as she has been normotensive off this medication and may not be at full oral intake -Discontinue Lasix from med list as she is not on this medication at home  Peripheral artery disease status post angioplasty: Continue atorvastatin 40 mg  Chronic atrial fibrillation on anticoagulation: Restart rivoroxaban15 mg with dinner [dose adjusted for renal clearance]. -Cancelled pharmacy prescription  #FEN: -Diet: Soft  #DVT prophylaxis: Rivaroxaban  #CODE STATUS: DNR/DNI -Defer to [name] Jennings[daughter] if patients lacks decision-making capacity -Confirmed with patient on admission  Dispo: Anticipated discharge today.  LOS: 5 days   Beather Arbour, MD 03/24/2016, 1:07 PM Pager: @[email or web address] @

## 2016-03-24 NOTE — Progress Notes (Signed)
Patient ID: [name], female   DOB: 1926-04-18, 82 y.o.   MRN: [id number] Patient is status post right transtibial amputation. There is no new drainage from the wound VAC. Patient may be transitioned from the large wound VAC pump to the portable wound VAC pump. Patient is cared for by her daughter at home she would be safe for discharge from the orthopedic standpoint at this time. I will follow-up in the office in 1 week.

## 2016-03-25 DIAGNOSIS — I272 Other secondary pulmonary hypertension: Secondary | ICD-10-CM | POA: Diagnosis not present

## 2016-03-25 DIAGNOSIS — Z452 Encounter for adjustment and management of vascular access device: Secondary | ICD-10-CM | POA: Diagnosis not present

## 2016-03-25 DIAGNOSIS — I4891 Unspecified atrial fibrillation: Secondary | ICD-10-CM | POA: Diagnosis not present

## 2016-03-25 DIAGNOSIS — M86171 Other acute osteomyelitis, right ankle and foot: Secondary | ICD-10-CM | POA: Diagnosis not present

## 2016-03-25 DIAGNOSIS — Z7902 Long term (current) use of antithrombotics/antiplatelets: Secondary | ICD-10-CM | POA: Diagnosis not present

## 2016-03-25 DIAGNOSIS — M19012 Primary osteoarthritis, left shoulder: Secondary | ICD-10-CM | POA: Diagnosis not present

## 2016-03-25 DIAGNOSIS — J45909 Unspecified asthma, uncomplicated: Secondary | ICD-10-CM | POA: Diagnosis not present

## 2016-03-25 DIAGNOSIS — E1151 Type 2 diabetes mellitus with diabetic peripheral angiopathy without gangrene: Secondary | ICD-10-CM | POA: Diagnosis not present

## 2016-03-25 DIAGNOSIS — Z792 Long term (current) use of antibiotics: Secondary | ICD-10-CM | POA: Diagnosis not present

## 2016-03-25 DIAGNOSIS — M810 Age-related osteoporosis without current pathological fracture: Secondary | ICD-10-CM | POA: Diagnosis not present

## 2016-03-25 DIAGNOSIS — M301 Polyarteritis with lung involvement [Churg-Strauss]: Secondary | ICD-10-CM | POA: Diagnosis not present

## 2016-03-25 DIAGNOSIS — I110 Hypertensive heart disease with heart failure: Secondary | ICD-10-CM | POA: Diagnosis not present

## 2016-03-25 DIAGNOSIS — E0500 Thyrotoxicosis with diffuse goiter without thyrotoxic crisis or storm: Secondary | ICD-10-CM | POA: Diagnosis not present

## 2016-03-25 DIAGNOSIS — Z9981 Dependence on supplemental oxygen: Secondary | ICD-10-CM | POA: Diagnosis not present

## 2016-03-25 DIAGNOSIS — I509 Heart failure, unspecified: Secondary | ICD-10-CM | POA: Diagnosis not present

## 2016-03-25 DIAGNOSIS — Z7984 Long term (current) use of oral hypoglycemic drugs: Secondary | ICD-10-CM | POA: Diagnosis not present

## 2016-03-25 DIAGNOSIS — Z5181 Encounter for therapeutic drug level monitoring: Secondary | ICD-10-CM | POA: Diagnosis not present

## 2016-03-25 DIAGNOSIS — S91101D Unspecified open wound of right great toe without damage to nail, subsequent encounter: Secondary | ICD-10-CM | POA: Diagnosis not present

## 2016-03-25 DIAGNOSIS — L89610 Pressure ulcer of right heel, unstageable: Secondary | ICD-10-CM | POA: Diagnosis not present

## 2016-03-26 DIAGNOSIS — I4891 Unspecified atrial fibrillation: Secondary | ICD-10-CM | POA: Diagnosis not present

## 2016-03-26 DIAGNOSIS — Z7984 Long term (current) use of oral hypoglycemic drugs: Secondary | ICD-10-CM | POA: Diagnosis not present

## 2016-03-26 DIAGNOSIS — Z792 Long term (current) use of antibiotics: Secondary | ICD-10-CM | POA: Diagnosis not present

## 2016-03-26 DIAGNOSIS — M19012 Primary osteoarthritis, left shoulder: Secondary | ICD-10-CM | POA: Diagnosis not present

## 2016-03-26 DIAGNOSIS — Z452 Encounter for adjustment and management of vascular access device: Secondary | ICD-10-CM | POA: Diagnosis not present

## 2016-03-26 DIAGNOSIS — I509 Heart failure, unspecified: Secondary | ICD-10-CM | POA: Diagnosis not present

## 2016-03-26 DIAGNOSIS — Z5181 Encounter for therapeutic drug level monitoring: Secondary | ICD-10-CM | POA: Diagnosis not present

## 2016-03-26 DIAGNOSIS — S91101D Unspecified open wound of right great toe without damage to nail, subsequent encounter: Secondary | ICD-10-CM | POA: Diagnosis not present

## 2016-03-26 DIAGNOSIS — M86171 Other acute osteomyelitis, right ankle and foot: Secondary | ICD-10-CM | POA: Diagnosis not present

## 2016-03-26 DIAGNOSIS — J45909 Unspecified asthma, uncomplicated: Secondary | ICD-10-CM | POA: Diagnosis not present

## 2016-03-26 DIAGNOSIS — L89610 Pressure ulcer of right heel, unstageable: Secondary | ICD-10-CM | POA: Diagnosis not present

## 2016-03-26 DIAGNOSIS — Z7902 Long term (current) use of antithrombotics/antiplatelets: Secondary | ICD-10-CM | POA: Diagnosis not present

## 2016-03-26 DIAGNOSIS — M301 Polyarteritis with lung involvement [Churg-Strauss]: Secondary | ICD-10-CM | POA: Diagnosis not present

## 2016-03-26 DIAGNOSIS — I272 Other secondary pulmonary hypertension: Secondary | ICD-10-CM | POA: Diagnosis not present

## 2016-03-26 DIAGNOSIS — E1151 Type 2 diabetes mellitus with diabetic peripheral angiopathy without gangrene: Secondary | ICD-10-CM | POA: Diagnosis not present

## 2016-03-26 DIAGNOSIS — I110 Hypertensive heart disease with heart failure: Secondary | ICD-10-CM | POA: Diagnosis not present

## 2016-03-26 DIAGNOSIS — M810 Age-related osteoporosis without current pathological fracture: Secondary | ICD-10-CM | POA: Diagnosis not present

## 2016-03-26 DIAGNOSIS — E0500 Thyrotoxicosis with diffuse goiter without thyrotoxic crisis or storm: Secondary | ICD-10-CM | POA: Diagnosis not present

## 2016-03-26 DIAGNOSIS — Z9981 Dependence on supplemental oxygen: Secondary | ICD-10-CM | POA: Diagnosis not present

## 2016-03-27 DIAGNOSIS — Z7902 Long term (current) use of antithrombotics/antiplatelets: Secondary | ICD-10-CM | POA: Diagnosis not present

## 2016-03-27 DIAGNOSIS — I4891 Unspecified atrial fibrillation: Secondary | ICD-10-CM | POA: Diagnosis not present

## 2016-03-27 DIAGNOSIS — Z9981 Dependence on supplemental oxygen: Secondary | ICD-10-CM | POA: Diagnosis not present

## 2016-03-27 DIAGNOSIS — Z7984 Long term (current) use of oral hypoglycemic drugs: Secondary | ICD-10-CM | POA: Diagnosis not present

## 2016-03-27 DIAGNOSIS — J45909 Unspecified asthma, uncomplicated: Secondary | ICD-10-CM | POA: Diagnosis not present

## 2016-03-27 DIAGNOSIS — M301 Polyarteritis with lung involvement [Churg-Strauss]: Secondary | ICD-10-CM | POA: Diagnosis not present

## 2016-03-27 DIAGNOSIS — L89610 Pressure ulcer of right heel, unstageable: Secondary | ICD-10-CM | POA: Diagnosis not present

## 2016-03-27 DIAGNOSIS — M810 Age-related osteoporosis without current pathological fracture: Secondary | ICD-10-CM | POA: Diagnosis not present

## 2016-03-27 DIAGNOSIS — I110 Hypertensive heart disease with heart failure: Secondary | ICD-10-CM | POA: Diagnosis not present

## 2016-03-27 DIAGNOSIS — I272 Other secondary pulmonary hypertension: Secondary | ICD-10-CM | POA: Diagnosis not present

## 2016-03-27 DIAGNOSIS — Z5181 Encounter for therapeutic drug level monitoring: Secondary | ICD-10-CM | POA: Diagnosis not present

## 2016-03-27 DIAGNOSIS — M86171 Other acute osteomyelitis, right ankle and foot: Secondary | ICD-10-CM | POA: Diagnosis not present

## 2016-03-27 DIAGNOSIS — I509 Heart failure, unspecified: Secondary | ICD-10-CM | POA: Diagnosis not present

## 2016-03-27 DIAGNOSIS — Z792 Long term (current) use of antibiotics: Secondary | ICD-10-CM | POA: Diagnosis not present

## 2016-03-27 DIAGNOSIS — E0500 Thyrotoxicosis with diffuse goiter without thyrotoxic crisis or storm: Secondary | ICD-10-CM | POA: Diagnosis not present

## 2016-03-27 DIAGNOSIS — M19012 Primary osteoarthritis, left shoulder: Secondary | ICD-10-CM | POA: Diagnosis not present

## 2016-03-27 DIAGNOSIS — Z452 Encounter for adjustment and management of vascular access device: Secondary | ICD-10-CM | POA: Diagnosis not present

## 2016-03-27 DIAGNOSIS — E1151 Type 2 diabetes mellitus with diabetic peripheral angiopathy without gangrene: Secondary | ICD-10-CM | POA: Diagnosis not present

## 2016-03-27 DIAGNOSIS — S91101D Unspecified open wound of right great toe without damage to nail, subsequent encounter: Secondary | ICD-10-CM | POA: Diagnosis not present

## 2016-03-31 ENCOUNTER — Encounter: Payer: Self-pay | Admitting: Internal Medicine

## 2016-03-31 ENCOUNTER — Encounter (INDEPENDENT_AMBULATORY_CARE_PROVIDER_SITE_OTHER): Payer: Self-pay

## 2016-03-31 ENCOUNTER — Ambulatory Visit (INDEPENDENT_AMBULATORY_CARE_PROVIDER_SITE_OTHER): Payer: Medicare Other | Admitting: Internal Medicine

## 2016-03-31 VITALS — BP 146/95 | HR 84 | Temp 98.3°F

## 2016-03-31 DIAGNOSIS — Z89511 Acquired absence of right leg below knee: Secondary | ICD-10-CM | POA: Diagnosis not present

## 2016-03-31 DIAGNOSIS — Z09 Encounter for follow-up examination after completed treatment for conditions other than malignant neoplasm: Secondary | ICD-10-CM | POA: Diagnosis not present

## 2016-03-31 DIAGNOSIS — I5042 Chronic combined systolic (congestive) and diastolic (congestive) heart failure: Secondary | ICD-10-CM | POA: Diagnosis not present

## 2016-03-31 DIAGNOSIS — L299 Pruritus, unspecified: Secondary | ICD-10-CM

## 2016-03-31 MED ORDER — CETIRIZINE HCL 5 MG PO TABS: 5.0000 mg | ORAL_TABLET | Freq: Every day | ORAL | 0 refills | Status: AC

## 2016-03-31 MED ORDER — LISINOPRIL 10 MG PO TABS: 10.0000 mg | ORAL_TABLET | Freq: Every day | ORAL | 0 refills | Status: DC

## 2016-03-31 NOTE — Patient Instructions (Addendum)
[name]  Please start taking Cetirizine 5mg  once daily as needed for itching Please keep body moisturized to also help with itching   Please start taking Lisinopril 10mg  once daily

## 2016-03-31 NOTE — Assessment & Plan Note (Signed)
Assessment:  Diffuse Pruritis Patient states she's had itching on her arms, back and head for the past month.  On exam no rashes, signs of scabies or lice were found.    Plan - Cetirizine 5mg  daily  - CMP

## 2016-03-31 NOTE — Progress Notes (Signed)
CC: Hospital follow up for below the knee amputation from right foot osteomyelitis   HPI:  [name] is a 82 y.o. female with PMHx of HTN, combined systolic and diastolic congestive heart failure, and DM type II that presents to [facility] for hospital follow up for her right below the knee amputation on 8/4.  Patient has had home health follow up and she states they come and change the dressing every other day.  She denies pain in the area currently but states she has some pain that comes and goes.  She states no issues with her leg amputation site.  She has seen surgery and is due for follow up on the 24th for staple removal.  In the most recent admission her rivaroxaban was adjusted down to 15mg .  She states she is currently on the same dose.  She was taken off Lisinopril 20mg  in previous admission because she was normotensive and was not restarted on lisinopril at discharge.  She also states that she has had itching on her back, arms and head for the past month and has not tried anything to help alleviate the itching.     Past Medical History:  Diagnosis Date  . Asthma   . Atrial fibrillation, permanent (HCC)    a. Dx 04/2013, on metoprolol, dig and xarelto  . Cataract    left eye  . CHF (congestive heart failure) (HCC)   . Chronic combined systolic and diastolic heart failure (HCC)    a. 02/2012 EF:20-25%  . Churg-Strauss syndrome (HCC)    a. Sural nerve biopsy 02/2000.  . Ectopic atrial tachycardia (HCC)    a. Documented on tele as IP 02/2012.  . Glaucoma   . Grave's disease    a. s/p thyroidectomy.  . Hypercholesteremia   . Hypertension   . Hypokalemia    a. Previously felt due to diuretics, req supplementation.  . Lipoma    left inner thigh  . Multifocal atrial tachycardia (HCC)    a. Documented as OP 01/2012.  Marland Kitchen NSVT (nonsustained ventricular tachycardia) (HCC)    a. During CHF adm 2001.  . On home oxygen therapy    "wear it mostly at night" (09/20/2014)  . Osteoarthritis    left shoulder  . Osteopenia    DEXA 2004  . Pericardial effusion    a. Echo 02/2012: small-mod pericardial effusion.  . Pleural effusion, left    a. Thoracentesis 2001 - per notes, no malignant cells, was transudative.  . Pneumonia 1930's  . Pseudogout   . Pulmonary HTN (HCC)    a. Mod by echo 02/2012.  Marland Kitchen Shortness of breath   . Type II diabetes mellitus (HCC)   . Valvular heart disease    a. Echo 02/2012: mod MR/TR, mild AI.  Marland Kitchen VASCULITIS   . Venous insufficiency     Review of Systems:  Review of Systems  Constitutional: Negative for chills and fever.  Respiratory: Negative for shortness of breath.   Cardiovascular: Negative for chest pain.  Gastrointestinal: Negative for abdominal pain.  Musculoskeletal: Negative for myalgias.  Skin: Positive for itching.     Physical Exam:  Vitals:   03/31/16 1101  BP: (!) 146/95  Pulse: 84  Temp: 98.3 F (36.8 C)  TempSrc: Oral  SpO2: 98%   Physical Exam  Constitutional: She is well-developed, well-nourished, and in no distress.  Cardiovascular: Normal rate and regular rhythm.   Pulmonary/Chest: Effort normal and breath sounds normal.  Abdominal: Soft. There is no tenderness.  Musculoskeletal: She exhibits no edema.  BKA on right, at surgical site staples still in place, no drainage or erythema noted, area was dry and appears to be healing well   Skin: Skin is warm and dry. No rash noted.  No signs of scabies or lice on exam    Assessment & Plan:   See encounters tab for problem based medical decision making.   Patient seen with Dr. Heide SparkNarendra

## 2016-03-31 NOTE — Assessment & Plan Note (Addendum)
Assessment:  BKA of right side Patient had surgery on 8/4 for right foot osteomyelitis.  At today's visit staples were still in place.  Area was wrapped with ace bandage and below bandage were two 4x4 guaze pads over the surgical site.  When bandage taken off the site was clean with no purulent drainage.  Area was dry with no signs of infection.  She states she changes the dressing every other day.  Patient states she has already followed up with surgery and has an appointment on the 24th for staple removal.   Plan - Applied clean dressing in office  - Advised to continue to change dressing every other day - Advised to continue to follow up with surgery

## 2016-03-31 NOTE — Assessment & Plan Note (Addendum)
Assessment:  Chronic combined systolic and diastolic congestive heart failure Patient's last Echo on 01/29/16 showed EF LV 25% -30%.  Ace-I has a mortality benefit for systolic CHF.  Patient's lisinopril 20mg  was discontinued on discharge because BP was normotensive.  Today's BP was 146/95  Plan - Re-start lisinopril 10mg  as long as BP can support - Re-check BP at next visit

## 2016-04-01 LAB — CMP14 + ANION GAP
A/G RATIO: 1.1 — AB (ref 1.2–2.2)
ALBUMIN: 3.6 g/dL (ref 3.5–4.7)
ALT: 4 IU/L (ref 0–32)
AST: 14 IU/L (ref 0–40)
Alkaline Phosphatase: 151 IU/L — ABNORMAL HIGH (ref 39–117)
Anion Gap: 18 mmol/L (ref 10.0–18.0)
BILIRUBIN TOTAL: 1 mg/dL (ref 0.0–1.2)
BUN / CREAT RATIO: 15 (ref 12–28)
BUN: 10 mg/dL (ref 8–27)
CALCIUM: 8.4 mg/dL — AB (ref 8.7–10.3)
CHLORIDE: 99 mmol/L (ref 96–106)
CO2: 23 mmol/L (ref 18–29)
Creatinine, Ser: 0.67 mg/dL (ref 0.57–1.00)
GFR, EST AFRICAN AMERICAN: 91 mL/min/{1.73_m2} (ref >59)
GFR, EST NON AFRICAN AMERICAN: 79 mL/min/{1.73_m2} (ref >59)
GLOBULIN, TOTAL: 3.2 g/dL (ref 1.5–4.5)
Glucose: 112 mg/dL — ABNORMAL HIGH (ref 65–99)
POTASSIUM: 4.5 mmol/L (ref 3.5–5.2)
Sodium: 140 mmol/L (ref 134–144)
TOTAL PROTEIN: 6.8 g/dL (ref 6.0–8.5)

## 2016-04-02 DIAGNOSIS — M19012 Primary osteoarthritis, left shoulder: Secondary | ICD-10-CM | POA: Diagnosis not present

## 2016-04-02 DIAGNOSIS — E1151 Type 2 diabetes mellitus with diabetic peripheral angiopathy without gangrene: Secondary | ICD-10-CM | POA: Diagnosis not present

## 2016-04-02 DIAGNOSIS — Z5181 Encounter for therapeutic drug level monitoring: Secondary | ICD-10-CM | POA: Diagnosis not present

## 2016-04-02 DIAGNOSIS — I110 Hypertensive heart disease with heart failure: Secondary | ICD-10-CM | POA: Diagnosis not present

## 2016-04-02 DIAGNOSIS — Z7984 Long term (current) use of oral hypoglycemic drugs: Secondary | ICD-10-CM | POA: Diagnosis not present

## 2016-04-02 DIAGNOSIS — I4891 Unspecified atrial fibrillation: Secondary | ICD-10-CM | POA: Diagnosis not present

## 2016-04-02 DIAGNOSIS — M86171 Other acute osteomyelitis, right ankle and foot: Secondary | ICD-10-CM | POA: Diagnosis not present

## 2016-04-02 DIAGNOSIS — Z9981 Dependence on supplemental oxygen: Secondary | ICD-10-CM | POA: Diagnosis not present

## 2016-04-02 DIAGNOSIS — S91101D Unspecified open wound of right great toe without damage to nail, subsequent encounter: Secondary | ICD-10-CM | POA: Diagnosis not present

## 2016-04-02 DIAGNOSIS — I272 Other secondary pulmonary hypertension: Secondary | ICD-10-CM | POA: Diagnosis not present

## 2016-04-02 DIAGNOSIS — L89610 Pressure ulcer of right heel, unstageable: Secondary | ICD-10-CM | POA: Diagnosis not present

## 2016-04-02 DIAGNOSIS — I509 Heart failure, unspecified: Secondary | ICD-10-CM | POA: Diagnosis not present

## 2016-04-02 DIAGNOSIS — Z792 Long term (current) use of antibiotics: Secondary | ICD-10-CM | POA: Diagnosis not present

## 2016-04-02 DIAGNOSIS — J45909 Unspecified asthma, uncomplicated: Secondary | ICD-10-CM | POA: Diagnosis not present

## 2016-04-02 DIAGNOSIS — E0500 Thyrotoxicosis with diffuse goiter without thyrotoxic crisis or storm: Secondary | ICD-10-CM | POA: Diagnosis not present

## 2016-04-02 DIAGNOSIS — M810 Age-related osteoporosis without current pathological fracture: Secondary | ICD-10-CM | POA: Diagnosis not present

## 2016-04-02 DIAGNOSIS — M301 Polyarteritis with lung involvement [Churg-Strauss]: Secondary | ICD-10-CM | POA: Diagnosis not present

## 2016-04-02 DIAGNOSIS — Z7902 Long term (current) use of antithrombotics/antiplatelets: Secondary | ICD-10-CM | POA: Diagnosis not present

## 2016-04-02 DIAGNOSIS — Z452 Encounter for adjustment and management of vascular access device: Secondary | ICD-10-CM | POA: Diagnosis not present

## 2016-04-03 ENCOUNTER — Telehealth: Payer: Self-pay | Admitting: *Deleted

## 2016-04-03 DIAGNOSIS — I272 Other secondary pulmonary hypertension: Secondary | ICD-10-CM | POA: Diagnosis not present

## 2016-04-03 DIAGNOSIS — Z7984 Long term (current) use of oral hypoglycemic drugs: Secondary | ICD-10-CM | POA: Diagnosis not present

## 2016-04-03 DIAGNOSIS — I509 Heart failure, unspecified: Secondary | ICD-10-CM | POA: Diagnosis not present

## 2016-04-03 DIAGNOSIS — I4891 Unspecified atrial fibrillation: Secondary | ICD-10-CM | POA: Diagnosis not present

## 2016-04-03 DIAGNOSIS — Z452 Encounter for adjustment and management of vascular access device: Secondary | ICD-10-CM | POA: Diagnosis not present

## 2016-04-03 DIAGNOSIS — L89610 Pressure ulcer of right heel, unstageable: Secondary | ICD-10-CM | POA: Diagnosis not present

## 2016-04-03 DIAGNOSIS — E1151 Type 2 diabetes mellitus with diabetic peripheral angiopathy without gangrene: Secondary | ICD-10-CM | POA: Diagnosis not present

## 2016-04-03 DIAGNOSIS — Z5181 Encounter for therapeutic drug level monitoring: Secondary | ICD-10-CM | POA: Diagnosis not present

## 2016-04-03 DIAGNOSIS — M86171 Other acute osteomyelitis, right ankle and foot: Secondary | ICD-10-CM | POA: Diagnosis not present

## 2016-04-03 DIAGNOSIS — S91101D Unspecified open wound of right great toe without damage to nail, subsequent encounter: Secondary | ICD-10-CM | POA: Diagnosis not present

## 2016-04-03 DIAGNOSIS — J45909 Unspecified asthma, uncomplicated: Secondary | ICD-10-CM | POA: Diagnosis not present

## 2016-04-03 DIAGNOSIS — Z9981 Dependence on supplemental oxygen: Secondary | ICD-10-CM | POA: Diagnosis not present

## 2016-04-03 DIAGNOSIS — E0500 Thyrotoxicosis with diffuse goiter without thyrotoxic crisis or storm: Secondary | ICD-10-CM | POA: Diagnosis not present

## 2016-04-03 DIAGNOSIS — Z7902 Long term (current) use of antithrombotics/antiplatelets: Secondary | ICD-10-CM | POA: Diagnosis not present

## 2016-04-03 DIAGNOSIS — M301 Polyarteritis with lung involvement [Churg-Strauss]: Secondary | ICD-10-CM | POA: Diagnosis not present

## 2016-04-03 DIAGNOSIS — I110 Hypertensive heart disease with heart failure: Secondary | ICD-10-CM | POA: Diagnosis not present

## 2016-04-03 DIAGNOSIS — M810 Age-related osteoporosis without current pathological fracture: Secondary | ICD-10-CM | POA: Diagnosis not present

## 2016-04-03 DIAGNOSIS — M19012 Primary osteoarthritis, left shoulder: Secondary | ICD-10-CM | POA: Diagnosis not present

## 2016-04-03 DIAGNOSIS — Z792 Long term (current) use of antibiotics: Secondary | ICD-10-CM | POA: Diagnosis not present

## 2016-04-03 NOTE — Telephone Encounter (Signed)
Would like wound care orders. Please advise.

## 2016-04-03 NOTE — Telephone Encounter (Signed)
Spoke with Gala Romney who agreed to call Dr. Audrie Lia office for wound care instructions.

## 2016-04-03 NOTE — Telephone Encounter (Signed)
VO given for [facility] for 1x per week for 6 weeks and PT evaluation and treat as indicated. Do you agree?

## 2016-04-03 NOTE — Telephone Encounter (Signed)
Requesting wound care order. Please call back.

## 2016-04-03 NOTE — Telephone Encounter (Signed)
Agree with HH PT eval and treat.  I have yet to see her and I am not sure I would be able to provide wound care orders beyond the discharge orders.  Can they contact Dr Audrie Lia office for the wound care instructions?

## 2016-04-07 ENCOUNTER — Encounter: Payer: Self-pay | Admitting: Surgery

## 2016-04-07 NOTE — Progress Notes (Signed)
Internal Medicine Clinic Attending  I saw and evaluated the patient.  I personally confirmed the key portions of the history and exam documented by Dr. Hoffman and I reviewed pertinent patient test results.  The assessment, diagnosis, and plan were formulated together and I agree with the documentation in the resident's note.      

## 2016-04-08 DIAGNOSIS — M86171 Other acute osteomyelitis, right ankle and foot: Secondary | ICD-10-CM | POA: Diagnosis not present

## 2016-04-08 DIAGNOSIS — Z7902 Long term (current) use of antithrombotics/antiplatelets: Secondary | ICD-10-CM | POA: Diagnosis not present

## 2016-04-08 DIAGNOSIS — I509 Heart failure, unspecified: Secondary | ICD-10-CM | POA: Diagnosis not present

## 2016-04-08 DIAGNOSIS — L89610 Pressure ulcer of right heel, unstageable: Secondary | ICD-10-CM | POA: Diagnosis not present

## 2016-04-08 DIAGNOSIS — Z7984 Long term (current) use of oral hypoglycemic drugs: Secondary | ICD-10-CM | POA: Diagnosis not present

## 2016-04-08 DIAGNOSIS — Z5181 Encounter for therapeutic drug level monitoring: Secondary | ICD-10-CM | POA: Diagnosis not present

## 2016-04-08 DIAGNOSIS — S91101D Unspecified open wound of right great toe without damage to nail, subsequent encounter: Secondary | ICD-10-CM | POA: Diagnosis not present

## 2016-04-08 DIAGNOSIS — Z9981 Dependence on supplemental oxygen: Secondary | ICD-10-CM | POA: Diagnosis not present

## 2016-04-08 DIAGNOSIS — M301 Polyarteritis with lung involvement [Churg-Strauss]: Secondary | ICD-10-CM | POA: Diagnosis not present

## 2016-04-08 DIAGNOSIS — M19012 Primary osteoarthritis, left shoulder: Secondary | ICD-10-CM | POA: Diagnosis not present

## 2016-04-08 DIAGNOSIS — I4891 Unspecified atrial fibrillation: Secondary | ICD-10-CM | POA: Diagnosis not present

## 2016-04-08 DIAGNOSIS — I272 Other secondary pulmonary hypertension: Secondary | ICD-10-CM | POA: Diagnosis not present

## 2016-04-08 DIAGNOSIS — I110 Hypertensive heart disease with heart failure: Secondary | ICD-10-CM | POA: Diagnosis not present

## 2016-04-08 DIAGNOSIS — J45909 Unspecified asthma, uncomplicated: Secondary | ICD-10-CM | POA: Diagnosis not present

## 2016-04-08 DIAGNOSIS — E1151 Type 2 diabetes mellitus with diabetic peripheral angiopathy without gangrene: Secondary | ICD-10-CM | POA: Diagnosis not present

## 2016-04-08 DIAGNOSIS — M810 Age-related osteoporosis without current pathological fracture: Secondary | ICD-10-CM | POA: Diagnosis not present

## 2016-04-08 DIAGNOSIS — Z452 Encounter for adjustment and management of vascular access device: Secondary | ICD-10-CM | POA: Diagnosis not present

## 2016-04-08 DIAGNOSIS — E0500 Thyrotoxicosis with diffuse goiter without thyrotoxic crisis or storm: Secondary | ICD-10-CM | POA: Diagnosis not present

## 2016-04-08 DIAGNOSIS — Z792 Long term (current) use of antibiotics: Secondary | ICD-10-CM | POA: Diagnosis not present

## 2016-04-09 ENCOUNTER — Encounter: Payer: Self-pay | Admitting: Surgery

## 2016-04-09 ENCOUNTER — Telehealth: Payer: Self-pay | Admitting: Surgery

## 2016-04-09 ENCOUNTER — Ambulatory Visit (INDEPENDENT_AMBULATORY_CARE_PROVIDER_SITE_OTHER): Payer: Medicare Other | Admitting: Surgery

## 2016-04-09 VITALS — BP 131/67 | HR 91 | Temp 98.3°F | Resp 18 | Ht 67.0 in | Wt 136.0 lb

## 2016-04-09 DIAGNOSIS — I7025 Atherosclerosis of native arteries of other extremities with ulceration: Secondary | ICD-10-CM

## 2016-04-09 NOTE — Progress Notes (Signed)
Patient name: [name] MRN: [id number] DOB: September 07, 1925 Sex: female  REASON FOR VISIT: Hospital follow-up  HPI: [name] is a 82 y.o. female who is well known to me, having undergone percutaneous revascularization for a right leg ulcer.  Unfortunately, she developed progression of her wound and was admitted to the hospital and ultimately underwent right below-knee amputation by Dr. Lajoyce Corners approximately 2 weeks ago.  She is here today for his to visit scheduled follow-up.  She does have some pain around her stump which is draining.  She is scheduled to see Dr. Lajoyce Corners tomorrow.  The patient is now living with her daughter at home.  Current Outpatient Prescriptions  Medication Sig Dispense Refill  . acetaminophen (TYLENOL) 500 MG tablet Take 1 tablet (500 mg total) by mouth every 6 (six) hours as needed for mild pain (or Fever >/= 101). Please do not take more than 6 tablets today.    Marland Kitchen albuterol (PROVENTIL HFA;VENTOLIN HFA) 108 (90 Base) MCG/ACT inhaler Inhale 2 puffs into the lungs every 6 (six) hours as needed for wheezing or shortness of breath. 1 Inhaler 2  . BD ULTRA-FINE LANCETS lancets Use to check blood sugar one time daily 100 each 5  . brimonidine (ALPHAGAN P) 0.1 % SOLN Place 1 drop into the left eye every 12 (twelve) hours. 45 mL 0  . cetirizine (ZYRTEC) 5 MG tablet Take 1 tablet (5 mg total) by mouth daily. 30 tablet 0  . dorzolamide (TRUSOPT) 2 % ophthalmic solution Place 1 drop into the left eye 2 (two) times daily. 10 mL 3  . DULERA 100-5 MCG/ACT AERO INHALE 2 PUFFS INTO THE LUNGS TWICE DAILY 13 g 3  . ferrous sulfate 325 (65 FE) MG tablet TAKE 1 TABLET(325 MG) BY MOUTH DAILY 30 tablet 3  . glucose blood test strip Use as instructed 100 each 12  . lisinopril (PRINIVIL,ZESTRIL) 10 MG tablet Take 1 tablet (10 mg total) by mouth daily. 30 tablet 0  . metFORMIN (GLUCOPHAGE) 1000 MG tablet TAKE 1 TABLET(1000 MG) BY MOUTH TWICE DAILY WITH A MEAL 180  tablet 1  . metoprolol succinate (TOPROL-XL) 100 MG 24 hr tablet Take 1 tablet daily 60 tablet 0  . polyethylene glycol (MIRALAX / GLYCOLAX) packet Take 17 g by mouth daily. 14 each 0  . ranitidine (ZANTAC) 75 MG tablet Take 75 mg by mouth daily as needed for heartburn (or nausea).     . Rivaroxaban (XARELTO) 15 MG TABS tablet Take 1 tablet (15 mg total) by mouth daily with supper. 30 tablet 1  . spironolactone (ALDACTONE) 25 MG tablet TAKE 1 TABLET BY MOUTH EVERY DAY 90 tablet 0  . traMADol (ULTRAM) 50 MG tablet Take 1 tablet (50 mg total) by mouth every 6 (six) hours as needed. 30 tablet 0  . traMADol (ULTRAM) 50 MG tablet Take 1 tablet (50 mg total) by mouth every 6 (six) hours as needed for moderate pain. 15 tablet 0   No current facility-administered medications for this visit.     REVIEW OF SYSTEMS:  [X]  denotes positive finding, [ ]  denotes negative finding Cardiac  Comments:  Chest pain or chest pressure:    Shortness of breath upon exertion:    Short of breath when lying flat:    Irregular heart rhythm:    Constitutional    Fever or chills:     See history of present illness for other pertinent positives and negatives  PHYSICAL EXAM: Vitals:   04/09/16 1203  BP: 131/67  Pulse: 91  Resp: 18  Temp: 98.3 F (36.8 C)  TempSrc: Oral  SpO2: 95%  Weight: 136 lb (61.7 kg)  Height: 5\' 7"  (1.702 m)    GENERAL: The patient is a well-nourished female, in no acute distress. The vital signs are documented above. CARDIOVASCULAR: There is a regular rate and rhythm. PULMONARY: There is good air exchange bilaterally without wheezing or rales. Musculoskeletal: Patient's right BKA.  Is healing.  There is an area on the anterior side which has some skin separation around to the eschar.  There is also drainage from the posterior medial aspect of the incision.  I probed this area with a Q-tip and was able to advance a Q-tip approximate 2 cm.  No purulence was evacuated however there was a  small hematoma.  MEDICAL ISSUES: The patient is scheduled to see Dr. Lajoyce Cornersuda tomorrow for further follow-up of the amputation.  I have her scheduled to see me again in 6 months for ABIs and duplex for surveillance of her left leg.  Durene CalWells Brabham, MD [facility] Tel [phone] Pager [phone]

## 2016-04-09 NOTE — Telephone Encounter (Signed)
I called the patient's daughter and left a voicemail message regarding her mother's appt with Dr.Duda on 04/10/16 at 9:45am for staple removal.  awt

## 2016-04-10 ENCOUNTER — Other Ambulatory Visit: Payer: Self-pay | Admitting: Internal Medicine

## 2016-04-11 ENCOUNTER — Other Ambulatory Visit: Payer: Self-pay | Admitting: Internal Medicine

## 2016-04-11 DIAGNOSIS — Z7984 Long term (current) use of oral hypoglycemic drugs: Secondary | ICD-10-CM | POA: Diagnosis not present

## 2016-04-11 DIAGNOSIS — Z452 Encounter for adjustment and management of vascular access device: Secondary | ICD-10-CM | POA: Diagnosis not present

## 2016-04-11 DIAGNOSIS — E1151 Type 2 diabetes mellitus with diabetic peripheral angiopathy without gangrene: Secondary | ICD-10-CM | POA: Diagnosis not present

## 2016-04-11 DIAGNOSIS — E0500 Thyrotoxicosis with diffuse goiter without thyrotoxic crisis or storm: Secondary | ICD-10-CM | POA: Diagnosis not present

## 2016-04-11 DIAGNOSIS — Z7902 Long term (current) use of antithrombotics/antiplatelets: Secondary | ICD-10-CM | POA: Diagnosis not present

## 2016-04-11 DIAGNOSIS — M301 Polyarteritis with lung involvement [Churg-Strauss]: Secondary | ICD-10-CM | POA: Diagnosis not present

## 2016-04-11 DIAGNOSIS — I4891 Unspecified atrial fibrillation: Secondary | ICD-10-CM | POA: Diagnosis not present

## 2016-04-11 DIAGNOSIS — I509 Heart failure, unspecified: Secondary | ICD-10-CM | POA: Diagnosis not present

## 2016-04-11 DIAGNOSIS — L89610 Pressure ulcer of right heel, unstageable: Secondary | ICD-10-CM | POA: Diagnosis not present

## 2016-04-11 DIAGNOSIS — Z5181 Encounter for therapeutic drug level monitoring: Secondary | ICD-10-CM | POA: Diagnosis not present

## 2016-04-11 DIAGNOSIS — S91101D Unspecified open wound of right great toe without damage to nail, subsequent encounter: Secondary | ICD-10-CM | POA: Diagnosis not present

## 2016-04-11 DIAGNOSIS — M19012 Primary osteoarthritis, left shoulder: Secondary | ICD-10-CM | POA: Diagnosis not present

## 2016-04-11 DIAGNOSIS — I110 Hypertensive heart disease with heart failure: Secondary | ICD-10-CM | POA: Diagnosis not present

## 2016-04-11 DIAGNOSIS — Z9981 Dependence on supplemental oxygen: Secondary | ICD-10-CM | POA: Diagnosis not present

## 2016-04-11 DIAGNOSIS — I272 Other secondary pulmonary hypertension: Secondary | ICD-10-CM | POA: Diagnosis not present

## 2016-04-11 DIAGNOSIS — J45909 Unspecified asthma, uncomplicated: Secondary | ICD-10-CM | POA: Diagnosis not present

## 2016-04-11 DIAGNOSIS — M86171 Other acute osteomyelitis, right ankle and foot: Secondary | ICD-10-CM | POA: Diagnosis not present

## 2016-04-11 DIAGNOSIS — Z792 Long term (current) use of antibiotics: Secondary | ICD-10-CM | POA: Diagnosis not present

## 2016-04-11 DIAGNOSIS — M810 Age-related osteoporosis without current pathological fracture: Secondary | ICD-10-CM | POA: Diagnosis not present

## 2016-04-14 ENCOUNTER — Other Ambulatory Visit: Payer: Self-pay | Admitting: Surgery

## 2016-04-14 DIAGNOSIS — I739 Peripheral vascular disease, unspecified: Secondary | ICD-10-CM

## 2016-04-14 DIAGNOSIS — I872 Venous insufficiency (chronic) (peripheral): Secondary | ICD-10-CM

## 2016-04-14 DIAGNOSIS — I700 Atherosclerosis of aorta: Secondary | ICD-10-CM

## 2016-04-15 DIAGNOSIS — I4891 Unspecified atrial fibrillation: Secondary | ICD-10-CM | POA: Diagnosis not present

## 2016-04-15 DIAGNOSIS — E1151 Type 2 diabetes mellitus with diabetic peripheral angiopathy without gangrene: Secondary | ICD-10-CM | POA: Diagnosis not present

## 2016-04-15 DIAGNOSIS — I110 Hypertensive heart disease with heart failure: Secondary | ICD-10-CM | POA: Diagnosis not present

## 2016-04-15 DIAGNOSIS — Z792 Long term (current) use of antibiotics: Secondary | ICD-10-CM | POA: Diagnosis not present

## 2016-04-15 DIAGNOSIS — Z7984 Long term (current) use of oral hypoglycemic drugs: Secondary | ICD-10-CM | POA: Diagnosis not present

## 2016-04-15 DIAGNOSIS — M19012 Primary osteoarthritis, left shoulder: Secondary | ICD-10-CM | POA: Diagnosis not present

## 2016-04-15 DIAGNOSIS — M86171 Other acute osteomyelitis, right ankle and foot: Secondary | ICD-10-CM | POA: Diagnosis not present

## 2016-04-15 DIAGNOSIS — I509 Heart failure, unspecified: Secondary | ICD-10-CM | POA: Diagnosis not present

## 2016-04-15 DIAGNOSIS — Z5181 Encounter for therapeutic drug level monitoring: Secondary | ICD-10-CM | POA: Diagnosis not present

## 2016-04-15 DIAGNOSIS — S91101D Unspecified open wound of right great toe without damage to nail, subsequent encounter: Secondary | ICD-10-CM | POA: Diagnosis not present

## 2016-04-15 DIAGNOSIS — Z9981 Dependence on supplemental oxygen: Secondary | ICD-10-CM | POA: Diagnosis not present

## 2016-04-15 DIAGNOSIS — Z452 Encounter for adjustment and management of vascular access device: Secondary | ICD-10-CM | POA: Diagnosis not present

## 2016-04-15 DIAGNOSIS — M301 Polyarteritis with lung involvement [Churg-Strauss]: Secondary | ICD-10-CM | POA: Diagnosis not present

## 2016-04-15 DIAGNOSIS — E0500 Thyrotoxicosis with diffuse goiter without thyrotoxic crisis or storm: Secondary | ICD-10-CM | POA: Diagnosis not present

## 2016-04-15 DIAGNOSIS — M810 Age-related osteoporosis without current pathological fracture: Secondary | ICD-10-CM | POA: Diagnosis not present

## 2016-04-15 DIAGNOSIS — Z7902 Long term (current) use of antithrombotics/antiplatelets: Secondary | ICD-10-CM | POA: Diagnosis not present

## 2016-04-15 DIAGNOSIS — J45909 Unspecified asthma, uncomplicated: Secondary | ICD-10-CM | POA: Diagnosis not present

## 2016-04-15 DIAGNOSIS — I272 Other secondary pulmonary hypertension: Secondary | ICD-10-CM | POA: Diagnosis not present

## 2016-04-15 DIAGNOSIS — L89610 Pressure ulcer of right heel, unstageable: Secondary | ICD-10-CM | POA: Diagnosis not present

## 2016-04-17 DIAGNOSIS — I272 Other secondary pulmonary hypertension: Secondary | ICD-10-CM | POA: Diagnosis not present

## 2016-04-17 DIAGNOSIS — Z4781 Encounter for orthopedic aftercare following surgical amputation: Secondary | ICD-10-CM | POA: Diagnosis not present

## 2016-04-17 DIAGNOSIS — E0500 Thyrotoxicosis with diffuse goiter without thyrotoxic crisis or storm: Secondary | ICD-10-CM | POA: Diagnosis not present

## 2016-04-17 DIAGNOSIS — I5042 Chronic combined systolic (congestive) and diastolic (congestive) heart failure: Secondary | ICD-10-CM | POA: Diagnosis not present

## 2016-04-17 DIAGNOSIS — M301 Polyarteritis with lung involvement [Churg-Strauss]: Secondary | ICD-10-CM | POA: Diagnosis not present

## 2016-04-17 DIAGNOSIS — M19012 Primary osteoarthritis, left shoulder: Secondary | ICD-10-CM | POA: Diagnosis not present

## 2016-04-17 DIAGNOSIS — M810 Age-related osteoporosis without current pathological fracture: Secondary | ICD-10-CM | POA: Diagnosis not present

## 2016-04-17 DIAGNOSIS — I110 Hypertensive heart disease with heart failure: Secondary | ICD-10-CM | POA: Diagnosis not present

## 2016-04-17 DIAGNOSIS — Z5181 Encounter for therapeutic drug level monitoring: Secondary | ICD-10-CM | POA: Diagnosis not present

## 2016-04-17 DIAGNOSIS — E1151 Type 2 diabetes mellitus with diabetic peripheral angiopathy without gangrene: Secondary | ICD-10-CM | POA: Diagnosis not present

## 2016-04-17 DIAGNOSIS — Z7902 Long term (current) use of antithrombotics/antiplatelets: Secondary | ICD-10-CM | POA: Diagnosis not present

## 2016-04-17 DIAGNOSIS — Z7984 Long term (current) use of oral hypoglycemic drugs: Secondary | ICD-10-CM | POA: Diagnosis not present

## 2016-04-17 DIAGNOSIS — Z89421 Acquired absence of other right toe(s): Secondary | ICD-10-CM | POA: Diagnosis not present

## 2016-04-17 DIAGNOSIS — Z9981 Dependence on supplemental oxygen: Secondary | ICD-10-CM | POA: Diagnosis not present

## 2016-04-17 DIAGNOSIS — J45909 Unspecified asthma, uncomplicated: Secondary | ICD-10-CM | POA: Diagnosis not present

## 2016-04-17 DIAGNOSIS — I4891 Unspecified atrial fibrillation: Secondary | ICD-10-CM | POA: Diagnosis not present

## 2016-04-20 ENCOUNTER — Telehealth: Payer: Self-pay | Admitting: Internal Medicine

## 2016-04-20 MED ORDER — ONDANSETRON HCL 4 MG PO TABS: 4.0000 mg | ORAL_TABLET | Freq: Three times a day (TID) | ORAL | 0 refills | Status: AC | PRN

## 2016-04-20 NOTE — Telephone Encounter (Signed)
   Reason for call:   I received a call from [name]'s daughter at 0011 AM indicating she was having ongoing nausea and high blood pressure up to a maximum of 174/99.   Pertinent Data:   She was feeling in fair health since hospital discharge last month but for the past 3 days has been sick to her stomach and not tolerating much by mouth. She has started to drink water again yesterday and some soup today. However she continues to feel nauseated without significant pain, nausea, or diarrhea.  She denies any headache, chest pain, or vision changes. She does have some shortness of breath which is chronic and improved with inhaler therapy. She missed taking some medications during this illness but started taking them again this morning after noting hypertension.   Assessment / Plan / Recommendations:   I will prescribe a short course of Zofran 4mg  q8hrs PRN for her nausea to improve her ability to stay hydrated over the next few days  I recommended that her hypertension is a problem but may be worsened by acute illness and trouble taking all her medications. I recommend scheduling a clinic appointment as soon as possible (04/22/16) for evaluation. Her hypertension is moderately high and asymptomatic at this time.  As always, pt is advised that if symptoms worsen or new symptoms arise, they should go to an urgent care facility or to to ER for further evaluation.   Fuller Plan, MD   04/20/2016, 12:27 AM

## 2016-04-23 ENCOUNTER — Ambulatory Visit (INDEPENDENT_AMBULATORY_CARE_PROVIDER_SITE_OTHER): Payer: Medicare Other | Admitting: Internal Medicine

## 2016-04-23 VITALS — BP 131/87 | HR 116 | Temp 98.8°F | Ht 67.0 in | Wt 140.0 lb

## 2016-04-23 DIAGNOSIS — E0800 Diabetes mellitus due to underlying condition with hyperosmolarity without nonketotic hyperglycemic-hyperosmolar coma (NKHHC): Secondary | ICD-10-CM

## 2016-04-23 DIAGNOSIS — Z89511 Acquired absence of right leg below knee: Secondary | ICD-10-CM

## 2016-04-23 DIAGNOSIS — T879 Unspecified complications of amputation stump: Secondary | ICD-10-CM

## 2016-04-23 DIAGNOSIS — M79661 Pain in right lower leg: Secondary | ICD-10-CM

## 2016-04-23 DIAGNOSIS — L298 Other pruritus: Secondary | ICD-10-CM

## 2016-04-23 DIAGNOSIS — L299 Pruritus, unspecified: Secondary | ICD-10-CM

## 2016-04-23 DIAGNOSIS — T8789 Other complications of amputation stump: Secondary | ICD-10-CM

## 2016-04-23 LAB — GLUCOSE, CAPILLARY: Glucose-Capillary: 120 mg/dL — ABNORMAL HIGH (ref 65–99)

## 2016-04-23 MED ORDER — TRAMADOL HCL 50 MG PO TABS: 50.0000 mg | ORAL_TABLET | Freq: Four times a day (QID) | ORAL | 0 refills | Status: AC | PRN

## 2016-04-23 NOTE — Assessment & Plan Note (Signed)
She c/o itching around her wound, I told her that might be due to healing precess. She should not touch her open part of wound and use  Some Zyrtec if itching becomes bad.

## 2016-04-23 NOTE — Progress Notes (Signed)
   CC: Painful right below knee amputation stump.  HPI:  [name] is a 82 y.o. with PMH listed below, presented to the clinic today with pain in her right below knee amputation stump. She had her surgery done by Dr. Lajoyce Corners on August 4. She was seen and has office on  August 24 and her staples were removed. She was told that this pain is the part of her healing process and it will get better on its own. She complains today that she cannot sleep because of that pain. She is a poor historian, accompanied with her son, he he was the one giving [place] most of the history. She also complains of some itching at the wound. She called [place] on September 3 with complaint of nausea and vomiting and was given Zofran for that. She states that has been resolved.  Past Medical History:  Diagnosis Date  . Asthma   . Atrial fibrillation, permanent (HCC)    a. Dx 04/2013, on metoprolol, dig and xarelto  . Cataract    left eye  . CHF (congestive heart failure) (HCC)   . Chronic combined systolic and diastolic heart failure (HCC)    a. 02/2012 EF:20-25%  . Churg-Strauss syndrome (HCC)    a. Sural nerve biopsy 02/2000.  . Ectopic atrial tachycardia (HCC)    a. Documented on tele as IP 02/2012.  . Glaucoma   . Grave's disease    a. s/p thyroidectomy.  . Hypercholesteremia   . Hypertension   . Hypokalemia    a. Previously felt due to diuretics, req supplementation.  . Lipoma    left inner thigh  . Multifocal atrial tachycardia (HCC)    a. Documented as OP 01/2012.  Marland Kitchen NSVT (nonsustained ventricular tachycardia) (HCC)    a. During CHF adm 2001.  . On home oxygen therapy    "wear it mostly at night" (09/20/2014)  . Osteoarthritis    left shoulder  . Osteopenia    DEXA 2004  . Pericardial effusion    a. Echo 02/2012: small-mod pericardial effusion.  . Pleural effusion, left    a. Thoracentesis 2001 - per notes, no malignant cells, was transudative.  . Pneumonia 1930's  . Pseudogout   . Pulmonary HTN (HCC)     a. Mod by echo 02/2012.  Marland Kitchen Shortness of breath   . Type II diabetes mellitus (HCC)   . Valvular heart disease    a. Echo 02/2012: mod MR/TR, mild AI.  Marland Kitchen VASCULITIS   . Venous insufficiency     Review of Systems:  AS per HPI.  Physical Exam:  Vitals:   04/23/16 1520  BP: 131/87  Pulse: (!) 116  Temp: 98.8 F (37.1 C)  TempSrc: Oral  SpO2: 95%  Weight: 140 lb (63.5 kg)  Height: 5\' 7"  (1.702 m)   General:Fragile, lean old lady, NAD. Extremities.  Patient has right BKA.   There is an area on the anterolateral side which has some skin separation and there is some bloody discharge.The superior portion of that open wound has some yellowish granulation tissue and inferior margins looks healthy.There is also drainage from the posterior medial aspect of the incision, it looks like a small hole there. Lungs. Clear bilaterally. CV. Tachycardia, regular rhythm.   Assessment & Plan:   See Encounters Tab for problem based charting.  Patient seen with Dr. Rogelia Boga

## 2016-04-23 NOTE — Patient Instructions (Addendum)
It was pleasure taking care of you today. I am sorry your wound is hurting you. It does not look infected today, but do need some wound care for proper healing. We did a new dressing today, this can stay up to one week. Please do not take off this dressing, I am referring you for home health nurses to come and take care of your wound while we wait to get an appointment at wound care clinic. I am giving you some Tramadol for pain, try not to take more then 3 times a day. please call us back if your pain gets worse or if you have any other symptoms.

## 2016-04-23 NOTE — Assessment & Plan Note (Signed)
She presented to the clinic today with pain in her right below knee amputation stump. She had her surgery done by Dr. Lajoyce Corners on August 4. She was seen and has office on  August 24 and her staples were removed. She was told that this pain is the part of her healing process and it will get better on its own. She complains today that she cannot sleep because of that pain.  Her daughter was doing the dressing at home. ON exam, There is an area on the anterolateral side which has some skin separation and there is some bloody discharge.The superior portion of that open wound has some yellowish granulation tissue and inferior margins looks healthy.There is also drainage from the posterior medial aspect of the incision, it looks like a small hole there.  Plan. WE did her wound dressing with Silicone gel adhesive hydrocellular foam on her open wound with calcium Alginate wound dressing all over the stump area.This can last for one week. -Give her referral for home health nurses for wound care and change of dressing. -Referral for wound clinic. -Tramadol for pain  Home health nurses will take care of wound till she gets an appointment at wound clinic as it takes about 2-3 wks.

## 2016-04-24 ENCOUNTER — Telehealth: Payer: Self-pay | Admitting: Licensed Clinical Social Worker

## 2016-04-24 NOTE — Telephone Encounter (Signed)
CSW placed called to pt.  CSW left message requesting return call. CSW provided contact hours and phone number. 

## 2016-04-25 LAB — POCT GLYCOSYLATED HEMOGLOBIN (HGB A1C): Hemoglobin A1C: 6.2

## 2016-04-25 NOTE — Progress Notes (Signed)
Internal Medicine Clinic Attending  I saw and evaluated the patient.  I personally confirmed the key portions of the history and exam documented by Dr. Amin and I reviewed pertinent patient test results.  The assessment, diagnosis, and plan were formulated together and I agree with the documentation in the resident's note. 

## 2016-04-26 ENCOUNTER — Telehealth: Payer: Self-pay | Admitting: Internal Medicine

## 2016-04-26 DIAGNOSIS — I509 Heart failure, unspecified: Secondary | ICD-10-CM | POA: Diagnosis not present

## 2016-04-26 NOTE — Telephone Encounter (Signed)
INTERNAL MEDICINE RESIDENCY PROGRAM After-Hours Telephone Call    Reason for call:   I received a call from Ms. Yameli Baiza's daughter on 04/26/2016 at 10:10 AM indicating that she cannot tolerate the smell of her mother's wound dressing that was placed in clinic on 04/23/16.     Pertinent Data:   Pt was seen in clinic on 9/6 for RLE wound.  A calcium alginate dressing was applied which is likely the source of the smell daughter is referring to.   daughter is wondering when a home health aid will be by to help change dressing, I confirmed the referral was placed and that I cannot tell when they will be over to her house as it is the weekend and SW has not left a note regarding it.   Pt has not been more confused than normal, she has not had a fever, and there is no purulent drainage coming from wound    Assessment / Plan / Recommendations:   Daughter just cannot tolerate the smell from the dressing and would like to change the dressing herself. I advised her to leave it on and explained the benefits of the algae dressing. She says that she is just going to do what she has been doing, I assume she will change the dressing herself. I also told her to go to the ED for evaluation if the wound has worsened.   Will send front desk and Edson Snowball a message regarding HH aide and to call pt Monday.   As always, pt is advised that if symptoms worsen or new symptoms arise, they should go to an urgent care facility or to to ER for further evaluation.    Denton Brick, MD   04/26/2016, 10:10 AM

## 2016-04-28 ENCOUNTER — Telehealth: Payer: Self-pay | Admitting: *Deleted

## 2016-04-28 NOTE — Telephone Encounter (Signed)
CSW placed call to Ms. Gilson's home number. Pt not home at this time, grand-daughter answered phone and states pt is at an appointment.  Grand-daughter confirms this phone number is best contact number to reach pt/family. Pt has used [facility] in the past.  Referral faxed to [facility].

## 2016-04-28 NOTE — Telephone Encounter (Signed)
CSW returned call to Ms. [name], pt's daughter.  Daughter agreeable to referral to [facility] as they have used them in the past.  CSW informed Ms. Marlyne Beards CSW attempted to contact on 04/24/16 and left messages at both numbers on pt's chart.  Ms. Marlyne Beards would like [facility] to contact her to inform appointment date and time.  CSW will place on referral to contact Ms. Marlyne Beards.

## 2016-04-28 NOTE — Telephone Encounter (Signed)
Call from pt's daughter- states pt saw Dr. Lajoyce Corners, she has an infection and will have surgery. States no need for SW now. Daughter upset has to have sugery.

## 2016-04-28 NOTE — Addendum Note (Signed)
Addended by: Neomia Dear on: 04/28/2016 05:59 PM   Modules accepted: Orders

## 2016-04-28 NOTE — Telephone Encounter (Signed)
CSW received two voice mails from Ms. [name] inquiring [facility] for patient's [facility] services.  Ms. Marlyne Beards provided contact numbers [phone], [phone].  CSW attempt to contact Ms. Marlyne Beards at first number provided unable leave message.  Message left at second number provided, requesting agency of choice and confirmation of pt's primary insurance.  Pt's EMR indicates [facility] policy is elapsed.

## 2016-04-28 NOTE — Telephone Encounter (Signed)
Call from pt's dtr-she is a little concerned that home health had not contacted pt regarding wound.  Dtr states that she had contacted the on-call doctor over the weekend, but she was able to inform her to when home health will be out.  States pt wound had a foul odor and dressing was saturated..CSW contacted patient with info regarding referral, but in the interim-pt's dtr has already scheduled pt to have surgery with DrDuda..kg

## 2016-04-29 ENCOUNTER — Encounter (HOSPITAL_COMMUNITY): Payer: Self-pay | Admitting: *Deleted

## 2016-04-29 ENCOUNTER — Other Ambulatory Visit (HOSPITAL_COMMUNITY): Payer: Self-pay | Admitting: Family

## 2016-04-29 NOTE — Progress Notes (Signed)
Rica Mast, NP reviewed patient's history, no new orders.

## 2016-04-29 NOTE — Progress Notes (Addendum)
Instructions given to patients daughter, [name]: I instructed patient to check CBG to check CBG and if it is less than 70 to treat it with Glucose Gel, Glucose tablets or 1/2 cup of clear juice like apple juice or cranberry juice, or 1/2 cup of regular soda. (not cream soda). I instructed patient to recheck CBG in 15 minutes and if CBG is not greater than 70, to  Call [phone] (pre- op). If it is before pre-op opens to retreat as before and recheck CBG in 15 minutes. I told patient to make note of time that liquid is taken and amount, that surgical time may have to be adjusted.   I encouraged [name] to have patient take a snack prior to bedtime.  [name] said she would give Mrs Stiles a Boost.

## 2016-04-30 ENCOUNTER — Inpatient Hospital Stay (HOSPITAL_COMMUNITY): Payer: Medicare Other | Admitting: Anesthesiology

## 2016-04-30 ENCOUNTER — Encounter (HOSPITAL_COMMUNITY)
Admission: RE | Disposition: A | Discharge status: Home / Self Care | Payer: Self-pay | Source: Ambulatory Visit | Attending: Orthopedic Surgery

## 2016-04-30 ENCOUNTER — Encounter (HOSPITAL_COMMUNITY): Payer: Self-pay | Admitting: *Deleted

## 2016-04-30 ENCOUNTER — Ambulatory Visit (HOSPITAL_COMMUNITY)
Admission: RE | Admit: 2016-04-30 | Discharge: 2016-05-01 | Disposition: A | Discharge status: Home / Self Care | Payer: Medicare Other | Source: Ambulatory Visit | Attending: Orthopedic Surgery | Admitting: Orthopedic Surgery

## 2016-04-30 DIAGNOSIS — J45909 Unspecified asthma, uncomplicated: Secondary | ICD-10-CM | POA: Diagnosis not present

## 2016-04-30 DIAGNOSIS — E1151 Type 2 diabetes mellitus with diabetic peripheral angiopathy without gangrene: Secondary | ICD-10-CM | POA: Insufficient documentation

## 2016-04-30 DIAGNOSIS — T8751 Necrosis of amputation stump, right upper extremity: Secondary | ICD-10-CM | POA: Diagnosis not present

## 2016-04-30 DIAGNOSIS — T8781 Dehiscence of amputation stump: Principal | ICD-10-CM | POA: Insufficient documentation

## 2016-04-30 DIAGNOSIS — I272 Other secondary pulmonary hypertension: Secondary | ICD-10-CM | POA: Insufficient documentation

## 2016-04-30 DIAGNOSIS — I482 Chronic atrial fibrillation: Secondary | ICD-10-CM | POA: Diagnosis not present

## 2016-04-30 DIAGNOSIS — Z89512 Acquired absence of left leg below knee: Secondary | ICD-10-CM

## 2016-04-30 DIAGNOSIS — Z79899 Other long term (current) drug therapy: Secondary | ICD-10-CM | POA: Insufficient documentation

## 2016-04-30 DIAGNOSIS — Z7901 Long term (current) use of anticoagulants: Secondary | ICD-10-CM | POA: Insufficient documentation

## 2016-04-30 DIAGNOSIS — I110 Hypertensive heart disease with heart failure: Secondary | ICD-10-CM | POA: Insufficient documentation

## 2016-04-30 DIAGNOSIS — Z89511 Acquired absence of right leg below knee: Secondary | ICD-10-CM | POA: Insufficient documentation

## 2016-04-30 DIAGNOSIS — I70261 Atherosclerosis of native arteries of extremities with gangrene, right leg: Secondary | ICD-10-CM | POA: Diagnosis not present

## 2016-04-30 DIAGNOSIS — I509 Heart failure, unspecified: Secondary | ICD-10-CM | POA: Diagnosis not present

## 2016-04-30 DIAGNOSIS — I4891 Unspecified atrial fibrillation: Secondary | ICD-10-CM | POA: Diagnosis not present

## 2016-04-30 DIAGNOSIS — M86271 Subacute osteomyelitis, right ankle and foot: Secondary | ICD-10-CM | POA: Diagnosis not present

## 2016-04-30 DIAGNOSIS — H409 Unspecified glaucoma: Secondary | ICD-10-CM | POA: Diagnosis not present

## 2016-04-30 DIAGNOSIS — I5042 Chronic combined systolic (congestive) and diastolic (congestive) heart failure: Secondary | ICD-10-CM | POA: Insufficient documentation

## 2016-04-30 DIAGNOSIS — D649 Anemia, unspecified: Secondary | ICD-10-CM | POA: Insufficient documentation

## 2016-04-30 DIAGNOSIS — Z9981 Dependence on supplemental oxygen: Secondary | ICD-10-CM | POA: Insufficient documentation

## 2016-04-30 DIAGNOSIS — M301 Polyarteritis with lung involvement [Churg-Strauss]: Secondary | ICD-10-CM | POA: Insufficient documentation

## 2016-04-30 DIAGNOSIS — IMO0002 Reserved for concepts with insufficient information to code with codable children: Secondary | ICD-10-CM

## 2016-04-30 DIAGNOSIS — E1142 Type 2 diabetes mellitus with diabetic polyneuropathy: Secondary | ICD-10-CM | POA: Diagnosis not present

## 2016-04-30 DIAGNOSIS — K219 Gastro-esophageal reflux disease without esophagitis: Secondary | ICD-10-CM | POA: Insufficient documentation

## 2016-04-30 DIAGNOSIS — Y835 Amputation of limb(s) as the cause of abnormal reaction of the patient, or of later complication, without mention of misadventure at the time of the procedure: Secondary | ICD-10-CM | POA: Insufficient documentation

## 2016-04-30 DIAGNOSIS — Z7984 Long term (current) use of oral hypoglycemic drugs: Secondary | ICD-10-CM | POA: Insufficient documentation

## 2016-04-30 HISTORY — PX: STUMP REVISION: SHX6102

## 2016-04-30 HISTORY — PX: AMPUTATION: SHX166

## 2016-04-30 LAB — BASIC METABOLIC PANEL
Anion gap: 12 (ref 5–15)
BUN: 10 mg/dL (ref 6–20)
CALCIUM: 8.8 mg/dL — AB (ref 8.9–10.3)
CO2: 24 mmol/L (ref 22–32)
CREATININE: 0.75 mg/dL (ref 0.44–1.00)
Chloride: 100 mmol/L — ABNORMAL LOW (ref 101–111)
GFR calc non Af Amer: >60 mL/min (ref >60)
Glucose, Bld: 118 mg/dL — ABNORMAL HIGH (ref 65–99)
Potassium: 4.8 mmol/L (ref 3.5–5.1)
SODIUM: 136 mmol/L (ref 135–145)

## 2016-04-30 LAB — GLUCOSE, CAPILLARY
GLUCOSE-CAPILLARY: 104 mg/dL — AB (ref 65–99)
GLUCOSE-CAPILLARY: 127 mg/dL — AB (ref 65–99)
Glucose-Capillary: 123 mg/dL — ABNORMAL HIGH (ref 65–99)
Glucose-Capillary: 141 mg/dL — ABNORMAL HIGH (ref 65–99)

## 2016-04-30 LAB — CBC
HCT: 40.9 % (ref 36.0–46.0)
Hemoglobin: 12.1 g/dL (ref 12.0–15.0)
MCH: 29.7 pg (ref 26.0–34.0)
MCHC: 29.6 g/dL — ABNORMAL LOW (ref 30.0–36.0)
MCV: 100.5 fL — ABNORMAL HIGH (ref 78.0–100.0)
PLATELETS: 327 10*3/uL (ref 150–400)
RBC: 4.07 MIL/uL (ref 3.87–5.11)
RDW: 14.6 % (ref 11.5–15.5)
WBC: 5.9 10*3/uL (ref 4.0–10.5)

## 2016-04-30 SURGERY — REVISION, AMPUTATION SITE
Anesthesia: Monitor Anesthesia Care | Laterality: Right

## 2016-04-30 MED ORDER — ACETAMINOPHEN 325 MG PO TABS
650.0000 mg | ORAL_TABLET | Freq: Four times a day (QID) | ORAL | Status: DC | PRN
  Administered 2016-05-01 (×2): 650 mg via ORAL
  Filled 2016-04-30 (×2): qty 2

## 2016-04-30 MED ORDER — BRIMONIDINE TARTRATE 0.15 % OP SOLN
1.0000 [drp] | Freq: Two times a day (BID) | OPHTHALMIC | Status: DC
  Administered 2016-04-30 – 2016-05-01 (×2): 1 [drp] via OPHTHALMIC
  Filled 2016-04-30: qty 5

## 2016-04-30 MED ORDER — MAGNESIUM CITRATE PO SOLN: 1.0000 | Freq: Once | ORAL | Status: DC | PRN

## 2016-04-30 MED ORDER — SPIRONOLACTONE 25 MG PO TABS
25.0000 mg | ORAL_TABLET | Freq: Every day | ORAL | Status: DC
  Administered 2016-04-30 – 2016-05-01 (×2): 25 mg via ORAL
  Filled 2016-04-30 (×2): qty 1

## 2016-04-30 MED ORDER — SODIUM CHLORIDE 0.9 % IV SOLN
INTRAVENOUS | Status: DC | PRN
  Administered 2016-04-30: 40 ug via INTRAVENOUS
  Administered 2016-04-30 (×2): 80 ug via INTRAVENOUS

## 2016-04-30 MED ORDER — METHOCARBAMOL 500 MG PO TABS: 500.0000 mg | ORAL_TABLET | Freq: Four times a day (QID) | ORAL | Status: DC | PRN

## 2016-04-30 MED ORDER — FENTANYL CITRATE (PF) 100 MCG/2ML IJ SOLN
INTRAMUSCULAR | Status: AC
  Filled 2016-04-30: qty 2

## 2016-04-30 MED ORDER — CEFAZOLIN IN D5W 1 GM/50ML IV SOLN
1.0000 g | Freq: Four times a day (QID) | INTRAVENOUS | Status: AC
Start: 2016-04-30 — End: 2016-05-01
  Administered 2016-04-30 – 2016-05-01 (×3): 1 g via INTRAVENOUS
  Filled 2016-04-30 (×3): qty 50

## 2016-04-30 MED ORDER — METOCLOPRAMIDE HCL 5 MG/ML IJ SOLN: 5.0000 mg | Freq: Three times a day (TID) | INTRAMUSCULAR | Status: DC | PRN

## 2016-04-30 MED ORDER — LISINOPRIL 10 MG PO TABS
10.0000 mg | ORAL_TABLET | Freq: Every day | ORAL | Status: DC
  Administered 2016-04-30 – 2016-05-01 (×2): 10 mg via ORAL
  Filled 2016-04-30 (×2): qty 1

## 2016-04-30 MED ORDER — 0.9 % SODIUM CHLORIDE (POUR BTL) OPTIME
TOPICAL | Status: DC | PRN
  Administered 2016-04-30: 1000 mL

## 2016-04-30 MED ORDER — CHLORHEXIDINE GLUCONATE 4 % EX LIQD: 60.0000 mL | Freq: Once | CUTANEOUS | Status: DC

## 2016-04-30 MED ORDER — RIVAROXABAN 15 MG PO TABS
15.0000 mg | ORAL_TABLET | Freq: Every day | ORAL | Status: DC
  Administered 2016-04-30 – 2016-05-01 (×2): 15 mg via ORAL
  Filled 2016-04-30 (×2): qty 1

## 2016-04-30 MED ORDER — CEFAZOLIN SODIUM-DEXTROSE 2-4 GM/100ML-% IV SOLN
2.0000 g | INTRAVENOUS | Status: AC
  Administered 2016-04-30: 2 g via INTRAVENOUS
  Filled 2016-04-30: qty 100

## 2016-04-30 MED ORDER — HYDROMORPHONE HCL 1 MG/ML IJ SOLN: 1.0000 mg | INTRAMUSCULAR | Status: DC | PRN

## 2016-04-30 MED ORDER — LACTATED RINGERS IV SOLN
INTRAVENOUS | Status: DC | PRN
  Administered 2016-04-30: 11:00:00 via INTRAVENOUS

## 2016-04-30 MED ORDER — METOPROLOL SUCCINATE ER 100 MG PO TB24
100.0000 mg | ORAL_TABLET | ORAL | Status: DC
  Administered 2016-05-01: 100 mg via ORAL
  Filled 2016-04-30: qty 1

## 2016-04-30 MED ORDER — ONDANSETRON HCL 4 MG PO TABS: 4.0000 mg | ORAL_TABLET | Freq: Three times a day (TID) | ORAL | Status: DC | PRN

## 2016-04-30 MED ORDER — INSULIN ASPART 100 UNIT/ML ~~LOC~~ SOLN
0.0000 [IU] | Freq: Three times a day (TID) | SUBCUTANEOUS | Status: DC
  Administered 2016-04-30 – 2016-05-01 (×3): 1 [IU] via SUBCUTANEOUS

## 2016-04-30 MED ORDER — PHENYLEPHRINE 40 MCG/ML (10ML) SYRINGE FOR IV PUSH (FOR BLOOD PRESSURE SUPPORT)
PREFILLED_SYRINGE | INTRAVENOUS | Status: AC
  Filled 2016-04-30: qty 10

## 2016-04-30 MED ORDER — FERROUS SULFATE 325 (65 FE) MG PO TABS
325.0000 mg | ORAL_TABLET | Freq: Every day | ORAL | Status: DC
  Administered 2016-05-01: 325 mg via ORAL
  Filled 2016-04-30: qty 1

## 2016-04-30 MED ORDER — ACETAMINOPHEN 650 MG RE SUPP: 650.0000 mg | Freq: Four times a day (QID) | RECTAL | Status: DC | PRN

## 2016-04-30 MED ORDER — PROPOFOL 10 MG/ML IV BOLUS
INTRAVENOUS | Status: DC | PRN
  Administered 2016-04-30 (×2): 25 mg via INTRAVENOUS

## 2016-04-30 MED ORDER — BISACODYL 10 MG RE SUPP: 10.0000 mg | Freq: Every day | RECTAL | Status: DC | PRN

## 2016-04-30 MED ORDER — POLYETHYLENE GLYCOL 3350 17 G PO PACK: 17.0000 g | PACK | Freq: Every day | ORAL | Status: DC | PRN

## 2016-04-30 MED ORDER — MIDAZOLAM HCL 2 MG/2ML IJ SOLN
INTRAMUSCULAR | Status: AC
  Filled 2016-04-30: qty 2

## 2016-04-30 MED ORDER — OXYCODONE HCL 5 MG PO TABS
5.0000 mg | ORAL_TABLET | ORAL | Status: DC | PRN
  Administered 2016-04-30: 10 mg via ORAL
  Administered 2016-05-01: 5 mg via ORAL
  Filled 2016-04-30 (×2): qty 1

## 2016-04-30 MED ORDER — ALBUTEROL SULFATE (2.5 MG/3ML) 0.083% IN NEBU: 3.0000 mL | INHALATION_SOLUTION | Freq: Four times a day (QID) | RESPIRATORY_TRACT | Status: DC | PRN

## 2016-04-30 MED ORDER — FENTANYL CITRATE (PF) 100 MCG/2ML IJ SOLN
INTRAMUSCULAR | Status: DC
Start: 2016-04-30 — End: 2016-04-30
  Filled 2016-04-30: qty 2

## 2016-04-30 MED ORDER — METFORMIN HCL 500 MG PO TABS
1000.0000 mg | ORAL_TABLET | Freq: Two times a day (BID) | ORAL | Status: DC
  Administered 2016-04-30 – 2016-05-01 (×3): 1000 mg via ORAL
  Filled 2016-04-30 (×3): qty 2

## 2016-04-30 MED ORDER — METOPROLOL TARTRATE 5 MG/5ML IV SOLN
5.0000 mg | Freq: Once | INTRAVENOUS | Status: AC
  Administered 2016-04-30: 5 mg via INTRAVENOUS

## 2016-04-30 MED ORDER — PROMETHAZINE HCL 25 MG/ML IJ SOLN: 6.2500 mg | INTRAMUSCULAR | Status: DC | PRN

## 2016-04-30 MED ORDER — FAMOTIDINE 20 MG PO TABS
10.0000 mg | ORAL_TABLET | Freq: Every day | ORAL | Status: DC
  Administered 2016-04-30 – 2016-05-01 (×2): 10 mg via ORAL
  Filled 2016-04-30 (×2): qty 1

## 2016-04-30 MED ORDER — MOMETASONE FURO-FORMOTEROL FUM 100-5 MCG/ACT IN AERO
2.0000 | INHALATION_SPRAY | Freq: Two times a day (BID) | RESPIRATORY_TRACT | Status: DC
  Administered 2016-04-30 – 2016-05-01 (×2): 2 via RESPIRATORY_TRACT
  Filled 2016-04-30: qty 8.8

## 2016-04-30 MED ORDER — PROPOFOL 10 MG/ML IV BOLUS
INTRAVENOUS | Status: AC
  Filled 2016-04-30: qty 20

## 2016-04-30 MED ORDER — OXYCODONE HCL 5 MG PO TABS
ORAL_TABLET | ORAL | Status: AC
  Filled 2016-04-30: qty 2

## 2016-04-30 MED ORDER — FENTANYL CITRATE (PF) 100 MCG/2ML IJ SOLN
INTRAMUSCULAR | Status: DC | PRN
  Administered 2016-04-30: 25 ug via INTRAVENOUS

## 2016-04-30 MED ORDER — METOCLOPRAMIDE HCL 5 MG PO TABS: 5.0000 mg | ORAL_TABLET | Freq: Three times a day (TID) | ORAL | Status: DC | PRN

## 2016-04-30 MED ORDER — FENTANYL CITRATE (PF) 100 MCG/2ML IJ SOLN
25.0000 ug | INTRAMUSCULAR | Status: DC | PRN
  Administered 2016-04-30: 50 ug via INTRAVENOUS
  Administered 2016-04-30 (×4): 25 ug via INTRAVENOUS

## 2016-04-30 MED ORDER — SODIUM CHLORIDE 0.9 % IV SOLN
INTRAVENOUS | Status: DC
  Administered 2016-04-30: 17:00:00 via INTRAVENOUS

## 2016-04-30 MED ORDER — ONDANSETRON HCL 4 MG/2ML IJ SOLN: 4.0000 mg | Freq: Four times a day (QID) | INTRAMUSCULAR | Status: DC | PRN

## 2016-04-30 MED ORDER — METOPROLOL TARTRATE 5 MG/5ML IV SOLN
INTRAVENOUS | Status: AC
  Filled 2016-04-30: qty 5

## 2016-04-30 MED ORDER — SUCCINYLCHOLINE CHLORIDE 200 MG/10ML IV SOSY
PREFILLED_SYRINGE | INTRAVENOUS | Status: AC
  Filled 2016-04-30: qty 10

## 2016-04-30 MED ORDER — METHOCARBAMOL 1000 MG/10ML IJ SOLN
500.0000 mg | Freq: Four times a day (QID) | INTRAVENOUS | Status: DC | PRN
  Filled 2016-04-30: qty 5

## 2016-04-30 MED ORDER — ESMOLOL HCL 100 MG/10ML IV SOLN
INTRAVENOUS | Status: AC
Start: 2016-04-30 — End: 2016-04-30
  Filled 2016-04-30: qty 10

## 2016-04-30 MED ORDER — ESMOLOL HCL 100 MG/10ML IV SOLN
INTRAVENOUS | Status: DC | PRN
  Administered 2016-04-30: 10 mg via INTRAVENOUS
  Administered 2016-04-30: 5 mg via INTRAVENOUS
  Administered 2016-04-30: 10 mg via INTRAVENOUS
  Administered 2016-04-30: 5 mg via INTRAVENOUS
  Administered 2016-04-30: 20 mg via INTRAVENOUS
  Administered 2016-04-30: 10 mg via INTRAVENOUS

## 2016-04-30 MED ORDER — DORZOLAMIDE HCL 2 % OP SOLN
1.0000 [drp] | Freq: Two times a day (BID) | OPHTHALMIC | Status: DC
  Administered 2016-04-30 – 2016-05-01 (×2): 1 [drp] via OPHTHALMIC
  Filled 2016-04-30: qty 10

## 2016-04-30 MED ORDER — ONDANSETRON HCL 4 MG PO TABS: 4.0000 mg | ORAL_TABLET | Freq: Four times a day (QID) | ORAL | Status: DC | PRN

## 2016-04-30 SURGICAL SUPPLY — 40 items
BLADE SAW RECIP 87.9 MT (BLADE) IMPLANT
BLADE SURG 21 STRL SS (BLADE) ×3 IMPLANT
BNDG COHESIVE 6X5 TAN STRL LF (GAUZE/BANDAGES/DRESSINGS) ×1 IMPLANT
BNDG GAUZE ELAST 4 BULKY (GAUZE/BANDAGES/DRESSINGS) IMPLANT
CANISTER SUCTION 1500CC (MISCELLANEOUS) ×2 IMPLANT
COVER SURGICAL LIGHT HANDLE (MISCELLANEOUS) ×3 IMPLANT
DRAPE EXTREMITY T 121X128X90 (DRAPE) ×3 IMPLANT
DRAPE INCISE IOBAN 66X45 STRL (DRAPES) IMPLANT
DRAPE PROXIMA HALF (DRAPES) ×3 IMPLANT
DRAPE U-SHAPE 47X51 STRL (DRAPES) ×4 IMPLANT
DRSG ADAPTIC 3X8 NADH LF (GAUZE/BANDAGES/DRESSINGS) IMPLANT
DRSG VAC ATS LRG SENSATRAC (GAUZE/BANDAGES/DRESSINGS) ×2 IMPLANT
DURAPREP 26ML APPLICATOR (WOUND CARE) ×3 IMPLANT
ELECT REM PT RETURN 9FT ADLT (ELECTROSURGICAL) ×3
ELECTRODE REM PT RTRN 9FT ADLT (ELECTROSURGICAL) ×1 IMPLANT
GAUZE SPONGE 4X4 12PLY STRL (GAUZE/BANDAGES/DRESSINGS) ×1 IMPLANT
GLOVE BIOGEL PI IND STRL 9 (GLOVE) ×1 IMPLANT
GLOVE BIOGEL PI INDICATOR 9 (GLOVE) ×2
GLOVE SURG ORTHO 9.0 STRL STRW (GLOVE) ×3 IMPLANT
GOWN STRL REUS W/ TWL XL LVL3 (GOWN DISPOSABLE) ×2 IMPLANT
GOWN STRL REUS W/TWL XL LVL3 (GOWN DISPOSABLE) ×6
KIT BASIN OR (CUSTOM PROCEDURE TRAY) ×3 IMPLANT
KIT ROOM TURNOVER OR (KITS) ×3 IMPLANT
MANIFOLD NEPTUNE II (INSTRUMENTS) ×1 IMPLANT
NS IRRIG 1000ML POUR BTL (IV SOLUTION) ×3 IMPLANT
PACK GENERAL/GYN (CUSTOM PROCEDURE TRAY) ×3 IMPLANT
PAD ARMBOARD 7.5X6 YLW CONV (MISCELLANEOUS) ×3 IMPLANT
PAD NEG PRESSURE SENSATRAC (MISCELLANEOUS) IMPLANT
PREVENA INCISION MGT 90 150 (MISCELLANEOUS) ×2 IMPLANT
SPONGE LAP 18X18 X RAY DECT (DISPOSABLE) IMPLANT
STAPLER VISISTAT 35W (STAPLE) IMPLANT
STOCKINETTE IMPERVIOUS LG (DRAPES) IMPLANT
SUT ETHILON 2 0 PSLX (SUTURE) ×12 IMPLANT
SUT PDS AB 1 CT  36 (SUTURE)
SUT PDS AB 1 CT 36 (SUTURE) IMPLANT
SUT SILK 2 0 (SUTURE) ×3
SUT SILK 2-0 18XBRD TIE 12 (SUTURE) IMPLANT
SUT VIC AB 1 CTX 36 (SUTURE)
SUT VIC AB 1 CTX36XBRD ANBCTR (SUTURE) IMPLANT
TOWEL OR 17X26 10 PK STRL BLUE (TOWEL DISPOSABLE) ×3 IMPLANT

## 2016-04-30 NOTE — Progress Notes (Signed)
Called Dr Armanda Magic regarding rapid atrial fib despite esmolol given in OR. BP 133/64 Herat rate in the 130's-140's. Order to given 5mg  Metoprolol IV. Will administer and continue to monitor.

## 2016-04-30 NOTE — Telephone Encounter (Signed)
Noted, I see that she is scheduled for surgery today.  Can we make sure that she has an appointment to see me at some point, I still have yet to meet [name] despite being her PCP (she missed our last appointment)

## 2016-04-30 NOTE — Op Note (Signed)
04/30/2016  11:57 AM  PATIENT:  [name]    PRE-OPERATIVE DIAGNOSIS:  Dehiscence Right Below Knee Amputation  POST-OPERATIVE DIAGNOSIS:  Same  PROCEDURE:  Revision Right Below Knee Amputation with application of prevena  SURGEON:  Nadara Mustard, MD  PHYSICIAN ASSISTANT:None ANESTHESIA:   General  PREOPERATIVE INDICATIONS:  [name] is a  82 y.o. female with a diagnosis of Dehiscence Right Below Knee Amputation who failed conservative measures and elected for surgical management.    The risks benefits and alternatives were discussed with the patient preoperatively including but not limited to the risks of infection, bleeding, nerve injury, cardiopulmonary complications, the need for revision surgery, among others, and the patient was willing to proceed.  OPERATIVE IMPLANTS: Prevena wound VAC  OPERATIVE FINDINGS: Ischemic muscle  OPERATIVE PROCEDURE: Patient was brought the operating room and underwent regional anesthetic. After adequate levels anesthesia obtained patient's right lower extremity was prepped using DuraPrep draped into a sterile field a timeout was called. A fishmouth incision was made around the dehisced area of the wound. This was carried down to bone the distal 3 cm of bone of the tibia and fibula were resected. The vascular bundles were suture ligated with 2-0 silk. Electrocautery was used for remainder the hemostasis. The deep superficial fascial layers and skin was closed using 2-0 nylon. A wound VAC incisional was applied. This had a good suction fit patient was taken to the PACU in stable condition. Plan for discharge to home with family as per their request.

## 2016-04-30 NOTE — Telephone Encounter (Signed)
Message sent to front office to schedule pt an appt after surgery.

## 2016-04-30 NOTE — Anesthesia Procedure Notes (Signed)
Procedure Name: MAC Date/Time: 04/30/2016 11:59 AM Performed by: Gwenyth Allegra Pre-anesthesia Checklist: Patient being monitored, Suction available, Emergency Drugs available and Patient identified Patient Re-evaluated:Patient Re-evaluated prior to inductionOxygen Delivery Method: Circle system utilized Preoxygenation: Pre-oxygenation with 100% oxygen Intubation Type: IV induction Ventilation: Mask ventilation without difficulty

## 2016-04-30 NOTE — H&P (Signed)
[name] is an 82 y.o. female.   Chief Complaint: Dehiscence right transtibial amputation HPI: Patient is an 82 year old woman with severe peripheral vascular disease status post transtibial amputation on the right who presents with progressive ischemic changes and has failed conservative wound care.  Past Medical History:  Diagnosis Date  . Asthma   . Atrial fibrillation, permanent (HCC)    a. Dx 04/2013, on metoprolol, dig and xarelto  . Cataract    left eye  . CHF (congestive heart failure) (HCC)   . Chronic combined systolic and diastolic heart failure (HCC)    a. 02/2012 EF:20-25%  . Churg-Strauss syndrome (HCC)    a. Sural nerve biopsy 02/2000.  . Ectopic atrial tachycardia (HCC)    a. Documented on tele as IP 02/2012.  . Glaucoma   . Grave's disease    a. s/p thyroidectomy.  . Hypercholesteremia   . Hypertension   . Hypokalemia    a. Previously felt due to diuretics, req supplementation.  . Lipoma    left inner thigh  . Multifocal atrial tachycardia (HCC)    a. Documented as OP 01/2012.  Marland Kitchen NSVT (nonsustained ventricular tachycardia) (HCC)    a. During CHF adm 2001.  . On home oxygen therapy    "wear it mostly at night" (09/20/2014)  . Osteoarthritis    left shoulder  . Osteopenia    DEXA 2004  . Pericardial effusion    a. Echo 02/2012: small-mod pericardial effusion.  . Pleural effusion, left    a. Thoracentesis 2001 - per notes, no malignant cells, was transudative.  . Pneumonia 1930's  . Pseudogout   . Pulmonary HTN (HCC)    a. Mod by echo 02/2012.  Marland Kitchen Shortness of breath    With extertion  . Type II diabetes mellitus (HCC)   . Valvular heart disease    a. Echo 02/2012: mod MR/TR, mild AI.  Marland Kitchen VASCULITIS   . Venous insufficiency     Past Surgical History:  Procedure Laterality Date  . ABDOMINAL HYSTERECTOMY    . AMPUTATION Right 03/21/2016   Procedure: RIGHT BELOW KNEE AMPUTATION;  Surgeon: Nadara Mustard, MD;  Location: [facility] OR;  Service: Orthopedics;   Laterality: Right;  . BELOW KNEE LEG AMPUTATION Right 03/21/2016  . CATARACT EXTRACTION Right   . FRACTURE SURGERY    . HEMIARTHROPLASTY HIP Right 10/2010   cemented for hip fracture, femoral neck - by Eulas Post, MD  . PERIPHERAL VASCULAR CATHETERIZATION N/A 01/30/2016   Procedure: Abdominal Aortogram w/Lower Extremity;  Surgeon: Nada Libman, MD;  Location: [facility] INVASIVE CV LAB;  Service: Cardiovascular;  Laterality: N/A;  . PERIPHERAL VASCULAR CATHETERIZATION Right 01/30/2016   Procedure: Peripheral Vascular Balloon Angioplasty;  Surgeon: Nada Libman, MD;  Location: [facility] INVASIVE CV LAB;  Service: Cardiovascular;  Laterality: Right;  Posterior tibial  . SALPINGOOPHORECTOMY    . SURAL NERVE BIOPSY  02/2000  . THYROIDECTOMY      Family History  Problem Relation Age of Onset  . Diabetes Mother   . Diabetes Father   . Heart disease Neg Hx    Social History:  reports that she has never smoked. She has never used smokeless tobacco. She reports that she does not drink alcohol or use drugs.  Allergies:  Allergies  Allergen Reactions  . Amiodarone Other (See Comments)    SEVERE PULMONARY TOXICITY PATIENT DEVELOPED HYPOXIA    No prescriptions prior to admission.    No results found for this or any previous  visit (from the past 48 hour(s)). No results found.  Review of Systems  All other systems reviewed and are negative.   There were no vitals taken for this visit. Physical Exam  On examination patient has progressive dehiscence of her transtibial amputation. Assessment/Plan Assessment: Peripheral vascular disease with trans-tibial amputation dehiscence.  Plan: We'll plan for revision of right transtibial amputation risk and benefits were discussed including risk of the wound not healing. Patient states she understands wish to proceed at this time.  Nadara MustardMarcus V Duda, MD 04/30/2016, 7:18 AM

## 2016-04-30 NOTE — Transfer of Care (Signed)
Immediate Anesthesia Transfer of Care Note  Patient: [name]  Procedure(s) Performed: Procedure(s): Revision Right Below Knee Amputation with application of prevena (Right)  Patient Location: PACU  Anesthesia Type:General  Level of Consciousness: awake and alert   Airway & Oxygen Therapy: Patient Spontanous Breathing and Patient connected to face mask oxygen  Post-op Assessment: Report given to RN and Post -op Vital signs reviewed and stable  Post vital signs: Reviewed and stable  Last Vitals:  Vitals:   04/30/16 0847  BP: 114/62  Pulse: (!) 104  Resp: 18  Temp: 36.5 C    Last Pain: There were no vitals filed for this visit.    Patients Stated Pain Goal: 2 (04/30/16 1478)  Complications: No apparent anesthesia complications

## 2016-04-30 NOTE — Anesthesia Preprocedure Evaluation (Addendum)
Anesthesia Evaluation  Patient identified by MRN, date of birth, ID band Patient awake and Patient unresponsive    Reviewed: Allergy & Precautions, NPO status , Patient's Chart, lab work & pertinent test results  Airway Mallampati: II  TM Distance: >3 FB Neck ROM: Full    Dental  (+) Edentulous Lower, Edentulous Upper   Pulmonary shortness of breath, asthma , pneumonia, resolved,    Pulmonary exam normal breath sounds clear to auscultation       Cardiovascular hypertension, Pt. on medications + Peripheral Vascular Disease and +CHF  Normal cardiovascular exam Rhythm:Regular Rate:Normal  ECHO 01-29-16:  Study Conclusions  - Left ventricle: The cavity size was normal. Wall thickness was   increased in a pattern of mild LVH. Systolic function was   severely reduced. The estimated ejection fraction was in the   range of 25% to 30%. Diffuse hypokinesis. There is akinesis of   the basal-midinferoseptal myocardium. - Aortic valve: Trileaflet; mildly thickened, mildly calcified   leaflets. There was mild regurgitation. - Mitral valve: There was moderate regurgitation. - Left atrium: The atrium was severely dilated. Volume/bsa, S: 65.7   ml/m^2. - Right ventricle: The cavity size was mildly dilated. Wall   thickness was normal. Systolic function was moderately reduced. - Right atrium: The atrium was moderately dilated. - Tricuspid valve: There was severe regurgitation. - Pulmonary arteries: Systolic pressure was moderately increased.   PA peak pressure: 60 mm Hg (S). - Pericardium, extracardiac: A small pericardial effusion was   identified posterior to the heart. There was no evidence of   hemodynamic compromise.   Neuro/Psych negative neurological ROS  negative psych ROS   GI/Hepatic Neg liver ROS, GERD  ,  Endo/Other  diabetesS/'p thyroidectomy for graves disease, now resolved.  Renal/GU negative Renal ROS  negative  genitourinary   Musculoskeletal  (+) Arthritis ,   Abdominal   Peds negative pediatric ROS (+)  Hematology  (+) anemia ,   Anesthesia Other Findings   Reproductive/Obstetrics negative OB ROS                             Anesthesia Physical  Anesthesia Plan  ASA: IV  Anesthesia Plan: MAC   Post-op Pain Management:    Induction: Intravenous  Airway Management Planned: Simple Face Mask  Additional Equipment:   Intra-op Plan:   Post-operative Plan: Extubation in OR  Informed Consent: I have reviewed the patients History and Physical, chart, labs and discussed the procedure including the risks, benefits and alternatives for the proposed anesthesia with the patient or authorized representative who has indicated his/her understanding and acceptance.   Dental advisory given  Plan Discussed with: CRNA and Surgeon  Anesthesia Plan Comments:        Anesthesia Quick Evaluation

## 2016-04-30 NOTE — Anesthesia Postprocedure Evaluation (Signed)
Anesthesia Post Note  Patient: [name]  Procedure(s) Performed: Procedure(s) (LRB): Revision Right Below Knee Amputation with application of prevena (Right)  Patient location during evaluation: PACU Anesthesia Type: MAC Level of consciousness: awake and alert Pain management: pain level controlled Vital Signs Assessment: post-procedure vital signs reviewed and stable Respiratory status: spontaneous breathing, nonlabored ventilation, respiratory function stable and patient connected to nasal cannula oxygen Cardiovascular status: stable and blood pressure returned to baseline Anesthetic complications: no    Last Vitals:  Vitals:   04/30/16 1245 04/30/16 1255  BP: 94/73 94/73  Pulse: (!) 33 (!) 44  Resp: (!) 21 (!) 22  Temp:      Last Pain:  Vitals:   04/30/16 1300  PainSc: Asleep                 Airon Sahni S Nachman Sundt   HR more consistently running low 100's-110 in A-fib as per baseline upon presentation in pre-op

## 2016-05-01 ENCOUNTER — Encounter (HOSPITAL_COMMUNITY): Payer: Self-pay | Admitting: Orthopedic Surgery

## 2016-05-01 LAB — GLUCOSE, CAPILLARY
GLUCOSE-CAPILLARY: 134 mg/dL — AB (ref 65–99)
GLUCOSE-CAPILLARY: 121 mg/dL — AB (ref 65–99)
Glucose-Capillary: 167 mg/dL — ABNORMAL HIGH (ref 65–99)

## 2016-05-01 MED ORDER — OXYCODONE-ACETAMINOPHEN 5-325 MG PO TABS: 1.0000 | ORAL_TABLET | ORAL | 0 refills | Status: AC | PRN

## 2016-05-01 NOTE — Discharge Instructions (Signed)
Information on my medicine - XARELTO® (Rivaroxaban) ° °This medication education was reviewed with me or my healthcare representative as part of my discharge preparation.  The pharmacist that spoke with me during my hospital stay was:  Mylen Mangan T, RPH ° °Why was Xarelto® prescribed for you? °Xarelto® was prescribed for you to reduce the risk of a blood clot forming that can cause a stroke if you have a medical condition called atrial fibrillation (a type of irregular heartbeat). ° °What do you need to know about xarelto® ? °Take your Xarelto® ONCE DAILY at the same time every day with your evening meal. °If you have difficulty swallowing the tablet whole, you may crush it and mix in applesauce just prior to taking your dose. ° °Take Xarelto® exactly as prescribed by your doctor and DO NOT stop taking Xarelto® without talking to the doctor who prescribed the medication.  Stopping without other stroke prevention medication to take the place of Xarelto® may increase your risk of developing a clot that causes a stroke.  Refill your prescription before you run out. ° °After discharge, you should have regular check-up appointments with your healthcare provider that is prescribing your Xarelto®.  In the future your dose may need to be changed if your kidney function or weight changes by a significant amount. ° °What do you do if you miss a dose? °If you are taking Xarelto® ONCE DAILY and you miss a dose, take it as soon as you remember on the same day then continue your regularly scheduled once daily regimen the next day. Do not take two doses of Xarelto® at the same time or on the same day.  ° °Important Safety Information °A possible side effect of Xarelto® is bleeding. You should call your healthcare provider right away if you experience any of the following: °? Bleeding from an injury or your nose that does not stop. °? Unusual colored urine (red or dark brown) or unusual colored stools (red or black). °? Unusual  bruising for unknown reasons. °? A serious fall or if you hit your head (even if there is no bleeding). ° °Some medicines may interact with Xarelto® and might increase your risk of bleeding while on Xarelto®. To help avoid this, consult your healthcare provider or pharmacist prior to using any new prescription or non-prescription medications, including herbals, vitamins, non-steroidal anti-inflammatory drugs (NSAIDs) and supplements. ° °This website has more information on Xarelto®: www.xarelto.com. °Information on my medicine - XARELTO® (Rivaroxaban) ° °This medication education was reviewed with me or my healthcare representative as part of my discharge preparation.  The pharmacist that spoke with me during my hospital stay was:  Karlita Lichtman T, RPH ° °Why was Xarelto® prescribed for you? °Xarelto® was prescribed for you to reduce the risk of a blood clot forming that can cause a stroke if you have a medical condition called atrial fibrillation (a type of irregular heartbeat). ° °What do you need to know about xarelto® ? °Take your Xarelto® ONCE DAILY at the same time every day with your evening meal. °If you have difficulty swallowing the tablet whole, you may crush it and mix in applesauce just prior to taking your dose. ° °Take Xarelto® exactly as prescribed by your doctor and DO NOT stop taking Xarelto® without talking to the doctor who prescribed the medication.  Stopping without other stroke prevention medication to take the place of Xarelto® may increase your risk of developing a clot that causes a stroke.  Refill your prescription   before you run out. ° °After discharge, you should have regular check-up appointments with your healthcare provider that is prescribing your Xarelto®.  In the future your dose may need to be changed if your kidney function or weight changes by a significant amount. ° °What do you do if you miss a dose? °If you are taking Xarelto® ONCE DAILY and you miss a dose, take it as soon  as you remember on the same day then continue your regularly scheduled once daily regimen the next day. Do not take two doses of Xarelto® at the same time or on the same day.  ° °Important Safety Information °A possible side effect of Xarelto® is bleeding. You should call your healthcare provider right away if you experience any of the following: °? Bleeding from an injury or your nose that does not stop. °? Unusual colored urine (red or dark brown) or unusual colored stools (red or black). °? Unusual bruising for unknown reasons. °? A serious fall or if you hit your head (even if there is no bleeding). ° °Some medicines may interact with Xarelto® and might increase your risk of bleeding while on Xarelto®. To help avoid this, consult your healthcare provider or pharmacist prior to using any new prescription or non-prescription medications, including herbals, vitamins, non-steroidal anti-inflammatory drugs (NSAIDs) and supplements. ° °This website has more information on Xarelto®: www.xarelto.com. ° °

## 2016-05-01 NOTE — Progress Notes (Signed)
Pt stable for d/c today per MD. Dr. Lajoyce Corners aware that pt will go home with family despite PT recommendations for SNF. Hospital wound vac switched to [facility] prior to d/c. Discharge instructions and prescriptions reviewed with pt and her daughter and granddaughter, all questions answered. Educated on prevena wound vac. Assisted to car via wheelchair by NT& RN.   [name]

## 2016-05-01 NOTE — Evaluation (Signed)
Physical Therapy Evaluation Patient Details Name: [name] MRN: [id number] DOB: September 05, 1925 Today's Date: 05/01/2016   History of Present Illness  Pt is an 82 y/o female s/p R transtibial amputation revision. PMH including but not limited to PVD, A-fib, CHF, HTN, DM, Grave's disease and original R transtibial amputation on 03/21/2016.  Clinical Impression  Pt presented supine in bed with HOB elevated, initially asleep and lethargic throughout session. Pt willing to participate. Pt was a poor historian throughout and it was difficult to assess PLOF and cognition secondary to lethargy and no caregiver present. Pt required max A x2 for bed mobility and total A x2 for transfers. At this time PT recommending pt d/c to SNF for post-acute rehab services. Pt would continue to benefit from skilled physical therapy services at this time while admitted and after d/c to address her below listed limitations in order to improve her overall safety and independence with functional mobility.     Follow Up Recommendations SNF (if pt refuses, PT recommending HH PT and 24 hr supervision)    Equipment Recommendations  None recommended by PT    Recommendations for Other Services       Precautions / Restrictions Precautions Precautions: Fall Restrictions Weight Bearing Restrictions: Yes RLE Weight Bearing: Non weight bearing      Mobility  Bed Mobility Overal bed mobility: Needs Assistance;+2 for physical assistance Bed Mobility: Supine to Sit     Supine to sit: Max assist;+2 for physical assistance;HOB elevated     General bed mobility comments: pt required max A at bilateral LEs and upper body, as well as use of pad underneath to assist   Transfers Overall transfer level: Needs assistance Equipment used: 2 person hand held assist Transfers: Sit to/from [employer] Sit to Stand: Total assist;+2 physical assistance Stand pivot transfers: Total assist;+2 physical assistance        General transfer comment: pt very fearful during transfer and leaning posteriorly. pt required total A for both transfers and PT blocking pt's L knee  Ambulation/Gait                Stairs            Wheelchair Mobility    Modified Rankin (Stroke Patients Only)       Balance Overall balance assessment: Needs assistance Sitting-balance support: Feet supported;Bilateral upper extremity supported Sitting balance-Leahy Scale: Poor Sitting balance - Comments: pt very fearful when sitting EOB and expressed concerns of falling Postural control: Posterior lean Standing balance support: During functional activity Standing balance-Leahy Scale: Zero Standing balance comment: pt required total A x2 in standing and PT blocking pt's L knee                             Pertinent Vitals/Pain Pain Assessment: Faces Faces Pain Scale: Hurts even more Pain Location: R LE Pain Descriptors / Indicators: Grimacing;Guarding;Moaning Pain Intervention(s): Monitored during session;Repositioned    Home Living Family/patient expects to be discharged to:: Unsure Living Arrangements: Children;Other relatives               Additional Comments: Pt not a reliable historian and unable to give details of where she lives during evaluation.    Prior Function Level of Independence: Needs assistance   Gait / Transfers Assistance Needed: pt reported that she ambulated independently prior to admission  ADL's / Homemaking Assistance Needed: pt stated that she needed assistance with bathing and dressing  Hand Dominance        Extremity/Trunk Assessment   Upper Extremity Assessment: Generalized weakness           Lower Extremity Assessment: RLE deficits/detail;Generalized weakness RLE Deficits / Details: Pt with transitibial amputation.       Communication   Communication: No difficulties  Cognition Arousal/Alertness: Lethargic Behavior During Therapy:  Anxious Overall Cognitive Status: No family/caregiver present to determine baseline cognitive functioning       Memory: Decreased short-term memory              General Comments      Exercises        Assessment/Plan    PT Assessment Patient needs continued PT services  PT Diagnosis Difficulty walking;Acute pain   PT Problem List Decreased strength;Decreased range of motion;Decreased activity tolerance;Decreased balance;Decreased mobility;Decreased coordination;Decreased knowledge of use of DME;Pain  PT Treatment Interventions DME instruction;Gait training;Functional mobility training;Therapeutic activities;Therapeutic exercise;Balance training;Neuromuscular re-education;Patient/family education   PT Goals (Current goals can be found in the Care Plan section) Acute Rehab PT Goals Patient Stated Goal: none stated PT Goal Formulation: With patient Time For Goal Achievement: 05/15/16 Potential to Achieve Goals: Fair    Frequency Min 5X/week   Barriers to discharge        Co-evaluation               End of Session Equipment Utilized During Treatment: Gait belt Activity Tolerance: Patient limited by pain Patient left: in chair;with call bell/phone within reach;with chair alarm set Nurse Communication: Mobility status    Functional Assessment Tool Used: clinical judgement Functional Limitation: Mobility: Walking and moving around Mobility: Walking and Moving Around Current Status [phone](G8978): 100 percent impaired, limited or restricted Mobility: Walking and Moving Around Goal Status [phone](G8979): At least 80 percent but less than 100 percent impaired, limited or restricted    Time: [id number] PT Time Calculation (min) (ACUTE ONLY): 21 min   Charges:   PT Evaluation $PT Eval Moderate Complexity: 1 Procedure     PT G Codes:   PT G-Codes **NOT FOR INPATIENT CLASS** Functional Assessment Tool Used: clinical judgement Functional Limitation: Mobility: Walking and moving  around Mobility: Walking and Moving Around Current Status ([id number](G8978): 100 percent impaired, limited or restricted Mobility: Walking and Moving Around Goal Status (A2130(G8979): At least 80 percent but less than 100 percent impaired, limited or restricted    [facility] Hlth CtrJennifer M Echo [place] 05/01/2016, 1:26 PM Deborah ChalkJennifer Letesha Klecker, PT, DPT [phone]

## 2016-05-01 NOTE — Discharge Summary (Signed)
Physician Discharge Summary  Patient ID: [name] MRN: [id number] DOB/AGE: 80-Jun-1927 80 y.o.  Admit date: 04/30/2016 Discharge date: 05/01/2016  Admission Diagnoses:Dehiscence transtibial amputation  Discharge Diagnoses: Revision transtibial amputation Active Problems:   Below knee amputation status [facility])   Discharged Condition: stable  Hospital Course: Patient's hospital course and showed remarkable. Gentleman revision of the transtibial amputation. Postoperatively she was stable and family wish for discharge to home.  Consults: None  Significant Diagnostic Studies: labs: Routine labs  Treatments: surgery: See operative note  Discharge Exam: Blood pressure 128/88, pulse (!) 101, temperature 98.6 F (37 C), temperature source Oral, resp. rate 17, height 5\' 7"  (1.702 m), weight 63.5 kg (140 lb), SpO2 100 %. Incision/Wound: Wound VAC clean and dry  Disposition: 01-Home or Self Care  Discharge Instructions    Call MD / Call 911    Complete by:  As directed    If you experience chest pain or shortness of breath, CALL 911 and be transported to the hospital emergency room.  If you develope a fever above 101 F, pus (white drainage) or increased drainage or redness at the wound, or calf pain, call your surgeon's office.   Constipation Prevention    Complete by:  As directed    Drink plenty of fluids.  Prune juice may be helpful.  You may use a stool softener, such as Colace (over the counter) 100 mg twice a day.  Use MiraLax (over the counter) for constipation as needed.   Diet - low sodium heart healthy    Complete by:  As directed    Increase activity slowly as tolerated    Complete by:  As directed        Medication List    TAKE these medications   acetaminophen 500 MG tablet Commonly known as:  TYLENOL Take 1 tablet (500 mg total) by mouth every 6 (six) hours as needed for mild pain (or Fever >/= 101). Please do not take more than 6 tablets today.   albuterol 108  (90 Base) MCG/ACT inhaler Commonly known as:  PROVENTIL HFA;VENTOLIN HFA Inhale 2 puffs into the lungs every 6 (six) hours as needed for wheezing or shortness of breath.   BD ULTRA-FINE LANCETS lancets Use to check blood sugar one time daily   brimonidine 0.1 % Soln Commonly known as:  ALPHAGAN P Place 1 drop into the left eye every 12 (twelve) hours.   cetirizine 5 MG tablet Commonly known as:  ZYRTEC Take 1 tablet (5 mg total) by mouth daily.   dorzolamide 2 % ophthalmic solution Commonly known as:  TRUSOPT Place 1 drop into the left eye 2 (two) times daily.   DULERA 100-5 MCG/ACT Aero Generic drug:  mometasone-formoterol INHALE 2 PUFFS INTO THE LUNGS TWICE DAILY   ferrous sulfate 325 (65 FE) MG tablet TAKE 1 TABLET(325 MG) BY MOUTH DAILY   glucose blood test strip Use as instructed   lisinopril 10 MG tablet Commonly known as:  PRINIVIL,ZESTRIL Take 1 tablet (10 mg total) by mouth daily.   metFORMIN 1000 MG tablet Commonly known as:  GLUCOPHAGE TAKE 1 TABLET(1000 MG) BY MOUTH TWICE DAILY WITH A MEAL   metoprolol succinate 100 MG 24 hr tablet Commonly known as:  TOPROL-XL Take 1 tablet daily What changed:  how much to take  how to take this  when to take this  additional instructions   ondansetron 4 MG tablet Commonly known as:  ZOFRAN Take 1 tablet (4 mg total) by mouth every 8 (  eight) hours as needed for nausea or vomiting.   oxyCODONE-acetaminophen 5-325 MG tablet Commonly known as:  ROXICET Take 1 tablet by mouth every 4 (four) hours as needed for severe pain.   polyethylene glycol packet Commonly known as:  MIRALAX / GLYCOLAX Take 17 g by mouth daily.   ranitidine 75 MG tablet Commonly known as:  ZANTAC Take 75 mg by mouth daily as needed for heartburn (or nausea).   Rivaroxaban 15 MG Tabs tablet Commonly known as:  XARELTO Take 1 tablet (15 mg total) by mouth daily with supper. What changed:  when to take this   spironolactone 25 MG  tablet Commonly known as:  ALDACTONE TAKE 1 TABLET BY MOUTH EVERY DAY   traMADol 50 MG tablet Commonly known as:  ULTRAM Take 1 tablet (50 mg total) by mouth every 6 (six) hours as needed. What changed:  reasons to take this      Follow-up Information    Nadara MustardMarcus V Duda, MD Follow up in 1 week(s).   Specialty:  Orthopedic Surgery Contact information: [address] [place] [id number] [phone]           Signed: Nadara MustardMarcus V Duda 05/01/2016, 7:49 AM

## 2016-05-01 NOTE — Progress Notes (Signed)
Physical therapist notified RN that they were recommending SNF for pt. RN contacted daughter, [name], who she lives with of this recommendation, however she stated, "no, she will be coming home with me." Pt's granddaughter apparently takes care of her during the day while [name] is at work. Pt is agreeable and wants to go home as well. Dr. Lajoyce Corners made aware of update, and he is agreeable as well.  Balm, Latricia Heft

## 2016-05-01 NOTE — Care Management Note (Addendum)
Case Management Note  Patient Details  Name: [name] MRN: [id number] Date of Birth: 07-Apr-1926  Subjective/Objective:   Revision transtibial amputation                 Action/Plan: Discharge Planning: AVS reviewed:   NCM spoke to dtr, [name] # [phone] via phone. Dtr at work. States she lives in home with pt. Pt has [facility] in the past. States HH scheduled to come within 24-48 post dc. [facility]. Contacted [facility] with new referral. Verified with agency soc date 24-48 hours. Pt has hospital bed and bedside commode at home. Dtr states they are working on getting her a scooter. Explained to follow with PCP/surgeon on helping with obtaining scooter.    Expected Discharge Date:  05/01/2016               Expected Discharge Plan:  Home w Home Health Services  In-House Referral:  NA  Discharge planning Services  CM Consult  Post Acute Care Choice:  Home Health Choice offered to:  Adult Children  DME Arranged:  N/A DME Agency:  NA  HH Arranged:  RN, PT HH Agency:  [facility]  Status of Service:  Completed, signed off  If discussed at Long Length of Stay Meetings, dates discussed:    Additional Comments:  Elliot Cousin, RN 05/01/2016, 3:18 PM

## 2016-05-03 DIAGNOSIS — I4891 Unspecified atrial fibrillation: Secondary | ICD-10-CM | POA: Diagnosis not present

## 2016-05-03 DIAGNOSIS — Z7901 Long term (current) use of anticoagulants: Secondary | ICD-10-CM | POA: Diagnosis not present

## 2016-05-03 DIAGNOSIS — Z7984 Long term (current) use of oral hypoglycemic drugs: Secondary | ICD-10-CM | POA: Diagnosis not present

## 2016-05-03 DIAGNOSIS — Z89511 Acquired absence of right leg below knee: Secondary | ICD-10-CM | POA: Diagnosis not present

## 2016-05-03 DIAGNOSIS — Z9981 Dependence on supplemental oxygen: Secondary | ICD-10-CM | POA: Diagnosis not present

## 2016-05-03 DIAGNOSIS — Z7951 Long term (current) use of inhaled steroids: Secondary | ICD-10-CM | POA: Diagnosis not present

## 2016-05-03 DIAGNOSIS — I5042 Chronic combined systolic (congestive) and diastolic (congestive) heart failure: Secondary | ICD-10-CM | POA: Diagnosis not present

## 2016-05-03 DIAGNOSIS — E1151 Type 2 diabetes mellitus with diabetic peripheral angiopathy without gangrene: Secondary | ICD-10-CM | POA: Diagnosis not present

## 2016-05-03 DIAGNOSIS — Z4781 Encounter for orthopedic aftercare following surgical amputation: Secondary | ICD-10-CM | POA: Diagnosis not present

## 2016-05-03 DIAGNOSIS — I110 Hypertensive heart disease with heart failure: Secondary | ICD-10-CM | POA: Diagnosis not present

## 2016-05-04 ENCOUNTER — Other Ambulatory Visit: Payer: Self-pay | Admitting: Internal Medicine

## 2016-05-05 ENCOUNTER — Telehealth: Payer: Self-pay | Admitting: Internal Medicine

## 2016-05-05 DIAGNOSIS — E1151 Type 2 diabetes mellitus with diabetic peripheral angiopathy without gangrene: Secondary | ICD-10-CM | POA: Diagnosis not present

## 2016-05-05 DIAGNOSIS — I4891 Unspecified atrial fibrillation: Secondary | ICD-10-CM | POA: Diagnosis not present

## 2016-05-05 DIAGNOSIS — Z7984 Long term (current) use of oral hypoglycemic drugs: Secondary | ICD-10-CM | POA: Diagnosis not present

## 2016-05-05 DIAGNOSIS — I110 Hypertensive heart disease with heart failure: Secondary | ICD-10-CM | POA: Diagnosis not present

## 2016-05-05 DIAGNOSIS — Z9981 Dependence on supplemental oxygen: Secondary | ICD-10-CM | POA: Diagnosis not present

## 2016-05-05 DIAGNOSIS — Z7951 Long term (current) use of inhaled steroids: Secondary | ICD-10-CM | POA: Diagnosis not present

## 2016-05-05 DIAGNOSIS — I5042 Chronic combined systolic (congestive) and diastolic (congestive) heart failure: Secondary | ICD-10-CM | POA: Diagnosis not present

## 2016-05-05 DIAGNOSIS — Z4781 Encounter for orthopedic aftercare following surgical amputation: Secondary | ICD-10-CM | POA: Diagnosis not present

## 2016-05-05 DIAGNOSIS — Z89511 Acquired absence of right leg below knee: Secondary | ICD-10-CM | POA: Diagnosis not present

## 2016-05-05 DIAGNOSIS — Z7901 Long term (current) use of anticoagulants: Secondary | ICD-10-CM | POA: Diagnosis not present

## 2016-05-05 NOTE — Telephone Encounter (Signed)
Florene Route from [facility] - requesting verbal orders for "Skilled nursing visits 2 times a week and Home health aide twice a week:"  Verbal order given if  Not appropriate, let me know. States has wound vac. And will do any wound care per Dr Lajoyce Corners.  Thanks

## 2016-05-05 NOTE — Telephone Encounter (Signed)
Needs Orders for skilled nursing visits and home health aide

## 2016-05-05 NOTE — Telephone Encounter (Signed)
It appears she refilled Lisinopril 20mg  tablets yesterday, I would recommend she break those in half and take 10mg  daily. I refused the pharmacy refill.

## 2016-05-06 DIAGNOSIS — I110 Hypertensive heart disease with heart failure: Secondary | ICD-10-CM | POA: Diagnosis not present

## 2016-05-06 DIAGNOSIS — Z4781 Encounter for orthopedic aftercare following surgical amputation: Secondary | ICD-10-CM | POA: Diagnosis not present

## 2016-05-06 DIAGNOSIS — I4891 Unspecified atrial fibrillation: Secondary | ICD-10-CM | POA: Diagnosis not present

## 2016-05-06 DIAGNOSIS — Z89511 Acquired absence of right leg below knee: Secondary | ICD-10-CM | POA: Diagnosis not present

## 2016-05-06 DIAGNOSIS — E1151 Type 2 diabetes mellitus with diabetic peripheral angiopathy without gangrene: Secondary | ICD-10-CM | POA: Diagnosis not present

## 2016-05-06 DIAGNOSIS — Z9981 Dependence on supplemental oxygen: Secondary | ICD-10-CM | POA: Diagnosis not present

## 2016-05-06 DIAGNOSIS — Z7901 Long term (current) use of anticoagulants: Secondary | ICD-10-CM | POA: Diagnosis not present

## 2016-05-06 DIAGNOSIS — I5042 Chronic combined systolic (congestive) and diastolic (congestive) heart failure: Secondary | ICD-10-CM | POA: Diagnosis not present

## 2016-05-06 DIAGNOSIS — Z7984 Long term (current) use of oral hypoglycemic drugs: Secondary | ICD-10-CM | POA: Diagnosis not present

## 2016-05-06 DIAGNOSIS — Z7951 Long term (current) use of inhaled steroids: Secondary | ICD-10-CM | POA: Diagnosis not present

## 2016-05-06 NOTE — Telephone Encounter (Signed)
Agree with verbal order 

## 2016-05-07 DIAGNOSIS — I5042 Chronic combined systolic (congestive) and diastolic (congestive) heart failure: Secondary | ICD-10-CM | POA: Diagnosis not present

## 2016-05-07 DIAGNOSIS — I110 Hypertensive heart disease with heart failure: Secondary | ICD-10-CM | POA: Diagnosis not present

## 2016-05-07 DIAGNOSIS — Z7901 Long term (current) use of anticoagulants: Secondary | ICD-10-CM | POA: Diagnosis not present

## 2016-05-07 DIAGNOSIS — Z7951 Long term (current) use of inhaled steroids: Secondary | ICD-10-CM | POA: Diagnosis not present

## 2016-05-07 DIAGNOSIS — I4891 Unspecified atrial fibrillation: Secondary | ICD-10-CM | POA: Diagnosis not present

## 2016-05-07 DIAGNOSIS — Z4781 Encounter for orthopedic aftercare following surgical amputation: Secondary | ICD-10-CM | POA: Diagnosis not present

## 2016-05-07 DIAGNOSIS — Z89511 Acquired absence of right leg below knee: Secondary | ICD-10-CM | POA: Diagnosis not present

## 2016-05-07 DIAGNOSIS — Z9981 Dependence on supplemental oxygen: Secondary | ICD-10-CM | POA: Diagnosis not present

## 2016-05-07 DIAGNOSIS — E1151 Type 2 diabetes mellitus with diabetic peripheral angiopathy without gangrene: Secondary | ICD-10-CM | POA: Diagnosis not present

## 2016-05-07 DIAGNOSIS — Z7984 Long term (current) use of oral hypoglycemic drugs: Secondary | ICD-10-CM | POA: Diagnosis not present

## 2016-05-09 DIAGNOSIS — I4891 Unspecified atrial fibrillation: Secondary | ICD-10-CM | POA: Diagnosis not present

## 2016-05-09 DIAGNOSIS — Z4781 Encounter for orthopedic aftercare following surgical amputation: Secondary | ICD-10-CM | POA: Diagnosis not present

## 2016-05-09 DIAGNOSIS — Z7984 Long term (current) use of oral hypoglycemic drugs: Secondary | ICD-10-CM | POA: Diagnosis not present

## 2016-05-09 DIAGNOSIS — Z7951 Long term (current) use of inhaled steroids: Secondary | ICD-10-CM | POA: Diagnosis not present

## 2016-05-09 DIAGNOSIS — E1151 Type 2 diabetes mellitus with diabetic peripheral angiopathy without gangrene: Secondary | ICD-10-CM | POA: Diagnosis not present

## 2016-05-09 DIAGNOSIS — I110 Hypertensive heart disease with heart failure: Secondary | ICD-10-CM | POA: Diagnosis not present

## 2016-05-09 DIAGNOSIS — Z89511 Acquired absence of right leg below knee: Secondary | ICD-10-CM | POA: Diagnosis not present

## 2016-05-09 DIAGNOSIS — Z7901 Long term (current) use of anticoagulants: Secondary | ICD-10-CM | POA: Diagnosis not present

## 2016-05-09 DIAGNOSIS — Z9981 Dependence on supplemental oxygen: Secondary | ICD-10-CM | POA: Diagnosis not present

## 2016-05-09 DIAGNOSIS — I5042 Chronic combined systolic (congestive) and diastolic (congestive) heart failure: Secondary | ICD-10-CM | POA: Diagnosis not present

## 2016-05-12 DIAGNOSIS — Z89511 Acquired absence of right leg below knee: Secondary | ICD-10-CM | POA: Diagnosis not present

## 2016-05-12 DIAGNOSIS — Z7951 Long term (current) use of inhaled steroids: Secondary | ICD-10-CM | POA: Diagnosis not present

## 2016-05-12 DIAGNOSIS — Z9981 Dependence on supplemental oxygen: Secondary | ICD-10-CM | POA: Diagnosis not present

## 2016-05-12 DIAGNOSIS — E1151 Type 2 diabetes mellitus with diabetic peripheral angiopathy without gangrene: Secondary | ICD-10-CM | POA: Diagnosis not present

## 2016-05-12 DIAGNOSIS — Z7901 Long term (current) use of anticoagulants: Secondary | ICD-10-CM | POA: Diagnosis not present

## 2016-05-12 DIAGNOSIS — Z4781 Encounter for orthopedic aftercare following surgical amputation: Secondary | ICD-10-CM | POA: Diagnosis not present

## 2016-05-12 DIAGNOSIS — I110 Hypertensive heart disease with heart failure: Secondary | ICD-10-CM | POA: Diagnosis not present

## 2016-05-12 DIAGNOSIS — I4891 Unspecified atrial fibrillation: Secondary | ICD-10-CM | POA: Diagnosis not present

## 2016-05-12 DIAGNOSIS — I5042 Chronic combined systolic (congestive) and diastolic (congestive) heart failure: Secondary | ICD-10-CM | POA: Diagnosis not present

## 2016-05-12 DIAGNOSIS — Z7984 Long term (current) use of oral hypoglycemic drugs: Secondary | ICD-10-CM | POA: Diagnosis not present

## 2016-05-13 DIAGNOSIS — Z7951 Long term (current) use of inhaled steroids: Secondary | ICD-10-CM | POA: Diagnosis not present

## 2016-05-13 DIAGNOSIS — Z4781 Encounter for orthopedic aftercare following surgical amputation: Secondary | ICD-10-CM | POA: Diagnosis not present

## 2016-05-13 DIAGNOSIS — I4891 Unspecified atrial fibrillation: Secondary | ICD-10-CM | POA: Diagnosis not present

## 2016-05-13 DIAGNOSIS — Z7901 Long term (current) use of anticoagulants: Secondary | ICD-10-CM | POA: Diagnosis not present

## 2016-05-13 DIAGNOSIS — Z89511 Acquired absence of right leg below knee: Secondary | ICD-10-CM | POA: Diagnosis not present

## 2016-05-13 DIAGNOSIS — Z9981 Dependence on supplemental oxygen: Secondary | ICD-10-CM | POA: Diagnosis not present

## 2016-05-13 DIAGNOSIS — I110 Hypertensive heart disease with heart failure: Secondary | ICD-10-CM | POA: Diagnosis not present

## 2016-05-13 DIAGNOSIS — I5042 Chronic combined systolic (congestive) and diastolic (congestive) heart failure: Secondary | ICD-10-CM | POA: Diagnosis not present

## 2016-05-13 DIAGNOSIS — Z7984 Long term (current) use of oral hypoglycemic drugs: Secondary | ICD-10-CM | POA: Diagnosis not present

## 2016-05-13 DIAGNOSIS — E1151 Type 2 diabetes mellitus with diabetic peripheral angiopathy without gangrene: Secondary | ICD-10-CM | POA: Diagnosis not present

## 2016-05-14 DIAGNOSIS — Z9981 Dependence on supplemental oxygen: Secondary | ICD-10-CM | POA: Diagnosis not present

## 2016-05-14 DIAGNOSIS — I5042 Chronic combined systolic (congestive) and diastolic (congestive) heart failure: Secondary | ICD-10-CM | POA: Diagnosis not present

## 2016-05-14 DIAGNOSIS — Z89511 Acquired absence of right leg below knee: Secondary | ICD-10-CM | POA: Diagnosis not present

## 2016-05-14 DIAGNOSIS — Z7951 Long term (current) use of inhaled steroids: Secondary | ICD-10-CM | POA: Diagnosis not present

## 2016-05-14 DIAGNOSIS — Z7984 Long term (current) use of oral hypoglycemic drugs: Secondary | ICD-10-CM | POA: Diagnosis not present

## 2016-05-14 DIAGNOSIS — I4891 Unspecified atrial fibrillation: Secondary | ICD-10-CM | POA: Diagnosis not present

## 2016-05-14 DIAGNOSIS — Z7901 Long term (current) use of anticoagulants: Secondary | ICD-10-CM | POA: Diagnosis not present

## 2016-05-14 DIAGNOSIS — E1151 Type 2 diabetes mellitus with diabetic peripheral angiopathy without gangrene: Secondary | ICD-10-CM | POA: Diagnosis not present

## 2016-05-14 DIAGNOSIS — Z4781 Encounter for orthopedic aftercare following surgical amputation: Secondary | ICD-10-CM | POA: Diagnosis not present

## 2016-05-14 DIAGNOSIS — I110 Hypertensive heart disease with heart failure: Secondary | ICD-10-CM | POA: Diagnosis not present

## 2016-05-15 DIAGNOSIS — Z7951 Long term (current) use of inhaled steroids: Secondary | ICD-10-CM | POA: Diagnosis not present

## 2016-05-15 DIAGNOSIS — I5042 Chronic combined systolic (congestive) and diastolic (congestive) heart failure: Secondary | ICD-10-CM | POA: Diagnosis not present

## 2016-05-15 DIAGNOSIS — E1151 Type 2 diabetes mellitus with diabetic peripheral angiopathy without gangrene: Secondary | ICD-10-CM | POA: Diagnosis not present

## 2016-05-15 DIAGNOSIS — Z9981 Dependence on supplemental oxygen: Secondary | ICD-10-CM | POA: Diagnosis not present

## 2016-05-15 DIAGNOSIS — Z7901 Long term (current) use of anticoagulants: Secondary | ICD-10-CM | POA: Diagnosis not present

## 2016-05-15 DIAGNOSIS — I4891 Unspecified atrial fibrillation: Secondary | ICD-10-CM | POA: Diagnosis not present

## 2016-05-15 DIAGNOSIS — Z89511 Acquired absence of right leg below knee: Secondary | ICD-10-CM | POA: Diagnosis not present

## 2016-05-15 DIAGNOSIS — Z7984 Long term (current) use of oral hypoglycemic drugs: Secondary | ICD-10-CM | POA: Diagnosis not present

## 2016-05-15 DIAGNOSIS — I110 Hypertensive heart disease with heart failure: Secondary | ICD-10-CM | POA: Diagnosis not present

## 2016-05-15 DIAGNOSIS — Z4781 Encounter for orthopedic aftercare following surgical amputation: Secondary | ICD-10-CM | POA: Diagnosis not present

## 2016-05-16 DIAGNOSIS — I4891 Unspecified atrial fibrillation: Secondary | ICD-10-CM | POA: Diagnosis not present

## 2016-05-16 DIAGNOSIS — Z9981 Dependence on supplemental oxygen: Secondary | ICD-10-CM | POA: Diagnosis not present

## 2016-05-16 DIAGNOSIS — I5042 Chronic combined systolic (congestive) and diastolic (congestive) heart failure: Secondary | ICD-10-CM | POA: Diagnosis not present

## 2016-05-16 DIAGNOSIS — Z7984 Long term (current) use of oral hypoglycemic drugs: Secondary | ICD-10-CM | POA: Diagnosis not present

## 2016-05-16 DIAGNOSIS — Z7901 Long term (current) use of anticoagulants: Secondary | ICD-10-CM | POA: Diagnosis not present

## 2016-05-16 DIAGNOSIS — I110 Hypertensive heart disease with heart failure: Secondary | ICD-10-CM | POA: Diagnosis not present

## 2016-05-16 DIAGNOSIS — Z4781 Encounter for orthopedic aftercare following surgical amputation: Secondary | ICD-10-CM | POA: Diagnosis not present

## 2016-05-16 DIAGNOSIS — Z89511 Acquired absence of right leg below knee: Secondary | ICD-10-CM | POA: Diagnosis not present

## 2016-05-16 DIAGNOSIS — Z7951 Long term (current) use of inhaled steroids: Secondary | ICD-10-CM | POA: Diagnosis not present

## 2016-05-16 DIAGNOSIS — E1151 Type 2 diabetes mellitus with diabetic peripheral angiopathy without gangrene: Secondary | ICD-10-CM | POA: Diagnosis not present

## 2016-05-20 ENCOUNTER — Encounter (HOSPITAL_COMMUNITY): Payer: Self-pay | Admitting: Orthopedic Surgery

## 2016-05-20 DIAGNOSIS — Z7901 Long term (current) use of anticoagulants: Secondary | ICD-10-CM | POA: Diagnosis not present

## 2016-05-20 DIAGNOSIS — Z9981 Dependence on supplemental oxygen: Secondary | ICD-10-CM | POA: Diagnosis not present

## 2016-05-20 DIAGNOSIS — Z7984 Long term (current) use of oral hypoglycemic drugs: Secondary | ICD-10-CM | POA: Diagnosis not present

## 2016-05-20 DIAGNOSIS — Z7951 Long term (current) use of inhaled steroids: Secondary | ICD-10-CM | POA: Diagnosis not present

## 2016-05-20 DIAGNOSIS — Z4781 Encounter for orthopedic aftercare following surgical amputation: Secondary | ICD-10-CM | POA: Diagnosis not present

## 2016-05-20 DIAGNOSIS — I4891 Unspecified atrial fibrillation: Secondary | ICD-10-CM | POA: Diagnosis not present

## 2016-05-20 DIAGNOSIS — I110 Hypertensive heart disease with heart failure: Secondary | ICD-10-CM | POA: Diagnosis not present

## 2016-05-20 DIAGNOSIS — I5042 Chronic combined systolic (congestive) and diastolic (congestive) heart failure: Secondary | ICD-10-CM | POA: Diagnosis not present

## 2016-05-20 DIAGNOSIS — E1151 Type 2 diabetes mellitus with diabetic peripheral angiopathy without gangrene: Secondary | ICD-10-CM | POA: Diagnosis not present

## 2016-05-20 DIAGNOSIS — Z89511 Acquired absence of right leg below knee: Secondary | ICD-10-CM | POA: Diagnosis not present

## 2016-05-20 NOTE — Addendum Note (Signed)
Addendum  created 05/20/16 1344 by Adair Laundry, CRNA   Anesthesia Event edited, Anesthesia Staff edited

## 2016-05-21 ENCOUNTER — Ambulatory Visit (INDEPENDENT_AMBULATORY_CARE_PROVIDER_SITE_OTHER): Payer: Medicare Other | Admitting: Orthopedic Surgery

## 2016-05-21 DIAGNOSIS — L02415 Cutaneous abscess of right lower limb: Secondary | ICD-10-CM

## 2016-05-21 DIAGNOSIS — Z89511 Acquired absence of right leg below knee: Secondary | ICD-10-CM

## 2016-05-22 ENCOUNTER — Other Ambulatory Visit: Payer: Self-pay | Admitting: Orthopedic Surgery

## 2016-05-22 ENCOUNTER — Ambulatory Visit (INDEPENDENT_AMBULATORY_CARE_PROVIDER_SITE_OTHER): Payer: Self-pay | Admitting: Orthopedic Surgery

## 2016-05-22 ENCOUNTER — Encounter (HOSPITAL_COMMUNITY): Payer: Self-pay | Admitting: *Deleted

## 2016-05-22 DIAGNOSIS — I5042 Chronic combined systolic (congestive) and diastolic (congestive) heart failure: Secondary | ICD-10-CM | POA: Diagnosis not present

## 2016-05-22 DIAGNOSIS — Z9981 Dependence on supplemental oxygen: Secondary | ICD-10-CM | POA: Diagnosis not present

## 2016-05-22 DIAGNOSIS — Z7901 Long term (current) use of anticoagulants: Secondary | ICD-10-CM | POA: Diagnosis not present

## 2016-05-22 DIAGNOSIS — E1151 Type 2 diabetes mellitus with diabetic peripheral angiopathy without gangrene: Secondary | ICD-10-CM | POA: Diagnosis not present

## 2016-05-22 DIAGNOSIS — Z4781 Encounter for orthopedic aftercare following surgical amputation: Secondary | ICD-10-CM | POA: Diagnosis not present

## 2016-05-22 DIAGNOSIS — Z7951 Long term (current) use of inhaled steroids: Secondary | ICD-10-CM | POA: Diagnosis not present

## 2016-05-22 DIAGNOSIS — I4891 Unspecified atrial fibrillation: Secondary | ICD-10-CM | POA: Diagnosis not present

## 2016-05-22 DIAGNOSIS — I110 Hypertensive heart disease with heart failure: Secondary | ICD-10-CM | POA: Diagnosis not present

## 2016-05-22 DIAGNOSIS — Z89511 Acquired absence of right leg below knee: Secondary | ICD-10-CM | POA: Diagnosis not present

## 2016-05-22 DIAGNOSIS — Z7984 Long term (current) use of oral hypoglycemic drugs: Secondary | ICD-10-CM | POA: Diagnosis not present

## 2016-05-22 NOTE — Progress Notes (Signed)
Pt SDW-pre-op call completed by pt daughter, Lytle Michaels. Daughter stated that pt was instructed by " lady at the office to only take metoprolol and eye drops the morning of surgery," when attempting to give instructions for pt to take [facility] inhaler and etc.

## 2016-05-23 ENCOUNTER — Encounter (HOSPITAL_COMMUNITY): Payer: Self-pay | Admitting: Certified Registered"

## 2016-05-23 ENCOUNTER — Inpatient Hospital Stay (HOSPITAL_COMMUNITY): Payer: Medicare Other | Admitting: Certified Registered"

## 2016-05-23 ENCOUNTER — Inpatient Hospital Stay (HOSPITAL_COMMUNITY)
Admission: RE | Admit: 2016-05-23 | Discharge: 2016-05-25 | DRG: 475 | Disposition: A | Discharge status: Home Health | Payer: Medicare Other | Source: Ambulatory Visit | Attending: Orthopedic Surgery | Admitting: Orthopedic Surgery

## 2016-05-23 ENCOUNTER — Encounter (HOSPITAL_COMMUNITY)
Admission: RE | Disposition: A | Discharge status: Home Health | Payer: Self-pay | Source: Ambulatory Visit | Attending: Orthopedic Surgery

## 2016-05-23 DIAGNOSIS — I110 Hypertensive heart disease with heart failure: Secondary | ICD-10-CM | POA: Diagnosis present

## 2016-05-23 DIAGNOSIS — I5042 Chronic combined systolic (congestive) and diastolic (congestive) heart failure: Secondary | ICD-10-CM | POA: Diagnosis not present

## 2016-05-23 DIAGNOSIS — I872 Venous insufficiency (chronic) (peripheral): Secondary | ICD-10-CM | POA: Diagnosis not present

## 2016-05-23 DIAGNOSIS — E119 Type 2 diabetes mellitus without complications: Secondary | ICD-10-CM | POA: Diagnosis not present

## 2016-05-23 DIAGNOSIS — E7800 Pure hypercholesterolemia, unspecified: Secondary | ICD-10-CM | POA: Diagnosis present

## 2016-05-23 DIAGNOSIS — H409 Unspecified glaucoma: Secondary | ICD-10-CM | POA: Diagnosis not present

## 2016-05-23 DIAGNOSIS — Y835 Amputation of limb(s) as the cause of abnormal reaction of the patient, or of later complication, without mention of misadventure at the time of the procedure: Secondary | ICD-10-CM | POA: Diagnosis present

## 2016-05-23 DIAGNOSIS — Z833 Family history of diabetes mellitus: Secondary | ICD-10-CM | POA: Diagnosis not present

## 2016-05-23 DIAGNOSIS — Z888 Allergy status to other drugs, medicaments and biological substances status: Secondary | ICD-10-CM | POA: Diagnosis not present

## 2016-05-23 DIAGNOSIS — Z9981 Dependence on supplemental oxygen: Secondary | ICD-10-CM

## 2016-05-23 DIAGNOSIS — I739 Peripheral vascular disease, unspecified: Secondary | ICD-10-CM | POA: Diagnosis present

## 2016-05-23 DIAGNOSIS — E890 Postprocedural hypothyroidism: Secondary | ICD-10-CM | POA: Diagnosis not present

## 2016-05-23 DIAGNOSIS — L97919 Non-pressure chronic ulcer of unspecified part of right lower leg with unspecified severity: Secondary | ICD-10-CM | POA: Diagnosis not present

## 2016-05-23 DIAGNOSIS — I272 Pulmonary hypertension, unspecified: Secondary | ICD-10-CM | POA: Diagnosis not present

## 2016-05-23 DIAGNOSIS — I482 Chronic atrial fibrillation: Secondary | ICD-10-CM | POA: Diagnosis not present

## 2016-05-23 DIAGNOSIS — T8781 Dehiscence of amputation stump: Principal | ICD-10-CM | POA: Diagnosis present

## 2016-05-23 DIAGNOSIS — M301 Polyarteritis with lung involvement [Churg-Strauss]: Secondary | ICD-10-CM | POA: Diagnosis not present

## 2016-05-23 DIAGNOSIS — M8580 Other specified disorders of bone density and structure, unspecified site: Secondary | ICD-10-CM | POA: Diagnosis present

## 2016-05-23 DIAGNOSIS — S78111A Complete traumatic amputation at level between right hip and knee, initial encounter: Secondary | ICD-10-CM

## 2016-05-23 DIAGNOSIS — I70261 Atherosclerosis of native arteries of extremities with gangrene, right leg: Secondary | ICD-10-CM | POA: Diagnosis not present

## 2016-05-23 DIAGNOSIS — Z89511 Acquired absence of right leg below knee: Secondary | ICD-10-CM

## 2016-05-23 DIAGNOSIS — E1142 Type 2 diabetes mellitus with diabetic polyneuropathy: Secondary | ICD-10-CM | POA: Diagnosis not present

## 2016-05-23 DIAGNOSIS — I4891 Unspecified atrial fibrillation: Secondary | ICD-10-CM | POA: Diagnosis not present

## 2016-05-23 HISTORY — PX: AMPUTATION: SHX166

## 2016-05-23 HISTORY — DX: Family history of other specified conditions: Z84.89

## 2016-05-23 LAB — COMPREHENSIVE METABOLIC PANEL
ALBUMIN: 2.9 g/dL — AB (ref 3.5–5.0)
ALK PHOS: 87 U/L (ref 38–126)
ALT: 5 U/L — AB (ref 14–54)
AST: 14 U/L — AB (ref 15–41)
Anion gap: 9 (ref 5–15)
BUN: 10 mg/dL (ref 6–20)
CALCIUM: 8.1 mg/dL — AB (ref 8.9–10.3)
CO2: 25 mmol/L (ref 22–32)
CREATININE: 0.63 mg/dL (ref 0.44–1.00)
Chloride: 103 mmol/L (ref 101–111)
GFR calc Af Amer: >60 mL/min (ref >60)
GFR calc non Af Amer: >60 mL/min (ref >60)
GLUCOSE: 94 mg/dL (ref 65–99)
Potassium: 3.5 mmol/L (ref 3.5–5.1)
SODIUM: 137 mmol/L (ref 135–145)
Total Bilirubin: 1 mg/dL (ref 0.3–1.2)
Total Protein: 6.4 g/dL — ABNORMAL LOW (ref 6.5–8.1)

## 2016-05-23 LAB — CBC
HCT: 33.4 % — ABNORMAL LOW (ref 36.0–46.0)
HEMOGLOBIN: 10.4 g/dL — AB (ref 12.0–15.0)
MCH: 29.5 pg (ref 26.0–34.0)
MCHC: 31.1 g/dL (ref 30.0–36.0)
MCV: 94.9 fL (ref 78.0–100.0)
Platelets: 300 10*3/uL (ref 150–400)
RBC: 3.52 MIL/uL — AB (ref 3.87–5.11)
RDW: 14.6 % (ref 11.5–15.5)
WBC: 4.1 10*3/uL (ref 4.0–10.5)

## 2016-05-23 LAB — PROTIME-INR
INR: 1.78
Prothrombin Time: 21 seconds — ABNORMAL HIGH (ref 11.4–15.2)

## 2016-05-23 LAB — APTT: APTT: 47 s — AB (ref 24–36)

## 2016-05-23 LAB — GLUCOSE, CAPILLARY
GLUCOSE-CAPILLARY: 139 mg/dL — AB (ref 65–99)
GLUCOSE-CAPILLARY: 141 mg/dL — AB (ref 65–99)
Glucose-Capillary: 89 mg/dL (ref 65–99)
Glucose-Capillary: 67 mg/dL (ref 65–99)
Glucose-Capillary: 69 mg/dL (ref 65–99)
Glucose-Capillary: 82 mg/dL (ref 65–99)

## 2016-05-23 SURGERY — AMPUTATION, ABOVE KNEE
Anesthesia: General | Laterality: Right

## 2016-05-23 MED ORDER — FENTANYL CITRATE (PF) 100 MCG/2ML IJ SOLN
INTRAMUSCULAR | Status: DC | PRN
  Administered 2016-05-23 (×2): 25 ug via INTRAVENOUS

## 2016-05-23 MED ORDER — LIDOCAINE 2% (20 MG/ML) 5 ML SYRINGE
INTRAMUSCULAR | Status: AC
  Filled 2016-05-23: qty 5

## 2016-05-23 MED ORDER — METOCLOPRAMIDE HCL 5 MG PO TABS: 5.0000 mg | ORAL_TABLET | Freq: Three times a day (TID) | ORAL | Status: DC | PRN

## 2016-05-23 MED ORDER — PROPOFOL 10 MG/ML IV BOLUS
INTRAVENOUS | Status: AC
  Filled 2016-05-23: qty 20

## 2016-05-23 MED ORDER — DEXTROSE-NACL 5-0.45 % IV SOLN
INTRAVENOUS | Status: DC
  Administered 2016-05-23: 22:00:00 via INTRAVENOUS

## 2016-05-23 MED ORDER — SUCCINYLCHOLINE CHLORIDE 20 MG/ML IJ SOLN
INTRAMUSCULAR | Status: DC | PRN
  Administered 2016-05-23: 80 mg via INTRAVENOUS

## 2016-05-23 MED ORDER — METOPROLOL TARTRATE 5 MG/5ML IV SOLN
5.0000 mg | Freq: Once | INTRAVENOUS | Status: AC
  Administered 2016-05-23: 5 mg via INTRAVENOUS

## 2016-05-23 MED ORDER — METOPROLOL TARTRATE 5 MG/5ML IV SOLN
INTRAVENOUS | Status: AC
  Filled 2016-05-23: qty 5

## 2016-05-23 MED ORDER — FENTANYL CITRATE (PF) 100 MCG/2ML IJ SOLN
25.0000 ug | Freq: Once | INTRAMUSCULAR | Status: AC
  Administered 2016-05-23: 25 ug via INTRAVENOUS

## 2016-05-23 MED ORDER — 0.9 % SODIUM CHLORIDE (POUR BTL) OPTIME
TOPICAL | Status: DC | PRN
  Administered 2016-05-23: 1000 mL

## 2016-05-23 MED ORDER — METHOCARBAMOL 500 MG PO TABS
500.0000 mg | ORAL_TABLET | Freq: Four times a day (QID) | ORAL | Status: DC | PRN
  Administered 2016-05-23: 500 mg via ORAL
  Filled 2016-05-23: qty 1

## 2016-05-23 MED ORDER — ONDANSETRON HCL 4 MG/2ML IJ SOLN
4.0000 mg | Freq: Four times a day (QID) | INTRAMUSCULAR | Status: DC | PRN
  Administered 2016-05-23: 4 mg via INTRAVENOUS
  Filled 2016-05-23: qty 2

## 2016-05-23 MED ORDER — FENTANYL CITRATE (PF) 100 MCG/2ML IJ SOLN
INTRAMUSCULAR | Status: AC
  Filled 2016-05-23: qty 2

## 2016-05-23 MED ORDER — METOCLOPRAMIDE HCL 5 MG/ML IJ SOLN: 5.0000 mg | Freq: Three times a day (TID) | INTRAMUSCULAR | Status: DC | PRN

## 2016-05-23 MED ORDER — METHOCARBAMOL 1000 MG/10ML IJ SOLN
500.0000 mg | Freq: Four times a day (QID) | INTRAVENOUS | Status: DC | PRN
  Filled 2016-05-23: qty 5

## 2016-05-23 MED ORDER — ACETAMINOPHEN 650 MG RE SUPP: 650.0000 mg | Freq: Four times a day (QID) | RECTAL | Status: DC | PRN

## 2016-05-23 MED ORDER — HYDROMORPHONE HCL 1 MG/ML IJ SOLN
0.5000 mg | INTRAMUSCULAR | Status: DC | PRN
  Administered 2016-05-23: 0.5 mg via INTRAVENOUS
  Filled 2016-05-23: qty 1

## 2016-05-23 MED ORDER — INSULIN ASPART 100 UNIT/ML ~~LOC~~ SOLN
0.0000 [IU] | Freq: Three times a day (TID) | SUBCUTANEOUS | Status: DC
  Administered 2016-05-23: 1 [IU] via SUBCUTANEOUS
  Administered 2016-05-24: 2 [IU] via SUBCUTANEOUS

## 2016-05-23 MED ORDER — PROMETHAZINE HCL 25 MG/ML IJ SOLN: 6.2500 mg | INTRAMUSCULAR | Status: DC | PRN

## 2016-05-23 MED ORDER — ONDANSETRON HCL 4 MG PO TABS: 4.0000 mg | ORAL_TABLET | Freq: Four times a day (QID) | ORAL | Status: DC | PRN

## 2016-05-23 MED ORDER — ETOMIDATE 2 MG/ML IV SOLN
INTRAVENOUS | Status: DC | PRN
  Administered 2016-05-23: 20 mg via INTRAVENOUS

## 2016-05-23 MED ORDER — HYDROCODONE-ACETAMINOPHEN 5-325 MG PO TABS
1.0000 | ORAL_TABLET | ORAL | Status: DC | PRN
  Administered 2016-05-23: 1 via ORAL
  Administered 2016-05-23: 2 via ORAL
  Administered 2016-05-24: 1 via ORAL
  Administered 2016-05-24 – 2016-05-25 (×2): 2 via ORAL
  Filled 2016-05-23: qty 1
  Filled 2016-05-23 (×4): qty 2

## 2016-05-23 MED ORDER — CEFAZOLIN IN D5W 1 GM/50ML IV SOLN
1.0000 g | Freq: Four times a day (QID) | INTRAVENOUS | Status: AC
  Administered 2016-05-23 – 2016-05-24 (×3): 1 g via INTRAVENOUS
  Filled 2016-05-23 (×3): qty 50

## 2016-05-23 MED ORDER — CHLORHEXIDINE GLUCONATE 4 % EX LIQD: 60.0000 mL | Freq: Once | CUTANEOUS | Status: DC

## 2016-05-23 MED ORDER — SODIUM CHLORIDE 0.9 % IV SOLN
INTRAVENOUS | Status: DC
  Administered 2016-05-23: 13:00:00 via INTRAVENOUS

## 2016-05-23 MED ORDER — CEFAZOLIN SODIUM-DEXTROSE 2-4 GM/100ML-% IV SOLN
2.0000 g | INTRAVENOUS | Status: AC
  Administered 2016-05-23: 2 g via INTRAVENOUS

## 2016-05-23 MED ORDER — FENTANYL CITRATE (PF) 100 MCG/2ML IJ SOLN: 25.0000 ug | INTRAMUSCULAR | Status: DC | PRN

## 2016-05-23 MED ORDER — LACTATED RINGERS IV SOLN
INTRAVENOUS | Status: DC
  Administered 2016-05-23: 50 mL/h via INTRAVENOUS

## 2016-05-23 MED ORDER — CEFAZOLIN SODIUM-DEXTROSE 2-4 GM/100ML-% IV SOLN
INTRAVENOUS | Status: AC
  Filled 2016-05-23: qty 100

## 2016-05-23 MED ORDER — LIDOCAINE HCL (CARDIAC) 20 MG/ML IV SOLN
INTRAVENOUS | Status: DC | PRN
  Administered 2016-05-23: 60 mg via INTRAVENOUS

## 2016-05-23 MED ORDER — ONDANSETRON HCL 4 MG/2ML IJ SOLN
INTRAMUSCULAR | Status: DC | PRN
  Administered 2016-05-23: 4 mg via INTRAVENOUS

## 2016-05-23 MED ORDER — ONDANSETRON HCL 4 MG/2ML IJ SOLN
INTRAMUSCULAR | Status: AC
  Filled 2016-05-23: qty 2

## 2016-05-23 MED ORDER — ACETAMINOPHEN 325 MG PO TABS: 650.0000 mg | ORAL_TABLET | Freq: Four times a day (QID) | ORAL | Status: DC | PRN

## 2016-05-23 MED ORDER — ESMOLOL HCL 100 MG/10ML IV SOLN
INTRAVENOUS | Status: DC | PRN
  Administered 2016-05-23: 10 mg via INTRAVENOUS
  Administered 2016-05-23 (×2): 20 mg via INTRAVENOUS

## 2016-05-23 MED ORDER — ETOMIDATE 2 MG/ML IV SOLN
INTRAVENOUS | Status: AC
  Filled 2016-05-23: qty 10

## 2016-05-23 SURGICAL SUPPLY — 45 items
BLADE SAW RECIP 87.9 MT (BLADE) ×3 IMPLANT
BNDG COHESIVE 6X5 TAN STRL LF (GAUZE/BANDAGES/DRESSINGS) ×6 IMPLANT
BNDG GAUZE ELAST 4 BULKY (GAUZE/BANDAGES/DRESSINGS) ×3 IMPLANT
COVER SURGICAL LIGHT HANDLE (MISCELLANEOUS) ×6 IMPLANT
CUFF TOURNIQUET SINGLE 34IN LL (TOURNIQUET CUFF) IMPLANT
CUFF TOURNIQUET SINGLE 44IN (TOURNIQUET CUFF) IMPLANT
DRAIN PENROSE 1/2X12 LTX STRL (WOUND CARE) IMPLANT
DRAPE EXTREMITY T 121X128X90 (DRAPE) ×3 IMPLANT
DRAPE PROXIMA HALF (DRAPES) ×3 IMPLANT
DRAPE U-SHAPE 47X51 STRL (DRAPES) ×6 IMPLANT
DRSG ADAPTIC 3X8 NADH LF (GAUZE/BANDAGES/DRESSINGS) ×3 IMPLANT
DRSG PAD ABDOMINAL 8X10 ST (GAUZE/BANDAGES/DRESSINGS) ×6 IMPLANT
DRSG VAC ATS LRG SENSATRAC (GAUZE/BANDAGES/DRESSINGS) ×2 IMPLANT
DURAPREP 26ML APPLICATOR (WOUND CARE) ×3 IMPLANT
ELECT CAUTERY BLADE 6.4 (BLADE) IMPLANT
ELECT REM PT RETURN 9FT ADLT (ELECTROSURGICAL) ×3
ELECTRODE REM PT RTRN 9FT ADLT (ELECTROSURGICAL) ×1 IMPLANT
EVACUATOR 1/8 PVC DRAIN (DRAIN) IMPLANT
GAUZE SPONGE 4X4 12PLY STRL (GAUZE/BANDAGES/DRESSINGS) ×3 IMPLANT
GLOVE BIOGEL PI IND STRL 9 (GLOVE) ×1 IMPLANT
GLOVE BIOGEL PI INDICATOR 9 (GLOVE) ×2
GLOVE SURG ORTHO 9.0 STRL STRW (GLOVE) ×3 IMPLANT
GOWN STRL REUS W/ TWL XL LVL3 (GOWN DISPOSABLE) ×2 IMPLANT
GOWN STRL REUS W/TWL XL LVL3 (GOWN DISPOSABLE) ×6
KIT BASIN OR (CUSTOM PROCEDURE TRAY) ×3 IMPLANT
KIT PREVENA INCISION MGT 13 (CANNISTER) ×2 IMPLANT
KIT ROOM TURNOVER OR (KITS) ×3 IMPLANT
MANIFOLD NEPTUNE II (INSTRUMENTS) ×3 IMPLANT
NS IRRIG 1000ML POUR BTL (IV SOLUTION) ×3 IMPLANT
PACK GENERAL/GYN (CUSTOM PROCEDURE TRAY) ×3 IMPLANT
PAD ARMBOARD 7.5X6 YLW CONV (MISCELLANEOUS) ×3 IMPLANT
PREVENA INCISION MGT 90 150 (MISCELLANEOUS) ×2 IMPLANT
STAPLER VISISTAT 35W (STAPLE) IMPLANT
STOCKINETTE IMPERVIOUS LG (DRAPES) IMPLANT
SUT ETHILON 2 0 PSLX (SUTURE) ×3 IMPLANT
SUT PDS AB 1 CT  36 (SUTURE)
SUT PDS AB 1 CT 36 (SUTURE) IMPLANT
SUT SILK 2 0 (SUTURE) ×3
SUT SILK 2-0 18XBRD TIE 12 (SUTURE) ×1 IMPLANT
SUT VIC AB 1 CTX 27 (SUTURE) ×3 IMPLANT
SWAB COLLECTION DEVICE MRSA (MISCELLANEOUS) IMPLANT
TOWEL OR 17X24 6PK STRL BLUE (TOWEL DISPOSABLE) ×3 IMPLANT
TOWEL OR 17X26 10 PK STRL BLUE (TOWEL DISPOSABLE) ×3 IMPLANT
TUBE ANAEROBIC SPECIMEN COL (MISCELLANEOUS) IMPLANT
WATER STERILE IRR 1000ML POUR (IV SOLUTION) ×3 IMPLANT

## 2016-05-23 NOTE — Anesthesia Procedure Notes (Signed)
Procedure Name: Intubation Date/Time: 05/23/2016 10:21 AM Performed by: Lucinda Dell Pre-anesthesia Checklist: Patient identified, Suction available, Patient being monitored and Emergency Drugs available Patient Re-evaluated:Patient Re-evaluated prior to inductionOxygen Delivery Method: Circle System Utilized Preoxygenation: Pre-oxygenation with 100% oxygen Intubation Type: IV induction Ventilation: Mask ventilation without difficulty and Oral airway inserted - appropriate to patient size Laryngoscope Size: Mac and 3 Grade View: Grade I Tube type: Oral Tube size: 7.5 mm Number of attempts: 1 Airway Equipment and Method: Stylet and Oral airway Placement Confirmation: ETT inserted through vocal cords under direct vision,  positive ETCO2 and breath sounds checked- equal and bilateral Secured at: 21 cm Tube secured with: Tape Dental Injury: Teeth and Oropharynx as per pre-operative assessment

## 2016-05-23 NOTE — Progress Notes (Signed)
[facility]  Patient Status: Active (receiving services up to time of hospitalization)  [facility] is providing the following services: RN and HHA  If patient discharges after hours, please call [phone].   [name] 05/23/2016, 2:53 PM

## 2016-05-23 NOTE — Progress Notes (Signed)
Hypoglycemic Event  CBG:67  Treatment: 15 GM carbohydrate snack  Symptoms: None  Follow-up CBG: Time:9:20pm CBG Result:69  Possible Reasons for Event: inadequate meal intake  Comments/MD notified:    Allena Earing

## 2016-05-23 NOTE — H&P (Signed)
Wendy BeaversHazel Broadus JohnWarren is an 82 y.o. female.   Chief Complaint: Dehiscence transtibial amputation HPI: Patient is an 82 year old woman with severe peripheral vascular disease. Patient has undergone limb salvage intervention and presents with dehiscence of her transtibial amputation.  Past Medical History:  Diagnosis Date  . Asthma   . Atrial fibrillation, permanent (HCC)    a. Dx 04/2013, on metoprolol, dig and xarelto  . Cataract    left eye  . CHF (congestive heart failure) (HCC)   . Chronic combined systolic and diastolic heart failure (HCC)    a. 02/2012 EF:20-25%  . Churg-Strauss syndrome (HCC)    a. Sural nerve biopsy 02/2000.  . Ectopic atrial tachycardia (HCC)    a. Documented on tele as IP 02/2012.  . Family history of adverse reaction to anesthesia    daughter had a headache and couldn't breath; thinks that she had a gas for anesthesia  . Glaucoma   . Grave's disease    a. s/p thyroidectomy.  . Hypercholesteremia   . Hypertension   . Hypokalemia    a. Previously felt due to diuretics, req supplementation.  . Lipoma    left inner thigh  . Multifocal atrial tachycardia (HCC)    a. Documented as OP 01/2012.  Marland Kitchen. NSVT (nonsustained ventricular tachycardia) (HCC)    a. During CHF adm 2001.  . On home oxygen therapy    "wear it mostly at night" (09/20/2014)  . Osteoarthritis    left shoulder  . Osteopenia    DEXA 2004  . Pericardial effusion    a. Echo 02/2012: small-mod pericardial effusion.  . Pleural effusion, left    a. Thoracentesis 2001 - per notes, no malignant cells, was transudative.  . Pneumonia 1930's  . Pseudogout   . Pulmonary HTN    a. Mod by echo 02/2012.  Marland Kitchen. Shortness of breath    With extertion  . Type II diabetes mellitus (HCC)   . Valvular heart disease    a. Echo 02/2012: mod MR/TR, mild AI.  Marland Kitchen. VASCULITIS   . Venous insufficiency     Past Surgical History:  Procedure Laterality Date  . ABDOMINAL HYSTERECTOMY    . AMPUTATION Right 03/21/2016   Procedure:  RIGHT BELOW KNEE AMPUTATION;  Surgeon: Nadara MustardMarcus V Danyell Shader, MD;  Location: [facility] OR;  Service: Orthopedics;  Laterality: Right;  . AMPUTATION  04/30/2016    Revision Right Below Knee Amputation with application of prevena  . BELOW KNEE LEG AMPUTATION Right 03/21/2016  . CATARACT EXTRACTION Right   . FRACTURE SURGERY    . HEMIARTHROPLASTY HIP Right 10/2010   cemented for hip fracture, femoral neck - by Eulas PostJoshua P Landau, MD  . PERIPHERAL VASCULAR CATHETERIZATION N/A 01/30/2016   Procedure: Abdominal Aortogram w/Lower Extremity;  Surgeon: Nada LibmanVance W Brabham, MD;  Location: [facility] INVASIVE CV LAB;  Service: Cardiovascular;  Laterality: N/A;  . PERIPHERAL VASCULAR CATHETERIZATION Right 01/30/2016   Procedure: Peripheral Vascular Balloon Angioplasty;  Surgeon: Nada LibmanVance W Brabham, MD;  Location: [facility] INVASIVE CV [facility];  Service: Cardiovascular;  Laterality: Right;  Posterior tibial  . SALPINGOOPHORECTOMY    . STUMP REVISION Right 04/30/2016   Procedure: Revision Right Below Knee Amputation with application of prevena;  Surgeon: Nadara MustardMarcus V Beatrice Sehgal, MD;  [facility];  Service: Orthopedics;  Laterality: Right;  . SURAL NERVE BIOPSY  02/2000  . THYROIDECTOMY      Family History  Problem Relation Age of Onset  . Diabetes Mother   . Diabetes Father   . Heart disease Neg  Hx    Social History:  reports that she has never smoked. She has never used smokeless tobacco. She reports that she does not drink alcohol or use drugs.  Allergies:  Allergies  Allergen Reactions  . Amiodarone Other (See Comments)    SEVERE PULMONARY TOXICITY PATIENT DEVELOPED HYPOXIA    No prescriptions prior to admission.    No results found for this or any previous visit (from the past 48 hour(s)). No results found.  Review of Systems  All other systems reviewed and are negative.   There were no vitals taken for this visit. Physical Exam  Examination patient is alert oriented no adenopathy well-dressed normal affect normal history for she  has complete dehiscence of the transtibial amputation with exposed bone and necrotic tissue. Assessment/Plan Assessment: Dehiscence right transtibial amputation.  Plan: We'll plan for right above-knee amputation risk and benefits were discussed including risk of the wound not healing. Patient and family state they understand and wish to proceed at this time.  Nadara Mustard, MD 05/23/2016, 6:33 AM

## 2016-05-23 NOTE — Op Note (Signed)
05/23/2016  10:53 AM  PATIENT:  [name]    PRE-OPERATIVE DIAGNOSIS:  Dehiscence Right Below Knee Amputation  POST-OPERATIVE DIAGNOSIS:  Same  PROCEDURE:  AMPUTATION ABOVE KNEE Application of Prevena wound VAC.  SURGEON:  Nadara Mustard, MD  PHYSICIAN ASSISTANT:None ANESTHESIA:   General  PREOPERATIVE INDICATIONS:  [name] is a  82 y.o. female with a diagnosis of Dehiscence Right Below Knee Amputation who failed conservative measures and elected for surgical management.    The risks benefits and alternatives were discussed with the patient preoperatively including but not limited to the risks of infection, bleeding, nerve injury, cardiopulmonary complications, the need for revision surgery, among others, and the patient was willing to proceed.  OPERATIVE IMPLANTS: Prevena wound VAC.  OPERATIVE FINDINGS: Severe calcified vessels  OPERATIVE PROCEDURE: Patient brought the operating room and underwent a general anesthetic. After adequate levels anesthesia obtained patient's right lower extremity was prepped using DuraPrep draped into a sterile field the necrotic below the knee amputation was draped out of the sterile field with sterile Ioban. A timeout was called. A fishmouth incision was made to the mid thigh. This was carried sharply down to bone. The bone was resected with a reciprocating saw. The vascular bundles were clamped and the leg was amputated through the mid thigh. The vascular bundles were suture ligated with 2-0 silk. Hemostasis was obtained. The deep and superficial fascial layers and skin was closed using 2-0 nylon. A Prevena wound VAC was applied this had a good suction fit patient was extubated taken to the PACU in stable condition.

## 2016-05-23 NOTE — Transfer of Care (Signed)
Immediate Anesthesia Transfer of Care Note  Patient: [name]  Procedure(s) Performed: Procedure(s): AMPUTATION ABOVE KNEE (Right)  Patient Location: PACU  Anesthesia Type:General  Level of Consciousness: awake, alert  and patient cooperative  Airway & Oxygen Therapy: Patient Spontanous Breathing and Patient connected to face mask oxygen  Post-op Assessment: Report given to RN and Post -op Vital signs reviewed and stable  Post vital signs: Reviewed and stable  Last Vitals:  Vitals:   05/23/16 0819 05/23/16 1125  BP: 123/65   Pulse: 85 (!) (P) 129  Resp: 18 (P) 20  Temp: 36.5 C     Last Pain:  Vitals:   05/23/16 0819  TempSrc: Oral      Patients Stated Pain Goal: 3 (05/23/16 0745)  Complications: No apparent anesthesia complications

## 2016-05-23 NOTE — Progress Notes (Signed)
Hypoglycemic Event  CBG: 69  Treatment: 15 GM carbohydrate snack  Symptoms: None  Follow-up CBG: Time:9:56pm CBG Result:82  Possible Reasons for Event: Inadequate meal intake  Comments/MD notified:MD duda- asked to start d5 1/2NS at 75cc/hr    Allena Earing

## 2016-05-23 NOTE — Progress Notes (Signed)
ok to transfer to floor per dr Armanda Magic MDA

## 2016-05-23 NOTE — Anesthesia Postprocedure Evaluation (Signed)
Anesthesia Post Note  Patient: [name]  Procedure(s) Performed: Procedure(s) (LRB): AMPUTATION ABOVE KNEE (Right)  Patient location during evaluation: PACU Anesthesia Type: General Level of consciousness: awake and alert Pain management: pain level controlled Vital Signs Assessment: post-procedure vital signs reviewed and stable Respiratory status: spontaneous breathing, nonlabored ventilation, respiratory function stable and patient connected to nasal cannula oxygen Cardiovascular status: blood pressure returned to baseline and stable Postop Assessment: no signs of nausea or vomiting Anesthetic complications: no    Last Vitals:  Vitals:   05/23/16 1210 05/23/16 1224  BP: 103/81 117/74  Pulse: (!) 106 100  Resp: 20 20  Temp:      Last Pain:  Vitals:   05/23/16 0819  TempSrc: Oral                 [name]

## 2016-05-23 NOTE — Anesthesia Preprocedure Evaluation (Signed)
Anesthesia Evaluation  Patient identified by MRN, date of birth, ID band Patient awake and Patient unresponsive    Reviewed: Allergy & Precautions, NPO status , Patient's Chart, lab work & pertinent test results  Airway Mallampati: II  TM Distance: >3 FB Neck ROM: Full    Dental  (+) Edentulous Lower, Edentulous Upper   Pulmonary shortness of breath, asthma , pneumonia, resolved,    Pulmonary exam normal breath sounds clear to auscultation       Cardiovascular hypertension, Pt. on medications + Peripheral Vascular Disease and +CHF  Normal cardiovascular exam Rhythm:Regular Rate:Normal  ECHO 01-29-16:  Study Conclusions  - Left ventricle: The cavity size was normal. Wall thickness was   increased in a pattern of mild LVH. Systolic function was   severely reduced. The estimated ejection fraction was in the   range of 25% to 30%. Diffuse hypokinesis. There is akinesis of   the basal-midinferoseptal myocardium. - Aortic valve: Trileaflet; mildly thickened, mildly calcified   leaflets. There was mild regurgitation. - Mitral valve: There was moderate regurgitation. - Left atrium: The atrium was severely dilated. Volume/bsa, S: 65.7   ml/m^2. - Right ventricle: The cavity size was mildly dilated. Wall   thickness was normal. Systolic function was moderately reduced. - Right atrium: The atrium was moderately dilated. - Tricuspid valve: There was severe regurgitation. - Pulmonary arteries: Systolic pressure was moderately increased.   PA peak pressure: 60 mm Hg (S). - Pericardium, extracardiac: A small pericardial effusion was   identified posterior to the heart. There was no evidence of   hemodynamic compromise.   Neuro/Psych negative neurological ROS  negative psych ROS   GI/Hepatic Neg liver ROS, GERD  ,  Endo/Other  diabetesS/'p thyroidectomy for graves disease, now resolved.  Renal/GU negative Renal ROS  negative  genitourinary   Musculoskeletal  (+) Arthritis ,   Abdominal   Peds negative pediatric ROS (+)  Hematology  (+) anemia ,   Anesthesia Other Findings   Reproductive/Obstetrics negative OB ROS                             Anesthesia Physical  Anesthesia Plan  ASA: IV  Anesthesia Plan: General   Post-op Pain Management:    Induction: Intravenous  Airway Management Planned: Oral ETT  Additional Equipment: Arterial line  Intra-op Plan:   Post-operative Plan: Extubation in OR  Informed Consent: I have reviewed the patients History and Physical, chart, labs and discussed the procedure including the risks, benefits and alternatives for the proposed anesthesia with the patient or authorized representative who has indicated his/her understanding and acceptance.   Dental advisory given  Plan Discussed with: CRNA and Surgeon  Anesthesia Plan Comments:         Anesthesia Quick Evaluation

## 2016-05-24 LAB — GLUCOSE, CAPILLARY
GLUCOSE-CAPILLARY: 139 mg/dL — AB (ref 65–99)
GLUCOSE-CAPILLARY: 190 mg/dL — AB (ref 65–99)
GLUCOSE-CAPILLARY: 151 mg/dL — AB (ref 65–99)
Glucose-Capillary: 127 mg/dL — ABNORMAL HIGH (ref 65–99)
Glucose-Capillary: 160 mg/dL — ABNORMAL HIGH (ref 65–99)

## 2016-05-24 MED ORDER — BRIMONIDINE TARTRATE 0.15 % OP SOLN
1.0000 [drp] | Freq: Two times a day (BID) | OPHTHALMIC | Status: DC
  Administered 2016-05-24 – 2016-05-25 (×3): 1 [drp] via OPHTHALMIC
  Filled 2016-05-24: qty 5

## 2016-05-24 MED ORDER — LISINOPRIL 20 MG PO TABS
20.0000 mg | ORAL_TABLET | Freq: Every day | ORAL | Status: DC
  Administered 2016-05-24 – 2016-05-25 (×2): 20 mg via ORAL
  Filled 2016-05-24 (×2): qty 1

## 2016-05-24 MED ORDER — FAMOTIDINE 20 MG PO TABS
10.0000 mg | ORAL_TABLET | Freq: Every day | ORAL | Status: DC
  Administered 2016-05-24 – 2016-05-25 (×2): 10 mg via ORAL
  Filled 2016-05-24 (×2): qty 1

## 2016-05-24 MED ORDER — RIVAROXABAN 15 MG PO TABS
15.0000 mg | ORAL_TABLET | Freq: Every day | ORAL | Status: DC
  Administered 2016-05-24: 15 mg via ORAL
  Filled 2016-05-24: qty 1

## 2016-05-24 MED ORDER — METFORMIN HCL 500 MG PO TABS
1000.0000 mg | ORAL_TABLET | Freq: Two times a day (BID) | ORAL | Status: DC
Start: 2016-05-24 — End: 2016-05-25
  Administered 2016-05-24 – 2016-05-25 (×2): 1000 mg via ORAL
  Filled 2016-05-24 (×2): qty 2

## 2016-05-24 MED ORDER — DORZOLAMIDE HCL 2 % OP SOLN
1.0000 [drp] | Freq: Two times a day (BID) | OPHTHALMIC | Status: DC
  Administered 2016-05-24 – 2016-05-25 (×3): 1 [drp] via OPHTHALMIC
  Filled 2016-05-24: qty 10

## 2016-05-24 MED ORDER — METOPROLOL SUCCINATE ER 100 MG PO TB24
100.0000 mg | ORAL_TABLET | ORAL | Status: DC
  Administered 2016-05-25: 100 mg via ORAL
  Filled 2016-05-24 (×2): qty 1

## 2016-05-24 MED ORDER — MOMETASONE FURO-FORMOTEROL FUM 100-5 MCG/ACT IN AERO
2.0000 | INHALATION_SPRAY | Freq: Two times a day (BID) | RESPIRATORY_TRACT | Status: DC
  Administered 2016-05-24 – 2016-05-25 (×2): 2 via RESPIRATORY_TRACT
  Filled 2016-05-24: qty 8.8

## 2016-05-24 MED ORDER — SPIRONOLACTONE 25 MG PO TABS
25.0000 mg | ORAL_TABLET | Freq: Every day | ORAL | Status: DC
  Administered 2016-05-24 – 2016-05-25 (×2): 25 mg via ORAL
  Filled 2016-05-24 (×2): qty 1

## 2016-05-24 MED ORDER — ALBUTEROL SULFATE (2.5 MG/3ML) 0.083% IN NEBU: 2.5000 mg | INHALATION_SOLUTION | Freq: Four times a day (QID) | RESPIRATORY_TRACT | Status: DC | PRN

## 2016-05-24 NOTE — Progress Notes (Signed)
Patient ID: [name], female   DOB: 10-Dec-1925, 82 y.o.   MRN: [id number] Patient is resting comfortably this morning complaints. Patient and family wish for discharge to home. Dressing is clean and dry wound VAC pump minimal drainage.

## 2016-05-24 NOTE — Evaluation (Signed)
Physical Therapy Evaluation Patient Details Name: [name] MRN: [id number] DOB: 06-16-1926 Today's Date: 05/24/2016   History of Present Illness  Pt is an 82 y/o female s/p R transtibial amputation revision secondary to dehiscence. PMH including but not limited to PVD, A-fib, CHF, HTN, DM, Grave's disease and original R transtibial amputation on 03/21/2016.  Clinical Impression  Pt presented supine in bed with HOB elevated. Pt was very lethargic throughout evaluation which greatly limited her participation. Pt's daughter was present and provided all history. Pt's daughter stated that pt is total A for all functional mobility and ADLs prior to admission. Pt and her family are all very adamant about her returning home vs d/c'ing to a SNF. Pt would continue to benefit from skilled physical therapy services at this time while admitted and after d/c to address her below listed limitations in order to improve her overall safety and independence with functional mobility.     Follow Up Recommendations Home health PT;Supervision/Assistance - 24 hour;Other (comment) (family is adamant about pt returning home; will not consider SNF)    Equipment Recommendations  None recommended by PT    Recommendations for Other Services       Precautions / Restrictions Precautions Precautions: Fall Restrictions Weight Bearing Restrictions: Yes RLE Weight Bearing: Non weight bearing      Mobility  Bed Mobility Overal bed mobility: Needs Assistance Bed Mobility: Supine to Sit     Supine to sit: HOB elevated (pt unable to achieve sitting position with max A; used HOB)     General bed mobility comments: pt very limited secondary to lethargy and weakness. Unable to achieve sitting EOB at this time, even with HOB elevated to max height.  Transfers                    Ambulation/Gait                Stairs            Wheelchair Mobility    Modified Rankin (Stroke Patients Only)        Balance                                             Pertinent Vitals/Pain Pain Assessment: Faces Faces Pain Scale: Hurts a little bit Pain Location: R residual limb Pain Descriptors / Indicators: Sore Pain Intervention(s): Monitored during session    Home Living Family/patient expects to be discharged to:: Private residence Living Arrangements: Children;Other relatives Available Help at Discharge: Family;Available 24 hours/day Type of Home: House Home Access: Ramped entrance       Home Equipment: Walker - 2 wheels;Bedside commode;Wheelchair - manual      Prior Function Level of Independence: Needs assistance   Gait / Transfers Assistance Needed: pt's daughter stated that pt was non-ambulatory prior to admission  ADL's / Homemaking Assistance Needed: pt's daughter stated that she required total A for all ADLs and IADLs        Hand Dominance        Extremity/Trunk Assessment   Upper Extremity Assessment: Generalized weakness           Lower Extremity Assessment: RLE deficits/detail;LLE deficits/detail RLE Deficits / Details: pt with no active movement of residual limb. Pt reported sensation grossly intact to light touch. LLE Deficits / Details: MMT revealed 2/5 for hip flexion, hip abduction, hip adduction,  knee flexion, knee extension and ankle DF.     Communication   Communication: Expressive difficulties;HOH  Cognition Arousal/Alertness: Lethargic Behavior During Therapy: WFL for tasks assessed/performed;Flat affect Overall Cognitive Status: Difficult to assess                      General Comments General comments (skin integrity, edema, etc.): very limited evaluation secondary to lethargy    Exercises General Exercises - Lower Extremity Ankle Circles/Pumps: AROM;AAROM;Left;10 reps;Supine Heel Slides: AAROM;Left;10 reps;Supine Hip ABduction/ADduction: AAROM;Left;10 reps;Supine Other Exercises Other Exercises: Light  touch/desensatization activity for R residual limb. PT demonstrated and instructed pt and pt's daughter.   Assessment/Plan    PT Assessment Patient needs continued PT services  PT Problem List Decreased strength;Decreased range of motion;Decreased activity tolerance;Decreased balance;Decreased mobility;Decreased coordination;Decreased knowledge of use of DME;Decreased safety awareness;Pain          PT Treatment Interventions DME instruction;Functional mobility training;Therapeutic activities;Therapeutic exercise;Balance training;Neuromuscular re-education;Patient/family education    PT Goals (Current goals can be found in the Care Plan section)  Acute Rehab PT Goals Patient Stated Goal: return home PT Goal Formulation: With patient/family Time For Goal Achievement: 05/31/16 Potential to Achieve Goals: Fair    Frequency Min 5X/week   Barriers to discharge        Co-evaluation               End of Session Equipment Utilized During Treatment: Oxygen Activity Tolerance: Patient limited by lethargy;Patient limited by pain Patient left: in bed;with call bell/phone within reach;with family/visitor present Nurse Communication: Mobility status         Time: 7858-8502 PT Time Calculation (min) (ACUTE ONLY): 20 min   Charges:   PT Evaluation $PT Eval Moderate Complexity: 1 Procedure     PT G CodesAlessandra Bevels Chandy Lowery 05/24/2016, 5:51 PM Deborah Chalk, PT, DPT [phone]

## 2016-05-25 LAB — GLUCOSE, CAPILLARY: GLUCOSE-CAPILLARY: 97 mg/dL (ref 65–99)

## 2016-05-25 MED ORDER — HYDROCODONE-ACETAMINOPHEN 5-325 MG PO TABS: 1.0000 | ORAL_TABLET | ORAL | 0 refills | Status: AC | PRN

## 2016-05-25 NOTE — Discharge Summary (Signed)
Discharge Diagnoses:  Active Problems:   Above knee amputation of right lower extremity (HCC)   Surgeries: Procedure(s): AMPUTATION ABOVE KNEE on 05/23/2016    Consultants:  none  Discharged Condition: Improved  Hospital Course: [name] is an 81 y.o. female who was admitted 05/23/2016 with a chief complaint of right leg pain, and found to have a diagnosis of Dehiscence Right Below Knee Amputation.  Patient was brought to the operating room on 05/23/2016 and underwent Procedure(s): AMPUTATION ABOVE KNEE.    Patient was given perioperative antibiotics: Anti-infectives    Start     Dose/Rate Route Frequency Ordered Stop   05/23/16 1600  ceFAZolin (ANCEF) IVPB 1 g/50 mL premix     1 g 100 mL/hr over 30 Minutes Intravenous Every 6 hours 05/23/16 1258 05/24/16 0334   05/23/16 0800  ceFAZolin (ANCEF) IVPB 2g/100 mL premix     2 g 200 mL/hr over 30 Minutes Intravenous On call to O.R. 05/23/16 0732 05/23/16 1024   05/23/16 0734  ceFAZolin (ANCEF) 2-4 GM/100ML-% IVPB    Comments:  Ray Church   : cabinet override      05/23/16 0734 05/23/16 1944   05/23/16 0733  ceFAZolin (ANCEF) 2-4 GM/100ML-% IVPB    Comments:  Ray Church   : cabinet override      05/23/16 0733 05/23/16 1024    .  Patient was given sequential compression devices, early ambulation, and aspirin for DVT prophylaxis.  Recent vital signs: Patient Vitals for the past 24 hrs:  BP Temp Temp src Pulse Resp SpO2  05/25/16 0657 104/73 - - 90 - -  05/25/16 0435 99/72 98.2 F (36.8 C) Oral 88 18 100 %  05/24/16 2056 103/69 98.9 F (37.2 C) Oral 83 - 100 %  05/24/16 2053 - - - - - 99 %  05/24/16 1351 (!) 102/53 98.5 F (36.9 C) Oral 78 16 100 %  .  Recent laboratory studies: No results found.  Discharge Medications:     Medication List    TAKE these medications   acetaminophen 500 MG tablet Commonly known as:  TYLENOL Take 1 tablet (500 mg total) by mouth every 6 (six) hours as needed for mild pain (or  Fever >/= 101). Please do not take more than 6 tablets today.   albuterol 108 (90 Base) MCG/ACT inhaler Commonly known as:  PROVENTIL HFA;VENTOLIN HFA Inhale 2 puffs into the lungs every 6 (six) hours as needed for wheezing or shortness of breath.   BD ULTRA-FINE LANCETS lancets Use to check blood sugar one time daily   brimonidine 0.1 % Soln Commonly known as:  ALPHAGAN P Place 1 drop into the left eye every 12 (twelve) hours.   cetirizine 5 MG tablet Commonly known as:  ZYRTEC Take 1 tablet (5 mg total) by mouth daily.   dorzolamide 2 % ophthalmic solution Commonly known as:  TRUSOPT Place 1 drop into the left eye 2 (two) times daily.   DULERA 100-5 MCG/ACT Aero Generic drug:  mometasone-formoterol INHALE 2 PUFFS INTO THE LUNGS TWICE DAILY   ferrous sulfate 325 (65 FE) MG tablet TAKE 1 TABLET(325 MG) BY MOUTH DAILY   glucose blood test strip Use as instructed   HYDROcodone-acetaminophen 5-325 MG tablet Commonly known as:  NORCO/VICODIN Take 1-2 tablets by mouth every 4 (four) hours as needed (breakthrough pain).   lisinopril 10 MG tablet Commonly known as:  PRINIVIL,ZESTRIL Take 1 tablet (10 mg total) by mouth daily.   lisinopril 20 MG tablet Commonly known  as:  PRINIVIL,ZESTRIL Take 20 mg by mouth daily.   metFORMIN 1000 MG tablet Commonly known as:  GLUCOPHAGE TAKE 1 TABLET(1000 MG) BY MOUTH TWICE DAILY WITH A MEAL   metoprolol succinate 100 MG 24 hr tablet Commonly known as:  TOPROL-XL Take 1 tablet daily What changed:  how much to take  how to take this  when to take this  additional instructions   ondansetron 4 MG tablet Commonly known as:  ZOFRAN Take 1 tablet (4 mg total) by mouth every 8 (eight) hours as needed for nausea or vomiting.   oxyCODONE-acetaminophen 5-325 MG tablet Commonly known as:  ROXICET Take 1 tablet by mouth every 4 (four) hours as needed for severe pain.   ranitidine 75 MG tablet Commonly known as:  ZANTAC Take 75 mg  by mouth daily as needed for heartburn (or nausea).   Rivaroxaban 15 MG Tabs tablet Commonly known as:  XARELTO Take 1 tablet (15 mg total) by mouth daily with supper. What changed:  when to take this   spironolactone 25 MG tablet Commonly known as:  ALDACTONE TAKE 1 TABLET BY MOUTH EVERY DAY   traMADol 50 MG tablet Commonly known as:  ULTRAM Take 1 tablet (50 mg total) by mouth every 6 (six) hours as needed. What changed:  reasons to take this       Diagnostic Studies: No results found.  Patient benefited maximally from their hospital stay and there were no complications.     Disposition: 01-Home or Self Care Discharge Instructions    Call MD / Call 911    Complete by:  As directed    If you experience chest pain or shortness of breath, CALL 911 and be transported to the hospital emergency room.  If you develope a fever above 101 F, pus (white drainage) or increased drainage or redness at the wound, or calf pain, call your surgeon's office.   Constipation Prevention    Complete by:  As directed    Drink plenty of fluids.  Prune juice may be helpful.  You may use a stool softener, such as Colace (over the counter) 100 mg twice a day.  Use MiraLax (over the counter) for constipation as needed.   Diet - low sodium heart healthy    Complete by:  As directed    Increase activity slowly as tolerated    Complete by:  As directed      Follow-up Information    Nadara MustardMarcus V Duda, MD Follow up in 2 week(s).   Specialty:  Orthopedic Surgery Contact information: [address] [place] [address] [phone]            Signed: Nadara MustardMarcus V Duda 05/25/2016, 8:19 AM

## 2016-05-25 NOTE — Care Management Note (Addendum)
Case Management Note  Patient Details  Name: Loreane Teats MRN: [id number] Date of Birth: [birth year]  Subjective/Objective:                    Action/Plan: Discharge planning Expected Discharge Date:                  Expected Discharge Plan:  Home w Home Health Services  In-House Referral:     Discharge planning Services  CM Consult  Post Acute Care Choice:  Home Health Choice offered to:  Patient, Adult Children  DME Arranged:  N/A DME Agency:  NA  HH Arranged:  PT HH Agency:  [facility]  Status of Service:  Completed, signed off  If discussed at Long Length of Stay Meetings, dates discussed:    Additional Comments: CM spoke with pt and daughter to confirm [facility] is active agency for home health and they have deferred to [name], [phone].  Pt has all DME needed at home.  [name] lives with pt.  SNF has been refused. CM has requested RN to get [facility] orders and face to face. CM has left message (with no identifiers per HIPAA) and waiting for callback. Pt will have [facility] to render HHPT/RN/Aide.  No other CM needs were communicated.  Yves Dill, RN 05/25/2016, 9:28 AM

## 2016-05-26 ENCOUNTER — Encounter (HOSPITAL_COMMUNITY): Payer: Self-pay | Admitting: Orthopedic Surgery

## 2016-05-26 ENCOUNTER — Telehealth: Payer: Self-pay

## 2016-05-26 DIAGNOSIS — I509 Heart failure, unspecified: Secondary | ICD-10-CM | POA: Diagnosis not present

## 2016-05-27 DIAGNOSIS — Z7984 Long term (current) use of oral hypoglycemic drugs: Secondary | ICD-10-CM | POA: Diagnosis not present

## 2016-05-27 DIAGNOSIS — Z7901 Long term (current) use of anticoagulants: Secondary | ICD-10-CM | POA: Diagnosis not present

## 2016-05-27 DIAGNOSIS — Z4781 Encounter for orthopedic aftercare following surgical amputation: Secondary | ICD-10-CM | POA: Diagnosis not present

## 2016-05-27 DIAGNOSIS — E1151 Type 2 diabetes mellitus with diabetic peripheral angiopathy without gangrene: Secondary | ICD-10-CM | POA: Diagnosis not present

## 2016-05-27 DIAGNOSIS — Z9981 Dependence on supplemental oxygen: Secondary | ICD-10-CM | POA: Diagnosis not present

## 2016-05-27 DIAGNOSIS — Z7951 Long term (current) use of inhaled steroids: Secondary | ICD-10-CM | POA: Diagnosis not present

## 2016-05-27 DIAGNOSIS — Z89511 Acquired absence of right leg below knee: Secondary | ICD-10-CM | POA: Diagnosis not present

## 2016-05-27 DIAGNOSIS — I110 Hypertensive heart disease with heart failure: Secondary | ICD-10-CM | POA: Diagnosis not present

## 2016-05-27 DIAGNOSIS — I4891 Unspecified atrial fibrillation: Secondary | ICD-10-CM | POA: Diagnosis not present

## 2016-05-27 DIAGNOSIS — I5042 Chronic combined systolic (congestive) and diastolic (congestive) heart failure: Secondary | ICD-10-CM | POA: Diagnosis not present

## 2016-05-28 ENCOUNTER — Inpatient Hospital Stay (INDEPENDENT_AMBULATORY_CARE_PROVIDER_SITE_OTHER): Payer: Medicare Other | Admitting: Orthopedic Surgery

## 2016-05-30 DIAGNOSIS — E1151 Type 2 diabetes mellitus with diabetic peripheral angiopathy without gangrene: Secondary | ICD-10-CM | POA: Diagnosis not present

## 2016-05-30 DIAGNOSIS — I110 Hypertensive heart disease with heart failure: Secondary | ICD-10-CM | POA: Diagnosis not present

## 2016-05-30 DIAGNOSIS — Z4781 Encounter for orthopedic aftercare following surgical amputation: Secondary | ICD-10-CM | POA: Diagnosis not present

## 2016-05-30 DIAGNOSIS — I4891 Unspecified atrial fibrillation: Secondary | ICD-10-CM | POA: Diagnosis not present

## 2016-05-30 DIAGNOSIS — I5042 Chronic combined systolic (congestive) and diastolic (congestive) heart failure: Secondary | ICD-10-CM | POA: Diagnosis not present

## 2016-05-30 DIAGNOSIS — Z9981 Dependence on supplemental oxygen: Secondary | ICD-10-CM | POA: Diagnosis not present

## 2016-05-30 DIAGNOSIS — Z7951 Long term (current) use of inhaled steroids: Secondary | ICD-10-CM | POA: Diagnosis not present

## 2016-05-30 DIAGNOSIS — Z7901 Long term (current) use of anticoagulants: Secondary | ICD-10-CM | POA: Diagnosis not present

## 2016-05-30 DIAGNOSIS — Z89511 Acquired absence of right leg below knee: Secondary | ICD-10-CM | POA: Diagnosis not present

## 2016-05-30 DIAGNOSIS — Z7984 Long term (current) use of oral hypoglycemic drugs: Secondary | ICD-10-CM | POA: Diagnosis not present

## 2016-05-31 ENCOUNTER — Other Ambulatory Visit: Payer: Self-pay | Admitting: Internal Medicine

## 2016-06-02 ENCOUNTER — Inpatient Hospital Stay (INDEPENDENT_AMBULATORY_CARE_PROVIDER_SITE_OTHER): Payer: Medicare Other | Admitting: Orthopedic Surgery

## 2016-06-02 DIAGNOSIS — Z9981 Dependence on supplemental oxygen: Secondary | ICD-10-CM | POA: Diagnosis not present

## 2016-06-02 DIAGNOSIS — E1151 Type 2 diabetes mellitus with diabetic peripheral angiopathy without gangrene: Secondary | ICD-10-CM | POA: Diagnosis not present

## 2016-06-02 DIAGNOSIS — Z7984 Long term (current) use of oral hypoglycemic drugs: Secondary | ICD-10-CM | POA: Diagnosis not present

## 2016-06-02 DIAGNOSIS — Z7951 Long term (current) use of inhaled steroids: Secondary | ICD-10-CM | POA: Diagnosis not present

## 2016-06-02 DIAGNOSIS — Z89511 Acquired absence of right leg below knee: Secondary | ICD-10-CM | POA: Diagnosis not present

## 2016-06-02 DIAGNOSIS — I4891 Unspecified atrial fibrillation: Secondary | ICD-10-CM | POA: Diagnosis not present

## 2016-06-02 DIAGNOSIS — Z4781 Encounter for orthopedic aftercare following surgical amputation: Secondary | ICD-10-CM | POA: Diagnosis not present

## 2016-06-02 DIAGNOSIS — Z7901 Long term (current) use of anticoagulants: Secondary | ICD-10-CM | POA: Diagnosis not present

## 2016-06-02 DIAGNOSIS — Z89611 Acquired absence of right leg above knee: Secondary | ICD-10-CM

## 2016-06-02 DIAGNOSIS — I5042 Chronic combined systolic (congestive) and diastolic (congestive) heart failure: Secondary | ICD-10-CM | POA: Diagnosis not present

## 2016-06-02 DIAGNOSIS — I110 Hypertensive heart disease with heart failure: Secondary | ICD-10-CM | POA: Diagnosis not present

## 2016-06-02 NOTE — Telephone Encounter (Signed)
appt 06/05/2016 with pcp

## 2016-06-02 NOTE — Telephone Encounter (Signed)
appt 06/05/2016 with pcp 

## 2016-06-03 DIAGNOSIS — Z89511 Acquired absence of right leg below knee: Secondary | ICD-10-CM | POA: Diagnosis not present

## 2016-06-03 DIAGNOSIS — I5042 Chronic combined systolic (congestive) and diastolic (congestive) heart failure: Secondary | ICD-10-CM | POA: Diagnosis not present

## 2016-06-03 DIAGNOSIS — E1151 Type 2 diabetes mellitus with diabetic peripheral angiopathy without gangrene: Secondary | ICD-10-CM | POA: Diagnosis not present

## 2016-06-03 DIAGNOSIS — Z9981 Dependence on supplemental oxygen: Secondary | ICD-10-CM | POA: Diagnosis not present

## 2016-06-03 DIAGNOSIS — I110 Hypertensive heart disease with heart failure: Secondary | ICD-10-CM | POA: Diagnosis not present

## 2016-06-03 DIAGNOSIS — I4891 Unspecified atrial fibrillation: Secondary | ICD-10-CM | POA: Diagnosis not present

## 2016-06-03 DIAGNOSIS — Z7984 Long term (current) use of oral hypoglycemic drugs: Secondary | ICD-10-CM | POA: Diagnosis not present

## 2016-06-03 DIAGNOSIS — Z7951 Long term (current) use of inhaled steroids: Secondary | ICD-10-CM | POA: Diagnosis not present

## 2016-06-03 DIAGNOSIS — Z4781 Encounter for orthopedic aftercare following surgical amputation: Secondary | ICD-10-CM | POA: Diagnosis not present

## 2016-06-03 DIAGNOSIS — Z7901 Long term (current) use of anticoagulants: Secondary | ICD-10-CM | POA: Diagnosis not present

## 2016-06-05 ENCOUNTER — Encounter: Payer: Medicare Other | Admitting: Internal Medicine

## 2016-06-05 ENCOUNTER — Encounter: Payer: Self-pay | Admitting: Internal Medicine

## 2016-06-05 DIAGNOSIS — I5042 Chronic combined systolic (congestive) and diastolic (congestive) heart failure: Secondary | ICD-10-CM | POA: Diagnosis not present

## 2016-06-05 DIAGNOSIS — Z7984 Long term (current) use of oral hypoglycemic drugs: Secondary | ICD-10-CM | POA: Diagnosis not present

## 2016-06-05 DIAGNOSIS — Z89511 Acquired absence of right leg below knee: Secondary | ICD-10-CM | POA: Diagnosis not present

## 2016-06-05 DIAGNOSIS — E1151 Type 2 diabetes mellitus with diabetic peripheral angiopathy without gangrene: Secondary | ICD-10-CM | POA: Diagnosis not present

## 2016-06-05 DIAGNOSIS — Z7901 Long term (current) use of anticoagulants: Secondary | ICD-10-CM | POA: Diagnosis not present

## 2016-06-05 DIAGNOSIS — I4891 Unspecified atrial fibrillation: Secondary | ICD-10-CM | POA: Diagnosis not present

## 2016-06-05 DIAGNOSIS — I110 Hypertensive heart disease with heart failure: Secondary | ICD-10-CM | POA: Diagnosis not present

## 2016-06-05 DIAGNOSIS — Z7951 Long term (current) use of inhaled steroids: Secondary | ICD-10-CM | POA: Diagnosis not present

## 2016-06-05 DIAGNOSIS — Z9981 Dependence on supplemental oxygen: Secondary | ICD-10-CM | POA: Diagnosis not present

## 2016-06-05 DIAGNOSIS — Z4781 Encounter for orthopedic aftercare following surgical amputation: Secondary | ICD-10-CM | POA: Diagnosis not present

## 2016-06-09 DIAGNOSIS — Z89511 Acquired absence of right leg below knee: Secondary | ICD-10-CM | POA: Diagnosis not present

## 2016-06-09 DIAGNOSIS — Z9981 Dependence on supplemental oxygen: Secondary | ICD-10-CM | POA: Diagnosis not present

## 2016-06-09 DIAGNOSIS — I4891 Unspecified atrial fibrillation: Secondary | ICD-10-CM | POA: Diagnosis not present

## 2016-06-09 DIAGNOSIS — Z7901 Long term (current) use of anticoagulants: Secondary | ICD-10-CM | POA: Diagnosis not present

## 2016-06-09 DIAGNOSIS — I5042 Chronic combined systolic (congestive) and diastolic (congestive) heart failure: Secondary | ICD-10-CM | POA: Diagnosis not present

## 2016-06-09 DIAGNOSIS — Z7951 Long term (current) use of inhaled steroids: Secondary | ICD-10-CM | POA: Diagnosis not present

## 2016-06-09 DIAGNOSIS — Z4781 Encounter for orthopedic aftercare following surgical amputation: Secondary | ICD-10-CM | POA: Diagnosis not present

## 2016-06-09 DIAGNOSIS — I110 Hypertensive heart disease with heart failure: Secondary | ICD-10-CM | POA: Diagnosis not present

## 2016-06-09 DIAGNOSIS — E1151 Type 2 diabetes mellitus with diabetic peripheral angiopathy without gangrene: Secondary | ICD-10-CM | POA: Diagnosis not present

## 2016-06-09 DIAGNOSIS — Z7984 Long term (current) use of oral hypoglycemic drugs: Secondary | ICD-10-CM | POA: Diagnosis not present

## 2016-06-12 DIAGNOSIS — Z7901 Long term (current) use of anticoagulants: Secondary | ICD-10-CM | POA: Diagnosis not present

## 2016-06-12 DIAGNOSIS — Z89511 Acquired absence of right leg below knee: Secondary | ICD-10-CM | POA: Diagnosis not present

## 2016-06-12 DIAGNOSIS — Z4781 Encounter for orthopedic aftercare following surgical amputation: Secondary | ICD-10-CM | POA: Diagnosis not present

## 2016-06-12 DIAGNOSIS — E1151 Type 2 diabetes mellitus with diabetic peripheral angiopathy without gangrene: Secondary | ICD-10-CM | POA: Diagnosis not present

## 2016-06-12 DIAGNOSIS — Z7984 Long term (current) use of oral hypoglycemic drugs: Secondary | ICD-10-CM | POA: Diagnosis not present

## 2016-06-12 DIAGNOSIS — I110 Hypertensive heart disease with heart failure: Secondary | ICD-10-CM | POA: Diagnosis not present

## 2016-06-12 DIAGNOSIS — Z7951 Long term (current) use of inhaled steroids: Secondary | ICD-10-CM | POA: Diagnosis not present

## 2016-06-12 DIAGNOSIS — Z9981 Dependence on supplemental oxygen: Secondary | ICD-10-CM | POA: Diagnosis not present

## 2016-06-12 DIAGNOSIS — I4891 Unspecified atrial fibrillation: Secondary | ICD-10-CM | POA: Diagnosis not present

## 2016-06-12 DIAGNOSIS — I5042 Chronic combined systolic (congestive) and diastolic (congestive) heart failure: Secondary | ICD-10-CM | POA: Diagnosis not present

## 2016-06-13 ENCOUNTER — Telehealth (INDEPENDENT_AMBULATORY_CARE_PROVIDER_SITE_OTHER): Payer: Self-pay | Admitting: Orthopedic Surgery

## 2016-06-13 NOTE — Telephone Encounter (Signed)
Donita from [facility] called stating that no one has come to the door the last two times nor return her call. Patient will be discharged.  Contact InfoSuzan Slick, [facility] [phone]

## 2016-06-15 ENCOUNTER — Telehealth: Payer: Self-pay | Admitting: Internal Medicine

## 2016-06-15 ENCOUNTER — Emergency Department (HOSPITAL_COMMUNITY)
Admission: EM | Admit: 2016-06-15 | Discharge: 2016-06-18 | Disposition: E | Payer: Medicare Other | Attending: Emergency Medicine | Admitting: Emergency Medicine

## 2016-06-15 DIAGNOSIS — I10 Essential (primary) hypertension: Secondary | ICD-10-CM | POA: Insufficient documentation

## 2016-06-15 DIAGNOSIS — J45909 Unspecified asthma, uncomplicated: Secondary | ICD-10-CM | POA: Insufficient documentation

## 2016-06-15 DIAGNOSIS — Z7901 Long term (current) use of anticoagulants: Secondary | ICD-10-CM | POA: Insufficient documentation

## 2016-06-15 DIAGNOSIS — Z7984 Long term (current) use of oral hypoglycemic drugs: Secondary | ICD-10-CM | POA: Diagnosis not present

## 2016-06-15 DIAGNOSIS — I469 Cardiac arrest, cause unspecified: Secondary | ICD-10-CM

## 2016-06-15 DIAGNOSIS — E119 Type 2 diabetes mellitus without complications: Secondary | ICD-10-CM | POA: Insufficient documentation

## 2016-06-15 NOTE — Progress Notes (Signed)
   06/07/2016 2200  Clinical Encounter Type  Visited With Family;Health care provider  Visit Type ED;Death  Referral From Nurse  Spiritual Encounters  Spiritual Needs Grief support;Emotional;Prayer  Stress Factors  Family Stress Factors Loss  Ch called for post-CPR Death of pt; [place] met family in ED waiting area and escorted to consult; [place] escorted MD to notify family and Ch offered prayer, emotional, grief and spiritual support; [place] verified NOK; Patient placement card given to pt daughter. Gwynn Burly 10:30 PM

## 2016-06-15 NOTE — ED Notes (Signed)
IV, ET tube removed.  Placed in bag, tags applied and ready for transport to morgue.

## 2016-06-15 NOTE — ED Notes (Signed)
Patient arrived via EMS.  Witnessed arrest by family.  Patient received 3 Epi, Narcan 2mg  with pulses back.  Original strip was tachycardia, rearrested 3 times, paced, PEA, shocked 3 times for V Tach

## 2016-06-15 NOTE — ED Notes (Signed)
Dr Radford Pax called time of death.  Rectal temp 93.4

## 2016-06-15 NOTE — ED Provider Notes (Signed)
[facility]-EMERGENCY DEPT Provider Note   CSN: [id number] Arrival date & time: 05/22/2016  2155     History   Chief Complaint Cardiac arrest  HPI [name] is a 82 y.o. female.  Patietn presents by EMS after witnessed arrest by family 20 minutes prior to EMS arrival. Was initially in V tach, got defib x 1 and multiple rounds of epi. ROSC achieved initially and she was put on an epi drip, hypotensive with systolic in the 50s. She then became bradycardic and lost pulses again, CPR restarted. Received defib x 2 for V fib, also epinephrine. Received approximately 40 minutes resuscitation so far.   The history is provided by the EMS personnel and a relative. No language interpreter was used.  Cardiac Arrest  Witnessed by:  Family member Time since incident:  40 minutes Time before BLS initiated:  > 5 minutes Time before ALS initiated:  > 10 minutes Condition upon EMS arrival:  Unresponsive Pulse:  Absent Initial cardiac rhythm per EMS:  Ventricular tachycardia Treatments prior to arrival:  ACLS protocol and AED discharged Medications given prior to ED:  Amiodarone and epinephrine Defibrillation prior to arrival:  300 J Number of shocks delivered:  3 Defibrillation successful: no   Rhythm after defibrillation:  Asystole Airway: King airway. Rhythm on admission to ED:  Asystole Associated symptoms comment:  Unknown Risk factors: diabetes mellitus and heart problem   Risk factors: no trauma     Past Medical History:  Diagnosis Date  . Asthma   . Atrial fibrillation, permanent (HCC)    a. Dx 04/2013, on metoprolol, dig and xarelto  . Cataract    left eye  . CHF (congestive heart failure) (HCC)   . Chronic combined systolic and diastolic heart failure (HCC)    a. 02/2012 EF:20-25%  . Churg-Strauss syndrome (HCC)    a. Sural nerve biopsy 02/2000.  . Ectopic atrial tachycardia (HCC)    a. Documented on tele as IP 02/2012.  . Family history of adverse reaction to anesthesia    daughter had a headache and couldn't breath; thinks that she had a gas for anesthesia  . Glaucoma   . Grave's disease    a. s/p thyroidectomy.  . Hypercholesteremia   . Hypertension   . Hypokalemia    a. Previously felt due to diuretics, req supplementation.  . Lipoma    left inner thigh  . Multifocal atrial tachycardia (HCC)    a. Documented as OP 01/2012.  Marland Kitchen. NSVT (nonsustained ventricular tachycardia) (HCC)    a. During CHF adm 2001.  . On home oxygen therapy    "wear it mostly at night" (09/20/2014)  . Osteoarthritis    left shoulder  . Osteopenia    DEXA 2004  . Pericardial effusion    a. Echo 02/2012: small-mod pericardial effusion.  . Pleural effusion, left    a. Thoracentesis 2001 - per notes, no malignant cells, was transudative.  . Pneumonia 1930's  . Pseudogout   . Pulmonary HTN    a. Mod by echo 02/2012.  Marland Kitchen. Shortness of breath    With extertion  . Type II diabetes mellitus (HCC)   . Valvular heart disease    a. Echo 02/2012: mod MR/TR, mild AI.  Marland Kitchen. VASCULITIS   . Venous insufficiency     Patient Active Problem List   Diagnosis Date Noted  . Above knee amputation of right lower extremity (HCC) 05/23/2016  . Below knee amputation status (HCC) 04/30/2016  . Status post below knee amputation  of right lower extremity (HCC) 03/31/2016  . Itching 03/31/2016  . Osteomyelitis of right foot (HCC) 03/19/2016  . Acute osteomyelitis of right foot (HCC)   . MSSA (methicillin susceptible Staphylococcus aureus) infection   . Pressure ulcer 01/29/2016  . Diabetic foot infection (HCC) 01/27/2016  . Sepsis (HCC) 01/27/2016  . Staphylococcus aureus bacteremia with sepsis (HCC) 01/27/2016  . Fracture of right great toe 12/10/2015  . Acute exacerbation of CHF (congestive heart failure) (HCC) 09/18/2015  . Visual disturbance 08/23/2015  . PAD (peripheral artery disease) (HCC) 07/18/2015  . Chronic venous insufficiency 07/18/2015  . Acute upper respiratory infection 07/11/2015  .  Leg pain, left 05/28/2015  . Solitary pulmonary nodule 02/01/2015  . Atherosclerosis of aorta (HCC) 12/08/2014  . Abdominal wall hernia 11/02/2014  . Goals of care, counseling/discussion 10/10/2014  . Anemia, iron deficiency 10/10/2014  . Leukopenia 09/20/2014  . Debilitated patient 05/02/2014  . Pulmonary hypertension (HCC) 05/02/2014  . Current use of long term anticoagulation--xarelto 04/05/2014  . Thyroid mass 08/28/2013  . Severe protein-calorie malnutrition (HCC) 08/19/2013  . Pulmonary embolism (HCC) 08/18/2013  . Chronic combined systolic and diastolic congestive heart failure (HCC) 05/18/2013  . Chronic atrial fibrillation (HCC) 05/18/2013  . Asthma in adult 05/18/2013  . Valvular heart disease 05/18/2013  . Churg-Strauss syndrome (HCC) 05/18/2013  . GERD (gastroesophageal reflux disease) 05/27/2012  . Preventive measure 07/17/2011  . GRAVE'S DISEASE 07/27/2006  . HYPERCHOLESTEROLEMIA 07/27/2006  . Glaucoma 07/27/2006  . Essential hypertension 07/27/2006  . CARDIOMYOPATHY, DILATED 07/27/2006  . OSTEOPENIA 07/27/2006    Past Surgical History:  Procedure Laterality Date  . ABDOMINAL HYSTERECTOMY    . AMPUTATION Right 03/21/2016   Procedure: RIGHT BELOW KNEE AMPUTATION;  Surgeon: Nadara Mustard, MD;  Location: [facility] OR;  Service: Orthopedics;  Laterality: Right;  . AMPUTATION  04/30/2016    Revision Right Below Knee Amputation with application of prevena  . AMPUTATION Right 05/23/2016   Procedure: AMPUTATION ABOVE KNEE;  Surgeon: Nadara Mustard, MD;  Location: [facility] OR;  Service: Orthopedics;  Laterality: Right;  . BELOW KNEE LEG AMPUTATION Right 03/21/2016  . CATARACT EXTRACTION Right   . FRACTURE SURGERY    . HEMIARTHROPLASTY HIP Right 10/2010   cemented for hip fracture, femoral neck - by Eulas Post, MD  . PERIPHERAL VASCULAR CATHETERIZATION N/A 01/30/2016   Procedure: Abdominal Aortogram w/Lower Extremity;  Surgeon: Nada Libman, MD;  Location: [facility] INVASIVE CV LAB;   Service: Cardiovascular;  Laterality: N/A;  . PERIPHERAL VASCULAR CATHETERIZATION Right 01/30/2016   Procedure: Peripheral Vascular Balloon Angioplasty;  Surgeon: Nada Libman, MD;  Location: [facility] INVAS[facility] CV [facility];  Service: Cardiovascular;  Laterality: Right;  Posterior tibial  . SALPINGOOPHORECTOMY    . STUMP REVISION Right 04/30/2016   Procedure: Revision Right Below Knee Amputation with application of prevena;  Surgeon: Nadara Mustard, MD;  Location: [facility] OR;  Service: Orthopedics;  Laterality: Right;  . SURAL NERVE BIOPSY  02/2000  . THYROIDECTOMY      OB History    No data available       Home Medications    Prior to Admission medications   Medication Sig Start Date End Date Taking? Authorizing Provider  acetaminophen (TYLENOL) 500 MG tablet Take 1 tablet (500 mg total) by mouth every 6 (six) hours as needed for mild pain (or Fever >/= 101). Please do not take more than 6 tablets today. 03/24/16   Beather Arbour, MD  albuterol (PROVENTIL HFA;VENTOLIN HFA) 108 (90 Base) MCG/ACT  inhaler Inhale 2 puffs into the lungs every 6 (six) hours as needed for wheezing or shortness of breath. 09/18/15   Yolanda Manges, DO  BD ULTRA-FINE LANCETS lancets Use to check blood sugar one time daily Patient not taking: Reported on 05/23/2016 08/23/15   Jana Half, MD  brimonidine (ALPHAGAN P) 0.1 % SOLN Place 1 drop into the left eye every 12 (twelve) hours. 07/11/15   Darrick Huntsman, MD  cetirizine (ZYRTEC) 5 MG tablet Take 1 tablet (5 mg total) by mouth daily. Patient not taking: Reported on 05/23/2016 03/31/16   Camelia Phenes, DO  dorzolamide (TRUSOPT) 2 % ophthalmic solution Place 1 drop into the left eye 2 (two) times daily. 07/11/15   Darrick Huntsman, MD  DULERA 100-5 MCG/ACT AERO INHALE 2 PUFFS INTO THE LUNGS TWICE DAILY 04/11/16   Inez Catalina, MD  ferrous sulfate 325 (65 FE) MG tablet TAKE 1 TABLET(325 MG) BY MOUTH DAILY 07/11/15   Darrick Huntsman, MD  glucose blood test strip Use  as instructed Patient not taking: Reported on 05/23/2016 07/11/15   Darrick Huntsman, MD  HYDROcodone-acetaminophen (NORCO/VICODIN) 5-325 MG tablet Take 1-2 tablets by mouth every 4 (four) hours as needed (breakthrough pain). 05/25/16   Nadara Mustard, MD  lisinopril (PRINIVIL,ZESTRIL) 10 MG tablet TAKE 1 TABLET BY MOUTH DAILY 06/03/16   Gust Rung, DO  lisinopril (PRINIVIL,ZESTRIL) 20 MG tablet Take 20 mg by mouth daily. 05/04/16   Historical Provider, MD  metFORMIN (GLUCOPHAGE) 1000 MG tablet TAKE 1 TABLET(1000 MG) BY MOUTH TWICE DAILY WITH A MEAL 09/20/15   Yolanda Manges, DO  metoprolol succinate (TOPROL-XL) 100 MG 24 hr tablet Take 1 tablet daily Patient taking differently: Take 100 mg by mouth every morning.  02/03/16   Nishant Dhungel, MD  ondansetron (ZOFRAN) 4 MG tablet Take 1 tablet (4 mg total) by mouth every 8 (eight) hours as needed for nausea or vomiting. 04/20/16 04/20/17  Fuller Plan, MD  oxyCODONE-acetaminophen (ROXICET) 5-325 MG tablet Take 1 tablet by mouth every 4 (four) hours as needed for severe pain. Patient not taking: Reported on 05/23/2016 05/01/16   Nadara Mustard, MD  ranitidine (ZANTAC) 75 MG tablet Take 75 mg by mouth daily as needed for heartburn (or nausea).     Historical Provider, MD  spironolactone (ALDACTONE) 25 MG tablet TAKE 1 TABLET BY MOUTH EVERY DAY 03/05/16   Gust Rung, DO  traMADol (ULTRAM) 50 MG tablet Take 1 tablet (50 mg total) by mouth every 6 (six) hours as needed. Patient taking differently: Take 50 mg by mouth every 6 (six) hours as needed for moderate pain.  04/23/16 04/23/17  Arnetha Courser, MD  XARELTO 15 MG TABS tablet TAKE 1 TABLET BY MOUTH DAILY WITH SUPPER 06/03/16   Gust Rung, DO    Family History Family History  Problem Relation Age of Onset  . Diabetes Mother   . Diabetes Father   . Heart disease Neg Hx     Social History Social History  Substance Use Topics  . Smoking status: Never Smoker  . Smokeless tobacco: Never Used  .  Alcohol use No     Allergies   Amiodarone   Review of Systems Review of Systems  Unable to perform ROS: Patient unresponsive     Physical Exam Updated Vital Signs Wt 63.5 kg   BMI 21.93 kg/m   Physical Exam  Constitutional: She is uncooperative. She appears toxic. She appears ill.  Unresponsive, compressions in process. Intubated with [facility] airway.  HENT:  Head: Atraumatic.  Eyes:  Unable to assess, CPR in progress  Cardiovascular:  No palpable femoral pulses  Pulmonary/Chest:  No spontaneous respiratory effort  Neurological: She is unresponsive.  Skin: Skin is dry.     ED Treatments / Results  Labs (all labs ordered are listed, but only abnormal results are displayed) Labs Reviewed - No data to display  EKG  EKG Interpretation None       Radiology No results found.  Procedures Procedures (including critical care time)  Medications Ordered in ED Medications - No data to display   Initial Impression / Assessment and Plan / ED Course  I have reviewed the triage vital signs and the nursing notes.  Pertinent labs & imaging results that were available during my care of the patient were reviewed by me and considered in my medical decision making (see chart for details).  Clinical Course    Patient presents after witnessed cardiac arrest by family. ROSC achieved temporarily by EMS after epinephrine and defib x 1, then pulses lost again prior to arrival to the Ed. 40 total minutes of resuscitation. On arrival patient has no pulses, only minimal cardiac activity on bedside ultrasound with bradycardia to 20s-30s, only minimal squeeze, not compatible with circulation. Given patient's age and length of time of resuscitation without adequate cardiac activity, CPR was not restarted. Time of death was called at 9:56 PM.  Final Clinical Impressions(s) / ED Diagnoses   Final diagnoses:  Cardiac arrest [facility])    New Prescriptions New Prescriptions   No  medications on file     Preston Fleeting, MD 06/03/2016 2308    Nelva Nay, MD 07/16/16 [phone]

## 2016-06-15 NOTE — Telephone Encounter (Signed)
   Reason for call:   I received a call from Ms. Tiersa Esselman's daugther at [employer] [facility] indicating that Ms. Schave has had a poor appetite for the past several weeks. She had a right BKA a few weeks ago. Since then she has not been eating much. Daughter reports she tried to give her soup today and she threw it up. This was the first instance of emesis. No blood or bile. She has been eating/drinking boost/ensure. The wound appears to be healing well with no signs of infection per daughter. No fevers or chills.    Pertinent Data:   Poor appetite since right BKA 2 weeks ago   Assessment / Plan / Recommendations:   Recommended follow up in clinic for further evaluation  As always, pt is advised that if symptoms worsen or new symptoms arise, they should go to an urgent care facility or to to ER for further evaluation.   Valentino Nose, MD   18-Jun-2016, 7:06 PM

## 2016-06-15 NOTE — ED Notes (Signed)
Patient without resp or heartbeat.  Unable to obtain BP  Patient cool to touch

## 2016-06-16 DIAGNOSIS — I469 Cardiac arrest, cause unspecified: Secondary | ICD-10-CM | POA: Diagnosis not present

## 2016-06-18 NOTE — ED Notes (Signed)
Patient transported to morgue.

## 2016-06-18 DEATH — deceased

## 2016-06-19 ENCOUNTER — Ambulatory Visit (INDEPENDENT_AMBULATORY_CARE_PROVIDER_SITE_OTHER): Payer: Medicare Other | Admitting: Orthopedic Surgery

## 2016-06-20 NOTE — Telephone Encounter (Signed)
Patient was contacted with Frank Tillman, PharmD candidate. I agree with the assessment and plan of care documented.  

## 2016-06-26 DIAGNOSIS — I509 Heart failure, unspecified: Secondary | ICD-10-CM | POA: Diagnosis not present

## 2016-07-07 ENCOUNTER — Encounter (HOSPITAL_COMMUNITY): Payer: Self-pay | Admitting: Orthopedic Surgery

## 2016-07-07 NOTE — Addendum Note (Signed)
Addendum  created 07/07/16 1403 by Adair Laundry, CRNA   Anesthesia Event deleted
